# Patient Record
Sex: Male | Born: 1950
Health system: Southern US, Community
[De-identification: ages and names within clinical notes are randomized; demographics above are authoritative.]

## PROBLEM LIST (undated history)

## (undated) ENCOUNTER — Emergency Department (HOSPITAL_COMMUNITY): Admission: EM | Payer: No Typology Code available for payment source | Source: Home / Self Care

## (undated) DIAGNOSIS — Z860100 Personal history of colon polyps, unspecified: Secondary | ICD-10-CM

## (undated) DIAGNOSIS — H269 Unspecified cataract: Secondary | ICD-10-CM

## (undated) DIAGNOSIS — F32A Depression, unspecified: Secondary | ICD-10-CM

## (undated) DIAGNOSIS — R609 Edema, unspecified: Secondary | ICD-10-CM

## (undated) DIAGNOSIS — E785 Hyperlipidemia, unspecified: Secondary | ICD-10-CM

## (undated) DIAGNOSIS — F419 Anxiety disorder, unspecified: Secondary | ICD-10-CM

## (undated) DIAGNOSIS — I359 Nonrheumatic aortic valve disorder, unspecified: Secondary | ICD-10-CM

## (undated) DIAGNOSIS — J449 Chronic obstructive pulmonary disease, unspecified: Secondary | ICD-10-CM

## (undated) DIAGNOSIS — Z87442 Personal history of urinary calculi: Secondary | ICD-10-CM

## (undated) DIAGNOSIS — K219 Gastro-esophageal reflux disease without esophagitis: Secondary | ICD-10-CM

## (undated) DIAGNOSIS — E119 Type 2 diabetes mellitus without complications: Secondary | ICD-10-CM

## (undated) DIAGNOSIS — R0600 Dyspnea, unspecified: Secondary | ICD-10-CM

## (undated) DIAGNOSIS — M1990 Unspecified osteoarthritis, unspecified site: Secondary | ICD-10-CM

## (undated) DIAGNOSIS — N183 Chronic kidney disease, stage 3 unspecified: Secondary | ICD-10-CM

## (undated) DIAGNOSIS — G20 Parkinson's disease: Secondary | ICD-10-CM

## (undated) DIAGNOSIS — E039 Hypothyroidism, unspecified: Secondary | ICD-10-CM

## (undated) DIAGNOSIS — R0902 Hypoxemia: Secondary | ICD-10-CM

## (undated) DIAGNOSIS — G4733 Obstructive sleep apnea (adult) (pediatric): Secondary | ICD-10-CM

## (undated) DIAGNOSIS — K7460 Unspecified cirrhosis of liver: Secondary | ICD-10-CM

## (undated) DIAGNOSIS — I10 Essential (primary) hypertension: Secondary | ICD-10-CM

## (undated) DIAGNOSIS — G4730 Sleep apnea, unspecified: Secondary | ICD-10-CM

## (undated) DIAGNOSIS — I639 Cerebral infarction, unspecified: Secondary | ICD-10-CM

## (undated) DIAGNOSIS — G709 Myoneural disorder, unspecified: Secondary | ICD-10-CM

## (undated) DIAGNOSIS — R011 Cardiac murmur, unspecified: Secondary | ICD-10-CM

## (undated) DIAGNOSIS — H81399 Other peripheral vertigo, unspecified ear: Secondary | ICD-10-CM

## (undated) DIAGNOSIS — C44509 Unspecified malignant neoplasm of skin of other part of trunk: Secondary | ICD-10-CM

## (undated) DIAGNOSIS — T7840XA Allergy, unspecified, initial encounter: Secondary | ICD-10-CM

## (undated) DIAGNOSIS — G20C Parkinsonism, unspecified: Secondary | ICD-10-CM

## (undated) DIAGNOSIS — D469 Myelodysplastic syndrome, unspecified: Secondary | ICD-10-CM

## (undated) DIAGNOSIS — R600 Localized edema: Secondary | ICD-10-CM

## (undated) DIAGNOSIS — J45909 Unspecified asthma, uncomplicated: Secondary | ICD-10-CM

## (undated) HISTORY — DX: Depression, unspecified: F32.A

## (undated) HISTORY — DX: Unspecified asthma, uncomplicated: J45.909

## (undated) HISTORY — DX: Nonrheumatic aortic valve disorder, unspecified: I35.9

## (undated) HISTORY — DX: Hypothyroidism, unspecified: E03.9

## (undated) HISTORY — DX: Anxiety disorder, unspecified: F41.9

## (undated) HISTORY — DX: Essential (primary) hypertension: I10

## (undated) HISTORY — DX: Hypoxemia: R09.02

## (undated) HISTORY — PX: CHOLECYSTECTOMY: SHX55

## (undated) HISTORY — PX: ANKLE SURGERY: SHX546

## (undated) HISTORY — DX: Parkinsonism, unspecified: G20.C

## (undated) HISTORY — PX: EYE SURGERY: SHX253

## (undated) HISTORY — DX: Unspecified malignant neoplasm of skin of other part of trunk: C44.509

## (undated) HISTORY — DX: Unspecified cirrhosis of liver: K74.60

## (undated) HISTORY — DX: Type 2 diabetes mellitus without complications: E11.9

## (undated) HISTORY — DX: Gastro-esophageal reflux disease without esophagitis: K21.9

## (undated) HISTORY — DX: Sleep apnea, unspecified: G47.30

## (undated) HISTORY — PX: KIDNEY STONE SURGERY: SHX686

## (undated) HISTORY — PX: WISDOM TOOTH EXTRACTION: SHX21

## (undated) HISTORY — DX: Other peripheral vertigo, unspecified ear: H81.399

## (undated) HISTORY — DX: Hyperlipidemia, unspecified: E78.5

## (undated) HISTORY — DX: Personal history of colonic polyps: Z86.010

## (undated) HISTORY — DX: Myelodysplastic syndrome, unspecified: D46.9

## (undated) HISTORY — DX: Obstructive sleep apnea (adult) (pediatric): G47.33

## (undated) HISTORY — DX: Chronic kidney disease, stage 3 unspecified: N18.30

## (undated) HISTORY — DX: Parkinson's disease: G20

## (undated) HISTORY — DX: Myoneural disorder, unspecified: G70.9

## (undated) HISTORY — DX: Unspecified osteoarthritis, unspecified site: M19.90

## (undated) HISTORY — DX: Unspecified cataract: H26.9

## (undated) HISTORY — DX: Personal history of colon polyps, unspecified: Z86.0100

## (undated) HISTORY — DX: Allergy, unspecified, initial encounter: T78.40XA

## (undated) HISTORY — PX: HERNIA REPAIR: SHX51

---

## 2003-11-29 ENCOUNTER — Ambulatory Visit (HOSPITAL_COMMUNITY): Admission: RE | Admit: 2003-11-29 | Discharge: 2003-11-29 | Payer: Self-pay | Admitting: Urology

## 2003-11-29 ENCOUNTER — Ambulatory Visit (HOSPITAL_BASED_OUTPATIENT_CLINIC_OR_DEPARTMENT_OTHER): Admission: RE | Admit: 2003-11-29 | Discharge: 2003-11-29 | Payer: Self-pay | Admitting: Urology

## 2003-12-04 ENCOUNTER — Ambulatory Visit (HOSPITAL_COMMUNITY): Admission: RE | Admit: 2003-12-04 | Discharge: 2003-12-04 | Payer: Self-pay | Admitting: Urology

## 2003-12-11 ENCOUNTER — Ambulatory Visit (HOSPITAL_COMMUNITY): Admission: RE | Admit: 2003-12-11 | Discharge: 2003-12-11 | Payer: Self-pay | Admitting: Urology

## 2005-11-20 IMAGING — XA IR US GUIDANCE
1 series · 7 of 7 positions shown · non-contrast
Comparison: none

CLINICAL DATA: 53 year old male with left ureteral obstruction resulting in hydronephrosis.  The patient has a history of previous stricture related to multiple stones.  Urology was unable to place a retrograde ureteral stent.
ULTRASOUND AND FLUOROSCOPICALLY GUIDED LEFT PERCUTANEOUS NEPHROSTOMY ? 11/29/03 
RADIOLOGIST:  Nation, Jovado.   
GUIDANCE:  Ultrasound and fluoroscopic.
COMPLICATIONS:  No immediate complications.
MEDICATIONS:  400 mg Ciprofloxacin IV, Versed and fentanyl IV sedation.
PROCEDURE/FINDINGS:
Informed consent was obtained from the patient.   Review of the recent CT from March 2003 was performed.  This demonstrates a small kidney with cortical atrophy.  At the time of CT, a ureteral stent was in place.  
The patient was positioned prone.  Limited ultrasound was performed of the left kidney demonstrating a hydronephrotic kidney with cortical atrophy and a small size.  The kidney is actually fairly lateral in the left retroperitoneum.  From a lateral posterior flank approach, a 22 gauge Chiba needle was utilized to puncture a dilated posterior lower pole calix.  Needle position was confirmed with ultrasound.  Contrast was injected to confirm position under fluoroscopy.  An 018 guidewire was advanced followed by an Accu-Stick transitional dilator.  The outer dilator allowed insertion of a 035 J guidewire.  Serial dilatation was performed to accept a 10 French pigtail drainage catheter formed in the renal pelvis.  The left urinary system was decompressed.  Contrast was injected to confirm position.  
During placement, the catheter was noted to overlie an air-distended left colon.  However, in the lateral projection, the catheter is posterior to the colon.  
IMPRESSION
1.  Uncomplicated left percutaneous nephrostomy catheter for obstructive hydronephrosis with insertion of a 10 French drain.
PLAN:  The patient will be observed for one hour and if remains stable discharged.

[Series 1000: run · 0.16mm/px · 7 of 7 slices shown]
[im 1/7]
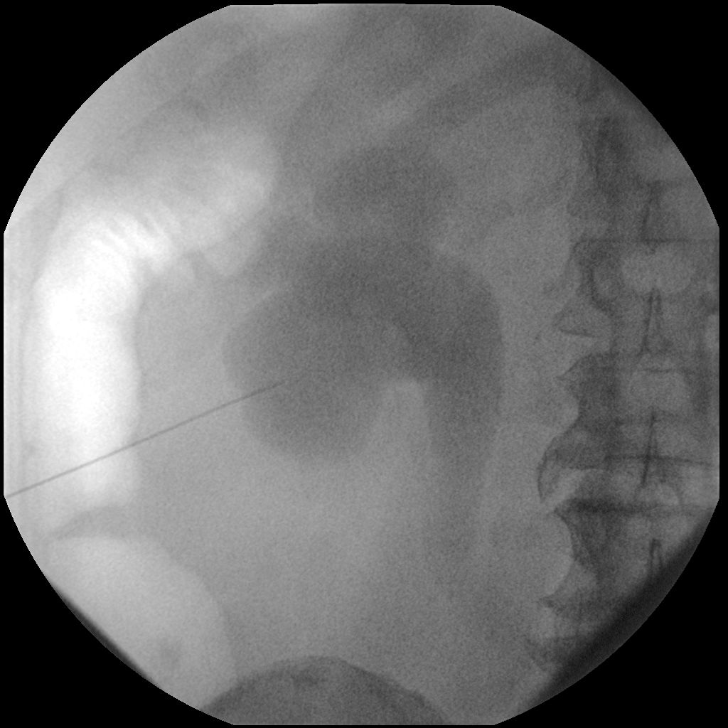
[im 2/7]
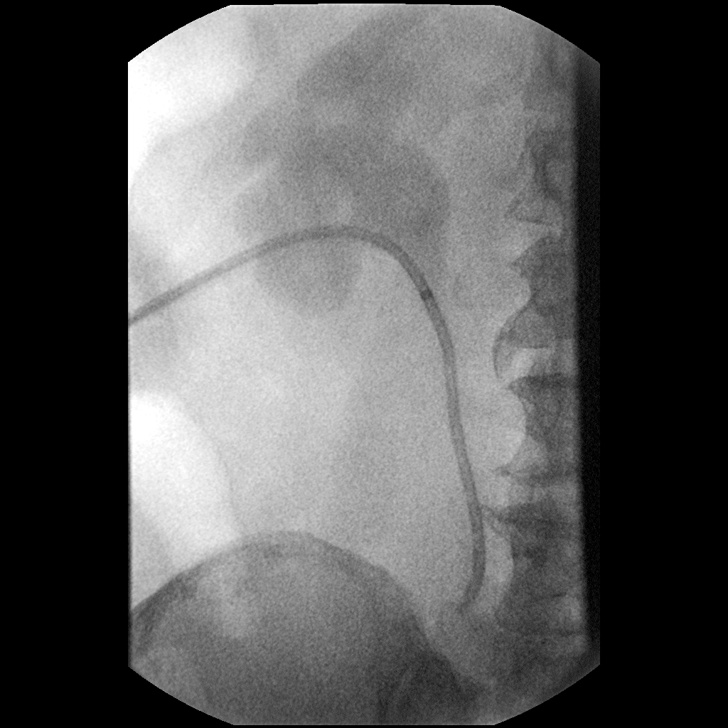
[im 3/7]
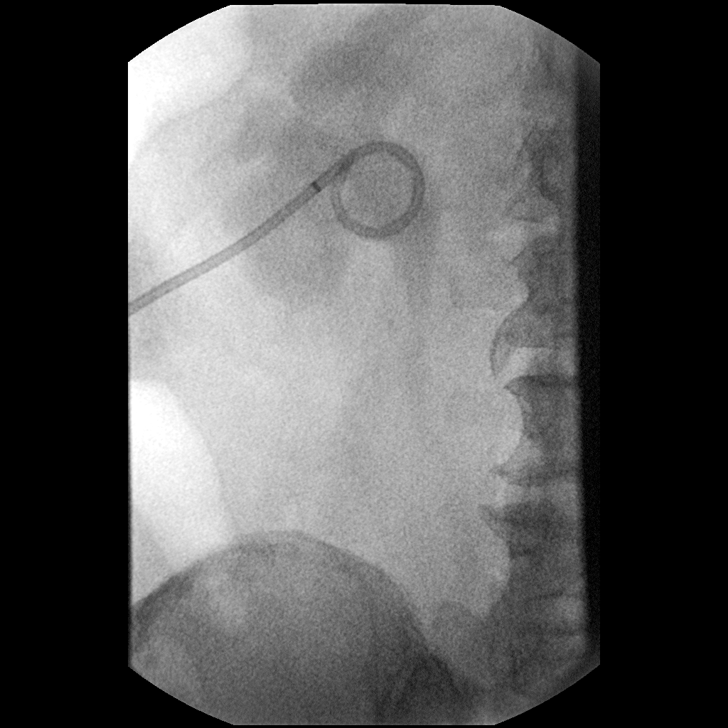
[im 4/7]
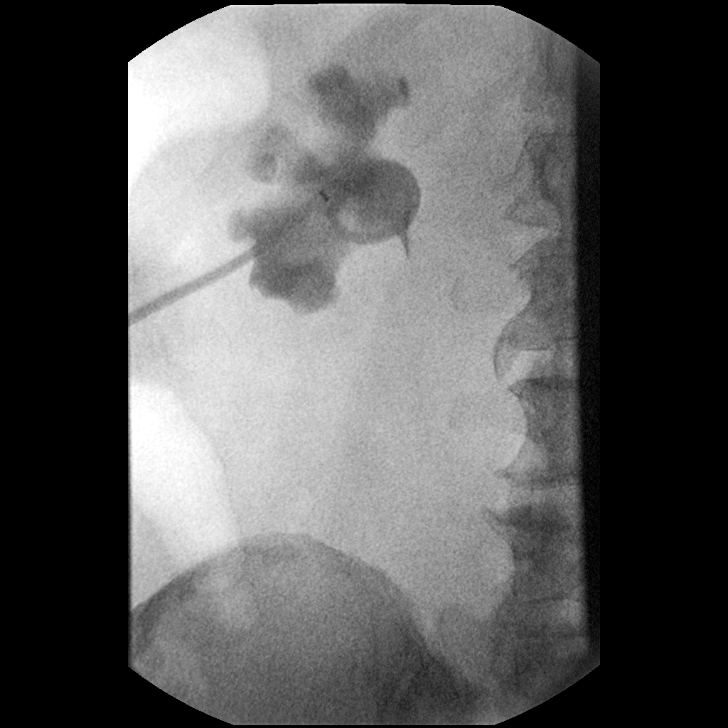
[im 5/7]
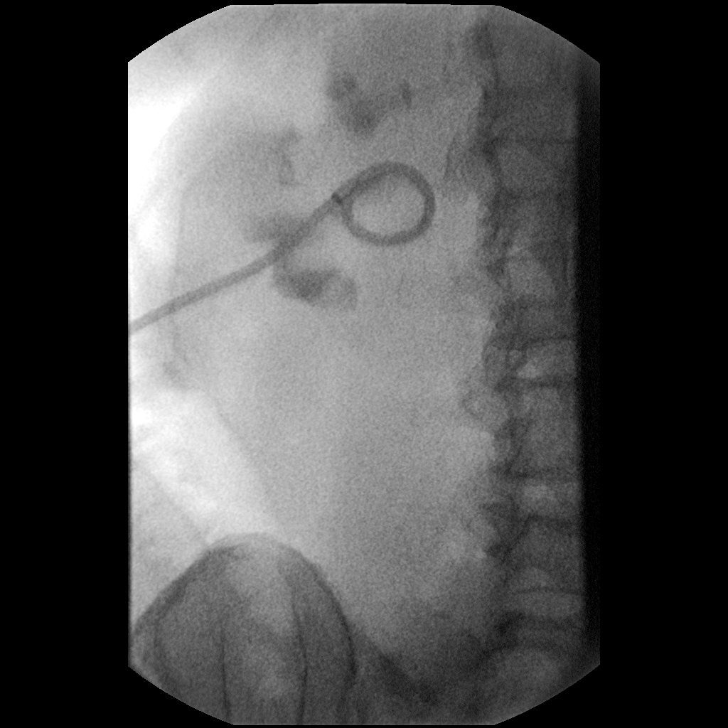
[im 6/7]
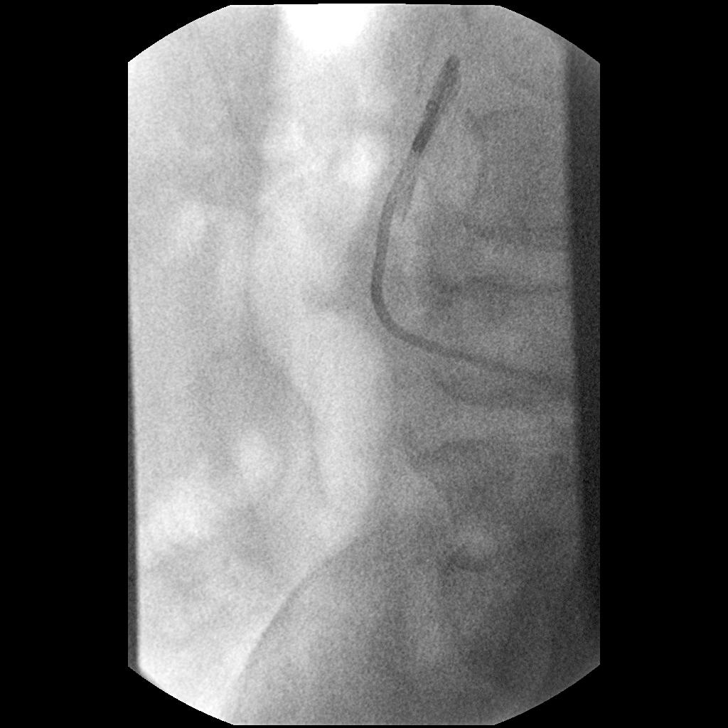
[im 7/7]
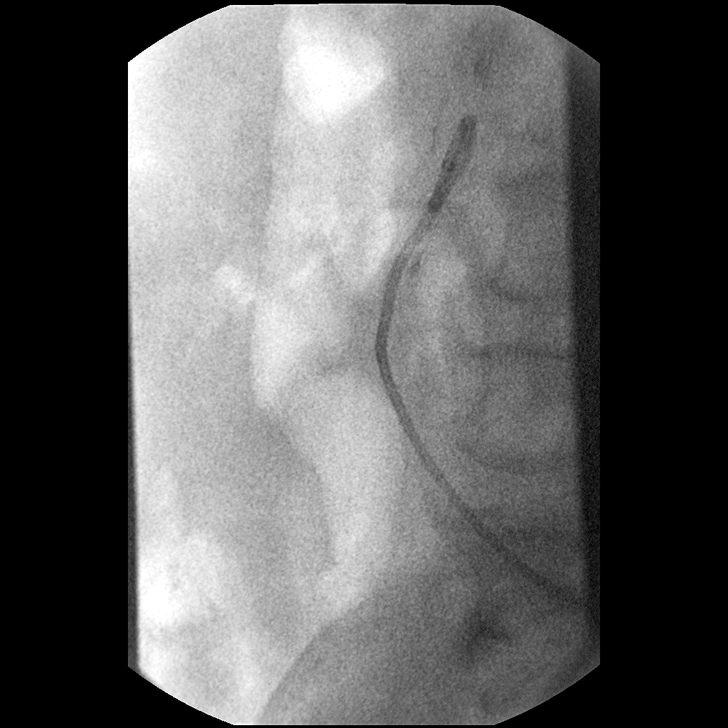

[7 of 7 positions shown; findings below may reference images not displayed]

## 2015-11-24 DIAGNOSIS — H5352 Acquired color vision deficiency: Secondary | ICD-10-CM | POA: Insufficient documentation

## 2015-11-24 DIAGNOSIS — H2511 Age-related nuclear cataract, right eye: Secondary | ICD-10-CM | POA: Insufficient documentation

## 2015-11-24 DIAGNOSIS — E119 Type 2 diabetes mellitus without complications: Secondary | ICD-10-CM | POA: Insufficient documentation

## 2015-11-24 DIAGNOSIS — E1122 Type 2 diabetes mellitus with diabetic chronic kidney disease: Secondary | ICD-10-CM | POA: Insufficient documentation

## 2015-11-24 DIAGNOSIS — Z794 Long term (current) use of insulin: Secondary | ICD-10-CM | POA: Insufficient documentation

## 2015-11-24 DIAGNOSIS — H40023 Open angle with borderline findings, high risk, bilateral: Secondary | ICD-10-CM | POA: Insufficient documentation

## 2016-01-13 DIAGNOSIS — H2181 Floppy iris syndrome: Secondary | ICD-10-CM | POA: Insufficient documentation

## 2016-01-13 DIAGNOSIS — H5703 Miosis: Secondary | ICD-10-CM | POA: Insufficient documentation

## 2016-09-15 DIAGNOSIS — Z961 Presence of intraocular lens: Secondary | ICD-10-CM | POA: Insufficient documentation

## 2016-09-15 DIAGNOSIS — H2511 Age-related nuclear cataract, right eye: Secondary | ICD-10-CM | POA: Insufficient documentation

## 2017-09-26 DIAGNOSIS — T670XXA Heatstroke and sunstroke, initial encounter: Secondary | ICD-10-CM | POA: Diagnosis not present

## 2017-09-27 DIAGNOSIS — D709 Neutropenia, unspecified: Secondary | ICD-10-CM | POA: Diagnosis not present

## 2017-09-27 DIAGNOSIS — R509 Fever, unspecified: Secondary | ICD-10-CM | POA: Diagnosis not present

## 2017-09-27 DIAGNOSIS — N12 Tubulo-interstitial nephritis, not specified as acute or chronic: Secondary | ICD-10-CM

## 2017-09-27 DIAGNOSIS — N1330 Unspecified hydronephrosis: Secondary | ICD-10-CM

## 2017-09-27 DIAGNOSIS — G250 Essential tremor: Secondary | ICD-10-CM

## 2017-09-27 DIAGNOSIS — R55 Syncope and collapse: Secondary | ICD-10-CM | POA: Diagnosis not present

## 2017-09-27 DIAGNOSIS — T670XXA Heatstroke and sunstroke, initial encounter: Secondary | ICD-10-CM | POA: Diagnosis not present

## 2017-09-27 DIAGNOSIS — G4733 Obstructive sleep apnea (adult) (pediatric): Secondary | ICD-10-CM

## 2017-09-28 DIAGNOSIS — R55 Syncope and collapse: Secondary | ICD-10-CM | POA: Diagnosis not present

## 2017-09-28 DIAGNOSIS — T670XXA Heatstroke and sunstroke, initial encounter: Secondary | ICD-10-CM | POA: Diagnosis not present

## 2017-09-28 DIAGNOSIS — R509 Fever, unspecified: Secondary | ICD-10-CM | POA: Diagnosis not present

## 2017-09-29 DIAGNOSIS — R55 Syncope and collapse: Secondary | ICD-10-CM | POA: Diagnosis not present

## 2017-09-29 DIAGNOSIS — R509 Fever, unspecified: Secondary | ICD-10-CM | POA: Diagnosis not present

## 2017-09-29 DIAGNOSIS — T670XXA Heatstroke and sunstroke, initial encounter: Secondary | ICD-10-CM | POA: Diagnosis not present

## 2017-09-30 DIAGNOSIS — D61818 Other pancytopenia: Secondary | ICD-10-CM | POA: Diagnosis not present

## 2017-09-30 DIAGNOSIS — R509 Fever, unspecified: Secondary | ICD-10-CM | POA: Diagnosis not present

## 2017-09-30 DIAGNOSIS — R161 Splenomegaly, not elsewhere classified: Secondary | ICD-10-CM

## 2017-09-30 DIAGNOSIS — D469 Myelodysplastic syndrome, unspecified: Secondary | ICD-10-CM | POA: Diagnosis not present

## 2017-09-30 DIAGNOSIS — N189 Chronic kidney disease, unspecified: Secondary | ICD-10-CM | POA: Diagnosis not present

## 2017-09-30 DIAGNOSIS — R55 Syncope and collapse: Secondary | ICD-10-CM | POA: Diagnosis not present

## 2017-09-30 DIAGNOSIS — R1012 Left upper quadrant pain: Secondary | ICD-10-CM

## 2017-09-30 DIAGNOSIS — T670XXA Heatstroke and sunstroke, initial encounter: Secondary | ICD-10-CM | POA: Diagnosis not present

## 2017-09-30 DIAGNOSIS — D709 Neutropenia, unspecified: Secondary | ICD-10-CM | POA: Diagnosis not present

## 2017-10-01 DIAGNOSIS — T670XXA Heatstroke and sunstroke, initial encounter: Secondary | ICD-10-CM | POA: Diagnosis not present

## 2017-10-01 DIAGNOSIS — N189 Chronic kidney disease, unspecified: Secondary | ICD-10-CM

## 2017-10-01 DIAGNOSIS — R55 Syncope and collapse: Secondary | ICD-10-CM | POA: Diagnosis not present

## 2017-10-01 DIAGNOSIS — E871 Hypo-osmolality and hyponatremia: Secondary | ICD-10-CM

## 2017-10-01 DIAGNOSIS — N12 Tubulo-interstitial nephritis, not specified as acute or chronic: Secondary | ICD-10-CM | POA: Diagnosis not present

## 2017-10-01 DIAGNOSIS — R509 Fever, unspecified: Secondary | ICD-10-CM | POA: Diagnosis not present

## 2017-10-01 DIAGNOSIS — D709 Neutropenia, unspecified: Secondary | ICD-10-CM | POA: Diagnosis not present

## 2017-10-01 DIAGNOSIS — N1330 Unspecified hydronephrosis: Secondary | ICD-10-CM | POA: Diagnosis not present

## 2017-10-01 DIAGNOSIS — D61818 Other pancytopenia: Secondary | ICD-10-CM | POA: Diagnosis not present

## 2017-10-01 DIAGNOSIS — D469 Myelodysplastic syndrome, unspecified: Secondary | ICD-10-CM | POA: Diagnosis not present

## 2017-10-02 DIAGNOSIS — R509 Fever, unspecified: Secondary | ICD-10-CM | POA: Diagnosis not present

## 2017-10-02 DIAGNOSIS — D61818 Other pancytopenia: Secondary | ICD-10-CM | POA: Diagnosis not present

## 2017-10-02 DIAGNOSIS — R55 Syncope and collapse: Secondary | ICD-10-CM | POA: Diagnosis not present

## 2017-10-02 DIAGNOSIS — T670XXA Heatstroke and sunstroke, initial encounter: Secondary | ICD-10-CM | POA: Diagnosis not present

## 2017-10-02 DIAGNOSIS — D469 Myelodysplastic syndrome, unspecified: Secondary | ICD-10-CM | POA: Diagnosis not present

## 2017-10-02 DIAGNOSIS — N12 Tubulo-interstitial nephritis, not specified as acute or chronic: Secondary | ICD-10-CM | POA: Diagnosis not present

## 2017-10-02 DIAGNOSIS — D696 Thrombocytopenia, unspecified: Secondary | ICD-10-CM | POA: Diagnosis not present

## 2017-10-02 DIAGNOSIS — D709 Neutropenia, unspecified: Secondary | ICD-10-CM | POA: Diagnosis not present

## 2017-10-03 DIAGNOSIS — D709 Neutropenia, unspecified: Secondary | ICD-10-CM | POA: Diagnosis not present

## 2017-10-03 DIAGNOSIS — T670XXA Heatstroke and sunstroke, initial encounter: Secondary | ICD-10-CM | POA: Diagnosis not present

## 2017-10-03 DIAGNOSIS — R509 Fever, unspecified: Secondary | ICD-10-CM | POA: Diagnosis not present

## 2017-10-03 DIAGNOSIS — R55 Syncope and collapse: Secondary | ICD-10-CM | POA: Diagnosis not present

## 2017-10-04 DIAGNOSIS — D61810 Antineoplastic chemotherapy induced pancytopenia: Secondary | ICD-10-CM | POA: Diagnosis not present

## 2017-10-04 DIAGNOSIS — D709 Neutropenia, unspecified: Secondary | ICD-10-CM | POA: Diagnosis not present

## 2017-10-04 DIAGNOSIS — T670XXA Heatstroke and sunstroke, initial encounter: Secondary | ICD-10-CM | POA: Diagnosis not present

## 2017-10-04 DIAGNOSIS — R55 Syncope and collapse: Secondary | ICD-10-CM | POA: Diagnosis not present

## 2017-10-04 DIAGNOSIS — D469 Myelodysplastic syndrome, unspecified: Secondary | ICD-10-CM | POA: Diagnosis not present

## 2017-10-04 DIAGNOSIS — R509 Fever, unspecified: Secondary | ICD-10-CM | POA: Diagnosis not present

## 2018-10-02 DIAGNOSIS — R079 Chest pain, unspecified: Secondary | ICD-10-CM | POA: Diagnosis not present

## 2018-10-02 DIAGNOSIS — I10 Essential (primary) hypertension: Secondary | ICD-10-CM | POA: Diagnosis not present

## 2018-10-02 DIAGNOSIS — E119 Type 2 diabetes mellitus without complications: Secondary | ICD-10-CM | POA: Diagnosis not present

## 2018-10-02 DIAGNOSIS — K219 Gastro-esophageal reflux disease without esophagitis: Secondary | ICD-10-CM | POA: Diagnosis not present

## 2018-10-03 DIAGNOSIS — E119 Type 2 diabetes mellitus without complications: Secondary | ICD-10-CM | POA: Diagnosis not present

## 2018-10-03 DIAGNOSIS — K219 Gastro-esophageal reflux disease without esophagitis: Secondary | ICD-10-CM | POA: Diagnosis not present

## 2018-10-03 DIAGNOSIS — R079 Chest pain, unspecified: Secondary | ICD-10-CM | POA: Diagnosis not present

## 2018-10-03 DIAGNOSIS — I10 Essential (primary) hypertension: Secondary | ICD-10-CM | POA: Diagnosis not present

## 2019-09-26 DIAGNOSIS — N1339 Other hydronephrosis: Secondary | ICD-10-CM | POA: Diagnosis not present

## 2019-09-26 DIAGNOSIS — K862 Cyst of pancreas: Secondary | ICD-10-CM | POA: Diagnosis not present

## 2019-09-26 DIAGNOSIS — K828 Other specified diseases of gallbladder: Secondary | ICD-10-CM | POA: Diagnosis not present

## 2019-09-26 DIAGNOSIS — A419 Sepsis, unspecified organism: Secondary | ICD-10-CM | POA: Diagnosis not present

## 2019-09-26 DIAGNOSIS — R6520 Severe sepsis without septic shock: Secondary | ICD-10-CM | POA: Diagnosis not present

## 2019-09-26 DIAGNOSIS — R945 Abnormal results of liver function studies: Secondary | ICD-10-CM | POA: Diagnosis not present

## 2019-09-26 DIAGNOSIS — N179 Acute kidney failure, unspecified: Secondary | ICD-10-CM | POA: Diagnosis not present

## 2019-09-26 DIAGNOSIS — R197 Diarrhea, unspecified: Secondary | ICD-10-CM | POA: Diagnosis not present

## 2019-09-26 DIAGNOSIS — K859 Acute pancreatitis without necrosis or infection, unspecified: Secondary | ICD-10-CM | POA: Diagnosis not present

## 2019-09-26 DIAGNOSIS — N19 Unspecified kidney failure: Secondary | ICD-10-CM | POA: Diagnosis not present

## 2019-09-26 DIAGNOSIS — R509 Fever, unspecified: Secondary | ICD-10-CM | POA: Diagnosis not present

## 2019-09-26 DIAGNOSIS — N132 Hydronephrosis with renal and ureteral calculous obstruction: Secondary | ICD-10-CM | POA: Diagnosis not present

## 2019-09-26 DIAGNOSIS — K8020 Calculus of gallbladder without cholecystitis without obstruction: Secondary | ICD-10-CM | POA: Diagnosis not present

## 2019-09-26 DIAGNOSIS — K8689 Other specified diseases of pancreas: Secondary | ICD-10-CM | POA: Diagnosis not present

## 2019-09-26 DIAGNOSIS — K858 Other acute pancreatitis without necrosis or infection: Secondary | ICD-10-CM | POA: Diagnosis not present

## 2019-09-27 DIAGNOSIS — D61818 Other pancytopenia: Secondary | ICD-10-CM | POA: Diagnosis not present

## 2019-09-27 DIAGNOSIS — K8309 Other cholangitis: Secondary | ICD-10-CM | POA: Diagnosis not present

## 2019-09-27 DIAGNOSIS — E860 Dehydration: Secondary | ICD-10-CM | POA: Diagnosis not present

## 2019-09-27 DIAGNOSIS — K859 Acute pancreatitis without necrosis or infection, unspecified: Secondary | ICD-10-CM | POA: Diagnosis not present

## 2019-09-27 DIAGNOSIS — A419 Sepsis, unspecified organism: Secondary | ICD-10-CM | POA: Diagnosis not present

## 2019-09-28 DIAGNOSIS — I129 Hypertensive chronic kidney disease with stage 1 through stage 4 chronic kidney disease, or unspecified chronic kidney disease: Secondary | ICD-10-CM | POA: Diagnosis not present

## 2019-09-28 DIAGNOSIS — N189 Chronic kidney disease, unspecified: Secondary | ICD-10-CM | POA: Diagnosis not present

## 2019-09-28 DIAGNOSIS — F17210 Nicotine dependence, cigarettes, uncomplicated: Secondary | ICD-10-CM | POA: Diagnosis not present

## 2019-09-28 DIAGNOSIS — N1330 Unspecified hydronephrosis: Secondary | ICD-10-CM | POA: Diagnosis not present

## 2019-09-28 DIAGNOSIS — R748 Abnormal levels of other serum enzymes: Secondary | ICD-10-CM | POA: Diagnosis not present

## 2019-09-28 DIAGNOSIS — K7460 Unspecified cirrhosis of liver: Secondary | ICD-10-CM | POA: Diagnosis not present

## 2019-09-28 DIAGNOSIS — N179 Acute kidney failure, unspecified: Secondary | ICD-10-CM | POA: Diagnosis not present

## 2019-09-28 DIAGNOSIS — K8020 Calculus of gallbladder without cholecystitis without obstruction: Secondary | ICD-10-CM | POA: Diagnosis not present

## 2019-09-28 DIAGNOSIS — K766 Portal hypertension: Secondary | ICD-10-CM | POA: Diagnosis not present

## 2019-09-28 DIAGNOSIS — Z743 Need for continuous supervision: Secondary | ICD-10-CM | POA: Diagnosis not present

## 2019-09-28 DIAGNOSIS — E785 Hyperlipidemia, unspecified: Secondary | ICD-10-CM | POA: Diagnosis not present

## 2019-09-28 DIAGNOSIS — K8592 Acute pancreatitis with infected necrosis, unspecified: Secondary | ICD-10-CM | POA: Diagnosis not present

## 2019-09-28 DIAGNOSIS — K851 Biliary acute pancreatitis without necrosis or infection: Secondary | ICD-10-CM | POA: Diagnosis not present

## 2019-09-28 DIAGNOSIS — K859 Acute pancreatitis without necrosis or infection, unspecified: Secondary | ICD-10-CM | POA: Diagnosis not present

## 2019-09-28 DIAGNOSIS — D61818 Other pancytopenia: Secondary | ICD-10-CM | POA: Diagnosis not present

## 2019-09-28 DIAGNOSIS — K8030 Calculus of bile duct with cholangitis, unspecified, without obstruction: Secondary | ICD-10-CM | POA: Diagnosis not present

## 2019-09-28 DIAGNOSIS — N132 Hydronephrosis with renal and ureteral calculous obstruction: Secondary | ICD-10-CM | POA: Diagnosis not present

## 2019-09-28 DIAGNOSIS — K8309 Other cholangitis: Secondary | ICD-10-CM | POA: Diagnosis not present

## 2019-10-23 DIAGNOSIS — N132 Hydronephrosis with renal and ureteral calculous obstruction: Secondary | ICD-10-CM | POA: Diagnosis not present

## 2019-10-23 DIAGNOSIS — K851 Biliary acute pancreatitis without necrosis or infection: Secondary | ICD-10-CM | POA: Diagnosis not present

## 2019-10-26 DIAGNOSIS — K859 Acute pancreatitis without necrosis or infection, unspecified: Secondary | ICD-10-CM | POA: Diagnosis not present

## 2019-10-26 DIAGNOSIS — K851 Biliary acute pancreatitis without necrosis or infection: Secondary | ICD-10-CM | POA: Diagnosis not present

## 2019-10-26 DIAGNOSIS — K7460 Unspecified cirrhosis of liver: Secondary | ICD-10-CM | POA: Diagnosis not present

## 2019-10-26 DIAGNOSIS — K8050 Calculus of bile duct without cholangitis or cholecystitis without obstruction: Secondary | ICD-10-CM | POA: Diagnosis not present

## 2019-10-26 DIAGNOSIS — K8309 Other cholangitis: Secondary | ICD-10-CM | POA: Diagnosis not present

## 2019-10-28 DIAGNOSIS — Z8719 Personal history of other diseases of the digestive system: Secondary | ICD-10-CM

## 2019-10-28 HISTORY — DX: Personal history of other diseases of the digestive system: Z87.19

## 2019-11-06 DIAGNOSIS — D61818 Other pancytopenia: Secondary | ICD-10-CM | POA: Diagnosis not present

## 2019-11-06 DIAGNOSIS — J90 Pleural effusion, not elsewhere classified: Secondary | ICD-10-CM | POA: Diagnosis not present

## 2019-11-06 DIAGNOSIS — K859 Acute pancreatitis without necrosis or infection, unspecified: Secondary | ICD-10-CM | POA: Diagnosis not present

## 2019-11-06 DIAGNOSIS — N132 Hydronephrosis with renal and ureteral calculous obstruction: Secondary | ICD-10-CM | POA: Diagnosis not present

## 2019-11-06 DIAGNOSIS — K861 Other chronic pancreatitis: Secondary | ICD-10-CM | POA: Diagnosis not present

## 2019-11-06 DIAGNOSIS — R1110 Vomiting, unspecified: Secondary | ICD-10-CM | POA: Diagnosis not present

## 2019-11-06 DIAGNOSIS — N134 Hydroureter: Secondary | ICD-10-CM | POA: Diagnosis not present

## 2019-11-15 DIAGNOSIS — R310 Gross hematuria: Secondary | ICD-10-CM | POA: Diagnosis not present

## 2019-11-15 DIAGNOSIS — R8289 Other abnormal findings on cytological and histological examination of urine: Secondary | ICD-10-CM | POA: Diagnosis not present

## 2019-11-22 DIAGNOSIS — J449 Chronic obstructive pulmonary disease, unspecified: Secondary | ICD-10-CM | POA: Diagnosis not present

## 2019-11-22 DIAGNOSIS — Z01818 Encounter for other preprocedural examination: Secondary | ICD-10-CM | POA: Diagnosis not present

## 2019-11-22 DIAGNOSIS — R310 Gross hematuria: Secondary | ICD-10-CM | POA: Diagnosis not present

## 2019-11-22 DIAGNOSIS — R001 Bradycardia, unspecified: Secondary | ICD-10-CM | POA: Diagnosis not present

## 2019-11-22 DIAGNOSIS — J90 Pleural effusion, not elsewhere classified: Secondary | ICD-10-CM | POA: Diagnosis not present

## 2019-11-22 DIAGNOSIS — I493 Ventricular premature depolarization: Secondary | ICD-10-CM | POA: Diagnosis not present

## 2019-11-22 DIAGNOSIS — K429 Umbilical hernia without obstruction or gangrene: Secondary | ICD-10-CM | POA: Diagnosis not present

## 2019-11-22 DIAGNOSIS — N189 Chronic kidney disease, unspecified: Secondary | ICD-10-CM | POA: Diagnosis not present

## 2019-11-22 DIAGNOSIS — K8064 Calculus of gallbladder and bile duct with chronic cholecystitis without obstruction: Secondary | ICD-10-CM | POA: Diagnosis not present

## 2019-11-22 DIAGNOSIS — I129 Hypertensive chronic kidney disease with stage 1 through stage 4 chronic kidney disease, or unspecified chronic kidney disease: Secondary | ICD-10-CM | POA: Diagnosis not present

## 2019-11-22 DIAGNOSIS — K660 Peritoneal adhesions (postprocedural) (postinfection): Secondary | ICD-10-CM | POA: Diagnosis not present

## 2019-11-22 DIAGNOSIS — D469 Myelodysplastic syndrome, unspecified: Secondary | ICD-10-CM | POA: Diagnosis not present

## 2019-11-22 DIAGNOSIS — R918 Other nonspecific abnormal finding of lung field: Secondary | ICD-10-CM | POA: Diagnosis not present

## 2019-11-22 DIAGNOSIS — N35912 Unspecified bulbous urethral stricture, male: Secondary | ICD-10-CM | POA: Diagnosis not present

## 2019-11-22 DIAGNOSIS — E1122 Type 2 diabetes mellitus with diabetic chronic kidney disease: Secondary | ICD-10-CM | POA: Diagnosis not present

## 2019-11-22 DIAGNOSIS — Z01810 Encounter for preprocedural cardiovascular examination: Secondary | ICD-10-CM | POA: Diagnosis not present

## 2019-11-22 DIAGNOSIS — K859 Acute pancreatitis without necrosis or infection, unspecified: Secondary | ICD-10-CM | POA: Diagnosis not present

## 2019-11-22 DIAGNOSIS — K7460 Unspecified cirrhosis of liver: Secondary | ICD-10-CM | POA: Diagnosis not present

## 2019-11-22 DIAGNOSIS — K811 Chronic cholecystitis: Secondary | ICD-10-CM | POA: Diagnosis not present

## 2019-11-23 DIAGNOSIS — J90 Pleural effusion, not elsewhere classified: Secondary | ICD-10-CM | POA: Diagnosis not present

## 2019-11-23 DIAGNOSIS — D469 Myelodysplastic syndrome, unspecified: Secondary | ICD-10-CM | POA: Diagnosis not present

## 2019-11-23 DIAGNOSIS — I129 Hypertensive chronic kidney disease with stage 1 through stage 4 chronic kidney disease, or unspecified chronic kidney disease: Secondary | ICD-10-CM | POA: Diagnosis not present

## 2019-11-23 DIAGNOSIS — K7460 Unspecified cirrhosis of liver: Secondary | ICD-10-CM | POA: Diagnosis not present

## 2019-11-23 DIAGNOSIS — K811 Chronic cholecystitis: Secondary | ICD-10-CM | POA: Diagnosis not present

## 2019-11-23 DIAGNOSIS — K429 Umbilical hernia without obstruction or gangrene: Secondary | ICD-10-CM | POA: Diagnosis not present

## 2019-11-23 DIAGNOSIS — E1122 Type 2 diabetes mellitus with diabetic chronic kidney disease: Secondary | ICD-10-CM | POA: Diagnosis not present

## 2019-11-23 DIAGNOSIS — K660 Peritoneal adhesions (postprocedural) (postinfection): Secondary | ICD-10-CM | POA: Diagnosis not present

## 2019-11-23 DIAGNOSIS — N189 Chronic kidney disease, unspecified: Secondary | ICD-10-CM | POA: Diagnosis not present

## 2019-11-28 DIAGNOSIS — N35919 Unspecified urethral stricture, male, unspecified site: Secondary | ICD-10-CM | POA: Diagnosis not present

## 2019-12-07 DIAGNOSIS — Z4803 Encounter for change or removal of drains: Secondary | ICD-10-CM | POA: Diagnosis not present

## 2019-12-07 DIAGNOSIS — K859 Acute pancreatitis without necrosis or infection, unspecified: Secondary | ICD-10-CM | POA: Diagnosis not present

## 2019-12-07 DIAGNOSIS — K8309 Other cholangitis: Secondary | ICD-10-CM | POA: Diagnosis not present

## 2019-12-07 DIAGNOSIS — K8050 Calculus of bile duct without cholangitis or cholecystitis without obstruction: Secondary | ICD-10-CM | POA: Diagnosis not present

## 2019-12-28 DIAGNOSIS — K859 Acute pancreatitis without necrosis or infection, unspecified: Secondary | ICD-10-CM | POA: Diagnosis not present

## 2019-12-28 DIAGNOSIS — K8050 Calculus of bile duct without cholangitis or cholecystitis without obstruction: Secondary | ICD-10-CM | POA: Diagnosis not present

## 2020-01-15 ENCOUNTER — Other Ambulatory Visit: Payer: Self-pay

## 2020-01-15 NOTE — Progress Notes (Signed)
New patient paperwork from [place]

## 2020-01-28 ENCOUNTER — Ambulatory Visit (INDEPENDENT_AMBULATORY_CARE_PROVIDER_SITE_OTHER): Payer: No Typology Code available for payment source | Admitting: Gastroenterology

## 2020-01-28 ENCOUNTER — Encounter: Payer: Self-pay | Admitting: Gastroenterology

## 2020-01-28 ENCOUNTER — Other Ambulatory Visit (INDEPENDENT_AMBULATORY_CARE_PROVIDER_SITE_OTHER): Payer: No Typology Code available for payment source

## 2020-01-28 VITALS — BP 124/78 | HR 70 | Ht 67.0 in | Wt 187.0 lb

## 2020-01-28 DIAGNOSIS — R933 Abnormal findings on diagnostic imaging of other parts of digestive tract: Secondary | ICD-10-CM | POA: Diagnosis not present

## 2020-01-28 DIAGNOSIS — K7460 Unspecified cirrhosis of liver: Secondary | ICD-10-CM

## 2020-01-28 DIAGNOSIS — K625 Hemorrhage of anus and rectum: Secondary | ICD-10-CM

## 2020-01-28 DIAGNOSIS — Z8719 Personal history of other diseases of the digestive system: Secondary | ICD-10-CM | POA: Diagnosis not present

## 2020-01-28 LAB — COMPREHENSIVE METABOLIC PANEL
ALT: 12 U/L (ref 0–53)
AST: 17 U/L (ref 0–37)
Albumin: 3.5 g/dL (ref 3.5–5.2)
Alkaline Phosphatase: 116 U/L (ref 39–117)
BUN: 18 mg/dL (ref 6–23)
CO2: 29 mEq/L (ref 19–32)
Calcium: 9.4 mg/dL (ref 8.4–10.5)
Chloride: 103 mEq/L (ref 96–112)
Creatinine, Ser: 1.51 mg/dL — ABNORMAL HIGH (ref 0.40–1.50)
GFR: 46.17 mL/min — ABNORMAL LOW (ref >60.00)
Glucose, Bld: 178 mg/dL — ABNORMAL HIGH (ref 70–99)
Potassium: 4.2 mEq/L (ref 3.5–5.1)
Sodium: 139 mEq/L (ref 135–145)
Total Bilirubin: 0.5 mg/dL (ref 0.2–1.2)
Total Protein: 8 g/dL (ref 6.0–8.3)

## 2020-01-28 LAB — CBC WITH DIFFERENTIAL/PLATELET
Basophils Absolute: 0 10*3/uL (ref 0.0–0.1)
Basophils Relative: 1.7 % (ref 0.0–3.0)
Eosinophils Absolute: 0.1 10*3/uL (ref 0.0–0.7)
Eosinophils Relative: 7.2 % — ABNORMAL HIGH (ref 0.0–5.0)
HCT: 30.2 % — ABNORMAL LOW (ref 39.0–52.0)
Hemoglobin: 10.3 g/dL — ABNORMAL LOW (ref 13.0–17.0)
Lymphocytes Relative: 22.1 % (ref 12.0–46.0)
Lymphs Abs: 0.5 10*3/uL — ABNORMAL LOW (ref 0.7–4.0)
MCHC: 34.2 g/dL (ref 30.0–36.0)
MCV: 92.1 fl (ref 78.0–100.0)
Monocytes Absolute: 0.1 10*3/uL (ref 0.1–1.0)
Monocytes Relative: 5.6 % (ref 3.0–12.0)
Neutro Abs: 1.3 10*3/uL — ABNORMAL LOW (ref 1.4–7.7)
Neutrophils Relative %: 63.4 % (ref 43.0–77.0)
Platelets: 72 10*3/uL — ABNORMAL LOW (ref 150.0–400.0)
RBC: 3.28 Mil/uL — ABNORMAL LOW (ref 4.22–5.81)
RDW: 16.6 % — ABNORMAL HIGH (ref 11.5–15.5)
WBC: 2.1 10*3/uL — ABNORMAL LOW (ref 4.0–10.5)

## 2020-01-28 LAB — PROTIME-INR
INR: 1.2 ratio — ABNORMAL HIGH (ref 0.8–1.0)
Prothrombin Time: 13.1 s (ref 9.6–13.1)

## 2020-01-28 NOTE — Patient Instructions (Signed)
You have been scheduled for an endoscopy and colonoscopy. Please follow the written instructions given to you at your visit today. Please pick up your prep supplies at the pharmacy within the next 1-3 days. If you use inhalers (even only as needed), please bring them with you on the day of your procedure.  Your provider has requested that you go to the basement level for lab work before leaving today. Press "B" on the elevator. The lab is located at the first door on the left as you exit the elevator.  If you are age 109 or older, your body mass index should be between 23-30. Your Body mass index is 29.29 kg/m. If this is out of the aforementioned range listed, please consider follow up with your Primary Care Provider.  Due to recent changes in healthcare laws, you may see the results of your imaging and laboratory studies on MyChart before your provider has had a chance to review them.  We understand that in some cases there may be results that are confusing or concerning to you. Not all laboratory results come back in the same time frame and the provider may be waiting for multiple results in order to interpret others.  Please give [place] 48 hours in order for your provider to thoroughly review all the results before contacting the office for clarification of your results.

## 2020-01-28 NOTE — Progress Notes (Signed)
HPI :  69 year old male with a history of reported cirrhosis of the liver, history of gallstone pancreatitis status post cholecystectomy, reported history of MDS, diabetes[facility] Car[facility] Mul[facility] for abnormal CT scan, rectal bleeding, cirrhosis.  History obtained from the patient and his wife, as well as review of his records from the [place].  His referral is for consideration for endoscopy given abnormal imaging of his esophagus and colonoscopy for rectal bleeding.  On review of his history it looks like he was admitted to the hospital in [place] in July for abdominal pain.  He had a hyperbilirubinemia 4.7, ALT 98, lipase greater than 40,000.  Initial imaging showed gallstones in the gallbladder and a questionable stone in the bile duct.  He had a MRCP which showed pancreatitis and gallstones but no choledocholithiasis.  Incidentally he was noted to have cirrhosis of the liver.  He was transferred to the [facility] and was there from July 2 to 10-11-2022.  They thought he had passed a stone, ultimately had downtrending of his LFTs and was treated conservatively.  He said he subsequently had his gallbladder removed in July and has recovered from that hospitalization.  During this time he had CT scan evidence of diffuse esophageal wall thickening and suggestion of portal hypertension with esophageal varices.  He is not aware of any history of any underlying liver disease.  He is aware that he was told he had cirrhosis incidentally and this is a new diagnosis for him.  He did have jaundice when he was hospitalized with gallstones but otherwise has not complained of any jaundice historically, no ascites, no reported history of hepatic encephalopathy or variceal bleeding.  He did not have any family history of liver disease.  He denies any history of significant alcohol use.  He drinks about 2 alcoholic beverages per year.  I do not have any prior labs to clarify what work-up he has had  done for his cirrhosis.  He has never had a prior EGD.  He does take omeprazole chronically and states that works to control his reflux symptoms but he does have belching that occasionally bothers him.  He does also have occasional dysphagia but states that is mostly due to his dry mouth at times when he has a hard time chewing.  He otherwise states he had rectal bleeding with blood in his stool this past summer.  He states it occurred in the setting of passing hard dry stools when he was constipated.  More recently he is really not been constipated at all, he is not taking anything for his bowels but the rectal bleeding stopped this past summer.  He has no abdominal pains that bother him.  His weight has been going up after his course of pancreatitis and is doing pretty well.  He denies any family history of colon cancer.  He reportedly had a colonoscopy in 2015 which was normal.  He does endorse having history of MDS based on 2 bone marrow biopsies.  He endorses chronic anemia, I do not have any evidence of labs other than this past June at which time his Hgb was 11.  Prior platelet count reported to be in the 70s with a hemoglobin in the nines.  He states he is seeing transplant hepatology tomorrow at [facility] to be evaluated for his cirrhosis.  He denies any history of heart disease.  No cardiopulmonary symptoms.  He denies any problems with receiving anesthesia in  the past.   CT 08/29/19 - suspected cirrhosis with suggestion of portal hypertension and varices, splenomegaly, "increasing diffuse esophageal wall thickening"  CT 10/23/19 - improved peripancreatic inflammation, stable severe left sided hydronephrosis secondary to left ureteral stone  Colonoscopy 12/06/2013 - large internal hemorrhoids, no polyps  Labs from September 13, 2019 at the [place].  Hemoglobin 11.1, platelets 70, white cell count 3.4.  ALT 21, AST 15, T bili 0.6      Past Medical History:  Diagnosis Date  . Anxiety    . Aortic valve disorder   . Asthma   . Cirrhosis ([place])   . CKD (chronic kidney disease), stage III ([place])   . DM (diabetes mellitus) ([place])   . GERD (gastroesophageal reflux disease)   . History of colon polyps   . Hx of acute pancreatitis 10/2019  . Hypertension   . Hypothyroidism   . Myelodysplastic syndrome ([place])   . Obstructive sleep apnea   . Peripheral positional vertigo      Past Surgical History:  Procedure Laterality Date  . ANKLE SURGERY    . CHOLECYSTECTOMY    . KIDNEY STONE SURGERY     Family History  Problem Relation Age of Onset  . Liver cancer Mother   . Colon cancer Neg Hx   . Stomach cancer Neg Hx    Social History   Tobacco Use  . Smoking status: Never Smoker  . Smokeless tobacco: Never Used  Vaping Use  . Vaping Use: Never used  Substance Use Topics  . Alcohol use: Not Currently  . Drug use: Not Currently   Current Outpatient Medications  Medication Sig Dispense Refill  . albuterol (VENTOLIN HFA) 108 (90 Base) MCG/ACT inhaler Inhale into the lungs.    . ARTIFICIAL SALIVA MT Use as directed 4 sprays in the mouth or throat as needed.    [name] Kitchen aspirin 81 MG chewable tablet Chew by mouth.    [name] Kitchen atorvastatin (LIPITOR) 10 MG tablet Take 40 mg by mouth at bedtime.    . budesonide-formoterol (SYMBICORT) 160-4.5 MCG/ACT inhaler Inhale 2 puffs into the lungs 2 (two) times daily.    [name] Kitchen buPROPion (WELLBUTRIN XL) 150 MG 24 hr tablet Take 150 mg by mouth daily.    . cefUROXime (CEFTIN) 250 MG tablet Take 250 mg by mouth 2 (two) times daily as needed.    . cetirizine (ZYRTEC) 10 MG chewable tablet Chew by mouth.    . citalopram (CELEXA) 40 MG tablet Take by mouth.    . docusate sodium (COLACE) 100 MG capsule Take 200 mg by mouth daily.    . ferrous sulfate 325 (65 FE) MG tablet Take 325 mg by mouth. Monday Wednesday and Friday    . fluticasone (FLONASE) 50 MCG/ACT nasal spray Place 2 sprays into both nostrils daily.    [name] Kitchen HM LIDOCAINE PATCH EX Apply 1 patch topically  every 12 (twelve) hours. 5%    . Hydrocortisone Acetate 10 % FOAM Apply 1 applicator topically daily.    . hydrophilic ointment Apply topically as needed for dry skin.    . Insulin Glargine (BASAGLAR KWIKPEN) 100 UNIT/ML Inject 10 Units into the skin daily.    [name] Kitchen ipratropium (ATROVENT) 0.03 % nasal spray Place 2 sprays into both nostrils daily.    [name] Kitchen losartan (COZAAR) 100 MG tablet Take by mouth.    . montelukast (SINGULAIR) 10 MG tablet Take by mouth.    [name] Kitchen omeprazole (PRILOSEC OTC) 20 MG tablet Take 20 mg by mouth daily.    [name] Kitchen  potassium citrate (UROCIT-K) 10 MEQ (1080 MG) SR tablet Take by mouth.    . tamsulosin (FLOMAX) 0.4 MG CAPS capsule Take 2 capsules by mouth daily.     No current facility-administered medications for this visit.   Allergies  Allergen Reactions  . Lisinopril      Review of Systems: All systems reviewed and negative except where noted in HPI.   Labs per HPI  Physical Exam: BP 124/78   Pulse 70   Ht 5\' 7"  (1.702 m)   Wt 187 lb (84.8 kg)   BMI 29.29 kg/m  Constitutional: Pleasant,well-developed, male in no acute distress. HEENT: Normocephalic and atraumatic. Conjunctivae are normal. No scleral icterus. Neck supple.  Cardiovascular: Normal rate, regular rhythm. 2/6 SEM Pulmonary/chest: Effort normal and breath sounds normal.  Abdominal: Soft, nondistended, nontender. There are no masses palpable.  Extremities: no edema Lymphadenopathy: No cervical adenopathy noted. Neurological: Alert and oriented to person place and time. Skin: Skin is warm and dry. No rashes noted. Psychiatric: Normal mood and affect. Behavior is normal.   ASSESSMENT AND PLAN: 69 year old male referred here by the [facility] regarding the following issues:  Cirrhosis Abnormal imaging of the esophagus Rectal bleeding History of pancreatitis History of gallstones  As above, episode of gallstone pancreatitis diagnosed this past summer, status post cholecystectomy.  During this course he  was incidentally found to have cirrhosis, unclear etiology.  Does not drink any alcohol.  He has evidence of portal hypertension on imaging with suggestion of esophageal varices.  No overt decompensations of his cirrhosis we are aware of otherwise, suspect his prior hyperbilirubinemia was likely due to gallstones and improved once the stone passed.  I discussed what cirrhosis is with him at length, risks for decompensation and risks for [facility].  He is unaware of any lab work-up that has been done to evaluate this, as such I will send some basic labs today to screen for hemochromatosis, viral hepatitis, autoimmune hepatitis, etc.  I will check his immunity to hepatitis a and B and vaccinate if needed.  There is no evidence of any mass lesions in his liver, but will also send AFP for [facility] screening.  Given his CT scan findings and his history of cirrhosis, an EGD is recommended to screen for esophageal varices.  I discussed risks / benefits of this with him and that of anesthesia and he wants to proceed.  At the same time if he is being sedated for his EGD, recommend colonoscopy to evaluate his history of rectal bleeding in light of his anemia.  This is more than likely due to his hemorrhoids that were noted on his last exam but his last colonoscopy was over 6 years ago.  He is agreeable to do both of these.  We had an opening to do these procedures this week, otherwise it could be several weeks until we can get him in, he wanted to proceed with it this week.  I will recheck a CBC and INR prior to proceeding with that to ensure stable.  He is otherwise going to see hepatology at [facility] soon.  I advised him to activate his MyChart account for [facility] so he can pull up all the labs and documentation done from this encounter so his hepatologist can see his lab results and hopefully not have to repeat any of the blood work were doing.  Do not think he warrants evaluation for transplant at this time but  will await his labs.  He agrees with  the plan as outlined, will await labs from today and results of EGD and colonoscopy.  Further recommendations pending those results.  He will follow-up with hepatology as well.  [place] Cellar, MD [facility]  CC: [name], MD

## 2020-01-29 DIAGNOSIS — K7290 Hepatic failure, unspecified without coma: Secondary | ICD-10-CM | POA: Diagnosis not present

## 2020-01-29 DIAGNOSIS — K7460 Unspecified cirrhosis of liver: Secondary | ICD-10-CM | POA: Diagnosis not present

## 2020-01-29 DIAGNOSIS — E119 Type 2 diabetes mellitus without complications: Secondary | ICD-10-CM | POA: Diagnosis not present

## 2020-01-29 DIAGNOSIS — E669 Obesity, unspecified: Secondary | ICD-10-CM | POA: Diagnosis not present

## 2020-01-30 ENCOUNTER — Encounter: Payer: Self-pay | Admitting: Gastroenterology

## 2020-01-30 ENCOUNTER — Other Ambulatory Visit: Payer: Self-pay

## 2020-01-30 ENCOUNTER — Ambulatory Visit (AMBULATORY_SURGERY_CENTER): Payer: No Typology Code available for payment source | Admitting: Gastroenterology

## 2020-01-30 VITALS — BP 128/75 | HR 82 | Temp 98.0°F | Resp 19 | Ht 67.0 in | Wt 187.0 lb

## 2020-01-30 DIAGNOSIS — K766 Portal hypertension: Secondary | ICD-10-CM | POA: Diagnosis not present

## 2020-01-30 DIAGNOSIS — K2970 Gastritis, unspecified, without bleeding: Secondary | ICD-10-CM | POA: Diagnosis not present

## 2020-01-30 DIAGNOSIS — D123 Benign neoplasm of transverse colon: Secondary | ICD-10-CM | POA: Diagnosis not present

## 2020-01-30 DIAGNOSIS — K649 Unspecified hemorrhoids: Secondary | ICD-10-CM | POA: Diagnosis not present

## 2020-01-30 DIAGNOSIS — I8500 Esophageal varices without bleeding: Secondary | ICD-10-CM

## 2020-01-30 DIAGNOSIS — K7460 Unspecified cirrhosis of liver: Secondary | ICD-10-CM

## 2020-01-30 DIAGNOSIS — R933 Abnormal findings on diagnostic imaging of other parts of digestive tract: Secondary | ICD-10-CM

## 2020-01-30 DIAGNOSIS — K2950 Unspecified chronic gastritis without bleeding: Secondary | ICD-10-CM | POA: Diagnosis not present

## 2020-01-30 DIAGNOSIS — K625 Hemorrhage of anus and rectum: Secondary | ICD-10-CM | POA: Diagnosis not present

## 2020-01-30 MED ORDER — PROPRANOLOL HCL 20 MG PO TABS: 20.0000 mg | ORAL_TABLET | Freq: Two times a day (BID) | ORAL | 9 refills | Status: DC

## 2020-01-30 MED ORDER — SODIUM CHLORIDE 0.9 % IV SOLN: 500.0000 mL | Freq: Once | INTRAVENOUS | Status: DC

## 2020-01-30 NOTE — Op Note (Signed)
[facility] Patient Name: Corey Wood Procedure Date: 01/30/2020 10:49 AM MRN: [id number] Endoscopist: Remo Lipps P. Havery Moros , MD Age: 69 Referring MD:  Date of Birth: [birth year] Gender: Male Account #: [id number] Procedure:                Upper GI endoscopy Indications:              recently diagnosed cirrhosis rule out esophageal                            varices, Abnormal CT of the GI tract with                            esophageal thickening Medicines:                Monitored Anesthesia Care Procedure:                Pre-Anesthesia Assessment:                           - Prior to the procedure, a History and Physical                            was performed, and patient medications and                            allergies were reviewed. The patient's tolerance of                            previous anesthesia was also reviewed. The risks                            and benefits of the procedure and the sedation                            options and risks were discussed with the patient.                            All questions were answered, and informed consent                            was obtained. Prior Anticoagulants: The patient has                            taken no previous anticoagulant or antiplatelet                            agents. ASA Grade Assessment: III - A patient with                            severe systemic disease. After reviewing the risks                            and benefits, the patient was deemed in  satisfactory condition to undergo the procedure.                           After obtaining informed consent, the endoscope was                            passed under direct vision. Throughout the                            procedure, the patient's blood pressure, pulse, and                            oxygen saturations were monitored continuously. The                            Endoscope was introduced through  the mouth, and                            advanced to the second part of duodenum. The upper                            GI endoscopy was accomplished without difficulty.                            The patient tolerated the procedure well. Scope In: Scope Out: Findings:                 Esophagogastric landmarks were identified: the                            Z-line was found at 40 cm, the gastroesophageal                            junction was found at 40 cm and the upper extent of                            the gastric folds was found at 40 cm from the                            incisors.                           Large varices were found in the middle third of the                            esophagus and in the lower third of the esophagus.                            No high risk stigmata for bleeding appreciated.                           The exam of the esophagus was otherwise normal.  Patchy mildly erythematous mucosa was found in the                            gastric fundus and in the gastric antrum, suspect                            portal hypertensive gastritis.                           Suspected small type 2 gastroesophageal varices                            (GOV2, esophageal varices which extend along the                            fundus) were found in the cardia, just inferior to                            GEJ, extending down from the esophagus, without                            stigmata for bleeding.                           The exam of the stomach was otherwise normal.                           Biopsies were taken with a cold forceps in the                            gastric body and in the gastric antrum for                            Helicobacter pylori testing.                           There was very mild superficial erythema of the                            duodenal bulb and second portion of the duodenum.                            The  duodenum was otherwise normal. Complications:            No immediate complications. Estimated blood loss:                            Minimal. Estimated Blood Loss:     Estimated blood loss was minimal. Impression:               - Esophagogastric landmarks identified.                           - Large esophageal varices as described.                           -  Erythematous mucosa in the gastric fundus and                            antrum, suspect portal hypertensive gastritis.                            Biopsies taken to rule out H pylori.                           - Suspected small type 2 gastroesophageal varices                            (GOV2, esophageal varices which extend along the                            fundus).                           - Normal duodenal bulb and second portion of the                            duodenum, very mild superficial erythema noted. Recommendation:           - Patient has a contact number available for                            emergencies. The signs and symptoms of potential                            delayed complications were discussed with the                            patient. Return to normal activities tomorrow.                            Written discharge instructions were provided to the                            patient.                           - Resume previous diet.                           - Continue present medications.                           - Start propranolol 20mg  twice daily for treatment                            of esophageal varices / portal hypertension. Please                            call your primary care physician about this  recommendation, as your blood pressure medications                            may need to be adjusted when starting this                           - Await pathology results. Remo Lipps P. Ramondo Dietze, MD 01/30/2020 11:39:41 AM This report has been signed  electronically.

## 2020-01-30 NOTE — Patient Instructions (Addendum)
YOU HAD AN ENDOSCOPIC PROCEDURE TODAY AT [facility]:   Refer to the procedure report that was given to you for any specific questions about what was found during the examination.  If the procedure report does not answer your questions, please call your gastroenterologist to clarify.  If you requested that your care partner not be given the details of your procedure findings, then the procedure report has been included in a sealed envelope for you to review at your convenience later.  YOU SHOULD EXPECT: Some feelings of bloating in the abdomen. Passage of more gas than usual.  Walking can help get rid of the air that was put into your GI tract during the procedure and reduce the bloating. If you had a lower endoscopy (such as a colonoscopy or flexible sigmoidoscopy) you may notice spotting of blood in your stool or on the toilet paper. If you underwent a bowel prep for your procedure, you may not have a normal bowel movement for a few days.  Please Note:  You might notice some irritation and congestion in your nose or some drainage.  This is from the oxygen used during your procedure.  There is no need for concern and it should clear up in a day or so.  SYMPTOMS TO REPORT IMMEDIATELY:   Following lower endoscopy (colonoscopy or flexible sigmoidoscopy):  Excessive amounts of blood in the stool  Significant tenderness or worsening of abdominal pains  Swelling of the abdomen that is new, acute  Fever of 100F or higher   Following upper endoscopy (EGD)  Vomiting of blood or coffee ground material  New chest pain or pain under the shoulder blades  Painful or persistently difficult swallowing  New shortness of breath  Fever of 100F or higher  Black, tarry-looking stools  For urgent or emergent issues, a gastroenterologist can be reached at any hour by calling [phone]. Do not use MyChart messaging for urgent concerns.    DIET:  We do recommend a small meal at first, but  then you may proceed to your regular diet.  Drink plenty of fluids but you should avoid alcoholic beverages for 24 hours.  MEDICATIONS: Continue present medications. Start Propranolol 20 mg by mouth twice daily for treatment of esophageal varices/portal hypertension. A prescription has been electronically sent to the [facility] pharmacy in [place] to get you started until you follow up with the [facility]. STOP Cozaar for now and speak with your primary care physician about these changes as your blood pressure medications may need to be adjusted when starting this new medication.   Please see handouts given to you by your recovery nurse.  ACTIVITY:  You should plan to take it easy for the rest of today and you should NOT DRIVE or use heavy machinery until tomorrow (because of the sedation medicines used during the test).    FOLLOW UP: Our staff will call the number listed on your records 48-72 hours following your procedure to check on you and address any questions or concerns that you may have regarding the information given to you following your procedure. If we do not reach you, we will leave a message.  We will attempt to reach you two times.  During this call, we will ask if you have developed any symptoms of COVID 19. If you develop any symptoms (ie: fever, flu-like symptoms, shortness of breath, cough etc.) before then, please call [phone].  If you test positive for Covid 19 in the 2 weeks post  procedure, please call and report this information to [place].    If any biopsies were taken you will be contacted by phone or by letter within the next 1-3 weeks.  Please call us at [phone] if you have not heard about the biopsies in 3 weeks.   Thank you for allowing [place] to provide for your healthcare needs today.   SIGNATURES/CONFIDENTIALITY: You and/or your care partner have signed paperwork which will be entered into your electronic medical record.  These signatures attest to the fact that that the  information above on your After Visit Summary has been reviewed and is understood.  Full responsibility of the confidentiality of this discharge information lies with you and/or your care-partner.

## 2020-01-30 NOTE — Op Note (Signed)
[place] Patient Name: Corey Wood Procedure Date: 01/30/2020 10:49 AM MRN: [id number] Endoscopist: Corey Wood , MD Age: 69 Referring MD:  Date of Birth: 07-22-1950 Gender: Male Account #: [id number] Procedure:                Colonoscopy Indications:              history of Rectal bleeding, history of cirrhosis Medicines:                Monitored Anesthesia Care Procedure:                Pre-Anesthesia Assessment:                           - Prior to the procedure, a History and Physical                            was performed, and patient medications and                            allergies were reviewed. The patient's tolerance of                            previous anesthesia was also reviewed. The risks                            and benefits of the procedure and the sedation                            options and risks were discussed with the patient.                            All questions were answered, and informed consent                            was obtained. Prior Anticoagulants: The patient has                            taken no previous anticoagulant or antiplatelet                            agents. ASA Grade Assessment: III - A patient with                            severe systemic disease. After reviewing the risks                            and benefits, the patient was deemed in                            satisfactory condition to undergo the procedure.                           After obtaining informed consent, the colonoscope  was passed under direct vision. Throughout the                            procedure, the patient's blood pressure, pulse, and                            oxygen saturations were monitored continuously. The                            Colonoscope was introduced through the anus and                            advanced to the the terminal ileum, with                            identification  of the appendiceal orifice and IC                            valve. The colonoscopy was performed without                            difficulty. The patient tolerated the procedure                            well. The quality of the bowel preparation was                            good. The terminal ileum, ileocecal valve,                            appendiceal orifice, and rectum were photographed. Scope In: 11:08:07 AM Scope Out: 11:22:55 AM Scope Withdrawal Time: 0 hours 12 minutes 32 seconds  Total Procedure Duration: 0 hours 14 minutes 48 seconds  Findings:                 The perianal and digital rectal examinations were                            normal.                           The terminal ileum appeared normal.                           A 5 to 6 mm polyp was found in the transverse                            colon. The polyp was flat. The polyp was removed                            with a cold snare. Resection and retrieval were                            complete.  Moderately congested mucosa was found in the entire                            colon, suspected due to portal hypertension.                           Internal hemorrhoids were found during retroflexion.                           The exam was otherwise without abnormality. Complications:            No immediate complications. Estimated blood loss:                            Minimal. Estimated Blood Loss:     Estimated blood loss was minimal. Impression:               - The examined portion of the ileum was normal.                           - One 5 to 6 mm polyp in the transverse colon,                            removed with a cold snare. Resected and retrieved.                           - Congested mucosa in the entire examined colon due                            to portal hypertension.                           - Internal hemorrhoids.                           - The examination was  otherwise normal.                           Bleeding more than likely was due to hemorrhoids,                            no other concerning pathology on the exam today. Recommendation:           - Patient has a contact number available for                            emergencies. The signs and symptoms of potential                            delayed complications were discussed with the                            patient. Return to normal activities tomorrow.  Written discharge instructions were provided to the                            patient.                           - Resume previous diet.                           - Continue present medications.                           - Await pathology results.                           - Monitor for recurrent bleeding, if this is                            persistent please contact me Corey Lipps P. [name], MD 01/30/2020 11:29:34 AM This report has been signed electronically.

## 2020-01-30 NOTE — Progress Notes (Signed)
1105 Ephedrine 10 mg given IV due to low BP, MD updated.

## 2020-01-30 NOTE — Progress Notes (Signed)
Pt's states no medical or surgical changes since previsit or office visit. 

## 2020-01-30 NOTE — Progress Notes (Signed)
Called to room to assist during endoscopic procedure.  Patient ID and intended procedure confirmed with present staff. Received instructions for my participation in the procedure from the performing physician.  

## 2020-01-30 NOTE — Progress Notes (Signed)
1057 Robinul 0.1 mg IV given due large amount of secretions upon assessment.  MD made aware, vss  °

## 2020-01-31 LAB — HEPATITIS A ANTIBODY, TOTAL: Hepatitis A AB,Total: NONREACTIVE

## 2020-01-31 LAB — IRON,TIBC AND FERRITIN PANEL
%SAT: 25 % (calc) (ref 20–48)
Ferritin: 147 ng/mL (ref 24–380)
Iron: 59 ug/dL (ref 50–180)
TIBC: 235 mcg/dL (calc) — ABNORMAL LOW (ref 250–425)

## 2020-01-31 LAB — IGG: IgG (Immunoglobin G), Serum: 2562 mg/dL — ABNORMAL HIGH (ref 600–1540)

## 2020-01-31 LAB — ANTI-SMOOTH MUSCLE ANTIBODY, IGG: Actin (Smooth Muscle) Antibody (IGG): <20 U (ref <20)

## 2020-01-31 LAB — HEPATITIS B SURFACE ANTIGEN: Hepatitis B Surface Ag: NONREACTIVE

## 2020-01-31 LAB — ANA: Anti Nuclear Antibody (ANA): NEGATIVE

## 2020-01-31 LAB — HEPATITIS C ANTIBODY
Hepatitis C Ab: NONREACTIVE
SIGNAL TO CUT-OFF: 0.28 (ref <1.00)

## 2020-01-31 LAB — AFP TUMOR MARKER: AFP-Tumor Marker: 2 ng/mL (ref <6.1)

## 2020-01-31 LAB — HEPATITIS B SURFACE ANTIBODY,QUALITATIVE: Hep B S Ab: REACTIVE — AB

## 2020-02-01 ENCOUNTER — Telehealth: Payer: Self-pay

## 2020-02-01 NOTE — Telephone Encounter (Signed)
  Follow up Call-  Call back number 01/30/2020  Post procedure Call Back phone  # [phone]  Permission to leave phone message Yes  Some recent data might be hidden     Patient questions:  Do you have a fever, pain , or abdominal swelling? No. Pain Score  0 *  Have you tolerated food without any problems? Yes.    Have you been able to return to your normal activities? Yes.    Do you have any questions about your discharge instructions: Diet   No. Medications  No. Follow up visit  No.  Do you have questions or concerns about your Care? No.  Actions: * If pain score is 4 or above: No action needed, pain <4.  Have you developed a fever since your procedure? no 2.   Have you had an respiratory symptoms (SOB or cough) since your procedure? no  3.   Have you tested positive for COVID 19 since your procedure no  4.   Have you had any family members/close contacts diagnosed with the COVID 19 since your procedure?  no   If yes to any of these questions please route to Joylene John, RN and Joella Prince, RN

## 2020-02-06 NOTE — Telephone Encounter (Signed)
Pt called back to let us know that he was doing fine

## 2020-02-14 DIAGNOSIS — N35919 Unspecified urethral stricture, male, unspecified site: Secondary | ICD-10-CM | POA: Diagnosis not present

## 2020-03-05 ENCOUNTER — Telehealth: Payer: Self-pay | Admitting: Gastroenterology

## 2020-03-05 NOTE — Telephone Encounter (Signed)
Lm on vm for patient's wife to return call. 

## 2020-03-05 NOTE — Telephone Encounter (Signed)
Spoke with patient's wife and provided her the information for [name], NP and office number to schedule a follow up appointment.   Wife also wanted to make Dr. Havery Moros aware that patient's PCP advsied that patient discontinue Propranolol 20 mg BID, wife states that patient's heart rate was dropping too low and he was having falls. She had no other concerns at the end of the call.

## 2020-03-05 NOTE — Telephone Encounter (Signed)
He had already been referred to Hepatology in [place]. I guess at that appointment, they had recommend he see [name] locally in [place] to make it easier for him. I don't think he needs a referral there since he is plugged into their system, he just needs to call and coordinate a follow up visit with [name]. Thanks

## 2020-03-05 NOTE — Telephone Encounter (Signed)
Pt's wife Ivin Booty called inquiring about the name of an MD that Dr. Havery Moros referred him to. She states that it was a male physician. Pls call her.

## 2020-03-05 NOTE — Telephone Encounter (Signed)
[place] this is unfortunate. He has large varices and is high risk for bleeding without therapy. Not sure if they tried lower dose propranolol 10mg  BID and see how he tolerated it? If he cannot tolerate it he will need an EGD at the hospital for banding of varices. Can you see how they wish to proceed? If he can't tolerate propranolol at all will try to add him in January to hospital case for banding of varices. Thanks

## 2020-03-06 NOTE — Telephone Encounter (Signed)
Okay thanks for the update. If they can provide his contact information I'd be happy to call him

## 2020-03-06 NOTE — Telephone Encounter (Signed)
Spoke with Mrs. Corey Wood, she states that she does not have the number for Dr. Marjo Bicker, she states that she just calls the [facility] and asks for her.  [place] clinic, spoke with [name] Kitchen, he states that we will have to fax a request to the provider requesting their contact information, he states that it will ultimately be up to the provider if they decide to provide their contact information. He requested that I fax the request to [phone]. Request for Dr. [name] contact information has been faxed, will await response.

## 2020-03-06 NOTE — Telephone Encounter (Signed)
Spoke with patient's wife, she states that Dr. Marjo Bicker did not decrease propranolol at all, she just discontinued it,she states that his heart rate was 46 when he was seen by Dr. Marjo Bicker, she had patient walk while he was in the office and he was very unstable even with his walker. As far as the procedure, wife states that they have to run everything by the [place]. Wife is wanting to know if you could speak with Dr. Marjo Bicker or send her your recommendations. Wife states that she just wants the best for the patient.Please advise, thank you.

## 2020-03-12 NOTE — Telephone Encounter (Signed)
Received a call from Corey Alberts. at [facility] in regards to request. We tried to find a time that worked with Dr. Doyne Keel schedule as well as Dr. Marjo Bicker, unfortunately we could not find a time to accommodate both. Corey Wood asks that I fax a request for records from [name] with release of information at [phone]. She states that patient was seen by PCP on 03/03/20 and GI on 03/06/20, she states that she hopes this helps. Advised that it would limit confusion if providers were able to speak with each other directly. Will fax request for records. I provided her with my direct number if Dr. Marjo Bicker has a cancellation to speak with Corey Wood. Request for records faxed.   I previously spoke with patient's wife and advised that Dr. Marjo Bicker give [place] a call when she has time, wife states that she would take our contact information to Dr. Marjo Bicker office. Wife states that they will not cover patient to see [name] and he will have to do that in house at the [place] in [place]. Wife verbalized understanding of information and had no concerns at the end of the call.

## 2020-03-13 ENCOUNTER — Telehealth: Payer: Self-pay | Admitting: Gastroenterology

## 2020-03-13 NOTE — Telephone Encounter (Signed)
Spoke with Dr. Marjo Bicker of the [facility] about this patient. He has large esophageal varices and small gastric varices and portal hypertensive gastritis. I placed him on propranolol for this but she states he did not tolerate it. HR was around 40, he had orthostatic hypotension. His baseline HR is in the 50s. Actually had a Holter monitor done and awaiting the results of that. It appears he is not going to tolerate any beta blockade. In this light, he will need banding of his varices to reduce his bleeding risk, unfortunately this can only be done at the hospital. She was supportive of treating the varices this way.   Jan, this patient needs to be added to the hospital list. I will look at dates and see if there are any openings in January to do it, we can discuss. Thanks

## 2020-03-14 NOTE — Telephone Encounter (Signed)
Yes, if that is open or if there is a spot that opens on 1/3 which we discussed (one of those patients may fall off), let use it. 1/3 would be preferred if it opens up. Thanks

## 2020-03-26 ENCOUNTER — Other Ambulatory Visit: Payer: Self-pay

## 2020-03-26 DIAGNOSIS — K7460 Unspecified cirrhosis of liver: Secondary | ICD-10-CM

## 2020-03-26 DIAGNOSIS — R933 Abnormal findings on diagnostic imaging of other parts of digestive tract: Secondary | ICD-10-CM

## 2020-03-26 DIAGNOSIS — I8510 Secondary esophageal varices without bleeding: Secondary | ICD-10-CM

## 2020-03-26 DIAGNOSIS — K2970 Gastritis, unspecified, without bleeding: Secondary | ICD-10-CM

## 2020-03-26 NOTE — Progress Notes (Signed)
EGD at [facility] on Thursday, 1-20 at 1:00pm Case # [id number].

## 2020-03-26 NOTE — Telephone Encounter (Signed)
Called patient. He is available to have EGD at [facility] on 04-17-20 at 1:00pm.  He understands he will have to be Covid tested on Monday, 1-17. Scheduled covid test for 8:45am. Pre Visit scheduled for Thursday, 04-03-20 at 9:00am.  Letter mailed to pt with dates and times for all appointments.

## 2020-04-02 ENCOUNTER — Ambulatory Visit (AMBULATORY_SURGERY_CENTER): Payer: Self-pay

## 2020-04-02 ENCOUNTER — Other Ambulatory Visit: Payer: Self-pay

## 2020-04-02 VITALS — Ht 67.0 in | Wt 189.0 lb

## 2020-04-02 DIAGNOSIS — I8510 Secondary esophageal varices without bleeding: Secondary | ICD-10-CM

## 2020-04-02 DIAGNOSIS — Z01818 Encounter for other preprocedural examination: Secondary | ICD-10-CM

## 2020-04-02 DIAGNOSIS — K7460 Unspecified cirrhosis of liver: Secondary | ICD-10-CM

## 2020-04-02 NOTE — Progress Notes (Signed)
No egg or soy allergy known to patient  No issues with past sedation with any surgeries or procedures No intubation problems in the past  No FH of Malignant Hyperthermia No diet pills per patient No home 02 use per patient  No blood thinners per patient  Pt denies issues with constipation  No A fib or A flutter  EMMI video via MyChart  COVID 19 guidelines implemented in [facility] today with Pt and RN  Pt is fully vaccinated  for Covid ---however,  COVID screening scheduled on 04/14/2020 at 8:45 am; Due to the COVID-19 pandemic we are asking patients to follow certain guidelines.  Pt aware of COVID protocols and [facility] guidelines

## 2020-04-03 ENCOUNTER — Other Ambulatory Visit: Payer: Self-pay

## 2020-04-03 ENCOUNTER — Encounter (HOSPITAL_COMMUNITY): Payer: Self-pay | Admitting: Gastroenterology

## 2020-04-03 NOTE — Telephone Encounter (Signed)
Patient's wife called in to verify procedure that patient will be having on 04/17/20, she states that she needed this information for the [place] so that they can determine if they will be able to cover the procedure or not. Advised wife that patient is scheduled for an EGD with esophageal banding. Wife verbalized understanding of information and had no other concerns at the end of the call.

## 2020-04-14 ENCOUNTER — Other Ambulatory Visit (HOSPITAL_COMMUNITY): Admission: RE | Admit: 2020-04-14 | Payer: Non-veteran care | Source: Ambulatory Visit

## 2020-04-15 ENCOUNTER — Other Ambulatory Visit (HOSPITAL_COMMUNITY)
Admission: RE | Admit: 2020-04-15 | Discharge: 2020-04-15 | Disposition: A | Discharge status: Home / Self Care | Payer: No Typology Code available for payment source | Source: Ambulatory Visit | Attending: Gastroenterology | Admitting: Gastroenterology

## 2020-04-15 DIAGNOSIS — Z01812 Encounter for preprocedural laboratory examination: Secondary | ICD-10-CM | POA: Insufficient documentation

## 2020-04-15 DIAGNOSIS — Z20822 Contact with and (suspected) exposure to covid-19: Secondary | ICD-10-CM | POA: Insufficient documentation

## 2020-04-16 LAB — SARS CORONAVIRUS 2 (TAT 6-24 HRS): SARS Coronavirus 2: NEGATIVE

## 2020-04-17 ENCOUNTER — Encounter (HOSPITAL_COMMUNITY): Payer: Self-pay | Admitting: Gastroenterology

## 2020-04-17 ENCOUNTER — Other Ambulatory Visit: Payer: Self-pay

## 2020-04-17 ENCOUNTER — Ambulatory Visit (HOSPITAL_COMMUNITY): Payer: No Typology Code available for payment source | Admitting: Registered Nurse

## 2020-04-17 ENCOUNTER — Encounter (HOSPITAL_COMMUNITY)
Admission: RE | Disposition: A | Discharge status: Home / Self Care | Payer: Self-pay | Source: Home / Self Care | Attending: Gastroenterology

## 2020-04-17 ENCOUNTER — Ambulatory Visit (HOSPITAL_COMMUNITY)
Admission: RE | Admit: 2020-04-17 | Discharge: 2020-04-17 | Disposition: A | Discharge status: Home / Self Care | Payer: No Typology Code available for payment source | Attending: Gastroenterology | Admitting: Gastroenterology

## 2020-04-17 DIAGNOSIS — E1122 Type 2 diabetes mellitus with diabetic chronic kidney disease: Secondary | ICD-10-CM | POA: Insufficient documentation

## 2020-04-17 DIAGNOSIS — D469 Myelodysplastic syndrome, unspecified: Secondary | ICD-10-CM | POA: Insufficient documentation

## 2020-04-17 DIAGNOSIS — K219 Gastro-esophageal reflux disease without esophagitis: Secondary | ICD-10-CM | POA: Diagnosis not present

## 2020-04-17 DIAGNOSIS — K7460 Unspecified cirrhosis of liver: Secondary | ICD-10-CM | POA: Insufficient documentation

## 2020-04-17 DIAGNOSIS — I8510 Secondary esophageal varices without bleeding: Secondary | ICD-10-CM | POA: Insufficient documentation

## 2020-04-17 DIAGNOSIS — N183 Chronic kidney disease, stage 3 unspecified: Secondary | ICD-10-CM | POA: Diagnosis not present

## 2020-04-17 DIAGNOSIS — I864 Gastric varices: Secondary | ICD-10-CM | POA: Diagnosis not present

## 2020-04-17 DIAGNOSIS — G4733 Obstructive sleep apnea (adult) (pediatric): Secondary | ICD-10-CM | POA: Insufficient documentation

## 2020-04-17 DIAGNOSIS — K2970 Gastritis, unspecified, without bleeding: Secondary | ICD-10-CM

## 2020-04-17 DIAGNOSIS — R933 Abnormal findings on diagnostic imaging of other parts of digestive tract: Secondary | ICD-10-CM

## 2020-04-17 DIAGNOSIS — J449 Chronic obstructive pulmonary disease, unspecified: Secondary | ICD-10-CM | POA: Diagnosis not present

## 2020-04-17 DIAGNOSIS — Z800 Family history of malignant neoplasm of digestive organs: Secondary | ICD-10-CM | POA: Insufficient documentation

## 2020-04-17 DIAGNOSIS — Z888 Allergy status to other drugs, medicaments and biological substances status: Secondary | ICD-10-CM | POA: Insufficient documentation

## 2020-04-17 DIAGNOSIS — I129 Hypertensive chronic kidney disease with stage 1 through stage 4 chronic kidney disease, or unspecified chronic kidney disease: Secondary | ICD-10-CM | POA: Insufficient documentation

## 2020-04-17 DIAGNOSIS — G20 Parkinson's disease: Secondary | ICD-10-CM | POA: Diagnosis not present

## 2020-04-17 DIAGNOSIS — K7469 Other cirrhosis of liver: Secondary | ICD-10-CM

## 2020-04-17 HISTORY — DX: Chronic obstructive pulmonary disease, unspecified: J44.9

## 2020-04-17 HISTORY — DX: Cardiac murmur, unspecified: R01.1

## 2020-04-17 HISTORY — PX: ESOPHAGEAL BANDING: SHX5518

## 2020-04-17 HISTORY — PX: ESOPHAGOGASTRODUODENOSCOPY (EGD) WITH PROPOFOL: SHX5813

## 2020-04-17 LAB — GLUCOSE, CAPILLARY: Glucose-Capillary: 150 mg/dL — ABNORMAL HIGH (ref 70–99)

## 2020-04-17 SURGERY — ESOPHAGOGASTRODUODENOSCOPY (EGD) WITH PROPOFOL
Anesthesia: Monitor Anesthesia Care

## 2020-04-17 MED ORDER — ONDANSETRON HCL 4 MG/2ML IJ SOLN
INTRAMUSCULAR | Status: DC | PRN
  Administered 2020-04-17: 4 mg via INTRAVENOUS

## 2020-04-17 MED ORDER — GLYCOPYRROLATE 0.2 MG/ML IJ SOLN
INTRAMUSCULAR | Status: DC | PRN
  Administered 2020-04-17: .1 mg via INTRAVENOUS

## 2020-04-17 MED ORDER — LIDOCAINE HCL (CARDIAC) PF 100 MG/5ML IV SOSY
PREFILLED_SYRINGE | INTRAVENOUS | Status: DC | PRN
  Administered 2020-04-17: 50 mg via INTRAVENOUS

## 2020-04-17 MED ORDER — SODIUM CHLORIDE 0.9 % IV SOLN: INTRAVENOUS | Status: DC

## 2020-04-17 MED ORDER — LACTATED RINGERS IV SOLN: INTRAVENOUS | Status: DC | PRN

## 2020-04-17 MED ORDER — PROPOFOL 500 MG/50ML IV EMUL
INTRAVENOUS | Status: DC | PRN
  Administered 2020-04-17: 200 ug/kg/min via INTRAVENOUS

## 2020-04-17 MED ORDER — LACTATED RINGERS IV SOLN: INTRAVENOUS | Status: DC

## 2020-04-17 MED ORDER — PROPOFOL 500 MG/50ML IV EMUL
INTRAVENOUS | Status: AC
  Filled 2020-04-17: qty 50

## 2020-04-17 MED ORDER — PHENYLEPHRINE HCL-NACL 10-0.9 MG/250ML-% IV SOLN
INTRAVENOUS | Status: DC | PRN
Start: 2020-04-17 — End: 2020-04-17
  Administered 2020-04-17: 50 ug/min via INTRAVENOUS

## 2020-04-17 SURGICAL SUPPLY — 15 items

## 2020-04-17 NOTE — H&P (Signed)
HPI :  70 y/o male with history of cirrhosis, esophageal varices intolerant of beta blocker (symptomatic bradycardia), here for EGD to band esophageal varices given size of them noted on last EGD when done in November. He is otherwise feeling well without complaints. No abdominal pains. No bleeding symptoms. He has not had variceal bleeding / ligation in the past. No complaints currently other than dry mouth. No anticoagulation.    Past Medical History:  Diagnosis Date  . Allergy    seasonal allergies  . Anxiety    on meds  . Aortic valve disorder   . Arthritis    generalized (fingers)(shoulders)  . Asthma    uses inhaler  . Cataract    bilateral -sx   . Cirrhosis ([place])   . CKD (chronic kidney disease), stage III ([place])    only has one kidney  . COPD (chronic obstructive pulmonary disease) ([place])   . Depression    on meds  . DM (diabetes mellitus) ([place])    on meds  . GERD (gastroesophageal reflux disease)    on meds  . Heart murmur   . History of colon polyps   . Hx of acute pancreatitis 10/2019  . Hyperlipidemia    on meds  . Hypertension    on meds  . Hypothyroidism    not on meds at this time  . Myelodysplastic syndrome ([place])   . Neuromuscular disorder ([place])    per pt  . Obstructive sleep apnea   . Parkinsonism ([place])    per pt report  . Peripheral positional vertigo      Past Surgical History:  Procedure Laterality Date  . ANKLE SURGERY    . CHOLECYSTECTOMY    . EYE SURGERY    . HERNIA REPAIR    . KIDNEY STONE SURGERY    . WISDOM TOOTH EXTRACTION     Family History  Problem Relation Age of Onset  . Liver cancer Mother   . Colon cancer Neg Hx   . Stomach cancer Neg Hx    Social History   Tobacco Use  . Smoking status: Never Smoker  . Smokeless tobacco: Never Used  Vaping Use  . Vaping Use: Never used  Substance Use Topics  . Alcohol use: Not Currently  . Drug use: Not Currently   Current Facility-Administered Medications  Medication Dose  Route Frequency Provider Last Rate Last Admin  . 0.9 %  sodium chloride infusion   Intravenous Continuous Smt Lokey, Carlota Raspberry, MD      . lactated ringers infusion   Intravenous Continuous Willett Lefeber, Carlota Raspberry, MD       Allergies  Allergen Reactions  . Lisinopril Cough     Review of Systems: All systems reviewed and negative except where noted in HPI.   Lab Results  Component Value Date   WBC 2.1 Repeated and verified X2. (L) 01/28/2020   HGB 10.3 (L) 01/28/2020   HCT 30.2 (L) 01/28/2020   MCV 92.1 01/28/2020   PLT 72.0 (L) 01/28/2020    Lab Results  Component Value Date   CREATININE 1.51 (H) 01/28/2020   BUN 18 01/28/2020   NA 139 01/28/2020   K 4.2 01/28/2020   CL 103 01/28/2020   CO2 29 01/28/2020    Lab Results  Component Value Date   ALT 12 01/28/2020   AST 17 01/28/2020   ALKPHOS 116 01/28/2020   BILITOT 0.5 01/28/2020    Lab Results  Component Value Date   INR 1.2 (H) 01/28/2020  Physical Exam: BP 139/71   Pulse 61   Temp 98.4 F (36.9 C) (Oral)   Resp 15   Ht 5\' 7"  (1.702 m)   Wt 77.1 kg   SpO2 95%   BMI 26.62 kg/m  Constitutional: Pleasant,well-developed, male in no acute distress.  Cardiovascular: Normal rate, regular rhythm.  Pulmonary/chest: Effort normal and breath sounds normal.  Abdominal: Soft, nondistended, nontender.There are no masses palpable. Lymphadenopathy: No cervical adenopathy noted. Neurological: Alert and oriented to person place and time. Skin: Skin is warm and dry. No rashes noted. Psychiatric: Normal mood and affect. Behavior is normal.   ASSESSMENT AND PLAN: 70 y/o male here for EGD to re-evaluate esophageal varices and perform band ligation given his intolerant to propranolol previously (symptomatic bradycardia). I have discussed risks / benefits of EGD and anesthesia with him, as well as risks for banding. He understands risks for bleeding with this over the next 2 weeks and wants to proceed. Further  recommendations pending the results.  [place] Cellar, MD [facility]

## 2020-04-17 NOTE — Transfer of Care (Signed)
Immediate Anesthesia Transfer of Care Note  Patient: Corey Wood  Procedure(s) Performed: ESOPHAGOGASTRODUODENOSCOPY (EGD) WITH PROPOFOL (N/A ) ESOPHAGEAL BANDING (N/A )  Patient Location: PACU  Anesthesia Type:MAC  Level of Consciousness: awake, alert , oriented and patient cooperative  Airway & Oxygen Therapy: Patient Spontanous Breathing and Patient connected to face mask oxygen  Post-op Assessment: Report given to RN, Post -op Vital signs reviewed and stable and Patient moving all extremities X 4  Post vital signs: stable  Last Vitals:  Vitals Value Taken Time  BP 137/73 04/17/20 1330  Temp    Pulse 58 04/17/20 1334  Resp 26 04/17/20 1334  SpO2 98 % 04/17/20 1334  Vitals shown include unvalidated device data.  Last Pain:  Vitals:   04/17/20 1327  TempSrc:   PainSc: 0-No pain         Complications: No complications documented.

## 2020-04-17 NOTE — Anesthesia Postprocedure Evaluation (Signed)
Anesthesia Post Note  Patient: Corey Wood  Procedure(s) Performed: ESOPHAGOGASTRODUODENOSCOPY (EGD) WITH PROPOFOL (N/A ) ESOPHAGEAL BANDING (N/A )     Patient location during evaluation: PACU Anesthesia Type: MAC Level of consciousness: awake and alert Pain management: pain level controlled Vital Signs Assessment: post-procedure vital signs reviewed and stable Respiratory status: spontaneous breathing, nonlabored ventilation, respiratory function stable and patient connected to nasal cannula oxygen Cardiovascular status: stable and blood pressure returned to baseline Postop Assessment: no apparent nausea or vomiting Anesthetic complications: no   No complications documented.  Last Vitals:  Vitals:   04/17/20 1353 04/17/20 1355  BP: (!) 143/63   Pulse: (!) 58 (!) 52  Resp: (!) 21 15  Temp:  36.8 C  SpO2: (!) 89% 94%    Last Pain:  Vitals:   04/17/20 1355  TempSrc: Oral  PainSc:                  [name]

## 2020-04-17 NOTE — Anesthesia Preprocedure Evaluation (Signed)
Anesthesia Evaluation  Patient identified by MRN, date of birth, ID band Patient awake    Reviewed: Allergy & Precautions, NPO status , Patient's Chart, lab work & pertinent test results  Airway Mallampati: I  TM Distance: >3 FB Neck ROM: Full    Dental  (+) Teeth Intact, Dental Advisory Given   Pulmonary asthma , sleep apnea , COPD,  COPD inhaler,    breath sounds clear to auscultation       Cardiovascular hypertension, Pt. on medications + Valvular Problems/Murmurs AS  Rhythm:Regular Rate:Normal     Neuro/Psych PSYCHIATRIC DISORDERS Anxiety Depression  Neuromuscular disease    GI/Hepatic Neg liver ROS, GERD  Medicated,  Endo/Other  diabetes, Type 2, Insulin DependentHypothyroidism   Renal/GU Renal disease     Musculoskeletal  (+) Arthritis ,   Abdominal Normal abdominal exam  (+)   Peds  Hematology negative hematology ROS (+)   Anesthesia Other Findings - HLD  Reproductive/Obstetrics                             Anesthesia Physical Anesthesia Plan  ASA: III  Anesthesia Plan: MAC   Post-op Pain Management:    Induction: Intravenous  PONV Risk Score and Plan: 0 and Propofol infusion  Airway Management Planned: Simple Face Mask and Natural Airway  Additional Equipment: None  Intra-op Plan:   Post-operative Plan:   Informed Consent: I have reviewed the patients History and Physical, chart, labs and discussed the procedure including the risks, benefits and alternatives for the proposed anesthesia with the patient or authorized representative who has indicated his/her understanding and acceptance.       Plan Discussed with: CRNA  Anesthesia Plan Comments:         Anesthesia Quick Evaluation

## 2020-04-17 NOTE — Discharge Instructions (Signed)
Monitored Anesthesia Care, Care After This sheet gives you information about how to care for yourself after your procedure. Your health care provider may also give you more specific instructions. If you have problems or questions, contact your health care provider. What can I expect after the procedure? After the procedure, it is common to have:  Tiredness.  Forgetfulness about what happened after the procedure.  Impaired judgment for important decisions.  Nausea or vomiting.  Some difficulty with balance. Follow these instructions at home: For the time period you were told by your health care provider:  Rest as needed.  Do not participate in activities where you could fall or become injured.  Do not drive or use machinery.  Do not drink alcohol.  Do not take sleeping pills or medicines that cause drowsiness.  Do not make important decisions or sign legal documents.  Do not take care of children on your own.      Eating and drinking  Follow the diet that is recommended by your health care provider.  Drink enough fluid to keep your urine pale yellow.  If you vomit: ? Drink water, juice, or soup when you can drink without vomiting. ? Make sure you have little or no nausea before eating solid foods. General instructions  Have a responsible adult stay with you for the time you are told. It is important to have someone help care for you until you are awake and alert.  Take over-the-counter and prescription medicines only as told by your health care provider.  If you have sleep apnea, surgery and certain medicines can increase your risk for breathing problems. Follow instructions from your health care provider about wearing your sleep device: ? Anytime you are sleeping, including during daytime naps. ? While taking prescription pain medicines, sleeping medicines, or medicines that make you drowsy.  Avoid smoking.  Keep all follow-up visits as told by your health care  provider. This is important. Contact a health care provider if:  You keep feeling nauseous or you keep vomiting.  You feel light-headed.  You are still sleepy or having trouble with balance after 24 hours.  You develop a rash.  You have a fever.  You have redness or swelling around the IV site. Get help right away if:  You have trouble breathing.  You have new-onset confusion at home. Summary  For several hours after your procedure, you may feel tired. You may also be forgetful and have poor judgment.  Have a responsible adult stay with you for the time you are told. It is important to have someone help care for you until you are awake and alert.  Rest as told. Do not drive or operate machinery. Do not drink alcohol or take sleeping pills.  Get help right away if you have trouble breathing, or if you suddenly become confused. This information is not intended to replace advice given to you by your health care provider. Make sure you discuss any questions you have with your health care provider. Document Revised: 11/29/2019 Document Reviewed: 02/15/2019 Elsevier Patient Education  2021 [employer] Upper Endoscopy, Adult, Care After This sheet gives you information about how to care for yourself after your procedure. Your health care provider may also give you more specific instructions. If you have problems or questions, contact your health care provider. What can I expect after the procedure? After the procedure, it is common to have:  A sore throat.  Mild stomach pain or discomfort.  Bloating.  Nausea.   Follow these instructions at home:  Follow instructions from your health care provider about what to eat or drink after your procedure.  Return to your normal activities as told by your health care provider. Ask your health care provider what activities are safe for you.  Take over-the-counter and prescription medicines only as told by your health care  provider.  If you were given a sedative during the procedure, it can affect you for several hours. Do not drive or operate machinery until your health care provider says that it is safe.  Keep all follow-up visits as told by your health care provider. This is important.   Contact a health care provider if you have:  A sore throat that lasts longer than one day.  Trouble swallowing. Get help right away if:  You vomit blood or your vomit looks like coffee grounds.  You have: ? A fever. ? Bloody, black, or tarry stools. ? A severe sore throat or you cannot swallow. ? Difficulty breathing. ? Severe pain in your chest or abdomen. Summary  After the procedure, it is common to have a sore throat, mild stomach discomfort, bloating, and nausea.  If you were given a sedative during the procedure, it can affect you for several hours. Do not drive or operate machinery until your health care provider says that it is safe.  Follow instructions from your health care provider about what to eat or drink after your procedure.  Return to your normal activities as told by your health care provider. This information is not intended to replace advice given to you by your health care provider. Make sure you discuss any questions you have with your health care provider. Document Revised: 03/13/2019 Document Reviewed: 08/15/2017 Elsevier Patient Education  2021 [employer] Esophageal Variceal Ligation, Care After After an esophageal variceal ligation (EVL), it is common to have bleeding, pain and soreness in the chest. You may also have problems swallowing. Follow these instructions at home: Your doctor may give you more specific instructions. If you have problems, call your doctor. Eating and drinking You will have limits on what you can eat for the first 1-2 days after your procedure.  You will start with a liquid diet. Later, you will start to eat soft foods.  Do not drink alcohol.  You may  return to your normal diet after 2 days, if you no longer have trouble swallowing.   Activity  Do not lift anything that is heavier than 10 lb (4.5 kg), or the limit that you are told.  Do not drive or use heavy machines while taking prescription pain medicine.  Return to your normal activities when your doctor says that it is okay.  Avoid physical activity that takes a lot of effort. Ask your health care provider what exercises are safe for you. General instructions  Take over-the-counter and prescription medicines as you are told.  Take your antibiotic medicine as told by your doctor. Do not stop taking them even if you start to feel better.  Do not smoke cigarettes or e-cigarettes. Do not chew tobacco. Ask your doctor if you need help.  Keep all follow-up visits.   Contact a doctor if:  You have chest pain that lasts for more than 3 days after you go home.  You have trouble swallowing that lasts for more than 3 days after you go home.  You have fever or chills. Get help right away if:  You have bleeding from your throat.  You have   bleeding from your bottom (rectum).  You throw up (vomit) bright red blood.  You are unable to swallow.  You are short of breath.  You have very bad chest pain or back pain. These symptoms may be an emergency. Get help right away. Call 911.  Do not wait to see if the symptoms will go away.  Do not drive yourself to the hospital. Summary  After an EVL procedure, it is common to have pain, bleeding, and trouble swallowing.  Follow your doctor's home care instructions.  Stay on a liquid or soft diet until your doctor says that you can go back to your normal diet.  Contact a doctor if you have chills, fever, chest pain, or trouble swallowing.  Get help right away if you have bleeding, are unable to swallow, or have very bad chest or back pain. This information is not intended to replace advice given to you by your health care provider.  Make sure you discuss any questions you have with your health care provider. Document Revised: 07/03/2019 Document Reviewed: 07/03/2019 Elsevier Patient Education  2021 [employer] Inc[employer]  

## 2020-04-17 NOTE — Interval H&P Note (Signed)
History and Physical Interval Note:  04/17/2020 12:50 PM  Corey Wood  has presented today for surgery, with the diagnosis of cirrhosis with esophageal varices.  The various methods of treatment have been discussed with the patient and family. After consideration of risks, benefits and other options for treatment, the patient has consented to  Procedure(s): ESOPHAGOGASTRODUODENOSCOPY (EGD) WITH PROPOFOL (N/A) ESOPHAGEAL BANDING (N/A) as a surgical intervention.  The patient's history has been reviewed, patient examined, no change in status, stable for surgery.  I have reviewed the patient's chart and labs.  Questions were answered to the patient's satisfaction.     [place]

## 2020-04-17 NOTE — Op Note (Signed)
[facility] Patient Name: Corey Wood Procedure Date: 04/17/2020 MRN: [id number] Attending MD: Carlota Raspberry. Havery Moros , MD Date of Birth: June 28, 1950 CSN: [id number] Age: 70 Admit Type: Outpatient Procedure:                Upper GI endoscopy Indications:              For therapy of esophageal varices - large varices                            noted on last EGD, intolerant to propranolol                            (symptomatic bradycardia), here for banding of                            varices as primary therapy Providers:                Carlota Raspberry. Havery Moros, MD, Particia Nearing, RN,                            Janee Morn, Technician Referring MD:              Medicines:                Monitored Anesthesia Care Complications:            No immediate complications. Estimated blood loss:                            Minimal. Estimated Blood Loss:     Estimated blood loss was minimal. Procedure:                Pre-Anesthesia Assessment:                           - Prior to the procedure, a History and Physical                            was performed, and patient medications and                            allergies were reviewed. The patient's tolerance of                            previous anesthesia was also reviewed. The risks                            and benefits of the procedure and the sedation                            options and risks were discussed with the patient.                            All questions were answered, and informed consent  was obtained. Prior Anticoagulants: The patient has                            taken no previous anticoagulant or antiplatelet                            agents. ASA Grade Assessment: III - A patient with                            severe systemic disease. After reviewing the risks                            and benefits, the patient was deemed in                            satisfactory  condition to undergo the procedure.                           After obtaining informed consent, the endoscope was                            passed under direct vision. Throughout the                            procedure, the patient's blood pressure, pulse, and                            oxygen saturations were monitored continuously. The                            GIF-H190 ([id number]) was introduced through the                            mouth, and advanced to the second part of duodenum.                            The upper GI endoscopy was accomplished without                            difficulty. The patient tolerated the procedure                            well. Scope In: Scope Out: Findings:      Esophagogastric landmarks were identified: the Z-line was found at 41       cm, the gastroesophageal junction was found at 41 cm and the upper       extent of the gastric folds was found at 41 cm from the incisors.      Large varices were found in the middle third of the esophagus and in the       lower third of the esophagus. They were large in size. Eleven bands were       successfully placed in the distal esophagus initialy and then extending       up mid esophagus, resulting in deflation of varices.  The exam of the esophagus was otherwise normal.      Type 2 gastroesophageal varices (GOV2, esophageal varices which extend       along the fundus) were found in the cardia. They were small in largest       diameter.      Patchy mildly erythematous mucosa was found in the gastric antrum.      The exam of the stomach was otherwise normal.      The duodenal bulb and second portion of the duodenum were normal. Impression:               - Esophagogastric landmarks identified.                           - Large esophageal varices. Banded x 11.                           - Type 2 gastroesophageal varices (GOV2, esophageal                            varices which extend along the fundus).                            - Erythematous mucosa in the antrum.                           - Normal duodenal bulb and second portion of the                            duodenum. Moderate Sedation:      No moderate sedation, case performed with MAC Recommendation:           - Patient has a contact number available for                            emergencies. The signs and symptoms of potential                            delayed complications were discussed with the                            patient. Return to normal activities tomorrow.                            Written discharge instructions were provided to the                            patient.                           - liquid diet now, advance slowly to soft later                            today                           - Continue present medications.                           -  Repeat upper endoscopy in 1-2 month for                            additional surveillance and treatment / banding of                            varices if needed. Procedure Code(s):        --- Professional ---                           [phone], Esophagogastroduodenoscopy, flexible,                            transoral; with band ligation of esophageal/gastric                            varices Diagnosis Code(s):        --- Professional ---                           I85.00, Esophageal varices without bleeding                           I86.4, Gastric varices                           K31.89, Other diseases of stomach and duodenum CPT copyright 2019 [employer]. All rights reserved. The codes documented in this report are preliminary and upon coder review may  be revised to meet current compliance requirements. Remo Lipps P. Havery Moros, MD 04/17/2020 1:24:02 PM This report has been signed electronically. Number of Addenda: 0

## 2020-04-19 ENCOUNTER — Encounter (HOSPITAL_COMMUNITY): Payer: Self-pay | Admitting: Gastroenterology

## 2020-04-25 ENCOUNTER — Telehealth: Payer: Self-pay | Admitting: Gastroenterology

## 2020-04-25 ENCOUNTER — Inpatient Hospital Stay (HOSPITAL_COMMUNITY)
Admission: EM | Admit: 2020-04-25 | Discharge: 2020-04-27 | DRG: 380 | Disposition: A | Discharge status: Home / Self Care | Payer: No Typology Code available for payment source | Attending: Internal Medicine | Admitting: Internal Medicine

## 2020-04-25 ENCOUNTER — Encounter (HOSPITAL_COMMUNITY): Payer: Self-pay

## 2020-04-25 ENCOUNTER — Encounter (HOSPITAL_COMMUNITY): Payer: Self-pay | Admitting: Certified Registered Nurse Anesthetist

## 2020-04-25 ENCOUNTER — Encounter (HOSPITAL_COMMUNITY)
Admission: EM | Disposition: A | Discharge status: Home / Self Care | Payer: Self-pay | Source: Home / Self Care | Attending: Internal Medicine

## 2020-04-25 ENCOUNTER — Other Ambulatory Visit: Payer: Self-pay

## 2020-04-25 DIAGNOSIS — Z794 Long term (current) use of insulin: Secondary | ICD-10-CM

## 2020-04-25 DIAGNOSIS — E1165 Type 2 diabetes mellitus with hyperglycemia: Secondary | ICD-10-CM | POA: Diagnosis present

## 2020-04-25 DIAGNOSIS — D61818 Other pancytopenia: Secondary | ICD-10-CM | POA: Diagnosis present

## 2020-04-25 DIAGNOSIS — Z800 Family history of malignant neoplasm of digestive organs: Secondary | ICD-10-CM

## 2020-04-25 DIAGNOSIS — K922 Gastrointestinal hemorrhage, unspecified: Secondary | ICD-10-CM | POA: Diagnosis present

## 2020-04-25 DIAGNOSIS — E860 Dehydration: Secondary | ICD-10-CM | POA: Diagnosis present

## 2020-04-25 DIAGNOSIS — N179 Acute kidney failure, unspecified: Secondary | ICD-10-CM | POA: Diagnosis not present

## 2020-04-25 DIAGNOSIS — N1831 Chronic kidney disease, stage 3a: Secondary | ICD-10-CM | POA: Diagnosis present

## 2020-04-25 DIAGNOSIS — D62 Acute posthemorrhagic anemia: Secondary | ICD-10-CM | POA: Diagnosis present

## 2020-04-25 DIAGNOSIS — I129 Hypertensive chronic kidney disease with stage 1 through stage 4 chronic kidney disease, or unspecified chronic kidney disease: Secondary | ICD-10-CM | POA: Diagnosis present

## 2020-04-25 DIAGNOSIS — K3189 Other diseases of stomach and duodenum: Secondary | ICD-10-CM | POA: Diagnosis present

## 2020-04-25 DIAGNOSIS — K7581 Nonalcoholic steatohepatitis (NASH): Secondary | ICD-10-CM | POA: Diagnosis present

## 2020-04-25 DIAGNOSIS — Z8719 Personal history of other diseases of the digestive system: Secondary | ICD-10-CM

## 2020-04-25 DIAGNOSIS — I959 Hypotension, unspecified: Secondary | ICD-10-CM | POA: Diagnosis present

## 2020-04-25 DIAGNOSIS — Z79899 Other long term (current) drug therapy: Secondary | ICD-10-CM

## 2020-04-25 DIAGNOSIS — D469 Myelodysplastic syndrome, unspecified: Secondary | ICD-10-CM | POA: Diagnosis present

## 2020-04-25 DIAGNOSIS — K219 Gastro-esophageal reflux disease without esophagitis: Secondary | ICD-10-CM | POA: Diagnosis present

## 2020-04-25 DIAGNOSIS — K766 Portal hypertension: Secondary | ICD-10-CM | POA: Diagnosis not present

## 2020-04-25 DIAGNOSIS — I8511 Secondary esophageal varices with bleeding: Secondary | ICD-10-CM | POA: Diagnosis present

## 2020-04-25 DIAGNOSIS — I8501 Esophageal varices with bleeding: Secondary | ICD-10-CM | POA: Diagnosis not present

## 2020-04-25 DIAGNOSIS — Z7982 Long term (current) use of aspirin: Secondary | ICD-10-CM | POA: Diagnosis not present

## 2020-04-25 DIAGNOSIS — I8510 Secondary esophageal varices without bleeding: Secondary | ICD-10-CM | POA: Diagnosis not present

## 2020-04-25 DIAGNOSIS — J449 Chronic obstructive pulmonary disease, unspecified: Secondary | ICD-10-CM | POA: Diagnosis present

## 2020-04-25 DIAGNOSIS — K7460 Unspecified cirrhosis of liver: Secondary | ICD-10-CM | POA: Diagnosis present

## 2020-04-25 DIAGNOSIS — F419 Anxiety disorder, unspecified: Secondary | ICD-10-CM | POA: Diagnosis present

## 2020-04-25 DIAGNOSIS — U071 COVID-19: Secondary | ICD-10-CM | POA: Diagnosis present

## 2020-04-25 DIAGNOSIS — K920 Hematemesis: Secondary | ICD-10-CM

## 2020-04-25 DIAGNOSIS — E1122 Type 2 diabetes mellitus with diabetic chronic kidney disease: Secondary | ICD-10-CM | POA: Diagnosis present

## 2020-04-25 DIAGNOSIS — K2211 Ulcer of esophagus with bleeding: Secondary | ICD-10-CM | POA: Diagnosis present

## 2020-04-25 DIAGNOSIS — E119 Type 2 diabetes mellitus without complications: Secondary | ICD-10-CM | POA: Diagnosis not present

## 2020-04-25 DIAGNOSIS — K7469 Other cirrhosis of liver: Secondary | ICD-10-CM | POA: Diagnosis present

## 2020-04-25 HISTORY — PX: ESOPHAGOGASTRODUODENOSCOPY: SHX5428

## 2020-04-25 LAB — CBC WITH DIFFERENTIAL/PLATELET
Abs Immature Granulocytes: 0.1 10*3/uL — ABNORMAL HIGH (ref 0.00–0.07)
Basophils Absolute: 0 10*3/uL (ref 0.0–0.1)
Basophils Relative: 1 %
Eosinophils Absolute: 0.1 10*3/uL (ref 0.0–0.5)
Eosinophils Relative: 3 %
HCT: 25.9 % — ABNORMAL LOW (ref 39.0–52.0)
Hemoglobin: 8.6 g/dL — ABNORMAL LOW (ref 13.0–17.0)
Immature Granulocytes: 3 %
Lymphocytes Relative: 11 %
Lymphs Abs: 0.4 10*3/uL — ABNORMAL LOW (ref 0.7–4.0)
MCH: 31.3 pg (ref 26.0–34.0)
MCHC: 33.2 g/dL (ref 30.0–36.0)
MCV: 94.2 fL (ref 80.0–100.0)
Monocytes Absolute: 0.2 10*3/uL (ref 0.1–1.0)
Monocytes Relative: 6 %
Neutro Abs: 3 10*3/uL (ref 1.7–7.7)
Neutrophils Relative %: 76 %
Platelets: 72 10*3/uL — ABNORMAL LOW (ref 150–400)
RBC: 2.75 MIL/uL — ABNORMAL LOW (ref 4.22–5.81)
RDW: 14.8 % (ref 11.5–15.5)
WBC: 3.8 10*3/uL — ABNORMAL LOW (ref 4.0–10.5)
nRBC: 0 % (ref 0.0–0.2)

## 2020-04-25 LAB — COMPREHENSIVE METABOLIC PANEL
ALT: 19 U/L (ref 0–44)
AST: 26 U/L (ref 15–41)
Albumin: 3 g/dL — ABNORMAL LOW (ref 3.5–5.0)
Alkaline Phosphatase: 77 U/L (ref 38–126)
Anion gap: 8 (ref 5–15)
BUN: 17 mg/dL (ref 8–23)
CO2: 26 mmol/L (ref 22–32)
Calcium: 8.6 mg/dL — ABNORMAL LOW (ref 8.9–10.3)
Chloride: 100 mmol/L (ref 98–111)
Creatinine, Ser: 2.12 mg/dL — ABNORMAL HIGH (ref 0.61–1.24)
GFR, Estimated: 33 mL/min — ABNORMAL LOW (ref >60)
Glucose, Bld: 374 mg/dL — ABNORMAL HIGH (ref 70–99)
Potassium: 5.5 mmol/L — ABNORMAL HIGH (ref 3.5–5.1)
Sodium: 134 mmol/L — ABNORMAL LOW (ref 135–145)
Total Bilirubin: 0.9 mg/dL (ref 0.3–1.2)
Total Protein: 6.7 g/dL (ref 6.5–8.1)

## 2020-04-25 LAB — ETHANOL: Alcohol, Ethyl (B): <10 mg/dL (ref <10)

## 2020-04-25 LAB — LIPASE, BLOOD: Lipase: 29 U/L (ref 11–51)

## 2020-04-25 LAB — POC SARS CORONAVIRUS 2 AG -  ED: SARS Coronavirus 2 Ag: POSITIVE — AB

## 2020-04-25 SURGERY — EGD (ESOPHAGOGASTRODUODENOSCOPY)
Anesthesia: Monitor Anesthesia Care

## 2020-04-25 MED ORDER — SODIUM CHLORIDE 0.9 % IV SOLN
50.0000 ug/h | INTRAVENOUS | Status: DC
  Administered 2020-04-25 – 2020-04-27 (×4): 50 ug/h via INTRAVENOUS
  Filled 2020-04-25 (×6): qty 1

## 2020-04-25 MED ORDER — SODIUM CHLORIDE 0.9 % IV SOLN
1.0000 g | INTRAVENOUS | Status: DC
  Administered 2020-04-25: 1 g via INTRAVENOUS
  Filled 2020-04-25: qty 10

## 2020-04-25 MED ORDER — PANTOPRAZOLE SODIUM 40 MG IV SOLR: 40.0000 mg | Freq: Two times a day (BID) | INTRAVENOUS | Status: DC

## 2020-04-25 MED ORDER — SODIUM CHLORIDE 0.9 % IV SOLN
8.0000 mg/h | INTRAVENOUS | Status: DC
  Administered 2020-04-25 – 2020-04-27 (×3): 8 mg/h via INTRAVENOUS
  Filled 2020-04-25 (×7): qty 80

## 2020-04-25 MED ORDER — SODIUM CHLORIDE 0.9 % IV BOLUS
1000.0000 mL | Freq: Once | INTRAVENOUS | Status: AC
  Administered 2020-04-25: 1000 mL via INTRAVENOUS

## 2020-04-25 MED ORDER — FENTANYL CITRATE (PF) 100 MCG/2ML IJ SOLN
INTRAMUSCULAR | Status: AC
  Filled 2020-04-25: qty 4

## 2020-04-25 MED ORDER — MIDAZOLAM HCL (PF) 10 MG/2ML IJ SOLN
2.0000 mg | Freq: Once | INTRAMUSCULAR | Status: AC
  Administered 2020-04-25: 2 mg via INTRAVENOUS

## 2020-04-25 MED ORDER — FENTANYL CITRATE (PF) 100 MCG/2ML IJ SOLN
25.0000 ug | Freq: Once | INTRAMUSCULAR | Status: AC
  Administered 2020-04-25: 25 ug via INTRAVENOUS

## 2020-04-25 MED ORDER — SODIUM CHLORIDE 0.9 % IV SOLN
80.0000 mg | Freq: Once | INTRAVENOUS | Status: AC
  Administered 2020-04-25: 80 mg via INTRAVENOUS
  Filled 2020-04-25: qty 80

## 2020-04-25 MED ORDER — MIDAZOLAM HCL (PF) 5 MG/ML IJ SOLN
INTRAMUSCULAR | Status: AC
  Filled 2020-04-25: qty 2

## 2020-04-25 NOTE — ED Notes (Signed)
Updated wife of pt and provided pt with personal cell phone in order to contact family.

## 2020-04-25 NOTE — ED Provider Notes (Signed)
[facility] DEPT Provider Note   CSN: [id number] Arrival date & time: 04/25/20  1729     History Chief Complaint  Patient presents with  . Hematemesis    [name] is a 70 y.o. male.  HPI Patient with a history of variceal banding 8 days ago, presents with hematemesis.  Patient has a history of cirrhosis, and 8 days ago had banding of 11 esophageal and gastric varices Today, after feeling generally well for the past few days, he had episode of unprovoked nausea, with subsequent vomiting of, by his account, 1 L of blood. No abdominal pain, no chest pain.  There was mild lightheadedness, but no syncope. Symptoms have subsequently resolved.     Past Medical History:  Diagnosis Date  . Allergy    seasonal allergies  . Anxiety    on meds  . Aortic valve disorder   . Arthritis    generalized (fingers)(shoulders)  . Asthma    uses inhaler  . Cataract    bilateral -sx   . Cirrhosis ([place])   . CKD (chronic kidney disease), stage III ([place])    only has one kidney  . COPD (chronic obstructive pulmonary disease) ([place])   . Depression    on meds  . DM (diabetes mellitus) ([place])    on meds  . GERD (gastroesophageal reflux disease)    on meds  . Heart murmur   . History of colon polyps   . Hx of acute pancreatitis 10/2019  . Hyperlipidemia    on meds  . Hypertension    on meds  . Hypothyroidism    not on meds at this time  . Myelodysplastic syndrome ([place])   . Neuromuscular disorder ([place])    per pt  . Obstructive sleep apnea   . Parkinsonism ([place])    per pt report  . Peripheral positional vertigo     Patient Active Problem List   Diagnosis Date Noted  . Esophageal varices in cirrhosis (HCC)   . Age-related nuclear cataract of right eye 09/15/2016  . Pseudophakia of left eye 09/15/2016  . Intraoperative floppy iris syndrome (IFIS) 01/13/2016  . 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 Acquired color vision deficiency 11/24/2015  . Nuclear  sclerosis of right eye 11/24/2015  . Open angle with borderline findings and high glaucoma risk in both eyes 11/24/2015  . Type 2 diabetes mellitus without complication, without long-term current use of insulin ([place]) 11/24/2015    Past Surgical History:  Procedure Laterality Date  . ANKLE SURGERY    . CHOLECYSTECTOMY    . ESOPHAGEAL BANDING N/A 04/17/2020   Procedure: ESOPHAGEAL BANDING;  Surgeon: Yetta Flock, MD;  Location: [facility];  Service: Gastroenterology;  Laterality: N/A;  . ESOPHAGOGASTRODUODENOSCOPY (EGD) WITH PROPOFOL N/A 04/17/2020   Procedure: ESOPHAGOGASTRODUODENOSCOPY (EGD) WITH PROPOFOL;  Surgeon: Yetta Flock, MD;  Location: [facility];  Service: Gastroenterology;  Laterality: N/A;  . EYE SURGERY    . HERNIA REPAIR    . KIDNEY STONE SURGERY    . WISDOM TOOTH EXTRACTION         Family History  Problem Relation Age of Onset  . Liver cancer Mother   . Colon cancer Neg Hx   . Stomach cancer Neg Hx     Social History   Tobacco Use  . Smoking status: Never Smoker  . Smokeless tobacco: Never Used  Vaping Use  . Vaping Use: Never used  Substance Use Topics  . Alcohol use: Not Currently  .  Drug use: Not Currently    Home Medications Prior to Admission medications   Medication Sig Start Date End Date Taking? Authorizing Provider  albuterol (VENTOLIN HFA) 108 (90 Base) MCG/ACT inhaler Inhale 2 puffs into the lungs daily as needed for wheezing or shortness of breath.    [provider]  ARTIFICIAL SALIVA MT Use as directed 4 sprays in the mouth or throat at bedtime.    [provider]  aspirin 81 MG chewable tablet Chew 81 mg by mouth daily.    [provider]  atorvastatin (LIPITOR) 20 MG tablet Take 20 mg by mouth at bedtime.    [provider]  buPROPion (WELLBUTRIN XL) 150 MG 24 hr tablet Take 150 mg by mouth daily.    [provider]  cetirizine (ZYRTEC) 10 MG chewable tablet Chew 10 mg by  mouth at bedtime.    [provider]  citalopram (CELEXA) 40 MG tablet Take 40 mg by mouth in the morning and at bedtime.    [provider]  diclofenac Sodium (VOLTAREN) 1 % GEL Apply 2 g topically 2 (two) times daily as needed (pain).    [provider]  ferrous sulfate 325 (65 FE) MG tablet Take 325 mg by mouth every Monday, Wednesday, and Friday.    [provider]  fluticasone (FLONASE) 50 MCG/ACT nasal spray Place 2 sprays into both nostrils daily.    [provider]  glucose 4 GM chewable tablet Chew 1 tablet by mouth as needed for low blood sugar (If drop below 70).    [provider]  HM LIDOCAINE PATCH EX Apply 1 patch topically every 12 (twelve) hours as needed (Pain). 5%    [provider]  Insulin Glargine (BASAGLAR KWIKPEN) 100 UNIT/ML Inject 12 Units into the skin daily.    [provider]  ipratropium (ATROVENT) 0.03 % nasal spray Place 2 sprays into both nostrils at bedtime.    [provider]  ketotifen (ZADITOR) 0.025 % ophthalmic solution Place 1 drop into both eyes at bedtime.    [provider]  losartan (COZAAR) 100 MG tablet Take 100 mg by mouth at bedtime.    [provider]  meclizine (ANTIVERT) 25 MG tablet Take 25 mg by mouth 2 (two) times daily.    [provider]  montelukast (SINGULAIR) 10 MG tablet Take by mouth daily.    [provider]  pantoprazole (PROTONIX) 40 MG tablet Take 40 mg by mouth daily.    [provider]  potassium citrate (UROCIT-K) 10 MEQ (1080 MG) SR tablet Take 10 mEq by mouth 2 (two) times daily.    [provider]  tamsulosin (FLOMAX) 0.4 MG CAPS capsule Take 0.4 mg by mouth 2 (two) times daily.    [provider]  traZODone (DESYREL) 150 MG tablet Take 75 mg by mouth at bedtime.    [provider]  vitamin B-12 (CYANOCOBALAMIN) 500 MCG tablet Take 500 mcg by mouth every other day.    [provider]    Allergies    Lisinopril  Review of Systems   Review of Systems  Constitutional:       Per HPI, otherwise negative  HENT:       Per HPI, otherwise negative  Respiratory:       Per HPI, otherwise negative  Cardiovascular:       Per HPI, otherwise negative  Gastrointestinal: Positive for vomiting. Negative for abdominal pain.  Endocrine:  Negative aside from HPI  Genitourinary:       Neg aside from HPI   Musculoskeletal:       Per HPI, otherwise negative  Skin: Positive for pallor.  Neurological: Negative for syncope.    Physical Exam Updated Vital Signs There were no vitals taken for this visit.  Physical Exam Vitals and nursing note reviewed.  Constitutional:      General: He is not in acute distress.    Appearance: He is well-developed.  HENT:     Head: Normocephalic and atraumatic.  Eyes:     Extraocular Movements: EOM normal.     Conjunctiva/sclera: Conjunctivae normal.  Cardiovascular:     Rate and Rhythm: Normal rate and regular rhythm.  Pulmonary:     Effort: Pulmonary effort is normal. No respiratory distress.     Breath sounds: No stridor.  Abdominal:     General: There is no distension.     Tenderness: There is no abdominal tenderness. There is no guarding.     Comments: Large, nontender abdomen  Musculoskeletal:        General: No edema.  Skin:    General: Skin is warm and dry.  Neurological:     Mental Status: He is alert and oriented to person, place, and time.  Psychiatric:        Mood and Affect: Mood and affect normal.     ED Results / Procedures / Treatments   Labs (all labs ordered are listed, but only abnormal results are displayed) Labs Reviewed  COMPREHENSIVE METABOLIC PANEL - Abnormal; Notable for the following components:      Result Value   Sodium 134 (*)    Potassium 5.5 (*)    Glucose, Bld 374 (*)    Creatinine, Ser 2.12 (*)    Calcium 8.6 (*)    Albumin 3.0 (*)    GFR, Estimated 33 (*)    All  other components within normal limits  CBC WITH DIFFERENTIAL/PLATELET - Abnormal; Notable for the following components:   WBC 3.8 (*)    RBC 2.75 (*)    Hemoglobin 8.6 (*)    HCT 25.9 (*)    Platelets 72 (*)    Lymphs Abs 0.4 (*)    Abs Immature Granulocytes 0.10 (*)    All other components within normal limits  POC SARS CORONAVIRUS 2 AG -  ED - Abnormal; Notable for the following components:   SARS Coronavirus 2 Ag POSITIVE (*)    All other components within normal limits  ETHANOL  LIPASE, BLOOD  TYPE AND SCREEN  ABO/RH    EKG EKG Interpretation  Date/Time:  Friday April 25 2020 17:56:37 EST Ventricular Rate:  72 PR Interval:    QRS Duration: 86 QT Interval:  408 QTC Calculation: 447 R Axis:   65 Text Interpretation: Sinus rhythm Multiple premature complexes, vent & supraven Abnormal ECG Confirmed by Carmin Muskrat [phone]) on 04/25/2020 6:00:27 PM   Radiology No results found.  Procedures Procedures   Medications Ordered in ED Medications  octreotide (SANDOSTATIN) 500 mcg in sodium chloride 0.9 % 250 mL (2 mcg/mL) infusion (50 mcg/hr Intravenous New Bag/Given 04/25/20 1844)  pantoprazole (PROTONIX) 80 mg in sodium chloride 0.9 % 100 mL (0.8 mg/mL) infusion (8 mg/hr Intravenous New Bag/Given 04/25/20 1852)  pantoprazole (PROTONIX) injection 40 mg ( Intravenous MAR Unhold 04/25/20 2228)  cefTRIAXone (ROCEPHIN) 1 g in sodium chloride 0.9 % 100 mL IVPB (has no administration in time range)  sodium chloride 0.9 % bolus  1,000 mL (1,000 mLs Intravenous New Bag/Given 04/25/20 1811)  pantoprazole (PROTONIX) 80 mg in sodium chloride 0.9 % 100 mL IVPB (80 mg Intravenous New Bag/Given 04/25/20 1855)  fentaNYL (SUBLIMAZE) injection 25 mcg (25 mcg Intravenous Given 04/25/20 2142)  midazolam PF (VERSED) injection 2 mg (2 mg Intravenous Given 04/25/20 2142)    ED Course  I have reviewed the triage vital signs and the nursing notes.  Pertinent labs & imaging results that were  available during my care of the patient were reviewed by me and considered in my medical decision making (see chart for details).  Elderly male presents less than 2 weeks after endoscopic procedure with banding of multiple varices, now with hematemesis, with concern for recurrent bleed patient was started on octreotide, pantoprazole, fluids, started on continuous monitoring, pulse oximetry.  Cardiac 70 sinus unremarkable Pulse oximetry 99% room air normal  Update:, Labs notable for hemoglobin 8.6 down from 10.32 months ago.  With concern for hematemesis, discuss case with our GI colleagues, patient will likely need procedure tonight.      9:09 PM Patient preparing for endoscopy.  10:36 PM Patient has had endoscopy, I have discussed this with the performing team.  Patient's variceal bands are in place.  Post procedure patient has mild hypotension, and given his creatinine elevation, hemoglobin drop, he will require admission for observation overnight. MDM Rules/Calculators/A&P MDM Number of Diagnoses or Management Options Hematemesis, presence of nausea not specified: new, needed workup Upper GI bleed: established, worsening   Amount and/or Complexity of Data Reviewed Clinical lab tests: reviewed Tests in the radiology section of CPT: reviewed Tests in the medicine section of CPT: reviewed Discussion of test results with the performing providers: yes Decide to obtain previous medical records or to obtain history from someone other than the patient: yes Review and summarize past medical records: yes Discuss the patient with other providers: yes Independent visualization of images, tracings, or specimens: yes  Risk of Complications, Morbidity, and/or Mortality Presenting problems: high Diagnostic procedures: high Management options: high  Critical Care Total time providing critical care: 30-74 minutes (35)  Patient Progress Patient progress: stable   Final Clinical  Impression(s) / ED Diagnoses Final diagnoses:  Upper GI bleed  Hematemesis, presence of nausea not specified      Carmin Muskrat, MD 04/25/20 2238

## 2020-04-25 NOTE — Consult Note (Signed)
Consult Note for  GI  Reason for Consult: Hematemesis Referring Physician: [facility]  Elwin Sleight HPI: This is a 70 year old male with a PMH of cirrhosis s/p primary prophylactic esophageal variceal banding (04/18/2019), gallstone pancreatitis, CKD, HTN, GERD, COPD, asthma, DM, and anxiety admitted for an acute onset of hematemesis.  Per my conversation with Dr. Luvenia Starch.  When he was eating lunch he started to experience coughing and retching and this was shortly followed by hematemesis.  The bleeding volume was reported to be significant.  In the ER it was documented that his HGB dropped from 10.3 g/dL (01/28/2020) down to his current admission value of 8.6 g/dL.  The EGD performed by Dr. Havery Moros was for primary prophylaxis as he had very large esophageal varices.  In the ER he was successfully resuscitated with IV hydration and his SBP increased from the 80's into the low 100's.  Incidentally he was identified to be COVID-19 positive.    Past Medical History:  Diagnosis Date   Allergy    seasonal allergies   Anxiety    on meds   Aortic valve disorder    Arthritis    generalized (fingers)(shoulders)   Asthma    uses inhaler   Cataract    bilateral -sx    Cirrhosis (HCC)    CKD (chronic kidney disease), stage III (HCC)    only has one kidney   COPD (chronic obstructive pulmonary disease) (HCC)    Depression    on meds   DM (diabetes mellitus) ([place])    on meds   GERD (gastroesophageal reflux disease)    on meds   Heart murmur    History of colon polyps    Hx of acute pancreatitis 10/2019   Hyperlipidemia    on meds   Hypertension    on meds   Hypothyroidism    not on meds at this time   Myelodysplastic syndrome ([place])    Neuromuscular disorder ([place])    per pt   Obstructive sleep apnea    Parkinsonism ([place])    per pt report   Peripheral positional vertigo     Past Surgical History:  Procedure Laterality Date   ANKLE SURGERY     CHOLECYSTECTOMY      ESOPHAGEAL BANDING N/A 04/17/2020   Procedure: ESOPHAGEAL BANDING;  Surgeon: Yetta Flock, MD;  Location: [facility];  Service: Gastroenterology;  Laterality: N/A;   ESOPHAGOGASTRODUODENOSCOPY (EGD) WITH PROPOFOL N/A 04/17/2020   Procedure: ESOPHAGOGASTRODUODENOSCOPY (EGD) WITH PROPOFOL;  Surgeon: Yetta Flock, MD;  Location: [facility];  Service: Gastroenterology;  Laterality: N/A;   EYE SURGERY     HERNIA REPAIR     KIDNEY STONE SURGERY     WISDOM TOOTH EXTRACTION      Family History  Problem Relation Age of Onset   Liver cancer Mother    Colon cancer Neg Hx    Stomach cancer Neg Hx     Social History:  reports that he has never smoked. He has never used smokeless tobacco. He reports previous alcohol use. He reports previous drug use.  Allergies:  Allergies  Allergen Reactions   Lisinopril Cough    Medications:  Scheduled:  [START ON 04/29/2020] pantoprazole  40 mg Intravenous Q12H   Continuous:  sodium chloride     octreotide  (SANDOSTATIN)    IV infusion 50 mcg/hr (04/25/20 1844)   pantoprozole (PROTONIX) infusion 8 mg/hr (04/25/20 1852)    Results for orders placed or performed during the hospital  encounter of 04/25/20 (from the past 24 hour(s))  Comprehensive metabolic panel     Status: Abnormal   Collection Time: 04/25/20  6:11 PM  Result Value Ref Range   Sodium 134 (L) 135 - 145 mmol/L   Potassium 5.5 (H) 3.5 - 5.1 mmol/L   Chloride 100 98 - 111 mmol/L   CO2 26 22 - 32 mmol/L   Glucose, Bld 374 (H) 70 - 99 mg/dL   BUN 17 8 - 23 mg/dL   Creatinine, Ser 2.12 (H) 0.61 - 1.24 mg/dL   Calcium 8.6 (L) 8.9 - 10.3 mg/dL   Total Protein 6.7 6.5 - 8.1 g/dL   Albumin 3.0 (L) 3.5 - 5.0 g/dL   AST 26 15 - 41 U/L   ALT 19 0 - 44 U/L   Alkaline Phosphatase 77 38 - 126 U/L   Total Bilirubin 0.9 0.3 - 1.2 mg/dL   GFR, Estimated 33 (L) >60 mL/min   Anion gap 8 5 - 15  Ethanol     Status: None   Collection Time: 04/25/20  6:11 PM  Result Value Ref  Range   Alcohol, Ethyl (B) <10 <10 mg/dL  Lipase, blood     Status: None   Collection Time: 04/25/20  6:11 PM  Result Value Ref Range   Lipase 29 11 - 51 U/L  CBC with Differential     Status: Abnormal   Collection Time: 04/25/20  6:11 PM  Result Value Ref Range   WBC 3.8 (L) 4.0 - 10.5 K/uL   RBC 2.75 (L) 4.22 - 5.81 MIL/uL   Hemoglobin 8.6 (L) 13.0 - 17.0 g/dL   HCT 25.9 (L) 39.0 - 52.0 %   MCV 94.2 80.0 - 100.0 fL   MCH 31.3 26.0 - 34.0 pg   MCHC 33.2 30.0 - 36.0 g/dL   RDW 14.8 11.5 - 15.5 %   Platelets 72 (L) 150 - 400 K/uL   nRBC 0.0 0.0 - 0.2 %   Neutrophils Relative % 76 %   Neutro Abs 3.0 1.7 - 7.7 K/uL   Lymphocytes Relative 11 %   Lymphs Abs 0.4 (L) 0.7 - 4.0 K/uL   Monocytes Relative 6 %   Monocytes Absolute 0.2 0.1 - 1.0 K/uL   Eosinophils Relative 3 %   Eosinophils Absolute 0.1 0.0 - 0.5 K/uL   Basophils Relative 1 %   Basophils Absolute 0.0 0.0 - 0.1 K/uL   Immature Granulocytes 3 %   Abs Immature Granulocytes 0.10 (H) 0.00 - 0.07 K/uL  Type and screen [place]     Status: None   Collection Time: 04/25/20  6:11 PM  Result Value Ref Range   ABO/RH(D) O POS    Antibody Screen NEG    Sample Expiration      04/28/2020,2359 Performed at [facility], [place] [address], [place], [place] [address]   POC SARS Coronavirus 2 Ag-ED - Nasal Swab     Status: Abnormal   Collection Time: 04/25/20  6:45 PM  Result Value Ref Range   SARS Coronavirus 2 Ag POSITIVE (A) NEGATIVE     No results found.  ROS:  As stated above in the HPI otherwise negative.  Blood pressure 112/60, pulse 67, temperature 97.9 F (36.6 C), temperature source Oral, resp. rate 19, height 5\' 7"  (1.702 m), weight 77.1 kg, SpO2 93 %.    PE: Gen: NAD, Alert and Oriented HEENT:  [place]/AT, EOMI Neck: Supple, no LAD Lungs: CTA Bilaterally CV: RRR  without M/G/R ABD: Soft, NTND, +BS Ext: No C/C/E  Assessment/Plan: 1) Hematemesis. 2) Anemia. 3) Cirrhosis -  presumably from NASH.   The patient is hemodynamically stable.  With the severity of his hematemesis a repeat EGD will be performed.  As a point of clarification the patient states that his hematemesis started shortly after breakfast.  He vomited 3 times and he is not certain why he did not present to the ER sooner.  Dr. Havery Moros felt that he could have placed more bands during this initial banding.  With the history of the coughing and retching he may be bleeding from a Mallory-Weiss tear.  Post banding esophageal ulcerations are a consideration and he may also be bleeding from his esophageal varices.  Plan: 1) EGD now. 2) Ceftriaxone 1 gram QD to prophylax in the setting of a GI bleed with cirrhosis. 3) Monitor HGB. Do not transfuse above 10 g/dL. 4) Agree with octreotide. Cypress Hinkson D 04/25/2020, 8:51 PM

## 2020-04-25 NOTE — Telephone Encounter (Signed)
Dr. Havery Moros, just an FYI. Thanks.   Spoke with patient's wife, she states that pt hasn't felt well all day, he took 4-5 bites of bologna sandwich, she states that he fell out of the chair - not sure if lost consciousness and has been vomiting bright red blood and blood clots, wife states that patient takes an aspirin every night. Patient was audibly vomiting in the background, advised her to take him to the nearest  ED, she states that the [facility] hospital has nothing. I told wife to call the ambulance, I advised that we have GI consults at [facility] but I did not know how far that would be for them. Advised that he needed to be seen ASAP for evaluation.

## 2020-04-25 NOTE — Op Note (Signed)
[facility] Patient Name: Corey Wood Procedure Date: 04/25/2020 MRN: [id number] Attending MD: Carol Ada , MD Date of Birth: 1950/10/09 CSN: [id number] Age: 70 Admit Type: Inpatient Procedure:                Upper GI endoscopy Indications:              Hematemesis Providers:                Carol Ada, MD, Erenest Rasher, RN, Elspeth Cho                            Tech., Technician Referring MD:              Medicines:                Fentanyl 25 micrograms IV, Midazolam 2 mg IV Complications:            No immediate complications. Estimated Blood Loss:     Estimated blood loss: none. Procedure:                Pre-Anesthesia Assessment:                           - Prior to the procedure, a History and Physical                            was performed, and patient medications and                            allergies were reviewed. The patient's tolerance of                            previous anesthesia was also reviewed. The risks                            and benefits of the procedure and the sedation                            options and risks were discussed with the patient.                            All questions were answered, and informed consent                            was obtained. Prior Anticoagulants: The patient has                            taken no previous anticoagulant or antiplatelet                            agents. ASA Grade Assessment: III - A patient with                            severe systemic disease. After reviewing the risks  and benefits, the patient was deemed in                            satisfactory condition to undergo the procedure.                           - Sedation was administered by an endoscopy nurse.                            The sedation level attained was moderate.                           After obtaining informed consent, the endoscope was                            passed under  direct vision. Throughout the                            procedure, the patient's blood pressure, pulse, and                            oxygen saturations were monitored continuously. The                            GIF-H190 ([id number] Olympus gastroscope was                            introduced through the mouth, and advanced to the                            second part of duodenum. The upper GI endoscopy was                            accomplished without difficulty. The patient                            tolerated the procedure well. Scope In: Scope Out: Findings:      Large (> 5 mm) varices were found in the entire esophagus.      Few superficial esophageal ulcers with no stigmata of recent bleeding       were found in the middle third of the esophagus. The largest lesion was       5 mm in largest dimension.      Mild portal hypertensive gastropathy was found in the gastric body.      A medium amount of food (residue) was found in the gastric fundus.      The examined duodenum was normal.      There was evidence of hematin in the gastric lumen mixed with some solid       and semi-solid gastric contents. A clear evaluate of the fundus was not       possible as the material obscured the area. In the esophoagus the post       banding ulcerations and anticipated variceal banding necrosis was       identified. There was no evidence of any active bleeding in this  area.       The esophageal varices were very large, but there was no evidence of any       red wale signs. The GE junction was normal and there was no evidence of       a Mallory-Weiss tear. Impression:               - Large (> 5 mm) esophageal varices.                           - Esophageal ulcers with no stigmata of recent                            bleeding.                           - Portal hypertensive gastropathy.                           - A medium amount of food (residue) in the stomach.                           -  Normal examined duodenum.                           - No specimens collected. Moderate Sedation:      Moderate (conscious) sedation was administered by the endoscopy nurse       and supervised by the endoscopist. The patient's oxygen saturation,       heart rate, blood pressure and response to care were monitored. Recommendation:           - Return patient to hospital ward for ongoing care.                           - Clear liquid diet.                           - Continue present medications.                           - Follow HGB and transfuse if necessary.                           - Do not transfuse above 10 g/dL.                           - Ceftriaxone 1 gram QD.                           - Continue with octreotide.                           - Okay with pantoprazole.                           - If rebleeding occurs, then a repeat EGD with  banding will be performed. Otherwise it is better                            to monitor the patient while keeping his HGB                            between 8-10 g/dL, which keeps the portal pressures                            lower. Procedure Code(s):        --- Professional ---                           [phone], Esophagogastroduodenoscopy, flexible,                            transoral; diagnostic, including collection of                            specimen(s) by brushing or washing, when performed                            (separate procedure) Diagnosis Code(s):        --- Professional ---                           I85.00, Esophageal varices without bleeding                           K22.10, Ulcer of esophagus without bleeding                           K76.6, Portal hypertension                           K31.89, Other diseases of stomach and duodenum                           K92.0, Hematemesis CPT copyright 2019 [employer]. All rights reserved. The codes documented in this report are preliminary  and upon coder review may  be revised to meet current compliance requirements. Carol Ada, MD Carol Ada, MD 04/25/2020 9:58:49 PM This report has been signed electronically. Number of Addenda: 0

## 2020-04-25 NOTE — Telephone Encounter (Signed)
Pt's spouse is calling states the pt had an EGD done on 04/17/2020 but the pt threw up today blood which looked like clots. Caller is requesting a call back to advise what to do.

## 2020-04-25 NOTE — ED Triage Notes (Signed)
Pt came from home via EMS. Pt began vomiting copious amounts of frank blood about 1500. Per EMS, half a small trash can full and 1/4 of a kitchen trash bag full of blood.  Pt had an endoscopy procedure on 04/17/20 here.   A&O x4 102/ 40 82 bpm 91% on RA

## 2020-04-25 NOTE — ED Notes (Signed)
Notified pharmacy that the Rocephin due at 2200 was still unverified, they said they would verify the medication as soon as possible.

## 2020-04-25 NOTE — Telephone Encounter (Signed)
Brooklyn thanks for letting me know.  I called the patient's wife who updated me on what is going on.  She states he was eating lunch and had a significant coughing/retching episode, then developed hematemesis which she states appears significant.  She has already called 911 and the ambulance has taken him to the hospital, she thinks they are going to [facility].  I discussed the situation with her.  Suspect he is having bleeding from banding site ulcer / different portion of the varix or Mallory-Weiss tear from the history she provides.  He had very large varices that were banded about a week ago, states he had been doing well until he had the significant coughing/retching episode.  I advised them that I would let the on-call team know he is coming in, and the emergency department would contact us, he will need resuscitation acutely, PPI, octreotide, etc, and intubation if severely bleeding to protect his airway.

## 2020-04-26 ENCOUNTER — Observation Stay (HOSPITAL_COMMUNITY): Payer: No Typology Code available for payment source

## 2020-04-26 ENCOUNTER — Encounter (HOSPITAL_COMMUNITY): Payer: Self-pay | Admitting: Internal Medicine

## 2020-04-26 DIAGNOSIS — K7460 Unspecified cirrhosis of liver: Secondary | ICD-10-CM

## 2020-04-26 DIAGNOSIS — E119 Type 2 diabetes mellitus without complications: Secondary | ICD-10-CM

## 2020-04-26 DIAGNOSIS — I8510 Secondary esophageal varices without bleeding: Secondary | ICD-10-CM

## 2020-04-26 DIAGNOSIS — N179 Acute kidney failure, unspecified: Secondary | ICD-10-CM | POA: Diagnosis present

## 2020-04-26 DIAGNOSIS — D62 Acute posthemorrhagic anemia: Secondary | ICD-10-CM | POA: Diagnosis present

## 2020-04-26 DIAGNOSIS — K922 Gastrointestinal hemorrhage, unspecified: Secondary | ICD-10-CM | POA: Diagnosis present

## 2020-04-26 LAB — GLUCOSE, CAPILLARY
Glucose-Capillary: 185 mg/dL — ABNORMAL HIGH (ref 70–99)
Glucose-Capillary: 195 mg/dL — ABNORMAL HIGH (ref 70–99)
Glucose-Capillary: 202 mg/dL — ABNORMAL HIGH (ref 70–99)

## 2020-04-26 LAB — CBC
HCT: 20.8 % — ABNORMAL LOW (ref 39.0–52.0)
HCT: 23.6 % — ABNORMAL LOW (ref 39.0–52.0)
HCT: 23.5 % — ABNORMAL LOW (ref 39.0–52.0)
Hemoglobin: 6.9 g/dL — CL (ref 13.0–17.0)
Hemoglobin: 7.7 g/dL — ABNORMAL LOW (ref 13.0–17.0)
Hemoglobin: 7.8 g/dL — ABNORMAL LOW (ref 13.0–17.0)
MCH: 31.4 pg (ref 26.0–34.0)
MCH: 30.7 pg (ref 26.0–34.0)
MCH: 30.7 pg (ref 26.0–34.0)
MCHC: 33.2 g/dL (ref 30.0–36.0)
MCHC: 32.6 g/dL (ref 30.0–36.0)
MCHC: 33.2 g/dL (ref 30.0–36.0)
MCV: 94.5 fL (ref 80.0–100.0)
MCV: 94 fL (ref 80.0–100.0)
MCV: 92.5 fL (ref 80.0–100.0)
Platelets: 63 10*3/uL — ABNORMAL LOW (ref 150–400)
Platelets: 63 10*3/uL — ABNORMAL LOW (ref 150–400)
Platelets: 63 10*3/uL — ABNORMAL LOW (ref 150–400)
RBC: 2.2 MIL/uL — ABNORMAL LOW (ref 4.22–5.81)
RBC: 2.51 MIL/uL — ABNORMAL LOW (ref 4.22–5.81)
RBC: 2.54 MIL/uL — ABNORMAL LOW (ref 4.22–5.81)
RDW: 14.9 % (ref 11.5–15.5)
RDW: 15.1 % (ref 11.5–15.5)
RDW: 15.6 % — ABNORMAL HIGH (ref 11.5–15.5)
WBC: 2.1 10*3/uL — ABNORMAL LOW (ref 4.0–10.5)
WBC: 2.4 10*3/uL — ABNORMAL LOW (ref 4.0–10.5)
WBC: 2.5 10*3/uL — ABNORMAL LOW (ref 4.0–10.5)
nRBC: 0 % (ref 0.0–0.2)
nRBC: 0 % (ref 0.0–0.2)
nRBC: 0 % (ref 0.0–0.2)

## 2020-04-26 LAB — HEMOGLOBIN AND HEMATOCRIT, BLOOD
HCT: 23.8 % — ABNORMAL LOW (ref 39.0–52.0)
HCT: 24.2 % — ABNORMAL LOW (ref 39.0–52.0)
Hemoglobin: 8 g/dL — ABNORMAL LOW (ref 13.0–17.0)
Hemoglobin: 7.9 g/dL — ABNORMAL LOW (ref 13.0–17.0)

## 2020-04-26 LAB — CBG MONITORING, ED
Glucose-Capillary: 226 mg/dL — ABNORMAL HIGH (ref 70–99)
Glucose-Capillary: 180 mg/dL — ABNORMAL HIGH (ref 70–99)
Glucose-Capillary: 177 mg/dL — ABNORMAL HIGH (ref 70–99)
Glucose-Capillary: 171 mg/dL — ABNORMAL HIGH (ref 70–99)
Glucose-Capillary: 162 mg/dL — ABNORMAL HIGH (ref 70–99)

## 2020-04-26 LAB — PREPARE RBC (CROSSMATCH)

## 2020-04-26 MED ORDER — TRAMADOL HCL 50 MG PO TABS
50.0000 mg | ORAL_TABLET | Freq: Once | ORAL | Status: AC
  Administered 2020-04-26: 50 mg via ORAL
  Filled 2020-04-26: qty 1

## 2020-04-26 MED ORDER — SODIUM CHLORIDE 0.9 % IV SOLN
1.0000 g | INTRAVENOUS | Status: DC
  Filled 2020-04-26: qty 10

## 2020-04-26 MED ORDER — SODIUM CHLORIDE 0.9% IV SOLUTION: Freq: Once | INTRAVENOUS | Status: AC

## 2020-04-26 MED ORDER — SODIUM CHLORIDE 0.9 % IV SOLN
2.0000 g | INTRAVENOUS | Status: DC
  Administered 2020-04-26: 2 g via INTRAVENOUS
  Filled 2020-04-26: qty 20

## 2020-04-26 MED ORDER — SODIUM CHLORIDE 0.9 % IV SOLN: INTRAVENOUS | Status: DC

## 2020-04-26 MED ORDER — ALBUTEROL SULFATE HFA 108 (90 BASE) MCG/ACT IN AERS: 2.0000 | INHALATION_SPRAY | Freq: Every day | RESPIRATORY_TRACT | Status: DC | PRN

## 2020-04-26 MED ORDER — INSULIN ASPART 100 UNIT/ML ~~LOC~~ SOLN
0.0000 [IU] | SUBCUTANEOUS | Status: DC
  Administered 2020-04-26: 1 [IU] via SUBCUTANEOUS
  Administered 2020-04-26 (×4): 2 [IU] via SUBCUTANEOUS
  Administered 2020-04-26: 3 [IU] via SUBCUTANEOUS
  Administered 2020-04-27: 1 [IU] via SUBCUTANEOUS
  Administered 2020-04-27: 2 [IU] via SUBCUTANEOUS
  Administered 2020-04-27: 3 [IU] via SUBCUTANEOUS
  Filled 2020-04-26: qty 0.09

## 2020-04-26 NOTE — Progress Notes (Signed)
PROGRESS NOTE  Corey Wood HBZ:[id number] DOB: 1950-07-01 DOA: 04/25/2020 PCP: [name], MD   LOS: 1 day   Brief narrative:  Corey Wood is a 70 y.o. male with history of cirrhosis of the liver, diabetes mellitus type 2, chronic kidney disease stage III, MDS with pancytopenia presented to the ER with multiple episodes of hematemesis without abdominal pain.    Patient had prophylactic EGD last week when varices were seen and was done banding. In the ER patient initially was hypotensive improved with fluids.  Hemoglobin at around November was around 10.1 dropped down to 8.6.  Was then transfused with PRBC and GI was consulted.  Patient underwent EGD which showed varices and ulcerations in the esophagus.  Started on octreotide, Protonix infusion and was admitted to hospital for evaluation.  Patient tested positive for Covid but was asymptomatic.  Assessment/Plan:  Principal Problem:   Acute GI bleeding Active Problems:   Type 2 diabetes mellitus without complication, without long-term current use of insulin (HCC)   Esophageal varices in cirrhosis ([place])   ARF (acute renal failure) (HCC)   Acute blood loss anemia   Acute blood loss anemia secondary to acute GI bleeding with history of cirrhosis of liver and esophageal varices.  History of recent EGD and variceal banding.  EGD repeat showed large varices and ulcerations.  No further procedure was done.  Continue octreotide, PPI.  Transfuse as necessary.  On IV Rocephin for SBP coverage.  Hold aspirin.  Continue to monitor hemoglobin closely.  Asymptomatic COVID-19 positive.  Will monitor inflammatory markers and x-ray.  Pancytopenia with history of MDS .  Transfuse as needed.  We will continue to monitor closely  Diabetes mellitus type 2 with hyperglycemia.  Continue sliding scale insulin, Accu-Cheks, Lantus.  Patient is on Lantus at home.  ]Acute kidney injury on chronic kidney disease stage IIIa-likely from GI bleed,  hypotension.  Continue to monitor closely.  Avoid nephrotoxic medication.  Intake and output charting.   DVT prophylaxis: SCDs Start: 04/26/20 0136    Code Status: Full code  Family Communication: None  Status is: Observation  The patient will require care spanning > 2 midnights and should be moved to inpatient because: IV treatments appropriate due to intensity of illness or inability to take PO, Inpatient level of care appropriate due to severity of illness and Closer monitoring, IV Protonix, octreotide  Dispo: The patient is from: Home              Anticipated d/c is to: Home              Anticipated d/c date is: 2 days              Patient currently is not medically stable to d/c.   Difficult to place patient No  Consultants:  GI  Procedures:  EGD on 1/28  Anti-infectives:  [name] Kitchen Rocephin IV 1/28>  Anti-infectives (From admission, onward)   Start     Dose/Rate Route Frequency Ordered Stop   04/25/20 2200  cefTRIAXone (ROCEPHIN) 1 g in sodium chloride 0.9 % 100 mL IVPB        1 g 200 mL/hr over 30 Minutes Intravenous Every 24 hours 04/25/20 2156         Subjective: Today, patient was seen and examined at bedside.  Patient denies any nausea vomiting abdominal pain hematemesis today.  Objective: Vitals:   04/26/20 0715 04/26/20 0719  BP: 129/62   Pulse: 60 64  Resp: 20 18  Temp:    SpO2:  97%    Intake/Output Summary (Last 24 hours) at 04/26/2020 0731 Last data filed at 04/26/2020 0351 Gross per 24 hour  Intake 329 ml  Output -  Net 329 ml   Filed Weights   04/25/20 1753  Weight: 77.1 kg   Body mass index is 26.63 kg/m.   Physical Exam:  GENERAL: Patient is alert awake and oriented. Not in obvious distress. HENT: Mild pallor noted. Pupils equally reactive to light. Oral mucosa is moist NECK: is supple, no gross swelling noted. CHEST: Clear to auscultation. No crackles or wheezes.  Diminished breath sounds bilaterally. CVS: S1 and S2 heard, no  murmur. Regular rate and rhythm.  ABDOMEN: Soft, non-tender, bowel sounds are present. EXTREMITIES: No edema. CNS: Cranial nerves are intact. No focal motor deficits. SKIN: warm and dry without rashes.  Data Review: I have personally reviewed the following laboratory data and studies,  CBC: Recent Labs  Lab 04/25/20 1811 04/26/20 0136 04/26/20 0536  WBC 3.8* 2.1* 2.4*  NEUTROABS 3.0  --   --   HGB 8.6* 6.9* 7.7*  HCT 25.9* 20.8* 23.6*  MCV 94.2 94.5 94.0  PLT 72* 63* 63*   Basic Metabolic Panel: Recent Labs  Lab 04/25/20 1811  NA 134*  K 5.5*  CL 100  CO2 26  GLUCOSE 374*  BUN 17  CREATININE 2.12*  CALCIUM 8.6*   Liver Function Tests: Recent Labs  Lab 04/25/20 1811  AST 26  ALT 19  ALKPHOS 77  BILITOT 0.9  PROT 6.7  ALBUMIN 3.0*   Recent Labs  Lab 04/25/20 1811  LIPASE 29   No results for input(s): AMMONIA in the last 168 hours. Cardiac Enzymes: No results for input(s): CKTOTAL, CKMB, CKMBINDEX, TROPONINI in the last 168 hours. BNP (last 3 results) No results for input(s): BNP in the last 8760 hours.  ProBNP (last 3 results) No results for input(s): PROBNP in the last 8760 hours.  CBG: Recent Labs  Lab 04/26/20 0213 04/26/20 0419 04/26/20 0533  GLUCAP 226* 180* 177*   No results found for this or any previous visit (from the past 240 hour(s)).   Studies: No results found.    Flora Lipps, MD  [facility] 04/26/2020  If 7PM-7AM, please contact night-coverage

## 2020-04-26 NOTE — Plan of Care (Signed)

## 2020-04-26 NOTE — Progress Notes (Signed)
Progress Note for [facility]  Subjective: No further hematemesis.  He denies any melena.  Overall he feels well.  Objective: Vital signs in last 24 hours: Temp:  [97.7 F (36.5 C)-98.5 F (36.9 C)] 98.5 F (36.9 C) (01/29 1537) Pulse Rate:  [56-73] 63 (01/29 1537) Resp:  [13-27] 18 (01/29 1537) BP: (81-146)/(47-96) 142/72 (01/29 1537) SpO2:  [87 %-98 %] 95 % (01/29 1537) Weight:  [77.1 kg] 77.1 kg (01/28 1753)    Intake/Output from previous day: 01/28 0701 - 01/29 0700 In: 329 [I.V.:10; Blood:319] Out: -  Intake/Output this shift: Total I/O In: 624.5 [I.V.:309.5; Blood:315] Out: -   General appearance: alert and no distress GI: soft, non-tender; bowel sounds normal; no masses,  no organomegaly  Lab Results: Recent Labs    04/26/20 0136 04/26/20 0536 04/26/20 0900 04/26/20 1158  WBC 2.1* 2.4* 2.5*  --   HGB 6.9* 7.7* 7.8* 8.0*  HCT 20.8* 23.6* 23.5* 23.8*  PLT 63* 63* 63*  --    BMET Recent Labs    04/25/20 1811  NA 134*  K 5.5*  CL 100  CO2 26  GLUCOSE 374*  BUN 17  CREATININE 2.12*  CALCIUM 8.6*   LFT Recent Labs    04/25/20 1811  PROT 6.7  ALBUMIN 3.0*  AST 26  ALT 19  ALKPHOS 77  BILITOT 0.9   PT/INR No results for input(s): LABPROT, INR in the last 72 hours. Hepatitis Panel No results for input(s): HEPBSAG, HCVAB, HEPAIGM, HEPBIGM in the last 72 hours. C-Diff No results for input(s): CDIFFTOX in the last 72 hours. Fecal Lactopherrin No results for input(s): FECLLACTOFRN in the last 72 hours.  Studies/Results: DG CHEST PORT 1 VIEW  Result Date: 04/26/2020 CLINICAL DATA:  Hemoptysis EXAM: PORTABLE CHEST 1 VIEW COMPARISON:  11/06/2019 FINDINGS: Small left pleural effusion. Generous heart size accentuated by technique. No air bronchogram or pulmonary edema. No pneumothorax. Extensive artifact from EKG leads. IMPRESSION: Small left pleural effusion. Electronically Signed   By: Monte Fantasia M.D.   On: 04/26/2020 07:50    Medications:   Scheduled: . insulin aspart  0-9 Units Subcutaneous Q4H  . [START ON 04/29/2020] pantoprazole  40 mg Intravenous Q12H   Continuous: . sodium chloride    . cefTRIAXone (ROCEPHIN)  IV 2 g (04/26/20 1541)  . octreotide  (SANDOSTATIN)    IV infusion 50 mcg/hr (04/26/20 1545)  . pantoprozole (PROTONIX) infusion 8 mg/hr (04/25/20 1852)    Assessment/Plan: 1) Presumed bleeding from esophageal variceal banding ulcer. 2) Anemia - stable. 3) Incidental COVID-19 infection. 4) Cirrhosis.   He is well.  No evidence of any ongoing bleeding.  Plan: 1) Advance to a regular diet. 2) Monitor HGB.  If stable tomorrow he can be discharged home. 3) Continue with ceftriaxone.  Upon discharge he will need to be on antibiotics for a total of 7 days.  He can be discharged on Cipro 500 mg BID.   LOS: 1 day   Rondia Higginbotham D 04/26/2020, 3:47 PM

## 2020-04-26 NOTE — ED Notes (Signed)
After receiving notice of a critical hemoglobin value of 6.9 at 0240, this RN attempted to call and message the hospitalist responsible for the pt. When unable to get through, contacted floor coverage through [place]. Awaiting response.

## 2020-04-26 NOTE — Plan of Care (Signed)

## 2020-04-26 NOTE — H&P (Addendum)
History and Physical    Corey Wood [id number]:[id number] DOB: [birth year] DOA: 04/25/2020  PCP: Corey Milan, MD  Patient coming from: Home.  Chief Complaint: Hematemesis.  HPI: Corey Wood is a 70 y.o. male with history of cirrhosis of the liver, diabetes mellitus type 2, chronic kidney disease stage III, MDS with pancytopenia presents to the ER after patient had multiple episodes of hematemesis.  Patient states he felt weak denies any abdominal pain denies any black stools.  Patient had prophylactic EGD last week when varices were seen and was done banding.  ED Course: In the ER patient initially was hypotensive improved with fluids.  Hemoglobin at around November was around 10.1 dropped down to 8.6 here was transfused with PRBC and gastroenterologist Dr. Benson Norway data EGD showed varices and ulcerations in the esophagus.  Patient was started on octreotide Protonix infusion and admitted for further observation.  Checking serial CBC.  Patient's Covid test turned out to be positive but patient is not symptomatic.  Review of Systems: As per HPI, rest all negative.   Past Medical History:  Diagnosis Date  . Allergy    seasonal allergies  . Anxiety    on meds  . Aortic valve disorder   . Arthritis    generalized (fingers)(shoulders)  . Asthma    uses inhaler  . Cataract    bilateral -sx   . Cirrhosis ([place])   . CKD (chronic kidney disease), stage III ([place])    only has one kidney  . COPD (chronic obstructive pulmonary disease) ([place])   . Depression    on meds  . DM (diabetes mellitus) ([place])    on meds  . GERD (gastroesophageal reflux disease)    on meds  . Heart murmur   . History of colon polyps   . Hx of acute pancreatitis 10/2019  . Hyperlipidemia    on meds  . Hypertension    on meds  . Hypothyroidism    not on meds at this time  . Myelodysplastic syndrome ([place])   . Neuromuscular disorder ([place])    per pt  . Obstructive sleep apnea   . Parkinsonism ([place])    per pt  report  . Peripheral positional vertigo     Past Surgical History:  Procedure Laterality Date  . ANKLE SURGERY    . CHOLECYSTECTOMY    . ESOPHAGEAL BANDING N/A 04/17/2020   Procedure: ESOPHAGEAL BANDING;  Surgeon: Yetta Flock, MD;  Location: [facility];  Service: Gastroenterology;  Laterality: N/A;  . ESOPHAGOGASTRODUODENOSCOPY (EGD) WITH PROPOFOL N/A 04/17/2020   Procedure: ESOPHAGOGASTRODUODENOSCOPY (EGD) WITH PROPOFOL;  Surgeon: Yetta Flock, MD;  Location: [facility];  Service: Gastroenterology;  Laterality: N/A;  . EYE SURGERY    . HERNIA REPAIR    . KIDNEY STONE SURGERY    . WISDOM TOOTH EXTRACTION       reports that he has never smoked. He has never used smokeless tobacco. He reports previous alcohol use. He reports previous drug use.  Allergies  Allergen Reactions  . Lisinopril Cough    Family History  Problem Relation Age of Onset  . Liver cancer Mother   . Colon cancer Neg Hx   . Stomach cancer Neg Hx     Prior to Admission medications   Medication Sig Start Date End Date Taking? Authorizing Provider  albuterol (VENTOLIN HFA) 108 (90 Base) MCG/ACT inhaler Inhale 2 puffs into the lungs daily as needed for wheezing or shortness of breath.   Yes [provider]  ARTIFICIAL SALIVA MT Use as directed 4 sprays in the mouth or throat at bedtime.   Yes [provider]  aspirin 81 MG chewable tablet Chew 81 mg by mouth daily.   Yes [provider]  atorvastatin (LIPITOR) 20 MG tablet Take 20 mg by mouth at bedtime.   Yes [provider]  buPROPion (WELLBUTRIN XL) 150 MG 24 hr tablet Take 150 mg by mouth daily.   Yes [provider]  cetirizine (ZYRTEC) 10 MG chewable tablet Chew 10 mg by mouth at bedtime.   Yes [provider]  diclofenac Sodium (VOLTAREN) 1 % GEL Apply 2 g topically 2 (two) times daily as needed (pain).   Yes [provider]  ferrous sulfate 325 (65 FE) MG tablet Take 325 mg  by mouth every Monday, Wednesday, and Friday.   Yes [provider]  fluticasone (FLONASE) 50 MCG/ACT nasal spray Place 2 sprays into both nostrils daily.   Yes [provider]  glucose 4 GM chewable tablet Chew 1 tablet by mouth daily as needed for low blood sugar (If drop below 70).   Yes [provider]  HM LIDOCAINE PATCH EX Apply 1 patch topically every 12 (twelve) hours as needed (Pain). 5%   Yes [provider]  insulin glargine (LANTUS) 100 unit/mL SOPN Inject 12 Units into the skin daily.   Yes [provider]  ipratropium (ATROVENT) 0.03 % nasal spray Place 2 sprays into both nostrils at bedtime.   Yes [provider]  ketotifen (ZADITOR) 0.025 % ophthalmic solution Place 1 drop into both eyes at bedtime.   Yes [provider]  losartan (COZAAR) 100 MG tablet Take 100 mg by mouth at bedtime.   Yes [provider]  meclizine (ANTIVERT) 25 MG tablet Take 25 mg by mouth 2 (two) times daily.   Yes [provider]  montelukast (SINGULAIR) 10 MG tablet Take 10 mg by mouth daily.   Yes [provider]  pantoprazole (PROTONIX) 40 MG tablet Take 40 mg by mouth daily.   Yes [provider]  potassium citrate (UROCIT-K) 10 MEQ (1080 MG) SR tablet Take 10 mEq by mouth 2 (two) times daily.   Yes [provider]  tamsulosin (FLOMAX) 0.4 MG CAPS capsule Take 0.4 mg by mouth 2 (two) times daily.   Yes [provider]  traZODone (DESYREL) 150 MG tablet Take 75 mg by mouth at bedtime.   Yes [provider]  vitamin B-12 (CYANOCOBALAMIN) 500 MCG tablet Take 500 mcg by mouth every other day.   Yes [provider]    Physical Exam: Constitutional: Moderately built and nourished. Vitals:   04/25/20 2205 04/25/20 2230 04/25/20 2245 04/26/20 0115  BP: (!) 98/59 (!) 104/55 103/60 112/82  Pulse: 70 66 66 64  Resp: 18 18 (!) 24 13  Temp:      TempSrc:      SpO2: 94% 94% 90%  92%  Weight:      Height:       Eyes: Anicteric no pallor. ENMT: No discharge from the ears eyes nose or mouth. Neck: No mass felt.  No neck rigidity. Respiratory: No rhonchi or crepitations. Cardiovascular: S1-S2 heard. Abdomen: Soft nontender bowel sounds present. Musculoskeletal: No edema. Skin: No rash. Neurologic: Alert awake oriented to time place and person.  Moves all extremities. Psychiatric: Appears normal.  Normal affect.   Labs on Admission: I have personally reviewed following labs and imaging studies  CBC: Recent  Labs  Lab 04/25/20 1811  WBC 3.8*  NEUTROABS 3.0  HGB 8.6*  HCT 25.9*  MCV 94.2  PLT 72*   Basic Metabolic Panel: Recent Labs  Lab 04/25/20 1811  NA 134*  K 5.5*  CL 100  CO2 26  GLUCOSE 374*  BUN 17  CREATININE 2.12*  CALCIUM 8.6*   GFR: Estimated Creatinine Clearance: 29.9 mL/min (A) (by C-G formula based on SCr of 2.12 mg/dL (H)). Liver Function Tests: Recent Labs  Lab 04/25/20 1811  AST 26  ALT 19  ALKPHOS 77  BILITOT 0.9  PROT 6.7  ALBUMIN 3.0*   Recent Labs  Lab 04/25/20 1811  LIPASE 29   No results for input(s): AMMONIA in the last 168 hours. Coagulation Profile: No results for input(s): INR, PROTIME in the last 168 hours. Cardiac Enzymes: No results for input(s): CKTOTAL, CKMB, CKMBINDEX, TROPONINI in the last 168 hours. BNP (last 3 results) No results for input(s): PROBNP in the last 8760 hours. HbA1C: No results for input(s): HGBA1C in the last 72 hours. CBG: No results for input(s): GLUCAP in the last 168 hours. Lipid Profile: No results for input(s): CHOL, HDL, LDLCALC, TRIG, CHOLHDL, LDLDIRECT in the last 72 hours. Thyroid Function Tests: No results for input(s): TSH, T4TOTAL, FREET4, T3FREE, THYROIDAB in the last 72 hours. Anemia Panel: No results for input(s): VITAMINB12, FOLATE, FERRITIN, TIBC, IRON, RETICCTPCT in the last 72 hours. Urine analysis: No results found for: COLORURINE, APPEARANCEUR,  LABSPEC, PHURINE, GLUCOSEU, HGBUR, BILIRUBINUR, KETONESUR, PROTEINUR, UROBILINOGEN, NITRITE, LEUKOCYTESUR Sepsis Labs: @LABRCNTIP (procalcitonin:4,lacticidven:4) )No results found for this or any previous visit (from the past 240 hour(s)).   Radiological Exams on Admission: No results found.  EKG: Independently reviewed.  Normal sinus rhythm.  Assessment/Plan Principal Problem:   Acute GI bleeding Active Problems:   Type 2 diabetes mellitus without complication, without long-term current use of insulin (HCC)   Esophageal varices in cirrhosis ([place])   ARF (acute renal failure) (HCC)   Acute blood loss anemia    1. Acute GI bleeding with history of cirrhosis of liver and esophageal varices recently underwent prophylactic esophageal variceal banding with EGD showing large varices and esophageal ulcerations -appreciate GI consult.  Patient is on Protonix and octreotide.  Check serial CBC.  Transfuse if hemoglobin drops.  Patient n.p.o. on ceftriaxone for SBP coverage.  Hold aspirin. 2. COVID-19 positive.  But patient is asymptomatic.  Check inflammatory markers follow respiratory status chest x-ray pending. 3. Pancytopenia with history of MDS follow CBC closely. 4. Diabetes mellitus type 2 with hyperglycemia takes Lantus.  Will check serial CBGs. 5. Acute on chronic and disease stage III likely from hypotension and dehydration from blood draws.  Follow metabolic panel. 6. Acute blood loss anemia follow CBC.   DVT prophylaxis: SCDs.  Avoiding anticoagulation due to GI bleed. Code Status: Full code. Family Communication: Discussed with patient. Disposition Plan: Home. Consults called: Gastroenterologist. Admission status: Observation.   Rise Patience MD [facility] Pager [phone].  If 7PM-7AM, please contact night-coverage [email or web address] Password [email or web address]  04/26/2020, 1:36 AM

## 2020-04-26 NOTE — ED Notes (Signed)
Patient pulled put IV (R) hand  Bandaged hand  RN notified Placed patient in clean gown Emptied urinal

## 2020-04-27 DIAGNOSIS — K922 Gastrointestinal hemorrhage, unspecified: Secondary | ICD-10-CM | POA: Diagnosis not present

## 2020-04-27 DIAGNOSIS — D62 Acute posthemorrhagic anemia: Secondary | ICD-10-CM | POA: Diagnosis not present

## 2020-04-27 DIAGNOSIS — K7460 Unspecified cirrhosis of liver: Secondary | ICD-10-CM | POA: Diagnosis not present

## 2020-04-27 DIAGNOSIS — N179 Acute kidney failure, unspecified: Secondary | ICD-10-CM | POA: Diagnosis not present

## 2020-04-27 LAB — COMPREHENSIVE METABOLIC PANEL
ALT: 15 U/L (ref 0–44)
AST: 20 U/L (ref 15–41)
Albumin: 2.8 g/dL — ABNORMAL LOW (ref 3.5–5.0)
Alkaline Phosphatase: 52 U/L (ref 38–126)
Anion gap: 6 (ref 5–15)
BUN: 26 mg/dL — ABNORMAL HIGH (ref 8–23)
CO2: 28 mmol/L (ref 22–32)
Calcium: 8.4 mg/dL — ABNORMAL LOW (ref 8.9–10.3)
Chloride: 102 mmol/L (ref 98–111)
Creatinine, Ser: 1.85 mg/dL — ABNORMAL HIGH (ref 0.61–1.24)
GFR, Estimated: 38 mL/min — ABNORMAL LOW (ref >60)
Glucose, Bld: 159 mg/dL — ABNORMAL HIGH (ref 70–99)
Potassium: 4.4 mmol/L (ref 3.5–5.1)
Sodium: 136 mmol/L (ref 135–145)
Total Bilirubin: 0.5 mg/dL (ref 0.3–1.2)
Total Protein: 6.2 g/dL — ABNORMAL LOW (ref 6.5–8.1)

## 2020-04-27 LAB — HEMOGLOBIN AND HEMATOCRIT, BLOOD
HCT: 23.8 % — ABNORMAL LOW (ref 39.0–52.0)
Hemoglobin: 7.8 g/dL — ABNORMAL LOW (ref 13.0–17.0)

## 2020-04-27 LAB — ABO/RH: ABO/RH(D): O POS

## 2020-04-27 LAB — GLUCOSE, CAPILLARY
Glucose-Capillary: 136 mg/dL — ABNORMAL HIGH (ref 70–99)
Glucose-Capillary: 179 mg/dL — ABNORMAL HIGH (ref 70–99)

## 2020-04-27 LAB — D-DIMER, QUANTITATIVE: D-Dimer, Quant: 1.77 ug/mL-FEU — ABNORMAL HIGH (ref 0.00–0.50)

## 2020-04-27 LAB — C-REACTIVE PROTEIN: CRP: 1.6 mg/dL — ABNORMAL HIGH (ref <1.0)

## 2020-04-27 MED ORDER — ASPIRIN 81 MG PO CHEW: 81.0000 mg | CHEWABLE_TABLET | Freq: Every day | ORAL | Status: DC

## 2020-04-27 MED ORDER — CIPROFLOXACIN HCL 500 MG PO TABS: 500.0000 mg | ORAL_TABLET | Freq: Two times a day (BID) | ORAL | 0 refills | Status: DC

## 2020-04-27 NOTE — Discharge Summary (Signed)
Physician Discharge Summary  Corey Wood [id number]:[id number] DOB: 02/21/1951 DOA: 04/25/2020  PCP: [name], MD  Admit date: 04/25/2020 Discharge date: 04/27/2020  Admitted From: Home  Discharge disposition: Home  Recommendations for Outpatient Follow-Up:   . Follow up with your primary care provider in one week.  . Check CBC, BMP, magnesium in the next visit . Follow-up with Dr. Enis Gash, GI in 1 to 2 weeks  Discharge Diagnosis:   Principal Problem:   Acute GI bleeding Active Problems:   Type 2 diabetes mellitus without complication, without long-term current use of insulin (HCC)   Esophageal varices in cirrhosis ([place])   ARF (acute renal failure) (HCC)   Acute blood loss anemia   GI bleed   Discharge Condition: Improved.  Diet recommendation: Low sodium, heart healthy.  Carbohydrate-modified.    Wound care: None.  Code status: Full.   History of Present Illness:   Corey Wood a 70 y.o.malewithhistory of cirrhosis of the liver, diabetes mellitus type 2, chronic kidney disease stage III, MDS with pancytopenia presented to the ER with multiple episodes of hematemesis without abdominal pain.   Patient had prophylactic EGD last week when varices were seen and was done banding. In the ER, patient initially was hypotensive improved with fluids. Hemoglobin at around November was around 10.1 dropped down to 8.6.  Was then transfused with PRBC and GI was consulted.  Patient underwent EGD which showed varices and ulcerations in the esophagus.    And was started on octreotide, Protonix infusion and was admitted to hospital for evaluation.  Patient tested positive for Covid but was asymptomatic.   Hospital Course:   Following conditions were addressed during hospitalization as listed below,  Acute blood loss anemia secondary to acute GI bleeding with history of cirrhosis of liver and esophageal varices.  History of recent EGD and variceal banding.  EGD repeat  showed large varices and ulcerations.  No further procedure was done.  During hospitalization patient received octreotide, PPI, IV Rocephin for SBP coverage.  GI saw the patient again today and recommended outpatient follow-up with ciprofloxacin as SBP prophylaxis.  GI has cleared the patient for discharge today.   Hemoglobin prior to discharge was 7.8  CBC Latest Ref Rng & Units 04/27/2020 04/26/2020 04/26/2020  WBC 4.0 - 10.5 K/uL - - -  Hemoglobin 13.0 - 17.0 g/dL 7.8(L) 7.9(L) 8.0(L)  Hematocrit 39.0 - 52.0 % 23.8(L) 24.2(L) 23.8(L)  Platelets 150 - 400 K/uL - - -    Asymptomatic COVID-19 positive.  No need for treatment.  Pancytopenia with history of MDS .    Will need to follow with hematology oncology/ PCP as outpatient.  Diabetes mellitus type 2 with hyperglycemia.    Patient is on Lantus at home.  Acute kidney injury on chronic kidney disease stage IIIa-likely from GI bleed, hypotension.    Improved with hydration.  Disposition.  At this time, patient is stable for disposition home with outpatient PCP follow-up.  Medical Consultants:    None.  Procedures:    None Subjective:   Today, patient was seen and examined at bedside.  Denies any nausea vomiting abdominal pain hematemesis or melena.  Discharge Exam:   Vitals:   04/27/20 0047 04/27/20 0529  BP: (!) 147/75 130/64  Pulse: 65 68  Resp: 15 16  Temp: 99.4 F (37.4 C) 99 F (37.2 C)  SpO2: 91% 93%   Vitals:   04/26/20 1915 04/26/20 2101 04/27/20 0047 04/27/20 0529  BP:  (!) 153/82 [name] Kitchen)  147/75 130/64  Pulse:  75 65 68  Resp:  15 15 16   Temp:  98.4 F (36.9 C) 99.4 F (37.4 C) 99 F (37.2 C)  TempSrc:  Oral Oral Oral  SpO2: 94% 93% 91% 93%  Weight:      Height:        General: Alert awake, not in obvious distress HENT: pupils equally reacting to light, pallor noted.  Oral mucosa is moist.  Chest:  Clear breath sounds.  Diminished breath sounds bilaterally. No crackles or wheezes.  CVS: S1 &S2  heard. No murmur.  Regular rate and rhythm. Abdomen: Soft, nontender, nondistended.  Bowel sounds are heard.   Extremities: No cyanosis, clubbing or edema.  Peripheral pulses are palpable. Psych: Alert, awake and oriented, normal mood CNS:  No cranial nerve deficits.  Power equal in all extremities.   Skin: Warm and dry.  No rashes noted.  The results of significant diagnostics from this hospitalization (including imaging, microbiology, ancillary and laboratory) are listed below for reference.     Diagnostic Studies:   DG CHEST PORT 1 VIEW  Result Date: 04/26/2020 CLINICAL DATA:  Hemoptysis EXAM: PORTABLE CHEST 1 VIEW COMPARISON:  11/06/2019 FINDINGS: Small left pleural effusion. Generous heart size accentuated by technique. No air bronchogram or pulmonary edema. No pneumothorax. Extensive artifact from EKG leads. IMPRESSION: Small left pleural effusion. Electronically Signed   By: Monte Fantasia M.D.   On: 04/26/2020 07:50     Labs:   Basic Metabolic Panel: Recent Labs  Lab 04/25/20 1811 04/27/20 0512  NA 134* 136  K 5.5* 4.4  CL 100 102  CO2 26 28  GLUCOSE 374* 159*  BUN 17 26*  CREATININE 2.12* 1.85*  CALCIUM 8.6* 8.4*   GFR Estimated Creatinine Clearance: 34.2 mL/min (A) (by C-G formula based on SCr of 1.85 mg/dL (H)). Liver Function Tests: Recent Labs  Lab 04/25/20 1811 04/27/20 0512  AST 26 20  ALT 19 15  ALKPHOS 77 52  BILITOT 0.9 0.5  PROT 6.7 6.2*  ALBUMIN 3.0* 2.8*   Recent Labs  Lab 04/25/20 1811  LIPASE 29   No results for input(s): AMMONIA in the last 168 hours. Coagulation profile No results for input(s): INR, PROTIME in the last 168 hours.  CBC: Recent Labs  Lab 04/25/20 1811 04/26/20 0136 04/26/20 0536 04/26/20 0900 04/26/20 1158 04/26/20 1932 04/27/20 0512  WBC 3.8* 2.1* 2.4* 2.5*  --   --   --   NEUTROABS 3.0  --   --   --   --   --   --   HGB 8.6* 6.9* 7.7* 7.8* 8.0* 7.9* 7.8*  HCT 25.9* 20.8* 23.6* 23.5* 23.8* 24.2* 23.8*   MCV 94.2 94.5 94.0 92.5  --   --   --   PLT 72* 63* 63* 63*  --   --   --    Cardiac Enzymes: No results for input(s): CKTOTAL, CKMB, CKMBINDEX, TROPONINI in the last 168 hours. BNP: Invalid input(s): POCBNP CBG: Recent Labs  Lab 04/26/20 1704 04/26/20 2004 04/26/20 2358 04/27/20 0356 04/27/20 0842  GLUCAP 185* 195* 202* 136* 179*   D-Dimer Recent Labs    04/27/20 0512  DDIMER 1.77*   Hgb A1c No results for input(s): HGBA1C in the last 72 hours. Lipid Profile No results for input(s): CHOL, HDL, LDLCALC, TRIG, CHOLHDL, LDLDIRECT in the last 72 hours. Thyroid function studies No results for input(s): TSH, T4TOTAL, T3FREE, THYROIDAB in the last 72 hours.  Invalid input(s): FREET3 Anemia  work up No results for input(s): VITAMINB12, FOLATE, FERRITIN, TIBC, IRON, RETICCTPCT in the last 72 hours. Microbiology No results found for this or any previous visit (from the past 240 hour(s)).   Discharge Instructions:   Discharge Instructions     Diet - low sodium heart healthy   Complete by: As directed    Discharge instructions   Complete by: As directed    Continue to take medications as prescribed.  Start taking aspirin from April 30, 2020.  Seek medical attention for worsening symptoms including bleeding.  Do not take over-the-counter pain medication including Aleve, Motrin.  No alcohol usage.  Follow-up with your primary care physician in 1 week.  Follow-up with GI clinic with Dr Duanne Guess in 1-2 weeks.   Increase activity slowly   Complete by: As directed       Allergies as of 04/27/2020       Reactions   Lisinopril Cough        Medication List     TAKE these medications    albuterol 108 (90 Base) MCG/ACT inhaler Commonly known as: VENTOLIN HFA Inhale 2 puffs into the lungs daily as needed for wheezing or shortness of breath.   ARTIFICIAL SALIVA MT Use as directed 4 sprays in the mouth or throat at bedtime.   aspirin 81 MG chewable tablet Chew 1  tablet (81 mg total) by mouth daily. Start taking on: May 02, 2020 What changed: These instructions start on May 02, 2020. If you are unsure what to do until then, ask your doctor or other care provider.   atorvastatin 20 MG tablet Commonly known as: LIPITOR Take 20 mg by mouth at bedtime.   buPROPion 150 MG 24 hr tablet Commonly known as: WELLBUTRIN XL Take 150 mg by mouth daily.   cetirizine 10 MG chewable tablet Commonly known as: ZYRTEC Chew 10 mg by mouth at bedtime.   ciprofloxacin 500 MG tablet Commonly known as: CIPRO Take 1 tablet (500 mg total) by mouth 2 (two) times daily for 5 days.   diclofenac Sodium 1 % Gel Commonly known as: VOLTAREN Apply 2 g topically 2 (two) times daily as needed (pain).   ferrous sulfate 325 (65 FE) MG tablet Take 325 mg by mouth every Monday, Wednesday, and Friday.   fluticasone 50 MCG/ACT nasal spray Commonly known as: FLONASE Place 2 sprays into both nostrils daily.   glucose 4 GM chewable tablet Chew 1 tablet by mouth daily as needed for low blood sugar (If drop below 70).   HM LIDOCAINE PATCH EX Apply 1 patch topically every 12 (twelve) hours as needed (Pain). 5%   insulin glargine 100 unit/mL Sopn Commonly known as: LANTUS Inject 12 Units into the skin daily.   ipratropium 0.03 % nasal spray Commonly known as: ATROVENT Place 2 sprays into both nostrils at bedtime.   ketotifen 0.025 % ophthalmic solution Commonly known as: ZADITOR Place 1 drop into both eyes at bedtime.   losartan 100 MG tablet Commonly known as: COZAAR Take 100 mg by mouth at bedtime.   meclizine 25 MG tablet Commonly known as: ANTIVERT Take 25 mg by mouth 2 (two) times daily.   montelukast 10 MG tablet Commonly known as: SINGULAIR Take 10 mg by mouth daily.   pantoprazole 40 MG tablet Commonly known as: PROTONIX Take 40 mg by mouth daily.   potassium citrate 10 MEQ (1080 MG) SR tablet Commonly known as: UROCIT-K Take 10 mEq by mouth  2 (two) times daily.   tamsulosin  0.4 MG Caps capsule Commonly known as: FLOMAX Take 0.4 mg by mouth 2 (two) times daily.   traZODone 150 MG tablet Commonly known as: DESYREL Take 75 mg by mouth at bedtime.   vitamin B-12 500 MCG tablet Commonly known as: CYANOCOBALAMIN Take 500 mcg by mouth every other day.        Follow-up Information     Mulles, Corazon, MD. Schedule an appointment as soon as possible for a visit in 1 week(s).   Specialty: Internal Medicine Why: Blood work and regular checkup Contact information: [place] [address] [phone]         Yetta Flock, MD. Schedule an appointment as soon as possible for a visit in 1 week(s).   Specialty: Gastroenterology Why: GI followup Contact information: [address] [place] [address] [phone]                  Time coordinating discharge: 39 minutes  Signed:  Varnell Donate  [facility] 04/27/2020, 9:33 AM

## 2020-04-27 NOTE — Progress Notes (Signed)
Subjective: No complaints.  No further bleeding.  Objective: Vital signs in last 24 hours: Temp:  [98.4 F (36.9 C)-99.4 F (37.4 C)] 99 F (37.2 C) (01/30 0529) Pulse Rate:  [56-75] 68 (01/30 0529) Resp:  [13-26] 16 (01/30 0529) BP: (109-156)/(63-96) 130/64 (01/30 0529) SpO2:  [87 %-98 %] 93 % (01/30 0529) Last BM Date: 04/26/20  Intake/Output from previous day: 01/29 0701 - 01/30 0700 In: 1562.2 [P.O.:360; I.V.:687.2; Blood:315; IV Piggyback:200] Out: 1130 [Urine:1130] Intake/Output this shift: No intake/output data recorded.  General appearance: alert and no distress GI: soft, non-tender; bowel sounds normal; no masses,  no organomegaly  Lab Results: Recent Labs    04/26/20 0136 04/26/20 0536 04/26/20 0900 04/26/20 1158 04/26/20 1932 04/27/20 0512  WBC 2.1* 2.4* 2.5*  --   --   --   HGB 6.9* 7.7* 7.8* 8.0* 7.9* 7.8*  HCT 20.8* 23.6* 23.5* 23.8* 24.2* 23.8*  PLT 63* 63* 63*  --   --   --    BMET Recent Labs    04/25/20 1811 04/27/20 0512  NA 134* 136  K 5.5* 4.4  CL 100 102  CO2 26 28  GLUCOSE 374* 159*  BUN 17 26*  CREATININE 2.12* 1.85*  CALCIUM 8.6* 8.4*   LFT Recent Labs    04/27/20 0512  PROT 6.2*  ALBUMIN 2.8*  AST 20  ALT 15  ALKPHOS 52  BILITOT 0.5   PT/INR No results for input(s): LABPROT, INR in the last 72 hours. Hepatitis Panel No results for input(s): HEPBSAG, HCVAB, HEPAIGM, HEPBIGM in the last 72 hours. C-Diff No results for input(s): CDIFFTOX in the last 72 hours. Fecal Lactopherrin No results for input(s): FECLLACTOFRN in the last 72 hours.  Studies/Results: DG CHEST PORT 1 VIEW  Result Date: 04/26/2020 CLINICAL DATA:  Hemoptysis EXAM: PORTABLE CHEST 1 VIEW COMPARISON:  11/06/2019 FINDINGS: Small left pleural effusion. Generous heart size accentuated by technique. No air bronchogram or pulmonary edema. No pneumothorax. Extensive artifact from EKG leads. IMPRESSION: Small left pleural effusion. Electronically Signed   By:  Corey Wood M.D.   On: 04/26/2020 07:50    Medications:  Scheduled: . insulin aspart  0-9 Units Subcutaneous Q4H  . [START ON 04/29/2020] pantoprazole  40 mg Intravenous Q12H   Continuous: . sodium chloride 20 mL/hr at 04/26/20 2159  . sodium chloride    . cefTRIAXone (ROCEPHIN)  IV    . octreotide  (SANDOSTATIN)    IV infusion 50 mcg/hr (04/27/20 0439)  . pantoprozole (PROTONIX) infusion 8 mg/hr (04/27/20 0454)    Assessment/Plan: 1) Presumed bleeding from a banding ulcer. 2) Anemia - overall stable. 3) NASH cirrhosis.   There are no further episodes of hematemesis and he denies any bowel movements.  The patient is stable.  Plan: 1) Okay to D/C home. 2) Follow up with Dr. Havery Wood in 1-2 weeks. 3) D/C with Cipro 500 mg BID x 5 days.  LOS: 2 days   Corey Wood D 04/27/2020, 9:14 AM

## 2020-04-28 ENCOUNTER — Telehealth: Payer: Self-pay | Admitting: Gastroenterology

## 2020-04-28 NOTE — Telephone Encounter (Signed)
The patient was admitted to the hospital for GI bleeding, I spoke about his case with Dr. Benson Norway who is on-call that night.  Patient had endoscopy and thought to have bleeding from post banding ulcer in the esophagus.  No endoscopic intervention performed and he did well and was discharged over the weekend.  [place] can you please call this patient tomorrow to check up on him and see how he is doing.  I would like to see him back in the office in a few weeks if we have any openings with me.  If no openings let me know and I will try to fit him in somewhere.  Thanks

## 2020-04-28 NOTE — Telephone Encounter (Signed)
Spoke with patient's wife, patient has been scheduled for a hospital follow up on Tuesday, 05/13/2020 at 4 PM. Wife verbalized understanding and had no concerns at the end of the call.

## 2020-04-28 NOTE — Telephone Encounter (Signed)
Inbound call from patient's wife stating he was seen in the ED at [facility] where he had an EGD.  Was informed to follow-up with GI doctor in 1-2 weeks, but Dr. Doyne Keel next availability is in March.  Please advise.

## 2020-04-29 ENCOUNTER — Encounter (HOSPITAL_COMMUNITY)
Admission: EM | Disposition: A | Discharge status: Home Health | Payer: Self-pay | Source: Home / Self Care | Attending: Internal Medicine

## 2020-04-29 ENCOUNTER — Emergency Department (HOSPITAL_COMMUNITY): Payer: No Typology Code available for payment source

## 2020-04-29 ENCOUNTER — Inpatient Hospital Stay (HOSPITAL_COMMUNITY): Payer: No Typology Code available for payment source | Admitting: Anesthesiology

## 2020-04-29 ENCOUNTER — Encounter (HOSPITAL_COMMUNITY): Payer: Self-pay | Admitting: Certified Registered"

## 2020-04-29 ENCOUNTER — Telehealth: Payer: Self-pay | Admitting: Gastroenterology

## 2020-04-29 ENCOUNTER — Inpatient Hospital Stay (HOSPITAL_COMMUNITY): Payer: No Typology Code available for payment source

## 2020-04-29 ENCOUNTER — Encounter (HOSPITAL_COMMUNITY): Payer: Self-pay | Admitting: Internal Medicine

## 2020-04-29 ENCOUNTER — Inpatient Hospital Stay (HOSPITAL_COMMUNITY)
Admission: EM | Admit: 2020-04-29 | Discharge: 2020-05-05 | DRG: 981 | Disposition: A | Discharge status: Home Health | Payer: No Typology Code available for payment source | Attending: Internal Medicine | Admitting: Internal Medicine

## 2020-04-29 DIAGNOSIS — J9601 Acute respiratory failure with hypoxia: Secondary | ICD-10-CM | POA: Diagnosis not present

## 2020-04-29 DIAGNOSIS — I4891 Unspecified atrial fibrillation: Secondary | ICD-10-CM | POA: Diagnosis not present

## 2020-04-29 DIAGNOSIS — M1990 Unspecified osteoarthritis, unspecified site: Secondary | ICD-10-CM | POA: Diagnosis present

## 2020-04-29 DIAGNOSIS — G4733 Obstructive sleep apnea (adult) (pediatric): Secondary | ICD-10-CM | POA: Diagnosis present

## 2020-04-29 DIAGNOSIS — N1831 Chronic kidney disease, stage 3a: Secondary | ICD-10-CM | POA: Diagnosis present

## 2020-04-29 DIAGNOSIS — K921 Melena: Secondary | ICD-10-CM | POA: Diagnosis not present

## 2020-04-29 DIAGNOSIS — U071 COVID-19: Secondary | ICD-10-CM | POA: Diagnosis present

## 2020-04-29 DIAGNOSIS — I8511 Secondary esophageal varices with bleeding: Secondary | ICD-10-CM | POA: Diagnosis present

## 2020-04-29 DIAGNOSIS — E1165 Type 2 diabetes mellitus with hyperglycemia: Secondary | ICD-10-CM | POA: Diagnosis not present

## 2020-04-29 DIAGNOSIS — K3189 Other diseases of stomach and duodenum: Secondary | ICD-10-CM | POA: Diagnosis present

## 2020-04-29 DIAGNOSIS — K91840 Postprocedural hemorrhage and hematoma of a digestive system organ or structure following a digestive system procedure: Secondary | ICD-10-CM | POA: Diagnosis present

## 2020-04-29 DIAGNOSIS — J90 Pleural effusion, not elsewhere classified: Secondary | ICD-10-CM | POA: Diagnosis present

## 2020-04-29 DIAGNOSIS — T82524A Displacement of infusion catheter, initial encounter: Secondary | ICD-10-CM | POA: Diagnosis present

## 2020-04-29 DIAGNOSIS — Z800 Family history of malignant neoplasm of digestive organs: Secondary | ICD-10-CM

## 2020-04-29 DIAGNOSIS — K7460 Unspecified cirrhosis of liver: Secondary | ICD-10-CM

## 2020-04-29 DIAGNOSIS — J449 Chronic obstructive pulmonary disease, unspecified: Secondary | ICD-10-CM | POA: Diagnosis present

## 2020-04-29 DIAGNOSIS — D61818 Other pancytopenia: Secondary | ICD-10-CM | POA: Diagnosis present

## 2020-04-29 DIAGNOSIS — K2211 Ulcer of esophagus with bleeding: Principal | ICD-10-CM

## 2020-04-29 DIAGNOSIS — E1122 Type 2 diabetes mellitus with diabetic chronic kidney disease: Secondary | ICD-10-CM | POA: Diagnosis present

## 2020-04-29 DIAGNOSIS — Z8719 Personal history of other diseases of the digestive system: Secondary | ICD-10-CM

## 2020-04-29 DIAGNOSIS — E669 Obesity, unspecified: Secondary | ICD-10-CM | POA: Diagnosis present

## 2020-04-29 DIAGNOSIS — R404 Transient alteration of awareness: Secondary | ICD-10-CM | POA: Diagnosis not present

## 2020-04-29 DIAGNOSIS — F32A Depression, unspecified: Secondary | ICD-10-CM | POA: Diagnosis present

## 2020-04-29 DIAGNOSIS — D731 Hypersplenism: Secondary | ICD-10-CM | POA: Diagnosis present

## 2020-04-29 DIAGNOSIS — N179 Acute kidney failure, unspecified: Secondary | ICD-10-CM | POA: Diagnosis present

## 2020-04-29 DIAGNOSIS — K766 Portal hypertension: Secondary | ICD-10-CM | POA: Diagnosis present

## 2020-04-29 DIAGNOSIS — E872 Acidosis: Secondary | ICD-10-CM | POA: Diagnosis present

## 2020-04-29 DIAGNOSIS — K920 Hematemesis: Secondary | ICD-10-CM | POA: Diagnosis not present

## 2020-04-29 DIAGNOSIS — E039 Hypothyroidism, unspecified: Secondary | ICD-10-CM | POA: Diagnosis present

## 2020-04-29 DIAGNOSIS — I864 Gastric varices: Secondary | ICD-10-CM | POA: Diagnosis not present

## 2020-04-29 DIAGNOSIS — Y712 Prosthetic and other implants, materials and accessory cardiovascular devices associated with adverse incidents: Secondary | ICD-10-CM | POA: Diagnosis present

## 2020-04-29 DIAGNOSIS — R195 Other fecal abnormalities: Secondary | ICD-10-CM | POA: Diagnosis not present

## 2020-04-29 DIAGNOSIS — R571 Hypovolemic shock: Secondary | ICD-10-CM | POA: Diagnosis present

## 2020-04-29 DIAGNOSIS — Z888 Allergy status to other drugs, medicaments and biological substances status: Secondary | ICD-10-CM

## 2020-04-29 DIAGNOSIS — E1136 Type 2 diabetes mellitus with diabetic cataract: Secondary | ICD-10-CM | POA: Diagnosis present

## 2020-04-29 DIAGNOSIS — I9789 Other postprocedural complications and disorders of the circulatory system, not elsewhere classified: Secondary | ICD-10-CM | POA: Diagnosis not present

## 2020-04-29 DIAGNOSIS — N400 Enlarged prostate without lower urinary tract symptoms: Secondary | ICD-10-CM | POA: Diagnosis present

## 2020-04-29 DIAGNOSIS — D469 Myelodysplastic syndrome, unspecified: Secondary | ICD-10-CM | POA: Diagnosis present

## 2020-04-29 DIAGNOSIS — I8500 Esophageal varices without bleeding: Secondary | ICD-10-CM

## 2020-04-29 DIAGNOSIS — Z79899 Other long term (current) drug therapy: Secondary | ICD-10-CM

## 2020-04-29 DIAGNOSIS — H5350 Unspecified color vision deficiencies: Secondary | ICD-10-CM | POA: Diagnosis present

## 2020-04-29 DIAGNOSIS — G9341 Metabolic encephalopathy: Secondary | ICD-10-CM | POA: Diagnosis present

## 2020-04-29 DIAGNOSIS — K7469 Other cirrhosis of liver: Secondary | ICD-10-CM

## 2020-04-29 DIAGNOSIS — Z7982 Long term (current) use of aspirin: Secondary | ICD-10-CM

## 2020-04-29 DIAGNOSIS — W19XXXA Unspecified fall, initial encounter: Secondary | ICD-10-CM

## 2020-04-29 DIAGNOSIS — K922 Gastrointestinal hemorrhage, unspecified: Secondary | ICD-10-CM

## 2020-04-29 DIAGNOSIS — G20 Parkinson's disease: Secondary | ICD-10-CM | POA: Diagnosis present

## 2020-04-29 DIAGNOSIS — R7989 Other specified abnormal findings of blood chemistry: Secondary | ICD-10-CM | POA: Diagnosis present

## 2020-04-29 DIAGNOSIS — I129 Hypertensive chronic kidney disease with stage 1 through stage 4 chronic kidney disease, or unspecified chronic kidney disease: Secondary | ICD-10-CM | POA: Diagnosis present

## 2020-04-29 DIAGNOSIS — K7581 Nonalcoholic steatohepatitis (NASH): Secondary | ICD-10-CM | POA: Diagnosis present

## 2020-04-29 DIAGNOSIS — Y848 Other medical procedures as the cause of abnormal reaction of the patient, or of later complication, without mention of misadventure at the time of the procedure: Secondary | ICD-10-CM | POA: Diagnosis present

## 2020-04-29 DIAGNOSIS — Z452 Encounter for adjustment and management of vascular access device: Secondary | ICD-10-CM

## 2020-04-29 DIAGNOSIS — Z794 Long term (current) use of insulin: Secondary | ICD-10-CM

## 2020-04-29 DIAGNOSIS — M2040 Other hammer toe(s) (acquired), unspecified foot: Secondary | ICD-10-CM | POA: Diagnosis present

## 2020-04-29 DIAGNOSIS — R578 Other shock: Secondary | ICD-10-CM | POA: Diagnosis present

## 2020-04-29 DIAGNOSIS — Z6830 Body mass index (BMI) 30.0-30.9, adult: Secondary | ICD-10-CM

## 2020-04-29 DIAGNOSIS — K7290 Hepatic failure, unspecified without coma: Secondary | ICD-10-CM | POA: Diagnosis not present

## 2020-04-29 DIAGNOSIS — K219 Gastro-esophageal reflux disease without esophagitis: Secondary | ICD-10-CM | POA: Diagnosis present

## 2020-04-29 DIAGNOSIS — Y92009 Unspecified place in unspecified non-institutional (private) residence as the place of occurrence of the external cause: Secondary | ICD-10-CM

## 2020-04-29 DIAGNOSIS — D62 Acute posthemorrhagic anemia: Secondary | ICD-10-CM | POA: Diagnosis present

## 2020-04-29 DIAGNOSIS — I351 Nonrheumatic aortic (valve) insufficiency: Secondary | ICD-10-CM | POA: Diagnosis not present

## 2020-04-29 DIAGNOSIS — D689 Coagulation defect, unspecified: Secondary | ICD-10-CM | POA: Diagnosis present

## 2020-04-29 DIAGNOSIS — R54 Age-related physical debility: Secondary | ICD-10-CM | POA: Diagnosis present

## 2020-04-29 DIAGNOSIS — R55 Syncope and collapse: Secondary | ICD-10-CM | POA: Diagnosis present

## 2020-04-29 DIAGNOSIS — E785 Hyperlipidemia, unspecified: Secondary | ICD-10-CM | POA: Diagnosis present

## 2020-04-29 DIAGNOSIS — S91312A Laceration without foreign body, left foot, initial encounter: Secondary | ICD-10-CM | POA: Diagnosis present

## 2020-04-29 DIAGNOSIS — I8501 Esophageal varices with bleeding: Secondary | ICD-10-CM

## 2020-04-29 DIAGNOSIS — D6959 Other secondary thrombocytopenia: Secondary | ICD-10-CM | POA: Diagnosis present

## 2020-04-29 DIAGNOSIS — F419 Anxiety disorder, unspecified: Secondary | ICD-10-CM | POA: Diagnosis present

## 2020-04-29 DIAGNOSIS — Z95828 Presence of other vascular implants and grafts: Secondary | ICD-10-CM

## 2020-04-29 DIAGNOSIS — D631 Anemia in chronic kidney disease: Secondary | ICD-10-CM | POA: Diagnosis present

## 2020-04-29 DIAGNOSIS — R011 Cardiac murmur, unspecified: Secondary | ICD-10-CM | POA: Diagnosis present

## 2020-04-29 DIAGNOSIS — R933 Abnormal findings on diagnostic imaging of other parts of digestive tract: Secondary | ICD-10-CM | POA: Diagnosis not present

## 2020-04-29 DIAGNOSIS — H2511 Age-related nuclear cataract, right eye: Secondary | ICD-10-CM | POA: Diagnosis present

## 2020-04-29 DIAGNOSIS — R58 Hemorrhage, not elsewhere classified: Secondary | ICD-10-CM | POA: Diagnosis not present

## 2020-04-29 DIAGNOSIS — I361 Nonrheumatic tricuspid (valve) insufficiency: Secondary | ICD-10-CM | POA: Diagnosis not present

## 2020-04-29 HISTORY — PX: ENDARTERECTOMY: SHX5162

## 2020-04-29 HISTORY — PX: ESOPHAGOGASTRODUODENOSCOPY (EGD) WITH PROPOFOL: SHX5813

## 2020-04-29 HISTORY — PX: ESOPHAGEAL BANDING: SHX5518

## 2020-04-29 LAB — I-STAT CHEM 8, ED
BUN: 33 mg/dL — ABNORMAL HIGH (ref 8–23)
Calcium, Ion: 1.03 mmol/L — ABNORMAL LOW (ref 1.15–1.40)
Chloride: 102 mmol/L (ref 98–111)
Creatinine, Ser: 2.1 mg/dL — ABNORMAL HIGH (ref 0.61–1.24)
Glucose, Bld: 479 mg/dL — ABNORMAL HIGH (ref 70–99)
HCT: 15 % — ABNORMAL LOW (ref 39.0–52.0)
Hemoglobin: 5.1 g/dL — CL (ref 13.0–17.0)
Potassium: 3.9 mmol/L (ref 3.5–5.1)
Sodium: 135 mmol/L (ref 135–145)
TCO2: 18 mmol/L — ABNORMAL LOW (ref 22–32)

## 2020-04-29 LAB — LACTIC ACID, PLASMA
Lactic Acid, Venous: 9.7 mmol/L (ref 0.5–1.9)
Lactic Acid, Venous: 2.4 mmol/L (ref 0.5–1.9)

## 2020-04-29 LAB — TYPE AND SCREEN
ABO/RH(D): O POS
Antibody Screen: NEGATIVE
Unit division: 0
Unit division: 0

## 2020-04-29 LAB — BPAM RBC
Blood Product Expiration Date: 202202242359
Blood Product Expiration Date: 202202242359
ISSUE DATE / TIME: 202201290338
Unit Type and Rh: 5100
Unit Type and Rh: 5100

## 2020-04-29 LAB — COMPREHENSIVE METABOLIC PANEL
ALT: 30 U/L (ref 0–44)
AST: 40 U/L (ref 15–41)
Albumin: 1.8 g/dL — ABNORMAL LOW (ref 3.5–5.0)
Alkaline Phosphatase: 64 U/L (ref 38–126)
Anion gap: 12 (ref 5–15)
BUN: 26 mg/dL — ABNORMAL HIGH (ref 8–23)
CO2: 19 mmol/L — ABNORMAL LOW (ref 22–32)
Calcium: 7.1 mg/dL — ABNORMAL LOW (ref 8.9–10.3)
Chloride: 104 mmol/L (ref 98–111)
Creatinine, Ser: 2.38 mg/dL — ABNORMAL HIGH (ref 0.61–1.24)
GFR, Estimated: 28 mL/min — ABNORMAL LOW (ref >60)
Glucose, Bld: 464 mg/dL — ABNORMAL HIGH (ref 70–99)
Potassium: 4.8 mmol/L (ref 3.5–5.1)
Sodium: 135 mmol/L (ref 135–145)
Total Bilirubin: 0.8 mg/dL (ref 0.3–1.2)
Total Protein: 4.2 g/dL — ABNORMAL LOW (ref 6.5–8.1)

## 2020-04-29 LAB — GLUCOSE, CAPILLARY
Glucose-Capillary: 403 mg/dL — ABNORMAL HIGH (ref 70–99)
Glucose-Capillary: 288 mg/dL — ABNORMAL HIGH (ref 70–99)
Glucose-Capillary: 242 mg/dL — ABNORMAL HIGH (ref 70–99)
Glucose-Capillary: 233 mg/dL — ABNORMAL HIGH (ref 70–99)
Glucose-Capillary: 297 mg/dL — ABNORMAL HIGH (ref 70–99)

## 2020-04-29 LAB — PREPARE FRESH FROZEN PLASMA
Unit division: 0
Unit division: 0

## 2020-04-29 LAB — POCT I-STAT 7, (LYTES, BLD GAS, ICA,H+H)
Acid-Base Excess: 2 mmol/L (ref 0.0–2.0)
Bicarbonate: 25.8 mmol/L (ref 20.0–28.0)
Calcium, Ion: 1.04 mmol/L — ABNORMAL LOW (ref 1.15–1.40)
HCT: 25 % — ABNORMAL LOW (ref 39.0–52.0)
Hemoglobin: 8.5 g/dL — ABNORMAL LOW (ref 13.0–17.0)
O2 Saturation: 100 %
Potassium: 5.4 mmol/L — ABNORMAL HIGH (ref 3.5–5.1)
Sodium: 138 mmol/L (ref 135–145)
TCO2: 27 mmol/L (ref 22–32)
pCO2 arterial: 36.7 mmHg (ref 32.0–48.0)
pH, Arterial: 7.454 — ABNORMAL HIGH (ref 7.350–7.450)
pO2, Arterial: 376 mmHg — ABNORMAL HIGH (ref 83.0–108.0)

## 2020-04-29 LAB — D-DIMER, QUANTITATIVE: D-Dimer, Quant: 17.33 ug/mL-FEU — ABNORMAL HIGH (ref 0.00–0.50)

## 2020-04-29 LAB — PREPARE RBC (CROSSMATCH)

## 2020-04-29 LAB — PROTIME-INR
INR: 1.7 — ABNORMAL HIGH (ref 0.8–1.2)
Prothrombin Time: 19.4 seconds — ABNORMAL HIGH (ref 11.4–15.2)

## 2020-04-29 LAB — BPAM FFP
Blood Product Expiration Date: 202202052359
Blood Product Expiration Date: 202202052359
ISSUE DATE / TIME: 202202010723
ISSUE DATE / TIME: 202202010723
Unit Type and Rh: 600
Unit Type and Rh: 6200

## 2020-04-29 LAB — MRSA PCR SCREENING: MRSA by PCR: NEGATIVE

## 2020-04-29 LAB — BLOOD PRODUCT ORDER (VERBAL) VERIFICATION

## 2020-04-29 LAB — HEMOGLOBIN AND HEMATOCRIT, BLOOD
HCT: 25.4 % — ABNORMAL LOW (ref 39.0–52.0)
HCT: 23 % — ABNORMAL LOW (ref 39.0–52.0)
Hemoglobin: 8.4 g/dL — ABNORMAL LOW (ref 13.0–17.0)
Hemoglobin: 7.9 g/dL — ABNORMAL LOW (ref 13.0–17.0)

## 2020-04-29 LAB — HEMOGLOBIN A1C
Hgb A1c MFr Bld: 6.6 % — ABNORMAL HIGH (ref 4.8–5.6)
Mean Plasma Glucose: 142.72 mg/dL

## 2020-04-29 SURGERY — ENDARTERECTOMY, CAROTID
Anesthesia: General | Site: Neck | Laterality: Left

## 2020-04-29 SURGERY — ESOPHAGOGASTRODUODENOSCOPY (EGD) WITH PROPOFOL
Anesthesia: Monitor Anesthesia Care

## 2020-04-29 MED ORDER — INSULIN ASPART 100 UNIT/ML ~~LOC~~ SOLN
0.0000 [IU] | SUBCUTANEOUS | Status: DC
  Administered 2020-04-29 (×2): 3 [IU] via SUBCUTANEOUS
  Administered 2020-04-29 (×2): 5 [IU] via SUBCUTANEOUS
  Administered 2020-04-30: 3 [IU] via SUBCUTANEOUS

## 2020-04-29 MED ORDER — ROCURONIUM BROMIDE 10 MG/ML (PF) SYRINGE
PREFILLED_SYRINGE | INTRAVENOUS | Status: AC
  Filled 2020-04-29: qty 10

## 2020-04-29 MED ORDER — SODIUM CHLORIDE 0.9 % IV SOLN
1.0000 g | INTRAVENOUS | Status: DC
  Administered 2020-04-29: 1 g via INTRAVENOUS
  Filled 2020-04-29: qty 1

## 2020-04-29 MED ORDER — MIDAZOLAM HCL (PF) 5 MG/ML IJ SOLN
INTRAMUSCULAR | Status: AC
  Filled 2020-04-29: qty 3

## 2020-04-29 MED ORDER — SODIUM CHLORIDE 0.9 % IV SOLN
INTRAVENOUS | Status: AC
  Filled 2020-04-29: qty 1.2

## 2020-04-29 MED ORDER — PHENYLEPHRINE HCL-NACL 10-0.9 MG/250ML-% IV SOLN
INTRAVENOUS | Status: DC | PRN
  Administered 2020-04-29: 50 ug/min via INTRAVENOUS

## 2020-04-29 MED ORDER — PANTOPRAZOLE SODIUM 40 MG IV SOLR
40.0000 mg | Freq: Two times a day (BID) | INTRAVENOUS | Status: DC
  Administered 2020-05-02 – 2020-05-05 (×6): 40 mg via INTRAVENOUS
  Filled 2020-04-29 (×6): qty 40

## 2020-04-29 MED ORDER — FENTANYL CITRATE (PF) 100 MCG/2ML IJ SOLN
INTRAMUSCULAR | Status: AC
  Filled 2020-04-29: qty 4

## 2020-04-29 MED ORDER — ALBUTEROL SULFATE HFA 108 (90 BASE) MCG/ACT IN AERS
2.0000 | INHALATION_SPRAY | RESPIRATORY_TRACT | Status: DC | PRN
  Filled 2020-04-29: qty 6.7

## 2020-04-29 MED ORDER — SODIUM CHLORIDE 0.9 % IV SOLN
80.0000 mg | Freq: Once | INTRAVENOUS | Status: AC
  Administered 2020-04-29: 80 mg via INTRAVENOUS
  Filled 2020-04-29: qty 80

## 2020-04-29 MED ORDER — SUCCINYLCHOLINE CHLORIDE 20 MG/ML IJ SOLN
INTRAMUSCULAR | Status: DC | PRN
  Administered 2020-04-29: 120 mg via INTRAVENOUS

## 2020-04-29 MED ORDER — FENTANYL CITRATE (PF) 250 MCG/5ML IJ SOLN
INTRAMUSCULAR | Status: DC | PRN
  Administered 2020-04-29: 100 ug via INTRAVENOUS
  Administered 2020-04-29: 50 ug via INTRAVENOUS

## 2020-04-29 MED ORDER — PROPOFOL 1000 MG/100ML IV EMUL
5.0000 ug/kg/min | INTRAVENOUS | Status: DC
  Administered 2020-04-29: 50 ug/kg/min via INTRAVENOUS
  Administered 2020-04-29: 45 ug/kg/min via INTRAVENOUS
  Administered 2020-04-30 (×3): 50 ug/kg/min via INTRAVENOUS
  Filled 2020-04-29 (×5): qty 100

## 2020-04-29 MED ORDER — ORAL CARE MOUTH RINSE
15.0000 mL | OROMUCOSAL | Status: DC
  Administered 2020-04-29 – 2020-04-30 (×6): 15 mL via OROMUCOSAL

## 2020-04-29 MED ORDER — VITAMIN K1 10 MG/ML IJ SOLN
10.0000 mg | Freq: Once | INTRAVENOUS | Status: AC
  Administered 2020-04-29: 10 mg via INTRAVENOUS
  Filled 2020-04-29: qty 1

## 2020-04-29 MED ORDER — EPHEDRINE SULFATE 50 MG/ML IJ SOLN
INTRAMUSCULAR | Status: DC | PRN
  Administered 2020-04-29: 5 mg via INTRAVENOUS

## 2020-04-29 MED ORDER — CHLORHEXIDINE GLUCONATE CLOTH 2 % EX PADS
6.0000 | MEDICATED_PAD | Freq: Every day | CUTANEOUS | Status: DC
  Administered 2020-04-30 – 2020-05-05 (×6): 6 via TOPICAL

## 2020-04-29 MED ORDER — PHENYLEPHRINE 40 MCG/ML (10ML) SYRINGE FOR IV PUSH (FOR BLOOD PRESSURE SUPPORT)
PREFILLED_SYRINGE | INTRAVENOUS | Status: DC | PRN
  Administered 2020-04-29 (×3): 80 ug via INTRAVENOUS

## 2020-04-29 MED ORDER — OCTREOTIDE LOAD VIA INFUSION
50.0000 ug | Freq: Once | INTRAVENOUS | Status: AC
  Administered 2020-04-29: 50 ug via INTRAVENOUS
  Filled 2020-04-29: qty 25

## 2020-04-29 MED ORDER — CHLORHEXIDINE GLUCONATE 0.12% ORAL RINSE (MEDLINE KIT)
15.0000 mL | Freq: Two times a day (BID) | OROMUCOSAL | Status: DC
  Administered 2020-04-29 – 2020-05-05 (×11): 15 mL via OROMUCOSAL

## 2020-04-29 MED ORDER — SODIUM CHLORIDE 0.9 % IV SOLN
1.0000 g | Freq: Once | INTRAVENOUS | Status: AC
  Administered 2020-04-29: 1 g via INTRAVENOUS
  Filled 2020-04-29: qty 10

## 2020-04-29 MED ORDER — SODIUM CHLORIDE 0.9 % IV SOLN: INTRAVENOUS | Status: DC | PRN

## 2020-04-29 MED ORDER — LIDOCAINE 2% (20 MG/ML) 5 ML SYRINGE
INTRAMUSCULAR | Status: DC | PRN
  Administered 2020-04-29: 60 mg via INTRAVENOUS

## 2020-04-29 MED ORDER — PROPOFOL 500 MG/50ML IV EMUL
INTRAVENOUS | Status: DC | PRN
  Administered 2020-04-29: 25 ug/kg/min via INTRAVENOUS

## 2020-04-29 MED ORDER — ONDANSETRON HCL 4 MG/2ML IJ SOLN
INTRAMUSCULAR | Status: DC | PRN
Start: 2020-04-29 — End: 2020-04-29
  Administered 2020-04-29: 4 mg via INTRAVENOUS

## 2020-04-29 MED ORDER — FENTANYL CITRATE (PF) 250 MCG/5ML IJ SOLN
INTRAMUSCULAR | Status: AC
  Filled 2020-04-29: qty 5

## 2020-04-29 MED ORDER — DIPHENHYDRAMINE HCL 50 MG/ML IJ SOLN
INTRAMUSCULAR | Status: AC
  Filled 2020-04-29: qty 1

## 2020-04-29 MED ORDER — SODIUM CHLORIDE 0.9 % IV SOLN
INTRAVENOUS | Status: DC | PRN
  Administered 2020-04-29: 500 mL

## 2020-04-29 MED ORDER — SUCCINYLCHOLINE CHLORIDE 200 MG/10ML IV SOSY
PREFILLED_SYRINGE | INTRAVENOUS | Status: AC
  Filled 2020-04-29: qty 10

## 2020-04-29 MED ORDER — DEXAMETHASONE SODIUM PHOSPHATE 10 MG/ML IJ SOLN
INTRAMUSCULAR | Status: DC | PRN
  Administered 2020-04-29: 5 mg via INTRAVENOUS

## 2020-04-29 MED ORDER — SODIUM CHLORIDE 0.9 % IV SOLN
8.0000 mg/h | INTRAVENOUS | Status: DC
  Administered 2020-04-29 – 2020-05-02 (×5): 8 mg/h via INTRAVENOUS
  Filled 2020-04-29 (×11): qty 80

## 2020-04-29 MED ORDER — ETOMIDATE 2 MG/ML IV SOLN
INTRAVENOUS | Status: DC | PRN
  Administered 2020-04-29: 10 mg via INTRAVENOUS

## 2020-04-29 MED ORDER — STERILE WATER FOR IRRIGATION IR SOLN
Status: DC | PRN
  Administered 2020-04-29: 1000 mL

## 2020-04-29 MED ORDER — ETOMIDATE 2 MG/ML IV SOLN
INTRAVENOUS | Status: AC
  Filled 2020-04-29: qty 20

## 2020-04-29 MED ORDER — NOREPINEPHRINE 4 MG/250ML-% IV SOLN
0.0000 ug/min | INTRAVENOUS | Status: DC
  Administered 2020-04-29: 5 ug/min via INTRAVENOUS
  Filled 2020-04-29 (×2): qty 250

## 2020-04-29 MED ORDER — SODIUM CHLORIDE 0.9 % IV SOLN
50.0000 ug/h | INTRAVENOUS | Status: DC
  Administered 2020-04-29 – 2020-05-02 (×5): 50 ug/h via INTRAVENOUS
  Filled 2020-04-29 (×9): qty 1

## 2020-04-29 MED ORDER — SODIUM CHLORIDE 0.9 % IV SOLN
10.0000 mL/h | Freq: Once | INTRAVENOUS | Status: AC
  Administered 2020-04-29: 10 mL/h via INTRAVENOUS

## 2020-04-29 MED ORDER — ROCURONIUM BROMIDE 10 MG/ML (PF) SYRINGE
PREFILLED_SYRINGE | INTRAVENOUS | Status: DC | PRN
  Administered 2020-04-29 (×2): 50 mg via INTRAVENOUS

## 2020-04-29 MED ORDER — 0.9 % SODIUM CHLORIDE (POUR BTL) OPTIME
TOPICAL | Status: DC | PRN
  Administered 2020-04-29 (×2): 1000 mL

## 2020-04-29 MED ORDER — METOCLOPRAMIDE HCL 5 MG/ML IJ SOLN
10.0000 mg | Freq: Once | INTRAMUSCULAR | Status: AC
  Administered 2020-04-29: 10 mg via INTRAVENOUS
  Filled 2020-04-29: qty 2

## 2020-04-29 SURGICAL SUPPLY — 32 items
ADH SKN CLS APL DERMABOND .7 (GAUZE/BANDAGES/DRESSINGS) ×1
BNDG GAUZE ELAST 4 BULKY (GAUZE/BANDAGES/DRESSINGS) ×1 IMPLANT
CANISTER SUCT 3000ML PPV (MISCELLANEOUS) ×2 IMPLANT
CANNULA VESSEL 3MM 2 BLNT TIP (CANNULA) ×6 IMPLANT
CATH ROBINSON RED A/P 18FR (CATHETERS) ×2 IMPLANT
CLIP LIGATING EXTRA MED SLVR (CLIP) ×2 IMPLANT
CLIP LIGATING EXTRA SM BLUE (MISCELLANEOUS) ×2 IMPLANT
DERMABOND ADVANCED (GAUZE/BANDAGES/DRESSINGS) ×1
DERMABOND ADVANCED .7 DNX12 (GAUZE/BANDAGES/DRESSINGS) ×1 IMPLANT
ELECT REM PT RETURN 9FT ADLT (ELECTROSURGICAL) ×2
ELECTRODE REM PT RTRN 9FT ADLT (ELECTROSURGICAL) ×1 IMPLANT
GAUZE SPONGE 4X4 12PLY STRL LF (GAUZE/BANDAGES/DRESSINGS) ×1 IMPLANT
GLOVE SS BIOGEL STRL SZ 7.5 (GLOVE) ×1 IMPLANT
GLOVE SUPERSENSE BIOGEL SZ 7.5 (GLOVE) ×1
GOWN STRL REUS W/ TWL LRG LVL3 (GOWN DISPOSABLE) ×3 IMPLANT
GOWN STRL REUS W/TWL LRG LVL3 (GOWN DISPOSABLE) ×6
KIT BASIN OR (CUSTOM PROCEDURE TRAY) ×2 IMPLANT
KIT SHUNT ARGYLE CAROTID ART 6 (VASCULAR PRODUCTS) IMPLANT
KIT TURNOVER KIT B (KITS) ×2 IMPLANT
NS IRRIG 1000ML POUR BTL (IV SOLUTION) ×4 IMPLANT
PACK CAROTID (CUSTOM PROCEDURE TRAY) ×2 IMPLANT
PAD ARMBOARD 7.5X6 YLW CONV (MISCELLANEOUS) ×4 IMPLANT
POSITIONER HEAD DONUT 9IN (MISCELLANEOUS) ×2 IMPLANT
SHUNT CAROTID BYPASS 10 (VASCULAR PRODUCTS) IMPLANT
SHUNT CAROTID BYPASS 12FRX15.5 (VASCULAR PRODUCTS) IMPLANT
SUT PROLENE 5 0 C 1 24 (SUTURE) ×2 IMPLANT
SUT PROLENE 6 0 CC (SUTURE) ×3 IMPLANT
SUT VIC AB 3-0 SH 27 (SUTURE) ×4
SUT VIC AB 3-0 SH 27X BRD (SUTURE) ×2 IMPLANT
SUT VIC AB 4-0 PS2 27 (SUTURE) ×1 IMPLANT
TOWEL GREEN STERILE (TOWEL DISPOSABLE) ×2 IMPLANT
WATER STERILE IRR 1000ML POUR (IV SOLUTION) ×2 IMPLANT

## 2020-04-29 SURGICAL SUPPLY — 15 items

## 2020-04-29 NOTE — H&P (Signed)
NAME:  Corey Wood, MRN:  [id number], DOB:  12/28/1950, LOS: 0 ADMISSION DATE:  04/29/2020, CONSULTATION DATE:  04/29/2020 REFERRING MD:  Dr. Christy Gentles, CHIEF COMPLAINT:  UGIB  Brief History:  70 year old male with hx of NASH cirrhosis with recent esophageal variceal banding on 1/20 presenting from home s/p fall with hematemesis and melena with AMS and in hemorraghic shock with MTP initiated in ER.  GI consulted, PCCM to admit.   History of Present Illness:  HPI obtained mostly from medical chart review as patient remains encephalopathic.    70 year old male with prior history of NASH cirrhosis, known variceal and gastroesophageal varices w/ banding (previously intolerant to BB given symptomatic bradycardia), CKD stage III, MDS with pancytopenia, and recent COVID + presenting from [facility] after fall and bloody emesis and diarrhea with reports of > 1L of bloody emesis with EMS.    Of note, patient underwent recent EGD on 1/20 with Dr. Havery Moros for known large esophageal varices and underwent eleven bands.  Unfortunately he returned and hospitalized 1/28- 1/30 for UGIB presumed bleeding from a banded ulcer; he underwent EGD again on 1/28 by Dr. Benson Norway found to have large (> 5 mm) esophageal varices, esophageal ulcers with no stigmata of recent bleeding, and portal hypertensive gastropathy; no interventions performed.  He was also noted to test positive for COVID 1/28 but was asymptomatic.   On arrival to ER, he was minimally responsive, pale, hypotensive with SBP in the 40-60's, and afebrile.  He was started on mass transfusion protocol s/p 4 units PRBC and receiving 2 units of FFP and platelets now.  Hgb 5.1/ HCT 15, previously 7.8 on 1/30, lactic acid 9.7,  IO placed f/b emergent central line placement.  Patient has had improvement in mental status with improvement in BP and intubation deferred at this time.  Other labs currently pending.  Protonix and octreotide gtt ordered.  Currently remains on  NRB.  [facility] GI consulted and will see.  PCCM to admit.   Past Medical History:  Never smoker, NASH cirrhosis, hx of recent EGD with variceal banding and ulcerations, COPD, OSA, GERD, HTN, HLD, DMT2, CKD stage IIIa, MDS with pancytopenia, gallstone pancreatitis, anxiety/ depression, parkinsonism, murmur  COVID + 04/25/2020  Significant Hospital Events:  1/28- 1/30 UGIB  2/1 admit PCCM   Consults:  GI- [facility]  Procedures:  2/1 L IJ CVL  >>  Significant Diagnostic Tests:  1/20 EGD (Dr. Havery Moros) >> - Esophagogastric landmarks identified. - Large esophageal varices. Banded x 11. - Type 2 gastroesophageal varices (GOV2, esophageal varices which extend along the fundus). - Erythematous mucosa in the antrum. - Normal duodenal bulb and second portion of the duodenum.  1/28 EGD (Dr. Benson Norway) >> - Large (> 5 mm) esophageal varices. - Esophageal ulcers with no stigmata of recent bleeding. - Portal hypertensive gastropathy. - A medium amount of food (residue) in the stomach. - Normal examined duodenum. - No specimens collected.  Micro Data:   Antimicrobials:  2/1 ceftriaxone >>  Interim History / Subjective:   Objective   Blood pressure (!) 49/23, pulse 65, resp. rate (!) 31, SpO2 100 %.       No intake or output data in the 24 hours ending 04/29/20 0729 There were no vitals filed for this visit.  Examination: General:  Pale/ ill appearing elderly male in NAD on ER stretcher HEENT: MM pink/moist, pupils 3/reactive, abrasion over right eyebrow Neuro: Awake, able to answer questions, MAE CV: rr, +1 peripheral  pulses PULM:  Non labored, lungs clear, diminished in left base GI: round, soft, bs+, denies tenderness/ pain Extremities: cool/dry, no edema, L tibial IO, left fourth toe laceration with protruding bone - no bleeding Skin: no rashes  Resolved Hospital Problem list    Assessment & Plan:   Hemorraghic shock from suspected esophageal/ gastroesophageal varices ABLA NASH  cirrhosis Esophageal/ gastroesophageal varices with recent banding 04/17/20 x 11 - MTP started in ER s/p 4 units PRBC, finishing 2 units FFP and 1 platelet - he is currently protecting his airway with no active emesis but is high risk for intubation, can wean NRB for sat goal > 92% - remains NPO - GI consulted, will defer to them if we need to involve IR for possible embolization vs EGD - s/p CVL placement, hold on aline for now - NE as need for permissive goal MAP 55-60 to prevent further exacerbation of bleeding but maintain organ perfusion  - empiric CTX for SBP prophylaxis  - continue PPI gtt and octreotide per GI  - pending coags, full CMET and CBC and trend labs  - trend lactic acid   Fall, suspect from symptomatic hypotension, unclear if +LOC Encephalopathy, improved with hemodynamic support - currently non focal but consider CTH/ cervical when stable - ongoing neuro checks - will need XR of left 4th toe/ ortho consult - check UA for completeness, otherwise no infectious symptoms   AoCKD stage IIIa, likely prerenal from shock  Lactic acidosis  - hemodynamic support - trend renal indices/ strict I/Os - prn bladder scan    DMT2 - SSI   MDS with pancytopenia  - trend CBC - transfuse for Hgb > 7 - further outpt follow up   COVID + - asymptomatic, initial + test 1/28 - continue airborne and contact precautions  Hx COPD/ OSA - albuterol HFA prn  - CXR today with recurrent small left pleural effusion, ? Retrocardiac opacity vs atelectasis    Hx HTN/ HLD - holding home ASA, lipitor, losartan while in shock   Anemia  - hold home iron/ B12 - transfuse as above, Hgb < 7  Anxiety/ depression  - hold home trazodone/ NPO  Best practice (evaluated daily)  Diet: NPO Pain/Anxiety/Delirium protocol (if indicated): n/a VAP protocol (if indicated): n/a DVT prophylaxis: SCDs only  GI prophylaxis: PPI gtt Glucose control: SSI Mobility: Bedrest Disposition: admit  ICU  Goals of Care:  Last date of multidisciplinary goals of care discussion:pending Family and staff present:  Summary of discussion:  Follow up goals of care discussion due:  Code Status: Full   Labs   CBC: Recent Labs  Lab 04/25/20 1811 04/26/20 0136 04/26/20 0536 04/26/20 0900 04/26/20 1158 04/26/20 1932 04/27/20 0512 04/29/20 0629  WBC 3.8* 2.1* 2.4* 2.5*  --   --   --   --   NEUTROABS 3.0  --   --   --   --   --   --   --   HGB 8.6* 6.9* 7.7* 7.8* 8.0* 7.9* 7.8* 5.1*  HCT 25.9* 20.8* 23.6* 23.5* 23.8* 24.2* 23.8* 15.0*  MCV 94.2 94.5 94.0 92.5  --   --   --   --   PLT 72* 63* 63* 63*  --   --   --   --     Basic Metabolic Panel: Recent Labs  Lab 04/25/20 1811 04/27/20 0512 04/29/20 0629  NA 134* 136 135  K 5.5* 4.4 3.9  CL 100 102 102  CO2 26 28  --  GLUCOSE 374* 159* 479*  BUN 17 26* 33*  CREATININE 2.12* 1.85* 2.10*  CALCIUM 8.6* 8.4*  --    GFR: Estimated Creatinine Clearance: 30.2 mL/min (A) (by C-G formula based on SCr of 2.1 mg/dL (H)). Recent Labs  Lab 04/25/20 1811 04/26/20 0136 04/26/20 0536 04/26/20 0900  WBC 3.8* 2.1* 2.4* 2.5*    Liver Function Tests: Recent Labs  Lab 04/25/20 1811 04/27/20 0512  AST 26 20  ALT 19 15  ALKPHOS 77 52  BILITOT 0.9 0.5  PROT 6.7 6.2*  ALBUMIN 3.0* 2.8*   Recent Labs  Lab 04/25/20 1811  LIPASE 29   No results for input(s): AMMONIA in the last 168 hours.  ABG    Component Value Date/Time   TCO2 18 (L) 04/29/2020 0629     Coagulation Profile: No results for input(s): INR, PROTIME in the last 168 hours.  Cardiac Enzymes: No results for input(s): CKTOTAL, CKMB, CKMBINDEX, TROPONINI in the last 168 hours.  HbA1C: No results found for: HGBA1C  CBG: Recent Labs  Lab 04/26/20 1704 04/26/20 2004 04/26/20 2358 04/27/20 0356 04/27/20 0842  GLUCAP 185* 195* 202* 136* 179*    Review of Systems:   unable  Past Medical History:  He,  has a past medical history of Allergy, Anxiety,  Aortic valve disorder, Arthritis, Asthma, Cataract, Cirrhosis ([place]), CKD (chronic kidney disease), stage III ([place]), COPD (chronic obstructive pulmonary disease) ([place]), Depression, DM (diabetes mellitus) ([place]), GERD (gastroesophageal reflux disease), Heart murmur, History of colon polyps, acute pancreatitis (10/2019), Hyperlipidemia, Hypertension, Hypothyroidism, Myelodysplastic syndrome ([place]), Neuromuscular disorder ([place]), Obstructive sleep apnea, Parkinsonism ([place]), and Peripheral positional vertigo.   Surgical History:   Past Surgical History:  Procedure Laterality Date  . ANKLE SURGERY    . CHOLECYSTECTOMY    . ESOPHAGEAL BANDING N/A 04/17/2020   Procedure: ESOPHAGEAL BANDING;  Surgeon: Yetta Flock, MD;  Location: [facility];  Service: Gastroenterology;  Laterality: N/A;  . ESOPHAGOGASTRODUODENOSCOPY (EGD) WITH PROPOFOL N/A 04/17/2020   Procedure: ESOPHAGOGASTRODUODENOSCOPY (EGD) WITH PROPOFOL;  Surgeon: Yetta Flock, MD;  Location: [facility];  Service: Gastroenterology;  Laterality: N/A;  . EYE SURGERY    . HERNIA REPAIR    . KIDNEY STONE SURGERY    . WISDOM TOOTH EXTRACTION       Social History:   reports that he has never smoked. He has never used smokeless tobacco. He reports previous alcohol use. He reports previous drug use.   Family History:  His family history includes Liver cancer in his mother. There is no history of Colon cancer or Stomach cancer.   Allergies Allergies  Allergen Reactions  . Lisinopril Cough     Home Medications  Prior to Admission medications   Medication Sig Start Date End Date Taking? Authorizing Provider  albuterol (VENTOLIN HFA) 108 (90 Base) MCG/ACT inhaler Inhale 2 puffs into the lungs daily as needed for wheezing or shortness of breath.    [provider]  ARTIFICIAL SALIVA MT Use as directed 4 sprays in the mouth or throat at bedtime.    [provider]  aspirin 81 MG chewable tablet Chew 1 tablet (81 mg  total) by mouth daily. 05/02/20   Pokhrel, Corrie Mckusick, MD  atorvastatin (LIPITOR) 20 MG tablet Take 20 mg by mouth at bedtime.    [provider]  buPROPion (WELLBUTRIN XL) 150 MG 24 hr tablet Take 150 mg by mouth daily.    [provider]  cetirizine (ZYRTEC) 10 MG chewable tablet Chew 10 mg by mouth  at bedtime.    [provider]  ciprofloxacin (CIPRO) 500 MG tablet Take 1 tablet (500 mg total) by mouth 2 (two) times daily for 5 days. 04/27/20 05/02/20  Pokhrel, Corrie Mckusick, MD  diclofenac Sodium (VOLTAREN) 1 % GEL Apply 2 g topically 2 (two) times daily as needed (pain).    [provider]  ferrous sulfate 325 (65 FE) MG tablet Take 325 mg by mouth every Monday, Wednesday, and Friday.    [provider]  fluticasone (FLONASE) 50 MCG/ACT nasal spray Place 2 sprays into both nostrils daily.    [provider]  glucose 4 GM chewable tablet Chew 1 tablet by mouth daily as needed for low blood sugar (If drop below 70).    [provider]  HM LIDOCAINE PATCH EX Apply 1 patch topically every 12 (twelve) hours as needed (Pain). 5%    [provider]  insulin glargine (LANTUS) 100 unit/mL SOPN Inject 12 Units into the skin daily.    [provider]  ipratropium (ATROVENT) 0.03 % nasal spray Place 2 sprays into both nostrils at bedtime.    [provider]  ketotifen (ZADITOR) 0.025 % ophthalmic solution Place 1 drop into both eyes at bedtime.    [provider]  losartan (COZAAR) 100 MG tablet Take 100 mg by mouth at bedtime.    [provider]  meclizine (ANTIVERT) 25 MG tablet Take 25 mg by mouth 2 (two) times daily.    [provider]  montelukast (SINGULAIR) 10 MG tablet Take 10 mg by mouth daily.    [provider]  pantoprazole (PROTONIX) 40 MG tablet Take 40 mg by mouth daily.    [provider]  potassium citrate (UROCIT-K) 10 MEQ (1080 MG) SR tablet Take 10 mEq by mouth 2 (two)  times daily.    [provider]  tamsulosin (FLOMAX) 0.4 MG CAPS capsule Take 0.4 mg by mouth 2 (two) times daily.    [provider]  traZODone (DESYREL) 150 MG tablet Take 75 mg by mouth at bedtime.    [provider]  vitamin B-12 (CYANOCOBALAMIN) 500 MCG tablet Take 500 mcg by mouth every other day.    [provider]     Critical care time: 60 mins     Kennieth Rad, ACNP [facility] 04/29/2020, 8:58 AM

## 2020-04-29 NOTE — Anesthesia Preprocedure Evaluation (Addendum)
Anesthesia Evaluation  Patient identified by MRN, date of birth, ID band Patient awake    Reviewed: Allergy & Precautions, NPO status , Patient's Chart, lab work & pertinent test results  Airway Mallampati: II  TM Distance: >3 FB     Dental   Pulmonary asthma , sleep apnea , COPD,  COVID+   breath sounds clear to auscultation       Cardiovascular hypertension, Pt. on medications  Rhythm:Regular Rate:Normal  Left carotid CVL placement here for exploration and removal   Neuro/Psych Anxiety Depression  Neuromuscular disease (Parkinsonism)    GI/Hepatic GERD  Medicated,(+) Cirrhosis   Esophageal Varices    , GIB   Endo/Other  diabetes, Type 2Hypothyroidism   Renal/GU CRFRenal disease  negative genitourinary   Musculoskeletal  (+) Arthritis , Osteoarthritis,    Abdominal   Peds  Hematology  (+) anemia , MDS   Anesthesia Other Findings   Reproductive/Obstetrics                           Anesthesia Physical Anesthesia Plan  ASA: III and emergent  Anesthesia Plan: General   Post-op Pain Management:    Induction: Intravenous and Rapid sequence  PONV Risk Score and Plan: 2 and Ondansetron and Treatment may vary due to age or medical condition  Airway Management Planned: Mask and Oral ETT  Additional Equipment:   Intra-op Plan:   Post-operative Plan: Possible Post-op intubation/ventilation  Informed Consent: I have reviewed the patients History and Physical, chart, labs and discussed the procedure including the risks, benefits and alternatives for the proposed anesthesia with the patient or authorized representative who has indicated his/her understanding and acceptance.     Dental advisory given  Plan Discussed with: CRNA and Anesthesiologist  Anesthesia Plan Comments: (Lab Results      Component                Value               Date                      WBC                       2.5 (L)             04/26/2020                HGB                      8.4 (L)             04/29/2020                HCT                      25.4 (L)            04/29/2020                MCV                      92.5                04/26/2020                PLT  63 (L)              04/26/2020           Lab Results      Component                Value               Date                      NA                       135                 04/29/2020                K                        4.8                 04/29/2020                CO2                      19 (L)              04/29/2020                GLUCOSE                  464 (H)             04/29/2020                BUN                      26 (H)              04/29/2020                CREATININE               2.38 (H)            04/29/2020                CALCIUM                  7.1 (L)             04/29/2020                GFRNONAA                 28 (L)              04/29/2020          )       Anesthesia Quick Evaluation

## 2020-04-29 NOTE — ED Provider Notes (Signed)
[facility] EMERGENCY DEPARTMENT Provider Note   CSN: [id number] Arrival date & time: 04/29/20  0627     History Chief Complaint  Patient presents with  . Hematemesis   Level 5 caveat due to acuity of condition Corey Wood is a 70 y.o. male.  The history is provided by the EMS personnel. The history is limited by the condition of the patient.  Emesis Severity:  Severe Timing:  Constant Progression:  Worsening Chronicity:  New Relieved by:  Nothing Worsened by:  Nothing  Patient with extensive history including cirrhosis, CKD, diabetes presents with GI bleed.  Patient was recent admitted for GI bleed.  After going home he started having increased episodes of hematemesis and bloody diarrhea.  When EMS picked the patient up when he stood up he had a syncopal episode.  He also vomited in route.  No other details are known on arrival.    Past Medical History:  Diagnosis Date  . Allergy    seasonal allergies  . Anxiety    on meds  . Aortic valve disorder   . Arthritis    generalized (fingers)(shoulders)  . Asthma    uses inhaler  . Cataract    bilateral -sx   . Cirrhosis ([place])   . CKD (chronic kidney disease), stage III ([place])    only has one kidney  . COPD (chronic obstructive pulmonary disease) ([place])   . Depression    on meds  . DM (diabetes mellitus) ([place])    on meds  . GERD (gastroesophageal reflux disease)    on meds  . Heart murmur   . History of colon polyps   . Hx of acute pancreatitis 10/2019  . Hyperlipidemia    on meds  . Hypertension    on meds  . Hypothyroidism    not on meds at this time  . Myelodysplastic syndrome ([place])   . Neuromuscular disorder ([place])    per pt  . Obstructive sleep apnea   . Parkinsonism ([place])    per pt report  . Peripheral positional vertigo     Patient Active Problem List   Diagnosis Date Noted  . ARF (acute renal failure) ([place]) 04/26/2020  . Acute blood loss anemia 04/26/2020  . GI bleed  04/26/2020  . Acute GI bleeding 04/25/2020  . Esophageal varices in cirrhosis (H[facility])   . Age-related nuclear cataract of right eye 09/15/2016  . Pseudophakia of left eye 09/15/2016  . Intraoperative floppy iris syndrome (IFIS) 01/13/2016  . 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 Acquired color vision deficiency 11/24/2015  . Nuclear sclerosis of right eye 11/24/2015  . Open angle with borderline findings and high glaucoma risk in both eyes 11/24/2015  . Type 2 diabetes mellitus without complication, without long-term current use of insulin ([place]) 11/24/2015    Past Surgical History:  Procedure Laterality Date  . ANKLE SURGERY    . CHOLECYSTECTOMY    . ESOPHAGEAL BANDING N/A 04/17/2020   Procedure: ESOPHAGEAL BANDING;  Surgeon: Yetta Flock, MD;  Location: [facility];  Service: Gastroenterology;  Laterality: N/A;  . ESOPHAGOGASTRODUODENOSCOPY (EGD) WITH PROPOFOL N/A 04/17/2020   Procedure: ESOPHAGOGASTRODUODENOSCOPY (EGD) WITH PROPOFOL;  Surgeon: Yetta Flock, MD;  Location: [facility];  Service: Gastroenterology;  Laterality: N/A;  . EYE SURGERY    . HERNIA REPAIR    . KIDNEY STONE SURGERY    . WISDOM TOOTH EXTRACTION         Family History  Problem Relation Age of Onset  .  Liver cancer Mother   . Colon cancer Neg Hx   . Stomach cancer Neg Hx     Social History   Tobacco Use  . Smoking status: Never Smoker  . Smokeless tobacco: Never Used  Vaping Use  . Vaping Use: Never used  Substance Use Topics  . Alcohol use: Not Currently  . Drug use: Not Currently    Home Medications Prior to Admission medications   Medication Sig Start Date End Date Taking? Authorizing Provider  albuterol (VENTOLIN HFA) 108 (90 Base) MCG/ACT inhaler Inhale 2 puffs into the lungs daily as needed for wheezing or shortness of breath.    [provider]  ARTIFICIAL SALIVA MT Use as directed 4 sprays in the mouth or throat at bedtime.    [provider]  aspirin 81 MG  chewable tablet Chew 1 tablet (81 mg total) by mouth daily. 05/02/20   Pokhrel, Rebekah Chesterfield, MD  atorvastatin (LIPITOR) 20 MG tablet Take 20 mg by mouth at bedtime.    [provider]  buPROPion (WELLBUTRIN XL) 150 MG 24 hr tablet Take 150 mg by mouth daily.    [provider]  cetirizine (ZYRTEC) 10 MG chewable tablet Chew 10 mg by mouth at bedtime.    [provider]  ciprofloxacin (CIPRO) 500 MG tablet Take 1 tablet (500 mg total) by mouth 2 (two) times daily for 5 days. 04/27/20 05/02/20  Pokhrel, Rebekah Chesterfield, MD  diclofenac Sodium (VOLTAREN) 1 % GEL Apply 2 g topically 2 (two) times daily as needed (pain).    [provider]  ferrous sulfate 325 (65 FE) MG tablet Take 325 mg by mouth every Monday, Wednesday, and Friday.    [provider]  fluticasone (FLONASE) 50 MCG/ACT nasal spray Place 2 sprays into both nostrils daily.    [provider]  glucose 4 GM chewable tablet Chew 1 tablet by mouth daily as needed for low blood sugar (If drop below 70).    [provider]  HM LIDOCAINE PATCH EX Apply 1 patch topically every 12 (twelve) hours as needed (Pain). 5%    [provider]  insulin glargine (LANTUS) 100 unit/mL SOPN Inject 12 Units into the skin daily.    [provider]  ipratropium (ATROVENT) 0.03 % nasal spray Place 2 sprays into both nostrils at bedtime.    [provider]  ketotifen (ZADITOR) 0.025 % ophthalmic solution Place 1 drop into both eyes at bedtime.    [provider]  losartan (COZAAR) 100 MG tablet Take 100 mg by mouth at bedtime.    [provider]  meclizine (ANTIVERT) 25 MG tablet Take 25 mg by mouth 2 (two) times daily.    [provider]  montelukast (SINGULAIR) 10 MG tablet Take 10 mg by mouth daily.    [provider]  pantoprazole (PROTONIX) 40 MG tablet Take 40 mg by mouth daily.    [provider]  potassium citrate (UROCIT-K) 10 MEQ (1080 MG) SR  tablet Take 10 mEq by mouth 2 (two) times daily.    [provider]  tamsulosin (FLOMAX) 0.4 MG CAPS capsule Take 0.4 mg by mouth 2 (two) times daily.    [provider]  traZODone (DESYREL) 150 MG tablet Take 75 mg by mouth at bedtime.    [provider]  vitamin B-12 (CYANOCOBALAMIN) 500 MCG tablet Take 500 mcg by mouth every other day.    [provider]    Allergies    Lisinopril  Review  of Systems   Review of Systems  Unable to perform ROS: Acuity of condition  Gastrointestinal: Positive for vomiting.    Physical Exam Updated Vital Signs BP (!) 63/44   Pulse 60   Resp (!) 25   SpO2 100%   Physical Exam CONSTITUTIONAL: Ill-appearing, pale HEAD: Bruising noted to forehead, no other signs of trauma EYES: EOMI ENMT: Blood in mouth NECK: supple no meningeal signs SPINE/BACK:entire spine nontender CV: S1/S2 noted, no murmurs/rubs/gallops noted LUNGS: Lungs are clear to auscultation bilaterally, no apparent distress ABDOMEN: soft, nondistended GU: -Normal appearance Blood noted in his diaper NEURO: Pt is unresponsive, arousable to voice and pain EXTREMITIES: IO in left tibia, laceration noted to left toe, pelvis stable, no other signs of traumatic injury to extremities SKIN: Cool to touch PSYCH: Unable to assess  ED Results / Procedures / Treatments   Labs (all labs ordered are listed, but only abnormal results are displayed) Labs Reviewed  LACTIC ACID, PLASMA - Abnormal; Notable for the following components:      Result Value   Lactic Acid, Venous 9.7 (*)    All other components within normal limits  I-STAT CHEM 8, ED - Abnormal; Notable for the following components:   BUN 33 (*)    Creatinine, Ser 2.10 (*)    Glucose, Bld 479 (*)    Calcium, Ion 1.03 (*)    TCO2 18 (*)    Hemoglobin 5.1 (*)    HCT 15.0 (*)    All other components within normal limits  CBC WITH DIFFERENTIAL/PLATELET  COMPREHENSIVE METABOLIC PANEL  D-DIMER,  QUANTITATIVE (NOT AT [facility])  PROTIME-INR  BLOOD GAS, ARTERIAL  TYPE AND SCREEN  PREPARE RBC (CROSSMATCH)  PREPARE FRESH FROZEN PLASMA    EKG None  Radiology DG Chest Port 1 View  Result Date: 04/29/2020 CLINICAL DATA:  Redressed mint of central line. EXAM: PORTABLE CHEST 1 VIEW COMPARISON:  04/27/2020 FINDINGS: Left internal jugular central venous catheter with the tip kinked and projecting at the superior aspect of the aortic arch. Similar cardiomediastinal silhouette. Similar small left pleural effusion with overlying opacities. No visible pneumothorax on this limited supine radiograph. IMPRESSION: 1. Left internal jugular central venous catheter with the tip kinked and projecting at the superior aspect of the aortic arch. While this could represent a venous placement, arterial placement cannot be excluded on this image. Recommend correlation with blood gas analysis. 2. Similar small left pleural effusion with overlying opacities, which could represent atelectasis, aspiration, and/or pneumonia. Findings and recommendations discussed with Dr. Christy Gentles via telephone at 8:34 a.m. Electronically Signed   By: Margaretha Sheffield MD   On: 04/29/2020 08:38   DG Chest Portable 1 View  Result Date: 04/29/2020 CLINICAL DATA:  Line placement EXAM: PORTABLE CHEST 1 VIEW COMPARISON:  April 26, 2020 FINDINGS: The cardiomediastinal silhouette is unchanged and mildly enlarged in contour.LEFT IJ CVC tip terminates over the expected region of the LEFT brachiocephalic vein confluence with the subclavian. Small LEFT pleural effusion, unchanged. No pneumothorax. LEFT retrocardiac opacity, possibly atelectasis. Visualized abdomen is unremarkable. No acute osseous abnormality. IMPRESSION: LEFT IJ CVC tip terminates over the expected region of the LEFT brachiocephalic vein confluence with the subclavian. No pneumothorax. Electronically Signed   By: Valentino Saxon MD   On: 04/29/2020 07:54    Procedures .Critical  Care Performed by: Ripley Fraise, MD Authorized by: Ripley Fraise, MD   Critical care provider statement:    Critical care time (minutes):  35   Critical care start time:  04/29/2020 6:30 AM   Critical care end time:  04/29/2020 7:05 AM   Critical care time was exclusive of:  Separately billable procedures and treating other patients   Critical care was necessary to treat or prevent imminent or life-threatening deterioration of the following conditions:  Circulatory failure and shock   Critical care was time spent personally by me on the following activities:  Ordering and review of radiographic studies, ordering and review of laboratory studies, ordering and performing treatments and interventions, pulse oximetry, re-evaluation of patient's condition, review of old charts, evaluation of patient's response to treatment, discussions with consultants, development of treatment plan with patient or surrogate and examination of patient   I assumed direction of critical care for this patient from another provider in my specialty: no     Care discussed with: admitting provider   .Central Line  Date/Time: 04/29/2020 7:56 AM Performed by: Ripley Fraise, MD Authorized by: Ripley Fraise, MD   Consent:    Consent obtained:  Emergent situation Universal protocol:    Patient identity confirmed:  Provided demographic data Pre-procedure details:    Indication(s): central venous access and insufficient peripheral access     Hand hygiene: Hand hygiene performed prior to insertion     Sterile barrier technique: All elements of maximal sterile technique followed     Skin preparation:  Chlorhexidine   Skin preparation agent: Skin preparation agent completely dried prior to procedure   Procedure details:    Location:  L internal jugular   Patient position:  Trendelenburg   Procedural supplies:  Triple lumen   Catheter size:  7 Fr   Landmarks identified: yes     Ultrasound guidance: yes      Ultrasound guidance timing: real time     Sterile ultrasound techniques: Sterile gel and sterile probe covers were used     Number of attempts:  1   Successful placement: yes   Post-procedure details:    Post-procedure:  Dressing applied and line sutured   Assessment:  Blood return through all ports, no pneumothorax on x-ray and free fluid flow   Procedure completion:  Tolerated well, no immediate complications Comments:     Central line was advanced after review of initial x-ray.  No pneumothorax  Left IJ central line was placed without any difficulty.  I was able to easily visualize the internal jugular vein with ultrasound.  Due to emergent procedure, I was unable to archive the ultrasound image.  However there was no pulsatile blood, and appeared venous after it was placed.  There was no immediate complications.  I initially attempted central line in the right femoral region due to emergent need for access.  I also utilized ultrasound in this location after several attempts I was able to get blood return.  However I was unable to fully pass the wire or catheter.  I terminated the right femoral region, held appropriate pressure and there was no large or expanding hematoma.     Medications Ordered in ED Medications  0.9 %  sodium chloride infusion (has no administration in time range)  rocuronium bromide 100 MG/10ML SOSY (  Not Given 04/29/20 0828)  etomidate (AMIDATE) 2 MG/ML injection (  Not Given 04/29/20 0829)  succinylcholine (ANECTINE) 200 MG/10ML syringe (  Not Given 04/29/20 0829)  pantoprazole (PROTONIX) 80 mg in sodium chloride 0.9 % 100 mL (0.8 mg/mL) infusion (has no administration in time range)  pantoprazole (PROTONIX) injection 40 mg (has no administration in time range)  octreotide (  SANDOSTATIN) 2 mcg/mL load via infusion 50 mcg (has no administration in time range)    And  octreotide (SANDOSTATIN) 500 mcg in sodium chloride 0.9 % 250 mL (2 mcg/mL) infusion (has no  administration in time range)  norepinephrine (LEVOPHED) 4mg  in 278mL premix infusion (has no administration in time range)  pantoprazole (PROTONIX) 80 mg in sodium chloride 0.9 % 100 mL IVPB (0 mg Intravenous Stopped 04/29/20 0819)  cefTRIAXone (ROCEPHIN) 1 g in sodium chloride 0.9 % 100 mL IVPB (1 g Intravenous New Bag/Given 04/29/20 [id number])    ED Course  I have reviewed the triage vital signs and the nursing notes.  Pertinent labs & imaging results that were available during my care of the patient were reviewed by me and considered in my medical decision making (see chart for details).    MDM Rules/Calculators/A&P                          I was called to the room once patient arrived due to unresponsiveness and obvious GI bleed.  In brief, patient had recent admission was found to have esophageal varices in the setting of cirrhosis.  Patient also has asymptomatic COVID-19 Apparently after discharge he continued to have symptoms of a GI bleed.  Per EMS, he did fall once they picked him up to transport to the hospital.  Unfortunately he did sustain a laceration to his left toe that will need to be managed by orthopedics once he is stable.  He also has evidence of hitting his head, the patient is now awake and alert and answering questions, will defer CT head.  No other signs of traumatic injury  Patient had very poor IV access on arrival with only an IO in his left leg.  IV fluids were started initially, and I requested 2 units of emergency blood I elected to do any emergent right femoral central line.  After multiple attempts including with ultrasound, I was never able to guide the catheter in appropriate position.  I removed the catheter and help pressure and there was no hematoma and no active bleeding.  I then proceeded to use the left internal jugular as the next spot for central access.  Due to his anatomy this was easily accessible.  With ultrasound guidance I was able to easily find the left  internal jugular vein without any difficulty.  It appeared obviously venous on initial stick.  Central line was placed without difficulty.  After reviewing the x-ray with critical care physician, we agreed that I would advance the central line as it appeared to be shallow.  I sterilized the area and did advance the catheter.  On repeat x-ray, the radiologist request that we perform a blood gas just to ensure that it is in an appropriate venous position.  There is no pneumothorax, and patient tolerated the procedure well.  Fortunately he does have other peripheral access at this time and is prudent to await a blood gas prior to using the central line.  Case was also discussed with Dr. Carlean Purl with [facility]. Patient was transferred to the ICU in critical condition Final Clinical Impression(s) / ED Diagnoses Final diagnoses:  Hematemesis, presence of nausea not specified  Upper GI bleed  Hemorrhagic shock [facility])    Rx / DC Orders ED Discharge Orders    None       Ripley Fraise, MD 04/29/20 [id number]

## 2020-04-29 NOTE — Consult Note (Signed)
Reason for Consult:Left foot lacs Referring Physician: Jeanella Craze Time called: 1115 Time at bedside: [place] is an 70 y.o. male.  HPI: Diovanni reportedly fell at home either Sunday or Monday, likely 2/2 to symptomatic anemia from a GIB. He was seriously unstable on arrival. He was noted to have an injury to his left foot that was deep and orthopedic surgery was consulted. He was seen in the OR prior to a vascular procedure and could not contribute to history or exam.  Past Medical History:  Diagnosis Date  . Allergy    seasonal allergies  . Anxiety    on meds  . Aortic valve disorder   . Arthritis    generalized (fingers)(shoulders)  . Asthma    uses inhaler  . Cataract    bilateral -sx   . Cirrhosis ([place])   . CKD (chronic kidney disease), stage III ([place])    only has one kidney  . COPD (chronic obstructive pulmonary disease) ([place])   . Depression    on meds  . DM (diabetes mellitus) ([place])    on meds  . GERD (gastroesophageal reflux disease)    on meds  . Heart murmur   . History of colon polyps   . Hx of acute pancreatitis 10/2019  . Hyperlipidemia    on meds  . Hypertension    on meds  . Hypothyroidism    not on meds at this time  . Myelodysplastic syndrome ([place])   . Neuromuscular disorder ([place])    per pt  . Obstructive sleep apnea   . Parkinsonism ([place])    per pt report  . Peripheral positional vertigo     Past Surgical History:  Procedure Laterality Date  . ANKLE SURGERY    . CHOLECYSTECTOMY    . ESOPHAGEAL BANDING N/A 04/17/2020   Procedure: ESOPHAGEAL BANDING;  Surgeon: Yetta Flock, MD;  Location: [facility];  Service: Gastroenterology;  Laterality: N/A;  . ESOPHAGOGASTRODUODENOSCOPY (EGD) WITH PROPOFOL N/A 04/17/2020   Procedure: ESOPHAGOGASTRODUODENOSCOPY (EGD) WITH PROPOFOL;  Surgeon: Yetta Flock, MD;  Location: [facility];  Service: Gastroenterology;  Laterality: N/A;  . EYE SURGERY    . HERNIA REPAIR    . KIDNEY STONE  SURGERY    . WISDOM TOOTH EXTRACTION      Family History  Problem Relation Age of Onset  . Liver cancer Mother   . Colon cancer Neg Hx   . Stomach cancer Neg Hx     Social History:  reports that he has never smoked. He has never used smokeless tobacco. He reports previous alcohol use. He reports previous drug use.  Allergies:  Allergies  Allergen Reactions  . Lisinopril Cough    Medications: I have reviewed the patient's current medications.  Results for orders placed or performed during the hospital encounter of 04/29/20 (from the past 48 hour(s))  Glucose, capillary     Status: Abnormal   Collection Time: 04/29/20  6:25 AM  Result Value Ref Range   Glucose-Capillary 403 (H) 70 - 99 mg/dL    Comment: Glucose reference range applies only to samples taken after fasting for at least 8 hours.  Lactic acid, plasma     Status: Abnormal   Collection Time: 04/29/20  6:29 AM  Result Value Ref Range   Lactic Acid, Venous 9.7 (HH) 0.5 - 1.9 mmol/L    Comment: CRITICAL RESULT CALLED TO, READ BACK BY AND VERIFIED WITH: Noah Delaine RN [phone] [id number] BY A BENNETT Performed  at [facility], [place] [address] [place], [place] [address]   Prepare RBC (crossmatch)     Status: None   Collection Time: 04/29/20  6:29 AM  Result Value Ref Range   Order Confirmation      ORDER PROCESSED BY BLOOD BANK Performed at [facility], [place] [address] [place], [place] [address]   I-stat chem 8, ED (not at [facility] or  [facility])     Status: Abnormal   Collection Time: 04/29/20  6:29 AM  Result Value Ref Range   Sodium 135 135 - 145 mmol/L   Potassium 3.9 3.5 - 5.1 mmol/L   Chloride 102 98 - 111 mmol/L   BUN 33 (H) 8 - 23 mg/dL   Creatinine, Ser 2.10 (H) 0.61 - 1.24 mg/dL   Glucose, Bld 479 (H) 70 - 99 mg/dL    Comment: Glucose reference range applies only to samples taken after fasting for at least 8 hours.   Calcium, Ion 1.03 (L) 1.15 - 1.40 mmol/L   TCO2 18 (L) 22 - 32 mmol/L   Hemoglobin 5.1 (LL)  13.0 - 17.0 g/dL   HCT 15.0 (L) 39.0 - 52.0 %   Comment NOTIFIED PHYSICIAN   Type and screen     Status: None (Preliminary result)   Collection Time: 04/29/20  6:30 AM  Result Value Ref Range   ABO/RH(D) O POS    Antibody Screen NEG    Sample Expiration      05/02/2020,2359 Performed at [place] [address], [place], [place] [address]    Unit Number [id number]    Blood Component Type RED CELLS,LR    Unit division 00    Status of Unit ISSUED    Transfusion Status OK TO TRANSFUSE    Crossmatch Result COMPATIBLE    Unit Number [id number]    Blood Component Type RED CELLS,LR    Unit division 00    Status of Unit ISSUED    Transfusion Status OK TO TRANSFUSE    Crossmatch Result Compatible    Unit Number [id number]    Blood Component Type RED CELLS,LR    Unit division 00    Status of Unit ISSUED    Transfusion Status OK TO TRANSFUSE    Crossmatch Result Compatible    Unit Number [id number]    Blood Component Type RED CELLS,LR    Unit division 00    Status of Unit ALLOCATED    Transfusion Status OK TO TRANSFUSE    Crossmatch Result Compatible    Unit Number [id number]    Blood Component Type RBC LR PHER2    Unit division 00    Status of Unit ALLOCATED    Transfusion Status OK TO TRANSFUSE    Crossmatch Result Compatible    Unit Number [id number]    Blood Component Type RED CELLS,LR    Unit division 00    Status of Unit ALLOCATED    Transfusion Status OK TO TRANSFUSE    Crossmatch Result Compatible    Unit Number [id number]    Blood Component Type RBC LR PHER2    Unit division 00    Status of Unit ALLOCATED    Transfusion Status OK TO TRANSFUSE    Crossmatch Result Compatible    Unit Number [id number]    Blood Component Type RED CELLS,LR    Unit division 00    Status of Unit ISSUED    Transfusion Status OK TO TRANSFUSE    Crossmatch Result COMPATIBLE  Prepare fresh frozen plasma     Status: None   Collection Time: 04/29/20   7:22 AM  Result Value Ref Range   Unit Number [id number]    Blood Component Type THAWED PLASMA    Unit division 00    Status of Unit REL FROM [facility]    Transfusion Status OK TO TRANSFUSE    Unit Number [id number]    Blood Component Type THAWED PLASMA    Unit division 00    Status of Unit REL FROM [facility]    Transfusion Status      OK TO TRANSFUSE Performed at [facility], [place] [address], [place], [place] [address]   Comprehensive metabolic panel     Status: Abnormal   Collection Time: 04/29/20  8:15 AM  Result Value Ref Range   Sodium 135 135 - 145 mmol/L   Potassium 4.8 3.5 - 5.1 mmol/L   Chloride 104 98 - 111 mmol/L   CO2 19 (L) 22 - 32 mmol/L   Glucose, Bld 464 (H) 70 - 99 mg/dL    Comment: Glucose reference range applies only to samples taken after fasting for at least 8 hours.   BUN 26 (H) 8 - 23 mg/dL   Creatinine, Ser 2.38 (H) 0.61 - 1.24 mg/dL   Calcium 7.1 (L) 8.9 - 10.3 mg/dL   Total Protein 4.2 (L) 6.5 - 8.1 g/dL   Albumin 1.8 (L) 3.5 - 5.0 g/dL   AST 40 15 - 41 U/L   ALT 30 0 - 44 U/L   Alkaline Phosphatase 64 38 - 126 U/L   Total Bilirubin 0.8 0.3 - 1.2 mg/dL   GFR, Estimated 28 (L) >60 mL/min    Comment: (NOTE) Calculated using the CKD-EPI Creatinine Equation (2021)    Anion gap 12 5 - 15    Comment: Performed at [facility], [place] [address] [place], [place] [address]  D-dimer, quantitative (not at [facility])     Status: Abnormal   Collection Time: 04/29/20  8:15 AM  Result Value Ref Range   D-Dimer, Quant 17.33 (H) 0.00 - 0.50 ug/mL-FEU    Comment: (NOTE) At the manufacturer cut-off value of 0.5 g/mL FEU, this assay has a negative predictive value of 95-100%.This assay is intended for use in conjunction with a clinical pretest probability (PTP) assessment model to exclude pulmonary embolism (PE) and deep venous thrombosis (DVT) in outpatients suspected of PE or DVT. Results should be correlated with clinical presentation. Performed at  [facility], [place] [address] [place], [place] [address]   Protime-INR     Status: Abnormal   Collection Time: 04/29/20  8:15 AM  Result Value Ref Range   Prothrombin Time 19.4 (H) 11.4 - 15.2 seconds   INR 1.7 (H) 0.8 - 1.2    Comment: (NOTE) INR goal varies based on device and disease states. Performed at [facility], [place] [address] [place], [place] [address]   Hemoglobin A1c     Status: Abnormal   Collection Time: 04/29/20  9:23 AM  Result Value Ref Range   Hgb A1c MFr Bld 6.6 (H) 4.8 - 5.6 %    Comment: (NOTE) Pre diabetes:          5.7%-6.4%  Diabetes:              >6.4%  Glycemic control for   <7.0% adults with diabetes    Mean Plasma Glucose 142.72 mg/dL    Comment: Performed at [facility], [place] [address], [place], [place] [address]  Prepare Pheresed Platelets     Status: None (Preliminary result)   Collection Time: 04/29/20  9:38 AM  Result Value Ref Range   Unit Number [id number]    Blood Component Type PLTP3 PSORALEN TREATED    Unit division 00    Status of Unit ISSUED    Transfusion Status      OK TO TRANSFUSE Performed at [place] [address], [place], [place] [address]   Prepare fresh frozen plasma     Status: None (Preliminary result)   Collection Time: 04/29/20  9:38 AM  Result Value Ref Range   Unit Number [id number]    Blood Component Type THW PLS APHR    Unit division B0    Status of Unit ISSUED    Transfusion Status OK TO TRANSFUSE    Unit Number [id number]    Blood Component Type THW PLS APHR    Unit division 00    Status of Unit ISSUED    Transfusion Status      OK TO TRANSFUSE Performed at [facility], [place] [address] [place], [place] [address]   Glucose, capillary     Status: Abnormal   Collection Time: 04/29/20 12:52 PM  Result Value Ref Range   Glucose-Capillary 288 (H) 70 - 99 mg/dL    Comment: Glucose reference range applies only to samples taken after fasting for at least 8 hours.   Lactic acid, plasma     Status: Abnormal   Collection Time: 04/29/20 12:54 PM  Result Value Ref Range   Lactic Acid, Venous 2.4 (HH) 0.5 - 1.9 mmol/L    Comment: CRITICAL VALUE NOTED.  VALUE IS CONSISTENT WITH PREVIOUSLY REPORTED AND CALLED VALUE. Performed at [facility], [place] [address] [place], [place] [address]   MRSA PCR Screening     Status: None   Collection Time: 04/29/20 12:54 PM   Specimen: Nasopharyngeal  Result Value Ref Range   MRSA by PCR NEGATIVE NEGATIVE    Comment:        The GeneXpert MRSA Assay (FDA approved for NASAL specimens only), is one component of a comprehensive MRSA colonization surveillance program. It is not intended to diagnose MRSA infection nor to guide or monitor treatment for MRSA infections. Performed at [facility], [place] [address] [place], [place] [address]   Hemoglobin and hematocrit, blood     Status: Abnormal   Collection Time: 04/29/20 12:55 PM  Result Value Ref Range   Hemoglobin 8.4 (L) 13.0 - 17.0 g/dL   HCT 25.4 (L) 39.0 - 52.0 %    Comment: Performed at [facility] [address] [place], [place] [address]  Provider-confirm verbal Blood Bank order - RBC, Type & Screen; 2 Units; Order taken: 04/29/2020; 6:30 AM; Level 1 Trauma 2 RBC ordered and issed     Status: None   Collection Time: 04/29/20  1:43 PM  Result Value Ref Range   Blood product order confirm      MD AUTHORIZATION REQUESTED Performed at [facility], [place] [address], [place], [place] [address]     DG CHEST PORT 1 VIEW  Result Date: 04/29/2020 CLINICAL DATA:  Central line placement EXAM: PORTABLE CHEST 1 VIEW COMPARISON:  04/29/2020 chest radiograph. FINDINGS: Right internal jugular central venous catheter terminates in lower third of the SVC. Left internal jugular central venous catheter terminates in high left mediastinum at the superior  margin of the aortic arch in an indeterminate position. Pacer pads overlie chest bilaterally. Stable  cardiomediastinal silhouette with top-normal heart size. No pneumothorax. Stable small left pleural effusion. No right pleural effusion. No pulmonary edema. Stable mild left basilar hazy opacity, favor atelectasis. IMPRESSION: 1. Right internal jugular central venous catheter terminates in the lower third of the SVC. 2. Left internal jugular central venous catheter terminates in the high left mediastinum at the superior margin of the aortic arch in an indeterminate position, suspicious for arterial placement as previously mentioned. 3. Stable small left pleural effusion and mild left basilar hazy opacity, favor atelectasis. Critical Value/emergent results were called by telephone at the time of interpretation on 04/29/2020 at 11:12 am to provider DR. CHANDS, who verbally acknowledged these results. The left neck catheter has been removed by the time of this interpretation. Electronically Signed   By: Ilona Sorrel M.D.   On: 04/29/2020 11:13   DG Chest Port 1 View  Result Date: 04/29/2020 CLINICAL DATA:  Redressed mint of central line. EXAM: PORTABLE CHEST 1 VIEW COMPARISON:  04/27/2020 FINDINGS: Left internal jugular central venous catheter with the tip kinked and projecting at the superior aspect of the aortic arch. Similar cardiomediastinal silhouette. Similar small left pleural effusion with overlying opacities. No visible pneumothorax on this limited supine radiograph. IMPRESSION: 1. Left internal jugular central venous catheter with the tip kinked and projecting at the superior aspect of the aortic arch. While this could represent a venous placement, arterial placement cannot be excluded on this image. Recommend correlation with blood gas analysis. 2. Similar small left pleural effusion with overlying opacities, which could represent atelectasis, aspiration, and/or pneumonia. Findings and recommendations discussed with Dr. Christy Gentles via telephone at 8:34 a.m. Electronically Signed   By: Margaretha Sheffield MD    On: 04/29/2020 08:38   DG Chest Portable 1 View  Result Date: 04/29/2020 CLINICAL DATA:  Line placement EXAM: PORTABLE CHEST 1 VIEW COMPARISON:  April 26, 2020 FINDINGS: The cardiomediastinal silhouette is unchanged and mildly enlarged in contour.LEFT IJ CVC tip terminates over the expected region of the LEFT brachiocephalic vein confluence with the subclavian. Small LEFT pleural effusion, unchanged. No pneumothorax. LEFT retrocardiac opacity, possibly atelectasis. Visualized abdomen is unremarkable. No acute osseous abnormality. IMPRESSION: LEFT IJ CVC tip terminates over the expected region of the LEFT brachiocephalic vein confluence with the subclavian. No pneumothorax. Electronically Signed   By: Valentino Saxon MD   On: 04/29/2020 07:54   DG Foot 2 Views Left  Result Date: 04/29/2020 CLINICAL DATA:  Fall. EXAM: LEFT FOOT - 2 VIEW COMPARISON:  None. FINDINGS: No acute fracture or dislocation is identified. There are large posterior and plantar calcaneal enthesophytes. There is the suggestion of soft tissue swelling at the medial aspect of the ankle. Atherosclerotic vascular calcifications are noted. IMPRESSION: No acute osseous abnormality identified. Electronically Signed   By: Logan Bores M.D.   On: 04/29/2020 13:20    Review of Systems  Unable to perform ROS: Intubated   Blood pressure (!) 107/54, pulse 85, temperature 97.6 F (36.4 C), temperature source Axillary, resp. rate (!) 26, SpO2 95 %. Physical Exam Constitutional:      General: He is not in acute distress. HENT:     Head: Normocephalic.  Eyes:     General:        Right eye: No discharge.        Left eye: No discharge.  Neck:     Comments: Intubated Cardiovascular:  Rate and Rhythm: Normal rate and regular rhythm.     Pulses: Decreased pulses.  Pulmonary:     Effort: No respiratory distress.  Musculoskeletal:     Comments: LLE Transverse lacs to base of plantar 3rd and 4th toes, phalanges palpable, no  ecchymosis or rash  No knee or ankle effusion  Knee stable to varus/ valgus and anterior/posterior stress  Sens DPN, SPN, TN could not assess  Motor EHL, ext, flex, evers could not assess  DP 1+, PT 0, No significant edema  Skin:    General: Skin is warm.  Psychiatric:     Comments: Intubated     Assessment/Plan: Left foot lacs -- Given unknown length of time I am reluctant to close these wounds. His natural hammertoe contracture should keep these wounds approximated well-enough. Would dress with Xeroform and dry dressing q12h. Also recommend washing with soap and water at dressing change; dry well. Would continue abx for 7d and make sure drug with G+ coverage is started if antibiotics change.    Lisette Abu, PA-C Orthopedic Surgery [phone] 04/29/2020, 2:31 PM

## 2020-04-29 NOTE — Telephone Encounter (Signed)
Dr. Armbruster, please see below. Thanks.  

## 2020-04-29 NOTE — ED Triage Notes (Signed)
Pt BIBA for increased weakness and vomiting blood along with bloody diarrhea. Pt recently dc'd from hospital 2 days ago and sent home. Pt is responsive to sternal rub. EMS approximated 1L of bloody vomit en route. Pt is pale.

## 2020-04-29 NOTE — Telephone Encounter (Signed)
I called the patient's wife and reviewed his chart.  He has had recurrent hematemesis and hemorrhagic shock, our inpatient service is taking care of him and he is going for endoscopy today.  He has responded to blood transfusion.  Unfortunately he has very large esophageal varices, he did not tolerate beta-blockade due to symptomatic bradycardia, thus we treated them with band ligation about 2 weeks ago.  Unfortunately he had bleeding thought to be related to a post banding ulcer last weekend.  Endoscopy showed no high risk stigmata at the time per Dr. Benson Norway, and he was treated conservatively and discharged, readmitted today with recurrent symptoms.  He is going for endoscopy this afternoon we will see what they find.  I discussed the situation with the wife and answered her questions, our practice will continue to provide his inpatient GI service and I will see him after he is discharged from the hospital in follow-up.

## 2020-04-29 NOTE — Transfer of Care (Signed)
Immediate Anesthesia Transfer of Care Note  Patient: Corey Wood  Procedure(s) Performed: CAROTID EXPLORATION removal of  central venous catheter (Left Neck)  Patient Location: ICU  Anesthesia Type:General  Level of Consciousness: sedated and unresponsive  Airway & Oxygen Therapy: Patient remains intubated per anesthesia plan and Patient placed on Ventilator (see vital sign flow sheet for setting)  Post-op Assessment: Report given to RN and Post -op Vital signs reviewed and stable  Post vital signs: Reviewed and stable  Last Vitals:  Vitals Value Taken Time  BP 92/53 04/29/20 1545  Temp    Pulse 66 04/29/20 1556  Resp 20 04/29/20 1556  SpO2 100 % 04/29/20 1556  Vitals shown include unvalidated device data.  Last Pain:  Vitals:   04/29/20 1310  TempSrc: Axillary         Complications: No complications documented.

## 2020-04-29 NOTE — Consult Note (Signed)
Vascular and Vein Specialist of [place]  Patient name: Corey Wood MRN: [id number] DOB: Feb 10, 1951 Sex: male  HPI: Corey Wood is a 70 y.o. male seen in consultation for probable carotid artery placement of central catheter.  Patient is a very complex past history.  He has had multiple admissions for major esophageal GI bleeds.  He was just discharged home and then was readmitted this morning with recurrent major bleed and hypotension.  He had a left IJ central line placed in the emergency department.  This was done under ultrasound visualization.  The report was that there was no pulsatile blood and that it did appear to be venous.  It should be noted the patient was significantly hypotensive at the time.  Once he was admitted to the medical critical care unit I was found that he could not have aspiration and the radiographs appear to show a abnormal course of the catheter is more consistent with arterial placement.  A right IJ catheter was placed by critical care medicine.  Vascular is consulted for addressing the probable carotid artery placement of the central line.  The patient has coagulopathy related to his bleeding.  Platelet count in the 60,000 and INR elevated.  Past Medical History:  Diagnosis Date  . Allergy    seasonal allergies  . Anxiety    on meds  . Aortic valve disorder   . Arthritis    generalized (fingers)(shoulders)  . Asthma    uses inhaler  . Cataract    bilateral -sx   . Cirrhosis ([place])   . CKD (chronic kidney disease), stage III ([place])    only has one kidney  . COPD (chronic obstructive pulmonary disease) ([place])   . Depression    on meds  . DM (diabetes mellitus) ([place])    on meds  . GERD (gastroesophageal reflux disease)    on meds  . Heart murmur   . History of colon polyps   . Hx of acute pancreatitis 10/2019  . Hyperlipidemia    on meds  . Hypertension    on meds  . Hypothyroidism    not on meds at this  time  . Myelodysplastic syndrome ([place])   . Neuromuscular disorder ([place])    per pt  . Obstructive sleep apnea   . Parkinsonism ([place])    per pt report  . Peripheral positional vertigo     Family History  Problem Relation Age of Onset  . Liver cancer Mother   . Colon cancer Neg Hx   . Stomach cancer Neg Hx     SOCIAL HISTORY: Social History   Tobacco Use  . Smoking status: Never Smoker  . Smokeless tobacco: Never Used  Substance Use Topics  . Alcohol use: Not Currently    Allergies  Allergen Reactions  . Lisinopril Cough    Current Facility-Administered Medications  Medication Dose Route Frequency Provider Last Rate Last Admin  . 0.9 %  sodium chloride infusion  10 mL/hr Intravenous Once Ripley Fraise, MD      . albuterol (VENTOLIN HFA) 108 (90 Base) MCG/ACT inhaler 2 puff  2 puff Inhalation Q4H PRN Arnell Asal, NP      . Derrill Memo ON 04/30/2020] cefTRIAXone (ROCEPHIN) 1 g in sodium chloride 0.9 % 100 mL  IVPB  1 g Intravenous Q24H Gribbin, Charlynne Cousins, PA-C      . etomidate (AMIDATE) 2 MG/ML injection           . insulin aspart (novoLOG) injection 0-9 Units  0-9 Units Subcutaneous Q4H Jennelle Human B, NP      . metoCLOPramide (REGLAN) injection 10 mg  10 mg Intravenous Once Mansouraty, Telford Nab., MD      . norepinephrine (LEVOPHED) 4mg  in 250mL premix infusion  0-40 mcg/min Intravenous Titrated Jacky Kindle, MD 30 mL/hr at 04/29/20 1200 8 mcg/min at 04/29/20 1200  . octreotide (SANDOSTATIN) 500 mcg in sodium chloride 0.9 % 250 mL (2 mcg/mL) infusion  50 mcg/hr Intravenous Continuous Lucrezia Starch, MD 25 mL/hr at 04/29/20 1200 50 mcg/hr at 04/29/20 1200  . pantoprazole (PROTONIX) 80 mg in sodium chloride 0.9 % 100 mL (0.8 mg/mL) infusion  8 mg/hr Intravenous Continuous Lucrezia Starch, MD      . Derrill Memo ON 05/02/2020] pantoprazole (PROTONIX) injection 40 mg  40 mg Intravenous Q12H Lucrezia Starch, MD      . phytonadione (VITAMIN K) 10 mg in dextrose 5 % 50 mL IVPB   10 mg Intravenous Once Mansouraty, Telford Nab., MD 50 mL/hr at 04/29/20 1307 10 mg at 04/29/20 1307  . rocuronium bromide 100 MG/10ML SOSY           . succinylcholine (ANECTINE) 200 MG/10ML syringe             REVIEW OF SYSTEMS:  Reviewed and as history and physical  PHYSICAL EXAM: Vitals:   04/29/20 1150 04/29/20 1200 04/29/20 1215 04/29/20 1230  BP:  104/87 114/87 (!) 93/49  Pulse: 68 68 68 72  Resp: 15 18 (!) 22 (!) 24  Temp: 97.8 F (36.6 C)     TempSrc: Axillary     SpO2: 100% 100% 100% 99%    GENERAL: The patient is a well-nourished male, in no acute distress. The vital signs are documented above. CARDIOVASCULAR: Left neck central line in place.  No hematoma around the line. PULMONARY: There is good air exchange  MUSCULOSKELETAL: There are no major deformities or cyanosis. NEUROLOGIC: No focal weakness or paresthesias are detected. SKIN: There are no ulcers or rashes noted. PSYCHIATRIC: The patient has a normal affect.  DATA:  X-ray shows abnormal positioning of the central line.  Platelet count diminished to 63,000.  Hemoglobin and hematocrit 5 and 15.  INR 1.7  MEDICAL ISSUES: Probable carotid arterial placement of central line.  Patient with major GI bleed related to esophageal varices.  Coagulopathic related to the bleed.  Do not feel it is safe to simply remove this line since it appears to be in the carotid artery.  Recommend removal in the operating room with ability to directly visualize the carotid artery.  I discussed this with the patient who expresses understanding although he is somewhat altered mental status related to his current condition.  Attempted to call his wife by telephone and did not receive an answer.  We will continue to attempt to contact her    Rosetta Posner, MD [facility] Vascular and Vein Specialists of [facility] Tel [phone]

## 2020-04-29 NOTE — Telephone Encounter (Signed)
Patients wife called states the patient was not well and they called EMS got transported to [facility] and she is wanting to let Dr. Havery Moros know. She is also wanting to get some information and or results

## 2020-04-29 NOTE — Op Note (Signed)
    OPERATIVE REPORT  DATE OF SURGERY: 04/29/2020  PATIENT: Corey Wood, 70 y.o. male MRN: [id number]  DOB: 19-Mar-1951  PRE-OPERATIVE DIAGNOSIS: Left central line with possible carotid artery access  POST-OPERATIVE DIAGNOSIS:  Same  PROCEDURE: Left neck exploration and removal under direct vision of left internal jugular line  SURGEON:  Curt Jews, M.D.  PHYSICIAN ASSISTANT: Rhyne PA C  The assistant was needed for exposure and to expedite the case  ANESTHESIA: General  EBL: per anesthesia record  Total I/O In: 971 [I.V.:132; Blood:661; IV Piggyback:178] Out: -   BLOOD ADMINISTERED: none  DRAINS: none  SPECIMEN: none  COUNTS CORRECT:  YES  PATIENT DISPOSITION:  PACU - hemodynamically stable  PROCEDURE DETAILS: Patient was taken up and placed supine xiphoid area the left neck was prepped draped you sterile fashion.  An incision was made at the base of the left neck between the clavicle and the central line entry site.  Dissection was continued along the central line through the sternocleidomastoid down to the internal jugular vein entry site.  The internal jugular vein was mobilized circumferentially down towards the sternal notch.  The central venous line was intraluminal and vein with no evidence of exit or arterial puncture.  The catheter was removed and the hole in the internal jugular vein was controlled with a 5-0 Prolene figure-of-eight suture.  The wound was irrigated with saline.  Hemostasis was obtained with electrocautery.  The wound was closed with 3-0 Vicryl in the platysma.  Skin was closed with 4-0 subcuticular Vicryl stitch.  Sterile dressing was applied and the patient was transferred to the recovery in stable condition   Rosetta Posner, M.D., [facility] 04/29/2020 3:19 PM

## 2020-04-29 NOTE — Procedures (Signed)
Central Venous Catheter Insertion Procedure Note  Corey Wood  [id number]  11-05-1950  Date:04/29/20  Time:10:45 AM   Provider Performing:Brooke Moshe Cipro   Procedure: Insertion of Non-tunneled Central Venous [phone]) with US guidance ([id number])   Indication(s) Medication administration and Difficult access; prior left IJ CVL not able draw blood or flush  Consent Risks of the procedure as well as the alternatives and risks of each were explained to the patient and/or caregiver.  Consent for the procedure was obtained and is signed in the bedside chart  Anesthesia Topical only with 1% lidocaine   Timeout Verified patient identification, verified procedure, site/side was marked, verified correct patient position, special equipment/implants available, medications/allergies/relevant history reviewed, required imaging and test results available.  Sterile Technique Maximal sterile technique including full sterile barrier drape, hand hygiene, sterile gown, sterile gloves, mask, hair covering, sterile ultrasound probe cover (if used).  Procedure Description Area of catheter insertion was cleaned with chlorhexidine and draped in sterile fashion.  With real-time ultrasound guidance a central venous catheter was placed into the right internal jugular vein to 16 cm.   Nonpulsatile blood flow and easy flushing noted in all ports.  The catheter was sutured in place, biopatch, and sterile dressing applied.  Complications/Tolerance None; patient tolerated the procedure well. Chest X-ray is ordered to verify placement for internal jugular or subclavian cannulation.   Chest x-ray is not ordered for femoral cannulation.  EBL Minimal   Specimen(s) None    Kennieth Rad, ACNP [facility] 04/29/2020, 10:47 AM

## 2020-04-29 NOTE — Anesthesia Procedure Notes (Signed)
Procedure Name: Intubation Date/Time: 04/29/2020 2:10 PM Performed by: Mariea Clonts, CRNA Pre-anesthesia Checklist: Patient identified, Emergency Drugs available, Suction available and Patient being monitored Patient Re-evaluated:Patient Re-evaluated prior to induction Oxygen Delivery Method: Circle System Utilized Preoxygenation: Pre-oxygenation with 100% oxygen Induction Type: IV induction Ventilation: Mask ventilation without difficulty Laryngoscope Size: Glidescope and 4 Grade View: Grade I Tube type: Oral Tube size: 7.5 mm Number of attempts: 1 Airway Equipment and Method: Stylet and Oral airway Placement Confirmation: ETT inserted through vocal cords under direct vision,  positive ETCO2 and breath sounds checked- equal and bilateral Tube secured with: Tape Dental Injury: Teeth and Oropharynx as per pre-operative assessment  Difficulty Due To: Difficulty was unanticipated and Difficult Airway- due to anterior larynx Comments: Dl with miller 2 with grade 3 view.  Easy mask with oral airway.  Dl with glidescope with grade 1 view.

## 2020-04-29 NOTE — Consult Note (Signed)
Corey Wood Gastroenterology Consult: 8:25 AM 04/29/2020  LOS: 0 days    Referring Provider: Dr Tacy Learn  Primary Care Physician:  [name], MD at the [facility]. Primary Gastroenterologist:  Dr Havery Moros    Reason for Consultation:  Hematemesis/melena.  hax esoph variceal banding.  NASH cirrhosis.     HPI: Corey Wood is a 70 y.o. male.  PMH CKD 3.  NASH cirrhosis.  COPD.  Diabetes.  Gallstone pancreatitis July 2021.  Cholecystectomy.  Colon polyps.  Myelodysplastic syndrome.  OSA.  Patient reported parkinsonism.  [place].  IDA. During admission with gallstone pancreatitis in July that started at [facility] and ended up transferred to [facility], CT showed portal hypertension, esophageal varices, esophageal wall thickening, cirrhosis. Process was attributed to NASH, very minimal alcohol intake of about 2 alcoholic beverages per year.  IgG is 2562 in early November.  04/17/2020 EGD with 11 bands placed to large esophageal varices.  Type II (GOV2) gastric varices.  Antral erythema.  Normal examined duodenum.  Presented to the hospital over the weekend with hematemesis.  Hgb went from 10.3-8.6 while in the ED.  He had incidental finding of COVID-19 positivity. 04/25/2020 EGD by Dr. Benson Norway showed persistent large esophageal varices and esophageal ulcers without stigmata or active bleeding.  Portal hypertensive gastropathy, some food residue in the stomach, unremarkable duodenum.  No interventions performed.  Leading was attributed from one of the ulcers at site of prior banding.   Treated with ceftriaxone, octreotide, Protonix.  Hb 6.9 >> 7.8.  Platelets 63K. Apparently got PRBCs prior to arrival.   WBCs 2.5.  Dr. Benson Norway noted if rebleeding recurs, repeat EGD with banding but otherwise best to monitor patient and maintain  Hgb between 8-10 not higher to keep the portal pressures lower. Was discharged within a couple of days.  Was to restart low-dose aspirin on 2/4.  Cipro 500 bid for 5 days (SBP prophylaxis).  Iron 3 times weekly.  Protonix 40/day  Early this AM pt developed recurrent hematemesis, melena, altered mental status. Syncope?, fell at home. EMS approximated 1 L of bloody emesis during transport and patient arrived hypotensive but has responded to IV fluid resuscitation. Notes mention he received 4 PRBCs, FFP PTA through [facility] ED.   Ordered but not yet initiated are transfusion with 2 PRBCs.  Central line placed but is currently non-functional so PPI and octreotide, have yet to get started.  Rocephin received.  Intra osseus line placed to LLE.  Current BPs 68/44.  HR 60s.  sats in 90s.  Not intubated.    Hgb 5.1.  No coags. AKI, GFR 38.  Normal LFTs.  Elevated lactate.     Past Medical History:  Diagnosis Date  . Allergy    seasonal allergies  . Anxiety    on meds  . Aortic valve disorder   . Arthritis    generalized (fingers)(shoulders)  . Asthma    uses inhaler  . Cataract    bilateral -sx   . Cirrhosis ([place])   . CKD (chronic kidney disease), stage III ([place])  only has one kidney  . COPD (chronic obstructive pulmonary disease) ([place])   . Depression    on meds  . DM (diabetes mellitus) ([place])    on meds  . GERD (gastroesophageal reflux disease)    on meds  . Heart murmur   . History of colon polyps   . Hx of acute pancreatitis 10/2019  . Hyperlipidemia    on meds  . Hypertension    on meds  . Hypothyroidism    not on meds at this time  . Myelodysplastic syndrome ([place])   . Neuromuscular disorder ([place])    per pt  . Obstructive sleep apnea   . Parkinsonism ([place])    per pt report  . Peripheral positional vertigo     Past Surgical History:  Procedure Laterality Date  . ANKLE SURGERY    . CHOLECYSTECTOMY    . ESOPHAGEAL BANDING N/A 04/17/2020   Procedure: ESOPHAGEAL  BANDING;  Surgeon: Yetta Flock, MD;  Location: [facility];  Service: Gastroenterology;  Laterality: N/A;  . ESOPHAGOGASTRODUODENOSCOPY (EGD) WITH PROPOFOL N/A 04/17/2020   Procedure: ESOPHAGOGASTRODUODENOSCOPY (EGD) WITH PROPOFOL;  Surgeon: Yetta Flock, MD;  Location: [facility];  Service: Gastroenterology;  Laterality: N/A;  . EYE SURGERY    . HERNIA REPAIR    . KIDNEY STONE SURGERY    . WISDOM TOOTH EXTRACTION      Prior to Admission medications   Medication Sig Start Date End Date Taking? Authorizing Provider  albuterol (VENTOLIN HFA) 108 (90 Base) MCG/ACT inhaler Inhale 2 puffs into the lungs daily as needed for wheezing or shortness of breath.    [provider]  ARTIFICIAL SALIVA MT Use as directed 4 sprays in the mouth or throat at bedtime.    [provider]  aspirin 81 MG chewable tablet Chew 1 tablet (81 mg total) by mouth daily. 05/02/20   Pokhrel, Corrie Mckusick, MD  atorvastatin (LIPITOR) 20 MG tablet Take 20 mg by mouth at bedtime.    [provider]  buPROPion (WELLBUTRIN XL) 150 MG 24 hr tablet Take 150 mg by mouth daily.    [provider]  cetirizine (ZYRTEC) 10 MG chewable tablet Chew 10 mg by mouth at bedtime.    [provider]  ciprofloxacin (CIPRO) 500 MG tablet Take 1 tablet (500 mg total) by mouth 2 (two) times daily for 5 days. 04/27/20 05/02/20  Pokhrel, Corrie Mckusick, MD  diclofenac Sodium (VOLTAREN) 1 % GEL Apply 2 g topically 2 (two) times daily as needed (pain).    [provider]  ferrous sulfate 325 (65 FE) MG tablet Take 325 mg by mouth every Monday, Wednesday, and Friday.    [provider]  fluticasone (FLONASE) 50 MCG/ACT nasal spray Place 2 sprays into both nostrils daily.    [provider]  glucose 4 GM chewable tablet Chew 1 tablet by mouth daily as needed for low blood sugar (If drop below 70).    [provider]  HM LIDOCAINE PATCH EX Apply 1 patch topically every 12  (twelve) hours as needed (Pain). 5%    [provider]  insulin glargine (LANTUS) 100 unit/mL SOPN Inject 12 Units into the skin daily.    [provider]  ipratropium (ATROVENT) 0.03 % nasal spray Place 2 sprays into both nostrils at bedtime.    [provider]  ketotifen (ZADITOR) 0.025 % ophthalmic solution Place 1 drop into both eyes at bedtime.    [provider]  losartan (COZAAR) 100 MG tablet  Take 100 mg by mouth at bedtime.    [provider]  meclizine (ANTIVERT) 25 MG tablet Take 25 mg by mouth 2 (two) times daily.    [provider]  montelukast (SINGULAIR) 10 MG tablet Take 10 mg by mouth daily.    [provider]  pantoprazole (PROTONIX) 40 MG tablet Take 40 mg by mouth daily.    [provider]  potassium citrate (UROCIT-K) 10 MEQ (1080 MG) SR tablet Take 10 mEq by mouth 2 (two) times daily.    [provider]  tamsulosin (FLOMAX) 0.4 MG CAPS capsule Take 0.4 mg by mouth 2 (two) times daily.    [provider]  traZODone (DESYREL) 150 MG tablet Take 75 mg by mouth at bedtime.    [provider]  vitamin B-12 (CYANOCOBALAMIN) 500 MCG tablet Take 500 mcg by mouth every other day.    [provider]    Scheduled Meds: . etomidate      . octreotide  50 mcg Intravenous Once  . [START ON 05/02/2020] pantoprazole  40 mg Intravenous Q12H  . rocuronium bromide      . succinylcholine       Infusions: . sodium chloride    . sodium chloride    . cefTRIAXone (ROCEPHIN)  IV    . octreotide  (SANDOSTATIN)    IV infusion    . pantoprozole (PROTONIX) infusion     PRN Meds:    Allergies as of 04/29/2020 - Review Complete 04/26/2020  Allergen Reaction Noted  . Lisinopril Cough 07/13/2014    Family History  Problem Relation Age of Onset  . Liver cancer Mother   . Colon cancer Neg Hx   . Stomach cancer Neg Hx     Social History   Socioeconomic History  . Marital status:  Married    Spouse name: Not on file  . Number of children: Not on file  . Years of education: Not on file  . Highest education level: Not on file  Occupational History  . Occupation: retired  Tobacco Use  . Smoking status: Never Smoker  . Smokeless tobacco: Never Used  Vaping Use  . Vaping Use: Never used  Substance and Sexual Activity  . Alcohol use: Not Currently  . Drug use: Not Currently  . Sexual activity: Not on file  Other Topics Concern  . Not on file  Social History Narrative  . Not on file   Social Determinants of Health   Financial Resource Strain: Not on file  Food Insecurity: Not on file  Transportation Needs: Not on file  Physical Activity: Not on file  Stress: Not on file  Social Connections: Not on file  Intimate Partner Violence: Not on file    REVIEW OF SYSTEMS: Constitutional:  weakness ENT:  No nose bleeds Pulm:  No SOB or cough.   CV:  No palpitations, no LE edema.  GU:  No hematuria, no frequency GI:  See HPI Heme: see HPI.     Transfusions:  See HPI Neuro:  No headaches, no peripheral tingling or numbness Derm:  No itching, no rash or sores.  Endocrine:  No sweats or chills.  No polyuria or dysuria Immunization:  Got vax for Covid 19 Travel:  None beyond local counties in last few months.    PHYSICAL EXAM: Vital signs in last 24 hours: Vitals:   04/29/20 0745 04/29/20 0800  BP: (!) 79/50 (!) 84/56  Pulse: 68 (!) 44  Resp: 20 19  SpO2:  100% 99%   Wt Readings from Last 3 Encounters:  04/25/20 77.1 kg  04/17/20 77.1 kg  04/02/20 85.7 kg    General: pale, ill, alert, comfortable Head:  Bruising and shallow abrasion at R eye  Eyes:  No icterus.  + conj pallor.   Ears:  Not HOH  Nose:  No discharge.  [place] oxygen apparatus in place Mouth:  Rusty stripe of dried blood on tongue.  Mucosa is dry Neck: No JVD, no masses no thyromegaly. Lungs: No labored breathing or cough. Heart: RRR.  No MRG.  S1, S2 present Abdomen: Obese, soft.  No  tenderness.  Active bowel sounds.  No bruising.  No hernias..   Rectal: Deferred Musc/Skeltl: Joint redness, swelling or gross deformity. Extremities: No CCE. Neurologic: Alert.  Oriented to self, place, situation, year.  No tremors.  Moves all 4 limbs, strength not tested. Skin: Pale.  No telangiectasia Nodes: No cervical adenopathy Psych: Calm, cooperative, pleasant.  Intake/Output from previous day: No intake/output data recorded. Intake/Output this shift: No intake/output data recorded.  LAB RESULTS: Recent Labs    04/26/20 0900 04/26/20 1158 04/26/20 1932 04/27/20 0512 04/29/20 0629  WBC 2.5*  --   --   --   --   HGB 7.8*   < > 7.9* 7.8* 5.1*  HCT 23.5*   < > 24.2* 23.8* 15.0*  PLT 63*  --   --   --   --    < > = values in this interval not displayed.   BMET Lab Results  Component Value Date   NA 135 04/29/2020   NA 136 04/27/2020   NA 134 (L) 04/25/2020   K 3.9 04/29/2020   K 4.4 04/27/2020   K 5.5 (H) 04/25/2020   CL 102 04/29/2020   CL 102 04/27/2020   CL 100 04/25/2020   CO2 28 04/27/2020   CO2 26 04/25/2020   CO2 29 01/28/2020   GLUCOSE 479 (H) 04/29/2020   GLUCOSE 159 (H) 04/27/2020   GLUCOSE 374 (H) 04/25/2020   BUN 33 (H) 04/29/2020   BUN 26 (H) 04/27/2020   BUN 17 04/25/2020   CREATININE 2.10 (H) 04/29/2020   CREATININE 1.85 (H) 04/27/2020   CREATININE 2.12 (H) 04/25/2020   CALCIUM 8.4 (L) 04/27/2020   CALCIUM 8.6 (L) 04/25/2020   CALCIUM 9.4 01/28/2020   LFT Recent Labs    04/27/20 0512  PROT 6.2*  ALBUMIN 2.8*  AST 20  ALT 15  ALKPHOS 52  BILITOT 0.5   PT/INR Lab Results  Component Value Date   INR 1.2 (H) 01/28/2020   Hepatitis Panel No results for input(s): HEPBSAG, HCVAB, HEPAIGM, HEPBIGM in the last 72 hours. C-Diff No components found for: CDIFF Lipase     Component Value Date/Time   LIPASE 29 04/25/2020 1811    Drugs of Abuse  No results found for: LABOPIA, COCAINSCRNUR, LABBENZ, AMPHETMU, THCU, LABBARB    RADIOLOGY STUDIES: DG Chest Portable 1 View  Result Date: 04/29/2020 CLINICAL DATA:  Line placement EXAM: PORTABLE CHEST 1 VIEW COMPARISON:  April 26, 2020 FINDINGS: The cardiomediastinal silhouette is unchanged and mildly enlarged in contour.LEFT IJ CVC tip terminates over the expected region of the LEFT brachiocephalic vein confluence with the subclavian. Small LEFT pleural effusion, unchanged. No pneumothorax. LEFT retrocardiac opacity, possibly atelectasis. Visualized abdomen is unremarkable. No acute osseous abnormality. IMPRESSION: LEFT IJ CVC tip terminates over the expected region of the LEFT brachiocephalic vein confluence with the subclavian. No pneumothorax. Electronically Signed   By:  Valentino Saxon MD   On: 04/29/2020 07:54    IMPRESSION:   *.     Recurrent hematemesis in patient who underwent 11 band esophageal variceal ligation on 04/17/2020.  Hematemesis with repeat 04/25/2020 EGD showing ulcers at the site of the band placement but no active bleeding and no interventions performed. Suspect ongoing bleeding from post EVLT ulcers.  *     ABL anemia.  Did not require blood during admission this past weekend.  Apparently received 4 PRBCs at [facility] prior to transfer but Hb now 5.1 so needs additional blood. Octreotide and Protonix drip ordered but due to IV access issues have not been initiated. Did receive Rocephin.  *    Thrombocytopenia.  Platelets 63K.  *    Coagulopathy.  INR 1.7. Elevated D-dimer, 17.3, was 1.72 days ago.  *    COVID-19 positive.  Small pleural effusion on x-ray 1/29 again this morning.  Did not require steroids, remdesivir.  PLAN:     *   Repeat EGD today.  However at this point needs functional IV access.  CCM is about to replace central line so that he can start the octreotide, Protonix, IV fluids, blood products including PRBCs, FFP, platelets (massive transfusion protocol initiated by CCM).    Azucena Freed  04/29/2020, 8:25  AM Phone [phone]

## 2020-04-29 NOTE — Anesthesia Procedure Notes (Signed)
Arterial Line Insertion Start/End2/03/2020 3:10 PM, 04/29/2020 3:15 PM Performed by: CRNA  Patient location: OR. Preanesthetic checklist: patient identified, IV checked, site marked, risks and benefits discussed, surgical consent, monitors and equipment checked, pre-op evaluation, timeout performed and anesthesia consent Left, radial was placed Catheter size: 20 G Hand hygiene performed  and maximum sterile barriers used   Attempts: 1 Procedure performed without using ultrasound guided technique. Following insertion, Biopatch and dressing applied. Post procedure assessment: normal  Patient tolerated the procedure well with no immediate complications.

## 2020-04-29 NOTE — Telephone Encounter (Signed)
Patient was transported to the ED earlier this morning, Dr. Havery Moros has been notified, see phone note from 04/29/20 for more information.

## 2020-04-29 NOTE — Progress Notes (Signed)
Mr Gorden Harms [place] in to consult with patient lower legs

## 2020-04-29 NOTE — H&P (View-Only) (Signed)
[facility] Consult: 8:25 AM 04/29/2020  LOS: 0 days    Referring Provider: Dr Tacy Learn  Primary Care Physician:  [name], MD at the [facility]. Primary Gastroenterologist:  Dr Havery Moros    Reason for Consultation:  Hematemesis/melena.  hax esoph variceal banding.  NASH cirrhosis.     HPI: Corey Wood is a 70 y.o. male.  PMH CKD 3.  NASH cirrhosis.  COPD.  Diabetes.  Gallstone pancreatitis July 2021.  Cholecystectomy.  Colon polyps.  Myelodysplastic syndrome.  OSA.  Patient reported parkinsonism.  Hernia [place].  IDA. During admission with gallstone pancreatitis in July that started at [facility] and ended up transferred to [facility], CT showed portal hypertension, esophageal varices, esophageal wall thickening, cirrhosis. Process was attributed to NASH, very minimal alcohol intake of about 2 alcoholic beverages per year.  IgG is 2562 in early November.  04/17/2020 EGD with 11 bands placed to large esophageal varices.  Type II (GOV2) gastric varices.  Antral erythema.  Normal examined duodenum.  Presented to the hospital over the weekend with hematemesis.  Hgb went from 10.3-8.6 while in the ED.  He had incidental finding of COVID-19 positivity. 04/25/2020 EGD by Dr. Benson Norway showed persistent large esophageal varices and esophageal ulcers without stigmata or active bleeding.  Portal hypertensive gastropathy, some food residue in the stomach, unremarkable duodenum.  No interventions performed.  Leading was attributed from one of the ulcers at site of prior banding.   Treated with ceftriaxone, octreotide, Protonix.  Hb 6.9 >> 7.8.  Platelets 63K. Apparently got PRBCs prior to arrival.   WBCs 2.5.  Dr. Benson Norway noted if rebleeding recurs, repeat EGD with banding but otherwise best to monitor patient and maintain  Hgb between 8-10 not higher to keep the portal pressures lower. Was discharged within a couple of days.  Was to restart low-dose aspirin on 2/4.  Cipro 500 bid for 5 days (SBP prophylaxis).  Iron 3 times weekly.  Protonix 40/day  Early this AM pt developed recurrent hematemesis, melena, altered mental status. Syncope?, fell at home. EMS approximated 1 L of bloody emesis during transport and patient arrived hypotensive but has responded to IV fluid resuscitation. Notes mention he received 4 PRBCs, FFP PTA through [facility] ED.   Ordered but not yet initiated are transfusion with 2 PRBCs.  Central line placed but is currently non-functional so PPI and octreotide, have yet to get started.  Rocephin received.  Intra osseus line placed to LLE.  Current BPs 68/44.  HR 60s.  sats in 90s.  Not intubated.    Hgb 5.1.  No coags. AKI, GFR 38.  Normal LFTs.  Elevated lactate.     Past Medical History:  Diagnosis Date  . Allergy    seasonal allergies  . Anxiety    on meds  . Aortic valve disorder   . Arthritis    generalized (fingers)(shoulders)  . Asthma    uses inhaler  . Cataract    bilateral -sx   . Cirrhosis ([place])   . CKD (chronic kidney disease), stage III ([place])  only has one kidney  . COPD (chronic obstructive pulmonary disease) ([place])   . Depression    on meds  . DM (diabetes mellitus) ([place])    on meds  . GERD (gastroesophageal reflux disease)    on meds  . Heart murmur   . History of colon polyps   . Hx of acute pancreatitis 10/2019  . Hyperlipidemia    on meds  . Hypertension    on meds  . Hypothyroidism    not on meds at this time  . Myelodysplastic syndrome ([place])   . Neuromuscular disorder ([place])    per pt  . Obstructive sleep apnea   . Parkinsonism ([place])    per pt report  . Peripheral positional vertigo     Past Surgical History:  Procedure Laterality Date  . ANKLE SURGERY    . CHOLECYSTECTOMY    . ESOPHAGEAL BANDING N/A 04/17/2020   Procedure: ESOPHAGEAL  BANDING;  Surgeon: Yetta Flock, MD;  Location: [facility];  Service: Gastroenterology;  Laterality: N/A;  . ESOPHAGOGASTRODUODENOSCOPY (EGD) WITH PROPOFOL N/A 04/17/2020   Procedure: ESOPHAGOGASTRODUODENOSCOPY (EGD) WITH PROPOFOL;  Surgeon: Yetta Flock, MD;  Location: [facility];  Service: Gastroenterology;  Laterality: N/A;  . EYE SURGERY    . HERNIA REPAIR    . KIDNEY STONE SURGERY    . WISDOM TOOTH EXTRACTION      Prior to Admission medications   Medication Sig Start Date End Date Taking? Authorizing Provider  albuterol (VENTOLIN HFA) 108 (90 Base) MCG/ACT inhaler Inhale 2 puffs into the lungs daily as needed for wheezing or shortness of breath.    [provider]  ARTIFICIAL SALIVA MT Use as directed 4 sprays in the mouth or throat at bedtime.    [provider]  aspirin 81 MG chewable tablet Chew 1 tablet (81 mg total) by mouth daily. 05/02/20   Pokhrel, Corrie Mckusick, MD  atorvastatin (LIPITOR) 20 MG tablet Take 20 mg by mouth at bedtime.    [provider]  buPROPion (WELLBUTRIN XL) 150 MG 24 hr tablet Take 150 mg by mouth daily.    [provider]  cetirizine (ZYRTEC) 10 MG chewable tablet Chew 10 mg by mouth at bedtime.    [provider]  ciprofloxacin (CIPRO) 500 MG tablet Take 1 tablet (500 mg total) by mouth 2 (two) times daily for 5 days. 04/27/20 05/02/20  Pokhrel, Corrie Mckusick, MD  diclofenac Sodium (VOLTAREN) 1 % GEL Apply 2 g topically 2 (two) times daily as needed (pain).    [provider]  ferrous sulfate 325 (65 FE) MG tablet Take 325 mg by mouth every Monday, Wednesday, and Friday.    [provider]  fluticasone (FLONASE) 50 MCG/ACT nasal spray Place 2 sprays into both nostrils daily.    [provider]  glucose 4 GM chewable tablet Chew 1 tablet by mouth daily as needed for low blood sugar (If drop below 70).    [provider]  HM LIDOCAINE PATCH EX Apply 1 patch topically every 12  (twelve) hours as needed (Pain). 5%    [provider]  insulin glargine (LANTUS) 100 unit/mL SOPN Inject 12 Units into the skin daily.    [provider]  ipratropium (ATROVENT) 0.03 % nasal spray Place 2 sprays into both nostrils at bedtime.    [provider]  ketotifen (ZADITOR) 0.025 % ophthalmic solution Place 1 drop into both eyes at bedtime.    [provider]  losartan (COZAAR) 100 MG tablet  Take 100 mg by mouth at bedtime.    [provider]  meclizine (ANTIVERT) 25 MG tablet Take 25 mg by mouth 2 (two) times daily.    [provider]  montelukast (SINGULAIR) 10 MG tablet Take 10 mg by mouth daily.    [provider]  pantoprazole (PROTONIX) 40 MG tablet Take 40 mg by mouth daily.    [provider]  potassium citrate (UROCIT-K) 10 MEQ (1080 MG) SR tablet Take 10 mEq by mouth 2 (two) times daily.    [provider]  tamsulosin (FLOMAX) 0.4 MG CAPS capsule Take 0.4 mg by mouth 2 (two) times daily.    [provider]  traZODone (DESYREL) 150 MG tablet Take 75 mg by mouth at bedtime.    [provider]  vitamin B-12 (CYANOCOBALAMIN) 500 MCG tablet Take 500 mcg by mouth every other day.    [provider]    Scheduled Meds: . etomidate      . octreotide  50 mcg Intravenous Once  . [START ON 05/02/2020] pantoprazole  40 mg Intravenous Q12H  . rocuronium bromide      . succinylcholine       Infusions: . sodium chloride    . sodium chloride    . cefTRIAXone (ROCEPHIN)  IV    . octreotide  (SANDOSTATIN)    IV infusion    . pantoprozole (PROTONIX) infusion     PRN Meds:    Allergies as of 04/29/2020 - Review Complete 04/26/2020  Allergen Reaction Noted  . Lisinopril Cough 07/13/2014    Family History  Problem Relation Age of Onset  . Liver cancer Mother   . Colon cancer Neg Hx   . Stomach cancer Neg Hx     Social History   Socioeconomic History  . Marital status:  Married    Spouse name: Not on file  . Number of children: Not on file  . Years of education: Not on file  . Highest education level: Not on file  Occupational History  . Occupation: retired  Tobacco Use  . Smoking status: Never Smoker  . Smokeless tobacco: Never Used  Vaping Use  . Vaping Use: Never used  Substance and Sexual Activity  . Alcohol use: Not Currently  . Drug use: Not Currently  . Sexual activity: Not on file  Other Topics Concern  . Not on file  Social History Narrative  . Not on file   Social Determinants of Health   Financial Resource Strain: Not on file  Food Insecurity: Not on file  Transportation Needs: Not on file  Physical Activity: Not on file  Stress: Not on file  Social Connections: Not on file  Intimate Partner Violence: Not on file    REVIEW OF SYSTEMS: Constitutional:  weakness ENT:  No nose bleeds Pulm:  No SOB or cough.   CV:  No palpitations, no LE edema.  GU:  No hematuria, no frequency GI:  See HPI Heme: see HPI.     Transfusions:  See HPI Neuro:  No headaches, no peripheral tingling or numbness Derm:  No itching, no rash or sores.  Endocrine:  No sweats or chills.  No polyuria or dysuria Immunization:  Got vax for Covid 19 Travel:  None beyond local counties in last few months.    PHYSICAL EXAM: Vital signs in last 24 hours: Vitals:   04/29/20 0745 04/29/20 0800  BP: (!) 79/50 (!) 84/56  Pulse: 68 (!) 44  Resp: 20 19  SpO2:  100% 99%   Wt Readings from Last 3 Encounters:  04/25/20 77.1 kg  04/17/20 77.1 kg  04/02/20 85.7 kg    General: pale, ill, alert, comfortable Head:  Bruising and shallow abrasion at R eye  Eyes:  No icterus.  + conj pallor.   Ears:  Not HOH  Nose:  No discharge.  [place] oxygen apparatus in place Mouth:  Rusty stripe of dried blood on tongue.  Mucosa is dry Neck: No JVD, no masses no thyromegaly. Lungs: No labored breathing or cough. Heart: RRR.  No MRG.  S1, S2 present Abdomen: Obese, soft.  No  tenderness.  Active bowel sounds.  No bruising.  No hernias..   Rectal: Deferred Musc/Skeltl: Joint redness, swelling or gross deformity. Extremities: No CCE. Neurologic: Alert.  Oriented to self, place, situation, year.  No tremors.  Moves all 4 limbs, strength not tested. Skin: Pale.  No telangiectasia Nodes: No cervical adenopathy Psych: Calm, cooperative, pleasant.  Intake/Output from previous day: No intake/output data recorded. Intake/Output this shift: No intake/output data recorded.  LAB RESULTS: Recent Labs    04/26/20 0900 04/26/20 1158 04/26/20 1932 04/27/20 0512 04/29/20 0629  WBC 2.5*  --   --   --   --   HGB 7.8*   < > 7.9* 7.8* 5.1*  HCT 23.5*   < > 24.2* 23.8* 15.0*  PLT 63*  --   --   --   --    < > = values in this interval not displayed.   BMET Lab Results  Component Value Date   NA 135 04/29/2020   NA 136 04/27/2020   NA 134 (L) 04/25/2020   K 3.9 04/29/2020   K 4.4 04/27/2020   K 5.5 (H) 04/25/2020   CL 102 04/29/2020   CL 102 04/27/2020   CL 100 04/25/2020   CO2 28 04/27/2020   CO2 26 04/25/2020   CO2 29 01/28/2020   GLUCOSE 479 (H) 04/29/2020   GLUCOSE 159 (H) 04/27/2020   GLUCOSE 374 (H) 04/25/2020   BUN 33 (H) 04/29/2020   BUN 26 (H) 04/27/2020   BUN 17 04/25/2020   CREATININE 2.10 (H) 04/29/2020   CREATININE 1.85 (H) 04/27/2020   CREATININE 2.12 (H) 04/25/2020   CALCIUM 8.4 (L) 04/27/2020   CALCIUM 8.6 (L) 04/25/2020   CALCIUM 9.4 01/28/2020   LFT Recent Labs    04/27/20 0512  PROT 6.2*  ALBUMIN 2.8*  AST 20  ALT 15  ALKPHOS 52  BILITOT 0.5   PT/INR Lab Results  Component Value Date   INR 1.2 (H) 01/28/2020   Hepatitis Panel No results for input(s): HEPBSAG, HCVAB, HEPAIGM, HEPBIGM in the last 72 hours. C-Diff No components found for: CDIFF Lipase     Component Value Date/Time   LIPASE 29 04/25/2020 1811    Drugs of Abuse  No results found for: LABOPIA, COCAINSCRNUR, LABBENZ, AMPHETMU, THCU, LABBARB    RADIOLOGY STUDIES: DG Chest Portable 1 View  Result Date: 04/29/2020 CLINICAL DATA:  Line placement EXAM: PORTABLE CHEST 1 VIEW COMPARISON:  April 26, 2020 FINDINGS: The cardiomediastinal silhouette is unchanged and mildly enlarged in contour.LEFT IJ CVC tip terminates over the expected region of the LEFT brachiocephalic vein confluence with the subclavian. Small LEFT pleural effusion, unchanged. No pneumothorax. LEFT retrocardiac opacity, possibly atelectasis. Visualized abdomen is unremarkable. No acute osseous abnormality. IMPRESSION: LEFT IJ CVC tip terminates over the expected region of the LEFT brachiocephalic vein confluence with the subclavian. No pneumothorax. Electronically Signed   By:  Valentino Saxon MD   On: 04/29/2020 07:54    IMPRESSION:   *.     Recurrent hematemesis in patient who underwent 11 band esophageal variceal ligation on 04/17/2020.  Hematemesis with repeat 04/25/2020 EGD showing ulcers at the site of the band placement but no active bleeding and no interventions performed. Suspect ongoing bleeding from post EVLT ulcers.  *     ABL anemia.  Did not require blood during admission this past weekend.  Apparently received 4 PRBCs at [facility] prior to transfer but Hb now 5.1 so needs additional blood. Octreotide and Protonix drip ordered but due to IV access issues have not been initiated. Did receive Rocephin.  *    Thrombocytopenia.  Platelets 63K.  *    Coagulopathy.  INR 1.7. Elevated D-dimer, 17.3, was 1.72 days ago.  *    COVID-19 positive.  Small pleural effusion on x-ray 1/29 again this morning.  Did not require steroids, remdesivir.  PLAN:     *   Repeat EGD today.  However at this point needs functional IV access.  CCM is about to replace central line so that he can start the octreotide, Protonix, IV fluids, blood products including PRBCs, FFP, platelets (massive transfusion protocol initiated by CCM).    Azucena Freed  04/29/2020, 8:25  AM Phone [phone]

## 2020-04-29 NOTE — Anesthesia Postprocedure Evaluation (Signed)
Anesthesia Post Note  Patient: Corey Wood  Procedure(s) Performed: CAROTID EXPLORATION removal of  central venous catheter (Left Neck)     Patient location during evaluation: ICU Anesthesia Type: General Level of consciousness: patient remains intubated per anesthesia plan Pain management: pain level controlled Vital Signs Assessment: post-procedure vital signs reviewed and stable Respiratory status: patient remains intubated per anesthesia plan Cardiovascular status: stable Postop Assessment: no apparent nausea or vomiting Anesthetic complications: no   No complications documented.  Last Vitals:  Vitals:   04/29/20 1745 04/29/20 1800  BP: (!) 89/52 (!) 89/55  Pulse: 60 (!) 59  Resp: 20 20  Temp:    SpO2: 100% 100%    Last Pain:  Vitals:   04/29/20 1310  TempSrc: Axillary                 Imraan Wendell

## 2020-04-29 NOTE — Brief Op Note (Signed)
Brief EGD procedure note. Provation note will be completed once images have been transferred later this evening.  EGD performed in medical ICU in setting of patient being under sedation. Proximal esophagus normal. Middle and distal esophagus with evidence of multiple columns of varices as well as evidence of post banding ulcers.  Varices noted to be grade 1/2/3.  One post banding ulcer had evidence of active oozing. After the completion of the rest of the EGD, 6 bands were placed with general deflation of majority of columns.  No evidence of active extravasation was noted. Stomach full of blood clot and red blood.  Suction and performed super suction with minimal ability to clear stomach completely.  Proximal views of OG V did not show evidence of active bleeding or extravasation. Duodenum with old blood.  Continue IV Protonix drip for 72 hours. Continue octreotide drip for 72 hours. Ceftriaxone 2 g on 2/2 and continue for 5-day course.  Patient may have evidence of melena and anemia as a result of equilibration in the next 24 hours. If hemodynamic compromise occurs please alert the [facility] team.  However, most likely patient will need a salvage TIPS if issues recur. Recommend obtaining cross-sectional imaging and TTE within the next 24 to 48 hours to get optimal imaging in case this were to occur.  Inpatient [facility] team will continue to follow.   Justice Britain, MD [facility] Advanced Endoscopy Office # [phone]

## 2020-04-29 NOTE — Interval H&P Note (Signed)
History and Physical Interval Note:  04/29/2020 3:38 PM  Corey Wood  has presented today for surgery, with the diagnosis of Upper endoscopy.  The various methods of treatment have been discussed with the patient and family. After consideration of risks, benefits and other options for treatment, the patient has consented to  Procedure(s): ESOPHAGOGASTRODUODENOSCOPY (EGD) WITH PROPOFOL (N/A) as a surgical intervention.  The patient's history has been reviewed, patient examined, no change in status, stable for surgery.  I have reviewed the patient's chart and labs.  Questions were answered to the patient's satisfaction.     Extensive discussion with the patient's wife as well over the phone and she understands the implications of the procedure as well as complications that could lead to need for urgent TIPS if unsuccessful.   [employer]

## 2020-04-29 NOTE — ED Notes (Signed)
Report given to [employer]. Pt has 233mL of bright red in suction canister. RN aware of needing placement of central line.

## 2020-04-29 NOTE — Progress Notes (Addendum)
   Wife, Geran Haithcock, [phone] called by phone and updated.   Left foot XR ordered to evaluate left 3/4 toes for laceration and suspected fx; called and spoke with ortho.  Appreciate recommendations.   Some concern if left IJ is arterial placed.  Unable to pull back to check ABG.  Given his ongoing hypotension and now with vasopressor requirement, NE at 10 mcg/min, a R IJ CVL was placed with difficulty- he is very intravascular dry, vein collapses with respirations.  He is still receiving his FFP/ platelets from [facility].   Dr. Tacy Learn will discuss with vascular prior to removing given possible arterial placement with coagulopathy.   No further bleeding thus far; patient remains alert and mostly oriented.    Kennieth Rad, ACNP [facility] 04/29/2020, 11:22 AM

## 2020-04-30 ENCOUNTER — Encounter (HOSPITAL_COMMUNITY): Payer: Self-pay | Admitting: Vascular Surgery

## 2020-04-30 ENCOUNTER — Other Ambulatory Visit (HOSPITAL_COMMUNITY): Payer: No Typology Code available for payment source

## 2020-04-30 DIAGNOSIS — K922 Gastrointestinal hemorrhage, unspecified: Secondary | ICD-10-CM | POA: Diagnosis not present

## 2020-04-30 DIAGNOSIS — K7290 Hepatic failure, unspecified without coma: Secondary | ICD-10-CM

## 2020-04-30 DIAGNOSIS — K920 Hematemesis: Secondary | ICD-10-CM | POA: Diagnosis not present

## 2020-04-30 DIAGNOSIS — R933 Abnormal findings on diagnostic imaging of other parts of digestive tract: Secondary | ICD-10-CM

## 2020-04-30 LAB — CBC
HCT: 20.7 % — ABNORMAL LOW (ref 39.0–52.0)
Hemoglobin: 7.5 g/dL — ABNORMAL LOW (ref 13.0–17.0)
MCH: 32.2 pg (ref 26.0–34.0)
MCHC: 36.2 g/dL — ABNORMAL HIGH (ref 30.0–36.0)
MCV: 88.8 fL (ref 80.0–100.0)
Platelets: 33 10*3/uL — ABNORMAL LOW (ref 150–400)
RBC: 2.33 MIL/uL — ABNORMAL LOW (ref 4.22–5.81)
RDW: 15.2 % (ref 11.5–15.5)
WBC: 4.1 10*3/uL (ref 4.0–10.5)
nRBC: 0.5 % — ABNORMAL HIGH (ref 0.0–0.2)

## 2020-04-30 LAB — PREPARE PLATELET PHERESIS: Unit division: 0

## 2020-04-30 LAB — PREPARE FRESH FROZEN PLASMA: Unit division: 0

## 2020-04-30 LAB — GLUCOSE, CAPILLARY
Glucose-Capillary: 257 mg/dL — ABNORMAL HIGH (ref 70–99)
Glucose-Capillary: 263 mg/dL — ABNORMAL HIGH (ref 70–99)
Glucose-Capillary: 271 mg/dL — ABNORMAL HIGH (ref 70–99)
Glucose-Capillary: 190 mg/dL — ABNORMAL HIGH (ref 70–99)
Glucose-Capillary: 202 mg/dL — ABNORMAL HIGH (ref 70–99)
Glucose-Capillary: 184 mg/dL — ABNORMAL HIGH (ref 70–99)

## 2020-04-30 LAB — COMPREHENSIVE METABOLIC PANEL
ALT: 77 U/L — ABNORMAL HIGH (ref 0–44)
AST: 88 U/L — ABNORMAL HIGH (ref 15–41)
Albumin: 2.3 g/dL — ABNORMAL LOW (ref 3.5–5.0)
Alkaline Phosphatase: 48 U/L (ref 38–126)
Anion gap: 12 (ref 5–15)
BUN: 34 mg/dL — ABNORMAL HIGH (ref 8–23)
CO2: 20 mmol/L — ABNORMAL LOW (ref 22–32)
Calcium: 7.7 mg/dL — ABNORMAL LOW (ref 8.9–10.3)
Chloride: 104 mmol/L (ref 98–111)
Creatinine, Ser: 2.14 mg/dL — ABNORMAL HIGH (ref 0.61–1.24)
GFR, Estimated: 32 mL/min — ABNORMAL LOW (ref >60)
Glucose, Bld: 287 mg/dL — ABNORMAL HIGH (ref 70–99)
Potassium: 4.1 mmol/L (ref 3.5–5.1)
Sodium: 136 mmol/L (ref 135–145)
Total Bilirubin: 1 mg/dL (ref 0.3–1.2)
Total Protein: 4.9 g/dL — ABNORMAL LOW (ref 6.5–8.1)

## 2020-04-30 LAB — BPAM FFP
Blood Product Expiration Date: 202202052359
Blood Product Expiration Date: 202202052359
ISSUE DATE / TIME: 202202011013
ISSUE DATE / TIME: 202202011013
Unit Type and Rh: 6200
Unit Type and Rh: 6200

## 2020-04-30 LAB — HEMOGLOBIN AND HEMATOCRIT, BLOOD
HCT: 21.3 % — ABNORMAL LOW (ref 39.0–52.0)
HCT: 22.7 % — ABNORMAL LOW (ref 39.0–52.0)
HCT: 21.6 % — ABNORMAL LOW (ref 39.0–52.0)
Hemoglobin: 7.7 g/dL — ABNORMAL LOW (ref 13.0–17.0)
Hemoglobin: 7.5 g/dL — ABNORMAL LOW (ref 13.0–17.0)
Hemoglobin: 7.3 g/dL — ABNORMAL LOW (ref 13.0–17.0)

## 2020-04-30 LAB — PROTIME-INR
INR: 1.5 — ABNORMAL HIGH (ref 0.8–1.2)
Prothrombin Time: 17.3 seconds — ABNORMAL HIGH (ref 11.4–15.2)

## 2020-04-30 LAB — BPAM PLATELET PHERESIS
Blood Product Expiration Date: 202202022359
ISSUE DATE / TIME: 202202011013
Unit Type and Rh: 5100

## 2020-04-30 LAB — TRIGLYCERIDES: Triglycerides: 289 mg/dL — ABNORMAL HIGH (ref <150)

## 2020-04-30 MED ORDER — SODIUM CHLORIDE 0.9% FLUSH
10.0000 mL | Freq: Two times a day (BID) | INTRAVENOUS | Status: DC
  Administered 2020-04-30 – 2020-05-05 (×8): 10 mL

## 2020-04-30 MED ORDER — INSULIN ASPART 100 UNIT/ML ~~LOC~~ SOLN
0.0000 [IU] | SUBCUTANEOUS | Status: DC
  Administered 2020-04-30 (×2): 11 [IU] via SUBCUTANEOUS
  Administered 2020-04-30: 7 [IU] via SUBCUTANEOUS
  Administered 2020-04-30 – 2020-05-01 (×3): 4 [IU] via SUBCUTANEOUS
  Administered 2020-05-01: 15 [IU] via SUBCUTANEOUS
  Administered 2020-05-01: 11 [IU] via SUBCUTANEOUS
  Administered 2020-05-01 (×2): 7 [IU] via SUBCUTANEOUS
  Administered 2020-05-01: 11 [IU] via SUBCUTANEOUS
  Administered 2020-05-02: 20 [IU] via SUBCUTANEOUS
  Administered 2020-05-02 (×2): 7 [IU] via SUBCUTANEOUS
  Administered 2020-05-02: 15 [IU] via SUBCUTANEOUS
  Administered 2020-05-03 (×2): 7 [IU] via SUBCUTANEOUS
  Administered 2020-05-03: 4 [IU] via SUBCUTANEOUS
  Administered 2020-05-03: 3 [IU] via SUBCUTANEOUS
  Administered 2020-05-03 (×2): 11 [IU] via SUBCUTANEOUS
  Administered 2020-05-04: 4 [IU] via SUBCUTANEOUS
  Administered 2020-05-04: 3 [IU] via SUBCUTANEOUS
  Administered 2020-05-04: 11 [IU] via SUBCUTANEOUS
  Administered 2020-05-04: 15 [IU] via SUBCUTANEOUS
  Administered 2020-05-04: 11 [IU] via SUBCUTANEOUS
  Administered 2020-05-04: 3 [IU] via SUBCUTANEOUS
  Administered 2020-05-05: 4 [IU] via SUBCUTANEOUS
  Administered 2020-05-05: 3 [IU] via SUBCUTANEOUS
  Administered 2020-05-05: 4 [IU] via SUBCUTANEOUS
  Administered 2020-05-05: 3 [IU] via SUBCUTANEOUS

## 2020-04-30 MED ORDER — SUCRALFATE 1 GM/10ML PO SUSP: 1.0000 g | Freq: Four times a day (QID) | ORAL | Status: DC

## 2020-04-30 MED ORDER — SODIUM CHLORIDE 0.9 % IV SOLN
510.0000 mg | INTRAVENOUS | Status: DC
  Administered 2020-04-30: 510 mg via INTRAVENOUS
  Filled 2020-04-30: qty 17

## 2020-04-30 MED ORDER — SODIUM CHLORIDE 0.9 % IV SOLN
1.0000 g | INTRAVENOUS | Status: AC
  Administered 2020-04-30 – 2020-05-05 (×6): 1 g via INTRAVENOUS
  Filled 2020-04-30 (×2): qty 1
  Filled 2020-04-30: qty 10
  Filled 2020-04-30: qty 1
  Filled 2020-04-30 (×2): qty 10

## 2020-04-30 MED ORDER — SODIUM CHLORIDE 0.9% FLUSH: 10.0000 mL | INTRAVENOUS | Status: DC | PRN

## 2020-04-30 MED ORDER — SUCRALFATE 1 GM/10ML PO SUSP
1.0000 g | Freq: Four times a day (QID) | ORAL | Status: DC
  Administered 2020-04-30 – 2020-05-05 (×19): 1 g via ORAL
  Filled 2020-04-30 (×16): qty 10

## 2020-04-30 NOTE — Progress Notes (Signed)
Patient ID: Corey Wood, male   DOB: 06/23/1950, 70 y.o.   MRN: [id number] Remains intubated.  Left neck wound without hematoma. Will not follow actively.  Has subcuticular closure and does not need follow-up with vascular

## 2020-04-30 NOTE — Op Note (Signed)
[facility] Patient Name: Corey Wood Procedure Date : 04/29/2020 MRN: [id number] Attending MD: Justice Britain , MD Date of Birth: May 29, 1950 CSN: [id number] Age: 70 Admit Type: Inpatient Procedure:                Upper GI endoscopy Indications:              Acute post hemorrhagic anemia, Failure to respond                            to medical treatment, Coffee-ground emesis,                            Hematemesis, Melena, Active gastrointestinal                            bleeding, Recent gastrointestinal bleeding,                            Esophageal varices, For therapy of esophageal                            varices, Gastric varices Providers:                Justice Britain, MD, Clyde Lundborg, RN, Tyrone Apple, Technician, Laverda Sorenson, Technician Referring MD:             Carlota Raspberry. Havery Moros, MD, ICU Service, Carol Ada, MD Medicines:                General Anesthesia Complications:            No immediate complications. Estimated Blood Loss:     Estimated blood loss was minimal. Procedure:                Pre-Anesthesia Assessment:                           - Prior to the procedure, a History and Physical                            was performed, and patient medications and                            allergies were reviewed. The patient's tolerance of                            previous anesthesia was also reviewed. The risks                            and benefits of the procedure and the sedation                            options and risks were discussed with  the patient.                            All questions were answered, and informed consent                            was obtained. Prior Anticoagulants: The patient has                            taken no previous anticoagulant or antiplatelet                            agents. ASA Grade Assessment: IV - A patient with                             severe systemic disease that is a constant threat                            to life. After reviewing the risks and benefits,                            the patient was deemed in satisfactory condition to                            undergo the procedure.                           After obtaining informed consent, the endoscope was                            passed under direct vision. Throughout the                            procedure, the patient's blood pressure, pulse, and                            oxygen saturations were monitored continuously. The                            [id number]-[id number] ([id number] Olympus therapeutic                            gastroscope was introduced through the mouth, and                            advanced to the second part of duodenum. The upper                            GI endoscopy was accomplished without difficulty.                            The patient tolerated the procedure. Scope In: Scope Out: Findings:      No gross lesions were noted in the proximal esophagus.      Grade I,  grade II, and grade III varices were found in the mid esophagus       and in the distal esophagus. They were large in size. There was also       evidence of multiple post-banding ulcers. 1 post-banding ulcer had       active oozing occuring. After the completion of the rest of the EGD       attempt, six bands were successfully placed with near complete       eradication, resulting in deflation of varices. Mild oozing noted after       placement of a few of the bands but no active extrvasation noted and the       largest post-banding ulcer with bleeding was completely encompassed       within a new band. Bleeding had stopped at the end of the procedure.      Clotted blood was found in the entire examined stomach. Lavage performed       without clearance. Suction via Endoscope was performed without       clearance. Super suction attempted with moderate success but could  not       clear the fundus and proximal body completely.      Type 2 gastroesophageal varices (GOV2, esophageal varices which extend       along the fundus) were found in the gastric fundus. There were no       stigmata of recent bleeding. They were medium in largest diameter. But I       could not get complete clearance of the fundus to know for sure that       they were not a source of recent bleeding, but there was not active red       blood that was noted during the procedure time that seemed to be       reaccumulating so did not feel it was the active source of bleeding at       this time.      Red blood was found in the duodenal bulb, in the first portion of the       duodenum and in the second portion of the duodenum. Lavage of the area       was performed using copious amounts, resulting in clearance with       adequate visualization and no evidence of another source of active       bleeding. Impression:               - No gross lesions in esophagus proximally.                           - Grade I, grade II and grade III esophageal                            varices as well as multiple post-banding ulcers                            with a few ulcers showing active oozing. Near                            completely eradicated after 6 bands were placed. No  active extravasation noted thereafter.                           - Clotted blood in the entire stomach - suctioned                            and lavaged with mild clearance and did not see                            active re-accumulation of bleeding.                           - Type 2 gastroesophageal varices (GOV2, esophageal                            varices which extend along the fundus) - not                            completely visualized as had been at time of first                            endoscopy but no sing of active bleeding, but                            recent stigmata or nipple sign  could have been                            missed due to blood in fundus.                           - Blood in the duodenal bulb and in the second                            portion of the duodenum - lavaged away. Recommendation:           - The patient will be observed post-procedure,                            until all discharge criteria are met.                           - Return patient to ICU for ongoing care.                           - Clear liquid diet for 2 days once extubated.                           - IV Octreotide for 72 hours.                           - IV Protonix drip for 72 hours then transition to                            PO  PPI BID 40 mg.                           - Carafate 1 g QAC + QHS (or QID while on clear                            liquids) to aid in healing.                           - Ceftriaxone 1-2 g daily x 4-more days.                           - Observe patient's clinical course.                           - Consider an IV Iron infusion due to extensive                            blood loss anemia during last 2-weeks.                           - No aspirin, ibuprofen, naproxen, or other                            non-steroidal anti-inflammatory drugs for 2 weeks.                           - Expect patient to have melena for next 2-4 days                            even in setting of clinical stability. With this                            being said, if hemodynamic compromise is noted to                            maintain with transfusion dependency then will need                            to consider TIPS + BRTO evaluation.                           - Recommend TTE be obtained to evaluate for                            candidacy for potential TIPS. If able to get a                            CT-Scan with contrast that would help IR as well.                            If not then make sure to get a very good Liver  Doppler  U/S to evaluate vasculature.                           - The findings and recommendations were discussed                            with the referring physician. Procedure Code(s):        --- Professional ---                           [phone], Esophagogastroduodenoscopy, flexible,                            transoral; with band ligation of esophageal/gastric                            varices Diagnosis Code(s):        --- Professional ---                           I85.00, Esophageal varices without bleeding                           K92.2, Gastrointestinal hemorrhage, unspecified                           I86.4, Gastric varices                           D62, Acute posthemorrhagic anemia                           K92.0, Hematemesis                           K92.1, Melena (includes Hematochezia) CPT copyright 2019 [employer]. All rights reserved. The codes documented in this report are preliminary and upon coder review may  be revised to meet current compliance requirements. Justice Britain, MD 04/30/2020 2:33:33 AM Number of Addenda: 0

## 2020-04-30 NOTE — Progress Notes (Signed)
  Progress Note    04/30/2020 8:23 AM 1 Day Post-Op  Subjective:  Intubated and sedated   Vitals:   04/30/20 0600 04/30/20 0700  BP: (!) 120/53 (!) 113/53  Pulse: 71 71  Resp: 20 20  Temp:  100.1 F (37.8 C)  SpO2: 99% 99%   Physical Exam: Cardiac:  regular Lungs:  On vent Incisions: Left neck incision is clean, dry and intact. No surrounding swelling or hematoma Extremities:  Well perfused and warm. Left foot dressed Neurologic: sedated  CBC    Component Value Date/Time   WBC 4.1 04/30/2020 0457   RBC 2.33 (L) 04/30/2020 0457   HGB 7.5 (L) 04/30/2020 0457   HCT 20.7 (L) 04/30/2020 0457   PLT 33 (L) 04/30/2020 0457   MCV 88.8 04/30/2020 0457   MCH 32.2 04/30/2020 0457   MCHC 36.2 (H) 04/30/2020 0457   RDW 15.2 04/30/2020 0457   LYMPHSABS 0.4 (L) 04/25/2020 1811   MONOABS 0.2 04/25/2020 1811   EOSABS 0.1 04/25/2020 1811   BASOSABS 0.0 04/25/2020 1811    BMET    Component Value Date/Time   NA 136 04/30/2020 0457   K 4.1 04/30/2020 0457   CL 104 04/30/2020 0457   CO2 20 (L) 04/30/2020 0457   GLUCOSE 287 (H) 04/30/2020 0457   BUN 34 (H) 04/30/2020 0457   CREATININE 2.14 (H) 04/30/2020 0457   CALCIUM 7.7 (L) 04/30/2020 0457   GFRNONAA 32 (L) 04/30/2020 0457    INR    Component Value Date/Time   INR 1.5 (H) 04/30/2020 0457     Intake/Output Summary (Last 24 hours) at 04/30/2020 0100 Last data filed at 04/30/2020 0600 Gross per 24 hour  Intake 2651.05 ml  Output 300 ml  Net 2351.05 ml     Assessment/Plan:  70 y.o. male is s/p left neck exploration and removal of central line under direct vision 1 Day Post-Op. There was no evidence of arterial puncture. Left neck incision is clean, dry and intact without hematoma. Patient remains intubated and sedated. Unable to assess his neurological status at this time due to sedation. Will continue to follow    Karoline Caldwell, PA-C [facility] [phone] 04/30/2020 8:23 AM

## 2020-04-30 NOTE — Progress Notes (Signed)
Inpatient Diabetes Program Recommendations  AACE/ADA: New Consensus Statement on Inpatient Glycemic Control (2015)  Target Ranges:  Prepandial:   less than 140 mg/dL      Peak postprandial:   less than 180 mg/dL (1-2 hours)      Critically ill patients:  140 - 180 mg/dL   Lab Results  Component Value Date   GLUCAP 263 (H) 04/30/2020   HGBA1C 6.6 (H) 04/29/2020    Review of Glycemic Control Results for Corey Wood, Corey Wood (MRN [id number]) as of 04/30/2020 13:26  Ref. Range 04/29/2020 19:29 04/29/2020 23:29 04/30/2020 03:38 04/30/2020 07:52  Glucose-Capillary Latest Ref Range: 70 - 99 mg/dL 233 (H) 297 (H) 257 (H) 263 (H)   Diabetes history: Type 2 DM Outpatient Diabetes medications: Lantus 10 units QD Current orders for Inpatient glycemic control: Novolog 0-20 units Q4H Decadron 5 mg x 1 Inpatient Diabetes Program Recommendations:    Consider adding Lantus 10 units QD (patient's home dose).   Thanks, Bronson Curb, MSN, RNC-OB Diabetes Coordinator [phone] (8a-5p)

## 2020-04-30 NOTE — Progress Notes (Signed)
Daily Rounding Note  04/30/2020, 1:17 PM  LOS: 1 day   SUBJECTIVE:   Chief complaint: Esophageal ulcer bleeding.  Blood loss  Off vent now 2 black stools on current shift  OBJECTIVE:         Vital signs in last 24 hours:    Temp:  [98 F (36.7 C)-100.1 F (37.8 C)] 98 F (36.7 C) (02/02 1200) Pulse Rate:  [58-102] 102 (02/02 1300) Resp:  [16-29] 26 (02/02 1300) BP: (80-142)/(44-72) 129/59 (02/02 1300) SpO2:  [95 %-100 %] 96 % (02/02 1300) Arterial Line BP: (108-189)/(43-70) 109/60 (02/02 1300) FiO2 (%):  [40 %-100 %] 40 % (02/02 0740) Weight:  [88.4 kg] 88.4 kg (02/02 0428) Last BM Date: 04/30/20 Filed Weights   04/30/20 0428  Weight: 88.4 kg   Assessed pt through glass door, did not enter room.  General: resting quietly in bed.   Heart: sinus tachy, low 100s on tele Chest: no labored breathing or cough Abdomen: visually not distended    Intake/Output from previous day: 02/01 0701 - 02/02 0700 In: 2651.1 [I.V.:1772.1; Blood:661; IV Piggyback:218] Out: 300 [Urine:300]  Intake/Output this shift: Total I/O In: -  Out: 400 [Urine:400]  Lab Results: Recent Labs    04/30/20 0209 04/30/20 0457 04/30/20 1119  WBC  --  4.1  --   HGB 7.7* 7.5* 7.5*  HCT 21.3* 20.7* 22.7*  PLT  --  33*  --    BMET Recent Labs    04/29/20 0629 04/29/20 0815 04/29/20 1625 04/30/20 0457  NA 135 135 138 136  K 3.9 4.8 5.4* 4.1  CL 102 104  --  104  CO2  --  19*  --  20*  GLUCOSE 479* 464*  --  287*  BUN 33* 26*  --  34*  CREATININE 2.10* 2.38*  --  2.14*  CALCIUM  --  7.1*  --  7.7*   LFT Recent Labs    04/29/20 0815 04/30/20 0457  PROT 4.2* 4.9*  ALBUMIN 1.8* 2.3*  AST 40 88*  ALT 30 77*  ALKPHOS 64 48  BILITOT 0.8 1.0   PT/INR Recent Labs    04/29/20 0815 04/30/20 0457  LABPROT 19.4* 17.3*  INR 1.7* 1.5*   Hepatitis Panel No results for input(s): HEPBSAG, HCVAB, HEPAIGM, HEPBIGM in the last 72  hours.  Studies/Results: DG CHEST PORT 1 VIEW  Result Date: 04/29/2020 CLINICAL DATA:  Central line placement EXAM: PORTABLE CHEST 1 VIEW COMPARISON:  04/29/2020 chest radiograph. FINDINGS: Right internal jugular central venous catheter terminates in lower third of the SVC. Left internal jugular central venous catheter terminates in high left mediastinum at the superior margin of the aortic arch in an indeterminate position. Pacer pads overlie chest bilaterally. Stable cardiomediastinal silhouette with top-normal heart size. No pneumothorax. Stable small left pleural effusion. No right pleural effusion. No pulmonary edema. Stable mild left basilar hazy opacity, favor atelectasis. IMPRESSION: 1. Right internal jugular central venous catheter terminates in the lower third of the SVC. 2. Left internal jugular central venous catheter terminates in the high left mediastinum at the superior margin of the aortic arch in an indeterminate position, suspicious for arterial placement as previously mentioned. 3. Stable small left pleural effusion and mild left basilar hazy opacity, favor atelectasis. Critical Value/emergent results were called by telephone at the time of interpretation on 04/29/2020 at 11:12 am to provider DR. CHANDS, who verbally acknowledged these results. The left neck catheter has been removed  by the time of this interpretation. Electronically Signed   By: Ilona Sorrel M.D.   On: 04/29/2020 11:13   DG Chest Port 1 View  Result Date: 04/29/2020 CLINICAL DATA:  Redressed mint of central line. EXAM: PORTABLE CHEST 1 VIEW COMPARISON:  04/27/2020 FINDINGS: Left internal jugular central venous catheter with the tip kinked and projecting at the superior aspect of the aortic arch. Similar cardiomediastinal silhouette. Similar small left pleural effusion with overlying opacities. No visible pneumothorax on this limited supine radiograph. IMPRESSION: 1. Left internal jugular central venous catheter with the tip  kinked and projecting at the superior aspect of the aortic arch. While this could represent a venous placement, arterial placement cannot be excluded on this image. Recommend correlation with blood gas analysis. 2. Similar small left pleural effusion with overlying opacities, which could represent atelectasis, aspiration, and/or pneumonia. Findings and recommendations discussed with Dr. Christy Gentles via telephone at 8:34 a.m. Electronically Signed   By: Margaretha Sheffield MD   On: 04/29/2020 08:38   DG Chest Portable 1 View  Result Date: 04/29/2020 CLINICAL DATA:  Line placement EXAM: PORTABLE CHEST 1 VIEW COMPARISON:  April 26, 2020 FINDINGS: The cardiomediastinal silhouette is unchanged and mildly enlarged in contour.LEFT IJ CVC tip terminates over the expected region of the LEFT brachiocephalic vein confluence with the subclavian. Small LEFT pleural effusion, unchanged. No pneumothorax. LEFT retrocardiac opacity, possibly atelectasis. Visualized abdomen is unremarkable. No acute osseous abnormality. IMPRESSION: LEFT IJ CVC tip terminates over the expected region of the LEFT brachiocephalic vein confluence with the subclavian. No pneumothorax. Electronically Signed   By: Valentino Saxon MD   On: 04/29/2020 07:54   DG Foot 2 Views Left  Result Date: 04/29/2020 CLINICAL DATA:  Fall. EXAM: LEFT FOOT - 2 VIEW COMPARISON:  None. FINDINGS: No acute fracture or dislocation is identified. There are large posterior and plantar calcaneal enthesophytes. There is the suggestion of soft tissue swelling at the medial aspect of the ankle. Atherosclerotic vascular calcifications are noted. IMPRESSION: No acute osseous abnormality identified. Electronically Signed   By: Logan Bores M.D.   On: 04/29/2020 13:20    Scheduled Meds: . chlorhexidine gluconate (MEDLINE KIT)  15 mL Mouth Rinse BID  . Chlorhexidine Gluconate Cloth  6 each Topical Q0600  . insulin aspart  0-20 Units Subcutaneous Q4H  . [START ON 05/02/2020]  pantoprazole  40 mg Intravenous Q12H  . sodium chloride flush  10-40 mL Intracatheter Q12H  . sucralfate  1 g Oral Q6H   Continuous Infusions: . cefTRIAXone (ROCEPHIN)  IV 1 g (04/30/20 0942)  . ferumoxytol 510 mg (04/30/20 1032)  . octreotide  (SANDOSTATIN)    IV infusion 50 mcg/hr (04/30/20 0600)  . pantoprozole (PROTONIX) infusion 8 mg/hr (04/30/20 0615)   PRN Meds:.albuterol, sodium chloride flush   ASSESMENT:   *   Hematemesis.  Recent banding of esophageal varices 04/29/2020 CGE: Grade 1, 2 and 3 esophageal varices, multiple post banding ulcers.  A few of the ulcers actively oozing blood.  Ulcer is nearly completely eradicated following EBL of 6 bands.  Clotted blood throughout the stomach.  No reaccumulation of blood following suction/lavage.  G OV 2 gastroesophageal varices, no signs of active bleeding though stigmata of bleeding might have been missed due to presence of blood in the stomach.  Blood in duodenum lavaged, no active duodenal bleeding. Day 2/6 Rocephin  *    Acute blood loss anemia.  Relatively stable Hb at 7.5, up from 5.1 yesterday.  Status post multiple  PRBC.  Feraheme infusion this AM.    *    NASH Cirrhosis.  *     Thrombocytopenia.  Lites further declined, 33K today.  Received platelets yesterday  *     Coagulopathy.  INR 1.7 >> 1.5.  Received FFP, IV vitamin K yesterday  *     Acute blood loss anemia.   *    04/29/2020 left neck exploration and removal of malpositioned central line.  *   Covid 19 positive.    PLAN   *   Complete 72 hours octreotide, 72 hours Protonix drip.  Carafate 4 times daily (AC/at bedtime once he is taking p.o.) Complete 6 days of Rocephin.  If recurrent hemodynamically unstable bleeding would need IR to evaluate for TIPS plus BRTO. Could start potential IR evaluation with CT scan with contrast and TTE.  Note ultrasound abdomen ordered.      Azucena Freed  04/30/2020, 1:17 PM Phone [phone]

## 2020-04-30 NOTE — Progress Notes (Signed)
Patient extubated by MD at approximately 1000 today without any issues, patient subsequently placed on 2L nasal cannula with saturations at 97%. Patient verbalizing and doing well.

## 2020-04-30 NOTE — Progress Notes (Signed)
Assisted tele visit to patient with wife.  Maryelizabeth Rowan, RN

## 2020-04-30 NOTE — Progress Notes (Signed)
NAME:  Corey Wood, MRN:  [id number], DOB:  December 26, 1950, LOS: 1 ADMISSION DATE:  04/29/2020, CONSULTATION DATE:  04/29/2020 REFERRING MD:  Dr. Christy Gentles, CHIEF COMPLAINT:  UGIB  Brief History:  70 year old male with hx of NASH cirrhosis with recent esophageal variceal banding on 1/20 presenting from home s/p fall with hematemesis and melena with AMS and in hemorraghic shock with MTP initiated in ER.  GI consulted, PCCM to admit.   History of Present Illness:  HPI obtained mostly from medical chart review as patient remains encephalopathic.    70 year old male with prior history of NASH cirrhosis, known variceal and gastroesophageal varices w/ banding (previously intolerant to BB given symptomatic bradycardia), CKD stage III, MDS with pancytopenia, and recent COVID + presenting from [facility] after fall and bloody emesis and diarrhea with reports of > 1L of bloody emesis with EMS.    Of note, patient underwent recent EGD on 1/20 with Dr. Havery Moros for known large esophageal varices and underwent eleven bands.  Unfortunately he returned and hospitalized 1/28- 1/30 for UGIB presumed bleeding from a banded ulcer; he underwent EGD again on 1/28 by Dr. Benson Norway found to have large (> 5 mm) esophageal varices, esophageal ulcers with no stigmata of recent bleeding, and portal hypertensive gastropathy; no interventions performed.  He was also noted to test positive for COVID 1/28 but was asymptomatic.   On arrival to ER, he was minimally responsive, pale, hypotensive with SBP in the 40-60's, and afebrile.  He was started on mass transfusion protocol s/p 4 units PRBC and receiving 2 units of FFP and platelets now.  Hgb 5.1/ HCT 15, previously 7.8 on 1/30, lactic acid 9.7,  IO placed f/b emergent central line placement.  Patient has had improvement in mental status with improvement in BP and intubation deferred at this time.  Other labs currently pending.  Protonix and octreotide gtt ordered.  Currently remains on  NRB.  [facility] GI consulted and will see.  PCCM to admit.   Past Medical History:  Never smoker, NASH cirrhosis, hx of recent EGD with variceal banding and ulcerations, COPD, OSA, GERD, HTN, HLD, DMT2, CKD stage IIIa, MDS with pancytopenia, gallstone pancreatitis, anxiety/ depression, parkinsonism, murmur  COVID + 04/25/2020  Significant Hospital Events:  1/28- 1/30 UGIB  2/1 admit PCCM   Consults:  GI- [facility]  Procedures:  2/1 L IJ CVL  >>  Significant Diagnostic Tests:  1/20 EGD (Dr. Havery Moros) >> - Esophagogastric landmarks identified. - Large esophageal varices. Banded x 11. - Type 2 gastroesophageal varices (GOV2, esophageal varices which extend along the fundus). - Erythematous mucosa in the antrum. - Normal duodenal bulb and second portion of the duodenum.  1/28 EGD (Dr. Benson Norway) >> - Large (> 5 mm) esophageal varices. - Esophageal ulcers with no stigmata of recent bleeding. - Portal hypertensive gastropathy. - A medium amount of food (residue) in the stomach. - Normal examined duodenum. - No specimens collected.  Micro Data:   Antimicrobials:  2/1 ceftriaxone >>  Interim History / Subjective:   EGD yesterday with columns of varices, 6 bands placed.   L CVC also removed by vascular.  Hgb 7.5 this AM, left intubated post-procedure    Objective   Blood pressure (!) 113/53, pulse 71, temperature 100.1 F (37.8 C), temperature source Axillary, resp. rate 20, weight 88.4 kg, SpO2 99 %.    Vent Mode: PRVC FiO2 (%):  [40 %-100 %] 40 % Set Rate:  [20 bmp] 20 bmp Vt  Set:  [520 mL] 520 mL PEEP:  [5 cmH20] 5 cmH20 Plateau Pressure:  [20 cmH20-22 cmH20] 21 cmH20   Intake/Output Summary (Last 24 hours) at 04/30/2020 0816 Last data filed at 04/30/2020 0600 Gross per 24 hour  Intake 2651.05 ml  Output 300 ml  Net 2351.05 ml   Filed Weights   04/30/20 0428  Weight: 88.4 kg    General:  Elderly M, intubated and sedated  HEENT: MM pink/moist, ETT in place R peri-orbital  ecchymosis  Neuro: seen deeply sedated on Propofol, pupils responsive, triggering vent, otherwise unresponsive, R CVC in place, site of L CVC removal with small hematoma, but no erythema or induration CV: s1s2 rrr, no m/r/g PULM:  Mechanical vent sounds bilaterally, no wheezing or rhonchi, on full vent support PRVC PEEP 5, 40% FiO2 GI: soft, bsx4 active,   Extremities: warm/dry,  edema  Skin: no rashes or lesions   Resolved Hospital Problem list    Assessment & Plan:   Hemorraghic shock from suspected esophageal/ gastroesophageal varices ABLA NASH cirrhosis Esophageal/ gastroesophageal varices with recent banding 04/17/20 x 11  -s/p massive transfusion protocol yesterday, 4 units PRBC's, 2 FFP and one platelet -EGD with columns of varices, 6 bands placed -left intubated and sedated post-procedure  -Continue PPI for 72hrs then transition to po PPI BID, Octreotide gtt for 72 hours, Carafate 1g QID while on clear liquids -No NSAIDS for at least 2 weeks -Hgb stable this AM, 7.5, transfuse for <7.0, q 6hr CBC -Platelets 33, no active bleeding, trend  -Propofol weaned and pt extubated - Off Levophed, Monitor BP, MAP goal 55-60 to prevent further exacerbation of bleeding, may need to resume home Cozaar -On CTX for empiric SBP coverage -Per GI recs-clear liquid diet x2 days, IV iron -If pt shows signs of further hemodynamic decompensation then will need to consider TIPS +BRTO evaluation -GI recommends obtaining TTE to eval for candidacy for TIPS, also obtain Liver doppler to eval vasculature      Encephalopathy POA Was improving prior to EGD P: -Extubated and off Propofol this AM -Monitor mental status, neuro checks, CTH if indicated    L Foot Lacerations Xray without subcutaneous air -seen by ortho, continue wound care  AoCKD stage IIIa,  likely prerenal from shock  P: -Creatinine improving, continue to follow renal indices, electrolytes and avoid nephrotoxins as  able   Lactic acidosis  -improving with treatment of hemorrhagic shock   DMT2 - SSI  MDS with pancytopenia  -trend CBC -transfuse for Hgb <7 -outpatient f/u   COVID + -asymptomatic on admission, positive test, 1/28 -continue airborne and contact precautions    Hx COPD/ OSA - albuterol HFA prn     Hx HTN/ HLD - Likely resume home Losartan, Lipitor tomorrow  Anemia  - hold home iron/ B12 - transfuse as above, Hgb < 7  Anxiety/ depression  - hold home trazodone/ NPO    Best practice (evaluated daily)  Diet: clear liquid Pain/Anxiety/Delirium protocol (if indicated): n/a VAP protocol (if indicated): n/a DVT prophylaxis: SCDs only  GI prophylaxis: PPI gtt Glucose control: SSI Mobility: Bedrest Disposition: admit ICU  Goals of Care:  Last date of multidisciplinary goals of care discussion:pending Family and staff present:  Summary of discussion:  Follow up goals of care discussion due:  Code Status: Full   Labs   CBC: Recent Labs  Lab 04/25/20 1811 04/26/20 0136 04/26/20 0536 04/26/20 0900 04/26/20 1158 04/29/20 1255 04/29/20 1625 04/29/20 2004 04/30/20 0209 04/30/20 0457  WBC 3.8*  2.1* 2.4* 2.5*  --   --   --   --   --  4.1  NEUTROABS 3.0  --   --   --   --   --   --   --   --   --   HGB 8.6* 6.9* 7.7* 7.8*   < > 8.4* 8.5* 7.9* 7.7* 7.5*  HCT 25.9* 20.8* 23.6* 23.5*   < > 25.4* 25.0* 23.0* 21.3* 20.7*  MCV 94.2 94.5 94.0 92.5  --   --   --   --   --  88.8  PLT 72* 63* 63* 63*  --   --   --   --   --  33*   < > = values in this interval not displayed.    Basic Metabolic Panel: Recent Labs  Lab 04/25/20 1811 04/27/20 0512 04/29/20 0629 04/29/20 0815 04/29/20 1625 04/30/20 0457  NA 134* 136 135 135 138 136  K 5.5* 4.4 3.9 4.8 5.4* 4.1  CL 100 102 102 104  --  104  CO2 26 28  --  19*  --  20*  GLUCOSE 374* 159* 479* 464*  --  287*  BUN 17 26* 33* 26*  --  34*  CREATININE 2.12* 1.85* 2.10* 2.38*  --  2.14*  CALCIUM 8.6* 8.4*  --  7.1*   --  7.7*   GFR: Estimated Creatinine Clearance: 33.6 mL/min (A) (by C-G formula based on SCr of 2.14 mg/dL (H)). Recent Labs  Lab 04/26/20 0136 04/26/20 0536 04/26/20 0900 04/29/20 0629 04/29/20 1254 04/30/20 0457  WBC 2.1* 2.4* 2.5*  --   --  4.1  LATICACIDVEN  --   --   --  9.7* 2.4*  --     Liver Function Tests: Recent Labs  Lab 04/25/20 1811 04/27/20 0512 04/29/20 0815 04/30/20 0457  AST 26 20 40 88*  ALT 19 15 30  77*  ALKPHOS 77 52 64 48  BILITOT 0.9 0.5 0.8 1.0  PROT 6.7 6.2* 4.2* 4.9*  ALBUMIN 3.0* 2.8* 1.8* 2.3*   Recent Labs  Lab 04/25/20 1811  LIPASE 29   No results for input(s): AMMONIA in the last 168 hours.  ABG    Component Value Date/Time   PHART 7.454 (H) 04/29/2020 1625   PCO2ART 36.7 04/29/2020 1625   PO2ART 376 (H) 04/29/2020 1625   HCO3 25.8 04/29/2020 1625   TCO2 27 04/29/2020 1625   O2SAT 100.0 04/29/2020 1625     Coagulation Profile: Recent Labs  Lab 04/29/20 0815 04/30/20 0457  INR 1.7* 1.5*    Cardiac Enzymes: No results for input(s): CKTOTAL, CKMB, CKMBINDEX, TROPONINI in the last 168 hours.  HbA1C: Hgb A1c MFr Bld  Date/Time Value Ref Range Status  04/29/2020 09:23 AM 6.6 (H) 4.8 - 5.6 % Final    Comment:    (NOTE) Pre diabetes:          5.7%-6.4%  Diabetes:              >6.4%  Glycemic control for   <7.0% adults with diabetes     CBG: Recent Labs  Lab 04/29/20 1556 04/29/20 1929 04/29/20 2329 04/30/20 0338 04/30/20 0752  GLUCAP 242* 233* 297* 257* 263*    Review of Systems:   unable  Past Medical History:  He,  has a past medical history of Allergy, Anxiety, Aortic valve disorder, Arthritis, Asthma, Cataract, Cirrhosis (HCC), CKD (chronic kidney disease), stage III (HCC), COPD (chronic obstructive pulmonary disease) (HCC), Depression,  DM (diabetes mellitus) ([place]), GERD (gastroesophageal reflux disease), Heart murmur, History of colon polyps, acute pancreatitis (10/2019), Hyperlipidemia, Hypertension,  Hypothyroidism, Myelodysplastic syndrome ([place]), Neuromuscular disorder ([place]), Obstructive sleep apnea, Parkinsonism ([place]), and Peripheral positional vertigo.   Surgical History:   Past Surgical History:  Procedure Laterality Date  . ANKLE SURGERY    . CHOLECYSTECTOMY    . ESOPHAGEAL BANDING N/A 04/17/2020   Procedure: ESOPHAGEAL BANDING;  Surgeon: Yetta Flock, MD;  Location: [facility];  Service: Gastroenterology;  Laterality: N/A;  . ESOPHAGOGASTRODUODENOSCOPY (EGD) WITH PROPOFOL N/A 04/17/2020   Procedure: ESOPHAGOGASTRODUODENOSCOPY (EGD) WITH PROPOFOL;  Surgeon: Yetta Flock, MD;  Location: [facility];  Service: Gastroenterology;  Laterality: N/A;  . EYE SURGERY    . HERNIA REPAIR    . KIDNEY STONE SURGERY    . WISDOM TOOTH EXTRACTION       Social History:   reports that he has never smoked. He has never used smokeless tobacco. He reports previous alcohol use. He reports previous drug use.   Family History:  His family history includes Liver cancer in his mother. There is no history of Colon cancer or Stomach cancer.   Allergies Allergies  Allergen Reactions  . Lisinopril Cough     Home Medications  Prior to Admission medications   Medication Sig Start Date End Date Taking? Authorizing Provider  albuterol (VENTOLIN HFA) 108 (90 Base) MCG/ACT inhaler Inhale 2 puffs into the lungs daily as needed for wheezing or shortness of breath.    [provider]  ARTIFICIAL SALIVA MT Use as directed 4 sprays in the mouth or throat at bedtime.    [provider]  aspirin 81 MG chewable tablet Chew 1 tablet (81 mg total) by mouth daily. 05/02/20   Pokhrel, Corrie Mckusick, MD  atorvastatin (LIPITOR) 20 MG tablet Take 20 mg by mouth at bedtime.    [provider]  buPROPion (WELLBUTRIN XL) 150 MG 24 hr tablet Take 150 mg by mouth daily.    [provider]  cetirizine (ZYRTEC) 10 MG chewable tablet Chew 10 mg by mouth at bedtime.    [provider]  ciprofloxacin (CIPRO) 500 MG tablet Take 1 tablet (500 mg total) by mouth 2 (two) times daily for 5 days. 04/27/20 05/02/20  Pokhrel, Corrie Mckusick, MD  diclofenac Sodium (VOLTAREN) 1 % GEL Apply 2 g topically 2 (two) times daily as needed (pain).    [provider]  ferrous sulfate 325 (65 FE) MG tablet Take 325 mg by mouth every Monday, Wednesday, and Friday.    [provider]  fluticasone (FLONASE) 50 MCG/ACT nasal spray Place 2 sprays into both nostrils daily.    [provider]  glucose 4 GM chewable tablet Chew 1 tablet by mouth daily as needed for low blood sugar (If drop below 70).    [provider]  HM LIDOCAINE PATCH EX Apply 1 patch topically every 12 (twelve) hours as needed (Pain). 5%    [provider]  insulin glargine (LANTUS) 100 unit/mL SOPN Inject 12 Units into the skin daily.    [provider]  ipratropium (ATROVENT) 0.03 % nasal spray Place 2 sprays into both nostrils at bedtime.    [provider]  ketotifen (ZADITOR) 0.025 % ophthalmic solution Place 1 drop into both eyes at bedtime.    [provider]  losartan (COZAAR) 100 MG tablet Take 100 mg by mouth at bedtime.    [provider]  meclizine (ANTIVERT) 25 MG tablet Take 25  mg by mouth 2 (two) times daily.    [provider]  montelukast (SINGULAIR) 10 MG tablet Take 10 mg by mouth daily.    [provider]  pantoprazole (PROTONIX) 40 MG tablet Take 40 mg by mouth daily.    [provider]  potassium citrate (UROCIT-K) 10 MEQ (1080 MG) SR tablet Take 10 mEq by mouth 2 (two) times daily.    [provider]  tamsulosin (FLOMAX) 0.4 MG CAPS capsule Take 0.4 mg by mouth 2 (two) times daily.    [provider]  traZODone (DESYREL) 150 MG tablet Take 75 mg by mouth at bedtime.    [provider]  vitamin B-12 (CYANOCOBALAMIN) 500 MCG tablet Take 500 mcg by mouth every other day.     [provider]     Critical care time: 60 mins     Kennieth Rad, ACNP [facility] 04/30/2020, 8:16 AM

## 2020-05-01 ENCOUNTER — Inpatient Hospital Stay (HOSPITAL_COMMUNITY): Payer: No Typology Code available for payment source

## 2020-05-01 ENCOUNTER — Other Ambulatory Visit: Payer: Self-pay

## 2020-05-01 ENCOUNTER — Encounter (HOSPITAL_COMMUNITY): Payer: Self-pay | Admitting: Gastroenterology

## 2020-05-01 DIAGNOSIS — I361 Nonrheumatic tricuspid (valve) insufficiency: Secondary | ICD-10-CM

## 2020-05-01 DIAGNOSIS — K920 Hematemesis: Secondary | ICD-10-CM | POA: Diagnosis not present

## 2020-05-01 DIAGNOSIS — I351 Nonrheumatic aortic (valve) insufficiency: Secondary | ICD-10-CM

## 2020-05-01 DIAGNOSIS — K922 Gastrointestinal hemorrhage, unspecified: Secondary | ICD-10-CM | POA: Diagnosis not present

## 2020-05-01 DIAGNOSIS — R195 Other fecal abnormalities: Secondary | ICD-10-CM

## 2020-05-01 LAB — GLUCOSE, CAPILLARY
Glucose-Capillary: 203 mg/dL — ABNORMAL HIGH (ref 70–99)
Glucose-Capillary: 216 mg/dL — ABNORMAL HIGH (ref 70–99)
Glucose-Capillary: 181 mg/dL — ABNORMAL HIGH (ref 70–99)
Glucose-Capillary: 275 mg/dL — ABNORMAL HIGH (ref 70–99)
Glucose-Capillary: 317 mg/dL — ABNORMAL HIGH (ref 70–99)
Glucose-Capillary: 271 mg/dL — ABNORMAL HIGH (ref 70–99)

## 2020-05-01 LAB — HEMOGLOBIN AND HEMATOCRIT, BLOOD
HCT: 20.2 % — ABNORMAL LOW (ref 39.0–52.0)
HCT: 21.7 % — ABNORMAL LOW (ref 39.0–52.0)
Hemoglobin: 6.7 g/dL — CL (ref 13.0–17.0)
Hemoglobin: 7.5 g/dL — ABNORMAL LOW (ref 13.0–17.0)

## 2020-05-01 LAB — PREPARE RBC (CROSSMATCH)

## 2020-05-01 LAB — ECHOCARDIOGRAM LIMITED
AR max vel: 1.92 cm2
AV Area VTI: 2.11 cm2
AV Area mean vel: 1.76 cm2
AV Mean grad: 12 mmHg
AV Peak grad: 20.1 mmHg
Ao pk vel: 2.24 m/s
Area-P 1/2: 2.76 cm2
P 1/2 time: 679 msec
S' Lateral: 3.2 cm

## 2020-05-01 LAB — CBC
HCT: 23.1 % — ABNORMAL LOW (ref 39.0–52.0)
Hemoglobin: 7.6 g/dL — ABNORMAL LOW (ref 13.0–17.0)
MCH: 30.8 pg (ref 26.0–34.0)
MCHC: 32.9 g/dL (ref 30.0–36.0)
MCV: 93.5 fL (ref 80.0–100.0)
Platelets: 33 10*3/uL — ABNORMAL LOW (ref 150–400)
RBC: 2.47 MIL/uL — ABNORMAL LOW (ref 4.22–5.81)
RDW: 15.3 % (ref 11.5–15.5)
WBC: 3.7 10*3/uL — ABNORMAL LOW (ref 4.0–10.5)
nRBC: 0 % (ref 0.0–0.2)

## 2020-05-01 MED ORDER — IPRATROPIUM BROMIDE 0.06 % NA SOLN
2.0000 | Freq: Every day | NASAL | Status: DC
  Administered 2020-05-01 – 2020-05-04 (×4): 2 via NASAL
  Filled 2020-05-01: qty 15

## 2020-05-01 MED ORDER — SODIUM CHLORIDE 0.9% IV SOLUTION: Freq: Once | INTRAVENOUS | Status: AC

## 2020-05-01 MED ORDER — CITALOPRAM HYDROBROMIDE 20 MG PO TABS
40.0000 mg | ORAL_TABLET | Freq: Every day | ORAL | Status: DC
  Administered 2020-05-01 – 2020-05-05 (×5): 40 mg via ORAL
  Filled 2020-05-01 (×5): qty 2

## 2020-05-01 MED ORDER — TAMSULOSIN HCL 0.4 MG PO CAPS
0.4000 mg | ORAL_CAPSULE | Freq: Two times a day (BID) | ORAL | Status: DC
  Administered 2020-05-01 – 2020-05-05 (×8): 0.4 mg via ORAL
  Filled 2020-05-01 (×8): qty 1

## 2020-05-01 MED ORDER — TRAZODONE HCL 150 MG PO TABS
75.0000 mg | ORAL_TABLET | Freq: Every day | ORAL | Status: DC
  Administered 2020-05-01 – 2020-05-04 (×4): 75 mg via ORAL
  Filled 2020-05-01 (×4): qty 1

## 2020-05-01 MED ORDER — ENSURE ENLIVE PO LIQD
237.0000 mL | Freq: Three times a day (TID) | ORAL | Status: DC
  Administered 2020-05-01 – 2020-05-02 (×4): 237 mL via ORAL

## 2020-05-01 MED ORDER — KETOTIFEN FUMARATE 0.025 % OP SOLN
1.0000 [drp] | Freq: Every day | OPHTHALMIC | Status: DC
  Administered 2020-05-01 – 2020-05-04 (×4): 1 [drp] via OPHTHALMIC
  Filled 2020-05-01: qty 5

## 2020-05-01 MED ORDER — BUPROPION HCL ER (XL) 150 MG PO TB24
150.0000 mg | ORAL_TABLET | Freq: Every day | ORAL | Status: DC
  Administered 2020-05-01 – 2020-05-05 (×5): 150 mg via ORAL
  Filled 2020-05-01 (×5): qty 1

## 2020-05-01 MED ORDER — OXYCODONE HCL 5 MG PO TABS
2.5000 mg | ORAL_TABLET | ORAL | Status: DC | PRN
  Administered 2020-05-01 – 2020-05-02 (×2): 5 mg via ORAL
  Filled 2020-05-01 (×2): qty 1

## 2020-05-01 MED ORDER — FLUTICASONE PROPIONATE 50 MCG/ACT NA SUSP
2.0000 | Freq: Every day | NASAL | Status: DC
  Administered 2020-05-01 – 2020-05-05 (×5): 2 via NASAL
  Filled 2020-05-01: qty 16

## 2020-05-01 MED ORDER — VITAMIN B-12 1000 MCG PO TABS
500.0000 ug | ORAL_TABLET | ORAL | Status: DC
  Administered 2020-05-01 – 2020-05-05 (×4): 500 ug via ORAL
  Filled 2020-05-01 (×5): qty 1

## 2020-05-01 NOTE — Progress Notes (Signed)
Daily Rounding Note  05/01/2020, 11:20 AM  LOS: 2 days   SUBJECTIVE:   Chief complaint: bleeding from post variceal band ligation.      Several black stools yesterday and overnight.   BPs to low 100s/ 50s but now 1teens-140s/60s-80s.  HR currently in 70s/80s.    OBJECTIVE:         Vital signs in last 24 hours:    Temp:  [97.9 F (36.6 C)-98.7 F (37.1 C)] 98.7 F (37.1 C) (02/03 0800) Pulse Rate:  [70-107] 70 (02/03 0800) Resp:  [12-31] 28 (02/03 0800) BP: (105-142)/(50-82) 118/66 (02/03 0800) SpO2:  [91 %-99 %] 93 % (02/03 0800) Arterial Line BP: (92-168)/(51-134) 137/52 (02/03 0800) Weight:  [86.5 kg] 86.5 kg (02/03 0420) Last BM Date: 05/01/20 Filed Weights   04/30/20 0428 05/01/20 0420  Weight: 88.4 kg 86.5 kg   General: alert   Heart: RRR Chest: no labored breathing Abdomen: not distended  Neuro/Psych:  Calm, alert.   Did not enter room for exam, seen from glass door.    Intake/Output from previous day: 02/02 0701 - 02/03 0700 In: 1422.9 [I.V.:905.9; Blood:300; IV Piggyback:217] Out: 2100 [Urine:2100]  Intake/Output this shift: No intake/output data recorded.  Lab Results: Recent Labs    04/30/20 0457 04/30/20 1119 04/30/20 1719 04/30/20 2311 05/01/20 0626  WBC 4.1  --   --   --   --   HGB 7.5*   < > 7.3* 6.7* 7.5*  HCT 20.7*   < > 21.6* 20.2* 21.7*  PLT 33*  --   --   --   --    < > = values in this interval not displayed.   BMET Recent Labs    04/29/20 0629 04/29/20 0815 04/29/20 1625 04/30/20 0457  NA 135 135 138 136  K 3.9 4.8 5.4* 4.1  CL 102 104  --  104  CO2  --  19*  --  20*  GLUCOSE 479* 464*  --  287*  BUN 33* 26*  --  34*  CREATININE 2.10* 2.38*  --  2.14*  CALCIUM  --  7.1*  --  7.7*   LFT Recent Labs    04/29/20 0815 04/30/20 0457  PROT 4.2* 4.9*  ALBUMIN 1.8* 2.3*  AST 40 88*  ALT 30 77*  ALKPHOS 64 48  BILITOT 0.8 1.0   PT/INR Recent Labs     04/29/20 0815 04/30/20 0457  LABPROT 19.4* 17.3*  INR 1.7* 1.5*   Hepatitis Panel No results for input(s): HEPBSAG, HCVAB, HEPAIGM, HEPBIGM in the last 72 hours.  Studies/Results: DG Foot 2 Views Left  Result Date: 04/29/2020 CLINICAL DATA:  Fall. EXAM: LEFT FOOT - 2 VIEW COMPARISON:  None. FINDINGS: No acute fracture or dislocation is identified. There are large posterior and plantar calcaneal enthesophytes. There is the suggestion of soft tissue swelling at the medial aspect of the ankle. Atherosclerotic vascular calcifications are noted. IMPRESSION: No acute osseous abnormality identified. Electronically Signed   By: Logan Bores M.D.   On: 04/29/2020 13:20    ASSESMENT:   *   Hematemesis.  1/20 banding of large esophageal varices 04/25/2020 EGD with persistent large esophageal varices and ulcers without stigmata of bleeding at site of previous banding.  Portal gastropathy. 04/29/2020 EGD: Grade 1, 2 and 3 esophageal varices, multiple post banding ulcers.  A few of the ulcers actively oozing blood.  Ulcer is nearly completely eradicated following EBL of 6 bands.  Clotted blood throughout the stomach.  No reaccumulation of blood following suction/lavage.  GOV 2 gastroesophageal varices, no signs of active bleeding though stigmata of bleeding might have been missed due to presence of blood in the stomach.  Blood in duodenum lavaged, no active duodenal bleeding. Day 3/6 Rocephin.  Day 3 Carafate.  72 hour Protonix drip finishes tomorrow, 05/02/2020, at 7:30 AM.  72 hours octreotide and is just tomorrow at 10 AM. Still passing dark stools.    *    Acute blood loss anemia.  Relatively stable Hgb 5.1 >> 8.5  >   6.7..  Status post multiple PRBC.  Feraheme infusion this AM.   1 additional PRBC ordered today. Feraheme x2 doses ordered, first dose today.  *    NASH Cirrhosis.  *     Thrombocytopenia.  Lites further declined, 33K yesterday.  Received platelets yesterday.  No platelet measurement  this morning.  *     Coagulopathy.  INR 1.7 >> 1.5.  Received FFP, IV vitamin K yesterday  *     Acute blood loss anemia.   *    04/29/2020 left neck exploration and removal of malpositioned central line.  *   Covid 19 positive.      PLAN   *   Continue carafate.  Switches to Protonix 40 IV bid tomorrow.finishes octreotide tmrw AM.    *    Ultrasound ordered.  To evaluate candidacy for TIPS.  Not yet performed.  *   CBC bid.  H and H just obtained.      Azucena Freed  05/01/2020, 11:20 AM Phone [phone]

## 2020-05-01 NOTE — Progress Notes (Signed)
  Echocardiogram 2D Echocardiogram has been performed.  Corey Wood 05/01/2020, 2:47 PM

## 2020-05-01 NOTE — Progress Notes (Signed)
eLink Physician-Brief Progress Note Patient Name: [name] DOB: 07-29-1950 MRN: [id number]   Date of Service  05/01/2020  HPI/Events of Note  Acute blood loss anemia with hemoglobin of 6.7 gm / dl.  eICU Interventions  Order entered to transfuse 1 unit PRBC.        Corey Wood 05/01/2020, 1:57 AM

## 2020-05-01 NOTE — Progress Notes (Signed)
IR aware of request for possible TIPS in pt with hx of NASH cirrhosis and bleeding varices and acute blood loss anemia. Had banding done last month. Chart, including most recent EGD report reviewed showing ulcerations at banding site but no active bleeding. No imaging on file, though US Liver is ordered. Pt also with COVID+ status. MELDNa score=20 PLTs: 33k Cr at 2.14, likely AKI on CKD as Cr was mildly elevated at 1.5 last November. Seems hemodynamically stable at present. Will follow and await/review US Liver. May need further imaging such as CTA TIPS/BRTO protocol despite renal fxn.  Ascencion Dike PA-C Interventional Radiology 05/01/2020 3:44 PM

## 2020-05-01 NOTE — Progress Notes (Signed)
NAME:  Corey Wood, MRN:  [id number], DOB:  [birth year], LOS: 2 ADMISSION DATE:  04/29/2020, CONSULTATION DATE:  04/29/2020 REFERRING MD:  Dr. Christy Gentles, CHIEF COMPLAINT:  UGIB  Brief History:  70 year old male with hx of NASH cirrhosis with recent esophageal variceal banding on 1/20 presenting from home s/p fall with hematemesis and melena with AMS and in hemorraghic shock with MTP initiated in ER.  GI consulted, PCCM to admit.   History of Present Illness:  HPI obtained mostly from medical chart review as patient remains encephalopathic.    70 year old male with prior history of NASH cirrhosis, known variceal and gastroesophageal varices w/ banding (previously intolerant to BB given symptomatic bradycardia), CKD stage III, MDS with pancytopenia, and recent COVID + presenting from [facility] after fall and bloody emesis and diarrhea with reports of > 1L of bloody emesis with EMS.    Of note, patient underwent recent EGD on 1/20 with Dr. Havery Moros for known large esophageal varices and underwent eleven bands.  Unfortunately he returned and hospitalized 1/28- 1/30 for UGIB presumed bleeding from a banded ulcer; he underwent EGD again on 1/28 by Dr. Benson Norway found to have large (> 5 mm) esophageal varices, esophageal ulcers with no stigmata of recent bleeding, and portal hypertensive gastropathy; no interventions performed.  He was also noted to test positive for COVID 1/28 but was asymptomatic.   On arrival to ER, he was minimally responsive, pale, hypotensive with SBP in the 40-60's, and afebrile.  He was started on mass transfusion protocol s/p 4 units PRBC and receiving 2 units of FFP and platelets now.  Hgb 5.1/ HCT 15, previously 7.8 on 1/30, lactic acid 9.7,  IO placed f/b emergent central line placement.  Patient has had improvement in mental status with improvement in BP and intubation deferred at this time.  Other labs currently pending.  Protonix and octreotide gtt ordered.  Currently remains on  NRB.  [facility] GI consulted and will see.  PCCM to admit.   Past Medical History:  Never smoker, NASH cirrhosis, hx of recent EGD with variceal banding and ulcerations, COPD, OSA, GERD, HTN, HLD, DMT2, CKD stage IIIa, MDS with pancytopenia, gallstone pancreatitis, anxiety/ depression, parkinsonism, murmur  COVID + 04/25/2020  Significant Hospital Events:  1/28- 1/30 UGIB  2/1 admit PCCM   Consults:  GI- [facility]  Procedures:  2/1 L IJ CVL  >>  Significant Diagnostic Tests:  1/20 EGD (Dr. Havery Moros) >> - Esophagogastric landmarks identified. - Large esophageal varices. Banded x 11. - Type 2 gastroesophageal varices (GOV2, esophageal varices which extend along the fundus). - Erythematous mucosa in the antrum. - Normal duodenal bulb and second portion of the duodenum.  1/28 EGD (Dr. Benson Norway) >> - Large (> 5 mm) esophageal varices. - Esophageal ulcers with no stigmata of recent bleeding. - Portal hypertensive gastropathy. - A medium amount of food (residue) in the stomach. - Normal examined duodenum. - No specimens collected.  Micro Data:   Antimicrobials:  2/1 ceftriaxone >>  Interim History / Subjective:   Extubated yesterday, no complains. Still having some melena.  Appetite improving.    Objective   Blood pressure 118/66, pulse 70, temperature 98.7 F (37.1 C), temperature source Oral, resp. rate (!) 28, weight 86.5 kg, SpO2 93 %.    Vent Mode: Stand-by   Intake/Output Summary (Last 24 hours) at 05/01/2020 0900 Last data filed at 05/01/2020 0600 Gross per 24 hour  Intake 1422.93 ml  Output 1850 ml  Net -  427.07 ml   Filed Weights   04/30/20 0428 05/01/20 0420  Weight: 88.4 kg 86.5 kg    General:  Elderly M, extubated and interactive HEENT: MM pink/moist, ETT in place R peri-orbital ecchymosis  Neuro: awake and interactive. CV: s1s2 rrr, no m/r/g PULM: chest clear.  GI: soft, bsx4 active,   Extremities: warm/dry,  edema  Skin: no rashes or lesions   Resolved Hospital  Problem list    Assessment & Plan:   Hemorraghic shock from suspected esophageal/ gastroesophageal varices ABLA NASH cirrhosis Esophageal/ gastroesophageal varices with recent banding 04/17/20 x 11 -s/p massive transfusion protocol yesterday, 4 units PRBC's, 2 FFP and one platelet -EGD with columns of varices, 6 bands placed -Continue PPI for 72hrs then transition to po PPI BID, Octreotide gtt for 72 hours, Carafate 1g QID while on clear liquids -No NSAIDS for at least 2 weeks -Hgb stable this AM, 7.5, transfuse for <7.0, q 6hr CBC -Platelets 33, no active bleeding, trend  -Propofol weaned and pt extubated - Off Levophed, Monitor BP, MAP goal 55-60 to prevent further exacerbation of bleeding, may need to resume home Cozaar -On CTX for empiric SBP coverage -Per GI recs-clear liquid diet x2 days, IV iron -If pt shows signs of further hemodynamic decompensation then will need to consider TIPS +BRTO evaluation -GI recommends obtaining TTE to eval for candidacy for TIPS, also obtain Liver doppler to eval vasculature  Encephalopathy POA Was improving prior to EGD P: -Extubated and off Propofol this AM -Monitor mental status, neuro checks, CTH if indicated  L Foot Lacerations Xray without subcutaneous air -seen by ortho, continue wound care  AoCKD stage IIIa,  likely prerenal from shock  P: -Creatinine improving, continue to follow renal indices, electrolytes and avoid nephrotoxins as able  DMT2 - SSI  MDS with pancytopenia  -trend CBC -transfuse for Hgb <7 -outpatient f/u  COVID + -asymptomatic on admission, positive test, 1/28 -continue airborne and contact precautions   Hx COPD/ OSA - albuterol HFA prn    Hx HTN/ HLD - Likely resume home Losartan, Lipitor tomorrow  Anemia  - hold home iron/ B12 - transfuse as above, Hgb < 7  Anxiety/ depression  - hold home trazodone/ NPO  Best practice (evaluated daily)  Diet: clear liquid Pain/Anxiety/Delirium protocol (if  indicated): n/a VAP protocol (if indicated): n/a DVT prophylaxis: SCDs only  GI prophylaxis: PPI gtt Glucose control: SSI Mobility: Bedrest Disposition: transfer to med-surgical. Orders reconciled and TRH informed.   Goals of Care:  Last date of multidisciplinary goals of care discussion:pending Family and staff present:  Summary of discussion:  Follow up goals of care discussion due:  Code Status: Full   Labs   CBC: Recent Labs  Lab 04/25/20 1811 04/26/20 0136 04/26/20 0536 04/26/20 0900 04/26/20 1158 04/30/20 0457 04/30/20 1119 04/30/20 1719 04/30/20 2311 05/01/20 0626  WBC 3.8* 2.1* 2.4* 2.5*  --  4.1  --   --   --   --   NEUTROABS 3.0  --   --   --   --   --   --   --   --   --   HGB 8.6* 6.9* 7.7* 7.8*   < > 7.5* 7.5* 7.3* 6.7* 7.5*  HCT 25.9* 20.8* 23.6* 23.5*   < > 20.7* 22.7* 21.6* 20.2* 21.7*  MCV 94.2 94.5 94.0 92.5  --  88.8  --   --   --   --   PLT 72* 63* 63* 63*  --  33*  --   --   --   --    < > = values in this interval not displayed.    Basic Metabolic Panel: Recent Labs  Lab 04/25/20 1811 04/27/20 0512 04/29/20 0629 04/29/20 0815 04/29/20 1625 04/30/20 0457  NA 134* 136 135 135 138 136  K 5.5* 4.4 3.9 4.8 5.4* 4.1  CL 100 102 102 104  --  104  CO2 26 28  --  19*  --  20*  GLUCOSE 374* 159* 479* 464*  --  287*  BUN 17 26* 33* 26*  --  34*  CREATININE 2.12* 1.85* 2.10* 2.38*  --  2.14*  CALCIUM 8.6* 8.4*  --  7.1*  --  7.7*   GFR: Estimated Creatinine Clearance: 33.3 mL/min (A) (by C-G formula based on SCr of 2.14 mg/dL (H)). Recent Labs  Lab 04/26/20 0136 04/26/20 0536 04/26/20 0900 04/29/20 0629 04/29/20 1254 04/30/20 0457  WBC 2.1* 2.4* 2.5*  --   --  4.1  LATICACIDVEN  --   --   --  9.7* 2.4*  --     Liver Function Tests: Recent Labs  Lab 04/25/20 1811 04/27/20 0512 04/29/20 0815 04/30/20 0457  AST 26 20 40 88*  ALT 19 15 30  77*  ALKPHOS 77 52 64 48  BILITOT 0.9 0.5 0.8 1.0  PROT 6.7 6.2* 4.2* 4.9*  ALBUMIN 3.0*  2.8* 1.8* 2.3*   Recent Labs  Lab 04/25/20 1811  LIPASE 29   No results for input(s): AMMONIA in the last 168 hours.  ABG    Component Value Date/Time   PHART 7.454 (H) 04/29/2020 1625   PCO2ART 36.7 04/29/2020 1625   PO2ART 376 (H) 04/29/2020 1625   HCO3 25.8 04/29/2020 1625   TCO2 27 04/29/2020 1625   O2SAT 100.0 04/29/2020 1625     Coagulation Profile: Recent Labs  Lab 04/29/20 0815 04/30/20 0457  INR 1.7* 1.5*    Cardiac Enzymes: No results for input(s): CKTOTAL, CKMB, CKMBINDEX, TROPONINI in the last 168 hours.  HbA1C: Hgb A1c MFr Bld  Date/Time Value Ref Range Status  04/29/2020 09:23 AM 6.6 (H) 4.8 - 5.6 % Final    Comment:    (NOTE) Pre diabetes:          5.7%-6.4%  Diabetes:              >6.4%  Glycemic control for   <7.0% adults with diabetes     CBG: Recent Labs  Lab 04/30/20 1537 04/30/20 1940 04/30/20 2318 05/01/20 0307 05/01/20 0819  GLUCAP 190* 202* 184* 203* 216*   35 min spent with >50% time in counseling and coordination of care.   Kipp Brood, MD [facility] ICU Physician [place]  Pager: [phone] Or Epic Secure Chat After hours: [phone].  05/01/2020, 9:03 AM

## 2020-05-01 NOTE — Care Management (Signed)
1359 05-01-20 Primary insurance listed as [employer]. Case Manager called [place] to make the fees coordinator aware of hospitalization. Case Manager will continue to follow for additional transition of care needs. Graves-Bigelow, Ocie Cornfield, RN, BSN Case Manager

## 2020-05-02 DIAGNOSIS — D62 Acute posthemorrhagic anemia: Secondary | ICD-10-CM

## 2020-05-02 DIAGNOSIS — K7460 Unspecified cirrhosis of liver: Secondary | ICD-10-CM | POA: Diagnosis not present

## 2020-05-02 DIAGNOSIS — I8501 Esophageal varices with bleeding: Secondary | ICD-10-CM | POA: Diagnosis not present

## 2020-05-02 DIAGNOSIS — R578 Other shock: Secondary | ICD-10-CM | POA: Diagnosis not present

## 2020-05-02 LAB — CBC
HCT: 22.5 % — ABNORMAL LOW (ref 39.0–52.0)
HCT: 24.9 % — ABNORMAL LOW (ref 39.0–52.0)
Hemoglobin: 7.3 g/dL — ABNORMAL LOW (ref 13.0–17.0)
Hemoglobin: 8.1 g/dL — ABNORMAL LOW (ref 13.0–17.0)
MCH: 30.7 pg (ref 26.0–34.0)
MCH: 30.9 pg (ref 26.0–34.0)
MCHC: 32.4 g/dL (ref 30.0–36.0)
MCHC: 32.5 g/dL (ref 30.0–36.0)
MCV: 94.5 fL (ref 80.0–100.0)
MCV: 95 fL (ref 80.0–100.0)
Platelets: 38 10*3/uL — ABNORMAL LOW (ref 150–400)
Platelets: 40 10*3/uL — ABNORMAL LOW (ref 150–400)
RBC: 2.38 MIL/uL — ABNORMAL LOW (ref 4.22–5.81)
RBC: 2.62 MIL/uL — ABNORMAL LOW (ref 4.22–5.81)
RDW: 15.5 % (ref 11.5–15.5)
RDW: 15.4 % (ref 11.5–15.5)
WBC: 3 10*3/uL — ABNORMAL LOW (ref 4.0–10.5)
WBC: 3.4 10*3/uL — ABNORMAL LOW (ref 4.0–10.5)
nRBC: 1.7 % — ABNORMAL HIGH (ref 0.0–0.2)
nRBC: 2.1 % — ABNORMAL HIGH (ref 0.0–0.2)

## 2020-05-02 LAB — COMPREHENSIVE METABOLIC PANEL
ALT: 179 U/L — ABNORMAL HIGH (ref 0–44)
AST: 84 U/L — ABNORMAL HIGH (ref 15–41)
Albumin: 2.3 g/dL — ABNORMAL LOW (ref 3.5–5.0)
Alkaline Phosphatase: 50 U/L (ref 38–126)
Anion gap: 6 (ref 5–15)
BUN: 34 mg/dL — ABNORMAL HIGH (ref 8–23)
CO2: 25 mmol/L (ref 22–32)
Calcium: 8.4 mg/dL — ABNORMAL LOW (ref 8.9–10.3)
Chloride: 108 mmol/L (ref 98–111)
Creatinine, Ser: 1.78 mg/dL — ABNORMAL HIGH (ref 0.61–1.24)
GFR, Estimated: 40 mL/min — ABNORMAL LOW (ref >60)
Glucose, Bld: 134 mg/dL — ABNORMAL HIGH (ref 70–99)
Potassium: 4 mmol/L (ref 3.5–5.1)
Sodium: 139 mmol/L (ref 135–145)
Total Bilirubin: 0.7 mg/dL (ref 0.3–1.2)
Total Protein: 5.4 g/dL — ABNORMAL LOW (ref 6.5–8.1)

## 2020-05-02 LAB — GLUCOSE, CAPILLARY
Glucose-Capillary: 208 mg/dL — ABNORMAL HIGH (ref 70–99)
Glucose-Capillary: 110 mg/dL — ABNORMAL HIGH (ref 70–99)
Glucose-Capillary: 230 mg/dL — ABNORMAL HIGH (ref 70–99)
Glucose-Capillary: 404 mg/dL — ABNORMAL HIGH (ref 70–99)
Glucose-Capillary: 331 mg/dL — ABNORMAL HIGH (ref 70–99)

## 2020-05-02 LAB — GLUCOSE, RANDOM: Glucose, Bld: 410 mg/dL — ABNORMAL HIGH (ref 70–99)

## 2020-05-02 LAB — AMMONIA: Ammonia: 34 umol/L (ref 9–35)

## 2020-05-02 MED ORDER — ADULT MULTIVITAMIN W/MINERALS CH
1.0000 | ORAL_TABLET | Freq: Every day | ORAL | Status: DC
  Administered 2020-05-02 – 2020-05-05 (×4): 1 via ORAL
  Filled 2020-05-02 (×4): qty 1

## 2020-05-02 MED ORDER — PROSOURCE PLUS PO LIQD
30.0000 mL | Freq: Two times a day (BID) | ORAL | Status: DC
  Administered 2020-05-02 – 2020-05-05 (×6): 30 mL via ORAL
  Filled 2020-05-02 (×7): qty 30

## 2020-05-02 MED ORDER — GLUCERNA SHAKE PO LIQD
237.0000 mL | Freq: Three times a day (TID) | ORAL | Status: DC
  Administered 2020-05-02 – 2020-05-05 (×3): 237 mL via ORAL

## 2020-05-02 NOTE — Progress Notes (Signed)
Daily Rounding Note  05/02/2020, 11:00 AM  LOS: 3 days   SUBJECTIVE:   Chief complaint:     No stools since arrival to 2W @  4PM yesterday 2/3.   No nausea.  Feeling ok.  Tolerating [place] diet.    OBJECTIVE:         Vital signs in last 24 hours:    Temp:  [98 F (36.7 C)-98.9 F (37.2 C)] 98 F (36.7 C) (02/04 0812) Pulse Rate:  [60-87] 60 (02/04 0812) Resp:  [18-21] 20 (02/04 0812) BP: (106-141)/(53-71) 127/71 (02/04 0812) SpO2:  [94 %-98 %] 95 % (02/04 0812) Weight:  [88.1 kg] 88.1 kg (02/03 2000) Last BM Date: 05/01/20 Filed Weights   04/30/20 0428 05/01/20 0420 05/01/20 2000  Weight: 88.4 kg 86.5 kg 88.1 kg   Not re-examined.    Intake/Output from previous day: 02/03 0701 - 02/04 0700 In: 761.5 [P.O.:120; I.V.:641.5] Out: 1850 [Urine:1850]  Intake/Output this shift: No intake/output data recorded.  Lab Results: Recent Labs    04/30/20 0457 04/30/20 1119 05/01/20 0626 05/01/20 1135 05/02/20 0708  WBC 4.1  --   --  3.7* 3.0*  HGB 7.5*   < > 7.5* 7.6* 7.3*  HCT 20.7*   < > 21.7* 23.1* 22.5*  PLT 33*  --   --  33* 38*   < > = values in this interval not displayed.   BMET Recent Labs    04/29/20 1625 04/30/20 0457 05/02/20 0708  NA 138 136 139  K 5.4* 4.1 4.0  CL  --  104 108  CO2  --  20* 25  GLUCOSE  --  287* 134*  BUN  --  34* 34*  CREATININE  --  2.14* 1.78*  CALCIUM  --  7.7* 8.4*   LFT Recent Labs    04/30/20 0457 05/02/20 0708  PROT 4.9* 5.4*  ALBUMIN 2.3* 2.3*  AST 88* 84*  ALT 77* 179*  ALKPHOS 48 50  BILITOT 1.0 0.7   PT/INR Recent Labs    04/30/20 0457  LABPROT 17.3*  INR 1.5*   Hepatitis Panel No results for input(s): HEPBSAG, HCVAB, HEPAIGM, HEPBIGM in the last 72 hours.  Studies/Results: US LIVER DOPPLER  Result Date: 05/02/2020 CLINICAL DATA:  Cirrhosis EXAM: DUPLEX ULTRASOUND OF LIVER TECHNIQUE: Color and duplex Doppler ultrasound was performed to evaluate  the hepatic in-flow and out-flow vessels. COMPARISON:  CT abdomen pelvis 11/07/2019 FINDINGS: Liver: Normal parenchymal echogenicity. Normal hepatic contour without nodularity. No focal lesion, mass or intrahepatic biliary ductal dilatation. Main Portal Vein size: 1.3 cm Portal Vein Velocities Main Prox:  34 cm/sec Main Mid: 22 cm/sec Main Dist:  21 cm/sec Right: 22 cm/sec Left: 17 cm/sec Hepatic Vein Velocities Right:  20 cm/sec Middle:  17 cm/sec Left:  18 cm/sec IVC: Present and patent with normal respiratory phasicity. Hepatic Artery Velocity:  98 cm/sec Splenic Vein Velocity:  13 cm/sec Spleen: 18.6 x 10.1 x 7.1 cm with a total volume of 695 cm^3 (411 cm^3 is upper limit normal) Portal Vein Occlusion/Thrombus: No Splenic Vein Occlusion/Thrombus: No Ascites: None Varices: None Hydronephrotic a atrophic left kidney partially visualized. IMPRESSION: No definite splenic varices identified. The spleen is enlarged. Splenic vein is patent. If there is continued concern for splenic varices, further evaluation with contrast enhanced abdominal CT should be performed. Electronically Signed   By: Miachel Roux M.D.   On: 05/02/2020 08:11   ECHOCARDIOGRAM LIMITED  Result Date: 05/01/2020 IMPRESSIONS  1. Left ventricular ejection fraction, by estimation, is 60 to 65%. The left ventricle has normal function. The left ventricle has no regional wall motion abnormalities. Left ventricular diastolic parameters were normal.  2. Right ventricular systolic function is low normal. The right ventricular size is mildly enlarged. There is normal pulmonary artery systolic pressure.  3. The mitral valve is normal in structure. Trivial mitral valve regurgitation.  4. The aortic valve is abnormal. Aortic valve regurgitation is mild. Mild to moderate aortic valve sclerosis/calcification is present, without any evidence of aortic stenosis.  5. Aortic dilatation noted. There is mild dilatation of the ascending aorta, measuring 39 mm.  6. The  inferior vena cava is normal in size with greater than 50% respiratory variability, suggesting right atrial pressure of 3 mmHg. FINDINGS  Left Ventricle: Left ventricular ejection fraction, by estimation, is 60 to 65%. The left ventricle has normal function. The left ventricle has no regional wall motion abnormalities. The left ventricular internal cavity size was normal in size. There is  no left ventricular hypertrophy. Left ventricular diastolic parameters were normal. Right Ventricle: The right ventricular size is mildly enlarged. Right vetricular wall thickness was not assessed. Right ventricular systolic function is low normal. There is normal pulmonary artery systolic pressure. The tricuspid regurgitant velocity is  2.48 m/s, and with an assumed right atrial pressure of 3 mmHg, the estimated right ventricular systolic pressure is 0000000 mmHg. Left Atrium: Left atrial size was normal in size. Right Atrium: Right atrial size was normal in size. Pericardium: There is no evidence of pericardial effusion. Mitral Valve: The mitral valve is normal in structure. There is mild thickening of the mitral valve leaflet(s). Trivial mitral valve regurgitation. Tricuspid Valve: The tricuspid valve is normal in structure. Tricuspid valve regurgitation is mild. Aortic Valve: The aortic valve is abnormal. Aortic valve regurgitation is mild. Aortic regurgitation PHT measures 679 msec. Mild to moderate aortic valve sclerosis/calcification is present, without any evidence of aortic stenosis. Aortic valve mean gradient measures 12.0 mmHg. Aortic valve peak gradient measures 20.1 mmHg. Aortic valve area, by VTI measures 2.11 cm. Pulmonic Valve: The pulmonic valve was normal in structure. Pulmonic valve regurgitation is not visualized. Aorta: Aortic dilatation noted. There is mild dilatation of the ascending aorta, measuring 39 mm. Venous: The inferior vena cava is normal in size with greater than 50% respiratory variability,  suggesting right atrial pressure of 3 mmHg. IAS/Shunts: The interatrial septum was not assessed.  Electronically signed by Dorris Carnes MD Signature Date/Time: 05/01/2020/7:58:50 PM    Final     ASSESMENT:   *Hematemesis. 1/20 banding of large esophageal varices 04/25/2020 EGD with persistent large esophageal varices and ulcers without stigmata of bleeding at site of previous banding.  Portal gastropathy. 04/29/2020 [id number] 1, 2and 3 esophageal varices, multiple post banding ulcers. A few of the ulcers actively oozing blood. Ulcer is nearly completely eradicated following EBL of 6 bands. Clotted blood throughout the stomach.No reaccumulation of blood following suction/lavage. GOV 2 gastroesophageal varices,no signs of active bleeding though stigmata of bleeding might have been missed due to presence of blood in the stomach. Blood in duodenum lavaged, no active duodenal bleeding. Day4/6 Rocephin.  Day 4 Carafate.  72 hour Protonix drip completed 2/4. 72 hours octreotide completed 2/4. IR involved as of 2/4.  Would like to pursue vertebra protocol CT when creatinine allows.  Ideally, patient will be medically optimized and off Covid precautions prior to attempting elective TIPS.    *Acute blood loss anemia.  Transfusions of ?  4 PRBCs (not documented) Hgb 5.1 >> 8.5  >>  6.7  >> 1 PRBC >> 7.6 >> 7.3.  S/p multiple PRBC, many not documented.  .  Feraheme x 2 doses ordered, first dose 2/2  * NASH Cirrhosis.  *Thrombocytopenia. 39 K today.  S/p Platelets 2/1.    *Coagulopathy. INR 1.7 >>1.5. Received FFP, IV on 2/1.    *04/29/2020 left neck exploration and removal of malpositioned central line.  * Covid 19 positive. Contact precautions in place.  Small L pleural effusion, mild left basilar haziness favoring ATX per CXR on 2/1.   PLAN   *   CT scan with the RTO protocol if, when creatinine improves.  TIPS timing per IR.    *   Bid CBC.      Corey Wood  05/02/2020, 11:00 AM Phone [phone]

## 2020-05-02 NOTE — Progress Notes (Addendum)
PROGRESS NOTE                                                                                                                                                                                                             Patient Demographics:    Corey Wood, is a 70 y.o. male, DOB - November 12, 1950, PFY:[id number]  Outpatient Primary MD for the patient is [name], MD   Admit date - 04/29/2020   LOS - 3  Chief Complaint  Patient presents with  . Hematemesis       Brief Narrative: Patient is a 70 y.o. male with PMHx of [name] cirrhosis-with recent history of upper GI bleeding on 1/20 requiring EGD with banding-returned to the hospital (1/28-1/30) with recurrent upper GI bleeding-repeat EGD with concern for bleeding from a banded ulcer-presented to the hospital on 2/1 with massive upper GI bleeding with hypovolemic shock.  Patient was resuscitated-transferred in the ICU-required repeat EGD and banding-subsequently stabilized-and transfer to the [facility].  See below for further details.  COVID-19 vaccinated status: Vaccinated and boosted   Significant Events: 1/20>>outpatient EGD with banding of large esophageal varices  1/28-1/30>> upper GI bleeding-hospitalized-EGD on 1/28-no stigmata of bleeding-banded ulcer 2/1>> recurrent upper GI bleeding with hemorrhagic shock-admit to ICU 2/2>> EGD: Multiple post banding ulcers-actively oozing blood-s/p 6 bands, blood in duodenum/stomach. 2/4>> transfer to [facility]  Significant studies: 2/3>> Echo: EF 81-77%, RV systolic function is low normal 2/3>> liver Doppler: No splenic varices, no portal vein occlusion/thrombus  COVID-19 medications:   Antibiotics: Rocephin: 2/1>>  Microbiology data: None  Procedures: 1/20>> EGD 1/28>> EGD 2/1>> EGD  Consults: GI, PCCM, IR  DVT prophylaxis: SCDs Start: 04/29/20 1165     Subjective:    Corey Wood today is  lying comfortably in bed-no melena overnight.   Assessment  & Plan :   Upper GI bleeding due to esophageal varices-with acute blood loss anemia and hemorrhagic shock: Hemorrhagic shock resolved-hemoglobin relatively stable after multiple units of PRBC transfusion-we will complete Protonix and octreotide infusion on 2/4-GI and IR following with recommendations to proceed with TIPS if bleeding reoccurs.Continue to follow CBC-transfuse if hemoglobin less than 7 or actively starts bleeding again.  Remains on prophylactic Rocephin.  AKI on CKD stage IIIa: AKI hemodynamically mediated-due to hemorrhagic shock-improving with supportive care.  Per patient/family-patient has a nonfunctioning left kidney.  Acute metabolic encephalopathy: Due to hypotension/AKI-improved-completely awake and alert this morning.  Ammonia within normal levels.  History of [name] with liver cirrhosis: Relatively well compensated with the exception for variceal bleeding  Myelodysplastic syndrome: Follow CBC closely and transfuse as needed  Thrombocytopenia: Likely secondary to hypersplenism-has been relatively stable over the past few days.  Follow.  COPD/OSA: Stable-continue bronchodilators  BPH: Continue Flomax  Depression: Stable-continue trazodone, Celexa and Wellbutrin  HLD: Continue to hold statin-LFTs elevated  HTN: Continue to hold losartan-BP stable-resume when able  DM-2: CBGs stable-continue SSI  Recent Labs    05/02/20 0302 05/02/20 0825 05/02/20 1136  GLUCAP 208* 110* 230*   COVID-19 infection: Asymptomatic-fully vaccinated and boosted-continue to watch closely.    ABG:    Component Value Date/Time   PHART 7.454 (H) 04/29/2020 1625   PCO2ART 36.7 04/29/2020 1625   PO2ART 376 (H) 04/29/2020 1625   HCO3 25.8 04/29/2020 1625   TCO2 27 04/29/2020 1625   O2SAT 100.0 04/29/2020 1625    Vent Settings: N/A    Condition - Extremely Guarded  Family Communication  :   Spouse-Sharon-[phone] over the phone 2/4  Code Status :  Full Code  Diet :  Diet Order            Diet full liquid Room service appropriate? Yes; Fluid consistency: Thin  Diet effective now                  Disposition Plan  :   Status is: Inpatient  Remains inpatient appropriate because:Inpatient level of care appropriate due to severity of illness   Dispo: The patient is from: Home              Anticipated d/c is to: Home              Anticipated d/c date is: 2 days              Patient currently is not medically stable to d/c.   Difficult to place patient No   Barriers to discharge: Severe upper GI bleeding due to variceal bleeding-at high risk for rebleeding-needs continued inpatient monitoring  Antimicorbials  :    Anti-infectives (From admission, onward)   Start     Dose/Rate Route Frequency Ordered Stop   04/30/20 0930  cefTRIAXone (ROCEPHIN) 1 g in sodium chloride 0.9 % 100 mL IVPB        1 g 200 mL/hr over 30 Minutes Intravenous Every 24 hours 04/30/20 0847 05/06/20 0929   04/30/20 0800  cefTRIAXone (ROCEPHIN) 1 g in sodium chloride 0.9 % 100 mL IVPB  Status:  Discontinued        1 g 200 mL/hr over 30 Minutes Intravenous Every 24 hours 04/29/20 0922 04/30/20 0847   04/29/20 0730  cefTRIAXone (ROCEPHIN) 1 g in sodium chloride 0.9 % 100 mL IVPB        1 g 200 mL/hr over 30 Minutes Intravenous  Once 04/29/20 0254 04/29/20 2706      Inpatient Medications  Scheduled Meds: . (feeding supplement) PROSource Plus  30 mL Oral BID BM  . buPROPion  150 mg Oral Daily  . chlorhexidine gluconate (MEDLINE KIT)  15 mL Mouth Rinse BID  . Chlorhexidine Gluconate Cloth  6 each Topical Q0600  . citalopram  40 mg Oral Daily  . feeding supplement  237 mL Oral TID BM  . fluticasone  2 spray Each Nare Daily  . insulin aspart  0-20 Units Subcutaneous Q4H  . ipratropium  2 spray Each Nare QHS  . ketotifen  1 drop Both Eyes QHS  . multivitamin with minerals  1  tablet Oral Daily  . pantoprazole  40 mg Intravenous Q12H  . sodium chloride flush  10-40 mL Intracatheter Q12H  . sucralfate  1 g Oral Q6H  . tamsulosin  0.4 mg Oral BID  . traZODone  75 mg Oral QHS  . vitamin B-12  500 mcg Oral QODAY   Continuous Infusions: . cefTRIAXone (ROCEPHIN)  IV 1 g (05/02/20 1101)  . ferumoxytol Stopped (04/30/20 1047)   PRN Meds:.albuterol, oxyCODONE, sodium chloride flush   Time Spent in minutes  25  See all Orders from today for further details   Oren Binet M.D on 05/02/2020 at 11:39 AM  To page go to [email or web address] - use universal password  [facility] -  Office  [phone]    Objective:   Vitals:   05/01/20 1628 05/01/20 1959 05/01/20 2000 05/02/20 0812  BP: (!) 131/57 106/71  127/71  Pulse: 73 78  60  Resp: 20   20  Temp:  98.9 F (37.2 C)  98 F (36.7 C)  TempSrc:  Oral  Oral  SpO2: 96% 94%  95%  Weight:   88.1 kg   Height:   _0  (1.702 m)     Wt Readings from Last 3 Encounters:  05/01/20 88.1 kg  04/25/20 77.1 kg  04/17/20 77.1 kg     Intake/Output Summary (Last 24 hours) at 05/02/2020 1139 Last data filed at 05/02/2020 0043 Gross per 24 hour  Intake 761.51 ml  Output 1450 ml  Net -688.49 ml     Physical Exam Gen Exam:Alert awake-not in any distress HEENT:atraumatic, normocephalic Chest: B/L clear to auscultation anteriorly CVS:S1S2 regular Abdomen:soft non tender, non distended Extremities:no edema Neurology: Non focal Skin: no rash   Data Review:    CBC Recent Labs  Lab 04/25/20 1811 04/26/20 0136 04/26/20 0536 04/26/20 0900 04/26/20 1158 04/30/20 0457 04/30/20 1119 04/30/20 1719 04/30/20 2311 05/01/20 0626 05/01/20 1135 05/02/20 0708  WBC 3.8*   < > 2.4* 2.5*  --  4.1  --   --   --   --  3.7* 3.0*  HGB 8.6*   < > 7.7* 7.8*   < > 7.5*   < > 7.3* 6.7* 7.5* 7.6* 7.3*  HCT 25.9*   < > 23.6* 23.5*   < > 20.7*   < > 21.6* 20.2* 21.7* 23.1* 22.5*  PLT 72*   < > 63* 63*  --  33*  --   --    --   --  33* 38*  MCV 94.2   < > 94.0 92.5  --  88.8  --   --   --   --  93.5 94.5  MCH 31.3   < > 30.7 30.7  --  32.2  --   --   --   --  30.8 30.7  MCHC 33.2   < > 32.6 33.2  --  36.2*  --   --   --   --  32.9 32.4  RDW 14.8   < > 15.1 15.6*  --  15.2  --   --   --   --  15.3 15.5  LYMPHSABS 0.4*  --   --   --   --   --   --   --   --   --   --   --   MONOABS 0.2  --   --   --   --   --   --   --   --   --   --   --  EOSABS 0.1  --   --   --   --   --   --   --   --   --   --   --   BASOSABS 0.0  --   --   --   --   --   --   --   --   --   --   --    < > = values in this interval not displayed.    Chemistries  Recent Labs  Lab 04/25/20 1811 04/27/20 0512 04/29/20 0629 04/29/20 0815 04/29/20 1625 04/30/20 0457 05/02/20 0708  NA 134* 136 135 135 138 136 139  K 5.5* 4.4 3.9 4.8 5.4* 4.1 4.0  CL 100 102 102 104  --  104 108  CO2 26 28  --  19*  --  20* 25  GLUCOSE 374* 159* 479* 464*  --  287* 134*  BUN 17 26* 33* 26*  --  34* 34*  CREATININE 2.12* 1.85* 2.10* 2.38*  --  2.14* 1.78*  CALCIUM 8.6* 8.4*  --  7.1*  --  7.7* 8.4*  AST 26 20  --  40  --  88* 84*  ALT 19 15  --  30  --  77* 179*  ALKPHOS 77 52  --  64  --  48 50  BILITOT 0.9 0.5  --  0.8  --  1.0 0.7   ------------------------------------------------------------------------------------------------------------------ Recent Labs    04/30/20 0209  TRIG 289*    Lab Results  Component Value Date   HGBA1C 6.6 (H) 04/29/2020   ------------------------------------------------------------------------------------------------------------------ No results for input(s): TSH, T4TOTAL, T3FREE, THYROIDAB in the last 72 hours.  Invalid input(s): FREET3 ------------------------------------------------------------------------------------------------------------------ No results for input(s): VITAMINB12, FOLATE, FERRITIN, TIBC, IRON, RETICCTPCT in the last 72 hours.  Coagulation profile Recent Labs  Lab 04/29/20 0815  04/30/20 0457  INR 1.7* 1.5*    No results for input(s): DDIMER in the last 72 hours.  Cardiac Enzymes No results for input(s): CKMB, TROPONINI, MYOGLOBIN in the last 168 hours.  Invalid input(s): CK ------------------------------------------------------------------------------------------------------------------ No results found for: BNP  Micro Results Recent Results (from the past 240 hour(s))  MRSA PCR Screening     Status: None   Collection Time: 04/29/20 12:54 PM   Specimen: Nasopharyngeal  Result Value Ref Range Status   MRSA by PCR NEGATIVE NEGATIVE Final    Comment:        The GeneXpert MRSA Assay (FDA approved for NASAL specimens only), is one component of a comprehensive MRSA colonization surveillance program. It is not intended to diagnose MRSA infection nor to guide or monitor treatment for MRSA infections. Performed at [facility], [place] [address], [place], [place] [address]     Radiology Reports DG CHEST PORT 1 VIEW  Result Date: 04/29/2020 CLINICAL DATA:  Central line placement EXAM: PORTABLE CHEST 1 VIEW COMPARISON:  04/29/2020 chest radiograph. FINDINGS: Right internal jugular central venous catheter terminates in lower third of the SVC. Left internal jugular central venous catheter terminates in high left mediastinum at the superior margin of the aortic arch in an indeterminate position. Pacer pads overlie chest bilaterally. Stable cardiomediastinal silhouette with top-normal heart size. No pneumothorax. Stable small left pleural effusion. No right pleural effusion. No pulmonary edema. Stable mild left basilar hazy opacity, favor atelectasis. IMPRESSION: 1. Right internal jugular central venous catheter terminates in the lower third of the SVC. 2. Left internal jugular central venous catheter terminates in the high left mediastinum  at the superior margin of the aortic arch in an indeterminate position, suspicious for arterial placement as previously  mentioned. 3. Stable small left pleural effusion and mild left basilar hazy opacity, favor atelectasis. Critical Value/emergent results were called by telephone at the time of interpretation on 04/29/2020 at 11:12 am to provider DR. CHANDS, who verbally acknowledged these results. The left neck catheter has been removed by the time of this interpretation. Electronically Signed   By: Ilona Sorrel M.D.   On: 04/29/2020 11:13   DG Chest Port 1 View  Result Date: 04/29/2020 CLINICAL DATA:  Redressed mint of central line. EXAM: PORTABLE CHEST 1 VIEW COMPARISON:  04/27/2020 FINDINGS: Left internal jugular central venous catheter with the tip kinked and projecting at the superior aspect of the aortic arch. Similar cardiomediastinal silhouette. Similar small left pleural effusion with overlying opacities. No visible pneumothorax on this limited supine radiograph. IMPRESSION: 1. Left internal jugular central venous catheter with the tip kinked and projecting at the superior aspect of the aortic arch. While this could represent a venous placement, arterial placement cannot be excluded on this image. Recommend correlation with blood gas analysis. 2. Similar small left pleural effusion with overlying opacities, which could represent atelectasis, aspiration, and/or pneumonia. Findings and recommendations discussed with Dr. Christy Gentles via telephone at 8:34 a.m. Electronically Signed   By: Margaretha Sheffield MD   On: 04/29/2020 08:38   DG Chest Portable 1 View  Result Date: 04/29/2020 CLINICAL DATA:  Line placement EXAM: PORTABLE CHEST 1 VIEW COMPARISON:  April 26, 2020 FINDINGS: The cardiomediastinal silhouette is unchanged and mildly enlarged in contour.LEFT IJ CVC tip terminates over the expected region of the LEFT brachiocephalic vein confluence with the subclavian. Small LEFT pleural effusion, unchanged. No pneumothorax. LEFT retrocardiac opacity, possibly atelectasis. Visualized abdomen is unremarkable. No acute osseous  abnormality. IMPRESSION: LEFT IJ CVC tip terminates over the expected region of the LEFT brachiocephalic vein confluence with the subclavian. No pneumothorax. Electronically Signed   By: Valentino Saxon MD   On: 04/29/2020 07:54   DG CHEST PORT 1 VIEW  Result Date: 04/26/2020 CLINICAL DATA:  Hemoptysis EXAM: PORTABLE CHEST 1 VIEW COMPARISON:  11/06/2019 FINDINGS: Small left pleural effusion. Generous heart size accentuated by technique. No air bronchogram or pulmonary edema. No pneumothorax. Extensive artifact from EKG leads. IMPRESSION: Small left pleural effusion. Electronically Signed   By: Monte Fantasia M.D.   On: 04/26/2020 07:50   DG Foot 2 Views Left  Result Date: 04/29/2020 CLINICAL DATA:  Fall. EXAM: LEFT FOOT - 2 VIEW COMPARISON:  None. FINDINGS: No acute fracture or dislocation is identified. There are large posterior and plantar calcaneal enthesophytes. There is the suggestion of soft tissue swelling at the medial aspect of the ankle. Atherosclerotic vascular calcifications are noted. IMPRESSION: No acute osseous abnormality identified. Electronically Signed   By: Logan Bores M.D.   On: 04/29/2020 13:20   US LIVER DOPPLER  Result Date: 05/02/2020 CLINICAL DATA:  Cirrhosis EXAM: DUPLEX ULTRASOUND OF LIVER TECHNIQUE: Color and duplex Doppler ultrasound was performed to evaluate the hepatic in-flow and out-flow vessels. COMPARISON:  CT abdomen pelvis 11/07/2019 FINDINGS: Liver: Normal parenchymal echogenicity. Normal hepatic contour without nodularity. No focal lesion, mass or intrahepatic biliary ductal dilatation. Main Portal Vein size: 1.3 cm Portal Vein Velocities Main Prox:  34 cm/sec Main Mid: 22 cm/sec Main Dist:  21 cm/sec Right: 22 cm/sec Left: 17 cm/sec Hepatic Vein Velocities Right:  20 cm/sec Middle:  17 cm/sec Left:  18 cm/sec IVC: Present  and patent with normal respiratory phasicity. Hepatic Artery Velocity:  98 cm/sec Splenic Vein Velocity:  13 cm/sec Spleen: 18.6 x 10.1 x 7.1  cm with a total volume of 695 cm^3 (411 cm^3 is upper limit normal) Portal Vein Occlusion/Thrombus: No Splenic Vein Occlusion/Thrombus: No Ascites: None Varices: None Hydronephrotic a atrophic left kidney partially visualized. IMPRESSION: No definite splenic varices identified. The spleen is enlarged. Splenic vein is patent. If there is continued concern for splenic varices, further evaluation with contrast enhanced abdominal CT should be performed. Electronically Signed   By: Miachel Roux M.D.   On: 05/02/2020 08:11   ECHOCARDIOGRAM LIMITED  Result Date: 05/01/2020    ECHOCARDIOGRAM REPORT   Patient Name:   ZIAN DELAIR Date of Exam: 05/01/2020 Medical Rec #:  [id number]      Height:       67.0 in Accession #:    [id number]     Weight:       190.7 lb Date of Birth:  [birth year]      BSA:          1.982 m Patient Age:    89 years       BP:           141/66 mmHg Patient Gender: M              HR:           63 bpm. Exam Location:  Inpatient Procedure: Limited Echo, Limited Color Doppler and Cardiac Doppler Indications:    esophageal varices  History:        Patient has no prior history of Echocardiogram examinations.                 Covid; Risk Factors:Diabetes.  Sonographer:    Johny Chess Referring Phys: [id number] [place]  1. Left ventricular ejection fraction, by estimation, is 60 to 65%. The left ventricle has normal function. The left ventricle has no regional wall motion abnormalities. Left ventricular diastolic parameters were normal.  2. Right ventricular systolic function is low normal. The right ventricular size is mildly enlarged. There is normal pulmonary artery systolic pressure.  3. The mitral valve is normal in structure. Trivial mitral valve regurgitation.  4. The aortic valve is abnormal. Aortic valve regurgitation is mild. Mild to moderate aortic valve sclerosis/calcification is present, without any evidence of aortic stenosis.  5. Aortic dilatation noted. There is mild  dilatation of the ascending aorta, measuring 39 mm.  6. The inferior vena cava is normal in size with greater than 50% respiratory variability, suggesting right atrial pressure of 3 mmHg. FINDINGS  Left Ventricle: Left ventricular ejection fraction, by estimation, is 60 to 65%. The left ventricle has normal function. The left ventricle has no regional wall motion abnormalities. The left ventricular internal cavity size was normal in size. There is  no left ventricular hypertrophy. Left ventricular diastolic parameters were normal. Right Ventricle: The right ventricular size is mildly enlarged. Right vetricular wall thickness was not assessed. Right ventricular systolic function is low normal. There is normal pulmonary artery systolic pressure. The tricuspid regurgitant velocity is  2.48 m/s, and with an assumed right atrial pressure of 3 mmHg, the estimated right ventricular systolic pressure is 51.8 mmHg. Left Atrium: Left atrial size was normal in size. Right Atrium: Right atrial size was normal in size. Pericardium: There is no evidence of pericardial effusion. Mitral Valve: The mitral valve is normal in structure. There is mild thickening of the mitral  valve leaflet(s). Trivial mitral valve regurgitation. Tricuspid Valve: The tricuspid valve is normal in structure. Tricuspid valve regurgitation is mild. Aortic Valve: The aortic valve is abnormal. Aortic valve regurgitation is mild. Aortic regurgitation PHT measures 679 msec. Mild to moderate aortic valve sclerosis/calcification is present, without any evidence of aortic stenosis. Aortic valve mean gradient measures 12.0 mmHg. Aortic valve peak gradient measures 20.1 mmHg. Aortic valve area, by VTI measures 2.11 cm. Pulmonic Valve: The pulmonic valve was normal in structure. Pulmonic valve regurgitation is not visualized. Aorta: Aortic dilatation noted. There is mild dilatation of the ascending aorta, measuring 39 mm. Venous: The inferior vena cava is normal in  size with greater than 50% respiratory variability, suggesting right atrial pressure of 3 mmHg. IAS/Shunts: The interatrial septum was not assessed.  LEFT VENTRICLE PLAX 2D LVIDd:         5.10 cm  Diastology LVIDs:         3.20 cm  LV e' medial:   8.38 cm/s LV PW:         1.00 cm  LV E/e' medial: 8.3 LV IVS:        0.90 cm LVOT diam:     2.00 cm LV SV:         99 LV SV Index:   50 LVOT Area:     3.14 cm  IVC IVC diam: 1.70 cm LEFT ATRIUM         Index LA diam:    3.80 cm 1.92 cm/m  AORTIC VALVE AV Area (Vmax):    1.92 cm AV Area (Vmean):   1.76 cm AV Area (VTI):     2.11 cm AV Vmax:           224.00 cm/s AV Vmean:          163.000 cm/s AV VTI:            0.470 m AV Peak Grad:      20.1 mmHg AV Mean Grad:      12.0 mmHg LVOT Vmax:         137.00 cm/s LVOT Vmean:        91.300 cm/s LVOT VTI:          0.316 m LVOT/AV VTI ratio: 0.67 AI PHT:            679 msec  AORTA Ao Root diam: 3.20 cm Ao Asc diam:  3.90 cm MITRAL VALVE               TRICUSPID VALVE MV Area (PHT): 2.76 cm    TR Peak grad:   24.6 mmHg MV Decel Time: 275 msec    TR Vmax:        248.00 cm/s MV E velocity: 69.80 cm/s MV A velocity: 82.70 cm/s  SHUNTS MV E/A ratio:  0.84        Systemic VTI:  0.32 m                            Systemic Diam: 2.00 cm Dorris Carnes MD Electronically signed by Dorris Carnes MD Signature Date/Time: 05/01/2020/7:58:50 PM    Final

## 2020-05-02 NOTE — Progress Notes (Signed)
Inpatient Diabetes Program Recommendations  AACE/ADA: New Consensus Statement on Inpatient Glycemic Control   Target Ranges:  Prepandial:   less than 140 mg/dL      Peak postprandial:   less than 180 mg/dL (1-2 hours)      Critically ill patients:  140 - 180 mg/dL  Results for RANDEN, KAUTH (MRN [id number]) as of 05/02/2020 15:13  Ref. Range 05/01/2020 08:19 05/01/2020 11:04 05/01/2020 16:27 05/01/2020 19:00 05/01/2020 23:05 05/02/2020 03:02 05/02/2020 08:25 05/02/2020 11:36  Glucose-Capillary Latest Ref Range: 70 - 99 mg/dL 216 (H) 181 (H) 275 (H) 317 (H) 271 (H) 208 (H) 110 (H) 230 (H)    Review of Glycemic Control  Diabetes history: DM2 Outpatient Diabetes medications: Lantus 10 units daily Current orders for Inpatient glycemic control: Novolog 0-20 units Q4H  Inpatient Diabetes Program Recommendations:    Insulin: Please consider ordering Lantus 9 units Q24H (based on 88.1 kg x 0.1 units).  Thanks, Barnie Alderman, RN, MSN, CDE Diabetes Coordinator Inpatient Diabetes Program [phone] (Team Pager from 8am to 5pm)

## 2020-05-02 NOTE — Progress Notes (Signed)
Initial Nutrition Assessment  DOCUMENTATION CODES:   Obesity unspecified  INTERVENTION:   Magic cup TID with meals, each supplement provides 290 kcal and 9 grams of protein  57ml Prosource Plus po BID, each supplement provides 100 kcals and 15 grams of protein  MVI with minerals daily  Continue Ensure Enlive po TID, each supplement provides 350 kcal and 20 grams of protein   NUTRITION DIAGNOSIS:   Inadequate oral intake related to acute illness (hemorraghic shock from esophageal/gastroesophageal varices ABLA) as evidenced by other (comment) (full liquid diet).    GOAL:   Patient will meet greater than or equal to 90% of their needs    MONITOR:   PO intake,Supplement acceptance,Diet advancement,Weight trends,Labs,I & O's  REASON FOR ASSESSMENT:   Malnutrition Screening Tool    ASSESSMENT:   Pt presented s/p fall with hematemesis and melena with AMS and in hemorraghic shock from suspected esophageal/gastroesophageal varices ABLA with MTP initiated in ER. PMH includes NASH cirrhosis, recent EGD with variceal banding and ulcerations, COPD, HTN, HLD, type 2 DM, CKD stage III, MDS with pancytopenia, and recent COVID+ (tested positive 1/28).  1/20 banding of large esophageal varices (previous admission) 04/25/2020 EGD with persistent large esophageal varices and ulcers without stigmata of bleeding at site of previous banding, portal gastropathy (previous admission) 2/1 - s/p EGD with Grade 1, 2and 3 esophageal varices, multiple post banding ulcers, pt remained intubated post procedure; L CVC removed  2/2 - extubated  Per MD, pt remains at risk of variceal hemorrhage/post banding ulcers as well as gastric variceal bleeding. MD noted pt may need TIPS urgently +/- BRTO if significant hemodynamic compromise and overt GI bleeding recurs; IR aware. MD hoping pt can be medically optimized and off COVID precautions prior to attempting elective TIPS.   PO Intake: 95% x1 recorded meal  (pt was NPO up until yesterday morning). Pt with orders for Ensure TID and is consuming supplement well per RN. Will order additional supplements to provide pt with more kcals/protein given full liquid diet is inadequate to meet pt's needs.  Reviewed weight history. No significant weight loss noted.   UOP: 1865ml x24 hours  Labs: CBGs 110-208 (Diabetes Coordinator following) Medications: ss novolog Q4H, carafate, vitamin B12  NUTRITION - FOCUSED PHYSICAL EXAM:  Unable to perform at this time. Will attempt at follow-up.   Diet Order:   Diet Order            Diet full liquid Room service appropriate? Yes; Fluid consistency: Thin  Diet effective now                 EDUCATION NEEDS:   No education needs have been identified at this time  Skin:  Skin Assessment: Skin Integrity Issues: Skin Integrity Issues:: Incisions Incisions: L foot, L neck  Last BM:  2/3 type 7  Height:   Ht Readings from Last 1 Encounters:  05/01/20 5\' 7"  (1.702 m)    Weight:   Wt Readings from Last 1 Encounters:  05/01/20 88.1 kg    BMI:  Body mass index is 30.42 kg/m.  Estimated Nutritional Needs:   Kcal:  1900-2100  Protein:  95-105 grams  Fluid:  >1.9L/d    Larkin Ina, MS, RD, LDN RD pager number and weekend/on-call pager number located in [place].

## 2020-05-03 DIAGNOSIS — I8501 Esophageal varices with bleeding: Secondary | ICD-10-CM | POA: Diagnosis not present

## 2020-05-03 DIAGNOSIS — K7460 Unspecified cirrhosis of liver: Secondary | ICD-10-CM | POA: Diagnosis not present

## 2020-05-03 DIAGNOSIS — R578 Other shock: Secondary | ICD-10-CM | POA: Diagnosis not present

## 2020-05-03 DIAGNOSIS — D62 Acute posthemorrhagic anemia: Secondary | ICD-10-CM | POA: Diagnosis not present

## 2020-05-03 LAB — TYPE AND SCREEN
ABO/RH(D): O POS
Antibody Screen: NEGATIVE
Unit division: 0
Unit division: 0
Unit division: 0
Unit division: 0
Unit division: 0
Unit division: 0
Unit division: 0
Unit division: 0

## 2020-05-03 LAB — CBC
HCT: 21.7 % — ABNORMAL LOW (ref 39.0–52.0)
Hemoglobin: 7.2 g/dL — ABNORMAL LOW (ref 13.0–17.0)
MCH: 30.9 pg (ref 26.0–34.0)
MCHC: 33.2 g/dL (ref 30.0–36.0)
MCV: 93.1 fL (ref 80.0–100.0)
Platelets: 40 10*3/uL — ABNORMAL LOW (ref 150–400)
RBC: 2.33 MIL/uL — ABNORMAL LOW (ref 4.22–5.81)
RDW: 15.1 % (ref 11.5–15.5)
WBC: 3.7 10*3/uL — ABNORMAL LOW (ref 4.0–10.5)
nRBC: 2.5 % — ABNORMAL HIGH (ref 0.0–0.2)

## 2020-05-03 LAB — BPAM RBC
Blood Product Expiration Date: 202202192359
Blood Product Expiration Date: 202202212359
Blood Product Expiration Date: 202203022359
Blood Product Expiration Date: 202203022359
Blood Product Expiration Date: 202203022359
Blood Product Expiration Date: 202203022359
Blood Product Expiration Date: 202203022359
Blood Product Expiration Date: 202203032359
ISSUE DATE / TIME: 202202010630
ISSUE DATE / TIME: 202202010711
ISSUE DATE / TIME: 202202010721
ISSUE DATE / TIME: 202202010721
ISSUE DATE / TIME: 202202030241
Unit Type and Rh: 5100
Unit Type and Rh: 5100
Unit Type and Rh: 5100
Unit Type and Rh: 5100
Unit Type and Rh: 5100
Unit Type and Rh: 5100
Unit Type and Rh: 5100
Unit Type and Rh: 5100

## 2020-05-03 LAB — COMPREHENSIVE METABOLIC PANEL
ALT: 129 U/L — ABNORMAL HIGH (ref 0–44)
AST: 45 U/L — ABNORMAL HIGH (ref 15–41)
Albumin: 2.2 g/dL — ABNORMAL LOW (ref 3.5–5.0)
Alkaline Phosphatase: 58 U/L (ref 38–126)
Anion gap: 7 (ref 5–15)
BUN: 33 mg/dL — ABNORMAL HIGH (ref 8–23)
CO2: 26 mmol/L (ref 22–32)
Calcium: 8.2 mg/dL — ABNORMAL LOW (ref 8.9–10.3)
Chloride: 103 mmol/L (ref 98–111)
Creatinine, Ser: 1.61 mg/dL — ABNORMAL HIGH (ref 0.61–1.24)
GFR, Estimated: 45 mL/min — ABNORMAL LOW (ref >60)
Glucose, Bld: 169 mg/dL — ABNORMAL HIGH (ref 70–99)
Potassium: 4 mmol/L (ref 3.5–5.1)
Sodium: 136 mmol/L (ref 135–145)
Total Bilirubin: 0.6 mg/dL (ref 0.3–1.2)
Total Protein: 5.2 g/dL — ABNORMAL LOW (ref 6.5–8.1)

## 2020-05-03 LAB — GLUCOSE, CAPILLARY
Glucose-Capillary: 133 mg/dL — ABNORMAL HIGH (ref 70–99)
Glucose-Capillary: 150 mg/dL — ABNORMAL HIGH (ref 70–99)
Glucose-Capillary: 152 mg/dL — ABNORMAL HIGH (ref 70–99)
Glucose-Capillary: 211 mg/dL — ABNORMAL HIGH (ref 70–99)
Glucose-Capillary: 236 mg/dL — ABNORMAL HIGH (ref 70–99)
Glucose-Capillary: 283 mg/dL — ABNORMAL HIGH (ref 70–99)
Glucose-Capillary: 284 mg/dL — ABNORMAL HIGH (ref 70–99)

## 2020-05-03 NOTE — Progress Notes (Signed)
Spoke with patient's nurse.  Patient had a BM this AM that was described as "dark" and then "black" by the tech.  Has only received one unit of PRBCs in the past 72 hours and Hgb hold fairly stable.  No plans for further GI work-up unless evidence of significant bleeding.

## 2020-05-03 NOTE — Progress Notes (Addendum)
PROGRESS NOTE                                                                                                                                                                                                             Patient Demographics:    Corey Wood, is a 70 y.o. male, DOB - 1950-06-14, VXY:[id number]  Outpatient Primary MD for the patient is Corey Milan, MD   Admit date - 04/29/2020   LOS - 4  Chief Complaint  Patient presents with  . Hematemesis       Brief Narrative: Patient is a 70 y.o. male with PMHx of Corey Wood cirrhosis-with recent history of upper GI bleeding on 1/20 requiring EGD with banding-returned to the hospital (1/28-1/30) with recurrent upper GI bleeding-repeat EGD with concern for bleeding from a banded ulcer-presented to the hospital on 2/1 with massive upper GI bleeding with hypovolemic shock.  Patient was resuscitated-transferred in the ICU-required repeat EGD and banding-subsequently stabilized-and transfer to the [facility].  See below for further details.  COVID-19 vaccinated status: Vaccinated and boosted   Significant Events: 1/20>>outpatient EGD with banding of large esophageal varices  1/28-1/30>> upper GI bleeding-hospitalized-EGD on 1/28-no stigmata of bleeding-banded ulcer 2/1>> recurrent upper GI bleeding with hemorrhagic shock-admit to ICU 2/2>> EGD: Multiple post banding ulcers-actively oozing blood-s/p 6 bands, blood in duodenum/stomach. 2/4>> transfer to [facility]  Significant studies: 2/3>> Echo: EF 82-70%, RV systolic function is low normal 2/3>> liver Doppler: No splenic varices, no portal vein occlusion/thrombus  COVID-19 medications:   Antibiotics: Rocephin: 2/1>>  Microbiology data: None  Procedures: 1/20>> EGD 1/28>> EGD 2/1>> EGD  Consults: GI, PCCM, IR  DVT prophylaxis: SCDs Start: 04/29/20 0811     Subjective:   Did not have BM  yesterday-finally had a dark-colored BM earlier today.   Assessment  & Plan :   Upper GI bleeding due to esophageal varices-with acute blood loss anemia and hemorrhagic shock: Hemorrhagic shock resolved-hemoglobin relatively stable after multiple units of PRBC transfusion-dark-colored stool today-given stability of hemoglobin-likely reflective of old blood.  Continue to follow closely-follow CBC-GI/IR following-with plans to proceed with TIPS procedure if bleeding reoccurs.  Transfuse PRBC if patient actively starts bleeding or if hemoglobin less than 7.  On prophylactic Rocephin.  Completed octreotide/Protonix infusion-now on twice daily dosing of octreotide and Carafate.  AKI on CKD stage IIIa:  AKI hemodynamically mediated-due to hemorrhagic shock-improving with supportive care-and now close to baseline.  Per patient/family-patient has a nonfunctioning left kidney.  Acute metabolic encephalopathy: Due to hypotension/AKI-improved-completely awake and alert this morning.  Ammonia within normal levels.  History of Corey Wood with liver cirrhosis: Relatively well compensated with the exception for variceal bleeding  Myelodysplastic syndrome: Follow CBC closely and transfuse as needed  Thrombocytopenia: Likely secondary to hypersplenism-has been relatively stable over the past few days.  Follow.  COPD/OSA: Stable-continue bronchodilators  BPH: Continue Flomax  Depression: Stable-continue trazodone, Celexa and Wellbutrin  HLD: Continue to hold statin-LFTs elevated  HTN: Continue to hold losartan-BP stable-resume when able  DM-2: CBGs stable-continue SSI  Recent Labs    05/03/20 0739 05/03/20 0930 05/03/20 1228  GLUCAP 133* 150* 284*   COVID-19 infection: Asymptomatic-fully vaccinated and boosted-continue to watch closely.    ABG:    Component Value Date/Time   PHART 7.454 (H) 04/29/2020 1625   PCO2ART 36.7 04/29/2020 1625   PO2ART 376 (H) 04/29/2020 1625   HCO3 25.8 04/29/2020 1625    TCO2 27 04/29/2020 1625   O2SAT 100.0 04/29/2020 1625    Vent Settings: N/A    Condition - Extremely Guarded  Family Communication  :  Spouse-Corey Wood-[phone]  on 2/5  Code Status :  Full Code  Diet :  Diet Order            Diet full liquid Room service appropriate? Yes; Fluid consistency: Thin  Diet effective now                  Disposition Plan  :   Status is: Inpatient  Remains inpatient appropriate because:Inpatient level of care appropriate due to severity of illness   Dispo: The patient is from: Home              Anticipated d/c is to: Home              Anticipated d/c date is: 2 days              Patient currently is not medically stable to d/c.   Difficult to place patient No   Barriers to discharge: Severe upper GI bleeding due to variceal bleeding-at high risk for rebleeding-needs continued inpatient monitoring  Antimicorbials  :    Anti-infectives (From admission, onward)   Start     Dose/Rate Route Frequency Ordered Stop   04/30/20 0930  cefTRIAXone (ROCEPHIN) 1 g in sodium chloride 0.9 % 100 mL IVPB        1 g 200 mL/hr over 30 Minutes Intravenous Every 24 hours 04/30/20 0847 05/06/20 0929   04/30/20 0800  cefTRIAXone (ROCEPHIN) 1 g in sodium chloride 0.9 % 100 mL IVPB  Status:  Discontinued        1 g 200 mL/hr over 30 Minutes Intravenous Every 24 hours 04/29/20 0922 04/30/20 0847   04/29/20 0730  cefTRIAXone (ROCEPHIN) 1 g in sodium chloride 0.9 % 100 mL IVPB        1 g 200 mL/hr over 30 Minutes Intravenous  Once 04/29/20 4196 04/29/20 2229      Inpatient Medications  Scheduled Meds: . (feeding supplement) PROSource Plus  30 mL Oral BID BM  . buPROPion  150 mg Oral Daily  . chlorhexidine gluconate (MEDLINE KIT)  15 mL Mouth Rinse BID  . Chlorhexidine Gluconate Cloth  6 each Topical Q0600  . citalopram  40 mg Oral Daily  . feeding supplement (GLUCERNA SHAKE)  237 mL Oral TID  BM  . fluticasone  2 spray Each Nare Daily  . insulin  aspart  0-20 Units Subcutaneous Q4H  . ipratropium  2 spray Each Nare QHS  . ketotifen  1 drop Both Eyes QHS  . multivitamin with minerals  1 tablet Oral Daily  . pantoprazole  40 mg Intravenous Q12H  . sodium chloride flush  10-40 mL Intracatheter Q12H  . sucralfate  1 g Oral Q6H  . tamsulosin  0.4 mg Oral BID  . traZODone  75 mg Oral QHS  . vitamin B-12  500 mcg Oral QODAY   Continuous Infusions: . cefTRIAXone (ROCEPHIN)  IV 1 g (05/03/20 1008)  . ferumoxytol Stopped (04/30/20 1047)   PRN Meds:.albuterol, oxyCODONE, sodium chloride flush   Time Spent in minutes  25  See all Orders from today for further details   Oren Binet M.D on 05/03/2020 at 2:21 PM  To page go to [email or web address] - use universal password  [facility] -  Office  [phone]    Objective:   Vitals:   05/02/20 2157 05/03/20 0500 05/03/20 0606 05/03/20 0910  BP: 133/70  113/67 122/65  Pulse: 63  (!) 52 69  Resp: '16  14 17  ' Temp: 98.4 F (36.9 C)  97.6 F (36.4 C) 97.8 F (36.6 C)  TempSrc:    Oral  SpO2: 94%  94% 97%  Weight:  88.8 kg    Height:        Wt Readings from Last 3 Encounters:  05/03/20 88.8 kg  04/25/20 77.1 kg  04/17/20 77.1 kg    No intake or output data in the 24 hours ending 05/03/20 1421   Physical Exam Gen Exam:Alert awake-not in any distress HEENT:atraumatic, normocephalic Chest: B/L clear to auscultation anteriorly CVS:S1S2 regular Abdomen:soft non tender, non distended Extremities:no edema Neurology: Non focal Skin: no rash   Data Review:    CBC Recent Labs  Lab 04/30/20 0457 04/30/20 1119 05/01/20 0626 05/01/20 1135 05/02/20 0708 05/02/20 1633 05/03/20 0203  WBC 4.1  --   --  3.7* 3.0* 3.4* 3.7*  HGB 7.5*   < > 7.5* 7.6* 7.3* 8.1* 7.2*  HCT 20.7*   < > 21.7* 23.1* 22.5* 24.9* 21.7*  PLT 33*  --   --  33* 38* 40* 40*  MCV 88.8  --   --  93.5 94.5 95.0 93.1  MCH 32.2  --   --  30.8 30.7 30.9 30.9  MCHC 36.2*  --   --  32.9 32.4 32.5 33.2   RDW 15.2  --   --  15.3 15.5 15.4 15.1   < > = values in this interval not displayed.    Chemistries  Recent Labs  Lab 04/27/20 0512 04/29/20 0629 04/29/20 0815 04/29/20 1625 04/30/20 0457 05/02/20 0708 05/02/20 1715 05/03/20 0203  NA 136 135 135 138 136 139  --  136  K 4.4 3.9 4.8 5.4* 4.1 4.0  --  4.0  CL 102 102 104  --  104 108  --  103  CO2 28  --  19*  --  20* 25  --  26  GLUCOSE 159* 479* 464*  --  287* 134* 410* 169*  BUN 26* 33* 26*  --  34* 34*  --  33*  CREATININE 1.85* 2.10* 2.38*  --  2.14* 1.78*  --  1.61*  CALCIUM 8.4*  --  7.1*  --  7.7* 8.4*  --  8.2*  AST 20  --  40  --  88* 84*  --  45*  ALT 15  --  30  --  77* 179*  --  129*  ALKPHOS 52  --  64  --  48 50  --  58  BILITOT 0.5  --  0.8  --  1.0 0.7  --  0.6   ------------------------------------------------------------------------------------------------------------------ No results for input(s): CHOL, HDL, LDLCALC, TRIG, CHOLHDL, LDLDIRECT in the last 72 hours.  Lab Results  Component Value Date   HGBA1C 6.6 (H) 04/29/2020   ------------------------------------------------------------------------------------------------------------------ No results for input(s): TSH, T4TOTAL, T3FREE, THYROIDAB in the last 72 hours.  Invalid input(s): FREET3 ------------------------------------------------------------------------------------------------------------------ No results for input(s): VITAMINB12, FOLATE, FERRITIN, TIBC, IRON, RETICCTPCT in the last 72 hours.  Coagulation profile Recent Labs  Lab 04/29/20 0815 04/30/20 0457  INR 1.7* 1.5*    No results for input(s): DDIMER in the last 72 hours.  Cardiac Enzymes No results for input(s): CKMB, TROPONINI, MYOGLOBIN in the last 168 hours.  Invalid input(s): CK ------------------------------------------------------------------------------------------------------------------ No results found for: BNP  Micro Results Recent Results (from the past 240  hour(s))  MRSA PCR Screening     Status: None   Collection Time: 04/29/20 12:54 PM   Specimen: Nasopharyngeal  Result Value Ref Range Status   MRSA by PCR NEGATIVE NEGATIVE Final    Comment:        The GeneXpert MRSA Assay (FDA approved for NASAL specimens only), is one component of a comprehensive MRSA colonization surveillance program. It is not intended to diagnose MRSA infection nor to guide or monitor treatment for MRSA infections. Performed at [facility], [place] [address], [place], [place] [address]     Radiology Reports DG CHEST PORT 1 VIEW  Result Date: 04/29/2020 CLINICAL DATA:  Central line placement EXAM: PORTABLE CHEST 1 VIEW COMPARISON:  04/29/2020 chest radiograph. FINDINGS: Right internal jugular central venous catheter terminates in lower third of the SVC. Left internal jugular central venous catheter terminates in high left mediastinum at the superior margin of the aortic arch in an indeterminate position. Pacer pads overlie chest bilaterally. Stable cardiomediastinal silhouette with top-normal heart size. No pneumothorax. Stable small left pleural effusion. No right pleural effusion. No pulmonary edema. Stable mild left basilar hazy opacity, favor atelectasis. IMPRESSION: 1. Right internal jugular central venous catheter terminates in the lower third of the SVC. 2. Left internal jugular central venous catheter terminates in the high left mediastinum at the superior margin of the aortic arch in an indeterminate position, suspicious for arterial placement as previously mentioned. 3. Stable small left pleural effusion and mild left basilar hazy opacity, favor atelectasis. Critical Value/emergent results were called by telephone at the time of interpretation on 04/29/2020 at 11:12 am to provider DR. CHANDS, who verbally acknowledged these results. The left neck catheter has been removed by the time of this interpretation. Electronically Signed   By: Ilona Sorrel M.D.   On:  04/29/2020 11:13   DG Chest Port 1 View  Result Date: 04/29/2020 CLINICAL DATA:  Redressed mint of central line. EXAM: PORTABLE CHEST 1 VIEW COMPARISON:  04/27/2020 FINDINGS: Left internal jugular central venous catheter with the tip kinked and projecting at the superior aspect of the aortic arch. Similar cardiomediastinal silhouette. Similar small left pleural effusion with overlying opacities. No visible pneumothorax on this limited supine radiograph. IMPRESSION: 1. Left internal jugular central venous catheter with the tip kinked and projecting at the superior aspect of the aortic arch. While this could represent a venous placement, arterial placement cannot be excluded  on this image. Recommend correlation with blood gas analysis. 2. Similar small left pleural effusion with overlying opacities, which could represent atelectasis, aspiration, and/or pneumonia. Findings and recommendations discussed with Dr. Christy Gentles via telephone at 8:34 a.m. Electronically Signed   By: Margaretha Sheffield MD   On: 04/29/2020 08:38   DG Chest Portable 1 View  Result Date: 04/29/2020 CLINICAL DATA:  Line placement EXAM: PORTABLE CHEST 1 VIEW COMPARISON:  April 26, 2020 FINDINGS: The cardiomediastinal silhouette is unchanged and mildly enlarged in contour.LEFT IJ CVC tip terminates over the expected region of the LEFT brachiocephalic vein confluence with the subclavian. Small LEFT pleural effusion, unchanged. No pneumothorax. LEFT retrocardiac opacity, possibly atelectasis. Visualized abdomen is unremarkable. No acute osseous abnormality. IMPRESSION: LEFT IJ CVC tip terminates over the expected region of the LEFT brachiocephalic vein confluence with the subclavian. No pneumothorax. Electronically Signed   By: Valentino Saxon MD   On: 04/29/2020 07:54   DG CHEST PORT 1 VIEW  Result Date: 04/26/2020 CLINICAL DATA:  Hemoptysis EXAM: PORTABLE CHEST 1 VIEW COMPARISON:  11/06/2019 FINDINGS: Small left pleural effusion.  Generous heart size accentuated by technique. No air bronchogram or pulmonary edema. No pneumothorax. Extensive artifact from EKG leads. IMPRESSION: Small left pleural effusion. Electronically Signed   By: Monte Fantasia M.D.   On: 04/26/2020 07:50   DG Foot 2 Views Left  Result Date: 04/29/2020 CLINICAL DATA:  Fall. EXAM: LEFT FOOT - 2 VIEW COMPARISON:  None. FINDINGS: No acute fracture or dislocation is identified. There are large posterior and plantar calcaneal enthesophytes. There is the suggestion of soft tissue swelling at the medial aspect of the ankle. Atherosclerotic vascular calcifications are noted. IMPRESSION: No acute osseous abnormality identified. Electronically Signed   By: Logan Bores M.D.   On: 04/29/2020 13:20   US LIVER DOPPLER  Result Date: 05/02/2020 CLINICAL DATA:  Cirrhosis EXAM: DUPLEX ULTRASOUND OF LIVER TECHNIQUE: Color and duplex Doppler ultrasound was performed to evaluate the hepatic in-flow and out-flow vessels. COMPARISON:  CT abdomen pelvis 11/07/2019 FINDINGS: Liver: Normal parenchymal echogenicity. Normal hepatic contour without nodularity. No focal lesion, mass or intrahepatic biliary ductal dilatation. Main Portal Vein size: 1.3 cm Portal Vein Velocities Main Prox:  34 cm/sec Main Mid: 22 cm/sec Main Dist:  21 cm/sec Right: 22 cm/sec Left: 17 cm/sec Hepatic Vein Velocities Right:  20 cm/sec Middle:  17 cm/sec Left:  18 cm/sec IVC: Present and patent with normal respiratory phasicity. Hepatic Artery Velocity:  98 cm/sec Splenic Vein Velocity:  13 cm/sec Spleen: 18.6 x 10.1 x 7.1 cm with a total volume of 695 cm^3 (411 cm^3 is upper limit normal) Portal Vein Occlusion/Thrombus: No Splenic Vein Occlusion/Thrombus: No Ascites: None Varices: None Hydronephrotic a atrophic left kidney partially visualized. IMPRESSION: No definite splenic varices identified. The spleen is enlarged. Splenic vein is patent. If there is continued concern for splenic varices, further evaluation with  contrast enhanced abdominal CT should be performed. Electronically Signed   By: Miachel Roux M.D.   On: 05/02/2020 08:11   ECHOCARDIOGRAM LIMITED  Result Date: 05/01/2020    ECHOCARDIOGRAM REPORT   Patient Name:   Corey Wood Date of Exam: 05/01/2020 Medical Rec #:  [id number]      Height:       67.0 in Accession #:    [id number]     Weight:       190.7 lb Date of Birth:  Aug 06, 1950      BSA:  1.982 m Patient Age:    89 years       BP:           141/66 mmHg Patient Gender: M              HR:           63 bpm. Exam Location:  Inpatient Procedure: Limited Echo, Limited Color Doppler and Cardiac Doppler Indications:    esophageal varices  History:        Patient has no prior history of Echocardiogram examinations.                 Covid; Risk Factors:Diabetes.  Sonographer:    Johny Chess Referring Phys: [id number] [place]  1. Left ventricular ejection fraction, by estimation, is 60 to 65%. The left ventricle has normal function. The left ventricle has no regional wall motion abnormalities. Left ventricular diastolic parameters were normal.  2. Right ventricular systolic function is low normal. The right ventricular size is mildly enlarged. There is normal pulmonary artery systolic pressure.  3. The mitral valve is normal in structure. Trivial mitral valve regurgitation.  4. The aortic valve is abnormal. Aortic valve regurgitation is mild. Mild to moderate aortic valve sclerosis/calcification is present, without any evidence of aortic stenosis.  5. Aortic dilatation noted. There is mild dilatation of the ascending aorta, measuring 39 mm.  6. The inferior vena cava is normal in size with greater than 50% respiratory variability, suggesting right atrial pressure of 3 mmHg. FINDINGS  Left Ventricle: Left ventricular ejection fraction, by estimation, is 60 to 65%. The left ventricle has normal function. The left ventricle has no regional wall motion abnormalities. The left ventricular  internal cavity size was normal in size. There is  no left ventricular hypertrophy. Left ventricular diastolic parameters were normal. Right Ventricle: The right ventricular size is mildly enlarged. Right vetricular wall thickness was not assessed. Right ventricular systolic function is low normal. There is normal pulmonary artery systolic pressure. The tricuspid regurgitant velocity is  2.48 m/s, and with an assumed right atrial pressure of 3 mmHg, the estimated right ventricular systolic pressure is 10.6 mmHg. Left Atrium: Left atrial size was normal in size. Right Atrium: Right atrial size was normal in size. Pericardium: There is no evidence of pericardial effusion. Mitral Valve: The mitral valve is normal in structure. There is mild thickening of the mitral valve leaflet(s). Trivial mitral valve regurgitation. Tricuspid Valve: The tricuspid valve is normal in structure. Tricuspid valve regurgitation is mild. Aortic Valve: The aortic valve is abnormal. Aortic valve regurgitation is mild. Aortic regurgitation PHT measures 679 msec. Mild to moderate aortic valve sclerosis/calcification is present, without any evidence of aortic stenosis. Aortic valve mean gradient measures 12.0 mmHg. Aortic valve peak gradient measures 20.1 mmHg. Aortic valve area, by VTI measures 2.11 cm. Pulmonic Valve: The pulmonic valve was normal in structure. Pulmonic valve regurgitation is not visualized. Aorta: Aortic dilatation noted. There is mild dilatation of the ascending aorta, measuring 39 mm. Venous: The inferior vena cava is normal in size with greater than 50% respiratory variability, suggesting right atrial pressure of 3 mmHg. IAS/Shunts: The interatrial septum was not assessed.  LEFT VENTRICLE PLAX 2D LVIDd:         5.10 cm  Diastology LVIDs:         3.20 cm  LV e' medial:   8.38 cm/s LV PW:         1.00 cm  LV E/e' medial: 8.3 LV  IVS:        0.90 cm LVOT diam:     2.00 cm LV SV:         99 LV SV Index:   50 LVOT Area:      3.14 cm  IVC IVC diam: 1.70 cm LEFT ATRIUM         Index LA diam:    3.80 cm 1.92 cm/m  AORTIC VALVE AV Area (Vmax):    1.92 cm AV Area (Vmean):   1.76 cm AV Area (VTI):     2.11 cm AV Vmax:           224.00 cm/s AV Vmean:          163.000 cm/s AV VTI:            0.470 m AV Peak Grad:      20.1 mmHg AV Mean Grad:      12.0 mmHg LVOT Vmax:         137.00 cm/s LVOT Vmean:        91.300 cm/s LVOT VTI:          0.316 m LVOT/AV VTI ratio: 0.67 AI PHT:            679 msec  AORTA Ao Root diam: 3.20 cm Ao Asc diam:  3.90 cm MITRAL VALVE               TRICUSPID VALVE MV Area (PHT): 2.76 cm    TR Peak grad:   24.6 mmHg MV Decel Time: 275 msec    TR Vmax:        248.00 cm/s MV E velocity: 69.80 cm/s MV A velocity: 82.70 cm/s  SHUNTS MV E/A ratio:  0.84        Systemic VTI:  0.32 m                            Systemic Diam: 2.00 cm Dorris Carnes MD Electronically signed by Dorris Carnes MD Signature Date/Time: 05/01/2020/7:58:50 PM    Final

## 2020-05-03 NOTE — Evaluation (Signed)
Physical Therapy Evaluation Patient Details Name: Corey Wood MRN: [id number] DOB: 1950-05-13 Today's Date: 05/03/2020   History of Present Illness  Patient is a 70 y.o. male with PMHx of [name] recent history of upper GI bleeding on 1/20 requiring EGD with banding-returned to the hospital (1/28-1/30) with recurrent upper GI bleeding-repeat EGD with concern for bleeding from a banded ulcer-presented to the hospital on 2/1 with massive upper GI bleeding with hypovolemic shock.  Patient was resuscitated-transferred in the ICU-required repeat EGD and banding-subsequently stabilized-and transfer to the [facility].    Clinical Impression  Pt admitted with above diagnosis. PTA pt lived at home with his wife, mod I mobility with reports of multi falls. On eval, pt required min assist sit to stand and mod assist SPT with RW. Unable to progress gait due to orthostatic hypotension. BP seated 124/75, BP standing 99/57. Of note, Hgb 7.2.  Pt currently with functional limitations due to the deficits listed below (see PT Problem List). Pt will benefit from skilled PT to increase their independence and safety with mobility to allow discharge to the venue listed below.  With improvement in BP and Hgb, suspect pt will be able to progress home with [facility].      Follow Up Recommendations Home health PT;Supervision/Assistance - 24 hour    Equipment Recommendations  None recommended by PT    Recommendations for Other Services       Precautions / Restrictions Precautions Precautions: Fall;Other (comment) Precaution Comments: orthostatic      Mobility  Bed Mobility               General bed mobility comments: up in recliner    Transfers Overall transfer level: Needs assistance Equipment used: Rolling walker (2 wheeled) Transfers: Sit to/from [facility] Sit to Stand: Min assist Stand pivot transfers: Mod assist       General transfer comment: assist  to power up and stabilize balance  Ambulation/Gait             General Gait Details: unable due to orthostatic hypotension  Stairs            Wheelchair Mobility    Modified Rankin (Stroke Patients Only)       Balance Overall balance assessment: Needs assistance Sitting-balance support: Feet supported;Single extremity supported Sitting balance-Leahy Scale: Fair     Standing balance support: Bilateral upper extremity supported;During functional activity Standing balance-Leahy Scale: Poor Standing balance comment: reliant on UE support and external support from therapist                             Pertinent Vitals/Pain Pain Assessment: No/denies pain    Home Living Family/patient expects to be discharged to:: Private residence Living Arrangements: Spouse/significant other Available Help at Discharge: Family Type of Home: House Home Access: Stairs to enter Entrance Stairs-Rails: Right Entrance Stairs-Number of Steps: 3 in front, 2 at side, 4 in back Home Layout: One level Home Equipment: [place] - 4 wheels;Cane - single point;Shower seat      Prior Function Level of Independence: Needs assistance   Gait / Transfers Assistance Needed: Pt reports not typically needing AD for mobility, endorses frequent falls (pt unsure what causes falls but when asked about lightheadedness - pt endorses this as a reason for falls  ADL's / Homemaking Assistance Needed: Pt reports typically Independent with ADLs, but has been requiring some assistance from wife for LB ADLs (putting socks  on, etc) due to B knee pain        Hand Dominance   Dominant Hand: Right    Extremity/Trunk Assessment   Upper Extremity Assessment Upper Extremity Assessment: Overall WFL for tasks assessed    Lower Extremity Assessment Lower Extremity Assessment: Generalized weakness    Cervical / Trunk Assessment Cervical / Trunk Assessment: Normal  Communication   Communication: No  difficulties  Cognition Arousal/Alertness: Awake/alert Behavior During Therapy: WFL for tasks assessed/performed;Flat affect Overall Cognitive Status: No family/caregiver present to determine baseline cognitive functioning Area of Impairment: Attention;Memory;Following commands;Safety/judgement;Problem solving;Awareness                   Current Attention Level: Selective Memory: Decreased short-term memory Following Commands: Follows one step commands with increased time;Follows one step commands consistently Safety/Judgement: Decreased awareness of safety;Decreased awareness of deficits Awareness: Emergent Problem Solving: Requires verbal cues;Difficulty sequencing;Slow processing General Comments: Pleasant and cooperative. Difficulty problem solving. Mildly flat affect.      General Comments General comments (skin integrity, edema, etc.): BP seated in recliner 124/75. BP standing 99/57    Exercises     Assessment/Plan    PT Assessment Patient needs continued PT services  PT Problem List Decreased strength;Decreased mobility;Decreased safety awareness;Decreased activity tolerance;Decreased balance;Decreased cognition       PT Treatment Interventions Therapeutic activities;Gait training;Therapeutic exercise;Patient/family education;Balance training;Functional mobility training;Stair training;Cognitive remediation    PT Goals (Current goals can be found in the Care Plan section)  Acute Rehab PT Goals Patient Stated Goal: home PT Goal Formulation: With patient Time For Goal Achievement: 05/17/20 Potential to Achieve Goals: Fair    Frequency Min 3X/week   Barriers to discharge        Co-evaluation               AM-PAC PT "6 Clicks" Mobility  Outcome Measure Help needed turning from your back to your side while in a flat bed without using bedrails?: A Little Help needed moving from lying on your back to sitting on the side of a flat bed without using  bedrails?: A Little Help needed moving to and from a bed to a chair (including a wheelchair)?: A Lot Help needed standing up from a chair using your arms (e.g., wheelchair or bedside chair)?: A Little Help needed to walk in hospital room?: A Lot Help needed climbing 3-5 steps with a railing? : A Lot 6 Click Score: 15    End of Session Equipment Utilized During Treatment: Gait belt Activity Tolerance: Treatment limited secondary to medical complications (Comment) (orthostatic) Patient left: in chair;with call bell/phone within reach Nurse Communication: Mobility status PT Visit Diagnosis: Unsteadiness on feet (R26.81);Difficulty in walking, not elsewhere classified (R26.2);Muscle weakness (generalized) (M62.81)    Time: 2409-7353 PT Time Calculation (min) (ACUTE ONLY): 13 min   Charges:   PT Evaluation $PT Eval Moderate Complexity: 1 Mod          Lorrin Goodell, PT  Office # [phone] Pager [phone]   Lorriane Shire 05/03/2020, 3:18 PM

## 2020-05-03 NOTE — Evaluation (Signed)
Occupational Therapy Evaluation Patient Details Name: Corey Wood MRN: [id number] DOB: 1950-08-08 Today's Date: 05/03/2020    History of Present Illness Patient is a 70 y.o. male with PMHx of [name] recent history of upper GI bleeding on 1/20 requiring EGD with banding-returned to the hospital (1/28-1/30) with recurrent upper GI bleeding-repeat EGD with concern for bleeding from a banded ulcer-presented to the hospital on 2/1 with massive upper GI bleeding with hypovolemic shock.  Patient was resuscitated-transferred in the ICU-required repeat EGD and banding-subsequently stabilized-and transfer to the [facility].   Clinical Impression   PTA, pt lives with spouse and reports typically ambulatory without use of AD. Pt reports typically able to complete ADLs but has been requiring assistance from wife recently for LB ADLs (socks, pants around feet, etc). Pt endorses recent falls at home. Pt overall Mod A for bed mobility with noted difficulty maintaining sitting balance EOB. Pt overall Min A for sit to stand and Mod A for stand pivot to recliner using RW. Pt with posterior bias and required hands-on assist consistently when standing to prevent LOB/fall. Pt noted with +orthstatic BP reading in standing but denied feeling dizziness. Pt requires Supervision for UB ADLs and up to Max A for LB ADLs due to deficits. At this time, recommend SNF for short term rehab. However, pt may be able to progress with acute therapy services to go home with [facility] services. Will progress mobility, balance during ADLs in next session and update recommendations as appropriate.  Sitting: 113/96 Standing: 96/68 After transfer: 125/68    Follow Up Recommendations  SNF;Supervision/Assistance - 24 hour;Other (comment) (vs HHOT pending progress)    Equipment Recommendations  Other (comment) (RW)    Recommendations for Other Services       Precautions / Restrictions Precautions Precautions:  Fall;Other (comment) Precaution Comments: monitor BP Restrictions Weight Bearing Restrictions: No      Mobility Bed Mobility Overal bed mobility: Needs Assistance Bed Mobility: Supine to Sit     Supine to sit: Mod assist;HOB elevated     General bed mobility comments: Mod A overall. Heavy assist needed to advance trunk to EOB    Transfers Overall transfer level: Needs assistance Equipment used: Rolling walker (2 wheeled) Transfers: Sit to/from [facility] Sit to Stand: Min assist Stand pivot transfers: Mod assist       General transfer comment: Min A for power up from bedside, difficulty with carryover of hand placement cues. Noted with posterior bias and hands on assist needed at all times to prevent LOB backwards. Pt Mod A for pivot to chair, light assist for RW manuevering and hands on assist needed to maintain balance during all movements    Balance Overall balance assessment: Needs assistance Sitting-balance support: Feet supported;Single extremity supported;No upper extremity supported Sitting balance-Leahy Scale: Poor Sitting balance - Comments: reliant on UE support EOB, noted dificulty maintaining sitting balance EOB - posterior lean noted too Postural control: Posterior lean Standing balance support: Bilateral upper extremity supported;During functional activity Standing balance-Leahy Scale: Poor Standing balance comment: reliant on UE support and external support from therapist                           ADL either performed or assessed with clinical judgement   ADL Overall ADL's : Needs assistance/impaired Eating/Feeding: Set up;Sitting   Grooming: Supervision/safety;Sitting   Upper Body Bathing: Supervision/ safety;Sitting   Lower Body Bathing: Moderate assistance;Sit to/from stand   Upper  Body Dressing : Supervision/safety;Sitting   Lower Body Dressing: Maximal assistance;Sit to/from stand Lower Body Dressing Details  (indicate cue type and reason): Pt unable to don socks citing B knee pain, requesting assist from OT. Will need hands on assist if standing to don pants around waist due to posterior bias Toilet Transfer: Moderate assistance;Stand-pivot;RW Toilet Transfer Details (indicate cue type and reason): simulated to recliner Toileting- Clothing Manipulation and Hygiene: Moderate assistance;Sitting/lateral lean;Sit to/from stand         General ADL Comments: Pt limited by posterior bias in standing, requiring hands on assist to maintain standing balance. Noted with orthostatic BP readings but denied dizziness     Vision Patient Visual Report: No change from baseline Vision Assessment?: No apparent visual deficits     Perception     Praxis      Pertinent Vitals/Pain Pain Assessment: No/denies pain     Hand Dominance Right   Extremity/Trunk Assessment Upper Extremity Assessment Upper Extremity Assessment: Overall WFL for tasks assessed   Lower Extremity Assessment Lower Extremity Assessment: Defer to PT evaluation   Cervical / Trunk Assessment Cervical / Trunk Assessment: Normal   Communication Communication Communication: No difficulties   Cognition Arousal/Alertness: Awake/alert Behavior During Therapy: WFL for tasks assessed/performed Overall Cognitive Status: No family/caregiver present to determine baseline cognitive functioning Area of Impairment: Orientation;Attention;Memory;Following commands;Safety/judgement;Problem solving;Awareness                 Orientation Level: Disoriented to;Situation Current Attention Level: Selective Memory: Decreased short-term memory Following Commands: Follows one step commands with increased time Safety/Judgement: Decreased awareness of safety;Decreased awareness of deficits Awareness: Emergent Problem Solving: Requires verbal cues;Difficulty sequencing;Slow processing General Comments: Pt pleasant and cooperative, some difficulty  noted with problem solving, following cues for hand placement during transfers, etc. Unsure of baseline cognitive status. Unaware of balance deficits in standing posing high fall risk   General Comments  Pt received in bed, per NT - BP readings WFL lying in bed (120s/80s). Pt noted with orthostatic BP reading once standing but denied dizziness with BP normalizing once sitting in chair. Pt on RA    Exercises     Shoulder Instructions      Home Living Family/patient expects to be discharged to:: Private residence Living Arrangements: Spouse/significant other Available Help at Discharge: Family Type of Home: House Home Access: Stairs to enter [employer] of Steps: 3 in front, 2 at side, 4 in back Entrance Stairs-Rails: Right (uses front the most with one railing to hold to) Home Layout: One level     Bathroom Shower/Tub: Walk-in shower         Home Equipment: Environmental consultant - 4 wheels;Cane - single point;Shower seat          Prior Functioning/Environment Level of Independence: Needs assistance  Gait / Transfers Assistance Needed: Pt reports not typically needing AD for mobility, endorses frequent falls (pt unsure what causes falls but when asked about lightheadedness - pt endorses this as a reason for falls ADL's / Homemaking Assistance Needed: Pt reports typically Independent with ADLs, but has been requiring some assistance from wife for LB ADLs (putting socks on, etc) due to B knee pain            OT Problem List: Decreased strength;Decreased activity tolerance;Impaired balance (sitting and/or standing);Decreased cognition;Decreased safety awareness;Decreased knowledge of use of DME or AE      OT Treatment/Interventions: Self-care/ADL training;Therapeutic exercise;DME and/or AE instruction;Therapeutic activities;Patient/family education;Balance training    OT Goals(Current goals can be found  in the care plan section) Acute Rehab OT Goals Patient Stated Goal: be able  to get out of bed OT Goal Formulation: With patient Time For Goal Achievement: 05/17/20 Potential to Achieve Goals: Good ADL Goals Pt Will Perform Grooming: with modified independence;standing Pt Will Perform Lower Body Bathing: with supervision;sitting/lateral leans;sit to/from stand Pt Will Transfer to Toilet: with supervision;ambulating Pt Will Perform Toileting - Clothing Manipulation and hygiene: with modified independence;sit to/from stand;sitting/lateral leans Additional ADL Goal #1: Pt to verbalize at least 2 fall prevention strategies to maximize safety at home  OT Frequency: Min 2X/week   Barriers to D/C:            Co-evaluation              AM-PAC OT "6 Clicks" Daily Activity     Outcome Measure Help from another person eating meals?: A Little Help from another person taking care of personal grooming?: A Little Help from another person toileting, which includes using toliet, bedpan, or urinal?: A Lot Help from another person bathing (including washing, rinsing, drying)?: A Lot Help from another person to put on and taking off regular upper body clothing?: A Little Help from another person to put on and taking off regular lower body clothing?: A Lot 6 Click Score: 15   End of Session Equipment Utilized During Treatment: Gait belt;Rolling walker Nurse Communication: Mobility status;Other (comment) (BP)  Activity Tolerance: Patient tolerated treatment well Patient left: in chair;with call bell/phone within reach;with nursing/sitter in room  OT Visit Diagnosis: Unsteadiness on feet (R26.81);Other abnormalities of gait and mobility (R26.89);Muscle weakness (generalized) (M62.81);Other symptoms and signs involving cognitive function                Time: [id number] OT Time Calculation (min): 24 min Charges:  OT General Charges $OT Visit: 1 Visit OT Evaluation $OT Eval Moderate Complexity: 1 Mod OT Treatments $Self Care/Home Management : 8-22 mins  Layla Maw,  OTR/L  Layla Maw 05/03/2020, 11:13 AM

## 2020-05-04 DIAGNOSIS — R578 Other shock: Secondary | ICD-10-CM | POA: Diagnosis not present

## 2020-05-04 DIAGNOSIS — I8501 Esophageal varices with bleeding: Secondary | ICD-10-CM | POA: Diagnosis not present

## 2020-05-04 DIAGNOSIS — K7460 Unspecified cirrhosis of liver: Secondary | ICD-10-CM | POA: Diagnosis not present

## 2020-05-04 DIAGNOSIS — D62 Acute posthemorrhagic anemia: Secondary | ICD-10-CM | POA: Diagnosis not present

## 2020-05-04 LAB — COMPREHENSIVE METABOLIC PANEL
ALT: 92 U/L — ABNORMAL HIGH (ref 0–44)
AST: 32 U/L (ref 15–41)
Albumin: 2.2 g/dL — ABNORMAL LOW (ref 3.5–5.0)
Alkaline Phosphatase: 62 U/L (ref 38–126)
Anion gap: 7 (ref 5–15)
BUN: 29 mg/dL — ABNORMAL HIGH (ref 8–23)
CO2: 27 mmol/L (ref 22–32)
Calcium: 8.3 mg/dL — ABNORMAL LOW (ref 8.9–10.3)
Chloride: 103 mmol/L (ref 98–111)
Creatinine, Ser: 1.57 mg/dL — ABNORMAL HIGH (ref 0.61–1.24)
GFR, Estimated: 47 mL/min — ABNORMAL LOW (ref >60)
Glucose, Bld: 201 mg/dL — ABNORMAL HIGH (ref 70–99)
Potassium: 3.9 mmol/L (ref 3.5–5.1)
Sodium: 137 mmol/L (ref 135–145)
Total Bilirubin: 0.9 mg/dL (ref 0.3–1.2)
Total Protein: 5.3 g/dL — ABNORMAL LOW (ref 6.5–8.1)

## 2020-05-04 LAB — CBC
HCT: 23 % — ABNORMAL LOW (ref 39.0–52.0)
Hemoglobin: 7.6 g/dL — ABNORMAL LOW (ref 13.0–17.0)
MCH: 30.9 pg (ref 26.0–34.0)
MCHC: 33 g/dL (ref 30.0–36.0)
MCV: 93.5 fL (ref 80.0–100.0)
Platelets: 49 10*3/uL — ABNORMAL LOW (ref 150–400)
RBC: 2.46 MIL/uL — ABNORMAL LOW (ref 4.22–5.81)
RDW: 15.4 % (ref 11.5–15.5)
WBC: 3.8 10*3/uL — ABNORMAL LOW (ref 4.0–10.5)
nRBC: 2.1 % — ABNORMAL HIGH (ref 0.0–0.2)

## 2020-05-04 LAB — GLUCOSE, CAPILLARY
Glucose-Capillary: 139 mg/dL — ABNORMAL HIGH (ref 70–99)
Glucose-Capillary: 147 mg/dL — ABNORMAL HIGH (ref 70–99)
Glucose-Capillary: 184 mg/dL — ABNORMAL HIGH (ref 70–99)
Glucose-Capillary: 269 mg/dL — ABNORMAL HIGH (ref 70–99)
Glucose-Capillary: 295 mg/dL — ABNORMAL HIGH (ref 70–99)
Glucose-Capillary: 314 mg/dL — ABNORMAL HIGH (ref 70–99)

## 2020-05-04 LAB — PROTIME-INR
INR: 1.2 (ref 0.8–1.2)
Prothrombin Time: 14.4 seconds (ref 11.4–15.2)

## 2020-05-04 LAB — AMMONIA: Ammonia: 21 umol/L (ref 9–35)

## 2020-05-04 NOTE — Progress Notes (Signed)
PROGRESS NOTE                                                                                                                                                                                                             Patient Demographics:    Corey Wood, is a 70 y.o. male, DOB - 28-Mar-1951, KGU:[id number]  Outpatient Primary MD for the patient is [name], MD   Admit date - 04/29/2020   LOS - 5  Chief Complaint  Patient presents with  . Hematemesis       Brief Narrative: Patient is a 70 y.o. male with PMHx of [name] cirrhosis-with recent history of upper GI bleeding on 1/20 requiring EGD with banding-returned to the hospital (1/28-1/30) with recurrent upper GI bleeding-repeat EGD with concern for bleeding from a banded ulcer-presented to the hospital on 2/1 with massive upper GI bleeding with hypovolemic shock.  Patient was resuscitated-transferred in the ICU-required repeat EGD and banding-subsequently stabilized-and transfer to [facility].  See below for further details.  COVID-19 vaccinated status: Vaccinated and boosted   Significant Events: 1/20>>outpatient EGD with banding of large esophageal varices  1/28-1/30>> upper GI bleeding-hospitalized-EGD on 1/28-no stigmata of bleeding-banded ulcer 2/1>> recurrent upper GI bleeding with hemorrhagic shock-admit to ICU 2/2>> EGD: Multiple post banding ulcers-actively oozing blood-s/p 6 bands, blood in duodenum/stomach. 2/4>> transfer to [facility]  Significant studies: 2/3>> Echo: EF 62-83%, RV systolic function is low normal 2/3>> liver Doppler: No splenic varices, no portal vein occlusion/thrombus  COVID-19 medications:   Antibiotics: Rocephin: 2/1>>  Microbiology data: None  Procedures: 1/20>> EGD 1/28>> EGD 2/1>> EGD  Consults: GI, PCCM, IR  DVT prophylaxis: SCDs Start: 04/29/20 0811     Subjective:   No major issues  overnight-patient without any major complaints-lying comfortably in bed.  Denies any chest pain or shortness of breath.  Per RN-patient had dark but brown-colored stools earlier this morning.   Assessment  & Plan :   Upper GI bleedingdue to esophageal varices-with acute blood loss anemia and hemorrhagic shock: Hemorrhagic shock resolved-hemoglobin relatively stable after multiple units of PRBC transfusion-dark-colored stool today-given stability of hemoglobin-likely reflective of old blood.  Continue to follow closely-follow CBC-GI/IR following-with plans to proceed with TIPS procedure if bleeding reoccurs.  Transfuse PRBC if patient actively starts bleeding or if hemoglobin less than 7.  On prophylactic  Rocephin.  Completed octreotide/Protonix infusion-now on twice daily dosing of octreotide and Carafate.  AKI on CKD stage IIIa: AKI hemodynamically mediated-due to hemorrhagic shock-attending slowly improving and now close to baseline.  Per patient/family patient has a nonfunctioning left kidney.    Acute metabolic encephalopathy: Due to hypotension/AKI-improved-encephalopathy has resolved.    History of [name] with liver cirrhosis: Relatively well compensated with the exception for variceal bleeding  Myelodysplastic syndrome: Follow CBC closely and transfuse as needed  Thrombocytopenia: Likely secondary to hypersplenism-has been relatively stable over the past few days.  Follow.  COPD/OSA: Stable-continue bronchodilators  BPH: Continue Flomax  Depression: Stable-continue trazodone, Celexa and Wellbutrin  HLD: Continue to hold statin-LFTs elevated  HTN: Continue to hold losartan-BP stable-resume when able  DM-2: CBGs stable-continue SSI  Recent Labs    05/04/20 0442 05/04/20 0815 05/04/20 1202  GLUCAP 147* 139* 314*   COVID-19 infection: Asymptomatic-fully vaccinated and boosted-continue to watch closely.  Debility/deconditioning: Due to acute illness-evaluated by  PT-recommendations of SNF.  Will place social work consult today.    ABG:    Component Value Date/Time   PHART 7.454 (H) 04/29/2020 1625   PCO2ART 36.7 04/29/2020 1625   PO2ART 376 (H) 04/29/2020 1625   HCO3 25.8 04/29/2020 1625   TCO2 27 04/29/2020 1625   O2SAT 100.0 04/29/2020 1625    Vent Settings: N/A    Condition - Extremely Guarded  Family Communication  :  Spouse-Sharon-[phone]  on 2/6  Code Status :  Full Code  Diet :  Diet Order            Diet full liquid Room service appropriate? Yes; Fluid consistency: Thin  Diet effective now                  Disposition Plan  :   Status is: Inpatient  Remains inpatient appropriate because:Inpatient level of care appropriate due to severity of illness   Dispo: The patient is from: Home              Anticipated d/c is to: Home              Anticipated d/c date is: 2 days              Patient currently is not medically stable to d/c.   Difficult to place patient No   Barriers to discharge: Severe upper GI bleeding due to variceal bleeding-at high risk for rebleeding-needs continued inpatient monitoring  Antimicorbials  :    Anti-infectives (From admission, onward)   Start     Dose/Rate Route Frequency Ordered Stop   04/30/20 0930  cefTRIAXone (ROCEPHIN) 1 g in sodium chloride 0.9 % 100 mL IVPB        1 g 200 mL/hr over 30 Minutes Intravenous Every 24 hours 04/30/20 0847 05/06/20 0929   04/30/20 0800  cefTRIAXone (ROCEPHIN) 1 g in sodium chloride 0.9 % 100 mL IVPB  Status:  Discontinued        1 g 200 mL/hr over 30 Minutes Intravenous Every 24 hours 04/29/20 0922 04/30/20 0847   04/29/20 0730  cefTRIAXone (ROCEPHIN) 1 g in sodium chloride 0.9 % 100 mL IVPB        1 g 200 mL/hr over 30 Minutes Intravenous  Once 04/29/20 2951 04/29/20 8841      Inpatient Medications  Scheduled Meds: . (feeding supplement) PROSource Plus  30 mL Oral BID BM  . buPROPion  150 mg Oral Daily  . chlorhexidine gluconate  (MEDLINE KIT)  15 mL Mouth Rinse BID  . Chlorhexidine Gluconate Cloth  6 each Topical Q0600  . citalopram  40 mg Oral Daily  . feeding supplement (GLUCERNA SHAKE)  237 mL Oral TID BM  . fluticasone  2 spray Each Nare Daily  . insulin aspart  0-20 Units Subcutaneous Q4H  . ipratropium  2 spray Each Nare QHS  . ketotifen  1 drop Both Eyes QHS  . multivitamin with minerals  1 tablet Oral Daily  . pantoprazole  40 mg Intravenous Q12H  . sodium chloride flush  10-40 mL Intracatheter Q12H  . sucralfate  1 g Oral Q6H  . tamsulosin  0.4 mg Oral BID  . traZODone  75 mg Oral QHS  . vitamin B-12  500 mcg Oral QODAY   Continuous Infusions: . cefTRIAXone (ROCEPHIN)  IV 1 g (05/04/20 0937)  . ferumoxytol Stopped (04/30/20 1047)   PRN Meds:.albuterol, oxyCODONE, sodium chloride flush   Time Spent in minutes  25  See all Orders from today for further details   Oren Binet M.D on 05/04/2020 at 2:08 PM  To page go to [email or web address] - use universal password  [facility] -  Office  [phone]    Objective:   Vitals:   05/03/20 0910 05/03/20 2158 05/04/20 0500 05/04/20 0629  BP: 122/65 118/62  112/65  Pulse: 69 64  (!) 51  Resp: '17 16  16  ' Temp: 97.8 F (36.6 C) 98.6 F (37 C)  98 F (36.7 C)  TempSrc: Oral     SpO2: 97% 95%  96%  Weight:   88.7 kg   Height:        Wt Readings from Last 3 Encounters:  05/04/20 88.7 kg  04/25/20 77.1 kg  04/17/20 77.1 kg    No intake or output data in the 24 hours ending 05/04/20 1408   Physical Exam Gen Exam:Alert awake-not in any distress HEENT:atraumatic, normocephalic Chest: B/L clear to auscultation anteriorly CVS:S1S2 regular Abdomen:soft non tender, non distended Extremities:no edema Neurology: Non focal Skin: no rash   Data Review:    CBC Recent Labs  Lab 05/01/20 1135 05/02/20 0708 05/02/20 1633 05/03/20 0203 05/04/20 0206  WBC 3.7* 3.0* 3.4* 3.7* 3.8*  HGB 7.6* 7.3* 8.1* 7.2* 7.6*  HCT 23.1* 22.5* 24.9*  21.7* 23.0*  PLT 33* 38* 40* 40* 49*  MCV 93.5 94.5 95.0 93.1 93.5  MCH 30.8 30.7 30.9 30.9 30.9  MCHC 32.9 32.4 32.5 33.2 33.0  RDW 15.3 15.5 15.4 15.1 15.4    Chemistries  Recent Labs  Lab 04/29/20 0815 04/29/20 1625 04/30/20 0457 05/02/20 0708 05/02/20 1715 05/03/20 0203 05/04/20 0206  NA 135 138 136 139  --  136 137  K 4.8 5.4* 4.1 4.0  --  4.0 3.9  CL 104  --  104 108  --  103 103  CO2 19*  --  20* 25  --  26 27  GLUCOSE 464*  --  287* 134* 410* 169* 201*  BUN 26*  --  34* 34*  --  33* 29*  CREATININE 2.38*  --  2.14* 1.78*  --  1.61* 1.57*  CALCIUM 7.1*  --  7.7* 8.4*  --  8.2* 8.3*  AST 40  --  88* 84*  --  45* 32  ALT 30  --  77* 179*  --  129* 92*  ALKPHOS 64  --  48 50  --  58 62  BILITOT 0.8  --  1.0 0.7  --  0.6  0.9   ------------------------------------------------------------------------------------------------------------------ No results for input(s): CHOL, HDL, LDLCALC, TRIG, CHOLHDL, LDLDIRECT in the last 72 hours.  Lab Results  Component Value Date   HGBA1C 6.6 (H) 04/29/2020   ------------------------------------------------------------------------------------------------------------------ No results for input(s): TSH, T4TOTAL, T3FREE, THYROIDAB in the last 72 hours.  Invalid input(s): FREET3 ------------------------------------------------------------------------------------------------------------------ No results for input(s): VITAMINB12, FOLATE, FERRITIN, TIBC, IRON, RETICCTPCT in the last 72 hours.  Coagulation profile Recent Labs  Lab 04/29/20 0815 04/30/20 0457 05/04/20 0206  INR 1.7* 1.5* 1.2    No results for input(s): DDIMER in the last 72 hours.  Cardiac Enzymes No results for input(s): CKMB, TROPONINI, MYOGLOBIN in the last 168 hours.  Invalid input(s): CK ------------------------------------------------------------------------------------------------------------------ No results found for: BNP  Micro Results Recent  Results (from the past 240 hour(s))  MRSA PCR Screening     Status: None   Collection Time: 04/29/20 12:54 PM   Specimen: Nasopharyngeal  Result Value Ref Range Status   MRSA by PCR NEGATIVE NEGATIVE Final    Comment:        The GeneXpert MRSA Assay (FDA approved for NASAL specimens only), is one component of a comprehensive MRSA colonization surveillance program. It is not intended to diagnose MRSA infection nor to guide or monitor treatment for MRSA infections. Performed at [facility], [place] [address], [place], [place] [address]     Radiology Reports DG CHEST PORT 1 VIEW  Result Date: 04/29/2020 CLINICAL DATA:  Central line placement EXAM: PORTABLE CHEST 1 VIEW COMPARISON:  04/29/2020 chest radiograph. FINDINGS: Right internal jugular central venous catheter terminates in lower third of the SVC. Left internal jugular central venous catheter terminates in high left mediastinum at the superior margin of the aortic arch in an indeterminate position. Pacer pads overlie chest bilaterally. Stable cardiomediastinal silhouette with top-normal heart size. No pneumothorax. Stable small left pleural effusion. No right pleural effusion. No pulmonary edema. Stable mild left basilar hazy opacity, favor atelectasis. IMPRESSION: 1. Right internal jugular central venous catheter terminates in the lower third of the SVC. 2. Left internal jugular central venous catheter terminates in the high left mediastinum at the superior margin of the aortic arch in an indeterminate position, suspicious for arterial placement as previously mentioned. 3. Stable small left pleural effusion and mild left basilar hazy opacity, favor atelectasis. Critical Value/emergent results were called by telephone at the time of interpretation on 04/29/2020 at 11:12 am to provider DR. CHANDS, who verbally acknowledged these results. The left neck catheter has been removed by the time of this interpretation. Electronically Signed   By:  Ilona Sorrel M.D.   On: 04/29/2020 11:13   DG Chest Port 1 View  Result Date: 04/29/2020 CLINICAL DATA:  Redressed mint of central line. EXAM: PORTABLE CHEST 1 VIEW COMPARISON:  04/27/2020 FINDINGS: Left internal jugular central venous catheter with the tip kinked and projecting at the superior aspect of the aortic arch. Similar cardiomediastinal silhouette. Similar small left pleural effusion with overlying opacities. No visible pneumothorax on this limited supine radiograph. IMPRESSION: 1. Left internal jugular central venous catheter with the tip kinked and projecting at the superior aspect of the aortic arch. While this could represent a venous placement, arterial placement cannot be excluded on this image. Recommend correlation with blood gas analysis. 2. Similar small left pleural effusion with overlying opacities, which could represent atelectasis, aspiration, and/or pneumonia. Findings and recommendations discussed with Dr. Christy Gentles via telephone at 8:34 a.m. Electronically Signed   By: Margaretha Sheffield MD   On: 04/29/2020  08:38   DG Chest Portable 1 View  Result Date: 04/29/2020 CLINICAL DATA:  Line placement EXAM: PORTABLE CHEST 1 VIEW COMPARISON:  April 26, 2020 FINDINGS: The cardiomediastinal silhouette is unchanged and mildly enlarged in contour.LEFT IJ CVC tip terminates over the expected region of the LEFT brachiocephalic vein confluence with the subclavian. Small LEFT pleural effusion, unchanged. No pneumothorax. LEFT retrocardiac opacity, possibly atelectasis. Visualized abdomen is unremarkable. No acute osseous abnormality. IMPRESSION: LEFT IJ CVC tip terminates over the expected region of the LEFT brachiocephalic vein confluence with the subclavian. No pneumothorax. Electronically Signed   By: Valentino Saxon MD   On: 04/29/2020 07:54   DG CHEST PORT 1 VIEW  Result Date: 04/26/2020 CLINICAL DATA:  Hemoptysis EXAM: PORTABLE CHEST 1 VIEW COMPARISON:  11/06/2019 FINDINGS: Small left  pleural effusion. Generous heart size accentuated by technique. No air bronchogram or pulmonary edema. No pneumothorax. Extensive artifact from EKG leads. IMPRESSION: Small left pleural effusion. Electronically Signed   By: Monte Fantasia M.D.   On: 04/26/2020 07:50   DG Foot 2 Views Left  Result Date: 04/29/2020 CLINICAL DATA:  Fall. EXAM: LEFT FOOT - 2 VIEW COMPARISON:  None. FINDINGS: No acute fracture or dislocation is identified. There are large posterior and plantar calcaneal enthesophytes. There is the suggestion of soft tissue swelling at the medial aspect of the ankle. Atherosclerotic vascular calcifications are noted. IMPRESSION: No acute osseous abnormality identified. Electronically Signed   By: Logan Bores M.D.   On: 04/29/2020 13:20   US LIVER DOPPLER  Result Date: 05/02/2020 CLINICAL DATA:  Cirrhosis EXAM: DUPLEX ULTRASOUND OF LIVER TECHNIQUE: Color and duplex Doppler ultrasound was performed to evaluate the hepatic in-flow and out-flow vessels. COMPARISON:  CT abdomen pelvis 11/07/2019 FINDINGS: Liver: Normal parenchymal echogenicity. Normal hepatic contour without nodularity. No focal lesion, mass or intrahepatic biliary ductal dilatation. Main Portal Vein size: 1.3 cm Portal Vein Velocities Main Prox:  34 cm/sec Main Mid: 22 cm/sec Main Dist:  21 cm/sec Right: 22 cm/sec Left: 17 cm/sec Hepatic Vein Velocities Right:  20 cm/sec Middle:  17 cm/sec Left:  18 cm/sec IVC: Present and patent with normal respiratory phasicity. Hepatic Artery Velocity:  98 cm/sec Splenic Vein Velocity:  13 cm/sec Spleen: 18.6 x 10.1 x 7.1 cm with a total volume of 695 cm^3 (411 cm^3 is upper limit normal) Portal Vein Occlusion/Thrombus: No Splenic Vein Occlusion/Thrombus: No Ascites: None Varices: None Hydronephrotic a atrophic left kidney partially visualized. IMPRESSION: No definite splenic varices identified. The spleen is enlarged. Splenic vein is patent. If there is continued concern for splenic varices,  further evaluation with contrast enhanced abdominal CT should be performed. Electronically Signed   By: Miachel Roux M.D.   On: 05/02/2020 08:11   ECHOCARDIOGRAM LIMITED  Result Date: 05/01/2020    ECHOCARDIOGRAM REPORT   Patient Name:   RIZWAN KUYPER Date of Exam: 05/01/2020 Medical Rec #:  [id number]      Height:       67.0 in Accession #:    [id number]     Weight:       190.7 lb Date of Birth:  1950-10-25      BSA:          1.982 m Patient Age:    33 years       BP:           141/66 mmHg Patient Gender: M              HR:  63 bpm. Exam Location:  Inpatient Procedure: Limited Echo, Limited Color Doppler and Cardiac Doppler Indications:    esophageal varices  History:        Patient has no prior history of Echocardiogram examinations.                 Covid; Risk Factors:Diabetes.  Sonographer:    Johny Chess Referring Phys: [id number] [place]  1. Left ventricular ejection fraction, by estimation, is 60 to 65%. The left ventricle has normal function. The left ventricle has no regional wall motion abnormalities. Left ventricular diastolic parameters were normal.  2. Right ventricular systolic function is low normal. The right ventricular size is mildly enlarged. There is normal pulmonary artery systolic pressure.  3. The mitral valve is normal in structure. Trivial mitral valve regurgitation.  4. The aortic valve is abnormal. Aortic valve regurgitation is mild. Mild to moderate aortic valve sclerosis/calcification is present, without any evidence of aortic stenosis.  5. Aortic dilatation noted. There is mild dilatation of the ascending aorta, measuring 39 mm.  6. The inferior vena cava is normal in size with greater than 50% respiratory variability, suggesting right atrial pressure of 3 mmHg. FINDINGS  Left Ventricle: Left ventricular ejection fraction, by estimation, is 60 to 65%. The left ventricle has normal function. The left ventricle has no regional wall motion abnormalities.  The left ventricular internal cavity size was normal in size. There is  no left ventricular hypertrophy. Left ventricular diastolic parameters were normal. Right Ventricle: The right ventricular size is mildly enlarged. Right vetricular wall thickness was not assessed. Right ventricular systolic function is low normal. There is normal pulmonary artery systolic pressure. The tricuspid regurgitant velocity is  2.48 m/s, and with an assumed right atrial pressure of 3 mmHg, the estimated right ventricular systolic pressure is 60.7 mmHg. Left Atrium: Left atrial size was normal in size. Right Atrium: Right atrial size was normal in size. Pericardium: There is no evidence of pericardial effusion. Mitral Valve: The mitral valve is normal in structure. There is mild thickening of the mitral valve leaflet(s). Trivial mitral valve regurgitation. Tricuspid Valve: The tricuspid valve is normal in structure. Tricuspid valve regurgitation is mild. Aortic Valve: The aortic valve is abnormal. Aortic valve regurgitation is mild. Aortic regurgitation PHT measures 679 msec. Mild to moderate aortic valve sclerosis/calcification is present, without any evidence of aortic stenosis. Aortic valve mean gradient measures 12.0 mmHg. Aortic valve peak gradient measures 20.1 mmHg. Aortic valve area, by VTI measures 2.11 cm. Pulmonic Valve: The pulmonic valve was normal in structure. Pulmonic valve regurgitation is not visualized. Aorta: Aortic dilatation noted. There is mild dilatation of the ascending aorta, measuring 39 mm. Venous: The inferior vena cava is normal in size with greater than 50% respiratory variability, suggesting right atrial pressure of 3 mmHg. IAS/Shunts: The interatrial septum was not assessed.  LEFT VENTRICLE PLAX 2D LVIDd:         5.10 cm  Diastology LVIDs:         3.20 cm  LV e' medial:   8.38 cm/s LV PW:         1.00 cm  LV E/e' medial: 8.3 LV IVS:        0.90 cm LVOT diam:     2.00 cm LV SV:         99 LV SV Index:    50 LVOT Area:     3.14 cm  IVC IVC diam: 1.70 cm LEFT ATRIUM  Index LA diam:    3.80 cm 1.92 cm/m  AORTIC VALVE AV Area (Vmax):    1.92 cm AV Area (Vmean):   1.76 cm AV Area (VTI):     2.11 cm AV Vmax:           224.00 cm/s AV Vmean:          163.000 cm/s AV VTI:            0.470 m AV Peak Grad:      20.1 mmHg AV Mean Grad:      12.0 mmHg LVOT Vmax:         137.00 cm/s LVOT Vmean:        91.300 cm/s LVOT VTI:          0.316 m LVOT/AV VTI ratio: 0.67 AI PHT:            679 msec  AORTA Ao Root diam: 3.20 cm Ao Asc diam:  3.90 cm MITRAL VALVE               TRICUSPID VALVE MV Area (PHT): 2.76 cm    TR Peak grad:   24.6 mmHg MV Decel Time: 275 msec    TR Vmax:        248.00 cm/s MV E velocity: 69.80 cm/s MV A velocity: 82.70 cm/s  SHUNTS MV E/A ratio:  0.84        Systemic VTI:  0.32 m                            Systemic Diam: 2.00 cm Dorris Carnes MD Electronically signed by Dorris Carnes MD Signature Date/Time: 05/01/2020/7:58:50 PM    Final

## 2020-05-05 ENCOUNTER — Other Ambulatory Visit (HOSPITAL_COMMUNITY): Payer: Self-pay | Admitting: Internal Medicine

## 2020-05-05 DIAGNOSIS — I8511 Secondary esophageal varices with bleeding: Secondary | ICD-10-CM

## 2020-05-05 DIAGNOSIS — I8501 Esophageal varices with bleeding: Secondary | ICD-10-CM

## 2020-05-05 DIAGNOSIS — R578 Other shock: Secondary | ICD-10-CM | POA: Diagnosis not present

## 2020-05-05 DIAGNOSIS — K922 Gastrointestinal hemorrhage, unspecified: Secondary | ICD-10-CM | POA: Diagnosis not present

## 2020-05-05 DIAGNOSIS — K7460 Unspecified cirrhosis of liver: Secondary | ICD-10-CM

## 2020-05-05 DIAGNOSIS — K920 Hematemesis: Secondary | ICD-10-CM | POA: Diagnosis not present

## 2020-05-05 LAB — CBC
HCT: 21 % — ABNORMAL LOW (ref 39.0–52.0)
Hemoglobin: 7.3 g/dL — ABNORMAL LOW (ref 13.0–17.0)
MCH: 32.4 pg (ref 26.0–34.0)
MCHC: 34.8 g/dL (ref 30.0–36.0)
MCV: 93.3 fL (ref 80.0–100.0)
Platelets: 55 10*3/uL — ABNORMAL LOW (ref 150–400)
RBC: 2.25 MIL/uL — ABNORMAL LOW (ref 4.22–5.81)
RDW: 16.3 % — ABNORMAL HIGH (ref 11.5–15.5)
WBC: 3.7 10*3/uL — ABNORMAL LOW (ref 4.0–10.5)
nRBC: 1.1 % — ABNORMAL HIGH (ref 0.0–0.2)

## 2020-05-05 LAB — COMPREHENSIVE METABOLIC PANEL
ALT: 66 U/L — ABNORMAL HIGH (ref 0–44)
AST: 27 U/L (ref 15–41)
Albumin: 2.2 g/dL — ABNORMAL LOW (ref 3.5–5.0)
Alkaline Phosphatase: 63 U/L (ref 38–126)
Anion gap: 6 (ref 5–15)
BUN: 25 mg/dL — ABNORMAL HIGH (ref 8–23)
CO2: 26 mmol/L (ref 22–32)
Calcium: 8.3 mg/dL — ABNORMAL LOW (ref 8.9–10.3)
Chloride: 103 mmol/L (ref 98–111)
Creatinine, Ser: 1.56 mg/dL — ABNORMAL HIGH (ref 0.61–1.24)
GFR, Estimated: 47 mL/min — ABNORMAL LOW (ref >60)
Glucose, Bld: 165 mg/dL — ABNORMAL HIGH (ref 70–99)
Potassium: 3.6 mmol/L (ref 3.5–5.1)
Sodium: 135 mmol/L (ref 135–145)
Total Bilirubin: 0.5 mg/dL (ref 0.3–1.2)
Total Protein: 5.3 g/dL — ABNORMAL LOW (ref 6.5–8.1)

## 2020-05-05 LAB — GLUCOSE, CAPILLARY
Glucose-Capillary: 140 mg/dL — ABNORMAL HIGH (ref 70–99)
Glucose-Capillary: 140 mg/dL — ABNORMAL HIGH (ref 70–99)
Glucose-Capillary: 167 mg/dL — ABNORMAL HIGH (ref 70–99)
Glucose-Capillary: 179 mg/dL — ABNORMAL HIGH (ref 70–99)

## 2020-05-05 MED ORDER — PANTOPRAZOLE SODIUM 40 MG PO TBEC: 40.0000 mg | DELAYED_RELEASE_TABLET | Freq: Two times a day (BID) | ORAL | 0 refills | Status: DC

## 2020-05-05 MED ORDER — PANTOPRAZOLE SODIUM 40 MG PO TBEC: 40.0000 mg | DELAYED_RELEASE_TABLET | Freq: Two times a day (BID) | ORAL | Status: DC

## 2020-05-05 MED ORDER — FERROUS SULFATE 325 (65 FE) MG PO TABS: 325.0000 mg | ORAL_TABLET | Freq: Two times a day (BID) | ORAL | 0 refills | Status: DC

## 2020-05-05 MED FILL — PANTOPRAZOLE SOD DR 40 MG T: 40 | 14 days supply | Qty: 28 | Fill #0

## 2020-05-05 MED FILL — FERROUS SULFATE 325 MG TAB: 325 (65 FE) | 30 days supply | Qty: 60 | Fill #0

## 2020-05-05 NOTE — Discharge Summary (Signed)
PATIENT DETAILS Name: Corey Wood Age: 70 y.o. Sex: male Date of Birth: 23-Jan-1951 MRN: [id number]. Admitting Physician: Jacky Kindle, MD [id number], Junious Dresser, MD  Admit Date: 04/29/2020 Discharge date: 05/05/2020  Recommendations for Outpatient Follow-up:  1. Follow up with PCP in 1-2 weeks 2. Please obtain CMP/CBC in one week 3. BP medications on hold-resume when able 4. Please ensure follow up with GI MD  Admitted From:  Home  Disposition: Home with home health services    Home Health: Yes  Equipment/Devices: None  Discharge Condition: Stable  CODE STATUS: FULL CODE  Diet recommendation:  Diet Order            DIET SOFT Room service appropriate? Yes; Fluid consistency: Thin  Diet effective now           Diet - low sodium heart healthy                  Brief Summary: Brief Narrative: Patient is a 70 y.o. male with PMHx of [name] cirrhosis-with recent history of upper GI bleeding on 1/20 requiring EGD with banding-returned to the hospital (1/28-1/30) with recurrent upper GI bleeding-repeat EGD with concern for bleeding from a banded ulcer-presented to the hospital on 2/1 with massive upper GI bleeding with hypovolemic shock.  Patient was resuscitated-transferred in the ICU-required repeat EGD and banding-subsequently stabilized-and transfer to the [facility].  See below for further details.  COVID-19 vaccinated status: Vaccinated and boosted   Significant Events: 1/20>>outpatient EGD with banding of large esophageal varices  1/28-1/30>> upper GI bleeding-hospitalized-EGD on 1/28-no stigmata of bleeding-banded ulcer 2/1>> recurrent upper GI bleeding with hemorrhagic shock-admit to ICU 2/2>> EGD: Multiple post banding ulcers-actively oozing blood-s/p 6 bands, blood in duodenum/stomach. 2/4>> transferred to [facility]  Significant studies: 2/3>> Echo: EF [id number], RV systolic function is low normal 2/3>> liver Doppler: No splenic varices, no  portal vein occlusion/thrombus  COVID-19 medications: None  Antibiotics: Rocephin: 2/1>>2/7  Microbiology data: None  Procedures: 1/20>> EGD 1/28>> EGD 2/1>> EGD  Consults: GI, PCCM, IR   Brief Hospital Course: Upper GI bleeding from post banding esophageal ulcerations-acute blood loss anemia and hemorrhagic shock:  Hemorrhagic shock resolved-hemoglobin relatively stable after multiple units of PRBC transfusion-stools are now brown in color per patient.  Hemoglobin relatively stable over the past few days.  Completed octreotide/PPI infusion-now on twice daily dosing of PPI and Carafate.  Has completed a course of prophylactic Rocephin.  Spoke with Dr. Havery Moros today-patient does not want to proceed with TIPS-wants to instead proceed with repeat EGD and banding if necessary.  Per [facility] to discharge-they will follow patient in 2 weeks and make outpatient arrangements for follow-up/repeat EGD.  Continue PPI twice daily on discharge.  AKI on CKD stage IIIa: AKI hemodynamically mediated-due to shock-creatinine significantly better and now back to baseline.  Per patient/family patient has a nonfunctioning left kidney.    Acute metabolic encephalopathy: Due to hypotension/AKI-improved-encephalopathy has resolved.    History of [name] with liver cirrhosis: Relatively well compensated with the exception for variceal bleeding  Myelodysplastic syndrome: Follow CBC closely and transfuse as needed  Thrombocytopenia: Likely secondary to hypersplenism-has been relatively stable over the past few days.  Follow.  COPD/OSA: Stable-continue bronchodilators  BPH: Continue Flomax  Depression: Stable-continue trazodone, Celexa and Wellbutrin  HLD:  Statins held due to elevated LFTs-as LFTs have improved-okay to resume statin on discharge.  HTN: Continue to hold losartan-BP stable-PCP to resume when able  DM-2: CBGs stable-continue usual dosing of insulin on  discharge.  COVID-19 infection: Asymptomatic-fully vaccinated and boosted-continue to watch closely.  Needs 10 days of isolation from 2/28.  Debility/deconditioning: Due to acute illness-evaluated by PT-initially recommendations were for SNF-social worker/case management discussed with family today-family reluctant to place patient in SNF as may get exposed with COVID again (spouse does not believe patient actually had COVID-19 infection).  Family has elected to take patient home with maximal home health services.   COVID-19 Labs:  No results for input(s): DDIMER, FERRITIN, LDH, CRP in the last 72 hours.  Lab Results  Component Value Date   SARSCOV2NAA NEGATIVE 04/15/2020    Obesity: Estimated body mass index is 30.97 kg/m as calculated from the following:   Height as of this encounter: 5\' 7"  (1.702 m).   Weight as of this encounter: 89.7 kg.   Discharge Diagnoses:  Active Problems:   Cirrhosis of liver without ascites (HCC)   Upper GI bleed   Bleeding esophageal varices (HCC)   Discharge Instructions:    Person Under Monitoring Name: Corey Wood  Location: [address] [place] [address]   Infection Prevention Recommendations for Individuals Confirmed to have, or Being Evaluated for, 2019 Novel Coronavirus (COVID-19) Infection Who Receive Care at Home  Individuals who are confirmed to have, or are being evaluated for, COVID-19 should follow the prevention steps below until a healthcare provider or local or state health department says they can return to normal activities.  Stay home except to get medical care You should restrict activities outside your home, except for getting medical care. Do not go to work, school, or public areas, and do not use public transportation or taxis.  Call ahead before visiting your doctor Before your medical appointment, call the healthcare provider and tell them that you have, or are being evaluated for, COVID-19 infection.  This will help the healthcare provider's office take steps to keep other people from getting infected. Ask your healthcare provider to call the local or state health department.  Monitor your symptoms Seek prompt medical attention if your illness is worsening (e.g., difficulty breathing). Before going to your medical appointment, call the healthcare provider and tell them that you have, or are being evaluated for, COVID-19 infection. Ask your healthcare provider to call the local or state health department.  Wear a facemask You should wear a facemask that covers your nose and mouth when you are in the same room with other people and when you visit a healthcare provider. People who live with or visit you should also wear a facemask while they are in the same room with you.  Separate yourself from other people in your home As much as possible, you should stay in a different room from other people in your home. Also, you should use a separate bathroom, if available.  Avoid sharing household items You should not share dishes, drinking glasses, cups, eating utensils, towels, bedding, or other items with other people in your home. After using these items, you should wash them thoroughly with soap and water.  Cover your coughs and sneezes Cover your mouth and nose with a tissue when you cough or sneeze, or you can cough or sneeze into your sleeve. Throw used tissues in a lined trash can, and immediately wash your hands with soap and water for at least 20 seconds or use an alcohol-based hand rub.  Wash your [employer] your hands often and thoroughly with soap and water for at least 20 seconds. You can use an alcohol-based hand sanitizer  if soap and water are not available and if your hands are not visibly dirty. Avoid touching your eyes, nose, and mouth with unwashed hands.   Prevention Steps for Caregivers and Household Members of Individuals Confirmed to have, or Being Evaluated for,  COVID-19 Infection Being Cared for in the Home  If you live with, or provide care at home for, a person confirmed to have, or being evaluated for, COVID-19 infection please follow these guidelines to prevent infection:  Follow healthcare provider's instructions Make sure that you understand and can help the patient follow any healthcare provider instructions for all care.  Provide for the patient's basic needs You should help the patient with basic needs in the home and provide support for getting groceries, prescriptions, and other personal needs.  Monitor the patient's symptoms If they are getting sicker, call his or her medical provider and tell them that the patient has, or is being evaluated for, COVID-19 infection. This will help the healthcare provider's office take steps to keep other people from getting infected. Ask the healthcare provider to call the local or state health department.  Limit the number of people who have contact with the patient  If possible, have only one caregiver for the patient.  Other household members should stay in another home or place of residence. If this is not possible, they should stay  in another room, or be separated from the patient as much as possible. Use a separate bathroom, if available.  Restrict visitors who do not have an essential need to be in the home.  Keep older adults, very young children, and other sick people away from the patient Keep older adults, very young children, and those who have compromised immune systems or chronic health conditions away from the patient. This includes people with chronic heart, lung, or kidney conditions, diabetes, and cancer.  Ensure good ventilation Make sure that shared spaces in the home have good air flow, such as from an air conditioner or an opened window, weather permitting.  Wash your hands often  Wash your hands often and thoroughly with soap and water for at least 20 seconds. You can  use an alcohol based hand sanitizer if soap and water are not available and if your hands are not visibly dirty.  Avoid touching your eyes, nose, and mouth with unwashed hands.  Use disposable paper towels to dry your hands. If not available, use dedicated cloth towels and replace them when they become wet.  Wear a facemask and gloves  Wear a disposable facemask at all times in the room and gloves when you touch or have contact with the patient's blood, body fluids, and/or secretions or excretions, such as sweat, saliva, sputum, nasal mucus, vomit, urine, or feces.  Ensure the mask fits over your nose and mouth tightly, and do not touch it during use.  Throw out disposable facemasks and gloves after using them. Do not reuse.  Wash your hands immediately after removing your facemask and gloves.  If your personal clothing becomes contaminated, carefully remove clothing and launder. Wash your hands after handling contaminated clothing.  Place all used disposable facemasks, gloves, and other waste in a lined container before disposing them with other household waste.  Remove gloves and wash your hands immediately after handling these items.  Do not share dishes, glasses, or other household items with the patient  Avoid sharing household items. You should not share dishes, drinking glasses, cups, eating utensils, towels, bedding, or other items with a  patient who is confirmed to have, or being evaluated for, COVID-19 infection.  After the person uses these items, you should wash them thoroughly with soap and water.  Wash laundry thoroughly  Immediately remove and wash clothes or bedding that have blood, body fluids, and/or secretions or excretions, such as sweat, saliva, sputum, nasal mucus, vomit, urine, or feces, on them.  Wear gloves when handling laundry from the patient.  Read and follow directions on labels of laundry or clothing items and detergent. In general, wash and dry with the  warmest temperatures recommended on the label.  Clean all areas the individual has used often  Clean all touchable surfaces, such as counters, tabletops, doorknobs, bathroom fixtures, toilets, phones, keyboards, tablets, and bedside tables, every day. Also, clean any surfaces that may have blood, body fluids, and/or secretions or excretions on them.  Wear gloves when cleaning surfaces the patient has come in contact with.  Use a diluted bleach solution (e.g., dilute bleach with 1 part bleach and 10 parts water) or a household disinfectant with a label that says EPA-registered for coronaviruses. To make a bleach solution at home, add 1 tablespoon of bleach to 1 quart (4 cups) of water. For a larger supply, add  cup of bleach to 1 gallon (16 cups) of water.  Read labels of cleaning products and follow recommendations provided on product labels. Labels contain instructions for safe and effective use of the cleaning product including precautions you should take when applying the product, such as wearing gloves or eye protection and making sure you have good ventilation during use of the product.  Remove gloves and wash hands immediately after cleaning.  Monitor yourself for signs and symptoms of illness Caregivers and household members are considered close contacts, should monitor their health, and will be asked to limit movement outside of the home to the extent possible. Follow the monitoring steps for close contacts listed on the symptom monitoring form.   ? If you have additional questions, contact your local health department or call the epidemiologist on call at [phone] (available 24/7). ? This guidance is subject to change. For the most up-to-date guidance from [facility], please refer to their website: YouBlogs.pl    Activity:  As tolerated with Full fall precautions use walker/cane & assistance as needed  Discharge  Instructions    Call MD for:   Complete by: As directed    Seek immediate medical attention if you have blood in your stools, vomiting blood or black stools.   Call MD for:  difficulty breathing, headache or visual disturbances   Complete by: As directed    Diet - low sodium heart healthy   Complete by: As directed    Discharge instructions   Complete by: As directed    1.)  10 days of isolation from 2/28  2.)  If you develop worsening shortness of breath-please seek immediate medical attention.  3.)  If you develop recurrent bloody vomitus/bloody stool/black stools-please seek immediate medical attention  4.)  Your blood pressure medications on hold-please ask your primary care practitioner at next visit whether this can be resumed.  5.)  Please ensure that you follow-up with the gastroenterology clinic-Dr. Armbruster's office.  6.)  Please ask your primary care practitioner to repeat CBC and chemistry panel in 1 week.   Follow with Primary MD  [name], MD in 1 weeks  Follow with GI MD-Dr. Havery Moros in 2 weeks-office will call with a follow-up appointment.  If you do not hear  from them-please give them a call.  Please get a complete blood count and chemistry panel checked by your Primary MD at your next visit, and again as instructed by your Primary MD.  Get Medicines reviewed and adjusted: Please take all your medications with you for your next visit with your Primary MD  Laboratory/radiological data: Please request your Primary MD to go over all hospital tests and procedure/radiological results at the follow up, please ask your Primary MD to get all Hospital records sent to his/her office.  In some cases, they will be blood work, cultures and biopsy results pending at the time of your discharge. Please request that your primary care M.D. follows up on these results.  Also Note the following: If you experience worsening of your admission symptoms, develop shortness of  breath, life threatening emergency, suicidal or homicidal thoughts you must seek medical attention immediately by calling 911 or calling your MD immediately  if symptoms less severe.  You must read complete instructions/literature along with all the possible adverse reactions/side effects for all the Medicines you take and that have been prescribed to you. Take any new Medicines after you have completely understood and accpet all the possible adverse reactions/side effects.   Do not drive when taking Pain medications or sleeping medications (Benzodaizepines)  Do not take more than prescribed Pain, Sleep and Anxiety Medications. It is not advisable to combine anxiety,sleep and pain medications without talking with your primary care practitioner  Special Instructions: If you have smoked or chewed Tobacco  in the last 2 yrs please stop smoking, stop any regular Alcohol  and or any Recreational drug use.  Wear Seat belts while driving.  Please note: You were cared for by a hospitalist during your hospital stay. Once you are discharged, your primary care physician will handle any further medical issues. Please note that NO REFILLS for any discharge medications will be authorized once you are discharged, as it is imperative that you return to your primary care physician (or establish a relationship with a primary care physician if you do not have one) for your post hospital discharge needs so that they can reassess your need for medications and monitor your lab values.   Discharge wound care:   Complete by: As directed    Wash with soap and water, dry well. Apply Xeroform and cover with dry dressing.   Increase activity slowly   Complete by: As directed      Allergies as of 05/05/2020      Reactions   Lisinopril Cough      Medication List    STOP taking these medications   aspirin 81 MG chewable tablet   ciprofloxacin 500 MG tablet Commonly known as: CIPRO   losartan 100 MG  tablet Commonly known as: COZAAR   potassium citrate 10 MEQ (1080 MG) SR tablet Commonly known as: UROCIT-K     TAKE these medications   albuterol 108 (90 Base) MCG/ACT inhaler Commonly known as: VENTOLIN HFA Inhale 2 puffs into the lungs daily as needed for wheezing or shortness of breath.   ARTIFICIAL SALIVA MT Use as directed 4 sprays in the mouth or throat at bedtime.   atorvastatin 20 MG tablet Commonly known as: LIPITOR Take 20 mg by mouth at bedtime.   buPROPion 150 MG 24 hr tablet Commonly known as: WELLBUTRIN XL Take 150 mg by mouth daily.   cetirizine 10 MG chewable tablet Commonly known as: ZYRTEC Chew 10 mg by mouth at bedtime.  citalopram 40 MG tablet Commonly known as: CELEXA Take 40 mg by mouth daily.   diclofenac Sodium 1 % Gel Commonly known as: VOLTAREN Apply 2 g topically 2 (two) times daily as needed (pain).   docusate sodium 100 MG capsule Commonly known as: COLACE Take 200 mg by mouth daily as needed for mild constipation.   ferrous sulfate 325 (65 FE) MG tablet Take 1 tablet (325 mg total) by mouth 2 (two) times daily with a meal. What changed: when to take this   fluticasone 50 MCG/ACT nasal spray Commonly known as: FLONASE Place 2 sprays into both nostrils daily.   glucose 4 GM chewable tablet Chew 1 tablet by mouth daily as needed for low blood sugar (If drop below 70).   insulin glargine 100 unit/mL Sopn Commonly known as: LANTUS Inject 10 Units into the skin daily.   ipratropium 0.03 % nasal spray Commonly known as: ATROVENT Place 2 sprays into both nostrils at bedtime.   ketotifen 0.025 % ophthalmic solution Commonly known as: ZADITOR Place 1 drop into both eyes at bedtime.   meclizine 25 MG tablet Commonly known as: ANTIVERT Take 25 mg by mouth 2 (two) times daily as needed for dizziness.   montelukast 10 MG tablet Commonly known as: SINGULAIR Take 10 mg by mouth daily.   pantoprazole 40 MG tablet Commonly known as:  PROTONIX Take 1 tablet (40 mg total) by mouth 2 (two) times daily before a meal. What changed: when to take this   tamsulosin 0.4 MG Caps capsule Commonly known as: FLOMAX Take 0.4 mg by mouth 2 (two) times daily.   traZODone 150 MG tablet Commonly known as: DESYREL Take 75 mg by mouth at bedtime.   vitamin B-12 500 MCG tablet Commonly known as: CYANOCOBALAMIN Take 500 mcg by mouth every other day.            Durable Medical Equipment  (From admission, onward)         Start     Ordered   05/05/20 1334  For home use only DME Walker rolling  Once       Question Answer Comment  Walker: With [employer] Wheels   Patient needs a walker to treat with the following condition Physical deconditioning      05/05/20 1334           Discharge Care Instructions  (From admission, onward)         Start     Ordered   05/05/20 0000  Discharge wound care:       Comments: Wash with soap and water, dry well. Apply Xeroform and cover with dry dressing.   05/05/20 1344          Allergies  Allergen Reactions  . Lisinopril Cough    Other Procedures/Studies: DG CHEST PORT 1 VIEW  Result Date: 04/29/2020 CLINICAL DATA:  Central line placement EXAM: PORTABLE CHEST 1 VIEW COMPARISON:  04/29/2020 chest radiograph. FINDINGS: Right internal jugular central venous catheter terminates in lower third of the SVC. Left internal jugular central venous catheter terminates in high left mediastinum at the superior margin of the aortic arch in an indeterminate position. Pacer pads overlie chest bilaterally. Stable cardiomediastinal silhouette with top-normal heart size. No pneumothorax. Stable small left pleural effusion. No right pleural effusion. No pulmonary edema. Stable mild left basilar hazy opacity, favor atelectasis. IMPRESSION: 1. Right internal jugular central venous catheter terminates in the lower third of the SVC. 2. Left internal jugular central venous catheter terminates  in the high left  mediastinum at the superior margin of the aortic arch in an indeterminate position, suspicious for arterial placement as previously mentioned. 3. Stable small left pleural effusion and mild left basilar hazy opacity, favor atelectasis. Critical Value/emergent results were called by telephone at the time of interpretation on 04/29/2020 at 11:12 am to provider DR. CHANDS, who verbally acknowledged these results. The left neck catheter has been removed by the time of this interpretation. Electronically Signed   By: Ilona Sorrel M.D.   On: 04/29/2020 11:13   DG Chest Port 1 View  Result Date: 04/29/2020 CLINICAL DATA:  Redressed mint of central line. EXAM: PORTABLE CHEST 1 VIEW COMPARISON:  04/27/2020 FINDINGS: Left internal jugular central venous catheter with the tip kinked and projecting at the superior aspect of the aortic arch. Similar cardiomediastinal silhouette. Similar small left pleural effusion with overlying opacities. No visible pneumothorax on this limited supine radiograph. IMPRESSION: 1. Left internal jugular central venous catheter with the tip kinked and projecting at the superior aspect of the aortic arch. While this could represent a venous placement, arterial placement cannot be excluded on this image. Recommend correlation with blood gas analysis. 2. Similar small left pleural effusion with overlying opacities, which could represent atelectasis, aspiration, and/or pneumonia. Findings and recommendations discussed with Dr. Christy Gentles via telephone at 8:34 a.m. Electronically Signed   By: Margaretha Sheffield MD   On: 04/29/2020 08:38   DG Chest Portable 1 View  Result Date: 04/29/2020 CLINICAL DATA:  Line placement EXAM: PORTABLE CHEST 1 VIEW COMPARISON:  April 26, 2020 FINDINGS: The cardiomediastinal silhouette is unchanged and mildly enlarged in contour.LEFT IJ CVC tip terminates over the expected region of the LEFT brachiocephalic vein confluence with the subclavian. Small LEFT pleural effusion,  unchanged. No pneumothorax. LEFT retrocardiac opacity, possibly atelectasis. Visualized abdomen is unremarkable. No acute osseous abnormality. IMPRESSION: LEFT IJ CVC tip terminates over the expected region of the LEFT brachiocephalic vein confluence with the subclavian. No pneumothorax. Electronically Signed   By: Valentino Saxon MD   On: 04/29/2020 07:54   DG CHEST PORT 1 VIEW  Result Date: 04/26/2020 CLINICAL DATA:  Hemoptysis EXAM: PORTABLE CHEST 1 VIEW COMPARISON:  11/06/2019 FINDINGS: Small left pleural effusion. Generous heart size accentuated by technique. No air bronchogram or pulmonary edema. No pneumothorax. Extensive artifact from EKG leads. IMPRESSION: Small left pleural effusion. Electronically Signed   By: Monte Fantasia M.D.   On: 04/26/2020 07:50   DG Foot 2 Views Left  Result Date: 04/29/2020 CLINICAL DATA:  Fall. EXAM: LEFT FOOT - 2 VIEW COMPARISON:  None. FINDINGS: No acute fracture or dislocation is identified. There are large posterior and plantar calcaneal enthesophytes. There is the suggestion of soft tissue swelling at the medial aspect of the ankle. Atherosclerotic vascular calcifications are noted. IMPRESSION: No acute osseous abnormality identified. Electronically Signed   By: Logan Bores M.D.   On: 04/29/2020 13:20   US LIVER DOPPLER  Result Date: 05/02/2020 CLINICAL DATA:  Cirrhosis EXAM: DUPLEX ULTRASOUND OF LIVER TECHNIQUE: Color and duplex Doppler ultrasound was performed to evaluate the hepatic in-flow and out-flow vessels. COMPARISON:  CT abdomen pelvis 11/07/2019 FINDINGS: Liver: Normal parenchymal echogenicity. Normal hepatic contour without nodularity. No focal lesion, mass or intrahepatic biliary ductal dilatation. Main Portal Vein size: 1.3 cm Portal Vein Velocities Main Prox:  34 cm/sec Main Mid: 22 cm/sec Main Dist:  21 cm/sec Right: 22 cm/sec Left: 17 cm/sec Hepatic Vein Velocities Right:  20 cm/sec Middle:  17 cm/sec Left:  18 cm/sec IVC: Present and patent  with normal respiratory phasicity. Hepatic Artery Velocity:  98 cm/sec Splenic Vein Velocity:  13 cm/sec Spleen: 18.6 x 10.1 x 7.1 cm with a total volume of 695 cm^3 (411 cm^3 is upper limit normal) Portal Vein Occlusion/Thrombus: No Splenic Vein Occlusion/Thrombus: No Ascites: None Varices: None Hydronephrotic a atrophic left kidney partially visualized. IMPRESSION: No definite splenic varices identified. The spleen is enlarged. Splenic vein is patent. If there is continued concern for splenic varices, further evaluation with contrast enhanced abdominal CT should be performed. Electronically Signed   By: Miachel Roux M.D.   On: 05/02/2020 08:11   ECHOCARDIOGRAM LIMITED  Result Date: 05/01/2020    ECHOCARDIOGRAM REPORT   Patient Name:   Corey Wood Date of Exam: 05/01/2020 Medical Rec #:  [id number]      Height:       67.0 in Accession #:    [id number]     Weight:       190.7 lb Date of Birth:  [birth year]      BSA:          1.982 m Patient Age:    86 years       BP:           141/66 mmHg Patient Gender: M              HR:           63 bpm. Exam Location:  Inpatient Procedure: Limited Echo, Limited Color Doppler and Cardiac Doppler Indications:    esophageal varices  History:        Patient has no prior history of Echocardiogram examinations.                 Covid; Risk Factors:Diabetes.  Sonographer:    Johny Chess Referring Phys: [id number] [place]  1. Left ventricular ejection fraction, by estimation, is 60 to 65%. The left ventricle has normal function. The left ventricle has no regional wall motion abnormalities. Left ventricular diastolic parameters were normal.  2. Right ventricular systolic function is low normal. The right ventricular size is mildly enlarged. There is normal pulmonary artery systolic pressure.  3. The mitral valve is normal in structure. Trivial mitral valve regurgitation.  4. The aortic valve is abnormal. Aortic valve regurgitation is mild. Mild to moderate aortic  valve sclerosis/calcification is present, without any evidence of aortic stenosis.  5. Aortic dilatation noted. There is mild dilatation of the ascending aorta, measuring 39 mm.  6. The inferior vena cava is normal in size with greater than 50% respiratory variability, suggesting right atrial pressure of 3 mmHg. FINDINGS  Left Ventricle: Left ventricular ejection fraction, by estimation, is 60 to 65%. The left ventricle has normal function. The left ventricle has no regional wall motion abnormalities. The left ventricular internal cavity size was normal in size. There is  no left ventricular hypertrophy. Left ventricular diastolic parameters were normal. Right Ventricle: The right ventricular size is mildly enlarged. Right vetricular wall thickness was not assessed. Right ventricular systolic function is low normal. There is normal pulmonary artery systolic pressure. The tricuspid regurgitant velocity is  2.48 m/s, and with an assumed right atrial pressure of 3 mmHg, the estimated right ventricular systolic pressure is 0000000 mmHg. Left Atrium: Left atrial size was normal in size. Right Atrium: Right atrial size was normal in size. Pericardium: There is no evidence of pericardial effusion. Mitral Valve: The mitral valve is normal in structure. There is mild  thickening of the mitral valve leaflet(s). Trivial mitral valve regurgitation. Tricuspid Valve: The tricuspid valve is normal in structure. Tricuspid valve regurgitation is mild. Aortic Valve: The aortic valve is abnormal. Aortic valve regurgitation is mild. Aortic regurgitation PHT measures 679 msec. Mild to moderate aortic valve sclerosis/calcification is present, without any evidence of aortic stenosis. Aortic valve mean gradient measures 12.0 mmHg. Aortic valve peak gradient measures 20.1 mmHg. Aortic valve area, by VTI measures 2.11 cm. Pulmonic Valve: The pulmonic valve was normal in structure. Pulmonic valve regurgitation is not visualized. Aorta: Aortic  dilatation noted. There is mild dilatation of the ascending aorta, measuring 39 mm. Venous: The inferior vena cava is normal in size with greater than 50% respiratory variability, suggesting right atrial pressure of 3 mmHg. IAS/Shunts: The interatrial septum was not assessed.  LEFT VENTRICLE PLAX 2D LVIDd:         5.10 cm  Diastology LVIDs:         3.20 cm  LV e' medial:   8.38 cm/s LV PW:         1.00 cm  LV E/e' medial: 8.3 LV IVS:        0.90 cm LVOT diam:     2.00 cm LV SV:         99 LV SV Index:   50 LVOT Area:     3.14 cm  IVC IVC diam: 1.70 cm LEFT ATRIUM         Index LA diam:    3.80 cm 1.92 cm/m  AORTIC VALVE AV Area (Vmax):    1.92 cm AV Area (Vmean):   1.76 cm AV Area (VTI):     2.11 cm AV Vmax:           224.00 cm/s AV Vmean:          163.000 cm/s AV VTI:            0.470 m AV Peak Grad:      20.1 mmHg AV Mean Grad:      12.0 mmHg LVOT Vmax:         137.00 cm/s LVOT Vmean:        91.300 cm/s LVOT VTI:          0.316 m LVOT/AV VTI ratio: 0.67 AI PHT:            679 msec  AORTA Ao Root diam: 3.20 cm Ao Asc diam:  3.90 cm MITRAL VALVE               TRICUSPID VALVE MV Area (PHT): 2.76 cm    TR Peak grad:   24.6 mmHg MV Decel Time: 275 msec    TR Vmax:        248.00 cm/s MV E velocity: 69.80 cm/s MV A velocity: 82.70 cm/s  SHUNTS MV E/A ratio:  0.84        Systemic VTI:  0.32 m                            Systemic Diam: 2.00 cm Dorris Carnes MD Electronically signed by Dorris Carnes MD Signature Date/Time: 05/01/2020/7:58:50 PM    Final       TODAY-DAY OF DISCHARGE:  Subjective:   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   Objective:   Blood pressure 104/60, pulse 60, temperature 97.9 F (36.6 C), temperature source  Oral, resp. rate 20, height 5\' 7"  (1.702 m), weight 89.7 kg, SpO2 93 %. No intake or output data in the 24 hours ending 05/05/20 1347 Filed Weights   05/03/20 0500 05/04/20 0500  05/05/20 0433  Weight: 88.8 kg 88.7 kg 89.7 kg    Exam: Awake Alert, Oriented *3, No new F.N deficits, Normal affect [place].AT,PERRAL Supple Neck,No JVD, No cervical lymphadenopathy appriciated.  Symmetrical Chest wall movement, Good air movement bilaterally, CTAB RRR,No Gallops,Rubs or new Murmurs, No Parasternal Heave +ve B.Sounds, Abd Soft, Non tender, No organomegaly appriciated, No rebound -guarding or rigidity. No Cyanosis, Clubbing or edema, No new Rash or bruise   PERTINENT RADIOLOGIC STUDIES: DG CHEST PORT 1 VIEW  Result Date: 04/29/2020 CLINICAL DATA:  Central line placement EXAM: PORTABLE CHEST 1 VIEW COMPARISON:  04/29/2020 chest radiograph. FINDINGS: Right internal jugular central venous catheter terminates in lower third of the SVC. Left internal jugular central venous catheter terminates in high left mediastinum at the superior margin of the aortic arch in an indeterminate position. Pacer pads overlie chest bilaterally. Stable cardiomediastinal silhouette with top-normal heart size. No pneumothorax. Stable small left pleural effusion. No right pleural effusion. No pulmonary edema. Stable mild left basilar hazy opacity, favor atelectasis. IMPRESSION: 1. Right internal jugular central venous catheter terminates in the lower third of the SVC. 2. Left internal jugular central venous catheter terminates in the high left mediastinum at the superior margin of the aortic arch in an indeterminate position, suspicious for arterial placement as previously mentioned. 3. Stable small left pleural effusion and mild left basilar hazy opacity, favor atelectasis. Critical Value/emergent results were called by telephone at the time of interpretation on 04/29/2020 at 11:12 am to provider DR. CHANDS, who verbally acknowledged these results. The left neck catheter has been removed by the time of this interpretation. Electronically Signed   By: Ilona Sorrel M.D.   On: 04/29/2020 11:13   DG Chest Port 1  View  Result Date: 04/29/2020 CLINICAL DATA:  Redressed mint of central line. EXAM: PORTABLE CHEST 1 VIEW COMPARISON:  04/27/2020 FINDINGS: Left internal jugular central venous catheter with the tip kinked and projecting at the superior aspect of the aortic arch. Similar cardiomediastinal silhouette. Similar small left pleural effusion with overlying opacities. No visible pneumothorax on this limited supine radiograph. IMPRESSION: 1. Left internal jugular central venous catheter with the tip kinked and projecting at the superior aspect of the aortic arch. While this could represent a venous placement, arterial placement cannot be excluded on this image. Recommend correlation with blood gas analysis. 2. Similar small left pleural effusion with overlying opacities, which could represent atelectasis, aspiration, and/or pneumonia. Findings and recommendations discussed with Dr. Christy Gentles via telephone at 8:34 a.m. Electronically Signed   By: Margaretha Sheffield MD   On: 04/29/2020 08:38   DG Chest Portable 1 View  Result Date: 04/29/2020 CLINICAL DATA:  Line placement EXAM: PORTABLE CHEST 1 VIEW COMPARISON:  April 26, 2020 FINDINGS: The cardiomediastinal silhouette is unchanged and mildly enlarged in contour.LEFT IJ CVC tip terminates over the expected region of the LEFT brachiocephalic vein confluence with the subclavian. Small LEFT pleural effusion, unchanged. No pneumothorax. LEFT retrocardiac opacity, possibly atelectasis. Visualized abdomen is unremarkable. No acute osseous abnormality. IMPRESSION: LEFT IJ CVC tip terminates over the expected region of the LEFT brachiocephalic vein confluence with the subclavian. No pneumothorax. Electronically Signed   By: Valentino Saxon MD   On: 04/29/2020 07:54   DG CHEST PORT 1 VIEW  Result Date: 04/26/2020 CLINICAL  DATA:  Hemoptysis EXAM: PORTABLE CHEST 1 VIEW COMPARISON:  11/06/2019 FINDINGS: Small left pleural effusion. Generous heart size accentuated by  technique. No air bronchogram or pulmonary edema. No pneumothorax. Extensive artifact from EKG leads. IMPRESSION: Small left pleural effusion. Electronically Signed   By: Monte Fantasia M.D.   On: 04/26/2020 07:50   DG Foot 2 Views Left  Result Date: 04/29/2020 CLINICAL DATA:  Fall. EXAM: LEFT FOOT - 2 VIEW COMPARISON:  None. FINDINGS: No acute fracture or dislocation is identified. There are large posterior and plantar calcaneal enthesophytes. There is the suggestion of soft tissue swelling at the medial aspect of the ankle. Atherosclerotic vascular calcifications are noted. IMPRESSION: No acute osseous abnormality identified. Electronically Signed   By: Logan Bores M.D.   On: 04/29/2020 13:20   US LIVER DOPPLER  Result Date: 05/02/2020 CLINICAL DATA:  Cirrhosis EXAM: DUPLEX ULTRASOUND OF LIVER TECHNIQUE: Color and duplex Doppler ultrasound was performed to evaluate the hepatic in-flow and out-flow vessels. COMPARISON:  CT abdomen pelvis 11/07/2019 FINDINGS: Liver: Normal parenchymal echogenicity. Normal hepatic contour without nodularity. No focal lesion, mass or intrahepatic biliary ductal dilatation. Main Portal Vein size: 1.3 cm Portal Vein Velocities Main Prox:  34 cm/sec Main Mid: 22 cm/sec Main Dist:  21 cm/sec Right: 22 cm/sec Left: 17 cm/sec Hepatic Vein Velocities Right:  20 cm/sec Middle:  17 cm/sec Left:  18 cm/sec IVC: Present and patent with normal respiratory phasicity. Hepatic Artery Velocity:  98 cm/sec Splenic Vein Velocity:  13 cm/sec Spleen: 18.6 x 10.1 x 7.1 cm with a total volume of 695 cm^3 (411 cm^3 is upper limit normal) Portal Vein Occlusion/Thrombus: No Splenic Vein Occlusion/Thrombus: No Ascites: None Varices: None Hydronephrotic a atrophic left kidney partially visualized. IMPRESSION: No definite splenic varices identified. The spleen is enlarged. Splenic vein is patent. If there is continued concern for splenic varices, further evaluation with contrast enhanced abdominal CT  should be performed. Electronically Signed   By: Miachel Roux M.D.   On: 05/02/2020 08:11   ECHOCARDIOGRAM LIMITED  Result Date: 05/01/2020    ECHOCARDIOGRAM REPORT   Patient Name:   SKYDEN MONGAR Date of Exam: 05/01/2020 Medical Rec #:  [id number]      Height:       67.0 in Accession #:    [id number]     Weight:       190.7 lb Date of Birth:  1951/01/08      BSA:          1.982 m Patient Age:    39 years       BP:           141/66 mmHg Patient Gender: M              HR:           63 bpm. Exam Location:  Inpatient Procedure: Limited Echo, Limited Color Doppler and Cardiac Doppler Indications:    esophageal varices  History:        Patient has no prior history of Echocardiogram examinations.                 Covid; Risk Factors:Diabetes.  Sonographer:    Johny Chess Referring Phys: [id number] [place]  1. Left ventricular ejection fraction, by estimation, is 60 to 65%. The left ventricle has normal function. The left ventricle has no regional wall motion abnormalities. Left ventricular diastolic parameters were normal.  2. Right ventricular systolic function is low normal. The right ventricular size  is mildly enlarged. There is normal pulmonary artery systolic pressure.  3. The mitral valve is normal in structure. Trivial mitral valve regurgitation.  4. The aortic valve is abnormal. Aortic valve regurgitation is mild. Mild to moderate aortic valve sclerosis/calcification is present, without any evidence of aortic stenosis.  5. Aortic dilatation noted. There is mild dilatation of the ascending aorta, measuring 39 mm.  6. The inferior vena cava is normal in size with greater than 50% respiratory variability, suggesting right atrial pressure of 3 mmHg. FINDINGS  Left Ventricle: Left ventricular ejection fraction, by estimation, is 60 to 65%. The left ventricle has normal function. The left ventricle has no regional wall motion abnormalities. The left ventricular internal cavity size was normal in  size. There is  no left ventricular hypertrophy. Left ventricular diastolic parameters were normal. Right Ventricle: The right ventricular size is mildly enlarged. Right vetricular wall thickness was not assessed. Right ventricular systolic function is low normal. There is normal pulmonary artery systolic pressure. The tricuspid regurgitant velocity is  2.48 m/s, and with an assumed right atrial pressure of 3 mmHg, the estimated right ventricular systolic pressure is 0000000 mmHg. Left Atrium: Left atrial size was normal in size. Right Atrium: Right atrial size was normal in size. Pericardium: There is no evidence of pericardial effusion. Mitral Valve: The mitral valve is normal in structure. There is mild thickening of the mitral valve leaflet(s). Trivial mitral valve regurgitation. Tricuspid Valve: The tricuspid valve is normal in structure. Tricuspid valve regurgitation is mild. Aortic Valve: The aortic valve is abnormal. Aortic valve regurgitation is mild. Aortic regurgitation PHT measures 679 msec. Mild to moderate aortic valve sclerosis/calcification is present, without any evidence of aortic stenosis. Aortic valve mean gradient measures 12.0 mmHg. Aortic valve peak gradient measures 20.1 mmHg. Aortic valve area, by VTI measures 2.11 cm. Pulmonic Valve: The pulmonic valve was normal in structure. Pulmonic valve regurgitation is not visualized. Aorta: Aortic dilatation noted. There is mild dilatation of the ascending aorta, measuring 39 mm. Venous: The inferior vena cava is normal in size with greater than 50% respiratory variability, suggesting right atrial pressure of 3 mmHg. IAS/Shunts: The interatrial septum was not assessed.  LEFT VENTRICLE PLAX 2D LVIDd:         5.10 cm  Diastology LVIDs:         3.20 cm  LV e' medial:   8.38 cm/s LV PW:         1.00 cm  LV E/e' medial: 8.3 LV IVS:        0.90 cm LVOT diam:     2.00 cm LV SV:         99 LV SV Index:   50 LVOT Area:     3.14 cm  IVC IVC diam: 1.70 cm LEFT  ATRIUM         Index LA diam:    3.80 cm 1.92 cm/m  AORTIC VALVE AV Area (Vmax):    1.92 cm AV Area (Vmean):   1.76 cm AV Area (VTI):     2.11 cm AV Vmax:           224.00 cm/s AV Vmean:          163.000 cm/s AV VTI:            0.470 m AV Peak Grad:      20.1 mmHg AV Mean Grad:      12.0 mmHg LVOT Vmax:         137.00 cm/s LVOT  Vmean:        91.300 cm/s LVOT VTI:          0.316 m LVOT/AV VTI ratio: 0.67 AI PHT:            679 msec  AORTA Ao Root diam: 3.20 cm Ao Asc diam:  3.90 cm MITRAL VALVE               TRICUSPID VALVE MV Area (PHT): 2.76 cm    TR Peak grad:   24.6 mmHg MV Decel Time: 275 msec    TR Vmax:        248.00 cm/s MV E velocity: 69.80 cm/s MV A velocity: 82.70 cm/s  SHUNTS MV E/A ratio:  0.84        Systemic VTI:  0.32 m                            Systemic Diam: 2.00 cm Dorris Carnes MD Electronically signed by Dorris Carnes MD Signature Date/Time: 05/01/2020/7:58:50 PM    Final      PERTINENT LAB RESULTS: CBC: Recent Labs    05/04/20 0206 05/05/20 0151  WBC 3.8* 3.7*  HGB 7.6* 7.3*  HCT 23.0* 21.0*  PLT 49* 55*   CMET CMP     Component Value Date/Time   NA 135 05/05/2020 0151   K 3.6 05/05/2020 0151   CL 103 05/05/2020 0151   CO2 26 05/05/2020 0151   GLUCOSE 165 (H) 05/05/2020 0151   BUN 25 (H) 05/05/2020 0151   CREATININE 1.56 (H) 05/05/2020 0151   CALCIUM 8.3 (L) 05/05/2020 0151   PROT 5.3 (L) 05/05/2020 0151   ALBUMIN 2.2 (L) 05/05/2020 0151   AST 27 05/05/2020 0151   ALT 66 (H) 05/05/2020 0151   ALKPHOS 63 05/05/2020 0151   BILITOT 0.5 05/05/2020 0151   GFRNONAA 47 (L) 05/05/2020 0151    GFR Estimated Creatinine Clearance: 46.4 mL/min (A) (by C-G formula based on SCr of 1.56 mg/dL (H)). No results for input(s): LIPASE, AMYLASE in the last 72 hours. No results for input(s): CKTOTAL, CKMB, CKMBINDEX, TROPONINI in the last 72 hours. Invalid input(s): POCBNP No results for input(s): DDIMER in the last 72 hours. No results for input(s): HGBA1C in the last 72  hours. No results for input(s): CHOL, HDL, LDLCALC, TRIG, CHOLHDL, LDLDIRECT in the last 72 hours. No results for input(s): TSH, T4TOTAL, T3FREE, THYROIDAB in the last 72 hours.  Invalid input(s): FREET3 No results for input(s): VITAMINB12, FOLATE, FERRITIN, TIBC, IRON, RETICCTPCT in the last 72 hours. Coags: Recent Labs    05/04/20 0206  INR 1.2   Microbiology: Recent Results (from the past 240 hour(s))  MRSA PCR Screening     Status: None   Collection Time: 04/29/20 12:54 PM   Specimen: Nasopharyngeal  Result Value Ref Range Status   MRSA by PCR NEGATIVE NEGATIVE Final    Comment:        The GeneXpert MRSA Assay (FDA approved for NASAL specimens only), is one component of a comprehensive MRSA colonization surveillance program. It is not intended to diagnose MRSA infection nor to guide or monitor treatment for MRSA infections. Performed at [facility], [place] [address] [place], [place] [address]     FURTHER DISCHARGE INSTRUCTIONS:  Get Medicines reviewed and adjusted: Please take all your medications with you for your next visit with your Primary MD  Laboratory/radiological data: Please request your Primary MD to go over all hospital  tests and procedure/radiological results at the follow up, please ask your Primary MD to get all Hospital records sent to his/her office.  In some cases, they will be blood work, cultures and biopsy results pending at the time of your discharge. Please request that your primary care M.D. goes through all the records of your hospital data and follows up on these results.  Also Note the following: If you experience worsening of your admission symptoms, develop shortness of breath, life threatening emergency, suicidal or homicidal thoughts you must seek medical attention immediately by calling 911 or calling your MD immediately  if symptoms less severe.  You must read complete instructions/literature along with all the possible adverse  reactions/side effects for all the Medicines you take and that have been prescribed to you. Take any new Medicines after you have completely understood and accpet all the possible adverse reactions/side effects.   Do not drive when taking Pain medications or sleeping medications (Benzodaizepines)  Do not take more than prescribed Pain, Sleep and Anxiety Medications. It is not advisable to combine anxiety,sleep and pain medications without talking with your primary care practitioner  Special Instructions: If you have smoked or chewed Tobacco  in the last 2 yrs please stop smoking, stop any regular Alcohol  and or any Recreational drug use.  Wear Seat belts while driving.  Please note: You were cared for by a hospitalist during your hospital stay. Once you are discharged, your primary care physician will handle any further medical issues. Please note that NO REFILLS for any discharge medications will be authorized once you are discharged, as it is imperative that you return to your primary care physician (or establish a relationship with a primary care physician if you do not have one) for your post hospital discharge needs so that they can reassess your need for medications and monitor your lab values.  Total Time spent coordinating discharge including counseling, education and face to face time equals 35 minutes.  SignedOren Binet 05/05/2020 1:47 PM

## 2020-05-05 NOTE — Progress Notes (Signed)
Occupational Therapy Treatment Patient Details Name: Corey Wood MRN: [id number] DOB: Aug 10, 1950 Today's Date: 05/05/2020    History of present illness Patient is a 70 y.o. male with PMHx of [name] recent history of upper GI bleeding on 1/20 requiring EGD with banding-returned to the hospital (1/28-1/30) with recurrent upper GI bleeding-repeat EGD with concern for bleeding from a banded ulcer-presented to the hospital on 2/1 with massive upper GI bleeding with hypovolemic shock.  Patient was resuscitated-transferred in the ICU-required repeat EGD and banding-subsequently stabilized-and transfer to the [facility] service.   OT comments  Pt progressing towards OT goals with BP readings stable throughout session today. Pt able to mobilize to sink and bathroom using RW at [place] with assist to turn and navigate obstacles. Pt noted with slower processing and difficulty problem solving hygiene after BM, requiring Max A for posterior hygiene. Plan to progress balance during LB ADLs and facilitate improved executive functioning during daily tasks. Continue to recommend SNF at this time though pt could progress well enough to go home with [facility] therapy and 24/7.   Lying: 113/64 Sitting: 109/83 Standing: 115/69 At end of session: 122/84   Follow Up Recommendations  SNF;Supervision/Assistance - 24 hour;Other (comment) (could progress to [facility] with 24/7)    Equipment Recommendations  None recommended by OT (well equipped)    Recommendations for Other Services      Precautions / Restrictions Precautions Precautions: Fall;Other (comment) Precaution Comments: monitor BP Restrictions Weight Bearing Restrictions: No       Mobility Bed Mobility Overal bed mobility: Needs Assistance Bed Mobility: Supine to Sit     Supine to sit: Min assist;HOB elevated     General bed mobility comments: Continues to require assist for trunk advancement to EOB despite use of bed rail. Pt reports  having a wedge to elevate HOB at home  Transfers Overall transfer level: Needs assistance Equipment used: Rolling walker (2 wheeled) Transfers: Sit to/from [facility] Sit to Stand: Min assist Stand pivot transfers: Min assist       General transfer comment: Min A for steadying to power up, cues for hand placement. Min A for turning with RW, navigating obstacles    Balance Overall balance assessment: Needs assistance Sitting-balance support: Feet supported;Single extremity supported Sitting balance-Leahy Scale: Fair     Standing balance support: Bilateral upper extremity supported;During functional activity Standing balance-Leahy Scale: Poor Standing balance comment: reliant on UE support for mobility                           ADL either performed or assessed with clinical judgement   ADL Overall ADL's : Needs assistance/impaired     Grooming: Min guard;Standing;Oral care Grooming Details (indicate cue type and reason): min guard in standing to ensure steadiness, slower problem solving noted but decent sequencing of tasks noted                 Toilet Transfer: Minimal assistance;Ambulation;Regular Toilet;Grab bars;RW [employer] technical [employer] Details (indicate cue type and reason): Min A for turning RW in/out of bathroom safely, navigating obstacles. Use of grab bar and RW Toileting- Clothing Manipulation and Hygiene: Maximal assistance;Sit to/from stand Toileting - Clothing Manipulation Details (indicate cue type and reason): Max A for hygiene, light assist for clothing mgmt. Difficulty problem solving for approach to wiping. Pt reports he sits for hygiene but then noted to stand up unassisted at toilet and difficulty reaching     Functional mobility  during ADLs: Minimal assistance;Rolling walker;Cueing for sequencing General ADL Comments: Pt with stable BP readings and decreased posterior bias today though continues with slower processing and problem  solving difficulties. Pt motivated to get better and go home     Vision   Vision Assessment?: No apparent visual deficits   Perception     Praxis      Cognition Arousal/Alertness: Awake/alert Behavior During Therapy: Flat affect Overall Cognitive Status: No family/caregiver present to determine baseline cognitive functioning Area of Impairment: Attention;Memory;Following commands;Safety/judgement;Problem solving;Awareness                   Current Attention Level: Selective Memory: Decreased short-term memory Following Commands: Follows one step commands with increased time;Follows one step commands consistently Safety/Judgement: Decreased awareness of safety;Decreased awareness of deficits Awareness: Emergent Problem Solving: Requires verbal cues;Difficulty sequencing;Slow processing General Comments: Pt cooperative, follows directions with increased time. Noted with slower processing, cues for safety needed as well as problem solving.        Exercises     Shoulder Instructions       General Comments Assessed BP readings, which were stable throughout session with pt denying dizziness. Lying: 113/64, sitting: 109/83, standing: 115/69 and at end of session: 122/84    Pertinent Vitals/ Pain       Pain Assessment: Faces Faces Pain Scale: Hurts little more Pain Location: L knee when weightbearing Pain Descriptors / Indicators: Grimacing;Sore Pain Intervention(s): Monitored during session  Home Living                                          Prior Functioning/Environment              Frequency  Min 2X/week        Progress Toward Goals  OT Goals(current goals can now be found in the care plan section)  Progress towards OT goals: Progressing toward goals  Acute Rehab OT Goals Patient Stated Goal: be able to go home, get out of bed OT Goal Formulation: With patient Time For Goal Achievement: 05/17/20 Potential to Achieve Goals:  Good ADL Goals Pt Will Perform Grooming: with modified independence;standing Pt Will Perform Lower Body Bathing: with supervision;sitting/lateral leans;sit to/from stand Pt Will Transfer to Toilet: with supervision;ambulating Pt Will Perform Toileting - Clothing Manipulation and hygiene: with modified independence;sit to/from stand;sitting/lateral leans Additional ADL Goal #1: Pt to verbalize at least 2 fall prevention strategies to maximize safety at home  Plan Discharge plan remains appropriate    Co-evaluation                 AM-PAC OT "6 Clicks" Daily Activity     Outcome Measure   Help from another person eating meals?: A Little Help from another person taking care of personal grooming?: A Little Help from another person toileting, which includes using toliet, bedpan, or urinal?: A Lot Help from another person bathing (including washing, rinsing, drying)?: A Lot Help from another person to put on and taking off regular upper body clothing?: A Little Help from another person to put on and taking off regular lower body clothing?: A Lot 6 Click Score: 15    End of Session Equipment Utilized During Treatment: Gait belt;Rolling walker  OT Visit Diagnosis: Unsteadiness on feet (R26.81);Other abnormalities of gait and mobility (R26.89);Muscle weakness (generalized) (M62.81);Other symptoms and signs involving cognitive function   Activity Tolerance Patient tolerated treatment  well   Patient Left in chair;with call bell/phone within reach   Nurse Communication Mobility status;Other (comment) (BP)        Time: 4585-9292 OT Time Calculation (min): 39 min  Charges: OT General Charges $OT Visit: 1 Visit OT Treatments $Therapeutic Activity: 23-37 mins $Neuromuscular Re-education: 8-22 mins  Layla Maw, OTR/L   Layla Maw 05/05/2020, 10:58 AM

## 2020-05-05 NOTE — Progress Notes (Signed)
PROGRESS NOTE                                                                                                                                                                                                             Patient Demographics:    Corey Wood, is a 70 y.o. male, DOB - June 18, 1950, GYB:[id number]  Outpatient Primary MD for the patient is Corey Milan, MD   Admit date - 04/29/2020   LOS - 6  Chief Complaint  Patient presents with  . Hematemesis       Brief Narrative: Patient is a 70 y.o. male with PMHx of Corey Wood cirrhosis-with recent history of upper GI bleeding on 1/20 requiring EGD with banding-returned to the hospital (1/28-1/30) with recurrent upper GI bleeding-repeat EGD with concern for bleeding from a banded ulcer-presented to the hospital on 2/1 with massive upper GI bleeding with hypovolemic shock.  Patient was resuscitated-transferred in the ICU-required repeat EGD and banding-subsequently stabilized-and transfer to the [facility].  See below for further details.  COVID-19 vaccinated status: Vaccinated and boosted   Significant Events: 1/20>>outpatient EGD with banding of large esophageal varices  1/28-1/30>> upper GI bleeding-hospitalized-EGD on 1/28-no stigmata of bleeding-banded ulcer 2/1>> recurrent upper GI bleeding with hemorrhagic shock-admit to ICU 2/2>> EGD: Multiple post banding ulcers-actively oozing blood-s/p 6 bands, blood in duodenum/stomach. 2/4>> transferred to [facility]  Significant studies: 2/3>> Echo: EF 87-68%, RV systolic function is low normal 2/3>> liver Doppler: No splenic varices, no portal vein occlusion/thrombus  COVID-19 medications:   Antibiotics: Rocephin: 2/1>>  Microbiology data: None  Procedures: 1/20>> EGD 1/28>> EGD 2/1>> EGD  Consults: GI, PCCM, IR  DVT prophylaxis: SCDs Start: 04/29/20 1157     Subjective:   Brown stools  yesterday-acknowledges that he is weak-but wants to go home with home health services rather than SNF.  However spouse wants him to go to SNF.  No major issues overnight per nursing staff.   Assessment  & Plan :   Upper GI bleedingdue to esophageal varices-with acute blood loss anemia and hemorrhagic shock: Hemorrhagic shock resolved-hemoglobin relatively stable after multiple units of PRBC transfusion-stools are now brown in color per patient.  Hemoglobin relatively stable over the past few days.  Completed octreotide/PPI infusion-now on twice daily dosing of PPI and Carafate.  Remains on Rocephin empirically.  Await GI recommendation today.  Per GI-if patient rebleeds-needs TIPS procedure.  AKI on CKD stage IIIa: AKI hemodynamically mediated-due to shock-creatinine significantly better and now back to baseline.  Per patient/family patient has a nonfunctioning left kidney.    Acute metabolic encephalopathy: Due to hypotension/AKI-improved-encephalopathy has resolved.    History of Corey Wood with liver cirrhosis: Relatively well compensated with the exception for variceal bleeding  Myelodysplastic syndrome: Follow CBC closely and transfuse as needed  Thrombocytopenia: Likely secondary to hypersplenism-has been relatively stable over the past few days.  Follow.  COPD/OSA: Stable-continue bronchodilators  BPH: Continue Flomax  Depression: Stable-continue trazodone, Celexa and Wellbutrin  HLD: Continue to hold statin-LFTs elevated  HTN: Continue to hold losartan-BP stable-resume when able  DM-2: CBGs stable-continue SSI  Recent Labs    05/05/20 0024 05/05/20 0431 05/05/20 0754  GLUCAP 167* 140* 140*   COVID-19 infection: Asymptomatic-fully vaccinated and boosted-continue to watch closely.  Needs 10 days of isolation from 2/28.  Debility/deconditioning: Due to acute illness-evaluated by PT-recommendations of SNF.  Will place social work consult today.    ABG:    Component Value  Date/Time   PHART 7.454 (H) 04/29/2020 1625   PCO2ART 36.7 04/29/2020 1625   PO2ART 376 (H) 04/29/2020 1625   HCO3 25.8 04/29/2020 1625   TCO2 27 04/29/2020 1625   O2SAT 100.0 04/29/2020 1625    Vent Settings: N/A    Condition - Extremely Guarded  Family Communication  :  Spouse-Sharon-[phone]  on 2/6  Code Status :  Full Code  Diet :  Diet Order            Diet full liquid Room service appropriate? Yes; Fluid consistency: Thin  Diet effective now                  Disposition Plan  :   Status is: Inpatient  Remains inpatient appropriate because:Inpatient level of care appropriate due to severity of illness   Dispo: The patient is from: Home              Anticipated d/c is to: Home              Anticipated d/c date is: 2 days              Patient currently is not medically stable to d/c.   Difficult to place patient No   Barriers to discharge: Severe upper GI bleeding due to variceal bleeding-at high risk for rebleeding-needs continued inpatient monitoring-also awaiting SNF bed.  Antimicorbials  :    Anti-infectives (From admission, onward)   Start     Dose/Rate Route Frequency Ordered Stop   04/30/20 0930  cefTRIAXone (ROCEPHIN) 1 g in sodium chloride 0.9 % 100 mL IVPB        1 g 200 mL/hr over 30 Minutes Intravenous Every 24 hours 04/30/20 0847 05/05/20 0913   04/30/20 0800  cefTRIAXone (ROCEPHIN) 1 g in sodium chloride 0.9 % 100 mL IVPB  Status:  Discontinued        1 g 200 mL/hr over 30 Minutes Intravenous Every 24 hours 04/29/20 0922 04/30/20 0847   04/29/20 0730  cefTRIAXone (ROCEPHIN) 1 g in sodium chloride 0.9 % 100 mL IVPB        1 g 200 mL/hr over 30 Minutes Intravenous  Once 04/29/20 8921 04/29/20 1941      Inpatient Medications  Scheduled Meds: . (feeding supplement) PROSource Plus  30 mL Oral BID BM  . buPROPion  150 mg Oral Daily  . chlorhexidine gluconate (MEDLINE  KIT)  15 mL Mouth Rinse BID  . Chlorhexidine Gluconate Cloth  6 each  Topical Q0600  . citalopram  40 mg Oral Daily  . feeding supplement (GLUCERNA SHAKE)  237 mL Oral TID BM  . fluticasone  2 spray Each Nare Daily  . insulin aspart  0-20 Units Subcutaneous Q4H  . ipratropium  2 spray Each Nare QHS  . ketotifen  1 drop Both Eyes QHS  . multivitamin with minerals  1 tablet Oral Daily  . pantoprazole  40 mg Intravenous Q12H  . sodium chloride flush  10-40 mL Intracatheter Q12H  . sucralfate  1 g Oral Q6H  . tamsulosin  0.4 mg Oral BID  . traZODone  75 mg Oral QHS  . vitamin B-12  500 mcg Oral QODAY   Continuous Infusions: . ferumoxytol Stopped (04/30/20 1047)   PRN Meds:.albuterol, oxyCODONE, sodium chloride flush   Time Spent in minutes  25  See all Orders from today for further details   Oren Binet M.D on 05/05/2020 at 9:40 AM  To page go to [email or web address] - use universal password  [facility] -  Office  [phone]    Objective:   Vitals:   05/04/20 1617 05/04/20 1957 05/05/20 0433 05/05/20 0511  BP: 104/64 135/70  104/60  Pulse: 69 79  60  Resp: '20 20  20  ' Temp: 98.8 F (37.1 C) 99.2 F (37.3 C)  97.9 F (36.6 C)  TempSrc:  Oral  Oral  SpO2: 98% 98%  93%  Weight:   89.7 kg   Height:        Wt Readings from Last 3 Encounters:  05/05/20 89.7 kg  04/25/20 77.1 kg  04/17/20 77.1 kg    No intake or output data in the 24 hours ending 05/05/20 0940   Physical Exam Gen Exam:Alert awake-not in any distress HEENT:atraumatic, normocephalic Chest: B/L clear to auscultation anteriorly CVS:S1S2 regular Abdomen:soft non tender, non distended Extremities:no edema Neurology: Non focal Skin: no rash   Data Review:    CBC Recent Labs  Lab 05/02/20 0708 05/02/20 1633 05/03/20 0203 05/04/20 0206 05/05/20 0151  WBC 3.0* 3.4* 3.7* 3.8* 3.7*  HGB 7.3* 8.1* 7.2* 7.6* 7.3*  HCT 22.5* 24.9* 21.7* 23.0* 21.0*  PLT 38* 40* 40* 49* 55*  MCV 94.5 95.0 93.1 93.5 93.3  MCH 30.7 30.9 30.9 30.9 32.4  MCHC 32.4 32.5 33.2  33.0 34.8  RDW 15.5 15.4 15.1 15.4 16.3*    Chemistries  Recent Labs  Lab 04/30/20 0457 05/02/20 0708 05/02/20 1715 05/03/20 0203 05/04/20 0206 05/05/20 0151  NA 136 139  --  136 137 135  K 4.1 4.0  --  4.0 3.9 3.6  CL 104 108  --  103 103 103  CO2 20* 25  --  '26 27 26  ' GLUCOSE 287* 134* 410* 169* 201* 165*  BUN 34* 34*  --  33* 29* 25*  CREATININE 2.14* 1.78*  --  1.61* 1.57* 1.56*  CALCIUM 7.7* 8.4*  --  8.2* 8.3* 8.3*  AST 88* 84*  --  45* 32 27  ALT 77* 179*  --  129* 92* 66*  ALKPHOS 48 50  --  58 62 63  BILITOT 1.0 0.7  --  0.6 0.9 0.5   ------------------------------------------------------------------------------------------------------------------ No results for input(s): CHOL, HDL, LDLCALC, TRIG, CHOLHDL, LDLDIRECT in the last 72 hours.  Lab Results  Component Value Date   HGBA1C 6.6 (H) 04/29/2020   ------------------------------------------------------------------------------------------------------------------ No results for input(s): TSH, T4TOTAL, T3FREE,  THYROIDAB in the last 72 hours.  Invalid input(s): FREET3 ------------------------------------------------------------------------------------------------------------------ No results for input(s): VITAMINB12, FOLATE, FERRITIN, TIBC, IRON, RETICCTPCT in the last 72 hours.  Coagulation profile Recent Labs  Lab 04/29/20 0815 04/30/20 0457 05/04/20 0206  INR 1.7* 1.5* 1.2    No results for input(s): DDIMER in the last 72 hours.  Cardiac Enzymes No results for input(s): CKMB, TROPONINI, MYOGLOBIN in the last 168 hours.  Invalid input(s): CK ------------------------------------------------------------------------------------------------------------------ No results found for: BNP  Micro Results Recent Results (from the past 240 hour(s))  MRSA PCR Screening     Status: None   Collection Time: 04/29/20 12:54 PM   Specimen: Nasopharyngeal  Result Value Ref Range Status   MRSA by PCR NEGATIVE  NEGATIVE Final    Comment:        The GeneXpert MRSA Assay (FDA approved for NASAL specimens only), is one component of a comprehensive MRSA colonization surveillance program. It is not intended to diagnose MRSA infection nor to guide or monitor treatment for MRSA infections. Performed at [facility], [place] [address] [place], [place] [address]     Radiology Reports DG CHEST PORT 1 VIEW  Result Date: 04/29/2020 CLINICAL DATA:  Central line placement EXAM: PORTABLE CHEST 1 VIEW COMPARISON:  04/29/2020 chest radiograph. FINDINGS: Right internal jugular central venous catheter terminates in lower third of the SVC. Left internal jugular central venous catheter terminates in high left mediastinum at the superior margin of the aortic arch in an indeterminate position. Pacer pads overlie chest bilaterally. Stable cardiomediastinal silhouette with top-normal heart size. No pneumothorax. Stable small left pleural effusion. No right pleural effusion. No pulmonary edema. Stable mild left basilar hazy opacity, favor atelectasis. IMPRESSION: 1. Right internal jugular central venous catheter terminates in the lower third of the SVC. 2. Left internal jugular central venous catheter terminates in the high left mediastinum at the superior margin of the aortic arch in an indeterminate position, suspicious for arterial placement as previously mentioned. 3. Stable small left pleural effusion and mild left basilar hazy opacity, favor atelectasis. Critical Value/emergent results were called by telephone at the time of interpretation on 04/29/2020 at 11:12 am to provider DR. CHANDS, who verbally acknowledged these results. The left neck catheter has been removed by the time of this interpretation. Electronically Signed   By: Ilona Sorrel M.D.   On: 04/29/2020 11:13   DG Chest Port 1 View  Result Date: 04/29/2020 CLINICAL DATA:  Redressed mint of central line. EXAM: PORTABLE CHEST 1 VIEW COMPARISON:  04/27/2020  FINDINGS: Left internal jugular central venous catheter with the tip kinked and projecting at the superior aspect of the aortic arch. Similar cardiomediastinal silhouette. Similar small left pleural effusion with overlying opacities. No visible pneumothorax on this limited supine radiograph. IMPRESSION: 1. Left internal jugular central venous catheter with the tip kinked and projecting at the superior aspect of the aortic arch. While this could represent a venous placement, arterial placement cannot be excluded on this image. Recommend correlation with blood gas analysis. 2. Similar small left pleural effusion with overlying opacities, which could represent atelectasis, aspiration, and/or pneumonia. Findings and recommendations discussed with Dr. Christy Gentles via telephone at 8:34 a.m. Electronically Signed   By: Margaretha Sheffield MD   On: 04/29/2020 08:38   DG Chest Portable 1 View  Result Date: 04/29/2020 CLINICAL DATA:  Line placement EXAM: PORTABLE CHEST 1 VIEW COMPARISON:  April 26, 2020 FINDINGS: The cardiomediastinal silhouette is unchanged and mildly enlarged in contour.LEFT IJ CVC tip terminates  over the expected region of the LEFT brachiocephalic vein confluence with the subclavian. Small LEFT pleural effusion, unchanged. No pneumothorax. LEFT retrocardiac opacity, possibly atelectasis. Visualized abdomen is unremarkable. No acute osseous abnormality. IMPRESSION: LEFT IJ CVC tip terminates over the expected region of the LEFT brachiocephalic vein confluence with the subclavian. No pneumothorax. Electronically Signed   By: Valentino Saxon MD   On: 04/29/2020 07:54   DG CHEST PORT 1 VIEW  Result Date: 04/26/2020 CLINICAL DATA:  Hemoptysis EXAM: PORTABLE CHEST 1 VIEW COMPARISON:  11/06/2019 FINDINGS: Small left pleural effusion. Generous heart size accentuated by technique. No air bronchogram or pulmonary edema. No pneumothorax. Extensive artifact from EKG leads. IMPRESSION: Small left pleural  effusion. Electronically Signed   By: Monte Fantasia M.D.   On: 04/26/2020 07:50   DG Foot 2 Views Left  Result Date: 04/29/2020 CLINICAL DATA:  Fall. EXAM: LEFT FOOT - 2 VIEW COMPARISON:  None. FINDINGS: No acute fracture or dislocation is identified. There are large posterior and plantar calcaneal enthesophytes. There is the suggestion of soft tissue swelling at the medial aspect of the ankle. Atherosclerotic vascular calcifications are noted. IMPRESSION: No acute osseous abnormality identified. Electronically Signed   By: Logan Bores M.D.   On: 04/29/2020 13:20   US LIVER DOPPLER  Result Date: 05/02/2020 CLINICAL DATA:  Cirrhosis EXAM: DUPLEX ULTRASOUND OF LIVER TECHNIQUE: Color and duplex Doppler ultrasound was performed to evaluate the hepatic in-flow and out-flow vessels. COMPARISON:  CT abdomen pelvis 11/07/2019 FINDINGS: Liver: Normal parenchymal echogenicity. Normal hepatic contour without nodularity. No focal lesion, mass or intrahepatic biliary ductal dilatation. Main Portal Vein size: 1.3 cm Portal Vein Velocities Main Prox:  34 cm/sec Main Mid: 22 cm/sec Main Dist:  21 cm/sec Right: 22 cm/sec Left: 17 cm/sec Hepatic Vein Velocities Right:  20 cm/sec Middle:  17 cm/sec Left:  18 cm/sec IVC: Present and patent with normal respiratory phasicity. Hepatic Artery Velocity:  98 cm/sec Splenic Vein Velocity:  13 cm/sec Spleen: 18.6 x 10.1 x 7.1 cm with a total volume of 695 cm^3 (411 cm^3 is upper limit normal) Portal Vein Occlusion/Thrombus: No Splenic Vein Occlusion/Thrombus: No Ascites: None Varices: None Hydronephrotic a atrophic left kidney partially visualized. IMPRESSION: No definite splenic varices identified. The spleen is enlarged. Splenic vein is patent. If there is continued concern for splenic varices, further evaluation with contrast enhanced abdominal CT should be performed. Electronically Signed   By: Miachel Roux M.D.   On: 05/02/2020 08:11   ECHOCARDIOGRAM LIMITED  Result Date:  05/01/2020    ECHOCARDIOGRAM REPORT   Patient Name:   Corey Wood Date of Exam: 05/01/2020 Medical Rec #:  [id number]      Height:       67.0 in Accession #:    [id number]     Weight:       190.7 lb Date of Birth:  1950-05-31      BSA:          1.982 m Patient Age:    16 years       BP:           141/66 mmHg Patient Gender: M              HR:           63 bpm. Exam Location:  Inpatient Procedure: Limited Echo, Limited Color Doppler and Cardiac Doppler Indications:    esophageal varices  History:        Patient has no prior history of Echocardiogram  examinations.                 Covid; Risk Factors:Diabetes.  Sonographer:    Johny Chess Referring Phys: [id number] [place]  1. Left ventricular ejection fraction, by estimation, is 60 to 65%. The left ventricle has normal function. The left ventricle has no regional wall motion abnormalities. Left ventricular diastolic parameters were normal.  2. Right ventricular systolic function is low normal. The right ventricular size is mildly enlarged. There is normal pulmonary artery systolic pressure.  3. The mitral valve is normal in structure. Trivial mitral valve regurgitation.  4. The aortic valve is abnormal. Aortic valve regurgitation is mild. Mild to moderate aortic valve sclerosis/calcification is present, without any evidence of aortic stenosis.  5. Aortic dilatation noted. There is mild dilatation of the ascending aorta, measuring 39 mm.  6. The inferior vena cava is normal in size with greater than 50% respiratory variability, suggesting right atrial pressure of 3 mmHg. FINDINGS  Left Ventricle: Left ventricular ejection fraction, by estimation, is 60 to 65%. The left ventricle has normal function. The left ventricle has no regional wall motion abnormalities. The left ventricular internal cavity size was normal in size. There is  no left ventricular hypertrophy. Left ventricular diastolic parameters were normal. Right Ventricle: The right  ventricular size is mildly enlarged. Right vetricular wall thickness was not assessed. Right ventricular systolic function is low normal. There is normal pulmonary artery systolic pressure. The tricuspid regurgitant velocity is  2.48 m/s, and with an assumed right atrial pressure of 3 mmHg, the estimated right ventricular systolic pressure is 76.2 mmHg. Left Atrium: Left atrial size was normal in size. Right Atrium: Right atrial size was normal in size. Pericardium: There is no evidence of pericardial effusion. Mitral Valve: The mitral valve is normal in structure. There is mild thickening of the mitral valve leaflet(s). Trivial mitral valve regurgitation. Tricuspid Valve: The tricuspid valve is normal in structure. Tricuspid valve regurgitation is mild. Aortic Valve: The aortic valve is abnormal. Aortic valve regurgitation is mild. Aortic regurgitation PHT measures 679 msec. Mild to moderate aortic valve sclerosis/calcification is present, without any evidence of aortic stenosis. Aortic valve mean gradient measures 12.0 mmHg. Aortic valve peak gradient measures 20.1 mmHg. Aortic valve area, by VTI measures 2.11 cm. Pulmonic Valve: The pulmonic valve was normal in structure. Pulmonic valve regurgitation is not visualized. Aorta: Aortic dilatation noted. There is mild dilatation of the ascending aorta, measuring 39 mm. Venous: The inferior vena cava is normal in size with greater than 50% respiratory variability, suggesting right atrial pressure of 3 mmHg. IAS/Shunts: The interatrial septum was not assessed.  LEFT VENTRICLE PLAX 2D LVIDd:         5.10 cm  Diastology LVIDs:         3.20 cm  LV e' medial:   8.38 cm/s LV PW:         1.00 cm  LV E/e' medial: 8.3 LV IVS:        0.90 cm LVOT diam:     2.00 cm LV SV:         99 LV SV Index:   50 LVOT Area:     3.14 cm  IVC IVC diam: 1.70 cm LEFT ATRIUM         Index LA diam:    3.80 cm 1.92 cm/m  AORTIC VALVE AV Area (Vmax):    1.92 cm AV Area (Vmean):   1.76 cm AV  Area (VTI):  2.11 cm AV Vmax:           224.00 cm/s AV Vmean:          163.000 cm/s AV VTI:            0.470 m AV Peak Grad:      20.1 mmHg AV Mean Grad:      12.0 mmHg LVOT Vmax:         137.00 cm/s LVOT Vmean:        91.300 cm/s LVOT VTI:          0.316 m LVOT/AV VTI ratio: 0.67 AI PHT:            679 msec  AORTA Ao Root diam: 3.20 cm Ao Asc diam:  3.90 cm MITRAL VALVE               TRICUSPID VALVE MV Area (PHT): 2.76 cm    TR Peak grad:   24.6 mmHg MV Decel Time: 275 msec    TR Vmax:        248.00 cm/s MV E velocity: 69.80 cm/s MV A velocity: 82.70 cm/s  SHUNTS MV E/A ratio:  0.84        Systemic VTI:  0.32 m                            Systemic Diam: 2.00 cm Dorris Carnes MD Electronically signed by Dorris Carnes MD Signature Date/Time: 05/01/2020/7:58:50 PM    Final

## 2020-05-05 NOTE — Progress Notes (Signed)
Progress Note   Subjective  Patient passing brown stool, no further bleeding. Hgb stable. He wants to leave the hospital as soon as he can. Tolerating full liquids at this time.      Objective   Vital signs in last 24 hours: Temp:  [97.9 F (36.6 C)-99.2 F (37.3 C)] 97.9 F (36.6 C) (02/07 0511) Pulse Rate:  [60-79] 60 (02/07 0511) Resp:  [20] 20 (02/07 0511) BP: (104-135)/(60-70) 104/60 (02/07 0511) SpO2:  [93 %-98 %] 93 % (02/07 0511) Weight:  [89.7 kg] 89.7 kg (02/07 0433) Last BM Date: 05/01/20 General:    white male in NAD Neurologic:  Alert and oriented,  grossly normal neurologically. Psych:  Cooperative. Normal mood and affect.  Intake/Output from previous day: No intake/output data recorded. Intake/Output this shift: No intake/output data recorded.  Lab Results: Recent Labs    05/03/20 0203 05/04/20 0206 05/05/20 0151  WBC 3.7* 3.8* 3.7*  HGB 7.2* 7.6* 7.3*  HCT 21.7* 23.0* 21.0*  PLT 40* 49* 55*   BMET Recent Labs    05/03/20 0203 05/04/20 0206 05/05/20 0151  NA 136 137 135  K 4.0 3.9 3.6  CL 103 103 103  CO2 26 27 26   GLUCOSE 169* 201* 165*  BUN 33* 29* 25*  CREATININE 1.61* 1.57* 1.56*  CALCIUM 8.2* 8.3* 8.3*   LFT Recent Labs    05/05/20 0151  PROT 5.3*  ALBUMIN 2.2*  AST 27  ALT 66*  ALKPHOS 63  BILITOT 0.5   PT/INR Recent Labs    05/04/20 0206  LABPROT 14.4  INR 1.2    Studies/Results: No results found.     Assessment / Plan:   70 y/o male well known to me, cirrhosis with extremely large esophageal varices. He had been initially placed on propranolol but had symptomatic bradycardia from it and did not tolerate it. We then performed elective band ligation of the varices as outpatient to treat them as primary therapy on 04/17/20. He unfortunately developed bleeding from banding ulcer site and admitted 5 days later. Repeat EGD on 1/28 showed post banding ulcers as expected without high risk pathology and he was  discharged home. Unfortunately readmitted again with severe bleeding, repeat EGD 2/2 showed actively bleeding post banding ulcer, additional 6 bands were placed.  Since the last EGD he has done okay, now recovering, no recent bleeding and Hgb stable. Finishing ABx and octreotide drip, now on PPI. I have discussed options with him moving forward in regards to treatment of large varices - continue serial EGD with band ligation until complete eradication vs. TIPS procedure. I have discussed risks / benefits of each of these with him. He wants to go home or to rehab and get out of the hospital without further interventions at this time if he is otherwise stable, which I think is okay. I can see him in the office in the next 2 weeks and also have him follow up with his Hepatologist as well to discuss repeat options EGD vs. TIPS (He is followed by [facility]). I think he may pursue follow up EGD with me for this, as he did not have spontaneous variceal bleeding, he had a post banding ulcer bleed, this is not common and hopefully risk of recurrent bleeding is low moving forward. Should he have recurrent over bleeding however he may opt for TIPS.  I have tried calling his wife a few times today and no luck reaching her yet, will try back again  later. I spoke with Dr. Sloan Leiter hospitalist service about plan who agreed, if he goes to rehab SNF will need social work assistance to help coordinate.  Plan: - finished antibiotics and octreotide drip - continue protonix 40mg  PO BID for now - will plan outpatient follow up with me within 2 weeks or so, and try to coordinate outpatient follow up with his Hepatologist as well - anticipate repeat EGD with me in 3-4 weeks at the hospital for repeat banding, unless he has bleeding sooner or wishes to have elective TIPS, he wishes to hold off on TIPS right now  Call with questions.  Miles Cellar, MD [facility]

## 2020-05-05 NOTE — Progress Notes (Signed)
Physical Therapy Treatment Patient Details Name: Corey Wood MRN: [id number] DOB: 1950-09-17 Today's Date: 05/05/2020    History of Present Illness Patient is a 70 y.o. male with PMHx of [name] recent history of upper GI bleeding on 1/20 requiring EGD with banding-returned to the hospital (1/28-1/30) with recurrent upper GI bleeding-repeat EGD with concern for bleeding from a banded ulcer-presented to the hospital on 2/1 with massive upper GI bleeding with hypovolemic shock.  Patient was resuscitated-transferred in the ICU-required repeat EGD and banding-subsequently stabilized-and transfer to the [facility] hospitalist service.    PT Comments    Pt very pleasant and very willing to walk in order to progress function toward D/C. Pt with significantly improved mobility without significant drop in BP with positional changes this session and pt able to progress gait. Encouraged mobility with nursing.  Sitting 89/73 (79) Standing 91/77 (84) After gait 109/70(82)  Follow Up Recommendations  Home health PT;Supervision/Assistance - 24 hour     Equipment Recommendations  Rolling walker with 5" wheels    Recommendations for Other Services       Precautions / Restrictions Precautions Precautions: Fall;Other (comment) Precaution Comments: monitor BP Restrictions Weight Bearing Restrictions: No    Mobility  Bed Mobility Overal bed mobility: Needs Assistance Bed Mobility: Supine to Sit     Supine to sit: Min assist;HOB elevated     General bed mobility comments: pt in chair on arrival  Transfers Overall transfer level: Needs assistance Equipment used: Rolling walker (2 wheeled) Transfers: Sit to/from Stand Sit to Stand: Min guard Stand pivot transfers: Min assist       General transfer comment: cues for hand placement  Ambulation/Gait Ambulation/Gait assistance: Min guard Gait Distance (Feet): 200 Feet Assistive device: Rolling walker (2 wheeled) Gait  Pattern/deviations: Step-through pattern;Decreased stride length;Trunk flexed   Gait velocity interpretation: <1.8 ft/sec, indicate of risk for recurrent falls General Gait Details: cues for posture and stability with pt able to walk in hall with use of RW   Stairs             Wheelchair Mobility    Modified Rankin (Stroke Patients Only)       Balance Overall balance assessment: Needs assistance Sitting-balance support: Feet supported;Single extremity supported Sitting balance-Leahy Scale: Good     Standing balance support: Bilateral upper extremity supported;During functional activity Standing balance-Leahy Scale: Fair Standing balance comment: reliant on UE support for mobility                            Cognition Arousal/Alertness: Awake/alert Behavior During Therapy: Flat affect Overall Cognitive Status: Impaired/Different from baseline Area of Impairment: Memory                   Current Attention Level: Selective Memory: Decreased short-term memory Following Commands: Follows one step commands with increased time;Follows one step commands consistently Safety/Judgement: Decreased awareness of safety;Decreased awareness of deficits Awareness: Emergent Problem Solving: Requires verbal cues;Difficulty sequencing;Slow processing General Comments: Pt cooperative, follows directions with increased time. Noted with slower processing, cues for safety needed as well as problem solving.      Exercises General Exercises - Lower Extremity Long Arc Quad: AROM;Both;Seated;20 reps Hip Flexion/Marching: AROM;Both;Seated;20 reps    General Comments General comments (skin integrity, edema, etc.): Assessed BP readings, which were stable throughout session with pt denying dizziness. Lying: 113/64, sitting: 109/83, standing: 115/69 and at end of session: 122/84      Pertinent Vitals/Pain  Pain Assessment: No/denies pain Faces Pain Scale: Hurts little  more Pain Location: L knee when weightbearing Pain Descriptors / Indicators: Grimacing;Sore Pain Intervention(s): Monitored during session    Home Living                      Prior Function            PT Goals (current goals can now be found in the care plan section) Acute Rehab PT Goals Patient Stated Goal: be able to go home, get out of bed Progress towards PT goals: Progressing toward goals    Frequency           PT Plan Current plan remains appropriate    Co-evaluation              AM-PAC PT "6 Clicks" Mobility   Outcome Measure  Help needed turning from your back to your side while in a flat bed without using bedrails?: None Help needed moving from lying on your back to sitting on the side of a flat bed without using bedrails?: None Help needed moving to and from a bed to a chair (including a wheelchair)?: A Little Help needed standing up from a chair using your arms (e.g., wheelchair or bedside chair)?: A Little Help needed to walk in hospital room?: A Little Help needed climbing 3-5 steps with a railing? : A Little 6 Click Score: 20    End of Session   Activity Tolerance: Patient tolerated treatment well Patient left: in chair;with call bell/phone within reach Nurse Communication: Mobility status PT Visit Diagnosis: Difficulty in walking, not elsewhere classified (R26.2);Muscle weakness (generalized) (M62.81);Other abnormalities of gait and mobility (R26.89)     Time: 2683-4196 PT Time Calculation (min) (ACUTE ONLY): 30 min  Charges:  $Gait Training: 8-22 mins $Therapeutic Exercise: 8-22 mins                     Tarquin Welcher P, PT Acute Rehabilitation Services Pager: [phone] Office: [place] 05/05/2020, 1:17 PM

## 2020-05-05 NOTE — Discharge Instructions (Signed)
Esophageal Varices  Esophageal varices are enlarged veins in the part of the body that moves food from the mouth to the stomach (esophagus). They develop when extra blood is forced to flow through these veins because the blood's normal flow is blocked. Without treatment, esophageal varices eventually break and bleed (hemorrhage), which can be life-threatening. What are the causes? This condition may be caused by:  Scarring of the liver due to alcoholism. This is the most common cause.  Long-term liver disease.  Severe heart failure.  A blood clot in a vein that supplies the liver.  A disease that causes inflammation in the organs and other body areas. What are the signs or symptoms? Esophageal varices usually do not cause symptoms unless they start to bleed. Symptoms of bleeding esophageal varices include:  Vomiting material that is bright red or that is black and looks like coffee grounds.  Coughing up blood.  Stools (feces) that look black and tarry.  Dizziness or light-headedness.  Low blood pressure.  Loss of consciousness. How is this diagnosed? This condition is diagnosed with a procedure called endoscopy. During endoscopy, your health care provider uses a flexible tube with a small camera on the end of it (endoscope) to look down your throat and examine your esophagus. You may also have other tests, including:  Imaging tests, such as a CT scan or ultrasound.  Blood tests. How is this treated? This condition may be treated with:  Medicines. Medicines are usually used to treat varices that are not bleeding.  Procedures. Procedures are done to treat varices that are bleeding. They stop bleeding, or reduce pressure and the risk of bleeding. Procedures include: ? Placing an elastic band around the varices to keep them from bleeding. ? Replacing blood that you have lost due to bleeding. This may include getting a transfusion of blood or parts of blood, such as platelets or  clotting factors. ? You may be given antibiotic medicine to help prevent infection. ? Getting an injection into the varices that causes it to shrink and close (sclerotherapy). You may also be given medicines that tighten blood vessels or change blood flow. ? Placing a balloon in the esophagus and inflating it. The balloon applies pressure to the bleeding veins to help stop the bleeding. ? Placing a small tube within the veins in the liver. This decreases blood flow and pressure in the esophageal varices. If other treatments do not work, you may need a liver transplant. Follow these instructions at home: Medicines  Take over-the-counter and prescription medicines only as told by your health care provider.  If you were prescribed an antibiotic medicine, take it as told by your health care provider. Do not stop taking the antibiotic even if you start to feel better.  Do not take any NSAIDs (such as aspirin or ibuprofen) before first getting approval from your health care provider. General instructions  Do not drink alcohol.  Return to your normal activities as told by your health care provider. Ask your health care provider what activities are safe for you.  Avoid vigorous physical activity. Ask your health care provider what exercises are safe for you.  Keep all follow-up visits.   Contact a health care provider if:  You have pain in the abdomen.  You are unable to eat or drink. Get help right away if:  You vomit blood or have blood in your stool.  You have stools that look black or tarry.  You have chest pain.  You feel  dizzy or have low blood pressure.  You lose consciousness. These symptoms may represent a serious problem that is an emergency. Do not wait to see if the symptoms will go away. Get medical help right away. Call your local emergency services (911 in the [place] Do not drive yourself to the hospital. Summary  Esophageal varices are enlarged veins in the  esophagus, the part of your body that moves food from your mouth to your stomach.  Without treatment, esophageal varices eventually break and bleed, which can be life-threatening.  Esophageal varices usually do not cause symptoms unless they start to bleed.  Keep all follow-up visits. This is important. This information is not intended to replace advice given to you by your health care provider. Make sure you discuss any questions you have with your health care provider. Document Revised: 07/03/2019 Document Reviewed: 07/03/2019 Elsevier Patient Education  [employer].

## 2020-05-05 NOTE — TOC Progression Note (Addendum)
Transition of Care [facility]) - Progression Note    Patient Details  Name: Corey Wood MRN: [id number] Date of Birth: [birth year]  Transition of Care [facility]) CM/SW Contact  Angelita Ingles, RN Phone Number: [phone]  05/05/2020, 10:38 AM  Clinical Narrative:    CM received call from wife stating that she wants help for her husband before he comes home. Wife explains that she would like  husband to get rehab to help with his strength because he falls frequently at home and she cant handle him. Wife states that she does not want her husband to go to a facility that has covid patients. CM has explained to wife that CM can not guarantee that the facility will not have covid patients. Wife states that she will need to speak with her children about this and will call CM back with a decision about placement. CM spoke with patient who is agreeable to do whatever it takes but wants to be home in about a week.  53 Wife returned call and states that she wants to bring husband home with home health versus SNF.   Expected Discharge Plan: Home/Self Care Barriers to Discharge: Continued Medical Work up  Expected Discharge Plan and Services Expected Discharge Plan: Home/Self Care       Living arrangements for the past 2 months: Single Family Home                                       Social Determinants of Health (SDOH) Interventions    Readmission Risk Interventions Readmission Risk Prevention Plan 05/02/2020  Transportation Screening Complete  PCP or Specialist Appt within 3-5 Days Complete  HRI or [place] Complete  Social Work Consult for [place] Planning/Counseling Complete  Palliative Care Screening Not Applicable  Medication Review Press photographer) Referral to Pharmacy  Some recent data might be hidden

## 2020-05-05 NOTE — TOC Transition Note (Signed)
Transition of Care [facility]) - CM/SW Discharge Note   Patient Details  Name: MARKELL SCIASCIA MRN: [id number] Date of Birth: 1950-06-17  Transition of Care [facility]) CM/SW Contact:  Angelita Ingles, RN Phone Number: [phone]  05/05/2020, 1:57 PM   Clinical Narrative:    Patient to be discharged home with Home Health services. Choice offered. Home health has been set up with [facility]. Rolling walker to be delivered by [place]. Scripts sent to [facility] and will be delivered. No other needs noted at this time. TOC will sign off.     Final next level of care: [place] Barriers to Discharge: No Barriers Identified   Patient Goals and CMS Choice Patient states their goals for this hospitalization and ongoing recovery are:: Wants to get better to home in about a week. CMS Medicare.gov Compare Post Acute Care list provided to:: Patient Choice offered to / list presented to : [facility]  Discharge Placement                       Discharge Plan and Services                DME Arranged: [place]ford Rile rolling DME Agency: [employer] Date DME Agency Contacted: 05/05/20 Time DME Agency Contacted: [phone] Representative spoke with at DME Agency: [place]: RN,PT,OT [place] Agency: [facility] (now [facility]) Date [place]: 05/05/20 Time [place]: [id number] Representative spoke with at [place]: [place]  Social Determinants of Health ([place]) Interventions     Readmission Risk Interventions Readmission Risk Prevention Plan 05/02/2020  Transportation Screening Complete  PCP or Specialist Appt within 3-5 Days Complete  HRI or [place] Complete  Social Work Consult for [place] Planning/Counseling [place] Screening Not Applicable  Medication Review Press photographer) Referral to Pharmacy  Some recent data might be hidden

## 2020-05-07 NOTE — Progress Notes (Signed)
CM received message from [place] requesting that H&P, orders, PT/OT evals and d/c summary be faxed to [place]. Info has been faxed to Dr. Marjo Bicker [phone]. Info has been faxed.

## 2020-05-13 ENCOUNTER — Encounter: Payer: Self-pay | Admitting: Gastroenterology

## 2020-05-13 ENCOUNTER — Other Ambulatory Visit (INDEPENDENT_AMBULATORY_CARE_PROVIDER_SITE_OTHER): Payer: No Typology Code available for payment source

## 2020-05-13 ENCOUNTER — Ambulatory Visit (INDEPENDENT_AMBULATORY_CARE_PROVIDER_SITE_OTHER): Payer: No Typology Code available for payment source | Admitting: Gastroenterology

## 2020-05-13 VITALS — BP 124/70 | HR 85 | Ht 67.0 in | Wt 190.0 lb

## 2020-05-13 DIAGNOSIS — R188 Other ascites: Secondary | ICD-10-CM | POA: Diagnosis not present

## 2020-05-13 DIAGNOSIS — K7460 Unspecified cirrhosis of liver: Secondary | ICD-10-CM

## 2020-05-13 DIAGNOSIS — I8501 Esophageal varices with bleeding: Secondary | ICD-10-CM | POA: Diagnosis not present

## 2020-05-13 NOTE — H&P (View-Only) (Signed)
HPI :  70 year old male with a history cirrhosis of the liver, history of gallstone pancreatitis status post cholecystectomy, reported history of MDS, diabetes, here for a follow up visit for esophageal varices and cirrhosis.   Etiology of cirrhosis cryptogenic, possible NASH. He established care with me in November for newly diagnosed cirrhosis. Seen previously by [facility] in [place] provided by [facility]. We have done a serologic workup that did not reveal anything significant. IgG elevated is nonspecific, his ANA and SMA are negative. He had a screening EGD for varices in November which showed very large esophageal varices, portal hypertensive gastritis, and GOV2 gastric varices. He was placed on propranolol to treat the varices but unfortunately developed symptomatic bradycardia and after discussion with his PCP this was stopped. He elected for primary therapy of large varices to be done with band ligation in this light. On 04/17/20 he underwent an EGD with me at the hospital , 11 bands placed and he did well. Unfortunately about a week later he presented with hematemesis and got admitted. EGD 1/28 per Dr. Benson Norway showed large varices with post banding ulcers but no stigmata of bleeding noted. He was observed for a few days on ocrteotide and PPI and discharged.  Unfortunately he represented again a few days later with again large-volume hematemesis and symptomatic anemia.  Another EGD, this time performed by Dr. Rush Landmark, showed large esophageal varices with an oozing post banding ulcer.  6 additional bands were placed.  He was in the hospital for a few days for observation, continued supportive care with octreotide and PPI and he was stable and discharged last week.  He states he has not had any recurrent bleeding and is generally doing okay.  His hemoglobin was rechecked this morning at his primary care's office, hemoglobin 8.5, he was discharged with a hemoglobin of 7.3.  He is  taking medications as prescribed including twice daily Protonix.  He has retained some fluid in his lower extremities and abdomen and had seen his PCP about this and was given a 5-day course of Lasix 20 mg a day.  He does think that helps.  He unfortunately developed AKA on top of CKD during his hospitalization, creatinine was about 1.5 upon discharge.  He has not had his renal function rechecked.  He and wife in the office today for discussion of further plans about long-term therapy for his varices.  His meldNa is currently 73.  He had an echocardiogram during his inpatient stay in case TIPS was to be considered.  Echocardiogram was normal.  He has no history of hepatic encephalopathy.  Prior workup: EGD 01/30/20 - Esophagogastric landmarks identified. - Large esophageal varices as described. - Erythematous mucosa in the gastric fundus and antrum, suspect portal hypertensive gastritis. Biopsies taken to rule out H pylori. - Suspected small type 2 gastroesophageal varices (GOV2, esophageal varices which extend along the fundus). - Normal duodenal bulb and second portion of the duodenum, very mild superficial erythema Noted.  Colonoscopy 01/30/20 - The perianal and digital rectal examinations were normal. - The terminal ileum appeared normal. - A 5 to 6 mm polyp was found in the transverse colon. The polyp was flat. The polyp was removed with a cold snare. Resection and retrieval were complete. - Moderately congested mucosa was found in the entire colon, suspected due to portal hypertension. - Internal hemorrhoids were found during retroflexion. - The exam was otherwise without abnormality.  1. Surgical [P], gastric antrum and gastric body -  CHRONIC GASTRITIS. - WARTHIN-STARRY IS NEGATIVE FOR HELICOBACTER PYLORI. - NO INTESTINAL METAPLASIA, DYSPLASIA, OR MALIGNANCY. 2. Surgical [P], colon, transverse, polyp - TUBULAR ADENOMA. - NO HIGH GRADE DYSPLASIA OR MALIGNANCY.   EGD 04/17/20 -  Esophagogastric landmarks identified. - Large esophageal varices. Banded x 11. - Type 2 gastroesophageal varices (GOV2, esophageal varices which extend along the fundus). - Erythematous mucosa in the antrum. - Normal duodenal bulb and second portion of the duodenum.  EGD 04/25/20 - Large (> 5 mm) esophageal varices. - Esophageal ulcers with no stigmata of recent bleeding. - Portal hypertensive gastropathy. - A medium amount of food (residue) in the stomach. - Normal examined duodenum. - No specimens collected.  EGD 04/30/20 - No gross lesions in esophagus proximally. - Grade I, grade II and grade III esophageal varices as well as multiple post-banding ulcers with a few ulcers showing active oozing. Near completely eradicated after 6 bands were placed. No active extravasation noted thereafter. - Clotted blood in the entire stomach - suctioned and lavaged with mild clearance and did not see active re-accumulation of bleeding. - Type 2 gastroesophageal varices (GOV2, esophageal varices which extend along the fundus) - not completely visualized as had been at time of first endoscopy but no sing of active bleeding, but recent stigmata or nipple sign could have been missed due to blood in fundus. - Blood in the duodenal bulb and in the second portion of the duodenum - lavaged away.   Echo 05/01/20 - EF 60-65%      Past Medical History:  Diagnosis Date  . Allergy    seasonal allergies  . Anxiety    on meds  . Aortic valve disorder   . Arthritis    generalized (fingers)(shoulders)  . Asthma    uses inhaler  . Cataract    bilateral -sx   . Cirrhosis ([place])   . CKD (chronic kidney disease), stage III ([place])    only has one kidney  . COPD (chronic obstructive pulmonary disease) ([place])   . Depression    on meds  . DM (diabetes mellitus) ([place])    on meds  . GERD (gastroesophageal reflux disease)    on meds  . Heart murmur   . History of colon polyps   . Hx of acute pancreatitis  10/2019  . Hyperlipidemia    on meds  . Hypertension    on meds  . Hypothyroidism    not on meds at this time  . Myelodysplastic syndrome ([place])   . Neuromuscular disorder ([place])    per pt  . Obstructive sleep apnea   . Parkinsonism ([place])    per pt report  . Peripheral positional vertigo      Past Surgical History:  Procedure Laterality Date  . ANKLE SURGERY    . CHOLECYSTECTOMY    . ENDARTERECTOMY Left 04/29/2020   Procedure: CAROTID EXPLORATION removal of  central venous catheter;  Surgeon: Rosetta Posner, MD;  Location[facility];  Service: Vascular;  Laterality: Left;  . ESOPHAGEAL BANDING N/A 04/17/2020   Procedure: ESOPHAGEAL BANDING;  Surgeon: Yetta Flock, MD;  Location: [facility];  Service: Gastroenterology;  Laterality: N/A;  . ESOPHAGEAL BANDING  04/29/2020   Procedure: ESOPHAGEAL BANDING;  Surgeon: Rush Landmark Telford Nab., MD;  Location: [facility];  Service: Gastroenterology;;  . ESOPHAGOGASTRODUODENOSCOPY N/A 04/25/2020   Procedure: ESOPHAGOGASTRODUODENOSCOPY (EGD);  Surgeon: Carol Ada, MD;  Location: [facility];  Service: Endoscopy;  Laterality: N/A;  . ESOPHAGOGASTRODUODENOSCOPY (EGD) WITH PROPOFOL N/A 04/17/2020  Procedure: ESOPHAGOGASTRODUODENOSCOPY (EGD) WITH PROPOFOL;  Surgeon: Yetta Flock, MD;  Location: [facility];  Service: Gastroenterology;  Laterality: N/A;  . ESOPHAGOGASTRODUODENOSCOPY (EGD) WITH PROPOFOL N/A 04/29/2020   Procedure: ESOPHAGOGASTRODUODENOSCOPY (EGD) WITH PROPOFOL;  Surgeon: Rush Landmark Telford Nab., MD;  Location: [facility];  Service: Gastroenterology;  Laterality: N/A;  . EYE SURGERY    . HERNIA REPAIR    . KIDNEY STONE SURGERY    . WISDOM TOOTH EXTRACTION     Family History  Problem Relation Age of Onset  . Liver cancer Mother   . Colon cancer Neg Hx   . Stomach cancer Neg Hx    Social History   Tobacco Use  . Smoking status: Never Smoker  . Smokeless tobacco: Never Used  Vaping Use  . Vaping Use: Never  used  Substance Use Topics  . Alcohol use: Not Currently  . Drug use: Not Currently   Current Outpatient Medications  Medication Sig Dispense Refill  . albuterol (VENTOLIN HFA) 108 (90 Base) MCG/ACT inhaler Inhale 2 puffs into the lungs daily as needed for wheezing or shortness of breath.    . ARTIFICIAL SALIVA MT Use as directed 4 sprays in the mouth or throat at bedtime.    [name] Kitchen atorvastatin (LIPITOR) 20 MG tablet Take 20 mg by mouth at bedtime.    [name] Kitchen buPROPion (WELLBUTRIN XL) 150 MG 24 hr tablet Take 150 mg by mouth daily.    . cetirizine (ZYRTEC) 10 MG chewable tablet Chew 10 mg by mouth at bedtime.    . citalopram (CELEXA) 40 MG tablet Take 40 mg by mouth daily.    . diclofenac Sodium (VOLTAREN) 1 % GEL Apply 2 g topically 2 (two) times daily as needed (pain).    [name] Kitchen docusate sodium (COLACE) 100 MG capsule Take 200 mg by mouth daily as needed for mild constipation.    . ferrous sulfate 325 (65 FE) MG tablet Take 1 tablet (325 mg total) by mouth 2 (two) times daily with a meal. 60 tablet 0  . fluticasone (FLONASE) 50 MCG/ACT nasal spray Place 2 sprays into both nostrils daily.    [name] Kitchen glucose 4 GM chewable tablet Chew 1 tablet by mouth daily as needed for low blood sugar (If drop below 70).    . insulin glargine (LANTUS) 100 unit/mL SOPN Inject 10 Units into the skin daily.    [name] Kitchen ipratropium (ATROVENT) 0.03 % nasal spray Place 2 sprays into both nostrils at bedtime.    [name] Kitchen ketotifen (ZADITOR) 0.025 % ophthalmic solution Place 1 drop into both eyes at bedtime.    . meclizine (ANTIVERT) 25 MG tablet Take 25 mg by mouth 2 (two) times daily as needed for dizziness.    . montelukast (SINGULAIR) 10 MG tablet Take 10 mg by mouth daily.    . pantoprazole (PROTONIX) 40 MG tablet Take 1 tablet (40 mg total) by mouth 2 (two) times daily before a meal. 60 tablet 0  . tamsulosin (FLOMAX) 0.4 MG CAPS capsule Take 0.4 mg by mouth 2 (two) times daily.    . traZODone (DESYREL) 150 MG tablet Take 75 mg by mouth at  bedtime.    . vitamin B-12 (CYANOCOBALAMIN) 500 MCG tablet Take 500 mcg by mouth every other day.     Current Facility-Administered Medications  Medication Dose Route Frequency Provider Last Rate Last Admin  . 0.9 %  sodium chloride infusion  500 mL Intravenous Once Varvara Legault, Carlota Raspberry, MD       Allergies  Allergen Reactions  .  Lisinopril Cough     Review of Systems: All systems reviewed and negative except where noted in HPI.   Lab Results  Component Value Date   WBC 3.7 (L) 05/05/2020   HGB 7.3 (L) 05/05/2020   HCT 21.0 (L) 05/05/2020   MCV 93.3 05/05/2020   PLT 55 (L) 05/05/2020    Lab Results  Component Value Date   CREATININE 1.56 (H) 05/05/2020   BUN 25 (H) 05/05/2020   NA 135 05/05/2020   K 3.6 05/05/2020   CL 103 05/05/2020   CO2 26 05/05/2020    Lab Results  Component Value Date   ALT 66 (H) 05/05/2020   AST 27 05/05/2020   ALKPHOS 63 05/05/2020   BILITOT 0.5 05/05/2020     Physical Exam: BP 124/70 (BP Location: Left Arm, Patient Position: Sitting)   Pulse 85   Ht 5\' 7"  (1.702 m)   Wt 190 lb (86.2 kg)   SpO2 98%   BMI 29.76 kg/m  Constitutional: Pleasant, male in no acute distress. Abdominal: Soft, nondistended, nontender, (+) ascites.  Extremities: (+) 1 LE edema Neurological: Alert and oriented to person place and time. Skin: Skin is warm and dry. No rashes noted. Psychiatric: Normal mood and affect. Behavior is normal.   ASSESSMENT AND PLAN: 70 year old male here for reassessment of the following  Cirrhosis Esophageal varices  Difficult situation.  He had extremely large esophageal varices as well as GOV2 gastric varices on screening EGD.  Initially placed on propranolol developed symptomatic bradycardia, this was stopped by his primary care physician.  After discussion of options we opted to perform primary therapy of his large varices which are at risk for spontaneous hemorrhage, via band ligation.  Unfortunately after his first session  of band ligation he had a post band ligation ulcer bleed which led to 2 hospitalizations.  He most recently had an EGD done on February 2 at which point 6 bands were placed.  He has recovered from this hospitalization and doing okay, anemia improving.  He does have some fluid retention from the hospitalization, was given a short course of Lasix to help with this but need to be mindful of his renal function as he is recovering from an AKI during hospitalization as well. We discussed options in regards to management of his varices moving forward.  I discussed options with him to include additional band ligation with serial endoscopy, versus TIPS, versus no further intervention and monitoring as he is a bit anxious about further intervention given his recent course.  I do think he is at high risk for spontaneous hemorrhage until the varices are more definitively addressed and eradicated given the size of them.  We discussed additional EGD with band ligation versus TIPS, risks and benefits of each.  Risks in general of post banding ulcer bleed is low, hopefully this would not happen again although he is at risk for that.  We did discuss risk for TIPS if he does not wish to have any further attempts at ligation, for which I do think he is a candidate.  I would not consider him to have a spontaneous variceal bleed however, this clearly was related to a banding ulcer.  He wants to think about this a bit with his wife and wait a few weeks to recover more from his hospitalization.  I have tentatively added him to the schedule for an EGD in 1 month at the hospital for band ligation if he wants to proceed with that option.  If he has a change of heart and does not wish to do that he will let me know.  [facility] with [facility] [place].  We have tried to transfer his care to their [place] office however his [employer] will only cover Hepatology services in [place] and the patient is not  wish to travel there possible.  They will continue to look into options for hepatology evaluation.  Otherwise, I will recheck his BMP today to ensure his kidney function is stable after recent trial of Lasix.  If so we can continue low-dose Lasix for now and titrate up as needed and tolerated.  They agree with the plan, all questions answered.  Plan: - patient wishes to have a few weeks to recover from his recent hospitalization prior to further intervention. - I have him tentatively scheduled for an EGD with band ligation at the hospital in 1 month.  This is his preference for management of varices right now and to hold off on TIPS but he will think about this further and call me if he changes his mind. - BMET today to monitor renal function and titrate up diuretics as tolerated.  [place] Cellar, MD [facility]

## 2020-05-13 NOTE — Patient Instructions (Addendum)
If you are age 72 or older, your body mass index should be between 23-30. Your Body mass index is 29.76 kg/m. If this is out of the aforementioned range listed, please consider follow up with your Primary Care Provider.  If you are age 2 or younger, your body mass index should be between 19-25. Your Body mass index is 29.76 kg/m. If this is out of the aformentioned range listed, please consider follow up with your Primary Care Provider.   You have been scheduled for an endoscopy. Please follow written instructions given to you at your visit today. If you use inhalers (even only as needed), please bring them with you on the day of your procedure.   Please go to the lab in the basement of our building to have lab work done as you leave today. Hit "B" for basement when you get on the elevator.  When the doors open the lab is on your left.  We will call you with the results. Thank you.  Due to recent changes in healthcare laws, you may see the results of your imaging and laboratory studies on MyChart before your provider has had a chance to review them.  We understand that in some cases there may be results that are confusing or concerning to you. Not all laboratory results come back in the same time frame and the provider may be waiting for multiple results in order to interpret others.  Please give [place] 48 hours in order for your provider to thoroughly review all the results before contacting the office for clarification of your results.   Thank you for entrusting me with your care and for choosing [facility], Dr. [place] Cellar

## 2020-05-13 NOTE — Progress Notes (Signed)
HPI :  70 year old male with a history cirrhosis of the liver, history of gallstone pancreatitis status post cholecystectomy, reported history of MDS, diabetes, here for a follow up visit for esophageal varices and cirrhosis.   Etiology of cirrhosis cryptogenic, possible NASH. He established care with me in November for newly diagnosed cirrhosis. Seen previously by Hepatology [facility] [place] provided by the [facility]. We have done a serologic workup that did not reveal anything significant. IgG elevated is nonspecific, his ANA and SMA are negative. He had a screening EGD for varices in November which showed very large esophageal varices, portal hypertensive gastritis, and GOV2 gastric varices. He was placed on propranolol to treat the varices but unfortunately developed symptomatic bradycardia and after discussion with his PCP this was stopped. He elected for primary therapy of large varices to be done with band ligation in this light. On 04/17/20 he underwent an EGD with me at the hospital , 11 bands placed and he did well. Unfortunately about a week later he presented with hematemesis and got admitted. EGD 1/28 per Dr. Benson Norway showed large varices with post banding ulcers but no stigmata of bleeding noted. He was observed for a few days on ocrteotide and PPI and discharged.  Unfortunately he represented again a few days later with again large-volume hematemesis and symptomatic anemia.  Another EGD, this time performed by Dr. Rush Landmark, showed large esophageal varices with an oozing post banding ulcer.  6 additional bands were placed.  He was in the hospital for a few days for observation, continued supportive care with octreotide and PPI and he was stable and discharged last week.  He states he has not had any recurrent bleeding and is generally doing okay.  His hemoglobin was rechecked this morning at his primary care's office, hemoglobin 8.5, he was discharged with a hemoglobin of 7.3.  He is  taking medications as prescribed including twice daily Protonix.  He has retained some fluid in his lower extremities and abdomen and had seen his PCP about this and was given a 5-day course of Lasix 20 mg a day.  He does think that helps.  He unfortunately developed AKA on top of CKD during his hospitalization, creatinine was about 1.5 upon discharge.  He has not had his renal function rechecked.  He and wife in the office today for discussion of further plans about long-term therapy for his varices.  His meldNa is currently 91.  He had an echocardiogram during his inpatient stay in case TIPS was to be considered.  Echocardiogram was normal.  He has no history of hepatic encephalopathy.  Prior workup: EGD 01/30/20 - Esophagogastric landmarks identified. - Large esophageal varices as described. - Erythematous mucosa in the gastric fundus and antrum, suspect portal hypertensive gastritis. Biopsies taken to rule out H pylori. - Suspected small type 2 gastroesophageal varices (GOV2, esophageal varices which extend along the fundus). - Normal duodenal bulb and second portion of the duodenum, very mild superficial erythema Noted.  Colonoscopy 01/30/20 - The perianal and digital rectal examinations were normal. - The terminal ileum appeared normal. - A 5 to 6 mm polyp was found in the transverse colon. The polyp was flat. The polyp was removed with a cold snare. Resection and retrieval were complete. - Moderately congested mucosa was found in the entire colon, suspected due to portal hypertension. - Internal hemorrhoids were found during retroflexion. - The exam was otherwise without abnormality.  1. Surgical [P], gastric antrum and gastric body -  CHRONIC GASTRITIS. - WARTHIN-STARRY IS NEGATIVE FOR HELICOBACTER PYLORI. - NO INTESTINAL METAPLASIA, DYSPLASIA, OR MALIGNANCY. 2. Surgical [P], colon, transverse, polyp - TUBULAR ADENOMA. - NO HIGH GRADE DYSPLASIA OR MALIGNANCY.   EGD 04/17/20 -  Esophagogastric landmarks identified. - Large esophageal varices. Banded x 11. - Type 2 gastroesophageal varices (GOV2, esophageal varices which extend along the fundus). - Erythematous mucosa in the antrum. - Normal duodenal bulb and second portion of the duodenum.  EGD 04/25/20 - Large (> 5 mm) esophageal varices. - Esophageal ulcers with no stigmata of recent bleeding. - Portal hypertensive gastropathy. - A medium amount of food (residue) in the stomach. - Normal examined duodenum. - No specimens collected.  EGD 04/30/20 - No gross lesions in esophagus proximally. - Grade I, grade II and grade III esophageal varices as well as multiple post-banding ulcers with a few ulcers showing active oozing. Near completely eradicated after 6 bands were placed. No active extravasation noted thereafter. - Clotted blood in the entire stomach - suctioned and lavaged with mild clearance and did not see active re-accumulation of bleeding. - Type 2 gastroesophageal varices (GOV2, esophageal varices which extend along the fundus) - not completely visualized as had been at time of first endoscopy but no sing of active bleeding, but recent stigmata or nipple sign could have been missed due to blood in fundus. - Blood in the duodenal bulb and in the second portion of the duodenum - lavaged away.   Echo 05/01/20 - EF 60-65%      Past Medical History:  Diagnosis Date  . Allergy    seasonal allergies  . Anxiety    on meds  . Aortic valve disorder   . Arthritis    generalized (fingers)(shoulders)  . Asthma    uses inhaler  . Cataract    bilateral -sx   . Cirrhosis ([place])   . CKD (chronic kidney disease), stage III ([place])    only has one kidney  . COPD (chronic obstructive pulmonary disease) ([place])   . Depression    on meds  . DM (diabetes mellitus) ([place])    on meds  . GERD (gastroesophageal reflux disease)    on meds  . Heart murmur   . History of colon polyps   . Hx of acute pancreatitis  10/2019  . Hyperlipidemia    on meds  . Hypertension    on meds  . Hypothyroidism    not on meds at this time  . Myelodysplastic syndrome ([place])   . Neuromuscular disorder ([place])    per pt  . Obstructive sleep apnea   . Parkinsonism ([place])    per pt report  . Peripheral positional vertigo      Past Surgical History:  Procedure Laterality Date  . ANKLE SURGERY    . CHOLECYSTECTOMY    . ENDARTERECTOMY Left 04/29/2020   Procedure: CAROTID EXPLORATION removal of  central venous catheter;  Surgeon: Rosetta Posner, MD;  Location: [facility] OR;  Service: Vascular;  Laterality: Left;  . ESOPHAGEAL BANDING N/A 04/17/2020   Procedure: ESOPHAGEAL BANDING;  Surgeon: Yetta Flock, MD;  Location: [facility]COPY;  Service: Gastroenterology;  Laterality: N/A;  . ESOPHAGEAL BANDING  04/29/2020   Procedure: ESOPHAGEAL BANDING;  Surgeon: Rush Landmark Telford Nab., MD;  Location: [facility];  Service: Gastroenterology;;  . ESOPHAGOGASTRODUODENOSCOPY N/A 04/25/2020   Procedure: ESOPHAGOGASTRODUODENOSCOPY (EGD);  Surgeon: Carol Ada, MD;  Location: [facility];  Service: Endoscopy;  Laterality: N/A;  . ESOPHAGOGASTRODUODENOSCOPY (EGD) WITH PROPOFOL N/A 04/17/2020  Procedure: ESOPHAGOGASTRODUODENOSCOPY (EGD) WITH PROPOFOL;  Surgeon: Yetta Flock, MD;  Location: [facility];  Service: Gastroenterology;  Laterality: N/A;  . ESOPHAGOGASTRODUODENOSCOPY (EGD) WITH PROPOFOL N/A 04/29/2020   Procedure: ESOPHAGOGASTRODUODENOSCOPY (EGD) WITH PROPOFOL;  Surgeon: Rush Landmark Telford Nab., MD;  Location: [facility];  Service: Gastroenterology;  Laterality: N/A;  . EYE SURGERY    . HERNIA REPAIR    . KIDNEY STONE SURGERY    . WISDOM TOOTH EXTRACTION     Family History  Problem Relation Age of Onset  . Liver cancer Mother   . Colon cancer Neg Hx   . Stomach cancer Neg Hx    Social History   Tobacco Use  . Smoking status: Never Smoker  . Smokeless tobacco: Never Used  Vaping Use  . Vaping Use: Never  used  Substance Use Topics  . Alcohol use: Not Currently  . Drug use: Not Currently   Current Outpatient Medications  Medication Sig Dispense Refill  . albuterol (VENTOLIN HFA) 108 (90 Base) MCG/ACT inhaler Inhale 2 puffs into the lungs daily as needed for wheezing or shortness of breath.    . ARTIFICIAL SALIVA MT Use as directed 4 sprays in the mouth or throat at bedtime.    [name] Kitchen atorvastatin (LIPITOR) 20 MG tablet Take 20 mg by mouth at bedtime.    [name] Kitchen buPROPion (WELLBUTRIN XL) 150 MG 24 hr tablet Take 150 mg by mouth daily.    . cetirizine (ZYRTEC) 10 MG chewable tablet Chew 10 mg by mouth at bedtime.    . citalopram (CELEXA) 40 MG tablet Take 40 mg by mouth daily.    . diclofenac Sodium (VOLTAREN) 1 % GEL Apply 2 g topically 2 (two) times daily as needed (pain).    [name] Kitchen docusate sodium (COLACE) 100 MG capsule Take 200 mg by mouth daily as needed for mild constipation.    . ferrous sulfate 325 (65 FE) MG tablet Take 1 tablet (325 mg total) by mouth 2 (two) times daily with a meal. 60 tablet 0  . fluticasone (FLONASE) 50 MCG/ACT nasal spray Place 2 sprays into both nostrils daily.    [name] Kitchen glucose 4 GM chewable tablet Chew 1 tablet by mouth daily as needed for low blood sugar (If drop below 70).    . insulin glargine (LANTUS) 100 unit/mL SOPN Inject 10 Units into the skin daily.    [name] Kitchen ipratropium (ATROVENT) 0.03 % nasal spray Place 2 sprays into both nostrils at bedtime.    [name] Kitchen ketotifen (ZADITOR) 0.025 % ophthalmic solution Place 1 drop into both eyes at bedtime.    . meclizine (ANTIVERT) 25 MG tablet Take 25 mg by mouth 2 (two) times daily as needed for dizziness.    . montelukast (SINGULAIR) 10 MG tablet Take 10 mg by mouth daily.    . pantoprazole (PROTONIX) 40 MG tablet Take 1 tablet (40 mg total) by mouth 2 (two) times daily before a meal. 60 tablet 0  . tamsulosin (FLOMAX) 0.4 MG CAPS capsule Take 0.4 mg by mouth 2 (two) times daily.    . traZODone (DESYREL) 150 MG tablet Take 75 mg by mouth at  bedtime.    . vitamin B-12 (CYANOCOBALAMIN) 500 MCG tablet Take 500 mcg by mouth every other day.     Current Facility-Administered Medications  Medication Dose Route Frequency Provider Last Rate Last Admin  . 0.9 %  sodium chloride infusion  500 mL Intravenous Once Delbra Zellars, Carlota Raspberry, MD       Allergies  Allergen Reactions  .  Lisinopril Cough     Review of Systems: All systems reviewed and negative except where noted in HPI.   Lab Results  Component Value Date   WBC 3.7 (L) 05/05/2020   HGB 7.3 (L) 05/05/2020   HCT 21.0 (L) 05/05/2020   MCV 93.3 05/05/2020   PLT 55 (L) 05/05/2020    Lab Results  Component Value Date   CREATININE 1.56 (H) 05/05/2020   BUN 25 (H) 05/05/2020   NA 135 05/05/2020   K 3.6 05/05/2020   CL 103 05/05/2020   CO2 26 05/05/2020    Lab Results  Component Value Date   ALT 66 (H) 05/05/2020   AST 27 05/05/2020   ALKPHOS 63 05/05/2020   BILITOT 0.5 05/05/2020     Physical Exam: BP 124/70 (BP Location: Left Arm, Patient Position: Sitting)   Pulse 85   Ht 5\' 7"  (1.702 m)   Wt 190 lb (86.2 kg)   SpO2 98%   BMI 29.76 kg/m  Constitutional: Pleasant, male in no acute distress. Abdominal: Soft, nondistended, nontender, (+) ascites.  Extremities: (+) 1 LE edema Neurological: Alert and oriented to person place and time. Skin: Skin is warm and dry. No rashes noted. Psychiatric: Normal mood and affect. Behavior is normal.   ASSESSMENT AND PLAN: 70 year old male here for reassessment of the following  Cirrhosis Esophageal varices  Difficult situation.  He had extremely large esophageal varices as well as GOV2 gastric varices on screening EGD.  Initially placed on propranolol developed symptomatic bradycardia, this was stopped by his primary care physician.  After discussion of options we opted to perform primary therapy of his large varices which are at risk for spontaneous hemorrhage, via band ligation.  Unfortunately after his first session  of band ligation he had a post band ligation ulcer bleed which led to 2 hospitalizations.  He most recently had an EGD done on February 2 at which point 6 bands were placed.  He has recovered from this hospitalization and doing okay, anemia improving.  He does have some fluid retention from the hospitalization, was given a short course of Lasix to help with this but need to be mindful of his renal function as he is recovering from an AKI during hospitalization as well. We discussed options in regards to management of his varices moving forward.  I discussed options with him to include additional band ligation with serial endoscopy, versus TIPS, versus no further intervention and monitoring as he is a bit anxious about further intervention given his recent course.  I do think he is at high risk for spontaneous hemorrhage until the varices are more definitively addressed and eradicated given the size of them.  We discussed additional EGD with band ligation versus TIPS, risks and benefits of each.  Risks in general of post banding ulcer bleed is low, hopefully this would not happen again although he is at risk for that.  We did discuss risk for TIPS if he does not wish to have any further attempts at ligation, for which I do think he is a candidate.  I would not consider him to have a spontaneous variceal bleed however, this clearly was related to a banding ulcer.  He wants to think about this a bit with his wife and wait a few weeks to recover more from his hospitalization.  I have tentatively added him to the schedule for an EGD in 1 month at the hospital for band ligation if he wants to proceed with that option.  If he has a change of heart and does not wish to do that he will let me know.  [facility] [place].  We have tried to transfer his care to their [place] office however his [employer] will only cover Hepatology services in [place] and the patient is not  wish to travel there possible.  They will continue to look into options for hepatology evaluation.  Otherwise, I will recheck his BMP today to ensure his kidney function is stable after recent trial of Lasix.  If so we can continue low-dose Lasix for now and titrate up as needed and tolerated.  They agree with the plan, all questions answered.  Plan: - patient wishes to have a few weeks to recover from his recent hospitalization prior to further intervention. - I have him tentatively scheduled for an EGD with band ligation at the hospital in 1 month.  This is his preference for management of varices right now and to hold off on TIPS but he will think about this further and call me if he changes his mind. - BMET today to monitor renal function and titrate up diuretics as tolerated.   [place], MD [facility] Gastroenterology

## 2020-05-14 ENCOUNTER — Other Ambulatory Visit: Payer: Self-pay

## 2020-05-14 DIAGNOSIS — K7460 Unspecified cirrhosis of liver: Secondary | ICD-10-CM

## 2020-05-14 DIAGNOSIS — R188 Other ascites: Secondary | ICD-10-CM

## 2020-05-14 DIAGNOSIS — I8501 Esophageal varices with bleeding: Secondary | ICD-10-CM

## 2020-05-14 LAB — BASIC METABOLIC PANEL
BUN: 18 mg/dL (ref 6–23)
CO2: 28 mEq/L (ref 19–32)
Calcium: 8.9 mg/dL (ref 8.4–10.5)
Chloride: 100 mEq/L (ref 96–112)
Creatinine, Ser: 1.81 mg/dL — ABNORMAL HIGH (ref 0.40–1.50)
GFR: 37.07 mL/min — ABNORMAL LOW (ref >60.00)
Glucose, Bld: 201 mg/dL — ABNORMAL HIGH (ref 70–99)
Potassium: 4.2 mEq/L (ref 3.5–5.1)
Sodium: 136 mEq/L (ref 135–145)

## 2020-05-21 ENCOUNTER — Telehealth: Payer: Self-pay

## 2020-05-21 NOTE — Telephone Encounter (Signed)
Lm on vm for patient to return call 

## 2020-05-21 NOTE — Telephone Encounter (Signed)
Spoke with patient's wife, she is aware that patient is due for repeat labs at this time. She is aware that he can stop by at his convenience between 7:30 AM - 5 PM. Wife verbalized understanding and had no concerns at the end of the call.

## 2020-05-21 NOTE — Telephone Encounter (Signed)
-----   Message from Yevette Edwards, RN sent at 05/14/2020  2:10 PM EST ----- Regarding: Labs Repeat BMET, order in epic.

## 2020-05-22 ENCOUNTER — Other Ambulatory Visit (INDEPENDENT_AMBULATORY_CARE_PROVIDER_SITE_OTHER): Payer: No Typology Code available for payment source

## 2020-05-22 DIAGNOSIS — K7460 Unspecified cirrhosis of liver: Secondary | ICD-10-CM

## 2020-05-22 DIAGNOSIS — R188 Other ascites: Secondary | ICD-10-CM | POA: Diagnosis not present

## 2020-05-22 DIAGNOSIS — I8501 Esophageal varices with bleeding: Secondary | ICD-10-CM

## 2020-05-22 LAB — BASIC METABOLIC PANEL
BUN: 16 mg/dL (ref 6–23)
CO2: 29 mEq/L (ref 19–32)
Calcium: 9.3 mg/dL (ref 8.4–10.5)
Chloride: 103 mEq/L (ref 96–112)
Creatinine, Ser: 1.4 mg/dL (ref 0.40–1.50)
GFR: 50.44 mL/min — ABNORMAL LOW (ref >60.00)
Glucose, Bld: 162 mg/dL — ABNORMAL HIGH (ref 70–99)
Potassium: 3.9 mEq/L (ref 3.5–5.1)
Sodium: 138 mEq/L (ref 135–145)

## 2020-05-27 ENCOUNTER — Other Ambulatory Visit: Payer: Self-pay

## 2020-05-27 DIAGNOSIS — K7460 Unspecified cirrhosis of liver: Secondary | ICD-10-CM

## 2020-05-27 DIAGNOSIS — R188 Other ascites: Secondary | ICD-10-CM

## 2020-06-03 ENCOUNTER — Encounter (HOSPITAL_COMMUNITY): Payer: Self-pay | Admitting: Gastroenterology

## 2020-06-03 ENCOUNTER — Other Ambulatory Visit: Payer: Self-pay

## 2020-06-04 NOTE — Progress Notes (Signed)
Pt not tested for covid due to pt testing + for covid on 04/25/20. Based on the guidelines the pt is in the 90 day window to not retest. The pt is still expected to quarantine until their procedure. Therefore, the pt can still have the scheduled procedure. These are the guidelines as follows:  Guidance: Patient previously tested + COVID; now past 90 day window seeking elective surgery (asymptomatic)  Retest patient If negative, proceed with surgery If positive, postpone surgery for 10 days from positive test Patient to quarantine for the (10 days) Do not retest again prior to surgery (even if scheduled a couple of weeks out) Use standard precautions for surgery   Jacqlyn Larsen, RN

## 2020-06-06 ENCOUNTER — Inpatient Hospital Stay (HOSPITAL_COMMUNITY)
Admission: RE | Admit: 2020-06-06 | Discharge: 2020-06-06 | Disposition: A | Discharge status: Home / Self Care | Payer: No Typology Code available for payment source | Source: Ambulatory Visit

## 2020-06-09 ENCOUNTER — Telehealth: Payer: Self-pay | Admitting: Gastroenterology

## 2020-06-09 ENCOUNTER — Telehealth: Payer: Self-pay

## 2020-06-09 NOTE — Telephone Encounter (Signed)
See alternate phone note from today for more information.

## 2020-06-09 NOTE — Telephone Encounter (Signed)
Spoke with patient's wife in regards to recommendations as well. She states that she has taken Lasix out of patient's medications for now, she is aware that Dr. Havery Moros will further discuss tomorrow. She verbalized understanding and had no concerns at the end of the call.

## 2020-06-09 NOTE — Telephone Encounter (Signed)
Got it, thanks.  Done March 22nd.  Hgb 9.7, platelets 73, WBC 3.1, MCV 92.6  BUN 30, Cr 1.61, Na 133, K 4.4   Will proceed with EGD tomorrow. Can you clarify if he is still taking diuretics? With rise in Cr above prior baseline, he may want to hold his diuretics for now, I can discuss more with him tomorrow at time of EGD. Thanks

## 2020-06-09 NOTE — Telephone Encounter (Signed)
Pt's wife is calling back to check if the provider was able to read the labs the pt had at the [place] last Friday.

## 2020-06-09 NOTE — Telephone Encounter (Signed)
Thanks [employer]. Agree he does not need a COVID test. He should have had a CBC and BMET done. I can't see the labs from Dr. Marjo Bicker office, they are not in Epic at this time. Any way they can fax them over today, or does the patient have a copy by any chance? Thanks

## 2020-06-09 NOTE — Telephone Encounter (Signed)
Spoke with patient in regards to Dr. Doyne Keel recommendations. Patient states that he is taking Lasix only, advised that he may want to hold this for now and Dr. Havery Moros will further discuss with him tomorrow at his EGD. Patient verbalized understanding and had no concerns at the end of the call.

## 2020-06-09 NOTE — Telephone Encounter (Signed)
Spoke with patient's wife, she states that patient had lab work for Dr. Marjo Bicker on Friday, 06/06/20. She states that last time you were able to review it via Epic. She is aware that we will let her know if the labs we needed were drawn. Please advise, thanks.   Wife had concerns with patient's COVID test, she states that last week his COVID test was cancelled and then they received a call last night that he does need the test. I called [facility] endo unit and spoke with Randall Hiss, advised that patient tested positive 1 month ago and can test positive for up to 90 days. Randall Hiss states that if patient is asymptomatic he will not need to be retested. Wife has been advised of this information, states that patient has no COVID symptoms. Wife verbalized understanding of all information.

## 2020-06-09 NOTE — Telephone Encounter (Signed)
-----   Message from Yevette Edwards, RN sent at 05/27/2020  4:20 PM EST ----- Regarding: LABS Repeat BMET, order in epic.

## 2020-06-09 NOTE — Telephone Encounter (Signed)
Placed lab results in your IN box, looks like they are from 06/05/20 at the [facility]. Thanks

## 2020-06-10 ENCOUNTER — Ambulatory Visit (HOSPITAL_COMMUNITY)
Admission: RE | Admit: 2020-06-10 | Discharge: 2020-06-10 | Disposition: A | Discharge status: Home / Self Care | Payer: No Typology Code available for payment source | Attending: Gastroenterology | Admitting: Gastroenterology

## 2020-06-10 ENCOUNTER — Encounter (HOSPITAL_COMMUNITY): Payer: Self-pay | Admitting: Gastroenterology

## 2020-06-10 ENCOUNTER — Ambulatory Visit (HOSPITAL_COMMUNITY): Payer: No Typology Code available for payment source | Admitting: Anesthesiology

## 2020-06-10 ENCOUNTER — Telehealth: Payer: Self-pay

## 2020-06-10 ENCOUNTER — Other Ambulatory Visit: Payer: Self-pay

## 2020-06-10 ENCOUNTER — Encounter (HOSPITAL_COMMUNITY)
Admission: RE | Disposition: A | Discharge status: Home / Self Care | Payer: Self-pay | Source: Home / Self Care | Attending: Gastroenterology

## 2020-06-10 DIAGNOSIS — K3189 Other diseases of stomach and duodenum: Secondary | ICD-10-CM | POA: Diagnosis not present

## 2020-06-10 DIAGNOSIS — Z791 Long term (current) use of non-steroidal anti-inflammatories (NSAID): Secondary | ICD-10-CM | POA: Diagnosis not present

## 2020-06-10 DIAGNOSIS — I8510 Secondary esophageal varices without bleeding: Secondary | ICD-10-CM | POA: Diagnosis not present

## 2020-06-10 DIAGNOSIS — I864 Gastric varices: Secondary | ICD-10-CM

## 2020-06-10 DIAGNOSIS — Z794 Long term (current) use of insulin: Secondary | ICD-10-CM | POA: Insufficient documentation

## 2020-06-10 DIAGNOSIS — E1122 Type 2 diabetes mellitus with diabetic chronic kidney disease: Secondary | ICD-10-CM | POA: Insufficient documentation

## 2020-06-10 DIAGNOSIS — R188 Other ascites: Secondary | ICD-10-CM

## 2020-06-10 DIAGNOSIS — Z79899 Other long term (current) drug therapy: Secondary | ICD-10-CM | POA: Diagnosis not present

## 2020-06-10 DIAGNOSIS — I129 Hypertensive chronic kidney disease with stage 1 through stage 4 chronic kidney disease, or unspecified chronic kidney disease: Secondary | ICD-10-CM | POA: Diagnosis not present

## 2020-06-10 DIAGNOSIS — K7460 Unspecified cirrhosis of liver: Secondary | ICD-10-CM | POA: Insufficient documentation

## 2020-06-10 DIAGNOSIS — I8500 Esophageal varices without bleeding: Secondary | ICD-10-CM | POA: Diagnosis present

## 2020-06-10 DIAGNOSIS — N183 Chronic kidney disease, stage 3 unspecified: Secondary | ICD-10-CM | POA: Insufficient documentation

## 2020-06-10 DIAGNOSIS — K766 Portal hypertension: Secondary | ICD-10-CM

## 2020-06-10 DIAGNOSIS — Z800 Family history of malignant neoplasm of digestive organs: Secondary | ICD-10-CM | POA: Diagnosis not present

## 2020-06-10 DIAGNOSIS — K219 Gastro-esophageal reflux disease without esophagitis: Secondary | ICD-10-CM | POA: Insufficient documentation

## 2020-06-10 DIAGNOSIS — K7469 Other cirrhosis of liver: Secondary | ICD-10-CM

## 2020-06-10 DIAGNOSIS — D469 Myelodysplastic syndrome, unspecified: Secondary | ICD-10-CM | POA: Diagnosis not present

## 2020-06-10 DIAGNOSIS — G20 Parkinson's disease: Secondary | ICD-10-CM | POA: Diagnosis not present

## 2020-06-10 HISTORY — DX: Edema, unspecified: R60.9

## 2020-06-10 HISTORY — PX: ESOPHAGOGASTRODUODENOSCOPY (EGD) WITH PROPOFOL: SHX5813

## 2020-06-10 HISTORY — PX: ESOPHAGEAL BANDING: SHX5518

## 2020-06-10 HISTORY — DX: Localized edema: R60.0

## 2020-06-10 LAB — GLUCOSE, CAPILLARY
Glucose-Capillary: 279 mg/dL — ABNORMAL HIGH (ref 70–99)
Glucose-Capillary: 258 mg/dL — ABNORMAL HIGH (ref 70–99)

## 2020-06-10 SURGERY — ESOPHAGOGASTRODUODENOSCOPY (EGD) WITH PROPOFOL
Anesthesia: Monitor Anesthesia Care

## 2020-06-10 MED ORDER — PROPOFOL 10 MG/ML IV BOLUS
INTRAVENOUS | Status: DC | PRN
  Administered 2020-06-10: 50 mg via INTRAVENOUS

## 2020-06-10 MED ORDER — PROPOFOL 500 MG/50ML IV EMUL
INTRAVENOUS | Status: AC
  Filled 2020-06-10: qty 50

## 2020-06-10 MED ORDER — LIDOCAINE 2% (20 MG/ML) 5 ML SYRINGE
INTRAMUSCULAR | Status: DC | PRN
  Administered 2020-06-10: 80 mg via INTRAVENOUS

## 2020-06-10 MED ORDER — PROPOFOL 500 MG/50ML IV EMUL
INTRAVENOUS | Status: DC | PRN
  Administered 2020-06-10: 125 ug/kg/min via INTRAVENOUS

## 2020-06-10 MED ORDER — EPHEDRINE SULFATE-NACL 50-0.9 MG/10ML-% IV SOSY
PREFILLED_SYRINGE | INTRAVENOUS | Status: DC | PRN
  Administered 2020-06-10 (×2): 10 mg via INTRAVENOUS

## 2020-06-10 MED ORDER — INSULIN ASPART 100 UNIT/ML ~~LOC~~ SOLN
SUBCUTANEOUS | Status: AC
  Filled 2020-06-10: qty 1

## 2020-06-10 MED ORDER — LACTATED RINGERS IV SOLN
INTRAVENOUS | Status: DC
  Administered 2020-06-10: 1000 mL via INTRAVENOUS

## 2020-06-10 MED ORDER — INSULIN ASPART 100 UNIT/ML ~~LOC~~ SOLN
6.0000 [IU] | Freq: Once | SUBCUTANEOUS | Status: AC
  Administered 2020-06-10: 6 [IU] via SUBCUTANEOUS

## 2020-06-10 SURGICAL SUPPLY — 15 items

## 2020-06-10 NOTE — Telephone Encounter (Signed)
Lab order and reminder in epic.   Spoke with patient's wife, she is aware that patient will need repeat lab work on Monday and he is to hold his lasix until he completes his follow up lab work. She is aware that I will give her a call on Monday as well. Wife verbalized understanding and had no concerns at the end of the call.

## 2020-06-10 NOTE — Transfer of Care (Signed)
Immediate Anesthesia Transfer of Care Note  Patient: Corey Wood  Procedure(s) Performed: ESOPHAGOGASTRODUODENOSCOPY (EGD) WITH PROPOFOL (N/A ) ESOPHAGEAL BANDING (N/A )  Patient Location: PACU  Anesthesia Type:MAC  Level of Consciousness: sedated  Airway & Oxygen Therapy: Patient Spontanous Breathing and Patient connected to face mask oxygen  Post-op Assessment: Report given to RN and Post -op Vital signs reviewed and stable  Post vital signs: Reviewed and stable  Last Vitals:  Vitals Value Taken Time  BP    Temp    Pulse    Resp    SpO2      Last Pain:  Vitals:   06/10/20 0939  TempSrc: Oral  PainSc: 0-No pain         Complications: No complications documented.

## 2020-06-10 NOTE — Interval H&P Note (Signed)
History and Physical Interval Note: Patient doing well, recovered from recent hospitalization. He has had 17 bands placed for large esophageal varices in the past month or so, course complicated by post banding ulcer bleeding. We have discussed options for further therapy, TIPS vs. Repeat EGD with banding vs. No further treatment - all have risks. Following discussion of each he wishes to have additional band ligation. I have discussed risks for recurrent bleeding from this and risks of anesthesia, he understands and wishes to proceed. All questions answered, further recommendations pending results. Of note, Hgb has risen following hospitalization, most recent labs reviewed, see telephone note for lab results.     06/10/2020 10:50 AM  Corey Wood  has presented today for surgery, with the diagnosis of cirrhosis with ascites, esophageal varices with bleeding.  The various methods of treatment have been discussed with the patient and family. After consideration of risks, benefits and other options for treatment, the patient has consented to  Procedure(s): ESOPHAGOGASTRODUODENOSCOPY (EGD) WITH PROPOFOL (N/A) ESOPHAGEAL BANDING (N/A) as a surgical intervention.  The patient's history has been reviewed, patient examined, no change in status, stable for surgery.  I have reviewed the patient's chart and labs.  Questions were answered to the patient's satisfaction.     [place]

## 2020-06-10 NOTE — Op Note (Signed)
[facility] Patient Name: Corey Wood Procedure Date: 06/10/2020 MRN: [id number] Attending MD: Carlota Raspberry. Havery Moros , MD Date of Birth: 1950-09-15 CSN: [id number] Age: 70 Admit Type: Outpatient Procedure:                Upper GI endoscopy Indications:              For therapy of esophageal varices - history of                            large esophageal varices, intolerant of                            nonselective beta blockade. Initial EGD 04/17/20                            with 11 bands placed, patient readmitted for post                            banding ulcer bleed. Had 6 additional bands placed                            04/30/20. Has recovered well. Have discussed options                            with him including TIPS vs. repeated banding to                            eradicate varices, he has elected for repeat EGD                            with banding after discussion of options. Providers:                Carlota Raspberry. Havery Moros, MD, Particia Nearing, RN, Benetta Spar, Technician Referring MD:              Medicines:                Monitored Anesthesia Care Complications:            No immediate complications. Estimated blood loss:                            Minimal. Estimated Blood Loss:     Estimated blood loss was minimal. Procedure:                Pre-Anesthesia Assessment:                           - Prior to the procedure, a History and Physical                            was performed, and patient medications and  allergies were reviewed. The patient's tolerance of                            previous anesthesia was also reviewed. The risks                            and benefits of the procedure and the sedation                            options and risks were discussed with the patient.                            All questions were answered, and informed consent                            was  obtained. Prior Anticoagulants: The patient has                            taken no previous anticoagulant or antiplatelet                            agents. ASA Grade Assessment: III - A patient with                            severe systemic disease. After reviewing the risks                            and benefits, the patient was deemed in                            satisfactory condition to undergo the procedure.                           After obtaining informed consent, the endoscope was                            passed under direct vision. Throughout the                            procedure, the patient's blood pressure, pulse, and                            oxygen saturations were monitored continuously. The                            GIF-H190 ([id number]) Olympus gastroscope was                            introduced through the mouth, and advanced to the                            second part of duodenum. The upper GI endoscopy was  accomplished without difficulty. The patient                            tolerated the procedure well. Scope In: Scope Out: Findings:      Esophagogastric landmarks were identified: the Z-line was found at 40       cm, the gastroesophageal junction was found at 40 cm and the upper       extent of the gastric folds was found at 40 cm from the incisors.      Large varices were found in the middle third of the esophagus and in the       lower third of the esophagus, however despite the size overall       appearance was improved since the last 2 exams, smaller in diameter.       Some red markings noted in the distal esophagus along varices. Six bands       were successfully placed in the distal to mid esophagus resulting in       deflation of varices.      The exam of the esophagus was otherwise normal.      Type 2 gastroesophageal varices (GOV2, esophageal varices which extend       along the fundus) were found in the cardia.  They were small in largest       diameter.      Mild portal hypertensive gastropathy was found in the gastric body.      The exam of the stomach was otherwise normal.      The duodenal bulb and second portion of the duodenum were normal. Impression:               - Esophagogastric landmarks identified.                           - Large esophageal varices but improved overall in                            size / appearance compared to initial exam. Banded                            x 6 in the lower to mid esophagus.                           - Type 2 gastroesophageal varices (GOV2, esophageal                            varices which extend along the fundus).                           - Portal hypertensive gastropathy.                           - Normal duodenal bulb and second portion of the                            duodenum. Moderate Sedation:      No moderate sedation, case performed with MAC Recommendation:           - Patient has a contact number  available for                            emergencies. The signs and symptoms of potential                            delayed complications were discussed with the                            patient. Return to normal activities tomorrow.                            Written discharge instructions were provided to the                            patient.                           - Full liquid diet initially today, soft tomorrow,                            and then slowly advance as tolerated                           - Continue present medications.                           - Repeat upper endoscopy in 1-2 months for                            retreatment, our office will coordinate Procedure Code(s):        --- Professional ---                           [phone], Esophagogastroduodenoscopy, flexible,                            transoral; with band ligation of esophageal/gastric                            varices Diagnosis Code(s):        ---  Professional ---                           I85.00, Esophageal varices without bleeding                           I86.4, Gastric varices                           K76.6, Portal hypertension                           K31.89, Other diseases of stomach and duodenum CPT copyright 2019 [employer]. All rights reserved. The codes documented in this report are preliminary and upon coder review may  be revised to meet current compliance requirements. Remo Lipps P. Armbruster, MD 06/10/2020 11:27:34  AM This report has been signed electronically. Number of Addenda: 0

## 2020-06-10 NOTE — Telephone Encounter (Signed)
-----   Message from Yetta Flock, MD sent at 06/10/2020 11:40 AM EDT ----- Regarding: BMET Alanah Sakuma can you order a BMET to be done for this patient to be done on Monday, and leave them a message about it. I spoke with them today so they should be aware. He will hold his lasix until follow up labs are done. Thanks

## 2020-06-10 NOTE — Anesthesia Postprocedure Evaluation (Signed)
Anesthesia Post Note  Patient: Corey Wood  Procedure(s) Performed: ESOPHAGOGASTRODUODENOSCOPY (EGD) WITH PROPOFOL (N/A ) ESOPHAGEAL BANDING (N/A )     Patient location during evaluation: Endoscopy Anesthesia Type: MAC Level of consciousness: awake and alert Pain management: pain level controlled Vital Signs Assessment: post-procedure vital signs reviewed and stable Respiratory status: spontaneous breathing, nonlabored ventilation and respiratory function stable Cardiovascular status: stable and blood pressure returned to baseline Postop Assessment: no apparent nausea or vomiting Anesthetic complications: no   No complications documented.  Last Vitals:  Vitals:   06/10/20 1130 06/10/20 1140  BP: (!) 176/84 (!) 171/73  Pulse: 87 67  Resp: 16 19  Temp:    SpO2: 98% 96%    Last Pain:  Vitals:   06/10/20 1140  TempSrc:   PainSc: 0-No pain                 [name],W. EDMOND

## 2020-06-10 NOTE — Anesthesia Procedure Notes (Signed)
Date/Time: 06/10/2020 10:55 AM Performed by: Talbot Grumbling, CRNA Oxygen Delivery Method: Simple face mask

## 2020-06-10 NOTE — Anesthesia Preprocedure Evaluation (Addendum)
Anesthesia Evaluation  Patient identified by MRN, date of birth, ID band Patient awake    Reviewed: Allergy & Precautions, H&P , NPO status , Patient's Chart, lab work & pertinent test results  Airway Mallampati: III  TM Distance: >3 FB Neck ROM: Full    Dental no notable dental hx. (+) Teeth Intact, Dental Advisory Given   Pulmonary asthma , sleep apnea and Continuous Positive Airway Pressure Ventilation , COPD,  COPD inhaler,    Pulmonary exam normal breath sounds clear to auscultation       Cardiovascular hypertension, Pt. on medications  Rhythm:Regular Rate:Normal     Neuro/Psych Anxiety Depression negative neurological ROS     GI/Hepatic Neg liver ROS, GERD  Medicated,  Endo/Other  negative endocrine ROSdiabetes, Insulin DependentHypothyroidism   Renal/GU negative Renal ROS  negative genitourinary   Musculoskeletal  (+) Arthritis , Osteoarthritis,    Abdominal   Peds  Hematology  (+) Blood dyscrasia, anemia ,   Anesthesia Other Findings   Reproductive/Obstetrics negative OB ROS                            Anesthesia Physical Anesthesia Plan  ASA: III  Anesthesia Plan: MAC   Post-op Pain Management:    Induction: Intravenous  PONV Risk Score and Plan: 1 and Propofol infusion and Treatment may vary due to age or medical condition  Airway Management Planned: Simple Face Mask  Additional Equipment:   Intra-op Plan:   Post-operative Plan:   Informed Consent: I have reviewed the patients History and Physical, chart, labs and discussed the procedure including the risks, benefits and alternatives for the proposed anesthesia with the patient or authorized representative who has indicated his/her understanding and acceptance.     Dental advisory given  Plan Discussed with: CRNA  Anesthesia Plan Comments:         Anesthesia Quick Evaluation

## 2020-06-12 ENCOUNTER — Encounter (HOSPITAL_COMMUNITY): Payer: Self-pay | Admitting: Gastroenterology

## 2020-06-16 ENCOUNTER — Other Ambulatory Visit (INDEPENDENT_AMBULATORY_CARE_PROVIDER_SITE_OTHER): Payer: Medicare PPO

## 2020-06-16 DIAGNOSIS — R188 Other ascites: Secondary | ICD-10-CM | POA: Diagnosis not present

## 2020-06-16 DIAGNOSIS — K7460 Unspecified cirrhosis of liver: Secondary | ICD-10-CM | POA: Diagnosis not present

## 2020-06-16 LAB — BASIC METABOLIC PANEL
BUN: 28 mg/dL — ABNORMAL HIGH (ref 6–23)
CO2: 30 mEq/L (ref 19–32)
Calcium: 9.6 mg/dL (ref 8.4–10.5)
Chloride: 104 mEq/L (ref 96–112)
Creatinine, Ser: 1.65 mg/dL — ABNORMAL HIGH (ref 0.40–1.50)
GFR: 41.39 mL/min — ABNORMAL LOW (ref >60.00)
Glucose, Bld: 162 mg/dL — ABNORMAL HIGH (ref 70–99)
Potassium: 4.3 mEq/L (ref 3.5–5.1)
Sodium: 140 mEq/L (ref 135–145)

## 2020-06-17 ENCOUNTER — Other Ambulatory Visit: Payer: Self-pay

## 2020-06-17 DIAGNOSIS — R188 Other ascites: Secondary | ICD-10-CM

## 2020-06-17 DIAGNOSIS — K7460 Unspecified cirrhosis of liver: Secondary | ICD-10-CM

## 2020-06-24 ENCOUNTER — Telehealth: Payer: Self-pay

## 2020-06-24 ENCOUNTER — Other Ambulatory Visit: Payer: Self-pay

## 2020-06-24 DIAGNOSIS — I8501 Esophageal varices with bleeding: Secondary | ICD-10-CM

## 2020-06-24 DIAGNOSIS — K7460 Unspecified cirrhosis of liver: Secondary | ICD-10-CM

## 2020-06-24 DIAGNOSIS — I8510 Secondary esophageal varices without bleeding: Secondary | ICD-10-CM

## 2020-06-24 DIAGNOSIS — R188 Other ascites: Secondary | ICD-10-CM

## 2020-06-24 NOTE — Telephone Encounter (Signed)
Scheduled patient for EGD at [facility] on Monday, 5-16 at 10:15am, to arrive at 8:45am. Covid test has been scheduled for Thursday, 5-12 at [facility] and left detailed message for patient. Will mail his instructions and send to [place].

## 2020-06-24 NOTE — Telephone Encounter (Signed)
Called and spoke to pt. He would like to be scheduled for his EGD on Monday, 5-16. He would like Covid test in the am on Thursday, 5-12 and doesn't need a PV. He asked that I  Mail instructions and send to [place].

## 2020-07-01 ENCOUNTER — Telehealth: Payer: Self-pay

## 2020-07-01 NOTE — Telephone Encounter (Signed)
-----   Message from Yevette Edwards, RN sent at 06/17/2020  4:52 PM EDT ----- Regarding: Labs Repeat BMET, order in epic.

## 2020-07-01 NOTE — Telephone Encounter (Signed)
Spoke with patient and his wife to remind them that he is due for repeat lab work at this time. Wife states that she will bring him on Thursday, 07/03/20 when she comes in town for her appt. They are aware that no appt is necessary and they can stop by between 7:30 AM - 5 PM. Wife verbalized understanding and had no concerns at the end of the call.

## 2020-07-03 ENCOUNTER — Other Ambulatory Visit (INDEPENDENT_AMBULATORY_CARE_PROVIDER_SITE_OTHER): Payer: Medicare PPO

## 2020-07-03 DIAGNOSIS — K7460 Unspecified cirrhosis of liver: Secondary | ICD-10-CM | POA: Diagnosis not present

## 2020-07-03 DIAGNOSIS — R188 Other ascites: Secondary | ICD-10-CM | POA: Diagnosis not present

## 2020-07-03 LAB — BASIC METABOLIC PANEL
BUN: 14 mg/dL (ref 6–23)
CO2: 34 mEq/L — ABNORMAL HIGH (ref 19–32)
Calcium: 9.2 mg/dL (ref 8.4–10.5)
Chloride: 103 mEq/L (ref 96–112)
Creatinine, Ser: 1.53 mg/dL — ABNORMAL HIGH (ref 0.40–1.50)
GFR: 45.3 mL/min — ABNORMAL LOW (ref >60.00)
Glucose, Bld: 137 mg/dL — ABNORMAL HIGH (ref 70–99)
Potassium: 3.5 mEq/L (ref 3.5–5.1)
Sodium: 142 mEq/L (ref 135–145)

## 2020-07-15 ENCOUNTER — Telehealth: Payer: Self-pay | Admitting: Gastroenterology

## 2020-07-15 NOTE — Telephone Encounter (Signed)
So sorry to hear this. Looks like his EGD is scheduled for mid May. Given his recent CVA we should hold off on EGD for now and would cancel his procedure. I'd like to see him back in the office in 2 months or so to review everything and can reschedule once he has recovered. I can do a virtual follow up with them if he is still in rehab if that is easier for him. [place] can you contact his wife and let her know?  Jan, FYI, this patient will come off the schedule, we can discuss the wait list to see who may want to use this spot. Thanks

## 2020-07-15 NOTE — Telephone Encounter (Signed)
Spoke with Ivin Booty in regards to recommendations. She states that she hopes patient will be out of rehab by the time of his follow up appt but will call and let us know if they need a virtual appt. Patient is scheduled for a follow up with Dr. Havery Moros on Tuesday, 09/16/20 at 11 AM. She is aware that we have cancelled the EGD and pre-screening COVID test and will reschedule once patient has recovered. Ivin Booty verbalized understanding of all information and had no concerns at the end of the call.

## 2020-07-15 NOTE — Telephone Encounter (Signed)
Inbound call from patient wife Corey Wood. Patient had a stroke around 07/09/20 and currently in the hospital. He will be discharged to a rehab facility by the time of his procedure. She would like a call back on whether she should reschedule his procedure altogether or get him out of rehab for the procedure and take him back. Would like a call back [phone]

## 2020-07-15 NOTE — Telephone Encounter (Signed)
Dr. Armbruster, please see note below and advise. Thanks 

## 2020-08-07 ENCOUNTER — Other Ambulatory Visit (HOSPITAL_COMMUNITY): Payer: Medicare PPO

## 2020-08-11 ENCOUNTER — Encounter (HOSPITAL_COMMUNITY): Payer: Self-pay

## 2020-08-11 ENCOUNTER — Ambulatory Visit (HOSPITAL_COMMUNITY): Admit: 2020-08-11 | Payer: Medicare PPO | Admitting: Gastroenterology

## 2020-08-11 SURGERY — ESOPHAGOGASTRODUODENOSCOPY (EGD) WITH PROPOFOL
Anesthesia: Monitor Anesthesia Care

## 2020-08-28 DIAGNOSIS — R140 Abdominal distension (gaseous): Secondary | ICD-10-CM | POA: Diagnosis not present

## 2020-08-28 DIAGNOSIS — G9341 Metabolic encephalopathy: Secondary | ICD-10-CM | POA: Diagnosis not present

## 2020-08-28 DIAGNOSIS — I6782 Cerebral ischemia: Secondary | ICD-10-CM | POA: Diagnosis not present

## 2020-08-28 DIAGNOSIS — R4182 Altered mental status, unspecified: Secondary | ICD-10-CM | POA: Diagnosis not present

## 2020-08-28 DIAGNOSIS — R1110 Vomiting, unspecified: Secondary | ICD-10-CM | POA: Diagnosis not present

## 2020-08-28 DIAGNOSIS — J9811 Atelectasis: Secondary | ICD-10-CM | POA: Diagnosis not present

## 2020-08-28 DIAGNOSIS — R0602 Shortness of breath: Secondary | ICD-10-CM | POA: Diagnosis not present

## 2020-08-28 DIAGNOSIS — J90 Pleural effusion, not elsewhere classified: Secondary | ICD-10-CM | POA: Diagnosis not present

## 2020-08-28 DIAGNOSIS — G319 Degenerative disease of nervous system, unspecified: Secondary | ICD-10-CM | POA: Diagnosis not present

## 2020-08-28 DIAGNOSIS — J441 Chronic obstructive pulmonary disease with (acute) exacerbation: Secondary | ICD-10-CM | POA: Diagnosis not present

## 2020-08-28 DIAGNOSIS — J9601 Acute respiratory failure with hypoxia: Secondary | ICD-10-CM | POA: Diagnosis not present

## 2020-08-28 DIAGNOSIS — G238 Other specified degenerative diseases of basal ganglia: Secondary | ICD-10-CM | POA: Diagnosis not present

## 2020-08-29 DIAGNOSIS — J9601 Acute respiratory failure with hypoxia: Secondary | ICD-10-CM | POA: Diagnosis not present

## 2020-08-29 DIAGNOSIS — J441 Chronic obstructive pulmonary disease with (acute) exacerbation: Secondary | ICD-10-CM | POA: Diagnosis not present

## 2020-08-29 DIAGNOSIS — G9341 Metabolic encephalopathy: Secondary | ICD-10-CM | POA: Diagnosis not present

## 2020-08-29 DIAGNOSIS — R140 Abdominal distension (gaseous): Secondary | ICD-10-CM | POA: Diagnosis not present

## 2020-08-30 DIAGNOSIS — J441 Chronic obstructive pulmonary disease with (acute) exacerbation: Secondary | ICD-10-CM | POA: Diagnosis not present

## 2020-08-30 DIAGNOSIS — G9341 Metabolic encephalopathy: Secondary | ICD-10-CM | POA: Diagnosis not present

## 2020-08-30 DIAGNOSIS — J9601 Acute respiratory failure with hypoxia: Secondary | ICD-10-CM | POA: Diagnosis not present

## 2020-09-16 ENCOUNTER — Telehealth: Payer: Self-pay

## 2020-09-16 ENCOUNTER — Other Ambulatory Visit (INDEPENDENT_AMBULATORY_CARE_PROVIDER_SITE_OTHER): Payer: Medicare PPO

## 2020-09-16 ENCOUNTER — Encounter: Payer: Self-pay | Admitting: Gastroenterology

## 2020-09-16 ENCOUNTER — Other Ambulatory Visit: Payer: Self-pay

## 2020-09-16 ENCOUNTER — Ambulatory Visit (INDEPENDENT_AMBULATORY_CARE_PROVIDER_SITE_OTHER): Payer: No Typology Code available for payment source | Admitting: Gastroenterology

## 2020-09-16 VITALS — BP 132/64 | HR 100 | Ht 67.0 in | Wt 186.0 lb

## 2020-09-16 DIAGNOSIS — K7460 Unspecified cirrhosis of liver: Secondary | ICD-10-CM | POA: Diagnosis not present

## 2020-09-16 DIAGNOSIS — R188 Other ascites: Secondary | ICD-10-CM | POA: Diagnosis not present

## 2020-09-16 DIAGNOSIS — I8501 Esophageal varices with bleeding: Secondary | ICD-10-CM

## 2020-09-16 DIAGNOSIS — R609 Edema, unspecified: Secondary | ICD-10-CM

## 2020-09-16 LAB — BASIC METABOLIC PANEL
BUN: 16 mg/dL (ref 6–23)
CO2: 31 mEq/L (ref 19–32)
Calcium: 9 mg/dL (ref 8.4–10.5)
Chloride: 101 mEq/L (ref 96–112)
Creatinine, Ser: 1.52 mg/dL — ABNORMAL HIGH (ref 0.40–1.50)
GFR: 45.6 mL/min — ABNORMAL LOW (ref >60.00)
Glucose, Bld: 236 mg/dL — ABNORMAL HIGH (ref 70–99)
Potassium: 3.4 mEq/L — ABNORMAL LOW (ref 3.5–5.1)
Sodium: 138 mEq/L (ref 135–145)

## 2020-09-16 NOTE — Telephone Encounter (Signed)
Dr. Loni Muse reviewed pt's abdominal Xray from [facility] and has ordered a RUQ liver ultrasound.  Patient has been scheduled for  Tuesday, 6-28 at 10:30am, to arrive at 10:15 and be NPO after midnight.  Left detailed message on patient's home phone with information. MyChart message sent.

## 2020-09-16 NOTE — Progress Notes (Signed)
HPI :  70 year old male with a history of cirrhosis, here for follow-up visit.  See prior notes for full details of his case.  History of cryptogenic cirrhosis, thought to be possibly due to NASH.  Established with me last November.  Has been followed by hepatology with [place] in the past, followed primarily by Dr. Marjo Bicker at the [facility].   He has had a complicated course since have started following him.  Recall that he had a screening EGD that showed very large esophageal varices and go of 2 gastric varices, portal hypertensive gastritis.  Initially treated with propranolol but developed symptomatic bradycardia and this was stopped. He elected for primary therapy of large varices to be done with band ligation in this light. On 04/17/20 he underwent an EGD with me at the hospital , 11 bands placed and he did well. Unfortunately about a week later he presented with hematemesis and got admitted. EGD 1/28 per Dr. Benson Norway showed large varices with post banding ulcers but no stigmata of bleeding noted. He was observed for a few days on ocrteotide and PPI and discharged.  Unfortunately he represented again a few days later with again large-volume hematemesis and symptomatic anemia.  Another EGD, this time performed by Dr. Rush Landmark, showed large esophageal varices with an oozing post banding ulcer.  6 additional bands were placed.  He was in the hospital for a few days for observation.  Since his last visit with me he had an EGD in March which showed interval improvement in size of varices but there remained large.  6 bands placed with good result and he tolerated it well.  He did not have any postprocedural bleeding.  Since that particular encounter I have not seen him.  He has had a complicated course.  He was admitted to [facility] in April where he reportedly had a stroke.  Wife does not recall why they thought he had this, he has not been on any blood thinners or aspirin due to his risk for  GI bleeding.  He states he has made a full recovery from that.  He is able to walk using a walker.  It sounds like he is otherwise been admitted most recently to [facility] at the end of May, beginning of August with worsening ascites and fluid retention in his legs and lungs.  He states he was off diuretics at that time which were then resumed.  He is currently on Lasix 20 mg a day.  Recall he has had a history of acute kidney injury in the past while hospitalized, he has 1 kidney and baseline creatinine now around 1.5.  Recall that he has been evaluated for TIPS in the past and this was considered but he elected to go with additional variceal bleeding after discussion of this.  We have tried to get him established with [facility]ology locally [facility] [place] [facility], however the [place] denied that as they recently got a new hepatologist in [place].  The patient states he was seen by the [place] in [place] by a GI provider who "declined his case", and was told to seek care elsewhere.  The patient has been trying to reestablish with hepatology in [facility], he has an upcoming appointment with hepatology there but not sure when.  The patient had a follow-up EGD scheduled for me in May timeframe but this was canceled when he had his stroke.  He has had imaging of his abdomen done at the [place] and in  other healthcare centers but none on file today.  He states he had a CT scan done while in [place] of his abdomen and pelvis and told " his liver was in bad shape" but no records of this available.  We contacted [facility] and they only performed an abdominal x-ray, there is was no CT scan done recently.  He continues to take Protonix twice daily.  He continues to have problems with lower extremity edema and feels that his ascites is becoming tighter lately.  Denies any abdominal pain with this.  He denies any blood in his stools or recurrent bleeding.   Prior workup: EGD 01/30/20 - Esophagogastric  landmarks identified. - Large esophageal varices as described. - Erythematous mucosa in the gastric fundus and antrum, suspect portal hypertensive gastritis. Biopsies taken to rule out H pylori. - Suspected small type 2 gastroesophageal varices (GOV2, esophageal varices which extend along the fundus). - Normal duodenal bulb and second portion of the duodenum, very mild superficial erythema Noted.   Colonoscopy 01/30/20 - The perianal and digital rectal examinations were normal. - The terminal ileum appeared normal. - A 5 to 6 mm polyp was found in the transverse colon. The polyp was flat. The polyp was removed with a cold snare. Resection and retrieval were complete. - Moderately congested mucosa was found in the entire colon, suspected due to portal hypertension. - Internal hemorrhoids were found during retroflexion. - The exam was otherwise without abnormality.   1. Surgical [P], gastric antrum and gastric body - CHRONIC GASTRITIS. - WARTHIN-STARRY IS NEGATIVE FOR HELICOBACTER PYLORI. - NO INTESTINAL METAPLASIA, DYSPLASIA, OR MALIGNANCY. 2. Surgical [P], colon, transverse, polyp - TUBULAR ADENOMA. - NO HIGH GRADE DYSPLASIA OR MALIGNANCY.     EGD 04/17/20 - Esophagogastric landmarks identified. - Large esophageal varices. Banded x 11. - Type 2 gastroesophageal varices (GOV2, esophageal varices which extend along the fundus). - Erythematous mucosa in the antrum. - Normal duodenal bulb and second portion of the duodenum.   EGD 04/25/20 - Large (> 5 mm) esophageal varices. - Esophageal ulcers with no stigmata of recent bleeding. - Portal hypertensive gastropathy. - A medium amount of food (residue) in the stomach. - Normal examined duodenum. - No specimens collected.   EGD 04/30/20 - No gross lesions in esophagus proximally. - Grade I, grade II and grade III esophageal varices as well as multiple post-banding ulcers with a few ulcers showing active oozing. Near completely  eradicated after 6 bands were placed. No active extravasation noted thereafter. - Clotted blood in the entire stomach - suctioned and lavaged with mild clearance and did not see active re-accumulation of bleeding. - Type 2 gastroesophageal varices (GOV2, esophageal varices which extend along the fundus) - not completely visualized as had been at time of first endoscopy but no sing of active bleeding, but recent stigmata or nipple sign could have been missed due to blood in fundus. - Blood in the duodenal bulb and in the second portion of the duodenum - lavaged away.     Echo 05/01/20 - EF 60-65%   EGD 06/10/20 - Esophagogastric landmarks identified. - Large esophageal varices but improved overall in size / appearance compared to initial exam. Banded x 6 in the lower to mid esophagus. - Type 2 gastroesophageal varices (GOV2, esophageal varices which extend along the fundus). - Portal hypertensive gastropathy. - Normal duodenal bulb and second portion of the duodenum.   Patient had a CVA in April, follow up EGD was cancelled  Admitted to [facility]  5/21/08/30/20 for volume overload  [facility] - Hgb 10.4, MCV 88, plt 72 BUN 29, Cr 1.47 INR 1.18 Alb 3.0, T bil 0.8, AP 142, AST , ALT 47        Past Medical History:  Diagnosis Date   Allergy    seasonal allergies   Anxiety    on meds   Aortic valve disorder    Arthritis    generalized (fingers)(shoulders)   Asthma    uses inhaler   Cataract    bilateral -sx    Cirrhosis (HCC)    CKD (chronic kidney disease), stage III (HCC)    only has one kidney   COPD (chronic obstructive pulmonary disease) (HCC)    Depression    on meds   DM (diabetes mellitus) ([place])    on meds   GERD (gastroesophageal reflux disease)    on meds   Heart murmur    History of colon polyps    Hx of acute pancreatitis 10/2019   Hyperlipidemia    on meds   Hypertension    on meds   Hypothyroidism    not on meds at this time    Myelodysplastic syndrome ([place])    Neuromuscular disorder ([place])    per pt   Obstructive sleep apnea    Parkinsonism ([place])    per pt report   Peripheral edema    bilateral legs   Peripheral positional vertigo      Past Surgical History:  Procedure Laterality Date   ANKLE SURGERY     CHOLECYSTECTOMY     ENDARTERECTOMY Left 04/29/2020   Procedure: CAROTID EXPLORATION removal of  central venous catheter;  Surgeon: Rosetta Posner, MD;  Location: [facility] OR;  Service: Vascular;  Laterality: Left;   ESOPHAGEAL BANDING N/A 04/17/2020   Procedure: ESOPHAGEAL BANDING;  Surgeon: Yetta Flock, MD;  Location: [facility] ENDOSCOPY;  Service: Gastroenterology;  Laterality: N/A;   ESOPHAGEAL BANDING  04/29/2020   Procedure: ESOPHAGEAL BANDING;  Surgeon: Rush Landmark Telford Nab., MD;  Location: [place];  Service: Gastroenterology;;   ESOPHAGEAL BANDING N/A 06/10/2020   Procedure: ESOPHAGEAL BANDING;  Surgeon: Yetta Flock, MD;  Location: [facility];  Service: Gastroenterology;  Laterality: N/A;   ESOPHAGOGASTRODUODENOSCOPY N/A 04/25/2020   Procedure: ESOPHAGOGASTRODUODENOSCOPY (EGD);  Surgeon: Carol Ada, MD;  Location: [facility];  Service: Endoscopy;  Laterality: N/A;   ESOPHAGOGASTRODUODENOSCOPY (EGD) WITH PROPOFOL N/A 04/17/2020   Procedure: ESOPHAGOGASTRODUODENOSCOPY (EGD) WITH PROPOFOL;  Surgeon: Yetta Flock, MD;  Location: [facility];  Service: Gastroenterology;  Laterality: N/A;   ESOPHAGOGASTRODUODENOSCOPY (EGD) WITH PROPOFOL N/A 04/29/2020   Procedure: ESOPHAGOGASTRODUODENOSCOPY (EGD) WITH PROPOFOL;  Surgeon: Rush Landmark Telford Nab., MD;  Location: [place];  Service: Gastroenterology;  Laterality: N/A;   ESOPHAGOGASTRODUODENOSCOPY (EGD) WITH PROPOFOL N/A 06/10/2020   Procedure: ESOPHAGOGASTRODUODENOSCOPY (EGD) WITH PROPOFOL;  Surgeon: Yetta Flock, MD;  Location: [facility];  Service: Gastroenterology;  Laterality: N/A;   EYE SURGERY     HERNIA REPAIR     KIDNEY  STONE SURGERY     WISDOM TOOTH EXTRACTION     Family History  Problem Relation Age of Onset   Liver cancer Mother    Colon cancer Neg Hx    Stomach cancer Neg Hx    Social History   Tobacco Use   Smoking status: Never   Smokeless tobacco: Never  Vaping Use   Vaping Use: Never used  Substance Use Topics   Alcohol use: Not Currently   Drug use: Not Currently  Current Outpatient Medications  Medication Sig Dispense Refill   albuterol (VENTOLIN HFA) 108 (90 Base) MCG/ACT inhaler Inhale 2 puffs into the lungs daily as needed for wheezing or shortness of breath.     ARTIFICIAL SALIVA MT Use as directed 4 sprays in the mouth or throat at bedtime.     atorvastatin (LIPITOR) 40 MG tablet Take 40 mg by mouth at bedtime.     brimonidine (ALPHAGAN) 0.2 % ophthalmic solution Place 1 drop into both eyes in the morning and at bedtime.     buPROPion (WELLBUTRIN XL) 150 MG 24 hr tablet Take 150 mg by mouth daily.     cetirizine (ZYRTEC) 10 MG tablet Chew 10 mg by mouth at bedtime.     citalopram (CELEXA) 40 MG tablet Take 40 mg by mouth daily.     diclofenac Sodium (VOLTAREN) 1 % GEL Apply 2 g topically 2 (two) times daily as needed (pain).     ferrous sulfate 325 (65 FE) MG tablet TAKE 1 TABLET (325 MG TOTAL) BY MOUTH TWO TIMES DAILY WITH A MEAL. (Patient taking differently: Take 325 mg by mouth every other day.) 60 tablet 0   furosemide (LASIX) 20 MG tablet Take 20 mg by mouth daily.     glucose 4 GM chewable tablet Chew 1 tablet by mouth daily as needed for low blood sugar (If drop below 70).     insulin glargine (LANTUS) 100 unit/mL SOPN Inject 18 Units into the skin daily.     ipratropium (ATROVENT) 0.03 % nasal spray Place 2 sprays into both nostrils at bedtime.     meclizine (ANTIVERT) 25 MG tablet Take 25 mg by mouth 2 (two) times daily as needed for dizziness.     pantoprazole (PROTONIX) 40 MG tablet TAKE 1 TABLET (40 MG TOTAL) BY MOUTH TWO TIMES DAILY BEFORE A MEAL. (Patient taking  differently: Take 40 mg by mouth daily.) 60 tablet 0   tamsulosin (FLOMAX) 0.4 MG CAPS capsule Take 0.4 mg by mouth 2 (two) times daily.     Current Facility-Administered Medications  Medication Dose Route Frequency Provider Last Rate Last Admin   0.9 %  sodium chloride infusion  500 mL Intravenous Once Sharian Delia, Carlota Raspberry, MD       Allergies  Allergen Reactions   Lisinopril Cough     Review of Systems: All systems reviewed and negative except where noted in HPI.   Lab Results  Component Value Date   WBC 3.7 (L) 05/05/2020   HGB 7.3 (L) 05/05/2020   HCT 21.0 (L) 05/05/2020   MCV 93.3 05/05/2020   PLT 55 (L) 05/05/2020    Lab Results  Component Value Date   CREATININE 1.52 (H) 09/16/2020   BUN 16 09/16/2020   NA 138 09/16/2020   K 3.4 (L) 09/16/2020   CL 101 09/16/2020   CO2 31 09/16/2020    Lab Results  Component Value Date   ALT 66 (H) 05/05/2020   AST 27 05/05/2020   ALKPHOS 63 05/05/2020   BILITOT 0.5 05/05/2020      Physical Exam: BP 132/64   Pulse 100   Ht 5\' 7"  (1.702 m)   Wt 186 lb (84.4 kg)   BMI 29.13 kg/m  Constitutional: Pleasant, male in no acute distress. Pulmonary/chest: Effort normal and breath sounds normal.  Abdominal: Soft, tight ascites, no tenderness,  There are no masses palpable.  Extremities: (+) 1-2 LE edema Lymphadenopathy: No cervical adenopathy noted. Neurological: Alert and oriented to person place and time.  Skin: Skin is warm and dry. No rashes noted. Psychiatric: Normal mood and affect. Behavior is normal.   ASSESSMENT AND PLAN: 70 year old male here for reassessment of following:  Cirrhosis with ascites Esophageal varices with bleeding Edema  Decompensated cirrhosis with very large varices.  He did not tolerate nonselective beta-blockade due to bradycardia, elected to treat th varices with banding, he unfortunately was hospitalized with a post banding ulcer bleed but has recovered from that.  In recent months his  course has been complicated by a stroke in April and then admission 3 weeks ago to [facility] for volume overload.  He had an echocardiogram in February with [place] which looked okay we were considering a TIPS procedure.  He is generally better than when he was admitted but continues to have some edema in his lower legs, he has some tight ascites on exam today.  He has baseline kidney function creatinine 1.5 with only 1 kidney, we have to be careful with diuretics.  I repeated his renal function today which was stable, we will add Aldactone 50 mg to his Lasix 20 mg and repeat another lab in 1 week.  I think it might take some time to get the ascites controlled so we will refer him for large-volume paracentesis and then will give albumin if greater than 5 L removed and send a cell count with differential.  We reached out to [facility] to get prior imaging, specifically CT scan that was reported but they only have an x-ray.  He is due for [facility] screening and will refer for an ultrasound to make sure stable.  He does need to follow-up with hepatology in case he is considered for transplant, he has had worsening decompensation in the past few months.  Unfortunately his care has been fragmented at the [place] as [facility], then told him to see a hepatologist at the [place] who subsequently declined his case, now trying to [facility]establish [facility].  Otherwise due for surveillance EGD at this time for banding of varices, he tolerated the last exam well.  I will reach out to his primary provider at the [place] to make sure he is cleared from this from a stroke perspective, he appears to have made a fair recovery from that so far.  He will otherwise continue low-sodium diet and I will see him again next month for reassessment.  He will contact me sooner if any problems or worsening with his fluid status.  He is abstaining from alcohol.  Plan: - labs done today, add aldactone 50mg  / day to lasix 20mg  /  day, repeat BMET in 1 week - refer for large volume paraentesis, if > 5 L removed will give albumin, send cell count with diff - RUQ [place] for [place] screening - low Na diet - follow up with Hepatology at [place] - EGD with banding of varices at hospital - needs clearance from [place] PCP Dr. Marjo Bicker in regards to CVA in April. We discussed TIPS procedure, if his ascites becomes more challenging to treat we may need to consider this. No significant encephalopathy at this time but will await hepatology opinion on this. - follow up next month  Jolly Mango, MD [facility] Gastroenterology

## 2020-09-16 NOTE — Patient Instructions (Addendum)
If you are age 72 or older, your body mass index should be between 23-30. Your Body mass index is 29.13 kg/m. If this is out of the aforementioned range listed, please consider follow up with your Primary Care Provider.  If you are age 45 or younger, your body mass index should be between 19-25. Your Body mass index is 29.13 kg/m. If this is out of the aformentioned range listed, please consider follow up with your Primary Care Provider.   __________________________________________________________  The [place] GI providers would like to encourage you to use [facility] to communicate with providers for non-urgent requests or questions.  Due to long hold times on the telephone, sending your provider a message by [facility] may be a faster and more efficient way to get a response.  Please allow 48 business hours for a response.  Please remember that this is for non-urgent requests.  __________________________________________________________  Dennis Bast have been scheduled for an endoscopy on Tuesday, 7-26 at 11:30 am. Please arrive at 10:00 am. Please follow written instructions given to you at your visit today. If you use inhalers (even only as needed), please bring them with you on the day of your procedure.  We will request clearance from Dr. Marjo Bicker from the [facility] for you to have your EGD at [facility].  You have been scheduled for an abdominal paracentesis at [facility] radiology (1st floor of hospital) on Thursday, 6-23 at 10:00 am. Please arrive at least 15 minutes prior to your appointment time for registration. Should you need to reschedule this appointment for any reason, please call our office at [phone].    We will request a copy of your CT scan from [facility].   Thank you for entrusting me with your care and for choosing [facility], Dr. [place] Cellar

## 2020-09-17 ENCOUNTER — Telehealth: Payer: Self-pay | Admitting: Gastroenterology

## 2020-09-17 NOTE — Telephone Encounter (Signed)
Request of service form completed and faxed to Dr. Laurance Flatten at [facility] along with latest office note and lab work.

## 2020-09-17 NOTE — Telephone Encounter (Signed)
Inbound call from [place]. States patient referral expired 07/16/20. In order to continue with procedure scheduled for 7/26 @ [facility]. There needs to be a request of service form and latest progress notes faxed to Dr. Laurance Flatten at [facility]. Fax [phone]

## 2020-09-18 ENCOUNTER — Other Ambulatory Visit: Payer: Self-pay

## 2020-09-18 ENCOUNTER — Ambulatory Visit (HOSPITAL_COMMUNITY)
Admission: RE | Admit: 2020-09-18 | Discharge: 2020-09-18 | Disposition: A | Discharge status: Home / Self Care | Payer: No Typology Code available for payment source | Source: Ambulatory Visit | Attending: Gastroenterology | Admitting: Gastroenterology

## 2020-09-18 ENCOUNTER — Encounter: Payer: Self-pay | Admitting: Student

## 2020-09-18 DIAGNOSIS — R188 Other ascites: Secondary | ICD-10-CM | POA: Diagnosis present

## 2020-09-18 DIAGNOSIS — K7460 Unspecified cirrhosis of liver: Secondary | ICD-10-CM

## 2020-09-18 DIAGNOSIS — I8501 Esophageal varices with bleeding: Secondary | ICD-10-CM

## 2020-09-18 DIAGNOSIS — R609 Edema, unspecified: Secondary | ICD-10-CM

## 2020-09-18 HISTORY — PX: IR PARACENTESIS: IMG2679

## 2020-09-18 LAB — BODY FLUID CELL COUNT WITH DIFFERENTIAL
Eos, Fluid: 1 %
Lymphs, Fluid: 83 %
Monocyte-Macrophage-Serous Fluid: 13 % — ABNORMAL LOW (ref 50–90)
Neutrophil Count, Fluid: 2 % (ref 0–25)
Total Nucleated Cell Count, Fluid: 238 cu mm (ref 0–1000)

## 2020-09-18 MED ORDER — LIDOCAINE HCL 1 % IJ SOLN
INTRAMUSCULAR | Status: AC
  Filled 2020-09-18: qty 20

## 2020-09-18 MED ORDER — SPIRONOLACTONE 50 MG PO TABS: 50.0000 mg | ORAL_TABLET | Freq: Every day | ORAL | 2 refills | Status: DC

## 2020-09-18 MED ORDER — LIDOCAINE HCL 1 % IJ SOLN
INTRAMUSCULAR | Status: DC | PRN
  Administered 2020-09-18: 10 mL

## 2020-09-18 NOTE — Procedures (Signed)
PROCEDURE SUMMARY:  Successful US guided paracentesis from left abdomen Yielded 1.7 L of clear yellow fluid.  No immediate complications.  Pt tolerated well.   Specimen sent for labs.  EBL < 1 mL  Theresa Duty, NP 09/18/2020 11:21 AM

## 2020-09-20 LAB — PATHOLOGIST SMEAR REVIEW

## 2020-09-22 NOTE — Telephone Encounter (Signed)
Received referral from [place] # [id number],  VALID 09/16/20-03/17/21. Scanned to chart under Media, 'Insurance card'

## 2020-09-23 ENCOUNTER — Telehealth: Payer: Self-pay | Admitting: Gastroenterology

## 2020-09-23 ENCOUNTER — Ambulatory Visit (HOSPITAL_COMMUNITY)
Admission: RE | Admit: 2020-09-23 | Discharge: 2020-09-23 | Disposition: A | Discharge status: Home / Self Care | Payer: No Typology Code available for payment source | Source: Ambulatory Visit | Attending: Gastroenterology | Admitting: Gastroenterology

## 2020-09-23 ENCOUNTER — Other Ambulatory Visit: Payer: Self-pay

## 2020-09-23 DIAGNOSIS — R188 Other ascites: Secondary | ICD-10-CM | POA: Diagnosis present

## 2020-09-23 DIAGNOSIS — R609 Edema, unspecified: Secondary | ICD-10-CM

## 2020-09-23 DIAGNOSIS — I8501 Esophageal varices with bleeding: Secondary | ICD-10-CM | POA: Insufficient documentation

## 2020-09-23 DIAGNOSIS — K7460 Unspecified cirrhosis of liver: Secondary | ICD-10-CM

## 2020-09-23 NOTE — Telephone Encounter (Signed)
Inbound call from the Nurse Tonya from [place] stating that she was returning a phone call. Tonya received the note for pt to be cleared and she will be forwarding the note to pt's pcp. She can be contacted at [phone]. Thanks.

## 2020-09-25 NOTE — Telephone Encounter (Signed)
Will await clearance from Dr. Marjo Bicker, PCP, at the [facility] for patient's EGD at [facility] on 7-26.

## 2020-09-26 ENCOUNTER — Telehealth: Payer: Self-pay | Admitting: Gastroenterology

## 2020-09-26 ENCOUNTER — Other Ambulatory Visit (INDEPENDENT_AMBULATORY_CARE_PROVIDER_SITE_OTHER): Payer: No Typology Code available for payment source

## 2020-09-26 ENCOUNTER — Other Ambulatory Visit: Payer: Self-pay | Admitting: *Deleted

## 2020-09-26 DIAGNOSIS — K7460 Unspecified cirrhosis of liver: Secondary | ICD-10-CM

## 2020-09-26 DIAGNOSIS — R609 Edema, unspecified: Secondary | ICD-10-CM

## 2020-09-26 DIAGNOSIS — R188 Other ascites: Secondary | ICD-10-CM

## 2020-09-26 LAB — BASIC METABOLIC PANEL
BUN: 14 mg/dL (ref 6–23)
CO2: 30 mEq/L (ref 19–32)
Calcium: 9.1 mg/dL (ref 8.4–10.5)
Chloride: 103 mEq/L (ref 96–112)
Creatinine, Ser: 1.49 mg/dL (ref 0.40–1.50)
GFR: 46.69 mL/min — ABNORMAL LOW (ref >60.00)
Glucose, Bld: 153 mg/dL — ABNORMAL HIGH (ref 70–99)
Potassium: 3.4 mEq/L — ABNORMAL LOW (ref 3.5–5.1)
Sodium: 141 mEq/L (ref 135–145)

## 2020-09-26 NOTE — Telephone Encounter (Signed)
[place] pts wife calling, reports pt is having fluid build up in his belly again. Last IR para done 6/23 and 1.7 liters drawn off. Pt requesting to have another IR para scheduled. Please advise as DOD.

## 2020-09-26 NOTE — Telephone Encounter (Signed)
Cook for LVP, though very little fluid was removed recently. If > 5L ascites removed he should have 50g IV albumin JMP

## 2020-09-30 NOTE — Telephone Encounter (Signed)
Dr. Havery Moros, have not scheduled ir para yet, saw on result note where you mentioned possibly increasing the diuretic if fluid building up. Pt reported to another nurse that everything was "doing good." Please advise.

## 2020-09-30 NOTE — Telephone Encounter (Signed)
Spoke with pts wife and they will increase the aldactone to 100mg  daily and lasix to 40mg  daily. She knows to come in 1 week for repeat lab.

## 2020-10-03 NOTE — Telephone Encounter (Signed)
Left detailed message for Nurse Navigator Lynn Ito at [phone] that we are still waiting on clearance from patient's PCP Dr. Marjo Bicker for his EGD scheduled for 10-21-20 at the hospital. Letter faxed on 09-16-20.  Rec'd confirmation that they rec'd the request on 6-28.

## 2020-10-10 ENCOUNTER — Telehealth: Payer: Self-pay | Admitting: Gastroenterology

## 2020-10-10 NOTE — Telephone Encounter (Signed)
Spoke with Santiago Glad, she states that patient will take his morning medications and begin to eat breakfast and during breakfast he has to vomit. She states that this only happens with breakfast, no other meals. Santiago Glad states that patient is still taking Protonix BID, she denies that patient has any dysphagia. Please advise, thanks.

## 2020-10-10 NOTE — Telephone Encounter (Signed)
Inbound call from nurse Santiago Glad with [place] stating patient has not been able to keep any food or medication down.  Please advise.  Can be reached at [phone].

## 2020-10-10 NOTE — Telephone Encounter (Signed)
Lm on vm for Santiago Glad to return call.

## 2020-10-10 NOTE — Telephone Encounter (Signed)
He is having an EGD with me in < 2 weeks so we will evaluate this. Unclear what is driving it. If he is nauseated we can give him some Zofran 4mg  ODT to take in the AM if he wants to try that. He should eat a light breakfast otherwise or perhaps wait a few hours later to eat his first meal

## 2020-10-13 MED ORDER — ONDANSETRON HCL 4 MG PO TABS: 4.0000 mg | ORAL_TABLET | Freq: Three times a day (TID) | ORAL | 1 refills | Status: DC | PRN

## 2020-10-13 MED ORDER — ONDANSETRON 4 MG PO TBDP: 4.0000 mg | ORAL_TABLET | Freq: Three times a day (TID) | ORAL | 0 refills | Status: DC | PRN

## 2020-10-13 NOTE — Telephone Encounter (Signed)
Spoke with patient's wife in regards to recommendations. She requested that the prescription be sent to [facility]. She states that she gave the patient 2 eggs and toast this morning and he was still nauseated with vomiting. Advised that she can try grits or oatmeal but she states that patient doesn't eat those. Advised to try recommendations for a little while and see how he does. Advised patient's wife to call us if his symptoms worsen or don't improve at all. Patient's wife verbalized understanding and had no concerns at the end of the call.

## 2020-10-13 NOTE — Telephone Encounter (Signed)
Called and spoke to [place]. Dr. Marjo Bicker has been on vacation. She indicated she would send another request to him today and hope we get a reply today seeing that pt's procedure is scheduled for next week.

## 2020-10-14 ENCOUNTER — Inpatient Hospital Stay (HOSPITAL_COMMUNITY)
Admission: EM | Admit: 2020-10-14 | Discharge: 2020-10-16 | DRG: 178 | Disposition: A | Discharge status: Home Health | Payer: Non-veteran care | Attending: Internal Medicine | Admitting: Internal Medicine

## 2020-10-14 ENCOUNTER — Emergency Department (HOSPITAL_COMMUNITY): Payer: Non-veteran care

## 2020-10-14 ENCOUNTER — Encounter (HOSPITAL_COMMUNITY): Payer: Self-pay | Admitting: Student

## 2020-10-14 ENCOUNTER — Other Ambulatory Visit: Payer: Self-pay

## 2020-10-14 ENCOUNTER — Telehealth: Payer: Self-pay | Admitting: Gastroenterology

## 2020-10-14 DIAGNOSIS — K7460 Unspecified cirrhosis of liver: Secondary | ICD-10-CM | POA: Diagnosis present

## 2020-10-14 DIAGNOSIS — D61818 Other pancytopenia: Secondary | ICD-10-CM | POA: Diagnosis present

## 2020-10-14 DIAGNOSIS — U071 COVID-19: Secondary | ICD-10-CM | POA: Diagnosis not present

## 2020-10-14 DIAGNOSIS — Z800 Family history of malignant neoplasm of digestive organs: Secondary | ICD-10-CM

## 2020-10-14 DIAGNOSIS — E1165 Type 2 diabetes mellitus with hyperglycemia: Secondary | ICD-10-CM | POA: Diagnosis present

## 2020-10-14 DIAGNOSIS — Z794 Long term (current) use of insulin: Secondary | ICD-10-CM | POA: Diagnosis not present

## 2020-10-14 DIAGNOSIS — F0390 Unspecified dementia without behavioral disturbance: Secondary | ICD-10-CM | POA: Diagnosis present

## 2020-10-14 DIAGNOSIS — Z888 Allergy status to other drugs, medicaments and biological substances status: Secondary | ICD-10-CM

## 2020-10-14 DIAGNOSIS — I129 Hypertensive chronic kidney disease with stage 1 through stage 4 chronic kidney disease, or unspecified chronic kidney disease: Secondary | ICD-10-CM | POA: Diagnosis present

## 2020-10-14 DIAGNOSIS — K7469 Other cirrhosis of liver: Secondary | ICD-10-CM | POA: Diagnosis present

## 2020-10-14 DIAGNOSIS — R059 Cough, unspecified: Secondary | ICD-10-CM | POA: Diagnosis present

## 2020-10-14 DIAGNOSIS — E860 Dehydration: Secondary | ICD-10-CM | POA: Diagnosis present

## 2020-10-14 DIAGNOSIS — D649 Anemia, unspecified: Secondary | ICD-10-CM | POA: Diagnosis present

## 2020-10-14 DIAGNOSIS — N179 Acute kidney failure, unspecified: Secondary | ICD-10-CM | POA: Diagnosis present

## 2020-10-14 DIAGNOSIS — N1831 Chronic kidney disease, stage 3a: Secondary | ICD-10-CM | POA: Diagnosis present

## 2020-10-14 DIAGNOSIS — F419 Anxiety disorder, unspecified: Secondary | ICD-10-CM | POA: Diagnosis present

## 2020-10-14 DIAGNOSIS — Z79899 Other long term (current) drug therapy: Secondary | ICD-10-CM

## 2020-10-14 DIAGNOSIS — J90 Pleural effusion, not elsewhere classified: Secondary | ICD-10-CM | POA: Diagnosis not present

## 2020-10-14 DIAGNOSIS — E876 Hypokalemia: Secondary | ICD-10-CM | POA: Diagnosis present

## 2020-10-14 DIAGNOSIS — E785 Hyperlipidemia, unspecified: Secondary | ICD-10-CM | POA: Diagnosis present

## 2020-10-14 DIAGNOSIS — Z7984 Long term (current) use of oral hypoglycemic drugs: Secondary | ICD-10-CM

## 2020-10-14 DIAGNOSIS — F32A Depression, unspecified: Secondary | ICD-10-CM | POA: Diagnosis present

## 2020-10-14 DIAGNOSIS — J449 Chronic obstructive pulmonary disease, unspecified: Secondary | ICD-10-CM | POA: Diagnosis present

## 2020-10-14 DIAGNOSIS — R0902 Hypoxemia: Secondary | ICD-10-CM | POA: Diagnosis present

## 2020-10-14 DIAGNOSIS — E1122 Type 2 diabetes mellitus with diabetic chronic kidney disease: Secondary | ICD-10-CM | POA: Diagnosis present

## 2020-10-14 DIAGNOSIS — K219 Gastro-esophageal reflux disease without esophagitis: Secondary | ICD-10-CM | POA: Diagnosis present

## 2020-10-14 DIAGNOSIS — Z8616 Personal history of COVID-19: Secondary | ICD-10-CM

## 2020-10-14 DIAGNOSIS — G4733 Obstructive sleep apnea (adult) (pediatric): Secondary | ICD-10-CM | POA: Diagnosis present

## 2020-10-14 DIAGNOSIS — R531 Weakness: Secondary | ICD-10-CM | POA: Diagnosis not present

## 2020-10-14 DIAGNOSIS — E119 Type 2 diabetes mellitus without complications: Secondary | ICD-10-CM

## 2020-10-14 DIAGNOSIS — Z86010 Personal history of colonic polyps: Secondary | ICD-10-CM

## 2020-10-14 DIAGNOSIS — Z9049 Acquired absence of other specified parts of digestive tract: Secondary | ICD-10-CM

## 2020-10-14 HISTORY — DX: Personal history of COVID-19: Z86.16

## 2020-10-14 LAB — COMPREHENSIVE METABOLIC PANEL
ALT: 17 U/L (ref 0–44)
AST: 28 U/L (ref 15–41)
Albumin: 3.5 g/dL (ref 3.5–5.0)
Alkaline Phosphatase: 89 U/L (ref 38–126)
Anion gap: 12 (ref 5–15)
BUN: 17 mg/dL (ref 8–23)
CO2: 30 mmol/L (ref 22–32)
Calcium: 9.1 mg/dL (ref 8.9–10.3)
Chloride: 95 mmol/L — ABNORMAL LOW (ref 98–111)
Creatinine, Ser: 1.61 mg/dL — ABNORMAL HIGH (ref 0.61–1.24)
GFR, Estimated: 45 mL/min — ABNORMAL LOW (ref >60)
Glucose, Bld: 120 mg/dL — ABNORMAL HIGH (ref 70–99)
Potassium: 3.3 mmol/L — ABNORMAL LOW (ref 3.5–5.1)
Sodium: 137 mmol/L (ref 135–145)
Total Bilirubin: 0.8 mg/dL (ref 0.3–1.2)
Total Protein: 8 g/dL (ref 6.5–8.1)

## 2020-10-14 LAB — CBC WITH DIFFERENTIAL/PLATELET
Abs Immature Granulocytes: 0.03 10*3/uL (ref 0.00–0.07)
Basophils Absolute: 0 10*3/uL (ref 0.0–0.1)
Basophils Relative: 0 %
Eosinophils Absolute: 0.1 10*3/uL (ref 0.0–0.5)
Eosinophils Relative: 4 %
HCT: 30.5 % — ABNORMAL LOW (ref 39.0–52.0)
Hemoglobin: 9.9 g/dL — ABNORMAL LOW (ref 13.0–17.0)
Immature Granulocytes: 1 %
Lymphocytes Relative: 21 %
Lymphs Abs: 0.6 10*3/uL — ABNORMAL LOW (ref 0.7–4.0)
MCH: 29.2 pg (ref 26.0–34.0)
MCHC: 32.5 g/dL (ref 30.0–36.0)
MCV: 90 fL (ref 80.0–100.0)
Monocytes Absolute: 0.3 10*3/uL (ref 0.1–1.0)
Monocytes Relative: 10 %
Neutro Abs: 1.7 10*3/uL (ref 1.7–7.7)
Neutrophils Relative %: 64 %
Platelets: 71 10*3/uL — ABNORMAL LOW (ref 150–400)
RBC: 3.39 MIL/uL — ABNORMAL LOW (ref 4.22–5.81)
RDW: 15.9 % — ABNORMAL HIGH (ref 11.5–15.5)
WBC: 2.7 10*3/uL — ABNORMAL LOW (ref 4.0–10.5)
nRBC: 0 % (ref 0.0–0.2)

## 2020-10-14 LAB — URINALYSIS, ROUTINE W REFLEX MICROSCOPIC
Bacteria, UA: NONE SEEN
Bilirubin Urine: NEGATIVE
Glucose, UA: NEGATIVE mg/dL
Ketones, ur: NEGATIVE mg/dL
Leukocytes,Ua: NEGATIVE
Nitrite: NEGATIVE
Protein, ur: NEGATIVE mg/dL
Specific Gravity, Urine: 1.014 (ref 1.005–1.030)
pH: 5 (ref 5.0–8.0)

## 2020-10-14 LAB — RESP PANEL BY RT-PCR (FLU A&B, COVID) ARPGX2
Influenza A by PCR: NEGATIVE
Influenza B by PCR: NEGATIVE
SARS Coronavirus 2 by RT PCR: POSITIVE — AB

## 2020-10-14 LAB — LACTIC ACID, PLASMA
Lactic Acid, Venous: 1.3 mmol/L (ref 0.5–1.9)
Lactic Acid, Venous: 0.8 mmol/L (ref 0.5–1.9)

## 2020-10-14 LAB — PROTIME-INR
INR: 1.1 (ref 0.8–1.2)
Prothrombin Time: 13.9 seconds (ref 11.4–15.2)

## 2020-10-14 LAB — APTT: aPTT: 34 seconds (ref 24–36)

## 2020-10-14 LAB — AMMONIA: Ammonia: 40 umol/L — ABNORMAL HIGH (ref 9–35)

## 2020-10-14 MED ORDER — SODIUM CHLORIDE 0.9 % IV SOLN
100.0000 mg | Freq: Once | INTRAVENOUS | Status: AC
  Administered 2020-10-14: 100 mg via INTRAVENOUS
  Filled 2020-10-14: qty 20

## 2020-10-14 MED ORDER — SODIUM CHLORIDE 0.9 % IV SOLN
100.0000 mg | Freq: Every day | INTRAVENOUS | Status: DC
  Administered 2020-10-15: 100 mg via INTRAVENOUS
  Filled 2020-10-14: qty 20

## 2020-10-14 MED ORDER — ACETAMINOPHEN 325 MG PO TABS
650.0000 mg | ORAL_TABLET | Freq: Once | ORAL | Status: AC
  Administered 2020-10-14: 650 mg via ORAL
  Filled 2020-10-14: qty 2

## 2020-10-14 NOTE — Telephone Encounter (Signed)
Spoke with patient's wife in regards to recommendations. Ivin Booty states that patient's PCP is getting ready tor retire so she probably can't get him in there but she will take him to a walk in clinic today. Ivin Booty had no other concerns at the end of the call.

## 2020-10-14 NOTE — Telephone Encounter (Signed)
Thanks for letting me know, sorry to hear this.  Unclear what is causing his cough but he needs to have an evaluation for this.  Not sure if this is related to fluid/volume overload, if he has any edema in his lower extremities?  Unfortunately I am out of the office today working in the hospital, he should probably call his primary care to be evaluated or urgent care, likely needs a chest x-ray.  Not sure if he has had a fever or at risk for COVID etc, a lot of things on the list to cause cough and needs general evaluation first.  Thanks

## 2020-10-14 NOTE — Telephone Encounter (Signed)
Inbound call from pt's wife requesting a call back stating that the pt has a consistent cough and he is also vomiting. Please advise. Thanks.

## 2020-10-14 NOTE — Telephone Encounter (Signed)
Lm on vm for patient's wife to return call. 
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
## 2020-10-14 NOTE — ED Provider Notes (Signed)
Emergency Medicine Provider Triage Evaluation Note  Corey Wood , a 70 y.o. male  was evaluated in triage.  Pt complains of cough, fever, nausea, and vomiting x2 days.  Patient denies chest pain and shortness of breath.  Wife at bedside notes patient has been more confused than normal today.  Patient attended a large party over the weekend.  Patient is vaccinated against COVID-19 in addition to his booster shot.  History of esophageal varices and cirrhosis. Review of Systems  Positive: Cough, fever, nausea,vomiting Negative: CP  Physical Exam  BP (!) 129/100   Pulse 99   Temp 100.3 F (37.9 C) (Oral)   Resp 18   Ht 5\' 5"  (1.651 m)   Wt 78.5 kg   SpO2 93%   BMI 28.79 kg/m  Gen:   Awake, no distress   Resp:  Normal effort  MSK:   Moves extremities without difficulty  Other:  Pale.   Medical Decision Making  Medically screening exam initiated at 4:01 PM.  Appropriate orders placed.  Corey Wood was informed that the remainder of the evaluation will be completed by another provider, this initial triage assessment does not replace that evaluation, and the importance of remaining in the ED until their evaluation is complete.  Sepsis screening labs ordered Ammonia COVID test Patient moved to room after initial evaluation in triage.   Corey Bouchard, PA-C 10/14/20 1603    Isla Pence, MD 10/14/20 [phone]

## 2020-10-14 NOTE — Telephone Encounter (Signed)
Spoke with patient's wife, she states that yesterday patient developed a cough. She states that patient has been coughing non-stop since yesterday, he has even been coughing in his sleep. Corey Wood states that she has been giving the patient diabetic tussin for the cough but it has not helped. She states that patient has tried peppermint candy and cough drops with no relief. Corey Wood states that patient is coughing so hard he is vomiting. She states that he has not started any new meds or new foods. She states that patient does have some throat clearing after he vomits. He has not eaten this morning but he is tolerating liquids ok. They have not received the prescription for Zofran yet. Corey Wood is worried that patient will rupture something in his esophagus and she doesn't want anything to interfere with his upcoming procedure. Please advise, thanks.

## 2020-10-14 NOTE — H&P (Signed)
History and Physical   Corey Wood HKV:[id number] DOB: Jan 07, 1951 DOA: 10/14/2020  Referring MD/NP/PA: Dr. Joya Gaskins  PCP: [name], MD   Outpatient Specialists: None  Patient coming from: Home  Chief Complaint: Fever, weakness  HPI: Corey Wood is a 70 y.o. male with medical history significant of diabetes, on Lantus insulin and metformin, hypertension, hyperlipidemia, chronic kidney disease stage III, asthma, chronic hepatic cirrhosis, COPD, depression, GERD among other things who apparently attended a large gathering for his 50th wedding anniversary last week.  Since then he started feeling sick 2 days later.  Symptoms started with fever cough and generalized weakness.  He is also had some nausea vomiting without diarrhea.  Denied any significant chest pain.  Patient has been vaccinated against COVID-19 but no booster.  He has had the 2 doses.  Over the last few days he has progressively gotten weak to the point that he cannot get out of bed.  He is not eating or drinking and has lost his appetite.  Wife was worried and brought him to the emergency room.  In the ER patient was evaluated and found to have COVID-19 positive.  He was also noted to be mildly hypoxic in the ER.  Patient therefore being admitted to the hospital for further evaluation and treatment..  ED Course: Temperature is 100.3 blood pressure 99/60 pulse 99 respiratory 6 oxygen sat 89% on room air.  White count 2.7 hemoglobin 9.9 platelets 71.  Sodium 137 potassium 3.3 chloride 95 CO2 30 BUN 17 creatinine 1.69 calcium 9.1.  Lactic acid pending.  INR 1.1.  COVID-19 is positive.  Analysis negative.  Chest x-ray shows hazy right lung base opacity probably atelectasis or pneumonia.  Possible effusion.  There is possible fluid in the right minor fissure and bit left basilar scarring.  Patient is being admitted for further evaluation and treatment.  Review of Systems: As per HPI otherwise 10 point review of systems negative.     Past Medical History:  Diagnosis Date   Allergy    seasonal allergies   Anxiety    on meds   Aortic valve disorder    Arthritis    generalized (fingers)(shoulders)   Asthma    uses inhaler   Cataract    bilateral -sx    Cirrhosis (HCC)    CKD (chronic kidney disease), stage III (HCC)    only has one kidney   COPD (chronic obstructive pulmonary disease) (HCC)    Depression    on meds   DM (diabetes mellitus) ([place])    on meds   GERD (gastroesophageal reflux disease)    on meds   Heart murmur    History of colon polyps    Hx of acute pancreatitis 10/2019   Hyperlipidemia    on meds   Hypertension    on meds   Hypothyroidism    not on meds at this time   Myelodysplastic syndrome ([place])    Neuromuscular disorder ([place])    per pt   Obstructive sleep apnea    Parkinsonism ([place])    per pt report   Peripheral edema    bilateral legs   Peripheral positional vertigo     Past Surgical History:  Procedure Laterality Date   ANKLE SURGERY     CHOLECYSTECTOMY     ENDARTERECTOMY Left 04/29/2020   Procedure: CAROTID EXPLORATION removal of  central venous catheter;  Surgeon: Rosetta Posner, MD;  Location: [facility];  Service: Vascular;  Laterality: Left;  ESOPHAGEAL BANDING N/A 04/17/2020   Procedure: ESOPHAGEAL BANDING;  Surgeon: Yetta Flock, MD;  Location: [facility];  Service: Gastroenterology;  Laterality: N/A;   ESOPHAGEAL BANDING  04/29/2020   Procedure: ESOPHAGEAL BANDING;  Surgeon: Rush Landmark Telford Nab., MD;  Location: [facility];  Service: Gastroenterology;;   ESOPHAGEAL BANDING N/A 06/10/2020   Procedure: ESOPHAGEAL BANDING;  Surgeon: Yetta Flock, MD;  Location: [facility];  Service: Gastroenterology;  Laterality: N/A;   ESOPHAGOGASTRODUODENOSCOPY N/A 04/25/2020   Procedure: ESOPHAGOGASTRODUODENOSCOPY (EGD);  Surgeon: Carol Ada, MD;  Location: [facility];  Service: Endoscopy;  Laterality: N/A;   ESOPHAGOGASTRODUODENOSCOPY (EGD) WITH PROPOFOL  N/A 04/17/2020   Procedure: ESOPHAGOGASTRODUODENOSCOPY (EGD) WITH PROPOFOL;  Surgeon: Yetta Flock, MD;  Location: [facility];  Service: Gastroenterology;  Laterality: N/A;   ESOPHAGOGASTRODUODENOSCOPY (EGD) WITH PROPOFOL N/A 04/29/2020   Procedure: ESOPHAGOGASTRODUODENOSCOPY (EGD) WITH PROPOFOL;  Surgeon: Rush Landmark Telford Nab., MD;  Location: [facility];  Service: Gastroenterology;  Laterality: N/A;   ESOPHAGOGASTRODUODENOSCOPY (EGD) WITH PROPOFOL N/A 06/10/2020   Procedure: ESOPHAGOGASTRODUODENOSCOPY (EGD) WITH PROPOFOL;  Surgeon: Yetta Flock, MD;  Location: [facility];  Service: Gastroenterology;  Laterality: N/A;   EYE SURGERY     HERNIA REPAIR     IR PARACENTESIS  09/18/2020   KIDNEY STONE SURGERY     WISDOM TOOTH EXTRACTION       reports that he has never smoked. He has never used smokeless tobacco. He reports previous alcohol use. He reports previous drug use.  Allergies  Allergen Reactions   Lisinopril Cough    Family History  Problem Relation Age of Onset   Liver cancer Mother    Colon cancer Neg Hx    Stomach cancer Neg Hx      Prior to Admission medications   Medication Sig Start Date End Date Taking? Authorizing Provider  albuterol (VENTOLIN HFA) 108 (90 Base) MCG/ACT inhaler Inhale 2 puffs into the lungs daily as needed for wheezing or shortness of breath.    [provider]  ARTIFICIAL SALIVA MT Use as directed 4 sprays in the mouth or throat at bedtime.    [provider]  atorvastatin (LIPITOR) 40 MG tablet Take 40 mg by mouth at bedtime.    [provider]  brimonidine (ALPHAGAN) 0.2 % ophthalmic solution Place 1 drop into both eyes in the morning and at bedtime.    [provider]  buPROPion (WELLBUTRIN XL) 150 MG 24 hr tablet Take 150 mg by mouth daily.    [provider]  cetirizine (ZYRTEC) 10 MG tablet Chew 10 mg by mouth at bedtime.    [provider]  citalopram (CELEXA) 40 MG tablet  Take 40 mg by mouth daily.    [provider]  diclofenac Sodium (VOLTAREN) 1 % GEL Apply 2 g topically 2 (two) times daily as needed (pain).    [provider]  ferrous sulfate 325 (65 FE) MG tablet TAKE 1 TABLET (325 MG TOTAL) BY MOUTH TWO TIMES DAILY WITH A MEAL. Patient taking differently: Take 325 mg by mouth every other day. 05/05/20 05/05/21  Ghimire, Henreitta Leber, MD  furosemide (LASIX) 20 MG tablet Take 20 mg by mouth daily.    [provider]  glucose 4 GM chewable tablet Chew 1 tablet by mouth daily as needed for low blood sugar (If drop below 70).    [provider]  insulin glargine (LANTUS) 100 unit/mL SOPN Inject 18 Units into the skin daily.    [provider]  ipratropium (ATROVENT)  0.03 % nasal spray Place 2 sprays into both nostrils at bedtime.    [provider]  meclizine (ANTIVERT) 25 MG tablet Take 25 mg by mouth 2 (two) times daily as needed for dizziness.    [provider]  ondansetron (ZOFRAN ODT) 4 MG disintegrating tablet Take 1 tablet (4 mg total) by mouth every 8 (eight) hours as needed for nausea or vomiting. 10/13/20   Armbruster, Carlota Raspberry, MD  pantoprazole (PROTONIX) 40 MG tablet TAKE 1 TABLET (40 MG TOTAL) BY MOUTH TWO TIMES DAILY BEFORE A MEAL. Patient taking differently: Take 40 mg by mouth daily. 05/05/20 05/05/21  GhimireHenreitta Leber, MD  spironolactone (ALDACTONE) 50 MG tablet Take 1 tablet (50 mg total) by mouth daily. 09/18/20   Armbruster, Carlota Raspberry, MD  tamsulosin (FLOMAX) 0.4 MG CAPS capsule Take 0.4 mg by mouth 2 (two) times daily.    [provider]    Physical Exam: Vitals:   10/14/20 2000 10/14/20 2100 10/14/20 2130 10/14/20 2200  BP: 110/66 111/89 109/63 99/60  Pulse: 68 61 63 (!) 57  Resp: 17 (!) 29 (!) 27 (!) 24  Temp:      TempSrc:      SpO2: 97% 95% 94% 94%  Weight:      Height:          Constitutional: NAD, calm, comfortable Vitals:   10/14/20 2000 10/14/20 2100 10/14/20  2130 10/14/20 2200  BP: 110/66 111/89 109/63 99/60  Pulse: 68 61 63 (!) 57  Resp: 17 (!) 29 (!) 27 (!) 24  Temp:      TempSrc:      SpO2: 97% 95% 94% 94%  Weight:      Height:       Eyes: PERRL, lids and conjunctivae normal ENMT: Mucous membranes are moist. Posterior pharynx clear of any exudate or lesions.Normal dentition.  Neck: normal, supple, no masses, no thyromegaly Respiratory: clear to auscultation bilaterally, no wheezing, no crackles. Normal respiratory effort. No accessory muscle use.  Cardiovascular: Regular rate and rhythm, no murmurs / rubs / gallops. No extremity edema. 2+ pedal pulses. No carotid bruits.  Abdomen: no tenderness, no masses palpated. No hepatosplenomegaly. Bowel sounds positive.  Musculoskeletal: no clubbing / cyanosis. No joint deformity upper and lower extremities. Good ROM, no contractures. Normal muscle tone.  Skin: no rashes, lesions, ulcers. No induration Neurologic: CN 2-12 grossly intact. Sensation intact, DTR normal. Strength 5/5 in all 4.  Psychiatric: Normal judgment and insight. Alert and oriented x 3. Normal mood.     Labs on Admission: I have personally reviewed following labs and imaging studies  CBC: Recent Labs  Lab 10/14/20 1622  WBC 2.7*  NEUTROABS 1.7  HGB 9.9*  HCT 30.5*  MCV 90.0  PLT 71*   Basic Metabolic Panel: Recent Labs  Lab 10/14/20 1622  NA 137  K 3.3*  CL 95*  CO2 30  GLUCOSE 120*  BUN 17  CREATININE 1.61*  CALCIUM 9.1   GFR: Estimated Creatinine Clearance: 40.1 mL/min (A) (by C-G formula based on SCr of 1.61 mg/dL (H)). Liver Function Tests: Recent Labs  Lab 10/14/20 1622  AST 28  ALT 17  ALKPHOS 89  BILITOT 0.8  PROT 8.0  ALBUMIN 3.5   No results for input(s): LIPASE, AMYLASE in the last 168 hours. Recent Labs  Lab 10/14/20 1622  AMMONIA 40*   Coagulation Profile: Recent Labs  Lab 10/14/20 1622  INR 1.1   Cardiac Enzymes: No results for input(s): CKTOTAL, CKMB, CKMBINDEX,  TROPONINI in the last 168 hours. BNP (last 3 results) No results for input(s): PROBNP in the last 8760 hours. HbA1C: No results for input(s): HGBA1C in the last 72 hours. CBG: No results for input(s): GLUCAP in the last 168 hours. Lipid Profile: No results for input(s): CHOL, HDL, LDLCALC, TRIG, CHOLHDL, LDLDIRECT in the last 72 hours. Thyroid Function Tests: No results for input(s): TSH, T4TOTAL, FREET4, T3FREE, THYROIDAB in the last 72 hours. Anemia Panel: No results for input(s): VITAMINB12, FOLATE, FERRITIN, TIBC, IRON, RETICCTPCT in the last 72 hours. Urine analysis:    Component Value Date/Time   COLORURINE YELLOW 10/14/2020 1720   APPEARANCEUR CLEAR 10/14/2020 1720   LABSPEC 1.014 10/14/2020 1720   PHURINE 5.0 10/14/2020 1720   GLUCOSEU NEGATIVE 10/14/2020 1720   HGBUR SMALL (A) 10/14/2020 1720   BILIRUBINUR NEGATIVE 10/14/2020 1720   KETONESUR NEGATIVE 10/14/2020 1720   PROTEINUR NEGATIVE 10/14/2020 1720   NITRITE NEGATIVE 10/14/2020 1720   LEUKOCYTESUR NEGATIVE 10/14/2020 1720   Sepsis Labs: @LABRCNTIP (procalcitonin:4,lacticidven:4) ) Recent Results (from the past 240 hour(s))  Resp Panel by RT-PCR (Flu A&B, Covid) Nasopharyngeal Swab     Status: Abnormal   Collection Time: 10/14/20  4:25 PM   Specimen: Nasopharyngeal Swab; Nasopharyngeal(NP) swabs in vial transport medium  Result Value Ref Range Status   SARS Coronavirus 2 by RT PCR POSITIVE (A) NEGATIVE Final    Comment: RESULT CALLED TO, READ BACK BY AND VERIFIED WITH: SONYA, RN @ 2028 ON 10/14/20 C VARNER (NOTE) SARS-CoV-2 target nucleic acids are DETECTED.  The SARS-CoV-2 RNA is generally detectable in upper respiratory specimens during the acute phase of infection. Positive results are indicative of the presence of the identified virus, but do not rule out bacterial infection or co-infection with other pathogens not detected by the test. Clinical correlation with patient history and other diagnostic  information is necessary to determine patient infection status. The expected result is Negative.  Fact Sheet for Patients: EntrepreneurPulse.com.au  Fact Sheet for Healthcare Providers: IncredibleEmployment.be  This test is not yet approved or cleared by the [place] FDA and  has been authorized for detection and/or diagnosis of SARS-CoV-2 by FDA under an Emergency Use Authorization (EUA).  This EUA will remain in effect (meaning this test can  be used) for the duration of  the COVID-19 declaration under Section 564(b)(1) of the Act, 21 U.S.C. section 360bbb-3(b)(1), unless the authorization is terminated or revoked sooner.     Influenza A by PCR NEGATIVE NEGATIVE Final   Influenza B by PCR NEGATIVE NEGATIVE Final    Comment: (NOTE) The Xpert Xpress SARS-CoV-2/FLU/RSV plus assay is intended as an aid in the diagnosis of influenza from Nasopharyngeal swab specimens and should not be used as a sole basis for treatment. Nasal washings and aspirates are unacceptable for Xpert Xpress SARS-CoV-2/FLU/RSV testing.  Fact Sheet for Patients: EntrepreneurPulse.com.au  Fact Sheet for Healthcare Providers: IncredibleEmployment.be  This test is not yet approved or cleared by the [place] FDA and has been authorized for detection and/or diagnosis of SARS-CoV-2 by FDA under an Emergency Use Authorization (EUA). This EUA will remain in effect (meaning this test can be used) for the duration of the COVID-19 declaration under Section 564(b)(1) of the Act, 21 U.S.C. section 360bbb-3(b)(1), unless the authorization is terminated or revoked.  Performed at [facility], [place] [address], [place], [place] [address]      Radiological Exams on Admission: DG Chest Port 1 View  Result Date: 10/14/2020 CLINICAL DATA:  Questionable sepsis -  evaluate for abnormality Cough and fever.  Weakness. EXAM:  PORTABLE CHEST 1 VIEW COMPARISON:  Radiograph 08/28/2020 FINDINGS: Stable heart size and mediastinal contours, heart size upper normal. Unchanged blunting of left costophrenic angle with basilar scarring and small pleural effusion. Similar blunting of right costophrenic angle. There may be a fluid in the right minor fissure. Mild hazy right lung base opacity. No pneumothorax. No pulmonary edema. No acute osseous abnormalities are seen. IMPRESSION: 1. Hazy right lung base opacity may be atelectasis, pneumonia, or layering effusion. 2. Possible fluid in the right minor fissure. Stable blunting of the right costophrenic angle, possible effusion. 3. Left basilar scarring and small left pleural effusion are similar. Electronically Signed   By: Keith Rake M.D.   On: 10/14/2020 16:49      Assessment/Plan Principal Problem:   COVID-19 Active Problems:   Type 2 diabetes mellitus without complication, without long-term current use of insulin (HCC)   Hepatic cirrhosis (HCC)   AKI (acute kidney injury) ([place])   Pancytopenia ([place])   Hypokalemia     #1 COVID-19 infection with hypoxia: Patient will be admitted and initiated on COVID-19 protocol.  Remdesivir IV, and dexamethasone.  Further treatment will be determined by patient's response.  Continue supportive care and oxygen.  Mobilize with PT OT prior to discharge.  #2 non-insulin-dependent diabetes: Continue with sliding scale insulin.  May be a good candidate for Tradjenta.  Hold metformin and continue Lantus when confirmed  #3 essential hypertension: We will resume home regimen once med rec is finished.  #4 history of hepatic cirrhosis: No significant ascites.  Monitor closely.  #5 pancytopenia: Most likely due to liver cirrhosis complicated by his [id number] infection.  Continue close monitoring  #6 AKI: Monitor renal function.  Gentle hydration especially with his cirrhosis and COVID-19 infection.  #7 hypokalemia: Replete potassium.  #8  depression with anxiety, has been on Celexa.  Continue.  Also Wellbutrin  #9 hyperlipidemia: Continue atorvastatin   DVT prophylaxis: SCD as patient has high risk of bleeding Code Status: Full code Family Communication: No family at bedside Disposition Plan: To be determined Consults called: None Admission status: Inpatient  Severity of Illness: The appropriate patient status for this patient is INPATIENT. Inpatient status is judged to be reasonable and necessary in order to provide the required intensity of service to ensure the patient's safety. The patient's presenting symptoms, physical exam findings, and initial radiographic and laboratory data in the context of their chronic comorbidities is felt to place them at high risk for further clinical deterioration. Furthermore, it is not anticipated that the patient will be medically stable for discharge from the hospital within 2 midnights of admission. The following factors support the patient status of inpatient.   " The patient's presenting symptoms include fever cough and weakness. " The worrisome physical exam findings include generalized weakness and fatigue. " The initial radiographic and laboratory data are worrisome because of COVID-19 positive. " The chronic co-morbidities include hypertension.   * I certify that at the point of admission it is my clinical judgment that the patient will require inpatient hospital care spanning beyond 2 midnights from the point of admission due to high intensity of service, high risk for further deterioration and high frequency of surveillance required.Barbette Merino MD [facility] Pager [phone]  If 7PM-7AM, please contact night-coverage [email or web address] Password [facility]  10/14/2020, 10:04 PM

## 2020-10-14 NOTE — ED Triage Notes (Addendum)
Patient BIB POV c/o fever, cough, and N/V X2 days.  Max fever 99.9 today before coming to the ER.  Patient is vaccinated and has had the booster.  Patient has been unable to do his own ADLs and has been having to have help from wife.  Patient also has esophogeal varices and is to have surgery for those on Tuesday 10/21/2020, he's had 5 previous surgeries for this.  Wife is concerned about those rupturing from his coughing as he has had that happen before.

## 2020-10-15 ENCOUNTER — Telehealth: Payer: Self-pay | Admitting: Gastroenterology

## 2020-10-15 ENCOUNTER — Encounter (HOSPITAL_COMMUNITY): Payer: Self-pay | Admitting: Gastroenterology

## 2020-10-15 ENCOUNTER — Other Ambulatory Visit: Payer: Self-pay

## 2020-10-15 DIAGNOSIS — U071 COVID-19: Secondary | ICD-10-CM | POA: Diagnosis not present

## 2020-10-15 LAB — CBC WITH DIFFERENTIAL/PLATELET
Abs Immature Granulocytes: 0.03 10*3/uL (ref 0.00–0.07)
Basophils Absolute: 0 10*3/uL (ref 0.0–0.1)
Basophils Relative: 1 %
Eosinophils Absolute: 0 10*3/uL (ref 0.0–0.5)
Eosinophils Relative: 2 %
HCT: 29.6 % — ABNORMAL LOW (ref 39.0–52.0)
Hemoglobin: 9.6 g/dL — ABNORMAL LOW (ref 13.0–17.0)
Immature Granulocytes: 2 %
Lymphocytes Relative: 19 %
Lymphs Abs: 0.3 10*3/uL — ABNORMAL LOW (ref 0.7–4.0)
MCH: 29.2 pg (ref 26.0–34.0)
MCHC: 32.4 g/dL (ref 30.0–36.0)
MCV: 90 fL (ref 80.0–100.0)
Monocytes Absolute: 0.1 10*3/uL (ref 0.1–1.0)
Monocytes Relative: 5 %
Neutro Abs: 1.2 10*3/uL — ABNORMAL LOW (ref 1.7–7.7)
Neutrophils Relative %: 71 %
Platelets: 67 10*3/uL — ABNORMAL LOW (ref 150–400)
RBC: 3.29 MIL/uL — ABNORMAL LOW (ref 4.22–5.81)
RDW: 15.9 % — ABNORMAL HIGH (ref 11.5–15.5)
WBC: 1.7 10*3/uL — ABNORMAL LOW (ref 4.0–10.5)
nRBC: 0 % (ref 0.0–0.2)

## 2020-10-15 LAB — COMPREHENSIVE METABOLIC PANEL
ALT: 17 U/L (ref 0–44)
AST: 32 U/L (ref 15–41)
Albumin: 3.1 g/dL — ABNORMAL LOW (ref 3.5–5.0)
Alkaline Phosphatase: 77 U/L (ref 38–126)
Anion gap: 11 (ref 5–15)
BUN: 18 mg/dL (ref 8–23)
CO2: 29 mmol/L (ref 22–32)
Calcium: 8.7 mg/dL — ABNORMAL LOW (ref 8.9–10.3)
Chloride: 99 mmol/L (ref 98–111)
Creatinine, Ser: 1.5 mg/dL — ABNORMAL HIGH (ref 0.61–1.24)
GFR, Estimated: 49 mL/min — ABNORMAL LOW (ref >60)
Glucose, Bld: 148 mg/dL — ABNORMAL HIGH (ref 70–99)
Potassium: 3.7 mmol/L (ref 3.5–5.1)
Sodium: 139 mmol/L (ref 135–145)
Total Bilirubin: 0.9 mg/dL (ref 0.3–1.2)
Total Protein: 7.1 g/dL (ref 6.5–8.1)

## 2020-10-15 LAB — GLUCOSE, CAPILLARY
Glucose-Capillary: 213 mg/dL — ABNORMAL HIGH (ref 70–99)
Glucose-Capillary: 191 mg/dL — ABNORMAL HIGH (ref 70–99)

## 2020-10-15 LAB — FERRITIN: Ferritin: 306 ng/mL (ref 24–336)

## 2020-10-15 LAB — CBG MONITORING, ED
Glucose-Capillary: 182 mg/dL — ABNORMAL HIGH (ref 70–99)
Glucose-Capillary: 235 mg/dL — ABNORMAL HIGH (ref 70–99)
Glucose-Capillary: 87 mg/dL (ref 70–99)

## 2020-10-15 LAB — C-REACTIVE PROTEIN: CRP: 5.7 mg/dL — ABNORMAL HIGH (ref <1.0)

## 2020-10-15 LAB — D-DIMER, QUANTITATIVE: D-Dimer, Quant: 2.87 ug/mL-FEU — ABNORMAL HIGH (ref 0.00–0.50)

## 2020-10-15 LAB — HEMOGLOBIN A1C
Hgb A1c MFr Bld: 7.2 % — ABNORMAL HIGH (ref 4.8–5.6)
Mean Plasma Glucose: 159.94 mg/dL

## 2020-10-15 MED ORDER — ONDANSETRON HCL 4 MG/2ML IJ SOLN: 4.0000 mg | Freq: Four times a day (QID) | INTRAMUSCULAR | Status: DC | PRN

## 2020-10-15 MED ORDER — TRAZODONE HCL 50 MG PO TABS
75.0000 mg | ORAL_TABLET | Freq: Every day | ORAL | Status: DC
  Administered 2020-10-15: 75 mg via ORAL
  Filled 2020-10-15: qty 2

## 2020-10-15 MED ORDER — MONTELUKAST SODIUM 10 MG PO TABS
10.0000 mg | ORAL_TABLET | Freq: Every day | ORAL | Status: DC
  Administered 2020-10-15 – 2020-10-16 (×2): 10 mg via ORAL
  Filled 2020-10-15 (×2): qty 1

## 2020-10-15 MED ORDER — SODIUM CHLORIDE 0.9 % IV SOLN: 100.0000 mg | Freq: Every day | INTRAVENOUS | Status: DC

## 2020-10-15 MED ORDER — ONDANSETRON HCL 4 MG PO TABS: 4.0000 mg | ORAL_TABLET | Freq: Four times a day (QID) | ORAL | Status: DC | PRN

## 2020-10-15 MED ORDER — LATANOPROST 0.005 % OP SOLN
1.0000 [drp] | Freq: Every day | OPHTHALMIC | Status: DC
  Administered 2020-10-15: 1 [drp] via OPHTHALMIC
  Filled 2020-10-15: qty 2.5

## 2020-10-15 MED ORDER — PANTOPRAZOLE SODIUM 40 MG PO TBEC
40.0000 mg | DELAYED_RELEASE_TABLET | Freq: Every day | ORAL | Status: DC
  Administered 2020-10-15 – 2020-10-16 (×2): 40 mg via ORAL
  Filled 2020-10-15 (×2): qty 1

## 2020-10-15 MED ORDER — INSULIN ASPART 100 UNIT/ML IJ SOLN
0.0000 [IU] | Freq: Every day | INTRAMUSCULAR | Status: DC
  Filled 2020-10-15: qty 0.05

## 2020-10-15 MED ORDER — TAMSULOSIN HCL 0.4 MG PO CAPS
0.4000 mg | ORAL_CAPSULE | Freq: Two times a day (BID) | ORAL | Status: DC
  Administered 2020-10-15 – 2020-10-16 (×2): 0.4 mg via ORAL
  Filled 2020-10-15 (×2): qty 1

## 2020-10-15 MED ORDER — INSULIN ASPART 100 UNIT/ML IJ SOLN
0.0000 [IU] | Freq: Three times a day (TID) | INTRAMUSCULAR | Status: DC
  Administered 2020-10-15: 3 [IU] via SUBCUTANEOUS
  Administered 2020-10-15 – 2020-10-16 (×3): 5 [IU] via SUBCUTANEOUS
  Filled 2020-10-15: qty 0.15

## 2020-10-15 MED ORDER — DEXAMETHASONE SODIUM PHOSPHATE 10 MG/ML IJ SOLN
6.0000 mg | INTRAMUSCULAR | Status: DC
  Administered 2020-10-15 – 2020-10-16 (×2): 6 mg via INTRAVENOUS
  Filled 2020-10-15 (×2): qty 1

## 2020-10-15 MED ORDER — IPRATROPIUM BROMIDE 0.03 % NA SOLN
1.0000 | Freq: Every day | NASAL | Status: DC
  Administered 2020-10-15: 1 via NASAL
  Filled 2020-10-15: qty 30

## 2020-10-15 MED ORDER — CITALOPRAM HYDROBROMIDE 20 MG PO TABS
40.0000 mg | ORAL_TABLET | Freq: Every day | ORAL | Status: DC
  Administered 2020-10-15 – 2020-10-16 (×2): 40 mg via ORAL
  Filled 2020-10-15 (×2): qty 2

## 2020-10-15 MED ORDER — BUPROPION HCL ER (XL) 150 MG PO TB24
150.0000 mg | ORAL_TABLET | Freq: Every day | ORAL | Status: DC
  Administered 2020-10-15 – 2020-10-16 (×2): 150 mg via ORAL
  Filled 2020-10-15 (×2): qty 1

## 2020-10-15 MED ORDER — LORATADINE 10 MG PO TABS
10.0000 mg | ORAL_TABLET | Freq: Every day | ORAL | Status: DC
  Administered 2020-10-15 – 2020-10-16 (×2): 10 mg via ORAL
  Filled 2020-10-15 (×2): qty 1

## 2020-10-15 MED ORDER — SODIUM CHLORIDE 0.9 % IV SOLN: 200.0000 mg | Freq: Once | INTRAVENOUS | Status: DC

## 2020-10-15 MED ORDER — ATORVASTATIN CALCIUM 40 MG PO TABS
40.0000 mg | ORAL_TABLET | Freq: Every day | ORAL | Status: DC
  Administered 2020-10-15: 40 mg via ORAL
  Filled 2020-10-15: qty 1

## 2020-10-15 MED ORDER — INSULIN GLARGINE 100 UNIT/ML ~~LOC~~ SOLN
16.0000 [IU] | Freq: Every day | SUBCUTANEOUS | Status: DC
  Administered 2020-10-15: 16 [IU] via SUBCUTANEOUS
  Filled 2020-10-15 (×2): qty 0.16

## 2020-10-15 NOTE — Progress Notes (Signed)
PROGRESS NOTE    Corey Wood  ZYY:[id number] DOB: 03-Dec-1950 DOA: 10/14/2020 PCP: [name], MD    Brief Narrative:  70 year old gentleman with history of type 2 diabetes on insulin, hypertension, hyperlipidemia, CKD stage IIIa, chronic hepatic cirrhosis and esophageal varices, COPD, depression, GERD who presents to the emergency room with nausea, vomiting, feeling sick and dehydrated after celebrating a big gathering of his 50th wedding anniversary last week.  In the emergency room temperature 100.3, blood pressure stable.  89% on room air.  Pancytopenic.  Creatinine 1.69.  Lactic acid normal.  COVID-19 positive.  Chest x-ray with hazy right lung base opacity consistent with atelectasis.  Admitted with significant symptoms and hypoxia initially but ultimately improved.   Assessment & Plan:   Principal Problem:   COVID-19 Active Problems:   Type 2 diabetes mellitus without complication, without long-term current use of insulin (HCC)   Hepatic cirrhosis (HCC)   AKI (acute kidney injury) ([place])   Pancytopenia (HCC)   Hypokalemia  COVID-19 virus infection with hypoxia: Continue to monitor due to significant symptoms today in the hospital. chest physiotherapy, incentive spirometry, deep breathing exercises, sputum induction, mucolytic's and bronchodilators. Supplemental oxygen to keep saturations more than 90%. Covid directed therapy with , steroids, dexamethasone 6 mg daily. remdesivir, day 2/3. antibiotics, not indicated Due to severity of symptoms, patient will need daily inflammatory markers, chest x-rays, liver function test to monitor and direct COVID-19 therapies.  Insulin-dependent diabetes: Resume home doses of insulin as he is eating full meal now.  Fairly stable.  Hepatic cirrhosis with esophageal varices: Scheduled for banding and EGD on 7/26.  Fairly stable, however may benefit with endoscopic evaluation in 2 weeks.  Hypokalemia: Replaced.  Monitor  levels.  Depression and anxiety: On Celexa and Wellbutrin that is continued.  Pancytopenia: Mostly chronic.  DVT prophylaxis: SCDs Start: 10/15/20 0012   Code Status: Full code Family Communication: Wife on the phone Disposition Plan: Status is: Inpatient  Remains inpatient appropriate because:Inpatient level of care appropriate due to severity of illness  Dispo: The patient is from: Home              Anticipated d/c is to: Home              Patient currently is not medically stable to d/c.   Difficult to place patient No         Consultants:  None  Procedures:  None  Antimicrobials:  Remdesivir 7/19---   Subjective: Patient was seen and examined.  Was in the emergency room.  Afebrile overnight.  He had some cough overnight but denies any complaints at this time.  Denies any nausea vomiting or diarrhea.  Hungry and able to eat.  On room air.  Objective: Vitals:   10/15/20 0630 10/15/20 0700 10/15/20 1000 10/15/20 1200  BP: 119/62 111/78 129/76 125/66  Pulse: 68 62 77 60  Resp: 20 17 (!) 23 (!) 25  Temp:  98 F (36.7 C)  98.2 F (36.8 C)  TempSrc:  Oral  Oral  SpO2: 96% 94% 94% 94%  Weight:      Height:        Intake/Output Summary (Last 24 hours) at 10/15/2020 1349 Last data filed at 10/15/2020 1344 Gross per 24 hour  Intake 100 ml  Output 800 ml  Net -700 ml   Filed Weights   10/14/20 1522  Weight: 78.5 kg    Examination:  General exam: Appears calm and comfortable  Room air at rest.  Not mobilized yet. Respiratory system: Bilateral clear.  No added sounds. Cardiovascular system: S1 & S2 heard, RRR.  No leg swelling or edema.   Gastrointestinal system: Soft and nontender.   Central nervous system: Alert and oriented. No focal neurological deficits. Extremities: Symmetric 5 x 5 power. Skin: No rashes, lesions or ulcers Psychiatry: Judgement and insight appear normal. Mood & affect appropriate.     Data Reviewed: I have personally reviewed  following labs and imaging studies  CBC: Recent Labs  Lab 10/14/20 1622 10/15/20 0447  WBC 2.7* 1.7*  NEUTROABS 1.7 1.2*  HGB 9.9* 9.6*  HCT 30.5* 29.6*  MCV 90.0 90.0  PLT 71* 67*   Basic Metabolic Panel: Recent Labs  Lab 10/14/20 1622 10/15/20 0447  NA 137 139  K 3.3* 3.7  CL 95* 99  CO2 30 29  GLUCOSE 120* 148*  BUN 17 18  CREATININE 1.61* 1.50*  CALCIUM 9.1 8.7*   GFR: Estimated Creatinine Clearance: 43 mL/min (A) (by C-G formula based on SCr of 1.5 mg/dL (H)). Liver Function Tests: Recent Labs  Lab 10/14/20 1622 10/15/20 0447  AST 28 32  ALT 17 17  ALKPHOS 89 77  BILITOT 0.8 0.9  PROT 8.0 7.1  ALBUMIN 3.5 3.1*   No results for input(s): LIPASE, AMYLASE in the last 168 hours. Recent Labs  Lab 10/14/20 1622  AMMONIA 40*   Coagulation Profile: Recent Labs  Lab 10/14/20 1622  INR 1.1   Cardiac Enzymes: No results for input(s): CKTOTAL, CKMB, CKMBINDEX, TROPONINI in the last 168 hours. BNP (last 3 results) No results for input(s): PROBNP in the last 8760 hours. HbA1C: Recent Labs    10/15/20 0447  HGBA1C 7.2*   CBG: Recent Labs  Lab 10/15/20 0016 10/15/20 0730 10/15/20 1220  GLUCAP 87 182* 235*   Lipid Profile: No results for input(s): CHOL, HDL, LDLCALC, TRIG, CHOLHDL, LDLDIRECT in the last 72 hours. Thyroid Function Tests: No results for input(s): TSH, T4TOTAL, FREET4, T3FREE, THYROIDAB in the last 72 hours. Anemia Panel: Recent Labs    10/15/20 0447  FERRITIN 306   Sepsis Labs: Recent Labs  Lab 10/14/20 1622 10/14/20 1915  LATICACIDVEN 1.3 0.8    Recent Results (from the past 240 hour(s))  Blood culture (routine single)     Status: None (Preliminary result)   Collection Time: 10/14/20  4:24 PM   Specimen: BLOOD  Result Value Ref Range Status   Specimen Description   Final    BLOOD LEFT ANTECUBITAL Performed at [facility], [place] [address] [place], [place] [address]    Special Requests   Final     BOTTLES DRAWN AEROBIC AND ANAEROBIC Blood Culture results may not be optimal due to an inadequate volume of blood received in culture bottles Performed at [place] [address], [place], [place] [address]    Culture   Final    NO GROWTH < 12 HOURS Performed at [place] [address] [place], [place] [address]    Report Status PENDING  Incomplete  Resp Panel by RT-PCR (Flu A&B, Covid) Nasopharyngeal Swab     Status: Abnormal   Collection Time: 10/14/20  4:25 PM   Specimen: Nasopharyngeal Swab; Nasopharyngeal(NP) swabs in vial transport medium  Result Value Ref Range Status   SARS Coronavirus 2 by RT PCR POSITIVE (A) NEGATIVE Final    Comment: RESULT CALLED TO, READ BACK BY AND VERIFIED WITH: SONYA, RN @ 2028 ON 10/14/20 C VARNER (NOTE) SARS-CoV-2  target nucleic acids are DETECTED.  The SARS-CoV-2 RNA is generally detectable in upper respiratory specimens during the acute phase of infection. Positive results are indicative of the presence of the identified virus, but do not rule out bacterial infection or co-infection with other pathogens not detected by the test. Clinical correlation with patient history and other diagnostic information is necessary to determine patient infection status. The expected result is Negative.  Fact Sheet for Patients: EntrepreneurPulse.com.au  Fact Sheet for Healthcare Providers: IncredibleEmployment.be  This test is not yet approved or cleared by the [place] FDA and  has been authorized for detection and/or diagnosis of SARS-CoV-2 by FDA under an Emergency Use Authorization (EUA).  This EUA will remain in effect (meaning this test can  be used) for the duration of  the COVID-19 declaration under Section 564(b)(1) of the Act, 21 U.S.C. section 360bbb-3(b)(1), unless the authorization is terminated or revoked sooner.     Influenza A by PCR NEGATIVE NEGATIVE Final    Influenza B by PCR NEGATIVE NEGATIVE Final    Comment: (NOTE) The Xpert Xpress SARS-CoV-2/FLU/RSV plus assay is intended as an aid in the diagnosis of influenza from Nasopharyngeal swab specimens and should not be used as a sole basis for treatment. Nasal washings and aspirates are unacceptable for Xpert Xpress SARS-CoV-2/FLU/RSV testing.  Fact Sheet for Patients: EntrepreneurPulse.com.au  Fact Sheet for Healthcare Providers: IncredibleEmployment.be  This test is not yet approved or cleared by the [place] FDA and has been authorized for detection and/or diagnosis of SARS-CoV-2 by FDA under an Emergency Use Authorization (EUA). This EUA will remain in effect (meaning this test can be used) for the duration of the COVID-19 declaration under Section 564(b)(1) of the Act, 21 U.S.C. section 360bbb-3(b)(1), unless the authorization is terminated or revoked.  Performed at [facility], [place] [address], [place], [place] [address]          Radiology Studies: [facility] Chest Port 1 View  Result Date: 10/14/2020 CLINICAL DATA:  Questionable sepsis - evaluate for abnormality Cough and fever.  Weakness. EXAM: PORTABLE CHEST 1 VIEW COMPARISON:  Radiograph 08/28/2020 FINDINGS: Stable heart size and mediastinal contours, heart size upper normal. Unchanged blunting of left costophrenic angle with basilar scarring and small pleural effusion. Similar blunting of right costophrenic angle. There may be a fluid in the right minor fissure. Mild hazy right lung base opacity. No pneumothorax. No pulmonary edema. No acute osseous abnormalities are seen. IMPRESSION: 1. Hazy right lung base opacity may be atelectasis, pneumonia, or layering effusion. 2. Possible fluid in the right minor fissure. Stable blunting of the right costophrenic angle, possible effusion. 3. Left basilar scarring and small left pleural effusion are similar. Electronically Signed   By:  Keith Rake M.D.   On: 10/14/2020 16:49        Scheduled Meds:  atorvastatin  40 mg Oral QHS   buPROPion  150 mg Oral Daily   citalopram  40 mg Oral Daily   dexamethasone (DECADRON) injection  6 mg Intravenous Q24H   insulin aspart  0-15 Units Subcutaneous TID WC   insulin aspart  0-5 Units Subcutaneous QHS   insulin glargine  16 Units Subcutaneous Daily   ipratropium  1 spray Each Nare QHS   latanoprost  1 drop Both Eyes QHS   loratadine  10 mg Oral Daily   montelukast  10 mg Oral Daily   pantoprazole  40 mg Oral Daily   tamsulosin  0.4 mg Oral BID   traZODone  75 mg Oral QHS   Continuous Infusions:  remdesivir 100 mg in NS 100 mL Stopped (10/15/20 1018)     LOS: 1 day    Time spent: 30 minutes    Barb Merino, MD [facility] Pager [phone]

## 2020-10-15 NOTE — Telephone Encounter (Signed)
Inbound call from patient's wife wanting [place] to be aware that was admitted to [facility] ED yesterday and is currently still there.

## 2020-10-15 NOTE — ED Notes (Signed)
Wife updated on patient status and plan of care.

## 2020-10-15 NOTE — ED Notes (Signed)
Male purewick leaking, pt linen changed and purewick changed. Pt resting in bed quietly at present

## 2020-10-15 NOTE — Telephone Encounter (Signed)
Yes am aware. He was admitted with COVID, respiratory issues, managed by hospitalist. If they need our help they will contact us but I don't think it is a GI issue. He has an EGD scheduled with me next week at the hospital which will need to be cancelled and rescheduled, I have been in touch with Jan about it already. Thanks

## 2020-10-16 ENCOUNTER — Other Ambulatory Visit: Payer: Self-pay

## 2020-10-16 DIAGNOSIS — U071 COVID-19: Secondary | ICD-10-CM | POA: Diagnosis not present

## 2020-10-16 DIAGNOSIS — R188 Other ascites: Secondary | ICD-10-CM

## 2020-10-16 DIAGNOSIS — I8510 Secondary esophageal varices without bleeding: Secondary | ICD-10-CM

## 2020-10-16 DIAGNOSIS — K7460 Unspecified cirrhosis of liver: Secondary | ICD-10-CM

## 2020-10-16 LAB — CBC WITH DIFFERENTIAL/PLATELET
Abs Immature Granulocytes: 0.01 10*3/uL (ref 0.00–0.07)
Basophils Absolute: 0 10*3/uL (ref 0.0–0.1)
Basophils Relative: 0 %
Eosinophils Absolute: 0 10*3/uL (ref 0.0–0.5)
Eosinophils Relative: 1 %
HCT: 30.1 % — ABNORMAL LOW (ref 39.0–52.0)
Hemoglobin: 9.7 g/dL — ABNORMAL LOW (ref 13.0–17.0)
Immature Granulocytes: 1 %
Lymphocytes Relative: 25 %
Lymphs Abs: 0.4 10*3/uL — ABNORMAL LOW (ref 0.7–4.0)
MCH: 29.1 pg (ref 26.0–34.0)
MCHC: 32.2 g/dL (ref 30.0–36.0)
MCV: 90.4 fL (ref 80.0–100.0)
Monocytes Absolute: 0.1 10*3/uL (ref 0.1–1.0)
Monocytes Relative: 5 %
Neutro Abs: 1.1 10*3/uL — ABNORMAL LOW (ref 1.7–7.7)
Neutrophils Relative %: 68 %
Platelets: 66 10*3/uL — ABNORMAL LOW (ref 150–400)
RBC: 3.33 MIL/uL — ABNORMAL LOW (ref 4.22–5.81)
RDW: 15.6 % — ABNORMAL HIGH (ref 11.5–15.5)
WBC: 1.7 10*3/uL — ABNORMAL LOW (ref 4.0–10.5)
nRBC: 0 % (ref 0.0–0.2)

## 2020-10-16 LAB — FERRITIN: Ferritin: 390 ng/mL — ABNORMAL HIGH (ref 24–336)

## 2020-10-16 LAB — COMPREHENSIVE METABOLIC PANEL
ALT: 18 U/L (ref 0–44)
AST: 33 U/L (ref 15–41)
Albumin: 3.1 g/dL — ABNORMAL LOW (ref 3.5–5.0)
Alkaline Phosphatase: 78 U/L (ref 38–126)
Anion gap: 11 (ref 5–15)
BUN: 26 mg/dL — ABNORMAL HIGH (ref 8–23)
CO2: 27 mmol/L (ref 22–32)
Calcium: 9.2 mg/dL (ref 8.9–10.3)
Chloride: 99 mmol/L (ref 98–111)
Creatinine, Ser: 1.23 mg/dL (ref 0.61–1.24)
GFR, Estimated: >60 mL/min (ref >60)
Glucose, Bld: 180 mg/dL — ABNORMAL HIGH (ref 70–99)
Potassium: 3.8 mmol/L (ref 3.5–5.1)
Sodium: 137 mmol/L (ref 135–145)
Total Bilirubin: 0.6 mg/dL (ref 0.3–1.2)
Total Protein: 7.2 g/dL (ref 6.5–8.1)

## 2020-10-16 LAB — URINE CULTURE: Culture: NO GROWTH

## 2020-10-16 LAB — C-REACTIVE PROTEIN: CRP: 4.9 mg/dL — ABNORMAL HIGH (ref <1.0)

## 2020-10-16 LAB — GLUCOSE, CAPILLARY
Glucose-Capillary: 212 mg/dL — ABNORMAL HIGH (ref 70–99)
Glucose-Capillary: 245 mg/dL — ABNORMAL HIGH (ref 70–99)

## 2020-10-16 LAB — D-DIMER, QUANTITATIVE: D-Dimer, Quant: 2.38 ug/mL-FEU — ABNORMAL HIGH (ref 0.00–0.50)

## 2020-10-16 MED ORDER — POLYETHYLENE GLYCOL 3350 17 G PO PACK
17.0000 g | PACK | Freq: Every day | ORAL | Status: DC
  Administered 2020-10-16: 17 g via ORAL
  Filled 2020-10-16: qty 1

## 2020-10-16 MED ORDER — INSULIN GLARGINE 100 UNIT/ML ~~LOC~~ SOLN
21.0000 [IU] | Freq: Every day | SUBCUTANEOUS | Status: DC
  Administered 2020-10-16: 21 [IU] via SUBCUTANEOUS
  Filled 2020-10-16: qty 0.21

## 2020-10-16 MED ORDER — DEXAMETHASONE 6 MG PO TABS: 6.0000 mg | ORAL_TABLET | Freq: Every day | ORAL | 0 refills | Status: DC

## 2020-10-16 MED ORDER — DEXAMETHASONE 6 MG PO TABS: 6.0000 mg | ORAL_TABLET | Freq: Every day | ORAL | 0 refills | Status: AC

## 2020-10-16 MED ORDER — SODIUM CHLORIDE 0.9 % IV SOLN
100.0000 mg | Freq: Once | INTRAVENOUS | Status: AC
  Administered 2020-10-16: 100 mg via INTRAVENOUS
  Filled 2020-10-16: qty 20

## 2020-10-16 NOTE — Telephone Encounter (Signed)
Patient's wife came to the office. She is concerned about "traces of blood that keep showing up" and she is concerned it is coming from his esophagus. I confirmed that we cancelled his EGD scheduled for next week due to him having Covid and being Admitted.  She indicated that he has been discharged and is doing well. Wanted to know if Dr. Havery Moros would review his chart and advise on how soon he could be scheduled for EGD with Screening of varices.  I let her know that we may be able to do his procedure on Monday,  August 15th if Dr. Havery Moros feels that is appropriate. We are still waiting on clearance from Dr. Marjo Bicker from the [facility] and I have asked patient's wife to call the [place] and see if she can encourage a response to that request. She let me know they have an appointment with Dr. Marjo Bicker next week and will definitely address that request.

## 2020-10-16 NOTE — TOC Transition Note (Signed)
Transition of Care [facility]) - CM/SW Discharge Note   Patient Details  Name: Corey Wood MRN: [id number] Date of Birth: July 23, 1950  [facility] CM/SW Contact:  Ross Ludwig, LCSW Phone Number: 10/16/2020, 10:43 AM   Clinical Narrative:     Patient is a 70 year old male who is alert and oriented x4.  Patient is currently from home and lives with his wife.  Patient is currently open to [facility] for PT, OT, and RN.  CSW updated physician, and [facility] will need new orders for home health.  CSW updated attending physician.    Patient will be going home with home health through [facility].  CSW signing off please reconsult with any other social work needs, home health agency has been notified of planned discharge.    Final next level of care: [place] Barriers to Discharge: Barriers Resolved   Patient Goals and CMS Choice Patient states their goals for this hospitalization and ongoing recovery are:: To return back home with home health services. CMS Medicare.gov Compare Post Acute Care list provided to:: Patient Choice offered to / list presented to : Patient  Discharge Placement                       Discharge Plan and Services                          HH Arranged: RN, PT, OT [facility] Agency: [place] ([facility]) Date [facility] Agency Contacted: 10/16/20 Time [place]: 7048 Representative spoke with at [place]: [place] ([place]) Interventions     Readmission Risk Interventions Readmission Risk Prevention Plan 05/02/2020  Transportation Screening Complete  PCP or Specialist Appt within 3-5 Days Complete  HRI or [place] Complete  Social Work Consult for [place] Planning/Counseling Complete  Palliative Care Screening Not Applicable  Medication Review Press photographer) Referral to Pharmacy  Some recent data might be hidden

## 2020-10-16 NOTE — Telephone Encounter (Signed)
Called Tonya Cornellias at the [place]. Left detailed message re: clearance for EGD

## 2020-10-16 NOTE — Discharge Summary (Signed)
Physician Discharge Summary  Corey Wood QMG:[id number] DOB: 03-09-1951 DOA: 10/14/2020  PCP: [name], MD  Admit date: 10/14/2020 Discharge date: 10/16/2020  Admitted From: From home Disposition: Home with home health  Recommendations for Outpatient Follow-up:  Follow up with PCP in 1-2 weeks   Home Health: PT/OT/RN Equipment/Devices: None required  Discharge Condition: Stable CODE STATUS: Full code Diet recommendation: Low-salt and low-carb diet  Discharge summary: 70 year old gentleman with history of type 2 diabetes on insulin, hypertension, hyperlipidemia, CKD stage IIIa, chronic hepatic cirrhosis and esophageal varices, COPD, depression, GERD who presents to the emergency room with nausea, vomiting, feeling sick and dehydrated after celebrating a big gathering of his 50th wedding anniversary last week.  In the emergency room temperature 100.3, blood pressure stable.  89% on room air.  Pancytopenic that is mostly chronic. Creatinine 1.69.  Lactic acid normal.  COVID-19 positive.  Chest x-ray with hazy right lung base opacity consistent with atelectasis.  Admitted with significant symptoms and hypoxia initially but ultimately improved.    # COVID-19 virus infection with hypoxia: Initially reported multiple symptoms but quickly improved and today he is asymptomatic.  Admitted due to significant symptoms on presentation.  Treated with steroids and remdesivir.  Today he is on room air and mobilizing. Remdesivir day 3/3 today. Will prescribe 5 more days of dexamethasone to complete 10 days of therapy. He will exercise isolation precautions at home for 10 days. We will continue to do breathing exercises and incentive spirometry at home to prevent complications. He can use over-the-counter cough medications and Tylenol.    # Insulin-dependent diabetes: Resume home doses of insulin as he is eating full meal now.  Fairly stable. Appetite is good.  Nausea is improved.  Anticipate  slight worsening of blood sugars with use of steroids, however will taper off quickly within the next 5 days. He is prescribed 16 units of Lantus, however currently taking 21 units at home that he will continue.  # Hepatic cirrhosis with esophageal varices: Scheduled for banding and EGD on 7/26.  GI to reschedule.  Has chronic pancytopenia and remains a stable.  Patient has done great clinical recovery and is able to go home today.  He will benefit with rehab at home with outpatient home health therapies.   Discharge Diagnoses:  Principal Problem:   COVID-19 Active Problems:   Type 2 diabetes mellitus without complication, without long-term current use of insulin (HCC)   Hepatic cirrhosis (HCC)   AKI (acute kidney injury) ([place])   Pancytopenia ([place])   Hypokalemia    Discharge Instructions  Discharge Instructions     Call MD for:  difficulty breathing, headache or visual disturbances   Complete by: As directed    Call MD for:  temperature >100.4   Complete by: As directed    Diet - low sodium heart healthy   Complete by: As directed    Diet Carb Modified   Complete by: As directed    Discharge instructions   Complete by: As directed    You can use over-the-counter cough medications and Tylenol.   Increase activity slowly   Complete by: As directed       Allergies as of 10/16/2020       Reactions   Lisinopril Cough        Medication List     STOP taking these medications    ondansetron 4 MG disintegrating tablet Commonly known as: Zofran ODT       TAKE these medications  atorvastatin 40 MG tablet Commonly known as: LIPITOR Take 40 mg by mouth at bedtime.   buPROPion 150 MG 24 hr tablet Commonly known as: WELLBUTRIN XL Take 150 mg by mouth daily.   cetirizine 10 MG tablet Commonly known as: ZYRTEC Chew 10 mg by mouth at bedtime.   citalopram 40 MG tablet Commonly known as: CELEXA Take 40 mg by mouth daily.   dexamethasone 6 MG tablet Commonly  known as: DECADRON Take 1 tablet (6 mg total) by mouth daily for 5 days.   FeroSul 325 (65 FE) MG tablet Generic drug: ferrous sulfate TAKE 1 TABLET (325 MG TOTAL) BY MOUTH TWO TIMES DAILY WITH A MEAL. What changed:  how much to take how to take this when to take this additional instructions   fluticasone 50 MCG/ACT nasal spray Commonly known as: FLONASE Place 1 spray into both nostrils daily as needed for allergies or rhinitis.   furosemide 20 MG tablet Commonly known as: LASIX Take 20 mg by mouth 2 (two) times daily.   guaifenesin 100 MG/5ML syrup Commonly known as: ROBITUSSIN Take 200 mg by mouth 3 (three) times daily as needed for cough.   insulin glargine 100 unit/mL Sopn Commonly known as: LANTUS Inject 16 Units into the skin daily.   ipratropium 0.03 % nasal spray Commonly known as: ATROVENT Place 1 spray into both nostrils at bedtime.   latanoprost 0.005 % ophthalmic solution Commonly known as: XALATAN Apply 1 drop to eye at bedtime.   loratadine 10 MG tablet Commonly known as: CLARITIN Take 10 mg by mouth daily.   meclizine 25 MG tablet Commonly known as: ANTIVERT Take 25 mg by mouth 2 (two) times daily.   montelukast 10 MG tablet Commonly known as: SINGULAIR Take 10 mg by mouth daily.   pantoprazole 40 MG tablet Commonly known as: PROTONIX TAKE 1 TABLET (40 MG TOTAL) BY MOUTH TWO TIMES DAILY BEFORE A MEAL. What changed:  how much to take how to take this when to take this   spironolactone 50 MG tablet Commonly known as: ALDACTONE Take 1 tablet (50 mg total) by mouth daily.   tamsulosin 0.4 MG Caps capsule Commonly known as: FLOMAX Take 0.4 mg by mouth 2 (two) times daily.   traZODone 150 MG tablet Commonly known as: DESYREL Take 75 mg by mouth at bedtime.        Follow-up Information     Mulles, Corazon, MD Follow up in 2 week(s).   Specialty: Internal Medicine Contact information: [place]  [address] [phone]                Allergies  Allergen Reactions   Lisinopril Cough    Consultations: None   Procedures/Studies: DG Chest Port 1 View  Result Date: 10/14/2020 CLINICAL DATA:  Questionable sepsis - evaluate for abnormality Cough and fever.  Weakness. EXAM: PORTABLE CHEST 1 VIEW COMPARISON:  Radiograph 08/28/2020 FINDINGS: Stable heart size and mediastinal contours, heart size upper normal. Unchanged blunting of left costophrenic angle with basilar scarring and small pleural effusion. Similar blunting of right costophrenic angle. There may be a fluid in the right minor fissure. Mild hazy right lung base opacity. No pneumothorax. No pulmonary edema. No acute osseous abnormalities are seen. IMPRESSION: 1. Hazy right lung base opacity may be atelectasis, pneumonia, or layering effusion. 2. Possible fluid in the right minor fissure. Stable blunting of the right costophrenic angle, possible effusion. 3. Left basilar scarring and small left pleural effusion are similar. Electronically Signed  By: Keith Rake M.D.   On: 10/14/2020 16:49   US Abdomen Limited RUQ (LIVER/GB)  Result Date: 09/23/2020 CLINICAL DATA:  Hepatic cirrhosis with ascites EXAM: ULTRASOUND ABDOMEN LIMITED RIGHT UPPER QUADRANT COMPARISON:  05/01/2020 FINDINGS: Gallbladder: Status post cholecystectomy Common bile duct: Diameter: 6 mm Liver: Frontal nodularity of hepatic contours with moderately coarsened parenchymal echogenicity is consistent with cirrhosis. No focal hepatic lesion. portal vein is patent on color Doppler imaging with normal direction of blood flow towards the liver. Other: Minimal perihepatic ascites. Right pleural effusion partially visualized. IMPRESSION: Cirrhotic liver morphology.  No focal hepatic lesion. Electronically Signed   By: Miachel Roux M.D.   On: 09/23/2020 17:15   IR Paracentesis  Result Date: 09/18/2020 INDICATION: Patient with a history of cryptogenic cirrhosis and new  onset ascites presents today for therapeutic and diagnostic paracentesis. EXAM: ULTRASOUND GUIDED PARACENTESIS MEDICATIONS: 1% lidocaine 10 COMPLICATIONS: None immediate. PROCEDURE: Informed written consent was obtained from the patient after a discussion of the risks, benefits and alternatives to treatment. A timeout was performed prior to the initiation of the procedure. Initial ultrasound scanning demonstrates a large amount of ascites within the left lower abdominal quadrant. The left lower abdomen was prepped and draped in the usual sterile fashion. 1% lidocaine was used for local anesthesia. Following this, a 19 gauge, 7-cm, Yueh catheter was introduced. An ultrasound image was saved for documentation purposes. The paracentesis was performed. The catheter was removed and a dressing was applied. The patient tolerated the procedure well without immediate post procedural complication. FINDINGS: A total of approximately 1.7 L of clear yellow fluid was removed. Samples were sent to the laboratory as requested by the clinical team. IMPRESSION: Successful ultrasound-guided paracentesis yielding 1.7 liters of peritoneal fluid. Read by: Soyla Dryer, NP Electronically Signed   By: Lucrezia Europe M.D.   On: 09/18/2020 11:21   (Echo, Carotid, EGD, Colonoscopy, ERCP)    Subjective: Patient seen and examined.  Denies any complaints.  No nausea or vomiting.  On room air.  Walking around the room without shortness of breath. No bowel movement yet, he wants to use some laxatives.   Discharge Exam: Vitals:   10/16/20 0431 10/16/20 0743  BP: 112/72   Pulse: (!) 52   Resp: 18   Temp: 97.9 F (36.6 C)   SpO2: 91% 92%   Vitals:   10/15/20 2057 10/15/20 2357 10/16/20 0431 10/16/20 0743  BP: 128/83 118/62 112/72   Pulse: 64 (!) 54 (!) 52   Resp: 18 18 18    Temp: 99.3 F (37.4 C) 98.5 F (36.9 C) 97.9 F (36.6 C)   TempSrc: Oral Oral Oral   SpO2: 96% 93% 91% 92%  Weight:      Height:        General: Pt  is alert, awake, not in acute distress Cardiovascular: RRR, S1/S2 +, no rubs, no gallops Respiratory: CTA bilaterally, no wheezing, no rhonchi Abdominal: Soft, NT, ND, bowel sounds + Extremities: no edema, no cyanosis    The results of significant diagnostics from this hospitalization (including imaging, microbiology, ancillary and laboratory) are listed below for reference.     Microbiology: Recent Results (from the past 240 hour(s))  Blood culture (routine single)     Status: None (Preliminary result)   Collection Time: 10/14/20  4:24 PM   Specimen: BLOOD  Result Value Ref Range Status   Specimen Description   Final    BLOOD LEFT ANTECUBITAL Performed at [facility] [address]  [place], [place] [address]    Special Requests   Final    BOTTLES DRAWN AEROBIC AND ANAEROBIC Blood Culture results may not be optimal due to an inadequate volume of blood received in culture bottles Performed at [facility] [address] [place], [place] [address]    Culture   Final    NO GROWTH < 12 HOURS Performed at [facility] [address], [place], [place] [address]    Report Status PENDING  Incomplete  Resp Panel by RT-PCR (Flu A&B, Covid) Nasopharyngeal Swab     Status: Abnormal   Collection Time: 10/14/20  4:25 PM   Specimen: Nasopharyngeal Swab; Nasopharyngeal(NP) swabs in vial transport medium  Result Value Ref Range Status   SARS Coronavirus 2 by RT PCR POSITIVE (A) NEGATIVE Final    Comment: RESULT CALLED TO, READ BACK BY AND VERIFIED WITH: SONYA, RN @ 2028 ON 10/14/20 C VARNER (NOTE) SARS-CoV-2 target nucleic acids are DETECTED.  The SARS-CoV-2 RNA is generally detectable in upper respiratory specimens during the acute phase of infection. Positive results are indicative of the presence of the identified virus, but do not rule out bacterial infection or co-infection with other pathogens not detected by the test. Clinical  correlation with patient history and other diagnostic information is necessary to determine patient infection status. The expected result is Negative.  Fact Sheet for Patients: EntrepreneurPulse.com.au  Fact Sheet for Healthcare Providers: IncredibleEmployment.be  This test is not yet approved or cleared by the [place] FDA and  has been authorized for detection and/or diagnosis of SARS-CoV-2 by FDA under an Emergency Use Authorization (EUA).  This EUA will remain in effect (meaning this test can  be used) for the duration of  the COVID-19 declaration under Section 564(b)(1) of the Act, 21 U.S.C. section 360bbb-3(b)(1), unless the authorization is terminated or revoked sooner.     Influenza A by PCR NEGATIVE NEGATIVE Final   Influenza B by PCR NEGATIVE NEGATIVE Final    Comment: (NOTE) The Xpert Xpress SARS-CoV-2/FLU/RSV plus assay is intended as an aid in the diagnosis of influenza from Nasopharyngeal swab specimens and should not be used as a sole basis for treatment. Nasal washings and aspirates are unacceptable for Xpert Xpress SARS-CoV-2/FLU/RSV testing.  Fact Sheet for Patients: EntrepreneurPulse.com.au  Fact Sheet for Healthcare Providers: IncredibleEmployment.be  This test is not yet approved or cleared by the [place] FDA and has been authorized for detection and/or diagnosis of SARS-CoV-2 by FDA under an Emergency Use Authorization (EUA). This EUA will remain in effect (meaning this test can be used) for the duration of the COVID-19 declaration under Section 564(b)(1) of the Act, 21 U.S.C. section 360bbb-3(b)(1), unless the authorization is terminated or revoked.  Performed at [facility], [place] [address], [place], [place] [address]   Urine Culture     Status: None   Collection Time: 10/14/20  5:20 PM   Specimen: In/Out Cath Urine  Result Value Ref Range Status    Specimen Description   Final    IN/OUT CATH URINE Performed at [facility] [address], [place], [place] [address]    Special Requests   Final    Immunocompromised Performed at [facility], [place] [address], [place], [place] [address]    Culture   Final    NO GROWTH Performed at [facility], [place] [address] [place], [place] [address]    Report Status 10/16/2020 FINAL  Final  Labs: BNP (last 3 results) No results for input(s): BNP in the last 8760 hours. Basic Metabolic Panel: Recent Labs  Lab 10/14/20 1622 10/15/20 0447 10/16/20 0347  NA 137 139 137  K 3.3* 3.7 3.8  CL 95* 99 99  CO2 30 29 27   GLUCOSE 120* 148* 180*  BUN 17 18 26*  CREATININE 1.61* 1.50* 1.23  CALCIUM 9.1 8.7* 9.2   Liver Function Tests: Recent Labs  Lab 10/14/20 1622 10/15/20 0447 10/16/20 0347  AST 28 32 33  ALT 17 17 18   ALKPHOS 89 77 78  BILITOT 0.8 0.9 0.6  PROT 8.0 7.1 7.2  ALBUMIN 3.5 3.1* 3.1*   No results for input(s): LIPASE, AMYLASE in the last 168 hours. Recent Labs  Lab 10/14/20 1622  AMMONIA 40*   CBC: Recent Labs  Lab 10/14/20 1622 10/15/20 0447 10/16/20 0347  WBC 2.7* 1.7* 1.7*  NEUTROABS 1.7 1.2* 1.1*  HGB 9.9* 9.6* 9.7*  HCT 30.5* 29.6* 30.1*  MCV 90.0 90.0 90.4  PLT 71* 67* 66*   Cardiac Enzymes: No results for input(s): CKTOTAL, CKMB, CKMBINDEX, TROPONINI in the last 168 hours. BNP: Invalid input(s): POCBNP CBG: Recent Labs  Lab 10/15/20 0730 10/15/20 1220 10/15/20 1614 10/15/20 2058 10/16/20 0743  GLUCAP 182* 235* 213* 191* 212*   D-Dimer Recent Labs    10/15/20 0447 10/16/20 0347  DDIMER 2.87* 2.38*   Hgb A1c Recent Labs    10/15/20 0447  HGBA1C 7.2*   Lipid Profile No results for input(s): CHOL, HDL, LDLCALC, TRIG, CHOLHDL, LDLDIRECT in the last 72 hours. Thyroid function studies No results for input(s): TSH, T4TOTAL, T3FREE, THYROIDAB in the last 72 hours.  Invalid input(s):  FREET3 Anemia work up Recent Labs    10/15/20 0447 10/16/20 0347  FERRITIN 306 390*   Urinalysis    Component Value Date/Time   COLORURINE YELLOW 10/14/2020 [place] 10/14/2020 1720   LABSPEC 1.014 10/14/2020 1720   PHURINE 5.0 10/14/2020 1720   GLUCOSEU NEGATIVE 10/14/2020 1720   HGBUR SMALL (A) 10/14/2020 1720   BILIRUBINUR NEGATIVE 10/14/2020 1720   KETONESUR NEGATIVE 10/14/2020 1720   PROTEINUR NEGATIVE 10/14/2020 1720   NITRITE NEGATIVE 10/14/2020 1720   LEUKOCYTESUR NEGATIVE 10/14/2020 1720   Sepsis Labs Invalid input(s): PROCALCITONIN,  WBC,  LACTICIDVEN Microbiology Recent Results (from the past 240 hour(s))  Blood culture (routine single)     Status: None (Preliminary result)   Collection Time: 10/14/20  4:24 PM   Specimen: BLOOD  Result Value Ref Range Status   Specimen Description   Final    BLOOD LEFT ANTECUBITAL Performed at [facility], [place] [address], [place], [place] [address]    Special Requests   Final    BOTTLES DRAWN AEROBIC AND ANAEROBIC Blood Culture results may not be optimal due to an inadequate volume of blood received in culture bottles Performed at [facility] [address], [place], [place] [address]    Culture   Final    NO GROWTH < 12 HOURS Performed at [facility] [address], [place],  [address]    Report Status PENDING  Incomplete  Resp Panel by RT-PCR (Flu A&B, Covid) Nasopharyngeal Swab     Status: Abnormal   Collection Time: 10/14/20  4:25 PM   Specimen: Nasopharyngeal Swab; Nasopharyngeal(NP) swabs in vial transport medium  Result Value Ref Range Status   SARS Coronavirus 2 by RT PCR POSITIVE (A) NEGATIVE Final    Comment: RESULT  CALLED TO, READ BACK BY AND VERIFIED WITH: SONYA, RN @ 2028 ON 10/14/20 C VARNER (NOTE) SARS-CoV-2 target nucleic acids are DETECTED.  The SARS-CoV-2 RNA is generally detectable in upper respiratory specimens during the acute  phase of infection. Positive results are indicative of the presence of the identified virus, but do not rule out bacterial infection or co-infection with other pathogens not detected by the test. Clinical correlation with patient history and other diagnostic information is necessary to determine patient infection status. The expected result is Negative.  Fact Sheet for Patients: EntrepreneurPulse.com.au  Fact Sheet for Healthcare Providers: IncredibleEmployment.be  This test is not yet approved or cleared by the [place] FDA and  has been authorized for detection and/or diagnosis of SARS-CoV-2 by FDA under an Emergency Use Authorization (EUA).  This EUA will remain in effect (meaning this test can  be used) for the duration of  the COVID-19 declaration under Section 564(b)(1) of the Act, 21 U.S.C. section 360bbb-3(b)(1), unless the authorization is terminated or revoked sooner.     Influenza A by PCR NEGATIVE NEGATIVE Final   Influenza B by PCR NEGATIVE NEGATIVE Final    Comment: (NOTE) The Xpert Xpress SARS-CoV-2/FLU/RSV plus assay is intended as an aid in the diagnosis of influenza from Nasopharyngeal swab specimens and should not be used as a sole basis for treatment. Nasal washings and aspirates are unacceptable for Xpert Xpress SARS-CoV-2/FLU/RSV testing.  Fact Sheet for Patients: EntrepreneurPulse.com.au  Fact Sheet for Healthcare Providers: IncredibleEmployment.be  This test is not yet approved or cleared by the [place] FDA and has been authorized for detection and/or diagnosis of SARS-CoV-2 by FDA under an Emergency Use Authorization (EUA). This EUA will remain in effect (meaning this test can be used) for the duration of the COVID-19 declaration under Section 564(b)(1) of the Act, 21 U.S.C. section 360bbb-3(b)(1), unless the authorization is terminated or revoked.  Performed at  [facility], [place] [address] [place], [place] [address]   Urine Culture     Status: None   Collection Time: 10/14/20  5:20 PM   Specimen: In/Out Cath Urine  Result Value Ref Range Status   Specimen Description   Final    IN/OUT CATH URINE Performed at [facility] [address], [place], [place] [address]    Special Requests   Final    Immunocompromised Performed at [facility], [place] [address] [place], [place] [address]    Culture   Final    NO GROWTH Performed at [facility], [place] [address], [place], [place] [address]    Report Status 10/16/2020 FINAL  Final     Time coordinating discharge:  32 minutes  SIGNED:   Barb Merino, MD  [facility] 10/16/2020, 10:29 AM

## 2020-10-16 NOTE — Telephone Encounter (Signed)
Procedure rescheduled for 11-10-20. Per Patient's wife, he has an appointment with Dr. Marjo Bicker next week and they will address this request for clearance with her at that visit.

## 2020-10-16 NOTE — Telephone Encounter (Signed)
Thanks Jan, yes okay to do August 15th. Hospital policy for procedure is 10 days out from COVID dx and endo at [facility] told me his case had to be cancelled based on that polocy. Glad to hear he is feeling better from [place], I don't think delaying this a few weeks will be a problem

## 2020-10-16 NOTE — Telephone Encounter (Signed)
Patient has been scheduled for EGD at [facility] on Monday, 11-10-20 at 7:30 am to arrive at 6:00 am. PV scheduled for 10-27-20 at 4:30pm. LM for patient's wife regarding procedure and PV appts.

## 2020-10-19 LAB — CULTURE, BLOOD (SINGLE): Culture: NO GROWTH

## 2020-10-21 ENCOUNTER — Ambulatory Visit (HOSPITAL_COMMUNITY): Admission: RE | Admit: 2020-10-21 | Payer: Non-veteran care | Source: Home / Self Care | Admitting: Gastroenterology

## 2020-10-21 ENCOUNTER — Encounter (HOSPITAL_COMMUNITY): Admission: RE | Payer: Self-pay | Source: Home / Self Care

## 2020-10-21 SURGERY — ESOPHAGOGASTRODUODENOSCOPY (EGD) WITH PROPOFOL
Anesthesia: Monitor Anesthesia Care

## 2020-10-27 ENCOUNTER — Other Ambulatory Visit: Payer: Self-pay

## 2020-10-27 ENCOUNTER — Ambulatory Visit (AMBULATORY_SURGERY_CENTER): Payer: No Typology Code available for payment source

## 2020-10-27 VITALS — Ht 67.0 in | Wt 179.0 lb

## 2020-10-27 DIAGNOSIS — K7460 Unspecified cirrhosis of liver: Secondary | ICD-10-CM

## 2020-10-27 DIAGNOSIS — I8510 Secondary esophageal varices without bleeding: Secondary | ICD-10-CM

## 2020-10-27 NOTE — Telephone Encounter (Signed)
Called nurse navigator, [place] at [place].  She is out of the office this week. Called [phone] Chelsey, who is covering for her. LM re: needing clearance for upcoming EGD scheduled for 8-15. Called and spoke to patient's wife, Corey Wood. Pt saw Dr. Marjo Bicker last week on Friday and she indicated that she said she would clear him for the surgery. Even though he is in a "weakened state", she feels he needs to go ahead with the EGD. I let her know I will wait to hear from the [place] hopefully in the next few days. If not he may need to call and request the clearance himself be sent to [place].

## 2020-10-27 NOTE — Progress Notes (Signed)
Patient's pre-visit was done today over the phone with the patient  Name,DOB and address verified. Patient denies any allergies to Eggs and Soy. Patient denies any problems with anesthesia/sedation. Patient denies taking diet pills or blood thinners. No home Oxygen. Packet of Prep instructions mailed to patient including a copy of a consent form-pt is aware. Patient understands to call us back with any questions or concerns. Patient is aware of our care-partner policy and 0000000 safety protocol.      The patient is COVID-19 vaccinated, per patient.

## 2020-10-28 NOTE — Telephone Encounter (Signed)
Patient had a virtual pre visit yesterday for procedure on 8-15.

## 2020-10-29 NOTE — Telephone Encounter (Signed)
Called and spoke to patient's wife, Ivin Booty and let her know we still have not heard back from Dr. Marjo Bicker with clearance for patient to have his EGD. She indicated she knows that Dr. Marjo Bicker thinks he should have the surgery. I let her know we just need documentation and let her know any help she can give call the [facility] would be appreciated.

## 2020-10-30 ENCOUNTER — Encounter (HOSPITAL_COMMUNITY): Payer: Self-pay | Admitting: Gastroenterology

## 2020-10-30 ENCOUNTER — Telehealth: Payer: Self-pay | Admitting: Gastroenterology

## 2020-10-30 ENCOUNTER — Other Ambulatory Visit: Payer: Self-pay

## 2020-10-30 NOTE — Telephone Encounter (Signed)
Pt's wife Ivin Booty called to check if you have gotten a message from Dr. Marjo Bicker about pt's surgery.

## 2020-10-30 NOTE — Telephone Encounter (Signed)
Called and spoke to patient's wife, Ivin Booty and let her know I have not heard from Dr. Marjo Bicker. I will try her nurse navigator again on Monday when she returns.

## 2020-10-30 NOTE — Telephone Encounter (Signed)
Ivin Booty, patient's wife called to see if we have heard anything. I let her know we have not. I will call Kenney Houseman again on Monday when she returns from being out.

## 2020-11-03 NOTE — Telephone Encounter (Signed)
Received fax from Dr. [name] from the [facility] [phone]) dated 11-03-20 that patient HAS been cleared to have EGD with banding of esophageal varices procedure with [facility] this month.  Clearance letter scanned to chart.

## 2020-11-03 NOTE — Telephone Encounter (Signed)
Alberteen Sam, nurse navigator, at [phone]. Left another detailed message regarding clearance for EGD

## 2020-11-09 NOTE — Anesthesia Preprocedure Evaluation (Signed)
Anesthesia Evaluation  Patient identified by MRN, date of birth, ID band Patient awake    Reviewed: Allergy & Precautions, H&P , NPO status , Patient's Chart, lab work & pertinent test results  Airway Mallampati: III  TM Distance: >3 FB Neck ROM: Full    Dental no notable dental hx. (+) Teeth Intact, Dental Advisory Given   Pulmonary asthma , sleep apnea and Continuous Positive Airway Pressure Ventilation , COPD,  COPD inhaler,    Pulmonary exam normal breath sounds clear to auscultation       Cardiovascular hypertension, Pt. on medications + Valvular Problems/Murmurs (Normal EF. Mild AI) AI  Rhythm:Regular Rate:Normal     Neuro/Psych Anxiety Depression negative neurological ROS     GI/Hepatic Neg liver ROS, GERD  Medicated,  Endo/Other  negative endocrine ROSdiabetes, Insulin DependentHypothyroidism   Renal/GU Renal disease  negative genitourinary   Musculoskeletal  (+) Arthritis , Osteoarthritis,    Abdominal   Peds  Hematology  (+) Blood dyscrasia, anemia ,   Anesthesia Other Findings   Reproductive/Obstetrics negative OB ROS                             Anesthesia Physical  Anesthesia Plan  ASA: 3  Anesthesia Plan: MAC   Post-op Pain Management:    Induction: Intravenous  PONV Risk Score and Plan: 1 and Propofol infusion and Treatment may vary due to age or medical condition  Airway Management Planned: Natural Airway and Nasal Cannula  Additional Equipment:   Intra-op Plan:   Post-operative Plan:   Informed Consent: I have reviewed the patients History and Physical, chart, labs and discussed the procedure including the risks, benefits and alternatives for the proposed anesthesia with the patient or authorized representative who has indicated his/her understanding and acceptance.     Dental advisory given  Plan Discussed with: CRNA  Anesthesia Plan Comments:          Anesthesia Quick Evaluation

## 2020-11-10 ENCOUNTER — Encounter (HOSPITAL_COMMUNITY): Payer: Self-pay | Admitting: Gastroenterology

## 2020-11-10 ENCOUNTER — Other Ambulatory Visit: Payer: Self-pay

## 2020-11-10 ENCOUNTER — Encounter (HOSPITAL_COMMUNITY)
Admission: RE | Disposition: A | Discharge status: Home / Self Care | Payer: Self-pay | Source: Home / Self Care | Attending: Gastroenterology

## 2020-11-10 ENCOUNTER — Ambulatory Visit (HOSPITAL_COMMUNITY): Payer: No Typology Code available for payment source | Admitting: Anesthesiology

## 2020-11-10 ENCOUNTER — Telehealth: Payer: Self-pay | Admitting: Gastroenterology

## 2020-11-10 ENCOUNTER — Ambulatory Visit (HOSPITAL_COMMUNITY)
Admission: RE | Admit: 2020-11-10 | Discharge: 2020-11-10 | Disposition: A | Discharge status: Home / Self Care | Payer: No Typology Code available for payment source | Attending: Gastroenterology | Admitting: Gastroenterology

## 2020-11-10 DIAGNOSIS — I8510 Secondary esophageal varices without bleeding: Secondary | ICD-10-CM | POA: Insufficient documentation

## 2020-11-10 DIAGNOSIS — I8500 Esophageal varices without bleeding: Secondary | ICD-10-CM

## 2020-11-10 DIAGNOSIS — Z8616 Personal history of COVID-19: Secondary | ICD-10-CM | POA: Insufficient documentation

## 2020-11-10 DIAGNOSIS — K766 Portal hypertension: Secondary | ICD-10-CM | POA: Diagnosis not present

## 2020-11-10 DIAGNOSIS — K7460 Unspecified cirrhosis of liver: Secondary | ICD-10-CM | POA: Insufficient documentation

## 2020-11-10 DIAGNOSIS — K3189 Other diseases of stomach and duodenum: Secondary | ICD-10-CM | POA: Diagnosis not present

## 2020-11-10 DIAGNOSIS — Z888 Allergy status to other drugs, medicaments and biological substances status: Secondary | ICD-10-CM | POA: Diagnosis not present

## 2020-11-10 HISTORY — PX: ESOPHAGOGASTRODUODENOSCOPY (EGD) WITH PROPOFOL: SHX5813

## 2020-11-10 HISTORY — PX: ESOPHAGEAL BANDING: SHX5518

## 2020-11-10 LAB — GLUCOSE, CAPILLARY
Glucose-Capillary: 154 mg/dL — ABNORMAL HIGH (ref 70–99)
Glucose-Capillary: 145 mg/dL — ABNORMAL HIGH (ref 70–99)

## 2020-11-10 SURGERY — ESOPHAGOGASTRODUODENOSCOPY (EGD) WITH PROPOFOL
Anesthesia: Monitor Anesthesia Care

## 2020-11-10 MED ORDER — ONDANSETRON HCL 4 MG/2ML IJ SOLN
4.0000 mg | Freq: Once | INTRAMUSCULAR | Status: AC
  Administered 2020-11-10: 4 mg via INTRAVENOUS

## 2020-11-10 MED ORDER — LIDOCAINE 2% (20 MG/ML) 5 ML SYRINGE
INTRAMUSCULAR | Status: DC | PRN
  Administered 2020-11-10: 100 mg via INTRAVENOUS

## 2020-11-10 MED ORDER — PHENYLEPHRINE 40 MCG/ML (10ML) SYRINGE FOR IV PUSH (FOR BLOOD PRESSURE SUPPORT)
PREFILLED_SYRINGE | INTRAVENOUS | Status: DC | PRN
  Administered 2020-11-10 (×2): 80 ug via INTRAVENOUS

## 2020-11-10 MED ORDER — PROPOFOL 500 MG/50ML IV EMUL
INTRAVENOUS | Status: AC
  Filled 2020-11-10: qty 50

## 2020-11-10 MED ORDER — SODIUM CHLORIDE 0.9 % IV SOLN: INTRAVENOUS | Status: DC

## 2020-11-10 MED ORDER — PROPOFOL 10 MG/ML IV BOLUS
INTRAVENOUS | Status: DC | PRN
  Administered 2020-11-10 (×4): 20 mg via INTRAVENOUS

## 2020-11-10 MED ORDER — LACTATED RINGERS IV SOLN
INTRAVENOUS | Status: AC | PRN
  Administered 2020-11-10: 1000 mL via INTRAVENOUS

## 2020-11-10 MED ORDER — PROPOFOL 500 MG/50ML IV EMUL
INTRAVENOUS | Status: DC | PRN
  Administered 2020-11-10: 125 ug/kg/min via INTRAVENOUS

## 2020-11-10 MED ORDER — PROPOFOL 10 MG/ML IV BOLUS
INTRAVENOUS | Status: AC
  Filled 2020-11-10: qty 20

## 2020-11-10 MED ORDER — ONDANSETRON HCL 4 MG/2ML IJ SOLN
INTRAMUSCULAR | Status: AC
  Filled 2020-11-10: qty 2

## 2020-11-10 SURGICAL SUPPLY — 15 items

## 2020-11-10 NOTE — Telephone Encounter (Signed)
Inbound call from patient's wife stating patient has been vomiting since having his procedure and wants to know if he can take a medication he has for nausea.  Please advise.

## 2020-11-10 NOTE — Transfer of Care (Signed)
Immediate Anesthesia Transfer of Care Note  Patient: SUBHAN HOOPES  Procedure(s) Performed: ESOPHAGOGASTRODUODENOSCOPY (EGD) WITH PROPOFOL ESOPHAGEAL BANDING  Patient Location: Endoscopy Unit  Anesthesia Type:MAC  Level of Consciousness: drowsy  Airway & Oxygen Therapy: Patient Spontanous Breathing and Patient connected to face mask oxygen  Post-op Assessment: Report given to RN and Post -op Vital signs reviewed and stable  Post vital signs: Reviewed and stable  Last Vitals:  Vitals Value Taken Time  BP    Temp    Pulse 94 11/10/20 0752  Resp 31 11/10/20 0752  SpO2 99 % 11/10/20 0752  Vitals shown include unvalidated device data.  Last Pain:  Vitals:   11/10/20 0631  TempSrc: Oral  PainSc: 0-No pain         Complications: No notable events documented.

## 2020-11-10 NOTE — Op Note (Signed)
[facility] Patient Name: Corey Wood Procedure Date: 11/10/2020 MRN: [id number] Attending MD: Carlota Raspberry. Havery Moros , MD Date of Birth: 07-24-1950 CSN: [id number] Age: 70 Admit Type: Outpatient Procedure:                Upper GI endoscopy Indications:              For therapy of esophageal varices - history of                            large esophageal varices, total of 23 bands placed                            thus far over multiple exams. Intolerant of beta                            blockade, has wished to proceed with banding as                            opposed to TIPS. Initial EGD with banding                            complicated by post banding ulcer bleed. No                            bleeding recently. Providers:                Carlota Raspberry. Havery Moros, MD, Kary Kos, RN, Hinton Dyer, Danley Danker, CRNA Referring MD:              Medicines:                Monitored Anesthesia Care Complications:            No immediate complications. Estimated blood loss:                            Minimal. Estimated Blood Loss:     Estimated blood loss was minimal. Procedure:                Pre-Anesthesia Assessment:                           - Prior to the procedure, a History and Physical                            was performed, and patient medications and                            allergies were reviewed. The patient's tolerance of                            previous anesthesia was also reviewed. The risks  and benefits of the procedure and the sedation                            options and risks were discussed with the patient.                            All questions were answered, and informed consent                            was obtained. Prior Anticoagulants: The patient has                            taken no previous anticoagulant or antiplatelet                            agents. ASA Grade Assessment:  III - A patient with                            severe systemic disease. After reviewing the risks                            and benefits, the patient was deemed in                            satisfactory condition to undergo the procedure.                           After obtaining informed consent, the endoscope was                            passed under direct vision. Throughout the                            procedure, the patient's blood pressure, pulse, and                            oxygen saturations were monitored continuously. The                            GIF-H190 ([id number]) Olympus endoscope was introduced                            through the mouth, and advanced to the second part                            of duodenum. The upper GI endoscopy was                            accomplished without difficulty. The patient                            tolerated the procedure well. Scope In: Scope Out: Findings:      Esophagogastric landmarks were identified: the Z-line was found at  42       cm, the gastroesophageal junction was found at 42 cm and the upper       extent of the gastric folds was found at 42 cm from the incisors.      Grade II varices were found in the middle third of the esophagus and in       the lower third of the esophagus. They were mostly medium to large (one       column) in size but significantly improved compared to prior exams with       scarring from prior banding noted. Six bands were successfully placed in       the lower to mid esophagus, resulting in deflation of varices.      The exam of the esophagus was otherwise normal.      Portal hypertensive gastropathy was found in the entire examined stomach.      The exam of the stomach was otherwise normal. The patient previously had       GOV2 varices noted on prior exams, but not appreciated on today's exam.      The duodenal bulb and second portion of the duodenum were normal. Impression:               -  Esophagogastric landmarks identified.                           - Esophageal varices as described above -                            improvement in size over time but columns still                            persist, mostly medium in size, one large column.                            Banded x 6.                           - Portal hypertensive gastropathy.                           - Normal duodenal bulb and second portion of the                            duodenum. Moderate Sedation:      No moderate sedation, case performed with MAC Recommendation:           - Patient has a contact number available for                            emergencies. The signs and symptoms of potential                            delayed complications were discussed with the                            patient. Return to normal activities tomorrow.  Written discharge instructions were provided to the                            patient.                           - Full liquid diet now, soft diet today, advance                            tomorrow as tolerated                           - Continue present medications (including protonix)                           - Repeat upper endoscopy in 1-2 months for further                            surveillance / retreatment as needed. Procedure Code(s):        --- Professional ---                           [phone], Esophagogastroduodenoscopy, flexible,                            transoral; with band ligation of esophageal/gastric                            varices Diagnosis Code(s):        --- Professional ---                           I85.00, Esophageal varices without bleeding                           K76.6, Portal hypertension                           K31.89, Other diseases of stomach and duodenum CPT copyright 2019 American Medical Association. All rights reserved. The codes documented in this report are preliminary and upon coder review may   be revised to meet current compliance requirements. Remo Lipps P. Talibah Colasurdo, MD 11/10/2020 7:50:20 AM This report has been signed electronically. Number of Addenda: 0

## 2020-11-10 NOTE — H&P (Signed)
HPI :  70 y/o male with cirrhosis, history of large esophageal varices treated with band ligation (23 bands placed so far since starting therapy), intolerant to beta blockade. Here for surveillance EGD. States he is feeling well today. Has not had any bleeding symptoms since I have seen him. Had COVID within the past 1-2 months but states he recovered well. He denies any chest pains or shortness of breath, denies any complaints today, wishes to proceed with the exam.  Past Medical History:  Diagnosis Date   Allergy    seasonal allergies   Anxiety    on meds   Aortic valve disorder    Arthritis    generalized (fingers)(shoulders)   Asthma    uses inhaler   Cataract    bilateral -sx    Cirrhosis (HCC)    CKD (chronic kidney disease), stage III (HCC)    only has one kidney   COPD (chronic obstructive pulmonary disease) (HCC)    Depression    on meds   DM (diabetes mellitus) ([place])    on meds   GERD (gastroesophageal reflux disease)    on meds   Heart murmur    History of colon polyps    History of COVID-19 10/14/2020   Hx of acute pancreatitis 10/2019   Hyperlipidemia    on meds   Hypertension    on meds   Hypothyroidism    not on meds at this time   Myelodysplastic syndrome ([place])    Neuromuscular disorder ([place])    per pt   Obstructive sleep apnea    Oxygen deficiency    Parkinsonism ([place])    per pt report   Peripheral edema    bilateral legs   Peripheral positional vertigo    Sleep apnea    No CPAP     Past Surgical History:  Procedure Laterality Date   ANKLE SURGERY     CHOLECYSTECTOMY     ENDARTERECTOMY Left 04/29/2020   Procedure: CAROTID EXPLORATION removal of  central venous catheter;  Surgeon: Rosetta Posner, MD;  Location: [facility] OR;  Service: Vascular;  Laterality: Left;   ESOPHAGEAL BANDING N/A 04/17/2020   Procedure: ESOPHAGEAL BANDING;  Surgeon: Yetta Flock, MD;  Location: [facility]COPY;  Service: Gastroenterology;  Laterality: N/A;   ESOPHAGEAL  BANDING  04/29/2020   Procedure: ESOPHAGEAL BANDING;  Surgeon: Rush Landmark Telford Nab., MD;  Location: [place];  Service: Gastroenterology;;   ESOPHAGEAL BANDING N/A 06/10/2020   Procedure: ESOPHAGEAL BANDING;  Surgeon: Yetta Flock, MD;  Location: [facility];  Service: Gastroenterology;  Laterality: N/A;   ESOPHAGOGASTRODUODENOSCOPY N/A 04/25/2020   Procedure: ESOPHAGOGASTRODUODENOSCOPY (EGD);  Surgeon: Carol Ada, MD;  Location: [facility];  Service: Endoscopy;  Laterality: N/A;   ESOPHAGOGASTRODUODENOSCOPY (EGD) WITH PROPOFOL N/A 04/17/2020   Procedure: ESOPHAGOGASTRODUODENOSCOPY (EGD) WITH PROPOFOL;  Surgeon: Yetta Flock, MD;  Location: [facility];  Service: Gastroenterology;  Laterality: N/A;   ESOPHAGOGASTRODUODENOSCOPY (EGD) WITH PROPOFOL N/A 04/29/2020   Procedure: ESOPHAGOGASTRODUODENOSCOPY (EGD) WITH PROPOFOL;  Surgeon: Rush Landmark Telford Nab., MD;  Location: [place];  Service: Gastroenterology;  Laterality: N/A;   ESOPHAGOGASTRODUODENOSCOPY (EGD) WITH PROPOFOL N/A 06/10/2020   Procedure: ESOPHAGOGASTRODUODENOSCOPY (EGD) WITH PROPOFOL;  Surgeon: Yetta Flock, MD;  Location: [facility];  Service: Gastroenterology;  Laterality: N/A;   EYE SURGERY     HERNIA REPAIR     IR PARACENTESIS  09/18/2020   KIDNEY STONE SURGERY     WISDOM TOOTH EXTRACTION     Family History  Problem Relation  Age of Onset   Liver cancer Mother    Colon cancer Neg Hx    Stomach cancer Neg Hx    Colon polyps Neg Hx    Esophageal cancer Neg Hx    Rectal cancer Neg Hx    Social History   Tobacco Use   Smoking status: Never   Smokeless tobacco: Never  Vaping Use   Vaping Use: Never used  Substance Use Topics   Alcohol use: Not Currently   Drug use: Not Currently   Current Facility-Administered Medications  Medication Dose Route Frequency Provider Last Rate Last Admin   0.9 %  sodium chloride infusion   Intravenous Continuous Rekha Hobbins, Carlota Raspberry, MD       lactated  ringers infusion    Continuous PRN Havery Moros, Carlota Raspberry, MD 10 mL/hr at 11/10/20 0647 1,000 mL at 11/10/20 0647   Allergies  Allergen Reactions   Lisinopril Cough     Review of Systems: All systems reviewed and negative except where noted in HPI.      Physical Exam: BP 136/68   Pulse 81   Temp 98.7 F (37.1 C) (Oral)   Resp (!) 24   Ht '5\' 7"'$  (1.702 m)   Wt 79.8 kg   SpO2 95%   BMI 27.55 kg/m  Constitutional: Pleasant, male in no acute distress. Cardiovascular: Normal rate, regular rhythm.  Pulmonary/chest: Effort normal and breath sounds normal. No wheezing, rales or rhonchi. Abdominal: Soft, nondistended, nontender.  Extremities: trace to (+) 1 edema LE B Neurological: Alert and oriented to person place and time. Skin: Skin is warm and dry. No rashes noted. Psychiatric: Normal mood and affect. Behavior is normal.   ASSESSMENT AND PLAN: 70 y/o male with a history of cirrhosis and large esophageal varices, intolerant of beta blockade, here for surveillance EGD to band varices if still present. I have discussed risks / benefits of endoscopy, banding, and anesthesia with him - answered his questions, he wishes to proceed. Further recommendations pending the results of his exam. He agrees with the plan.   Jolly Mango, MD [facility] Gastroenterology

## 2020-11-10 NOTE — Discharge Instructions (Signed)
YOU HAD AN ENDOSCOPIC PROCEDURE TODAY: Refer to the procedure report and other information in the discharge instructions given to you for any specific questions about what was found during the examination. If this information does not answer your questions, please call [place] office at [phone] to clarify.   YOU SHOULD EXPECT: Some feelings of bloating in the abdomen. Passage of more gas than usual. Walking can help get rid of the air that was put into your GI tract during the procedure and reduce the bloating. If you had a lower endoscopy (such as a colonoscopy or flexible sigmoidoscopy) you may notice spotting of blood in your stool or on the toilet paper. Some abdominal soreness may be present for a day or two, also.  DIET: Your first meal following the procedure should be a light meal and then it is ok to progress to your normal diet. A half-sandwich or bowl of soup is an example of a good first meal. Heavy or fried foods are harder to digest and may make you feel nauseous or bloated. Drink plenty of fluids but you should avoid alcoholic beverages for 24 hours. If you had a esophageal dilation, please see attached instructions for diet.    ACTIVITY: Your care partner should take you home directly after the procedure. You should plan to take it easy, moving slowly for the rest of the day. You can resume normal activity the day after the procedure however YOU SHOULD NOT DRIVE, use power tools, machinery or perform tasks that involve climbing or major physical exertion for 24 hours (because of the sedation medicines used during the test).   SYMPTOMS TO REPORT IMMEDIATELY: A gastroenterologist can be reached at any hour. Please call [phone]  for any of the following symptoms:   Following upper endoscopy (EGD, EUS, ERCP, esophageal dilation) Vomiting of blood or coffee ground material  New, significant abdominal pain  New, significant chest pain or pain under the shoulder blades  Painful or  persistently difficult swallowing  New shortness of breath  Black, tarry-looking or red, bloody stools  FOLLOW UP:  If any biopsies were taken you will be contacted by phone or by letter within the next 1-3 weeks. Call [phone]  if you have not heard about the biopsies in 3 weeks.  Please also call with any specific questions about appointments or follow up tests.  

## 2020-11-10 NOTE — Telephone Encounter (Signed)
Did not realize nursing staff was out of the office. I called the patient. He is feeling better since wife called in. He is tolerating apple sauce and water. He has some Zofran at home and took it, seems to help. He should continue to advance diet slowly, continue Zofran PRN. Hopefully with more time this resolves, he will contact me if this persists. He is not in any pain and feels okay otherwise. All questions answered.

## 2020-11-10 NOTE — Telephone Encounter (Signed)
Sorry to hear this. He should take Zofran PRN, can you help write him a script for this to take at home, '4mg'$  SL q 6 hours PRN, for a few doses. I think he will be okay with more time. Try liquids first slowly and stay sitting up after doing this, call back if no improvement. Thanks

## 2020-11-10 NOTE — Telephone Encounter (Signed)
Spoke with patient's wife, she states that patient vomited 3 small amounts today following his procedure. She states that Zofran helped. She tried to give him his routine meds earlier after the procedure but patient only had 3 blueberries. Advised that he may not have had enough food on his stomach. Ivin Booty states that patient has never had vomiting after his procedures before. She states that patient will take a few sips of water, go back to sleep and then has to vomit when he wakes up. She states that he is sleeping elevated. Please advise, thanks.

## 2020-11-10 NOTE — Anesthesia Procedure Notes (Signed)
Date/Time: 11/10/2020 7:20 AM Performed by: Sharlette Dense, CRNA Oxygen Delivery Method: Simple face mask

## 2020-11-10 NOTE — Anesthesia Postprocedure Evaluation (Signed)
Anesthesia Post Note  Patient: Corey Wood  Procedure(s) Performed: ESOPHAGOGASTRODUODENOSCOPY (EGD) WITH PROPOFOL ESOPHAGEAL BANDING     Patient location during evaluation: PACU Anesthesia Type: MAC Level of consciousness: awake and alert Pain management: pain level controlled Vital Signs Assessment: post-procedure vital signs reviewed and stable Respiratory status: spontaneous breathing, nonlabored ventilation, respiratory function stable and patient connected to nasal cannula oxygen Cardiovascular status: stable and blood pressure returned to baseline Postop Assessment: no apparent nausea or vomiting Anesthetic complications: no   No notable events documented.  Last Vitals:  Vitals:   11/10/20 0827 11/10/20 0833  BP: 127/76 (!) 150/63  Pulse: 71 62  Resp: 20 (!) 25  Temp:    SpO2: 90% 98%    Last Pain:  Vitals:   11/10/20 0833  TempSrc:   PainSc: 0-No pain                 Tiajuana Amass

## 2020-11-11 ENCOUNTER — Encounter (HOSPITAL_COMMUNITY): Payer: Self-pay | Admitting: Gastroenterology

## 2020-11-11 ENCOUNTER — Other Ambulatory Visit: Payer: Self-pay

## 2020-11-11 NOTE — Telephone Encounter (Signed)
Noted, thanks!

## 2020-11-18 ENCOUNTER — Telehealth: Payer: Self-pay | Admitting: Gastroenterology

## 2020-11-18 NOTE — Telephone Encounter (Signed)
Sorry to hear this.  The patient has had COVID in recent months, hopefully no recurrence.  He really needs to contact his primary care about persistent cough, cardiology does not think that fluid or volume overload is causing this based on their evaluation.  I agree that we would want to minimize this cough in light of his varices but hopefully the risk for repeated bleeding is low.  He should keep an eye on this and certainly let us know if he has any bleeding symptoms, but again he needs to contact his primary care about persistent cough and further work-up for that as its a bit outside the scope of our practice. Thanks

## 2020-11-18 NOTE — Telephone Encounter (Signed)
Inbound call from patient. States had procedure 8/15. Patient has had a persistent cough that started 8/21. Have not took a COVID test. Spoke with cardiologist, states need to speak with Dr. Havery Moros.

## 2020-11-18 NOTE — Telephone Encounter (Signed)
Spoke with Ivin Booty in regards to recommendations. She states that patient has an appt with PCP next week and its very hard to get through to them. Advised that if she did not feel comfortable waiting until his appt next week then she can always take him to an urgent care. Ivin Booty states that she will get another COVID test while she it out. Ivin Booty will keep [place] updated. Ivin Booty had no concerns at the end of the call.

## 2020-11-18 NOTE — Telephone Encounter (Signed)
Spoke with Ivin Booty, she states that 2 days ago the patient developed a dry persistent cough. She states that patient was coughing really bad last night to the point where he was sweating so she had to change the sheets. Denies any fever. Non-productive cough. Pt reports that his chest hurts from all of the coughing. Pt had a cardiology appt today, per Ivin Booty the provider did not hear any fluid on the patient's lungs. Patient is still taking Lasix 20 ng BID and Aldactone 50 mg daily. Ivin Booty states that she has been giving the patient Diabetic tussin, she states that he is taking about 1 bottle every 2 days. Pt has not had any new foods. They are worried that the coughing will rupture his esophageal varices. Please advise, thanks.

## 2021-01-12 ENCOUNTER — Other Ambulatory Visit: Payer: Self-pay

## 2021-01-12 DIAGNOSIS — K7460 Unspecified cirrhosis of liver: Secondary | ICD-10-CM

## 2021-01-12 DIAGNOSIS — I8510 Secondary esophageal varices without bleeding: Secondary | ICD-10-CM

## 2021-01-12 DIAGNOSIS — R188 Other ascites: Secondary | ICD-10-CM

## 2021-01-12 NOTE — Progress Notes (Signed)
Patient needs repeat EGD at [facility] for esophageal varices, cirrhosis

## 2021-01-13 ENCOUNTER — Telehealth: Payer: Self-pay

## 2021-01-13 DIAGNOSIS — K7460 Unspecified cirrhosis of liver: Secondary | ICD-10-CM

## 2021-01-13 DIAGNOSIS — R188 Other ascites: Secondary | ICD-10-CM

## 2021-01-13 NOTE — Telephone Encounter (Signed)
Called patient to offer him an earlier date then Jan 2023 Dr. Doyne Keel next opening: Dr. Hilarie Fredrickson has an opening on 02-10-21 at 9:30 am. Spoke to patient's wife.  She let me know that the patient had a fall on 10-7. Hurt his back and hit his head. They went to the [place] at [facility] for evaluation.He had imaging and was found to have Wedge Compression Fracture of T11-T12 Vertebra. Records in [place]. Several days later he was acting "loopy" and she took him to [place] to [facility] and they said he may have had a TIA due to the fall.  He is home and doing fine but will be fitted for a brace to protect his back going forward.  FYI - Dr. Marjo Bicker at the [facility] has retired and he has an appointment to establish with a new PCP but not until December.

## 2021-01-13 NOTE — Telephone Encounter (Signed)
Spoke with patient's wife.  She said as far as she knew January 3 would be fine to set up the EGD at [facility].

## 2021-01-13 NOTE — Telephone Encounter (Signed)
Called and left message for patient to call back to discuss being scheduled for next Egd at [facility] with Dr. Havery Moros for esophageal varices, Tuesday,  January 3rd.

## 2021-01-14 NOTE — Addendum Note (Signed)
Addended by: Roetta Sessions on: 01/14/2021 09:13 AM   Modules accepted: Orders

## 2021-01-14 NOTE — Telephone Encounter (Signed)
Called and spoke to patient's wife, Corey Wood.  He is taking aspirin but is not on a blood thinner.  Moved his EGD to Dr. Hilarie Fredrickson on 11-15th at 9:30am.  Instructions sent to patient's MyChart and mailed.  Patient knows to call with any questions or if his condition changes.

## 2021-01-14 NOTE — Telephone Encounter (Signed)
Thanks Jan. Sorry to hear this. Can you clarify if they put him on anti antiplatelet therapy like plavix for possible TIA, or anticoagulation? He is high risk for bleeding. EGD in November is almost a month away, I would hope he has recovered by then to do a procedure. If he thinks he will need more time or prefer to do it with me in January that is okay, but it's a bit father out for banding of his varices than I would prefer. Thanks

## 2021-01-28 ENCOUNTER — Telehealth: Payer: Self-pay | Admitting: Gastroenterology

## 2021-01-28 NOTE — Telephone Encounter (Signed)
Spoke with patient's wife in regards to recommendations. Pt's wife states that she will have to check to make sure he has that prescription when she gets back home. She will call us if she can't find the prescription. Pt's wife had no concerns at the end of the call.

## 2021-01-28 NOTE — Telephone Encounter (Signed)
Returned call to patient's wife, she reports that patient has been having episodes of vomiting for the last 2 weeks. She reports that patient will take about 3 bites of his meal then he will vomit and finish his meal. She states that this happens with every meal. She reports that he is on a regular diet. Denies any nausea, abdominal pain, or fever. She states that patient was evaluated by his new PCP, Dr. Silas Flood at the [facility] and was told to contact us. Pt's wife states that he is taking Protonix 40 mg daily and states that he has not been taking Zofran. Advised that he could try taking it before his meals to see if that helps. Wife wants to make sure it is ok to proceed with his EGD that is scheduled with Dr. Hilarie Fredrickson on 02/10/21. Please advise, thanks.

## 2021-01-28 NOTE — Telephone Encounter (Signed)
Patients wife called said she wanted to advise our office that the patient was seen yesterday by the PCP and he is having episodes of vomiting seeking advise since he has an upcoming procedure scheduled.

## 2021-01-28 NOTE — Telephone Encounter (Signed)
Sorry hear this.  Not sure what is driving this process, unclear if he may have some underlying gastroparesis.  He should eat his meals very slowly.  He should be taking his Zofran few times a day to see if that will help.  Yes we should definitely proceed with an EGD as scheduled on 11/15 in light of the symptoms.  If the Zofran is not helping this at all we can consider other options.  Thanks

## 2021-01-30 ENCOUNTER — Encounter (HOSPITAL_COMMUNITY): Payer: Self-pay | Admitting: Internal Medicine

## 2021-02-10 ENCOUNTER — Encounter: Payer: Self-pay | Admitting: Gastroenterology

## 2021-02-10 ENCOUNTER — Encounter (HOSPITAL_COMMUNITY)
Admission: RE | Disposition: A | Discharge status: Home / Self Care | Payer: Self-pay | Source: Home / Self Care | Attending: Internal Medicine

## 2021-02-10 ENCOUNTER — Encounter (HOSPITAL_COMMUNITY): Payer: Self-pay | Admitting: Internal Medicine

## 2021-02-10 ENCOUNTER — Telehealth: Payer: Self-pay

## 2021-02-10 ENCOUNTER — Ambulatory Visit (HOSPITAL_COMMUNITY)
Admission: RE | Admit: 2021-02-10 | Discharge: 2021-02-10 | Disposition: A | Discharge status: Home / Self Care | Payer: No Typology Code available for payment source | Attending: Internal Medicine | Admitting: Internal Medicine

## 2021-02-10 ENCOUNTER — Ambulatory Visit (HOSPITAL_COMMUNITY): Payer: No Typology Code available for payment source | Admitting: Anesthesiology

## 2021-02-10 ENCOUNTER — Other Ambulatory Visit: Payer: Self-pay

## 2021-02-10 DIAGNOSIS — I8501 Esophageal varices with bleeding: Secondary | ICD-10-CM

## 2021-02-10 DIAGNOSIS — K3189 Other diseases of stomach and duodenum: Secondary | ICD-10-CM | POA: Diagnosis not present

## 2021-02-10 DIAGNOSIS — K766 Portal hypertension: Secondary | ICD-10-CM

## 2021-02-10 DIAGNOSIS — K7460 Unspecified cirrhosis of liver: Secondary | ICD-10-CM | POA: Diagnosis not present

## 2021-02-10 DIAGNOSIS — I8510 Secondary esophageal varices without bleeding: Secondary | ICD-10-CM | POA: Diagnosis not present

## 2021-02-10 DIAGNOSIS — R188 Other ascites: Secondary | ICD-10-CM

## 2021-02-10 HISTORY — PX: ESOPHAGOGASTRODUODENOSCOPY (EGD) WITH PROPOFOL: SHX5813

## 2021-02-10 HISTORY — PX: ESOPHAGEAL BANDING: SHX5518

## 2021-02-10 LAB — GLUCOSE, CAPILLARY: Glucose-Capillary: 129 mg/dL — ABNORMAL HIGH (ref 70–99)

## 2021-02-10 SURGERY — ESOPHAGOGASTRODUODENOSCOPY (EGD) WITH PROPOFOL
Anesthesia: Monitor Anesthesia Care

## 2021-02-10 MED ORDER — LACTATED RINGERS IV SOLN
INTRAVENOUS | Status: DC
  Administered 2021-02-10: 1000 mL via INTRAVENOUS

## 2021-02-10 MED ORDER — PHENYLEPHRINE HCL (PRESSORS) 10 MG/ML IV SOLN
INTRAVENOUS | Status: DC | PRN
  Administered 2021-02-10 (×4): 120 ug via INTRAVENOUS

## 2021-02-10 MED ORDER — PROPOFOL 1000 MG/100ML IV EMUL
INTRAVENOUS | Status: AC
  Filled 2021-02-10: qty 100

## 2021-02-10 MED ORDER — PROPOFOL 500 MG/50ML IV EMUL
INTRAVENOUS | Status: DC | PRN
  Administered 2021-02-10: 200 ug/kg/min via INTRAVENOUS

## 2021-02-10 MED ORDER — GLYCOPYRROLATE 0.2 MG/ML IJ SOLN
INTRAMUSCULAR | Status: DC | PRN
  Administered 2021-02-10: .1 mg via INTRAVENOUS

## 2021-02-10 MED ORDER — LIDOCAINE HCL (CARDIAC) PF 100 MG/5ML IV SOSY
PREFILLED_SYRINGE | INTRAVENOUS | Status: DC | PRN
  Administered 2021-02-10: 75 mg via INTRAVENOUS

## 2021-02-10 SURGICAL SUPPLY — 15 items

## 2021-02-10 NOTE — Anesthesia Postprocedure Evaluation (Signed)
Anesthesia Post Note  Patient: Corey Wood  Procedure(s) Performed: ESOPHAGOGASTRODUODENOSCOPY (EGD) WITH PROPOFOL ESOPHAGEAL BANDING     Patient location during evaluation: Endoscopy Anesthesia Type: MAC Level of consciousness: awake and alert Pain management: pain level controlled Vital Signs Assessment: post-procedure vital signs reviewed and stable Respiratory status: spontaneous breathing, nonlabored ventilation, respiratory function stable and patient connected to nasal cannula oxygen Cardiovascular status: stable and blood pressure returned to baseline Postop Assessment: no apparent nausea or vomiting Anesthetic complications: no   No notable events documented.  Last Vitals:  Vitals:   02/10/21 0951 02/10/21 0956  BP: 118/70   Pulse: 69 73  Resp: 18 20  Temp:    SpO2: 92% 94%    Last Pain:  Vitals:   02/10/21 0956  TempSrc:   PainSc: 0-No pain                 Corey Wood

## 2021-02-10 NOTE — Anesthesia Procedure Notes (Signed)
Procedure Name: MAC Date/Time: 02/10/2021 9:05 AM Performed by: Lissa Morales, CRNA Pre-anesthesia Checklist: Patient identified, Emergency Drugs available and Suction available Patient Re-evaluated:Patient Re-evaluated prior to induction Oxygen Delivery Method: Simple face mask Placement Confirmation: positive ETCO2

## 2021-02-10 NOTE — Progress Notes (Signed)
Patient to be scheduled for EGD with banding of varices at [facility] on 03-31-21 with Dr. Havery Moros

## 2021-02-10 NOTE — Discharge Instructions (Signed)
YOU HAD AN ENDOSCOPIC PROCEDURE TODAY: Refer to the procedure report and other information in the discharge instructions given to you for any specific questions about what was found during the examination. If this information does not answer your questions, please call [place] office at [phone] to clarify.  ° °YOU SHOULD EXPECT: Some feelings of bloating in the abdomen. Passage of more gas than usual. Walking can help get rid of the air that was put into your GI tract during the procedure and reduce the bloating.  ° °DIET: Your first meal following the procedure should be a light meal and then it is ok to progress to your normal diet. A half-sandwich or bowl of soup is an example of a good first meal. Heavy or fried foods are harder to digest and may make you feel nauseous or bloated. Drink plenty of fluids but you should avoid alcoholic beverages for 24 hours. ° °ACTIVITY: Your care partner should take you home directly after the procedure. You should plan to take it easy, moving slowly for the rest of the day. You can resume normal activity the day after the procedure however YOU SHOULD NOT DRIVE, use power tools, machinery or perform tasks that involve climbing or major physical exertion for 24 hours (because of the sedation medicines used during the test).  ° °SYMPTOMS TO REPORT IMMEDIATELY: °A gastroenterologist can be reached at any hour. Please call [phone]  for any of the following symptoms:  ° °Following upper endoscopy (EGD, EUS, ERCP, esophageal dilation) °Vomiting of blood or coffee ground material  °New, significant abdominal pain  °New, significant chest pain or pain under the shoulder blades  °Painful or persistently difficult swallowing  °New shortness of breath  °Black, tarry-looking or red, bloody stools ° °FOLLOW UP:  °If any biopsies were taken you will be contacted by phone or by letter within the next 1-3 weeks. Call [phone]  if you have not heard about the biopsies in 3 weeks.    °Please also call with any specific questions about appointments or follow up tests. ° °

## 2021-02-10 NOTE — Telephone Encounter (Signed)
Called and spoke to patient's wife, Ivin Booty. They are available for him to have his next EGD at [facility] on 03-31-21 at 7:30am and can come for an OV next Tuesday, 11-22 at 3:20pm to discuss his nausea and vomiting. She indicates that patient's oxygen level was very low during the procedure and Dr. Hilarie Fredrickson indicated  he should be on O2.  I encouraged her to follow up with his PCP at the [facility] which she indicated she has but will discuss with Dr. Havery Moros next week that they may need Dr. Hilarie Fredrickson to send something substantiating what happened during the EGD today.

## 2021-02-10 NOTE — Telephone Encounter (Signed)
-----   Message from Yetta Flock, MD sent at 02/10/2021 12:43 PM EST ----- Thanks Ulice Dash, appreciate your help with him.   Yes I think a repeat in 2-3 months is reasonable, Jan if you can update him  on the hospital list and maybe book him in January if that is when my next opening is.   Jan can you also get him a follow up with me in the office about his vomiting issues recently so we can discuss options. Thanks  ----- Message ----- From: Jerene Bears, MD Sent: 02/10/2021  12:07 PM EST To: Yetta Flock, MD, Roetta Sessions, CMA  Tiburcio Bash again, but much better looking.  I bet he won't need bands next time. Leave to you to rec next EGD interval, but 8-12 weeks?? His wife has been unable by phone and I have tried multiple times to get her, her # straight to voicemail. She did tell the wife while I was in another case that he's been vomiting daily.  He played this down when I asked him directly. Apparently home zofran not helping much. Hopefully he will not retch in the next several days when bands dislodge.  Just fyi about next egd and his report of more vomiting recently. Thanks [employer]

## 2021-02-10 NOTE — Anesthesia Preprocedure Evaluation (Signed)
Anesthesia Evaluation  Patient identified by MRN, date of birth, ID band Patient awake    Reviewed: Allergy & Precautions, NPO status , Patient's Chart, lab work & pertinent test results  Airway Mallampati: II  TM Distance: >3 FB Neck ROM: Full    Dental no notable dental hx.    Pulmonary asthma , sleep apnea , COPD,  COPD inhaler,    Pulmonary exam normal        Cardiovascular hypertension, Pt. on medications  Rhythm:Regular Rate:Normal     Neuro/Psych Anxiety Depression Parkinson's disease  Neuromuscular disease    GI/Hepatic GERD  Medicated,(+) Cirrhosis   Esophageal Varices    ,   Endo/Other  diabetes, Type 2, Insulin DependentHypothyroidism   Renal/GU CRFRenal disease  negative genitourinary   Musculoskeletal  (+) Arthritis , Osteoarthritis,    Abdominal Normal abdominal exam  (+)   Peds  Hematology  (+) anemia ,   Anesthesia Other Findings   Reproductive/Obstetrics                             Anesthesia Physical Anesthesia Plan  ASA: 3  Anesthesia Plan: MAC   Post-op Pain Management:    Induction: Intravenous  PONV Risk Score and Plan: 1 and Propofol infusion and Treatment may vary due to age or medical condition  Airway Management Planned: Simple Face Mask, Natural Airway and Nasal Cannula  Additional Equipment: None  Intra-op Plan:   Post-operative Plan:   Informed Consent: I have reviewed the patients History and Physical, chart, labs and discussed the procedure including the risks, benefits and alternatives for the proposed anesthesia with the patient or authorized representative who has indicated his/her understanding and acceptance.     Dental advisory given  Plan Discussed with: CRNA  Anesthesia Plan Comments:         Anesthesia Quick Evaluation

## 2021-02-10 NOTE — Transfer of Care (Signed)
Immediate Anesthesia Transfer of Care Note  Patient: Corey Wood  Procedure(s) Performed: ESOPHAGOGASTRODUODENOSCOPY (EGD) WITH PROPOFOL ESOPHAGEAL BANDING  Patient Location: PACU  Anesthesia Type:MAC  Level of Consciousness: sedated and patient cooperative  Airway & Oxygen Therapy: Patient Spontanous Breathing and Patient connected to face mask oxygen  Post-op Assessment: Report given to RN, Post -op Vital signs reviewed and stable and Patient moving all extremities X 4  Post vital signs: stable  Last Vitals:  Vitals Value Taken Time  BP 106/62 02/10/21 0933  Temp 37.1 C 02/10/21 0933  Pulse 72 02/10/21 0935  Resp 26 02/10/21 0935  SpO2 100 % 02/10/21 0935  Vitals shown include unvalidated device data.  Last Pain:  Vitals:   02/10/21 0933  TempSrc: Oral  PainSc:          Complications: No notable events documented.

## 2021-02-10 NOTE — H&P (Signed)
GASTROENTEROLOGY PROCEDURE H&P NOTE   Primary Care Physician: [name], MD    Reason for Procedure:  Cirrhosis with esophageal varices from portal hypertension  Plan:    EGD with possible EVL  Patient is appropriate for endoscopic procedure(s) in the ambulatory ([place]) setting.  The nature of the procedure, as well as the risks, benefits, and alternatives were carefully and thoroughly reviewed with the patient. Ample time for discussion and questions allowed. The patient understood, was satisfied, and agreed to proceed.     HPI: Corey Wood is a 70 y.o. male who presents for upper endoscopy for banding protocol in the setting of cirrhosis with portal hypertension and known esophageal varices.  Multiple prior EGDs with esophageal band ligation last on 11/10/2020 with placement of 6 bands at that time.  Total rubber bands to date 29.  He has been intolerant to beta-blockade and not interested in TIPS.  No specific complaint today including chest pain, shortness of breath or abdominal pain.  Stools are dark on oral iron but no definitive melena or rectal bleeding.  Past Medical History:  Diagnosis Date   Allergy    seasonal allergies   Anxiety    on meds   Aortic valve disorder    Arthritis    generalized (fingers)(shoulders)   Asthma    uses inhaler   Cataract    bilateral -sx    Cirrhosis (HCC)    CKD (chronic kidney disease), stage III (HCC)    only has one kidney   COPD (chronic obstructive pulmonary disease) (HCC)    Depression    on meds   DM (diabetes mellitus) ([place])    on meds   GERD (gastroesophageal reflux disease)    on meds   Heart murmur    History of colon polyps    History of COVID-19 10/14/2020   Hx of acute pancreatitis 10/2019   Hyperlipidemia    on meds   Hypertension    on meds   Hypothyroidism    not on meds at this time   Myelodysplastic syndrome ([place])    Neuromuscular disorder ([place])    per pt   Obstructive sleep apnea     Oxygen deficiency    Parkinsonism ([place])    per pt report   Peripheral edema    bilateral legs   Peripheral positional vertigo    Sleep apnea    No CPAP    Past Surgical History:  Procedure Laterality Date   ANKLE SURGERY     CHOLECYSTECTOMY     ENDARTERECTOMY Left 04/29/2020   Procedure: CAROTID EXPLORATION removal of  central venous catheter;  Surgeon: Rosetta Posner, MD;  Location: [facility] OR;  Service: Vascular;  Laterality: Left;   ESOPHAGEAL BANDING N/A 04/17/2020   Procedure: ESOPHAGEAL BANDING;  Surgeon: Yetta Flock, MD;  Location: [facility]COPY;  Service: Gastroenterology;  Laterality: N/A;   ESOPHAGEAL BANDING  04/29/2020   Procedure: ESOPHAGEAL BANDING;  Surgeon: Rush Landmark Telford Nab., MD;  Location: [place];  Service: Gastroenterology;;   ESOPHAGEAL BANDING N/A 06/10/2020   Procedure: ESOPHAGEAL BANDING;  Surgeon: Yetta Flock, MD;  Location: [facility];  Service: Gastroenterology;  Laterality: N/A;   ESOPHAGEAL BANDING N/A 11/10/2020   Procedure: ESOPHAGEAL BANDING;  Surgeon: Yetta Flock, MD;  Location: [facility];  Service: Gastroenterology;  Laterality: N/A;   ESOPHAGOGASTRODUODENOSCOPY N/A 04/25/2020   Procedure: ESOPHAGOGASTRODUODENOSCOPY (EGD);  Surgeon: Carol Ada, MD;  Location: [facility];  Service: Endoscopy;  Laterality: N/A;   ESOPHAGOGASTRODUODENOSCOPY (  EGD) WITH PROPOFOL N/A 04/17/2020   Procedure: ESOPHAGOGASTRODUODENOSCOPY (EGD) WITH PROPOFOL;  Surgeon: Yetta Flock, MD;  Location: [facility];  Service: Gastroenterology;  Laterality: N/A;   ESOPHAGOGASTRODUODENOSCOPY (EGD) WITH PROPOFOL N/A 04/29/2020   Procedure: ESOPHAGOGASTRODUODENOSCOPY (EGD) WITH PROPOFOL;  Surgeon: Rush Landmark Telford Nab., MD;  Location: [place];  Service: Gastroenterology;  Laterality: N/A;   ESOPHAGOGASTRODUODENOSCOPY (EGD) WITH PROPOFOL N/A 06/10/2020   Procedure: ESOPHAGOGASTRODUODENOSCOPY (EGD) WITH PROPOFOL;  Surgeon: Yetta Flock, MD;   Location: [facility];  Service: Gastroenterology;  Laterality: N/A;   ESOPHAGOGASTRODUODENOSCOPY (EGD) WITH PROPOFOL N/A 11/10/2020   Procedure: ESOPHAGOGASTRODUODENOSCOPY (EGD) WITH PROPOFOL;  Surgeon: Yetta Flock, MD;  Location: [facility];  Service: Gastroenterology;  Laterality: N/A;   EYE SURGERY     HERNIA REPAIR     IR PARACENTESIS  09/18/2020   KIDNEY STONE SURGERY     WISDOM TOOTH EXTRACTION      Prior to Admission medications   Medication Sig Start Date End Date Taking? Authorizing Provider  acetaminophen (TYLENOL) 500 MG tablet Take 1,000 mg by mouth every 6 (six) hours as needed for mild pain.   Yes [provider]  albuterol (VENTOLIN HFA) 108 (90 Base) MCG/ACT inhaler Inhale 1-2 puffs into the lungs every 6 (six) hours as needed for wheezing or shortness of breath.   Yes [provider]  ARTIFICIAL SALIVA MT Use as directed 1 spray in the mouth or throat daily as needed (Dry mouth). 10/26/20  Yes [provider]  atorvastatin (LIPITOR) 40 MG tablet Take 40 mg by mouth at bedtime.   Yes [provider]  buPROPion (WELLBUTRIN XL) 150 MG 24 hr tablet Take 150 mg by mouth daily.   Yes [provider]  citalopram (CELEXA) 40 MG tablet Take 40 mg by mouth daily.   Yes [provider]  ferrous sulfate 325 (65 FE) MG tablet Take 325 mg by mouth every Monday, Wednesday, and Friday.   Yes [provider]  fluticasone (FLONASE) 50 MCG/ACT nasal spray Place 1 spray into both nostrils daily as needed for allergies or rhinitis.   Yes [provider]  furosemide (LASIX) 20 MG tablet Take 20 mg by mouth daily with breakfast.   Yes [provider]  insulin glargine (LANTUS) 100 unit/mL SOPN Inject 20 Units into the skin daily.   Yes [provider]  ipratropium (ATROVENT) 0.03 % nasal spray Place 1 spray into both nostrils at bedtime.   Yes [provider]  latanoprost (XALATAN) 0.005 %  ophthalmic solution Place 1 drop into both eyes at bedtime. 08/20/20  Yes [provider]  lidocaine (LIDODERM) 5 % Place 1 patch onto the skin daily as needed (pain). Remove & Discard patch within 12 hours or as directed by MD   Yes [provider]  loratadine (CLARITIN) 10 MG tablet Take 10 mg by mouth daily. 09/04/20  Yes [provider]  meclizine (ANTIVERT) 25 MG tablet Take 25 mg by mouth 2 (two) times daily as needed for dizziness.   Yes [provider]  montelukast (SINGULAIR) 10 MG tablet Take 10 mg by mouth daily. 05/22/20  Yes [provider]  Multiple Vitamin (MULTIVITAMIN WITH MINERALS) TABS tablet Take 1 tablet by mouth daily.   Yes [provider]  ondansetron (ZOFRAN-ODT) 4 MG disintegrating tablet Take 4 mg by mouth every 8 (eight) hours as needed for nausea or vomiting. 10/13/20  Yes [provider]  pantoprazole (PROTONIX) 40 MG tablet Take 40 mg by mouth  daily with breakfast.   Yes [provider]  spironolactone (ALDACTONE) 100 MG tablet Take 50 mg by mouth daily.   Yes [provider]  tamsulosin (FLOMAX) 0.4 MG CAPS capsule Take 0.4 mg by mouth 2 (two) times daily.   Yes [provider]  traZODone (DESYREL) 150 MG tablet Take 75 mg by mouth at bedtime. 09/28/19  Yes [provider]    Current Facility-Administered Medications  Medication Dose Route Frequency Provider Last Rate Last Admin   lactated ringers infusion   Intravenous Continuous Caeden Foots, Lajuan Lines, MD 10 mL/hr at 02/10/21 0830 1,000 mL at 02/10/21 0830    Allergies as of 01/13/2021 - Review Complete 11/10/2020  Allergen Reaction Noted   Lisinopril Cough 07/12/2012    Family History  Problem Relation Age of Onset   Liver cancer Mother    Colon cancer Neg Hx    Stomach cancer Neg Hx    Colon polyps Neg Hx    Esophageal cancer Neg Hx    Rectal cancer Neg Hx     Social History   Socioeconomic History   Marital  status: Married    Spouse name: Not on file   Number of children: Not on file   Years of education: Not on file   Highest education level: Not on file  Occupational History   Occupation: retired  Tobacco Use   Smoking status: Never   Smokeless tobacco: Never  Vaping Use   Vaping Use: Never used  Substance and Sexual Activity   Alcohol use: Not Currently   Drug use: Not Currently   Sexual activity: Not Currently  Other Topics Concern   Not on file  Social History Narrative   Not on file   Social Determinants of Health   Financial Resource Strain: Not on file  Food Insecurity: Not on file  Transportation Needs: Not on file  Physical Activity: Not on file  Stress: Not on file  Social Connections: Not on file  Intimate Partner Violence: Not on file    Physical Exam: Vital signs in last 24 hours: @BP  (!) 124/51   Pulse 88   Temp 98.1 F (36.7 C) (Oral)   Resp 20   Ht 5\' 7"  (1.702 m)   Wt 74.8 kg   SpO2 92%   BMI 25.84 kg/m  GEN: NAD EYE: Sclerae anicteric ENT: MMM CV: Non-tachycardic Pulm: CTA b/l GI: Soft, NT/ND NEURO:  Alert & Oriented x 3   Zenovia Jarred, MD [facility]  02/10/2021 8:45 AM

## 2021-02-10 NOTE — Op Note (Signed)
[facility] Patient Name: Corey Wood Procedure Date: 02/10/2021 MRN: [id number] Attending MD: Jerene Bears , MD Date of Birth: 1950/12/12 CSN: [id number] Age: 70 Admit Type: Outpatient Procedure:                Upper GI endoscopy Indications:              For therapy of esophageal varices; known cirrhosis                            with portal HTN, multiple previous sessions for EVL                            (last 11/10/20 with 6 bands placed; total bands to                            date = 29) Providers:                Lajuan Lines. Hilarie Fredrickson, MD, Jaci Carrel, RN, Tyna Jaksch Technician Referring MD:             Carlota Raspberry. Havery Moros, MD Medicines:                Monitored Anesthesia Care Complications:            No immediate complications. Estimated Blood Loss:     Estimated blood loss: none. Procedure:                Pre-Anesthesia Assessment:                           - Prior to the procedure, a History and Physical                            was performed, and patient medications and                            allergies were reviewed. The patient's tolerance of                            previous anesthesia was also reviewed. The risks                            and benefits of the procedure and the sedation                            options and risks were discussed with the patient.                            All questions were answered, and informed consent                            was obtained. Prior Anticoagulants: The patient has  taken no previous anticoagulant or antiplatelet                            agents. ASA Grade Assessment: III - A patient with                            severe systemic disease. After reviewing the risks                            and benefits, the patient was deemed in                            satisfactory condition to undergo the procedure.                           After  obtaining informed consent, the endoscope was                            passed under direct vision. Throughout the                            procedure, the patient's blood pressure, pulse, and                            oxygen saturations were monitored continuously. The                            [id number]-[id number] ([id number]) Olympus endoscope was introduced                            through the mouth, and advanced to the second part                            of duodenum. The upper GI endoscopy was                            accomplished without difficulty. The patient                            tolerated the procedure well. Scope In: Scope Out: Findings:      Two columns of grade II, small (< 5 mm) varices with no bleeding and no       stigmata of recent bleeding were found in the lower third of the       esophagus,. No red wale signs were present. Scarring from prior       treatment was visible. Evidence of partial eradication was visible.       Three bands were successfully placed with complete eradication,       resulting in deflation of varices. There was no bleeding during and at       the end of the procedure.      Moderate portal hypertensive gastropathy was found in the entire       examined stomach.      The examined duodenum was normal. Impression:               -  Grade II and small (< 5 mm) esophageal varices                            with no bleeding and no stigmata of recent                            bleeding. Banded x 3 today with good treatment                            response. Overall excellent response to EVL with                            now small varices.                           - Portal hypertensive gastropathy. No gastric                            varices seen.                           - Normal examined duodenum.                           - No specimens collected. Moderate Sedation:      N/A Recommendation:           - Patient has a contact number available  for                            emergencies. The signs and symptoms of potential                            delayed complications were discussed with the                            patient. Return to normal activities tomorrow.                            Written discharge instructions were provided to the                            patient.                           - Resume previous diet.                           - Continue present medications.                           - Repeat upper endoscopy at appointment to be                            scheduled per Dr. Havery Moros per protocol. Procedure Code(s):        --- Professional ---  43244, Esophagogastroduodenoscopy, flexible,                            transoral; with band ligation of esophageal/gastric                            varices Diagnosis Code(s):        --- Professional ---                           I85.00, Esophageal varices without bleeding                           K76.6, Portal hypertension                           K31.89, Other diseases of stomach and duodenum CPT copyright 2019 [employer]. All rights reserved. The codes documented in this report are preliminary and upon coder review may  be revised to meet current compliance requirements. Jerene Bears, MD 02/10/2021 9:41:14 AM This report has been signed electronically. Number of Addenda: 0

## 2021-02-11 ENCOUNTER — Encounter (HOSPITAL_COMMUNITY): Payer: Self-pay | Admitting: Internal Medicine

## 2021-02-11 ENCOUNTER — Other Ambulatory Visit: Payer: Self-pay

## 2021-02-11 DIAGNOSIS — K7460 Unspecified cirrhosis of liver: Secondary | ICD-10-CM

## 2021-02-11 DIAGNOSIS — I8501 Esophageal varices with bleeding: Secondary | ICD-10-CM

## 2021-02-11 NOTE — Progress Notes (Signed)
EGD with Banding of varices at [facility] on 03-31-21

## 2021-02-12 ENCOUNTER — Telehealth: Payer: Self-pay | Admitting: Gastroenterology

## 2021-02-12 NOTE — Telephone Encounter (Signed)
MyChart message sent to patient. Dr. Havery Moros does not think patient needs procedure before January 3rd which is when he is scheduled for his next EGD at [facility]

## 2021-02-17 ENCOUNTER — Ambulatory Visit: Payer: Self-pay | Admitting: Orthopedic Surgery

## 2021-02-17 ENCOUNTER — Other Ambulatory Visit (INDEPENDENT_AMBULATORY_CARE_PROVIDER_SITE_OTHER): Payer: No Typology Code available for payment source

## 2021-02-17 ENCOUNTER — Encounter: Payer: Self-pay | Admitting: Gastroenterology

## 2021-02-17 ENCOUNTER — Ambulatory Visit (INDEPENDENT_AMBULATORY_CARE_PROVIDER_SITE_OTHER): Payer: No Typology Code available for payment source | Admitting: Gastroenterology

## 2021-02-17 VITALS — BP 110/62 | HR 98 | Ht 67.0 in | Wt 166.6 lb

## 2021-02-17 DIAGNOSIS — I8501 Esophageal varices with bleeding: Secondary | ICD-10-CM

## 2021-02-17 DIAGNOSIS — K7460 Unspecified cirrhosis of liver: Secondary | ICD-10-CM

## 2021-02-17 DIAGNOSIS — R1111 Vomiting without nausea: Secondary | ICD-10-CM

## 2021-02-17 DIAGNOSIS — Z01818 Encounter for other preprocedural examination: Secondary | ICD-10-CM

## 2021-02-17 LAB — CBC WITH DIFFERENTIAL/PLATELET
Basophils Absolute: 0 10*3/uL (ref 0.0–0.1)
Basophils Relative: 0.6 % (ref 0.0–3.0)
Eosinophils Absolute: 0.1 10*3/uL (ref 0.0–0.7)
Eosinophils Relative: 5.5 % — ABNORMAL HIGH (ref 0.0–5.0)
HCT: 28 % — ABNORMAL LOW (ref 39.0–52.0)
Hemoglobin: 9.5 g/dL — ABNORMAL LOW (ref 13.0–17.0)
Lymphocytes Relative: 16.5 % (ref 12.0–46.0)
Lymphs Abs: 0.4 10*3/uL — ABNORMAL LOW (ref 0.7–4.0)
MCHC: 34 g/dL (ref 30.0–36.0)
MCV: 89.9 fl (ref 78.0–100.0)
Monocytes Absolute: 0.2 10*3/uL (ref 0.1–1.0)
Monocytes Relative: 8.2 % (ref 3.0–12.0)
Neutro Abs: 1.9 10*3/uL (ref 1.4–7.7)
Neutrophils Relative %: 69.2 % (ref 43.0–77.0)
Platelets: 98 10*3/uL — ABNORMAL LOW (ref 150.0–400.0)
RBC: 3.11 Mil/uL — ABNORMAL LOW (ref 4.22–5.81)
RDW: 18.4 % — ABNORMAL HIGH (ref 11.5–15.5)
WBC: 2.7 10*3/uL — ABNORMAL LOW (ref 4.0–10.5)

## 2021-02-17 LAB — COMPREHENSIVE METABOLIC PANEL
ALT: 18 U/L (ref 0–53)
AST: 24 U/L (ref 0–37)
Albumin: 3.4 g/dL — ABNORMAL LOW (ref 3.5–5.2)
Alkaline Phosphatase: 120 U/L — ABNORMAL HIGH (ref 39–117)
BUN: 25 mg/dL — ABNORMAL HIGH (ref 6–23)
CO2: 27 mEq/L (ref 19–32)
Calcium: 9.4 mg/dL (ref 8.4–10.5)
Chloride: 101 mEq/L (ref 96–112)
Creatinine, Ser: 1.75 mg/dL — ABNORMAL HIGH (ref 0.40–1.50)
GFR: 38.39 mL/min — ABNORMAL LOW (ref >60.00)
Glucose, Bld: 197 mg/dL — ABNORMAL HIGH (ref 70–99)
Potassium: 4.3 mEq/L (ref 3.5–5.1)
Sodium: 135 mEq/L (ref 135–145)
Total Bilirubin: 0.7 mg/dL (ref 0.2–1.2)
Total Protein: 8.1 g/dL (ref 6.0–8.3)

## 2021-02-17 LAB — PROTIME-INR
INR: 1.4 ratio — ABNORMAL HIGH (ref 0.8–1.0)
Prothrombin Time: 14.7 s — ABNORMAL HIGH (ref 9.6–13.1)

## 2021-02-17 MED ORDER — LACTULOSE 10 GM/15ML PO SOLN: 10.0000 g | Freq: Every day | ORAL | 2 refills | Status: DC

## 2021-02-17 NOTE — Patient Instructions (Signed)
If you are age 72 or older, your body mass index should be between 23-30. Your Body mass index is 26.09 kg/m. If this is out of the aforementioned range listed, please consider follow up with your Primary Care Provider.  If you are age 69 or younger, your body mass index should be between 19-25. Your Body mass index is 26.09 kg/m. If this is out of the aformentioned range listed, please consider follow up with your Primary Care Provider.   ________________________________________________________  The [facility] providers would like to encourage you to use [facility] to communicate with providers for non-urgent requests or questions.  Due to long hold times on the telephone, sending your provider a message by [facility] may be a faster and more efficient way to get a response.  Please allow 48 business hours for a response.  Please remember that this is for non-urgent requests.  _______________________________________________________  Your provider has requested that you go to the basement level for lab work before leaving today. Press "B" on the elevator. The lab is located at the first door on the left as you exit the elevator.  We have sent the following medications to your pharmacy for you to pick up at your convenience:  Lactulose  You will be contacted by [facility] in the next 2 days to arrange an abdominal ultrasound.  The number on your caller ID will be [phone], please answer when they call.  If you have not heard from them in 2 days please call [phone] to schedule.

## 2021-02-17 NOTE — Progress Notes (Signed)
HPI :  70 year old male with a history of cirrhosis, here for follow-up visit.   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  Established with me last November. Has been followed by hepatology with [facility] in the past, followed also by multiple subspecialists at the [facility]   He has had a complicated course since have started following him.  Recall that he had a screening EGD that showed very large esophageal varices and GOV 2 gastric varices, portal hypertensive gastritis.  Initially treated with propranolol but developed symptomatic bradycardia and this was stopped. He elected for primary therapy of large varices to be done with band ligation in this light. On 04/17/20 he underwent an EGD with me at the hospital , 11 bands placed and he did well. Unfortunately about a week later he presented with hematemesis and got admitted. EGD 1/28 per Dr. Benson Norway showed large varices with post banding ulcers but no stigmata of bleeding noted. He was observed for a few days on ocrteotide and PPI and discharged.  Unfortunately he represented again a few days later with again large-volume hematemesis and symptomatic anemia.  Another EGD, this time performed by Dr. Rush Landmark, showed large esophageal varices with an oozing post banding ulcer.  6 additional bands were placed.  He was in the hospital for a few days for observation.  Since we have started his variceal banding process, he is in total of 32 bands placed over multiple EGDs.  His last exam was done with Dr. Hilarie Fredrickson on November 15, varices continued to improve in size although had some medium sized varices and 3 additional bands were placed.  He tolerated it well.  He has tolerated all banding's well since the first one that bled.    While he is doing better from that perspective and has not had any bleeding in quite some time, he has not been feeling well otherwise.  He has had episodes of vomiting sporadically over the past few  months.  He denies any nausea but will have sporadic small-volume and large-volume emesis.  He does not think it is always related to eating but does have some early satiety at times and a poor appetite.  He does have some reflux symptoms that bother him.  He is moving his bowels okay without any blood.  He is taking pantoprazole and was given some Zofran to take as needed and the wife thinks that may have helped somewhat.  He does have diabetes for which he takes insulin, followed by the [place].  He is being evaluated at the [facility] to determine if he needs home oxygen, his oxygenation levels have varied from 90s to high 80s at times.  He is 91% in the office today on room air.  He has been seen by cardiology recently at the [facility] and has been referred to neurology for balance issues and falls after he had a fall and fractured his vertebrae.  Dr. Rolena Infante is performing a kyphoplasty for him on December 7.  Recall that he otherwise got admitted to [facility] in April when he had a stroke.  He has made recovery from that, is not on any antiplatelet thinners.  He is using a walker to ambulate. Wife otherwise reports that he appears somnolent and sleepy at times when he should not be.  We discussed possible hepatic encephalopathy.  He is on diuretics and tolerating well.  He has had lab work at the [place] which I cannot see, unclear when  his last lab draw was.  He is scheduled to follow-up with [facility] hepatology in January.   Prior workup: EGD 01/30/20 - Esophagogastric landmarks identified. - Large esophageal varices as described. - Erythematous mucosa in the gastric fundus and antrum, suspect portal hypertensive gastritis. Biopsies taken to rule out H pylori. - Suspected small type 2 gastroesophageal varices (GOV2, esophageal varices which extend along the fundus). - Normal duodenal bulb and second portion of the duodenum, very mild superficial erythema Noted.   Colonoscopy 01/30/20 - The perianal  and digital rectal examinations were normal. - The terminal ileum appeared normal. - A 5 to 6 mm polyp was found in the transverse colon. The polyp was flat. The polyp was removed with a cold snare. Resection and retrieval were complete. - Moderately congested mucosa was found in the entire colon, suspected due to portal hypertension. - Internal hemorrhoids were found during retroflexion. - The exam was otherwise without abnormality.   1. Surgical [P], gastric antrum and gastric body - CHRONIC GASTRITIS. - WARTHIN-STARRY IS NEGATIVE FOR HELICOBACTER PYLORI. - NO INTESTINAL METAPLASIA, DYSPLASIA, OR MALIGNANCY. 2. Surgical [P], colon, transverse, polyp - TUBULAR ADENOMA. - NO HIGH GRADE DYSPLASIA OR MALIGNANCY.     EGD 04/17/20 - Esophagogastric landmarks identified. - Large esophageal varices. Banded x 11. - Type 2 gastroesophageal varices (GOV2, esophageal varices which extend along the fundus). - Erythematous mucosa in the antrum. - Normal duodenal bulb and second portion of the duodenum.   EGD 04/25/20 - Large (> 5 mm) esophageal varices. - Esophageal ulcers with no stigmata of recent bleeding. - Portal hypertensive gastropathy. - A medium amount of food (residue) in the stomach. - Normal examined duodenum. - No specimens collected.   EGD 04/30/20 - No gross lesions in esophagus proximally. - Grade I, grade II and grade III esophageal varices as well as multiple post-banding ulcers with a few ulcers showing active oozing. Near completely eradicated after 6 bands were placed. No active extravasation noted thereafter. - Clotted blood in the entire stomach - suctioned and lavaged with mild clearance and did not see active re-accumulation of bleeding. - Type 2 gastroesophageal varices (GOV2, esophageal varices which extend along the fundus) - not completely visualized as had been at time of first endoscopy but no sing of active bleeding, but recent stigmata or nipple sign could  have been missed due to blood in fundus. - Blood in the duodenal bulb and in the second portion of the duodenum - lavaged away.   Echo 05/01/20 - EF 60-65%   EGD 06/10/20 - Esophagogastric landmarks identified. - Large esophageal varices but improved overall in size / appearance compared to initial exam. Banded x 6 in the lower to mid esophagus. - Type 2 gastroesophageal varices (GOV2, esophageal varices which extend along the fundus). - Portal hypertensive gastropathy. - Normal duodenal bulb and second portion of the duodenum.   Patient had a CVA in April, follow up EGD was cancelled   Admitted to [facility] 5/21/08/30/20 for volume overload  EGD 11/10/20 - Esophagogastric landmarks identified. - Esophageal varices as described above - improvement in size over time but columns still persist, mostly medium in size, one large column. Banded x 6. - Portal hypertensive gastropathy. - Normal duodenal bulb and second portion of the duodenum.   EGD 02/10/21 - Grade II and small (< 5 mm) esophageal varices with no bleeding and no stigmata of recent bleeding. Banded x 3 today with good treatment response. Overall excellent response to EVL  with now small varices. - Portal hypertensive gastropathy. No gastric varices seen. - Normal examined duodenum.  RUQ [place] 09/23/20 - IMPRESSION: Cirrhotic liver morphology.  No focal hepatic lesion.     Past Medical History:  Diagnosis Date   Allergy    seasonal allergies   Anxiety    on meds   Aortic valve disorder    Arthritis    generalized (fingers)(shoulders)   Asthma    uses inhaler   Cataract    bilateral -sx    Cirrhosis (HCC)    CKD (chronic kidney disease), stage III (HCC)    only has one kidney   COPD (chronic obstructive pulmonary disease) (HCC)    Depression    on meds   DM (diabetes mellitus) ([place])    on meds   GERD (gastroesophageal reflux disease)    on meds   Heart murmur    History of colon polyps    History of  COVID-19 10/14/2020   Hx of acute pancreatitis 10/2019   Hyperlipidemia    on meds   Hypertension    on meds   Hypothyroidism    not on meds at this time   Myelodysplastic syndrome ([place])    Neuromuscular disorder ([place])    per pt   Obstructive sleep apnea    Oxygen deficiency    Parkinsonism ([place])    per pt report   Peripheral edema    bilateral legs   Peripheral positional vertigo    Sleep apnea    No CPAP     Past Surgical History:  Procedure Laterality Date   ANKLE SURGERY     CHOLECYSTECTOMY     ENDARTERECTOMY Left 04/29/2020   Procedure: CAROTID EXPLORATION removal of  central venous catheter;  Surgeon: Rosetta Posner, MD;  Location: [facility] OR;  Service: Vascular;  Laterality: Left;   ESOPHAGEAL BANDING N/A 04/17/2020   Procedure: ESOPHAGEAL BANDING;  Surgeon: Yetta Flock, MD;  Location: [facility];  Service: Gastroenterology;  Laterality: N/A;   ESOPHAGEAL BANDING  04/29/2020   Procedure: ESOPHAGEAL BANDING;  Surgeon: Rush Landmark Telford Nab., MD;  Location: [place];  Service: Gastroenterology;;   ESOPHAGEAL BANDING N/A 06/10/2020   Procedure: ESOPHAGEAL BANDING;  Surgeon: Yetta Flock, MD;  Location: [facility];  Service: Gastroenterology;  Laterality: N/A;   ESOPHAGEAL BANDING N/A 11/10/2020   Procedure: ESOPHAGEAL BANDING;  Surgeon: Yetta Flock, MD;  Location: [facility];  Service: Gastroenterology;  Laterality: N/A;   ESOPHAGEAL BANDING N/A 02/10/2021   Procedure: ESOPHAGEAL BANDING;  Surgeon: Jerene Bears, MD;  Location: [facility];  Service: Gastroenterology;  Laterality: N/A;   ESOPHAGOGASTRODUODENOSCOPY N/A 04/25/2020   Procedure: ESOPHAGOGASTRODUODENOSCOPY (EGD);  Surgeon: Carol Ada, MD;  Location: [facility];  Service: Endoscopy;  Laterality: N/A;   ESOPHAGOGASTRODUODENOSCOPY (EGD) WITH PROPOFOL N/A 04/17/2020   Procedure: ESOPHAGOGASTRODUODENOSCOPY (EGD) WITH PROPOFOL;  Surgeon: Yetta Flock, MD;  Location: [facility];   Service: Gastroenterology;  Laterality: N/A;   ESOPHAGOGASTRODUODENOSCOPY (EGD) WITH PROPOFOL N/A 04/29/2020   Procedure: ESOPHAGOGASTRODUODENOSCOPY (EGD) WITH PROPOFOL;  Surgeon: Rush Landmark Telford Nab., MD;  Location: [place];  Service: Gastroenterology;  Laterality: N/A;   ESOPHAGOGASTRODUODENOSCOPY (EGD) WITH PROPOFOL N/A 06/10/2020   Procedure: ESOPHAGOGASTRODUODENOSCOPY (EGD) WITH PROPOFOL;  Surgeon: Yetta Flock, MD;  Location: [facility];  Service: Gastroenterology;  Laterality: N/A;   ESOPHAGOGASTRODUODENOSCOPY (EGD) WITH PROPOFOL N/A 11/10/2020   Procedure: ESOPHAGOGASTRODUODENOSCOPY (EGD) WITH PROPOFOL;  Surgeon: Yetta Flock, MD;  Location: [facility];  Service: Gastroenterology;  Laterality: N/A;  ESOPHAGOGASTRODUODENOSCOPY (EGD) WITH PROPOFOL N/A 02/10/2021   Procedure: ESOPHAGOGASTRODUODENOSCOPY (EGD) WITH PROPOFOL;  Surgeon: Jerene Bears, MD;  Location: [facility];  Service: Gastroenterology;  Laterality: N/A;   EYE SURGERY     HERNIA REPAIR     IR PARACENTESIS  09/18/2020   KIDNEY STONE SURGERY     WISDOM TOOTH EXTRACTION     Family History  Problem Relation Age of Onset   Liver cancer Mother    Colon cancer Neg Hx    Stomach cancer Neg Hx    Colon polyps Neg Hx    Esophageal cancer Neg Hx    Rectal cancer Neg Hx    Social History   Tobacco Use   Smoking status: Never    Passive exposure: Never   Smokeless tobacco: Never  Vaping Use   Vaping Use: Never used  Substance Use Topics   Alcohol use: Not Currently   Drug use: Not Currently   Current Outpatient Medications  Medication Sig Dispense Refill   acetaminophen (TYLENOL) 500 MG tablet Take 1,000 mg by mouth every 6 (six) hours as needed for mild pain.     albuterol (VENTOLIN HFA) 108 (90 Base) MCG/ACT inhaler Inhale 1-2 puffs into the lungs every 6 (six) hours as needed for wheezing or shortness of breath.     ARTIFICIAL SALIVA MT Use as directed 1 spray in the mouth or throat daily  as needed (Dry mouth).     atorvastatin (LIPITOR) 40 MG tablet Take 40 mg by mouth at bedtime.     buPROPion (WELLBUTRIN XL) 150 MG 24 hr tablet Take 150 mg by mouth daily.     citalopram (CELEXA) 40 MG tablet Take 40 mg by mouth daily.     ferrous sulfate 325 (65 FE) MG tablet Take 325 mg by mouth every Monday, Wednesday, and Friday.     fluticasone (FLONASE) 50 MCG/ACT nasal spray Place 1 spray into both nostrils daily as needed for allergies or rhinitis.     furosemide (LASIX) 20 MG tablet Take 20 mg by mouth daily with breakfast.     insulin glargine (LANTUS) 100 unit/mL SOPN Inject 20 Units into the skin daily.     ipratropium (ATROVENT) 0.03 % nasal spray Place 1 spray into both nostrils at bedtime.     latanoprost (XALATAN) 0.005 % ophthalmic solution Place 1 drop into both eyes at bedtime.     lidocaine (LIDODERM) 5 % Place 1 patch onto the skin daily as needed (pain). Remove & Discard patch within 12 hours or as directed by MD     loratadine (CLARITIN) 10 MG tablet Take 10 mg by mouth daily.     meclizine (ANTIVERT) 25 MG tablet Take 25 mg by mouth 2 (two) times daily as needed for dizziness.     montelukast (SINGULAIR) 10 MG tablet Take 10 mg by mouth daily.     Multiple Vitamin (MULTIVITAMIN WITH MINERALS) TABS tablet Take 1 tablet by mouth daily.     ondansetron (ZOFRAN-ODT) 4 MG disintegrating tablet Take 4 mg by mouth every 8 (eight) hours as needed for nausea or vomiting.     pantoprazole (PROTONIX) 40 MG tablet Take 40 mg by mouth daily with breakfast.     spironolactone (ALDACTONE) 100 MG tablet Take 50 mg by mouth daily.     tamsulosin (FLOMAX) 0.4 MG CAPS capsule Take 0.4 mg by mouth 2 (two) times daily.     traZODone (DESYREL) 150 MG tablet Take 75 mg by mouth at bedtime.  No current facility-administered medications for this visit.   Allergies  Allergen Reactions   Lisinopril Cough     Review of Systems: All systems reviewed and negative except where noted in  HPI.   Labs reviewed in Epic.  Physical Exam: BP 110/62   Pulse 98   Ht '5\' 7"'  (1.702 m)   Wt 166 lb 9.6 oz (75.6 kg)   SpO2 91%   BMI 26.09 kg/m  Constitutional: Pleasant,well-developed, male in no acute distress. Abdominal: Soft, nondistended, nontender.  There are no masses palpable.  Extremities: no edema Lymphadenopathy: No cervical adenopathy noted. Neurological: Alert and oriented to person place and time. Skin: Skin is warm and dry. No rashes noted. Psychiatric: Normal mood and affect. Behavior is normal.   ASSESSMENT AND PLAN: 70 year old male here for reassessment of following:  Cirrhosis Esophageal varices Vomiting  As above the patient has had a very tenuous course over the past year.  He had extremely large esophageal varices that has taken 32 bands to mostly eradicate up to this point.  He is due for surveillance endoscopy in January for reassessment.  He unfortunately did not tolerate nonselective beta-blockade.  He has had some falls recently and wife endorses some somnolence, question some subtle hepatic encephalopathy and think we should start him on some lactulose in case this is occurring.  He and wife are agreeable after discussion of this.  He is due for [facility] screening with an ultrasound next month so we will coordinate that.  He is due for some basic labs today we will send him to the lab for CBC, c-Met, INR, and AFP.  He is scheduled to see hepatology in January in [place] whom he has seen in the past.  He appears to be tolerating the diuretics and his edema appears stable.  He is following at the [facility] with cardiology and neurology, sounds like he is being evaluated for home oxygen.  Main issue that is bothering him recently has sporadic vomiting and early satiety.  Query that he may have developed some gastroparesis in light of his diabetes.  We discussed trying him on some Reglan to see if that will help versus formally evaluated with a gastric emptying study.   They would like to try Reglan but need to get it current copy of his EKG given he had a slightly prolonged QT on remote EKG.  We will reach out to his cardiologist to get that record.  Otherwise we will continue his medications for now, adding lactulose  Plan: - CBC, CMET, INR, AFP today - [place] RUQ next month - EGD in January for varices surveillance at [facility]  - starting lactulose today for suspected mild hepatic encephalopathy - will follow up with hepatology in January - obtain recent EKG and cardiology records from last month, look at QTc and see if candidate for empiric Reglan given vomiting episodes. Continue PPI and Zofran PRN for now. Nothing on EGD to cause this.  - continue to f/u every 6 months  Jolly Mango, MD [facility]

## 2021-02-18 ENCOUNTER — Other Ambulatory Visit: Payer: Self-pay

## 2021-02-18 DIAGNOSIS — R1111 Vomiting without nausea: Secondary | ICD-10-CM

## 2021-02-18 DIAGNOSIS — I8501 Esophageal varices with bleeding: Secondary | ICD-10-CM

## 2021-02-18 DIAGNOSIS — K7460 Unspecified cirrhosis of liver: Secondary | ICD-10-CM

## 2021-02-18 LAB — AFP TUMOR MARKER: AFP-Tumor Marker: 1.3 ng/mL (ref <6.1)

## 2021-02-23 ENCOUNTER — Telehealth: Payer: Self-pay | Admitting: Gastroenterology

## 2021-02-23 ENCOUNTER — Ambulatory Visit: Payer: Self-pay | Admitting: Orthopedic Surgery

## 2021-02-23 NOTE — H&P (Signed)
Subjective:   CC: Mid back pain T12 Kypho CONE 03/04/21  Patient Active Problem List   Diagnosis Date Noted   Portal hypertension ([place])    Esophageal varices without bleeding ([place])    COVID-19 10/14/2020   Pancytopenia ([place]) 10/14/2020   Hypokalemia 10/14/2020   Bleeding esophageal varices ([place])    AKI (acute kidney injury) ([place]) 04/26/2020   Acute blood loss anemia 04/26/2020   Upper GI bleed 04/26/2020   Acute GI bleeding 04/25/2020   Hepatic cirrhosis ([place])    Age-related nuclear cataract of right eye 09/15/2016   Pseudophakia of left eye 09/15/2016   Intraoperative floppy iris syndrome (IFIS) 01/13/2016   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/13/2016   Acquired color vision deficiency 11/24/2015   Nuclear sclerosis of right eye 11/24/2015   Open angle with borderline findings and high glaucoma risk in both eyes 11/24/2015   Type 2 diabetes mellitus without complication, without long-term current use of insulin ([place]) 11/24/2015   Past Medical History:  Diagnosis Date   Allergy    seasonal allergies   Anxiety    on meds   Aortic valve disorder    Arthritis    generalized (fingers)(shoulders)   Asthma    uses inhaler   Cataract    bilateral -sx    Cirrhosis (HCC)    CKD (chronic kidney disease), stage III (HCC)    only has one kidney   COPD (chronic obstructive pulmonary disease) ([place])    Depression    on meds   DM (diabetes mellitus) ([place])    on meds   GERD (gastroesophageal reflux disease)    on meds   Heart murmur    History of colon polyps    History of COVID-19 10/14/2020   Hx of acute pancreatitis 10/2019   Hyperlipidemia    on meds   Hypertension    on meds   Hypothyroidism    not on meds at this time   Myelodysplastic syndrome ([place])    Neuromuscular disorder ([place])    per pt   Obstructive sleep apnea    Oxygen deficiency    Parkinsonism ([place])    per pt report   Peripheral edema    bilateral legs   Peripheral positional vertigo    Sleep apnea    No CPAP     Past Surgical History:  Procedure Laterality Date   ANKLE SURGERY     CHOLECYSTECTOMY     ENDARTERECTOMY Left 04/29/2020   Procedure: CAROTID EXPLORATION removal of  central venous catheter;  Surgeon: Rosetta Posner, MD;  Location: [facility] OR;  Service: Vascular;  Laterality: Left;   ESOPHAGEAL BANDING N/A 04/17/2020   Procedure: ESOPHAGEAL BANDING;  Surgeon: Yetta Flock, MD;  Location: [facility];  Service: Gastroenterology;  Laterality: N/A;   ESOPHAGEAL BANDING  04/29/2020   Procedure: ESOPHAGEAL BANDING;  Surgeon: Rush Landmark Telford Nab., MD;  Location: [place];  Service: Gastroenterology;;   ESOPHAGEAL BANDING N/A 06/10/2020   Procedure: ESOPHAGEAL BANDING;  Surgeon: Yetta Flock, MD;  Location: [facility];  Service: Gastroenterology;  Laterality: N/A;   ESOPHAGEAL BANDING N/A 11/10/2020   Procedure: ESOPHAGEAL BANDING;  Surgeon: Yetta Flock, MD;  Location: [facility];  Service: Gastroenterology;  Laterality: N/A;   ESOPHAGEAL BANDING N/A 02/10/2021   Procedure: ESOPHAGEAL BANDING;  Surgeon: Jerene Bears, MD;  Location: [facility];  Service: Gastroenterology;  Laterality: N/A;   ESOPHAGOGASTRODUODENOSCOPY N/A 04/25/2020   Procedure: ESOPHAGOGASTRODUODENOSCOPY (EGD);  Surgeon: Carol Ada, MD;  Location: [facility];  Service: Endoscopy;  Laterality: N/A;   ESOPHAGOGASTRODUODENOSCOPY (EGD) WITH PROPOFOL N/A 04/17/2020   Procedure: ESOPHAGOGASTRODUODENOSCOPY (EGD) WITH PROPOFOL;  Surgeon: Yetta Flock, MD;  Location: [facility];  Service: Gastroenterology;  Laterality: N/A;   ESOPHAGOGASTRODUODENOSCOPY (EGD) WITH PROPOFOL N/A 04/29/2020   Procedure: ESOPHAGOGASTRODUODENOSCOPY (EGD) WITH PROPOFOL;  Surgeon: Rush Landmark Telford Nab., MD;  Location: [place];  Service: Gastroenterology;  Laterality: N/A;   ESOPHAGOGASTRODUODENOSCOPY (EGD) WITH PROPOFOL N/A 06/10/2020   Procedure: ESOPHAGOGASTRODUODENOSCOPY (EGD) WITH PROPOFOL;  Surgeon: Yetta Flock, MD;  [facility];  Service: Gastroenterology;  Laterality: N/A;   ESOPHAGOGASTRODUODENOSCOPY (EGD) WITH PROPOFOL N/A 11/10/2020   Procedure: ESOPHAGOGASTRODUODENOSCOPY (EGD) WITH PROPOFOL;  Surgeon: Yetta Flock, MD;  [facility];  Service: Gastroenterology;  Laterality: N/A;   ESOPHAGOGASTRODUODENOSCOPY (EGD) WITH PROPOFOL N/A 02/10/2021   Procedure: ESOPHAGOGASTRODUODENOSCOPY (EGD) WITH PROPOFOL;  Surgeon: Jerene Bears, MD;  Location: [facility];  Service: Gastroenterology;  Laterality: N/A;   EYE SURGERY     HERNIA REPAIR     IR PARACENTESIS  09/18/2020   KIDNEY STONE SURGERY     WISDOM TOOTH EXTRACTION      Current Outpatient Medications  Medication Sig Dispense Refill Last Dose   acetaminophen (TYLENOL) 500 MG tablet Take 1,000 mg by mouth every 6 (six) hours as needed for mild pain.      albuterol (VENTOLIN HFA) 108 (90 Base) MCG/ACT inhaler Inhale 1-2 puffs into the lungs every 6 (six) hours as needed for wheezing or shortness of breath.      ARTIFICIAL SALIVA MT Use as directed 1 spray in the mouth or throat daily as needed (Dry mouth).      atorvastatin (LIPITOR) 40 MG tablet Take 40 mg by mouth at bedtime.      buPROPion (WELLBUTRIN XL) 150 MG 24 hr tablet Take 150 mg by mouth daily.      citalopram (CELEXA) 40 MG tablet Take 40 mg by mouth daily.      ferrous sulfate 325 (65 FE) MG tablet Take 325 mg by mouth every Monday, Wednesday, and Friday.      fluticasone (FLONASE) 50 MCG/ACT nasal spray Place 1 spray into both nostrils daily as needed for allergies or rhinitis.      furosemide (LASIX) 20 MG tablet Take 20 mg by mouth daily with breakfast.      insulin glargine (LANTUS) 100 unit/mL SOPN Inject 20 Units into the skin daily.      ipratropium (ATROVENT) 0.03 % nasal spray Place 1 spray into both nostrils at bedtime.      lactulose (CHRONULAC) 10 GM/15ML solution Take 15 mLs (10 g total) by mouth daily. 473 mL 2    latanoprost (XALATAN)  0.005 % ophthalmic solution Place 1 drop into both eyes at bedtime.      lidocaine (LIDODERM) 5 % Place 1 patch onto the skin daily as needed (pain). Remove & Discard patch within 12 hours or as directed by MD      loratadine (CLARITIN) 10 MG tablet Take 10 mg by mouth daily.      meclizine (ANTIVERT) 25 MG tablet Take 25 mg by mouth 2 (two) times daily as needed for dizziness.      montelukast (SINGULAIR) 10 MG tablet Take 10 mg by mouth daily.      Multiple Vitamin (MULTIVITAMIN WITH MINERALS) TABS tablet Take 1 tablet by mouth daily.      ondansetron (ZOFRAN-ODT) 4 MG disintegrating tablet Take 4 mg by mouth every 8 (eight) hours as needed for nausea or  vomiting.      pantoprazole (PROTONIX) 40 MG tablet Take 40 mg by mouth daily with breakfast.      spironolactone (ALDACTONE) 100 MG tablet Take 50 mg by mouth daily.      tamsulosin (FLOMAX) 0.4 MG CAPS capsule Take 0.4 mg by mouth 2 (two) times daily.      traZODone (DESYREL) 150 MG tablet Take 75 mg by mouth at bedtime.      No current facility-administered medications for this visit.   Allergies  Allergen Reactions   Lisinopril Cough    Social History   Tobacco Use   Smoking status: Never    Passive exposure: Never   Smokeless tobacco: Never  Substance Use Topics   Alcohol use: Not Currently    Family History  Problem Relation Age of Onset   Liver cancer Mother    Colon cancer Neg Hx    Stomach cancer Neg Hx    Colon polyps Neg Hx    Esophageal cancer Neg Hx    Rectal cancer Neg Hx     Review of Systems Pertinent items are noted in HPI.  Objective:   Vitals: Ht: 5 ft 5 in 02/23/2021 09:31 am BP: 100/68 sitting L arm 02/23/2021 09:35 am Pulse: 88 bpm 02/23/2021 09:33 am O2Sat: 94% 02/23/2021 09:33 am  Clinical exam: Corey Wood is a pleasant individual, who appears younger than their stated age. He is alert and orientated 3. No shortness of breath, chest pain.  Heart: Regular rate and rhythm, no rubs, murmurs, or  gallops  Lungs: Clear to auscultation bilaterally  Abdomen is soft and non-tender, negative loss of bowel and bladder control, no rebound tenderness. Bowel sounds 4  Negative: skin lesions abrasions contusions  Peripheral pulses: 1+ dorsalis pedis/posterior tibialis pulses bilaterally. Compartment soft and nontender.  Gait pattern: Patient has been noncontributory for the last month due to severe low back pain at the thoracolumbar junction. Assistive devices: Rollator walker with a seat  Neuro: In the seated position he has 5/5 motor strength in lower extremity. Normal sensation light touch throughout. Negative Babinski test, no clonus, negative straight leg raise test.  Musculoskeletal: Significant pain at the thoracolumbar junction. Pain with direct palpation radiating into the paraspinal region. No lower lumbar pain with direct palpation. No SI joint pain.  CT scan of the lumbar spine completed at an outside institution on 01/08/21 demonstrates an acute anterior-inferior T12 compression fracture with 50% loss of anterior height. No evidence of retropulsion. Patient does have a right pleural effusion and left lower lung atelectasis.  Assessment:   Corey Wood is a very pleasant 70 year old gentleman who was in his usual state of health until one month ago when he fell from a standing height. Prior to the fall he was ambulating with a rolling walker. Since the fall he has too much lower thoracic pain to even stand and ambulate. He is essentially in a wheelchair. He has no focal motor or sensory deficits in the lower extremity but had a severe thoracic pain. Imaging studies confirm a T12 acute compression fracture. At this point time I have gone over surgical intervention which be a T12 kyphoplasty in both he and his wife would like to move forward with surgery. I have discussed the risks, and benefits and all their questions were encouraged and addressed.   Plan:   Risks of surgery include:  Infection, bleeding, death, stroke, paralysis, nerve damage, leak of cement, need for additional surgery including open decompression. Ongoing or worse pain. Given the  patient's complex medical history we did discuss that he is at an increased risk of complications. He expressed understanding of this and desired to proceed with surgery.  Goals of surgery: Reduction in pain, and improvement in quality of life  We have obtained preoperative medical clearance from his primary care doctor and cardiologist. He also recently underwent variceal banding. He previously recommended postponing his kyphoplasty until 2 weeks after this procedure. We will work on getting updated clearance on this provider as the patient will be 2 weeks out from the procedure tomorrow.  I reviewed the patient's medication list. He is not on any blood thinners. Not using aspirin. Not using any over-the-counter NSAIDs.  We have also discussed the post-operative recovery period to include: bathing/showering restrictions, wound healing, activity (and driving) restrictions, medications/pain mangement.  We have also discussed post-operative redflags to include: signs and symptoms of postoperative infection, DVT/PE.  Discharge instructions were reviewed with the patient. All of his questions were invited and answered  Follow-up: 2 weeks postop.

## 2021-02-23 NOTE — Telephone Encounter (Signed)
Error

## 2021-02-25 NOTE — Progress Notes (Signed)
Surgical Instructions    Your procedure is scheduled on 03/04/21.  Report to [facility] Main Entrance "A" at 6:30 A.M., then check in with the Admitting office.  Call this number if you have problems the morning of surgery:  [phone]   If you have any questions prior to your surgery date call [phone]: Open Monday-Friday 8am-4pm    Remember:  Do not eat after midnight the night before your surgery  You may drink clear liquids until 5:30am the morning of your surgery.   Clear liquids allowed are: Water, Non-Citrus Juices (without pulp), Carbonated Beverages, Clear Tea, Black Coffee ONLY (NO MILK, CREAM OR POWDERED CREAMER of any kind), and Gatorade.    Take these medicines the morning of surgery with A SIP OF WATER  buPROPion (WELLBUTRIN XL)  citalopram (CELEXA) loratadine (CLARITIN) montelukast (SINGULAIR)  pantoprazole (PROTONIX) tamsulosin (FLOMAX)  IF NEEDED: acetaminophen (TYLENOL)  albuterol (VENTOLIN HFA) inhaler (bring inhaler with you the day of surgery) fluticasone (FLONASE) meclizine (ANTIVERT) ondansetron (ZOFRAN-ODT)  As of today, STOP taking any Aspirin (unless otherwise instructed by your surgeon) Aleve, Naproxen, Ibuprofen, Motrin, Advil, Goody's, BC's, all herbal medications, fish oil, and all vitamins.  WHAT DO I DO ABOUT MY DIABETES MEDICATION?   Do not take oral diabetes medicines (pills) the morning of surgery.  THE MORNING OF SURGERY, take 50% (10 units) of insulin glargine (LANTUS).  The day of surgery, do not take other diabetes injectables, including Byetta (exenatide), Bydureon (exenatide ER), Victoza (liraglutide), or Trulicity (dulaglutide).  If your CBG is greater than 220 mg/dL, you may take  of your sliding scale (correction) dose of insulin.   HOW TO MANAGE YOUR DIABETES BEFORE AND AFTER SURGERY  Why is it important to control my blood sugar before and after surgery? Improving blood sugar levels before and after surgery helps  healing and can limit problems. A way of improving blood sugar control is eating a healthy diet by:  Eating less sugar and carbohydrates  Increasing activity/exercise  Talking with your doctor about reaching your blood sugar goals High blood sugars (greater than 180 mg/dL) can raise your risk of infections and slow your recovery, so you will need to focus on controlling your diabetes during the weeks before surgery. Make sure that the doctor who takes care of your diabetes knows about your planned surgery including the date and location.  How do I manage my blood sugar before surgery? Check your blood sugar at least 4 times a day, starting 2 days before surgery, to make sure that the level is not too high or low.  Check your blood sugar the morning of your surgery when you wake up and every 2 hours until you get to the Short Stay unit.  If your blood sugar is less than 70 mg/dL, you will need to treat for low blood sugar: Do not take insulin. Treat a low blood sugar (less than 70 mg/dL) with  cup of clear juice (cranberry or apple), 4 glucose tablets, OR glucose gel. Recheck blood sugar in 15 minutes after treatment (to make sure it is greater than 70 mg/dL). If your blood sugar is not greater than 70 mg/dL on recheck, call [phone] for further instructions. Report your blood sugar to the short stay nurse when you get to Short Stay.  If you are admitted to the hospital after surgery: Your blood sugar will be checked by the staff and you will probably be given insulin after surgery (instead of oral diabetes medicines) to make sure  you have good blood sugar levels. The goal for blood sugar control after surgery is 80-180 mg/dL.    After your COVID test   You are not required to quarantine however you are required to wear a well-fitting mask when you are out and around people not in your household.  If your mask becomes wet or soiled, replace with a new one.  Wash your hands often with  soap and water for 20 seconds or clean your hands with an alcohol-based hand sanitizer that contains at least 60% alcohol.  Do not share personal items.  Notify your provider: if you are in close contact with someone who has COVID  or if you develop a fever of 100.4 or greater, sneezing, cough, sore throat, shortness of breath or body aches.             Do not wear jewelry or makeup Do not wear lotions, powders, perfumes/colognes, or deodorant. Do not shave 48 hours prior to surgery.  Men may shave face and neck. Do not bring valuables to the hospital. DO Not wear nail polish, gel polish, artificial nails, or any other type of covering on natural nails including finger and toenails. If patients have artificial nails, gel coating, etc. that need to be removed by a nail salon, please have this removed prior to surgery or surgery may need to be canceled/delayed if the surgeon/ anesthesia feels like the patient is unable to be adequately monitored.             [facility] is not responsible for any belongings or valuables.  Do NOT Smoke (Tobacco/Vaping)  24 hours prior to your procedure  If you use a CPAP at night, you may bring your mask for your overnight stay.   Contacts, glasses, hearing aids, dentures or partials may not be worn into surgery, please bring cases for these belongings   For patients admitted to the hospital, discharge time will be determined by your treatment team.   Patients discharged the day of surgery will not be allowed to drive home, and someone needs to stay with them for 24 hours.  NO VISITORS WILL BE ALLOWED IN PRE-OP WHERE PATIENTS ARE PREPPED FOR SURGERY.  ONLY 1 SUPPORT PERSON MAY BE PRESENT IN THE WAITING ROOM WHILE YOU ARE IN SURGERY.  IF YOU ARE TO BE ADMITTED, ONCE YOU ARE IN YOUR ROOM YOU WILL BE ALLOWED TWO (2) VISITORS. 1 (ONE) VISITOR MAY STAY OVERNIGHT BUT MUST ARRIVE TO THE ROOM BY 8pm.  Minor children may have two parents present. Special  consideration for safety and communication needs will be reviewed on a case by case basis.  Special instructions:    Oral Hygiene is also important to reduce your risk of infection.  Remember - BRUSH YOUR TEETH THE MORNING OF SURGERY WITH YOUR REGULAR TOOTHPASTE   - Preparing For Surgery  Before surgery, you can play an important role. Because skin is not sterile, your skin needs to be as free of germs as possible. You can reduce the number of germs on your skin by washing with CHG (chlorahexidine gluconate) Soap before surgery.  CHG is an antiseptic cleaner which kills germs and bonds with the skin to continue killing germs even after washing.     Please do not use if you have an allergy to CHG or antibacterial soaps. If your skin becomes reddened/irritated stop using the CHG.  Do not shave (including legs and underarms) for at least 48 hours prior to first  CHG shower. It is OK to shave your face.  Please follow these instructions carefully.     Shower the NIGHT BEFORE SURGERY and the MORNING OF SURGERY with CHG Soap.   If you chose to wash your hair, wash your hair first as usual with your normal shampoo. After you shampoo, rinse your hair and body thoroughly to remove the shampoo.  Then [employer] and genitals (private parts) with your normal soap and rinse thoroughly to remove soap.  After that Use CHG Soap as you would any other liquid soap. You can apply CHG directly to the skin and wash gently with a scrungie or a clean washcloth.   Apply the CHG Soap to your body ONLY FROM THE NECK DOWN.  Do not use on open wounds or open sores. Avoid contact with your eyes, ears, mouth and genitals (private parts). Wash Face and genitals (private parts)  with your normal soap.   Wash thoroughly, paying special attention to the area where your surgery will be performed.  Thoroughly rinse your body with warm water from the neck down.  DO NOT shower/wash with your normal soap after using  and rinsing off the CHG Soap.  Pat yourself dry with a CLEAN TOWEL.  Wear CLEAN PAJAMAS to bed the night before surgery  Place CLEAN SHEETS on your bed the night before your surgery  DO NOT SLEEP WITH PETS.   Day of Surgery:  Take a shower with CHG soap. Wear Clean/Comfortable clothing the morning of surgery Do not apply any deodorants/lotions.   Remember to brush your teeth WITH YOUR REGULAR TOOTHPASTE.   Please read over the following fact sheets that you were given.

## 2021-02-26 ENCOUNTER — Telehealth: Payer: Self-pay

## 2021-02-26 ENCOUNTER — Other Ambulatory Visit: Payer: Self-pay

## 2021-02-26 ENCOUNTER — Encounter (HOSPITAL_COMMUNITY): Payer: Self-pay

## 2021-02-26 ENCOUNTER — Encounter (HOSPITAL_COMMUNITY): Payer: Self-pay | Admitting: Physician Assistant

## 2021-02-26 ENCOUNTER — Encounter (HOSPITAL_COMMUNITY)
Admission: RE | Admit: 2021-02-26 | Discharge: 2021-02-26 | Disposition: A | Discharge status: Home / Self Care | Payer: No Typology Code available for payment source | Source: Ambulatory Visit | Attending: Orthopedic Surgery | Admitting: Orthopedic Surgery

## 2021-02-26 ENCOUNTER — Ambulatory Visit (HOSPITAL_COMMUNITY)
Admission: RE | Admit: 2021-02-26 | Discharge: 2021-02-26 | Disposition: A | Discharge status: Home / Self Care | Payer: No Typology Code available for payment source | Source: Ambulatory Visit | Attending: Gastroenterology | Admitting: Gastroenterology

## 2021-02-26 ENCOUNTER — Other Ambulatory Visit (INDEPENDENT_AMBULATORY_CARE_PROVIDER_SITE_OTHER): Payer: No Typology Code available for payment source

## 2021-02-26 VITALS — BP 104/58 | HR 92 | Temp 98.1°F | Resp 19 | Ht 66.0 in | Wt 165.0 lb

## 2021-02-26 DIAGNOSIS — Z01812 Encounter for preprocedural laboratory examination: Secondary | ICD-10-CM | POA: Insufficient documentation

## 2021-02-26 DIAGNOSIS — G4733 Obstructive sleep apnea (adult) (pediatric): Secondary | ICD-10-CM | POA: Diagnosis not present

## 2021-02-26 DIAGNOSIS — J449 Chronic obstructive pulmonary disease, unspecified: Secondary | ICD-10-CM | POA: Insufficient documentation

## 2021-02-26 DIAGNOSIS — K7460 Unspecified cirrhosis of liver: Secondary | ICD-10-CM | POA: Insufficient documentation

## 2021-02-26 DIAGNOSIS — I8501 Esophageal varices with bleeding: Secondary | ICD-10-CM | POA: Diagnosis not present

## 2021-02-26 DIAGNOSIS — E119 Type 2 diabetes mellitus without complications: Secondary | ICD-10-CM | POA: Insufficient documentation

## 2021-02-26 DIAGNOSIS — Z8673 Personal history of transient ischemic attack (TIA), and cerebral infarction without residual deficits: Secondary | ICD-10-CM | POA: Diagnosis not present

## 2021-02-26 DIAGNOSIS — R1111 Vomiting without nausea: Secondary | ICD-10-CM

## 2021-02-26 DIAGNOSIS — Z9981 Dependence on supplemental oxygen: Secondary | ICD-10-CM | POA: Diagnosis not present

## 2021-02-26 DIAGNOSIS — Z01818 Encounter for other preprocedural examination: Secondary | ICD-10-CM

## 2021-02-26 HISTORY — DX: Cerebral infarction, unspecified: I63.9

## 2021-02-26 HISTORY — DX: Personal history of urinary calculi: Z87.442

## 2021-02-26 HISTORY — DX: Dyspnea, unspecified: R06.00

## 2021-02-26 LAB — COMPREHENSIVE METABOLIC PANEL
ALT: 23 U/L (ref 0–44)
AST: 29 U/L (ref 15–41)
Albumin: 3.1 g/dL — ABNORMAL LOW (ref 3.5–5.0)
Alkaline Phosphatase: 105 U/L (ref 38–126)
Anion gap: 7 (ref 5–15)
BUN: 22 mg/dL (ref 8–23)
CO2: 27 mmol/L (ref 22–32)
Calcium: 9.5 mg/dL (ref 8.9–10.3)
Chloride: 102 mmol/L (ref 98–111)
Creatinine, Ser: 1.89 mg/dL — ABNORMAL HIGH (ref 0.61–1.24)
GFR, Estimated: 37 mL/min — ABNORMAL LOW (ref >60)
Glucose, Bld: 226 mg/dL — ABNORMAL HIGH (ref 70–99)
Potassium: 4 mmol/L (ref 3.5–5.1)
Sodium: 136 mmol/L (ref 135–145)
Total Bilirubin: 0.9 mg/dL (ref 0.3–1.2)
Total Protein: 8.1 g/dL (ref 6.5–8.1)

## 2021-02-26 LAB — CBC
HCT: 30.9 % — ABNORMAL LOW (ref 39.0–52.0)
Hemoglobin: 9.7 g/dL — ABNORMAL LOW (ref 13.0–17.0)
MCH: 29.5 pg (ref 26.0–34.0)
MCHC: 31.4 g/dL (ref 30.0–36.0)
MCV: 93.9 fL (ref 80.0–100.0)
Platelets: 103 10*3/uL — ABNORMAL LOW (ref 150–400)
RBC: 3.29 MIL/uL — ABNORMAL LOW (ref 4.22–5.81)
RDW: 16.9 % — ABNORMAL HIGH (ref 11.5–15.5)
WBC: 3.7 10*3/uL — ABNORMAL LOW (ref 4.0–10.5)
nRBC: 0 % (ref 0.0–0.2)

## 2021-02-26 LAB — SURGICAL PCR SCREEN
MRSA, PCR: NEGATIVE
Staphylococcus aureus: NEGATIVE

## 2021-02-26 LAB — URINALYSIS, COMPLETE (UACMP) WITH MICROSCOPIC
Bilirubin Urine: NEGATIVE
Glucose, UA: NEGATIVE mg/dL
Hgb urine dipstick: NEGATIVE
Ketones, ur: NEGATIVE mg/dL
Leukocytes,Ua: NEGATIVE
Nitrite: NEGATIVE
Protein, ur: NEGATIVE mg/dL
Specific Gravity, Urine: >1.03 — ABNORMAL HIGH (ref 1.005–1.030)
pH: 5.5 (ref 5.0–8.0)

## 2021-02-26 LAB — HEMOGLOBIN A1C
Hgb A1c MFr Bld: 6.8 % — ABNORMAL HIGH (ref 4.8–5.6)
Mean Plasma Glucose: 148.46 mg/dL

## 2021-02-26 LAB — BASIC METABOLIC PANEL
BUN: 23 mg/dL (ref 6–23)
CO2: 31 mEq/L (ref 19–32)
Calcium: 9.7 mg/dL (ref 8.4–10.5)
Chloride: 101 mEq/L (ref 96–112)
Creatinine, Ser: 1.86 mg/dL — ABNORMAL HIGH (ref 0.40–1.50)
GFR: 35.68 mL/min — ABNORMAL LOW (ref >60.00)
Glucose, Bld: 115 mg/dL — ABNORMAL HIGH (ref 70–99)
Potassium: 4.4 mEq/L (ref 3.5–5.1)
Sodium: 135 mEq/L (ref 135–145)

## 2021-02-26 LAB — GLUCOSE, CAPILLARY: Glucose-Capillary: 206 mg/dL — ABNORMAL HIGH (ref 70–99)

## 2021-02-26 NOTE — Progress Notes (Signed)
PCP - M[facility]) Cardiologist - denies  PPM/ICD - denies    Chest x-ray - n/a EKG - 10/14/20 Stress Test - over 10 years ago, normal per patient. Doesn't remember where it was done or doctor ECHO - 05/01/20 Cardiac Cath - denies  Sleep Study - +OSA CPAP - does not wear   Fasting Blood Sugar - 160 Checks Blood Sugar once a day  As of today, STOP taking any Aspirin (unless otherwise instructed by your surgeon) Aleve, Naproxen, Ibuprofen, Motrin, Advil, Goody's, BC's, all herbal medications, fish oil, and all vitamins.  ERAS Protcol -yes PRE-SURGERY Ensure or G2- none ordered  COVID TEST- ambulatory surgery   Anesthesia review: yes, per order  Patient denies shortness of breath, fever, cough and chest pain at PAT appointment   All instructions explained to the patient, with a verbal understanding of the material. Patient agrees to go over the instructions while at home for a better understanding. Patient also instructed to self quarantine after being tested for COVID-19. The opportunity to ask questions was provided.

## 2021-02-26 NOTE — Progress Notes (Signed)
Notified dr Rolena Infante office of hemoglobin result of 9.7.

## 2021-02-26 NOTE — Progress Notes (Signed)
Anesthesia Chart Review:  Pt seen by PCP at [facility] 01/27/21 for preop eval. Per note, "preop evaluation for kyphoplasty from a compression fracture T6 -Preop assessment patient is able to perform > 4 MET such as walk to the lower field with 10 lb weight on the legs prior to this occured, no unstable angina. Has seen his cardiologist for cardiac clearance. EKG 10/21 showed normal sinus rhythm, LAFB, possible age indeterminant infarct in the anterior leads. Revised Cardiac Index = 2. Interpretation: This score belongs to Class III of risk for perioperative cardiac events with a risk percentage of 6.6%."  Cardiology clearance on chart dated 01/22/2021 states "may proceed without further cardiac testing."  Copy on chart  History of COPD, maintained on ipratropium and as needed albuterol.  Recently evaluated for home O2 need, pt states he did qualify and will be picking up equipment at the [place] on 12/2.  History of OSA, patient reports he does not use CPAP.  History of CVA April 2022.  History of decompensated cirrhosis with esophageal varices status post numerous banding procedures, most recently 02/11/21.  Patient does have clearance from gastroenterologist Dr. Havery Moros dated 02/24/2021.  Per clearance, "patient has decompensated cirrhosis and higher than average risk but is stable at this time.  Would use least amount of sedation possible for this procedure.  Would also recommend getting clearance from patient's cardiologist at the [facility] whom he follows with."  Copy on chart.  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  Last seen 01/01/2021 and it was noted that his CBC had improved he was stable at that time from hematologic standpoint.  Preop labs reviewed, notable for elevated creatinine 1.89 (near baseline), anemia with hemoglobin 9.7 (near baseline), and platelets 103K (near baseline).  IDDM2 well-controlled, A1c 6.8.  EKG 10/14/2020: Sinus  rhythm.  Rate 89.  Low voltage with right axis deviation.  Consider anterior infarct.  No significant change since last tracing.  TTE 05/01/2020:  1. Left ventricular ejection fraction, by estimation, is 60 to 65%. The  left ventricle has normal function. The left ventricle has no regional  wall motion abnormalities. Left ventricular diastolic parameters were  normal.   2. Right ventricular systolic function is low normal. The right  ventricular size is mildly enlarged. There is normal pulmonary artery  systolic pressure.   3. The mitral valve is normal in structure. Trivial mitral valve  regurgitation.   4. The aortic valve is abnormal. Aortic valve regurgitation is mild. Mild  to moderate aortic valve sclerosis/calcification is present, without any  evidence of aortic stenosis.   5. Aortic dilatation noted. There is mild dilatation of the ascending  aorta, measuring 39 mm.   6. The inferior vena cava is normal in size with greater than 50%  respiratory variability, suggesting right atrial pressure of 3 mmHg.    [name] [facility] Short Stay Center/Anesthesiology Phone [phone] 02/27/2021 2:08 PM

## 2021-02-26 NOTE — Telephone Encounter (Signed)
Spoke with patient's wife to remind her that pt is due for repeat labs at this time. Pt's wife states that they will be in town today for a radiology appt. Pt's wife will bring pt by after that appt for labs. Patient's wife verbalized understanding and had no concerns at the end of the call.

## 2021-02-26 NOTE — Telephone Encounter (Signed)
-----   Message from Yevette Edwards, RN sent at 02/18/2021 10:01 AM EST ----- Regarding: Labs BMET, the order is in epic

## 2021-02-26 NOTE — Progress Notes (Signed)
Notified Dr.  Rolena Infante office about needing the clearance letters faxed to [place] before the DOS per [facility] request. Left voicemail with this information.

## 2021-02-27 ENCOUNTER — Other Ambulatory Visit: Payer: Self-pay

## 2021-02-27 DIAGNOSIS — K7460 Unspecified cirrhosis of liver: Secondary | ICD-10-CM

## 2021-02-27 NOTE — Anesthesia Preprocedure Evaluation (Deleted)
Anesthesia Evaluation    Airway        Dental   Pulmonary           Cardiovascular hypertension,      Neuro/Psych    GI/Hepatic   Endo/Other  diabetes  Renal/GU      Musculoskeletal   Abdominal   Peds  Hematology   Anesthesia Other Findings   Reproductive/Obstetrics                             Anesthesia Physical Anesthesia Plan  ASA:   Anesthesia Plan:    Post-op Pain Management:    Induction:   PONV Risk Score and Plan:   Airway Management Planned:   Additional Equipment:   Intra-op Plan:   Post-operative Plan:   Informed Consent:   Plan Discussed with:   Anesthesia Plan Comments: (PAT note by Karoline Caldwell, PA-C: Pt seen by PCP at [facility] 01/27/21 for preop eval. Per note, "preop evaluation for kyphoplasty from a compression fracture T6 -Preop assessment patient is able to perform > 4 MET such as walk to the lower field with 10 lb weight on the legs prior to this occured, no unstable angina. Has seen his cardiologist for cardiac clearance. EKG 10/21 showed normal sinus rhythm, LAFB, possible age indeterminant infarct in the anterior leads. Revised Cardiac Index = 2. Interpretation: This score belongs to Class III of risk for perioperative cardiac events with a risk percentage of 6.6%."  Cardiology clearance on chart dated 01/22/2021 states "may proceed without further cardiac testing."  Copy on chart  History of COPD, maintained on ipratropium and as needed albuterol.  Recently evaluated for home O2 need, pt states he did qualify and will be picking up equipment at the [place] on 12/2.  History of OSA, patient reports he does not use CPAP.  History of CVA April 2022.  History of decompensated cirrhosis with esophageal varices status post numerous banding procedures, most recently 02/11/21.  Patient does have clearance from gastroenterologist Dr. Havery Moros dated 02/24/2021.  Per  clearance, "patient has decompensated cirrhosis and higher than average risk but is stable at this time.  Would use least amount of sedation possible for this procedure.  Would also recommend getting clearance from patient's cardiologist at the [facility] whom he follows with."  Copy on chart.  Follows with hematology at the [facility] for history of myelodysplastic syndrome secondary to hypersplenism.  Last seen 01/01/2021 and it was noted that his CBC had improved he was stable at that time from hematologic standpoint.  Preop labs reviewed, notable for elevated creatinine 1.89 (near baseline), anemia with hemoglobin 9.7 (near baseline), and platelets 103K (near baseline).  IDDM2 well-controlled, A1c 6.8.  EKG 10/14/2020: Sinus rhythm.  Rate 89.  Low voltage with right axis deviation.  Consider anterior infarct.  No significant change since last tracing.  TTE 05/01/2020: 1. Left ventricular ejection fraction, by estimation, is 60 to 65%. The  left ventricle has normal function. The left ventricle has no regional  wall motion abnormalities. Left ventricular diastolic parameters were  normal.  2. Right ventricular systolic function is low normal. The right  ventricular size is mildly enlarged. There is normal pulmonary artery  systolic pressure.  3. The mitral valve is normal in structure. Trivial mitral valve  regurgitation.  4. The aortic valve is abnormal. Aortic valve regurgitation is mild. Mild  to moderate aortic valve sclerosis/calcification is present, without any  evidence of aortic stenosis.  5. Aortic dilatation noted. There is mild dilatation of the ascending  aorta, measuring 39 mm.  6. The inferior vena cava is normal in size with greater than 50%  respiratory variability, suggesting right atrial pressure of 3 mmHg.  )        Anesthesia Quick Evaluation

## 2021-03-04 ENCOUNTER — Ambulatory Visit: Admit: 2021-03-04 | Payer: Non-veteran care | Admitting: Orthopedic Surgery

## 2021-03-04 SURGERY — KYPHOPLASTY
Anesthesia: General

## 2021-03-17 ENCOUNTER — Encounter (HOSPITAL_COMMUNITY): Payer: Self-pay | Admitting: Gastroenterology

## 2021-03-30 NOTE — Anesthesia Preprocedure Evaluation (Addendum)
Anesthesia Evaluation  Patient identified by MRN, date of birth, ID band Patient awake    Reviewed: Allergy & Precautions, NPO status , Patient's Chart, lab work & pertinent test results  History of Anesthesia Complications Negative for: history of anesthetic complications  Airway Mallampati: II  TM Distance: >3 FB Neck ROM: Full    Dental   Pulmonary asthma , sleep apnea , COPD,    Pulmonary exam normal        Cardiovascular hypertension, Normal cardiovascular exam     Neuro/Psych Anxiety Depression Parkinsonism CVA    GI/Hepatic GERD  ,(+) Cirrhosis   Esophageal Varices    ,   Endo/Other  diabetes, Type 2, Insulin DependentHypothyroidism   Renal/GU CRFRenal disease  negative genitourinary   Musculoskeletal negative musculoskeletal ROS (+)   Abdominal   Peds  Hematology  (+) anemia , Myelodysplastic syndrome w/ pancytopenia   Anesthesia Other Findings   Reproductive/Obstetrics                            Anesthesia Physical Anesthesia Plan  ASA: 3  Anesthesia Plan: MAC   Post-op Pain Management: Minimal or no pain anticipated   Induction: Intravenous  PONV Risk Score and Plan: 1 and Propofol infusion, TIVA and Treatment may vary due to age or medical condition  Airway Management Planned: Natural Airway, Nasal Cannula and Simple Face Mask  Additional Equipment: None  Intra-op Plan:   Post-operative Plan:   Informed Consent: I have reviewed the patients History and Physical, chart, labs and discussed the procedure including the risks, benefits and alternatives for the proposed anesthesia with the patient or authorized representative who has indicated his/her understanding and acceptance.       Plan Discussed with:   Anesthesia Plan Comments:        Anesthesia Quick Evaluation

## 2021-03-31 ENCOUNTER — Ambulatory Visit (HOSPITAL_COMMUNITY): Payer: No Typology Code available for payment source | Admitting: Anesthesiology

## 2021-03-31 ENCOUNTER — Ambulatory Visit (HOSPITAL_COMMUNITY)
Admission: RE | Admit: 2021-03-31 | Discharge: 2021-03-31 | Disposition: A | Discharge status: Home / Self Care | Payer: No Typology Code available for payment source | Attending: Gastroenterology | Admitting: Gastroenterology

## 2021-03-31 ENCOUNTER — Other Ambulatory Visit: Payer: Self-pay

## 2021-03-31 ENCOUNTER — Encounter (HOSPITAL_COMMUNITY)
Admission: RE | Disposition: A | Discharge status: Home / Self Care | Payer: Self-pay | Source: Home / Self Care | Attending: Gastroenterology

## 2021-03-31 ENCOUNTER — Encounter (HOSPITAL_COMMUNITY): Payer: Self-pay | Admitting: Gastroenterology

## 2021-03-31 DIAGNOSIS — I8501 Esophageal varices with bleeding: Secondary | ICD-10-CM

## 2021-03-31 DIAGNOSIS — M795 Residual foreign body in soft tissue: Secondary | ICD-10-CM | POA: Insufficient documentation

## 2021-03-31 DIAGNOSIS — I8510 Secondary esophageal varices without bleeding: Secondary | ICD-10-CM | POA: Insufficient documentation

## 2021-03-31 DIAGNOSIS — K766 Portal hypertension: Secondary | ICD-10-CM | POA: Diagnosis not present

## 2021-03-31 DIAGNOSIS — K3189 Other diseases of stomach and duodenum: Secondary | ICD-10-CM | POA: Insufficient documentation

## 2021-03-31 DIAGNOSIS — K7460 Unspecified cirrhosis of liver: Secondary | ICD-10-CM

## 2021-03-31 HISTORY — PX: ESOPHAGOGASTRODUODENOSCOPY (EGD) WITH PROPOFOL: SHX5813

## 2021-03-31 LAB — GLUCOSE, CAPILLARY
Glucose-Capillary: 149 mg/dL — ABNORMAL HIGH (ref 70–99)
Glucose-Capillary: 149 mg/dL — ABNORMAL HIGH (ref 70–99)

## 2021-03-31 SURGERY — ESOPHAGOGASTRODUODENOSCOPY (EGD) WITH PROPOFOL
Anesthesia: Monitor Anesthesia Care

## 2021-03-31 MED ORDER — SODIUM CHLORIDE 0.9 % IV SOLN: INTRAVENOUS | Status: DC

## 2021-03-31 MED ORDER — LACTATED RINGERS IV SOLN
INTRAVENOUS | Status: AC | PRN
  Administered 2021-03-31: 1000 mL via INTRAVENOUS

## 2021-03-31 MED ORDER — LIDOCAINE 2% (20 MG/ML) 5 ML SYRINGE
INTRAMUSCULAR | Status: DC | PRN
  Administered 2021-03-31: 100 mg via INTRAVENOUS

## 2021-03-31 MED ORDER — PROPOFOL 500 MG/50ML IV EMUL
INTRAVENOUS | Status: AC
  Filled 2021-03-31: qty 50

## 2021-03-31 MED ORDER — PROPOFOL 10 MG/ML IV BOLUS
INTRAVENOUS | Status: DC | PRN
Start: 2021-03-31 — End: 2021-03-31
  Administered 2021-03-31: 10 mg via INTRAVENOUS
  Administered 2021-03-31: 20 mg via INTRAVENOUS
  Administered 2021-03-31: 10 mg via INTRAVENOUS

## 2021-03-31 MED ORDER — PROPOFOL 500 MG/50ML IV EMUL
INTRAVENOUS | Status: DC | PRN
  Administered 2021-03-31: 100 ug/kg/min via INTRAVENOUS

## 2021-03-31 SURGICAL SUPPLY — 15 items

## 2021-03-31 NOTE — Transfer of Care (Signed)
Immediate Anesthesia Transfer of Care Note  Patient: Corey Wood  Procedure(s) Performed: ESOPHAGOGASTRODUODENOSCOPY (EGD) WITH PROPOFOL  Patient Location: PACU  Anesthesia Type:MAC  Level of Consciousness: sedated  Airway & Oxygen Therapy: Patient Spontanous Breathing and Patient connected to face mask oxygen  Post-op Assessment: Report given to RN and Post -op Vital signs reviewed and stable  Post vital signs: Reviewed and stable  Last Vitals:  Vitals Value Taken Time  BP 94/57 03/31/21 0744  Temp    Pulse 65 03/31/21 0745  Resp 33 03/31/21 0745  SpO2 100 % 03/31/21 0745  Vitals shown include unvalidated device data.  Last Pain:  Vitals:   03/31/21 0647  TempSrc: Oral  PainSc: 0-No pain         Complications: No notable events documented.

## 2021-03-31 NOTE — H&P (Signed)
[facility] History and Physical   Primary Care Physician:  [facility]   Reason for Procedure:   Esophageal varices  Plan:    EGD with possible band ligation     HPI: Corey Wood is a 71 y.o. male  here for EGD to survey esophageal varices. History of large esophageal varices - multiple EGDs in the past with banding, total 32 bands placed to date. Last exam done in November where 3 bands placed, improvement of varices noted and smaller size. Intolerant of beta blockade. He states he is feeling well today without cardiopulmonary complaints.   I have discussed risks / benefits of endoscopy, anesthesia, with him, he understands the risks and wishes to proceed. Hopefully we are getting to a point where he does not warrant further banding, but will place bands if needed based on endoscopic appearance. He wishes to proceed.   Past Medical History:  Diagnosis Date   Allergy    seasonal allergies   Anxiety    on meds   Aortic valve disorder    Arthritis    generalized (fingers)(shoulders)   Asthma    uses inhaler   Cataract    bilateral -sx    Cirrhosis (HCC)    CKD (chronic kidney disease), stage III (HCC)    only has one kidney   COPD (chronic obstructive pulmonary disease) (HCC)    Depression    on meds   DM (diabetes mellitus) ([place])    on meds   Dyspnea    GERD (gastroesophageal reflux disease)    on meds   Heart murmur    History of colon polyps    History of COVID-19 10/14/2020   History of kidney stones    Hx of acute pancreatitis 10/2019   Hyperlipidemia    on meds   Hypertension    on meds   Hypothyroidism    not on meds at this time   Myelodysplastic syndrome ([place])    Neuromuscular disorder ([place])    per pt   Obstructive sleep apnea    Oxygen deficiency    Parkinsonism ([place])    per pt report   Peripheral edema    bilateral legs   Peripheral positional vertigo    Sleep apnea    No CPAP   Stroke [facility])     Past Surgical  History:  Procedure Laterality Date   ANKLE SURGERY     CHOLECYSTECTOMY     ENDARTERECTOMY Left 04/29/2020   Procedure: CAROTID EXPLORATION removal of  central venous catheter;  Surgeon: Rosetta Posner, MD;  Location: [facility] OR;  Service: Vascular;  Laterality: Left;   ESOPHAGEAL BANDING N/A 04/17/2020   Procedure: ESOPHAGEAL BANDING;  Surgeon: Yetta Flock, MD;  Location: [facility];  Service: Gastroenterology;  Laterality: N/A;   ESOPHAGEAL BANDING  04/29/2020   Procedure: ESOPHAGEAL BANDING;  Surgeon: Rush Landmark Telford Nab., MD;  Location: [place];  Service: Gastroenterology;;   ESOPHAGEAL BANDING N/A 06/10/2020   Procedure: ESOPHAGEAL BANDING;  Surgeon: Yetta Flock, MD;  Location: [facility];  Service: Gastroenterology;  Laterality: N/A;   ESOPHAGEAL BANDING N/A 11/10/2020   Procedure: ESOPHAGEAL BANDING;  Surgeon: Yetta Flock, MD;  Location: [facility];  Service: Gastroenterology;  Laterality: N/A;   ESOPHAGEAL BANDING N/A 02/10/2021   Procedure: ESOPHAGEAL BANDING;  Surgeon: Jerene Bears, MD;  Location: [facility];  Service: Gastroenterology;  Laterality: N/A;   ESOPHAGOGASTRODUODENOSCOPY N/A 04/25/2020   Procedure: ESOPHAGOGASTRODUODENOSCOPY (EGD);  Surgeon: Carol Ada, MD;  Location:  [facility];  Service: Endoscopy;  Laterality: N/A;   ESOPHAGOGASTRODUODENOSCOPY (EGD) WITH PROPOFOL N/A 04/17/2020   Procedure: ESOPHAGOGASTRODUODENOSCOPY (EGD) WITH PROPOFOL;  Surgeon: Yetta Flock, MD;  Location: [facility];  Service: Gastroenterology;  Laterality: N/A;   ESOPHAGOGASTRODUODENOSCOPY (EGD) WITH PROPOFOL N/A 04/29/2020   Procedure: ESOPHAGOGASTRODUODENOSCOPY (EGD) WITH PROPOFOL;  Surgeon: Rush Landmark Telford Nab., MD;  Location: [place];  Service: Gastroenterology;  Laterality: N/A;   ESOPHAGOGASTRODUODENOSCOPY (EGD) WITH PROPOFOL N/A 06/10/2020   Procedure: ESOPHAGOGASTRODUODENOSCOPY (EGD) WITH PROPOFOL;  Surgeon: Yetta Flock, MD;   [facility]: [facility];  Service: Gastroenterology;  Laterality: N/A;   ESOPHAGOGASTRODUODENOSCOPY (EGD) WITH PROPOFOL N/A 11/10/2020   Procedure: ESOPHAGOGASTRODUODENOSCOPY (EGD) WITH PROPOFOL;  Surgeon: Yetta Flock, MD;  Location: [facility];  Service: Gastroenterology;  Laterality: N/A;   ESOPHAGOGASTRODUODENOSCOPY (EGD) WITH PROPOFOL N/A 02/10/2021   Procedure: ESOPHAGOGASTRODUODENOSCOPY (EGD) WITH PROPOFOL;  Surgeon: Jerene Bears, MD;  Location: [facility];  Service: Gastroenterology;  Laterality: N/A;   EYE SURGERY     HERNIA REPAIR     IR PARACENTESIS  09/18/2020   KIDNEY STONE SURGERY     WISDOM TOOTH EXTRACTION      Prior to Admission medications   Medication Sig Start Date End Date Taking? Authorizing Provider  acetaminophen (TYLENOL) 325 MG tablet Take 650 mg by mouth every 6 (six) hours as needed for moderate pain.   Yes [provider]  albuterol (VENTOLIN HFA) 108 (90 Base) MCG/ACT inhaler Inhale 1-2 puffs into the lungs every 6 (six) hours as needed for wheezing or shortness of breath.   Yes [provider]  ARTIFICIAL SALIVA MT Use as directed 1 spray in the mouth or throat daily as needed (Dry mouth). 10/26/20  Yes [provider]  aspirin 325 MG EC tablet Take 325 mg by mouth daily.   Yes [provider]  atorvastatin (LIPITOR) 40 MG tablet Take 40 mg by mouth at bedtime.   Yes [provider]  buPROPion (WELLBUTRIN XL) 150 MG 24 hr tablet Take 150 mg by mouth daily.   Yes [provider]  citalopram (CELEXA) 40 MG tablet Take 40 mg by mouth daily.   Yes [provider]  ferrous sulfate 325 (65 FE) MG tablet Take 325 mg by mouth every Monday, Wednesday, and Friday.   Yes [provider]  fluticasone (FLONASE) 50 MCG/ACT nasal spray Place 1 spray into both nostrils daily as needed for allergies or rhinitis.   Yes [provider]  furosemide (LASIX) 20 MG tablet Take 20 mg by mouth every  other day.   Yes [provider]  insulin glargine (LANTUS) 100 unit/mL SOPN Inject 20 Units into the skin daily.   Yes [provider]  ipratropium (ATROVENT) 0.03 % nasal spray Place 1 spray into both nostrils at bedtime.   Yes [provider]  lactulose (CHRONULAC) 10 GM/15ML solution Take 15 mLs (10 g total) by mouth daily. 02/17/21  Yes Rolin Schult, Carlota Raspberry, MD  latanoprost (XALATAN) 0.005 % ophthalmic solution Place 1 drop into both eyes at bedtime. 08/20/20  Yes [provider]  lidocaine (LIDODERM) 5 % Place 1 patch onto the skin daily as needed (pain). Remove & Discard patch within 12 hours or as directed by MD   Yes [provider]  loratadine (CLARITIN) 10 MG tablet Take 10 mg by mouth daily. 09/04/20  Yes [provider]  meclizine (ANTIVERT) 25 MG tablet Take 25 mg by mouth 2 (two) times daily as needed for dizziness.  Yes [provider]  montelukast (SINGULAIR) 10 MG tablet Take 10 mg by mouth daily. 05/22/20  Yes [provider]  Multiple Vitamin (MULTIVITAMIN WITH MINERALS) TABS tablet Take 1 tablet by mouth daily.   Yes [provider]  ondansetron (ZOFRAN-ODT) 4 MG disintegrating tablet Take 4 mg by mouth every 8 (eight) hours as needed for nausea or vomiting. 10/13/20  Yes [provider]  pantoprazole (PROTONIX) 40 MG tablet Take 40 mg by mouth daily with breakfast.   Yes [provider]  phenylephrine-shark liver oil-mineral oil-petrolatum (PREPARATION H) 0.25-14-74.9 % rectal ointment Place 1 application rectally 2 (two) times daily as needed for hemorrhoids.   Yes [provider]  spironolactone (ALDACTONE) 100 MG tablet Take 100 mg by mouth every other day.   Yes [provider]  tamsulosin (FLOMAX) 0.4 MG CAPS capsule Take 0.4 mg by mouth 2 (two) times daily.   Yes [provider]  traZODone (DESYREL) 150 MG tablet Take 75 mg by mouth at bedtime as needed  for sleep. 09/28/19  Yes [provider]  calcitonin, salmon, (MIACALCIN/FORTICAL) 200 UNIT/ACT nasal spray Place 1 spray into alternate nostrils daily.    [provider]    Current Facility-Administered Medications  Medication Dose Route Frequency Provider Last Rate Last Admin   0.9 %  sodium chloride infusion   Intravenous Continuous Priscilla Finklea, Carlota Raspberry, MD       lactated ringers infusion    Continuous PRN Nickey Kloepfer, Carlota Raspberry, MD 10 mL/hr at 03/31/21 0653 1,000 mL at 03/31/21 0653    Allergies as of 02/11/2021 - Review Complete 02/10/2021  Allergen Reaction Noted   Lisinopril Cough 07/12/2012    Family History  Problem Relation Age of Onset   Liver cancer Mother    Colon cancer Neg Hx    Stomach cancer Neg Hx    Colon polyps Neg Hx    Esophageal cancer Neg Hx    Rectal cancer Neg Hx     Social History   Socioeconomic History   Marital status: Married    Spouse name: Not on file   Number of children: Not on file   Years of education: Not on file   Highest education level: Not on file  Occupational History   Occupation: retired  Tobacco Use   Smoking status: Never    Passive exposure: Never   Smokeless tobacco: Never  Vaping Use   Vaping Use: Never used  Substance and Sexual Activity   Alcohol use: Not Currently   Drug use: Not Currently   Sexual activity: Not Currently  Other Topics Concern   Not on file  Social History Narrative   Not on file   Social Determinants of Health   Financial Resource Strain: Not on file  Food Insecurity: Not on file  Transportation Needs: Not on file  Physical Activity: Not on file  Stress: Not on file  Social Connections: Not on file  Intimate Partner Violence: Not on file    Review of Systems: All other review of systems negative except as mentioned in the HPI.  Lab Results  Component Value Date   WBC 3.7 (L) 02/26/2021   HGB 9.7 (L) 02/26/2021   HCT 30.9 (L) 02/26/2021   MCV 93.9 02/26/2021   PLT  103 (L) 02/26/2021    Lab Results  Component Value Date   CREATININE 1.89 (H) 02/26/2021   BUN 22 02/26/2021   NA 136 02/26/2021   K 4.0 02/26/2021   CL 102 02/26/2021  CO2 27 02/26/2021    Lab Results  Component Value Date   ALT 23 02/26/2021   AST 29 02/26/2021   ALKPHOS 105 02/26/2021   BILITOT 0.9 02/26/2021    Lab Results  Component Value Date   INR 1.4 (H) 02/17/2021   INR 1.1 10/14/2020   INR 1.2 05/04/2020      Physical Exam: Vital signs BP 132/73    Pulse 88    Temp 98.4 F (36.9 C) (Oral)    Resp 15    Ht 5\' 6"  (1.676 m)    Wt 73.9 kg    SpO2 94%    BMI 26.31 kg/m   General:   Alert,  Well-developed, pleasant and cooperative in NAD Lungs:  Clear throughout to auscultation.   Heart:  Regular rate and rhythm Abdomen:  Soft, nontender and nondistended.   Neuro/Psych:  Alert and cooperative. Normal mood and affect. A and O x 3  Jolly Mango, MD [facility]

## 2021-03-31 NOTE — Anesthesia Postprocedure Evaluation (Signed)
Anesthesia Post Note  Patient: Corey Wood  Procedure(s) Performed: ESOPHAGOGASTRODUODENOSCOPY (EGD) WITH PROPOFOL     Patient location during evaluation: Endoscopy Anesthesia Type: MAC Level of consciousness: awake and alert Pain management: pain level controlled Vital Signs Assessment: post-procedure vital signs reviewed and stable Respiratory status: spontaneous breathing, nonlabored ventilation and respiratory function stable Cardiovascular status: blood pressure returned to baseline and stable Postop Assessment: no apparent nausea or vomiting Anesthetic complications: no   No notable events documented.  Last Vitals:  Vitals:   03/31/21 0810 03/31/21 0820  BP: 112/69 118/85  Pulse: 63 63  Resp: (!) 25 (!) 22  Temp:    SpO2: 93% 93%    Last Pain:  Vitals:   03/31/21 0820  TempSrc:   PainSc: 0-No pain                 Lidia Collum

## 2021-03-31 NOTE — H&P (View-Only) (Signed)
[facility] History and Physical   Primary Care Physician:  [facility]   Reason for Procedure:   Esophageal varices  Plan:    EGD with possible band ligation     HPI: Corey Wood is a 71 y.o. male  here for EGD to survey esophageal varices. History of large esophageal varices - multiple EGDs in the past with banding, total 32 bands placed to date. Last exam done in November where 3 bands placed, improvement of varices noted and smaller size. Intolerant of beta blockade. He states he is feeling well today without cardiopulmonary complaints.   I have discussed risks / benefits of endoscopy, anesthesia, with him, he understands the risks and wishes to proceed. Hopefully we are getting to a point where he does not warrant further banding, but will place bands if needed based on endoscopic appearance. He wishes to proceed.   Past Medical History:  Diagnosis Date   Allergy    seasonal allergies   Anxiety    on meds   Aortic valve disorder    Arthritis    generalized (fingers)(shoulders)   Asthma    uses inhaler   Cataract    bilateral -sx    Cirrhosis (HCC)    CKD (chronic kidney disease), stage III (HCC)    only has one kidney   COPD (chronic obstructive pulmonary disease) (HCC)    Depression    on meds   DM (diabetes mellitus) ([place])    on meds   Dyspnea    GERD (gastroesophageal reflux disease)    on meds   Heart murmur    History of colon polyps    History of COVID-19 10/14/2020   History of kidney stones    Hx of acute pancreatitis 10/2019   Hyperlipidemia    on meds   Hypertension    on meds   Hypothyroidism    not on meds at this time   Myelodysplastic syndrome ([name])    Neuromuscular disorder ([place])    per pt   Obstructive sleep apnea    Oxygen deficiency    Parkinsonism ([place])    per pt report   Peripheral edema    bilateral legs   Peripheral positional vertigo    Sleep apnea    No CPAP   Stroke [facility])     Past Surgical  History:  Procedure Laterality Date   ANKLE SURGERY     CHOLECYSTECTOMY     ENDARTERECTOMY Left 04/29/2020   Procedure: CAROTID EXPLORATION removal of  central venous catheter;  Surgeon: Rosetta Posner, MD;  Location: [facility] OR;  Service: Vascular;  Laterality: Left;   ESOPHAGEAL BANDING N/A 04/17/2020   Procedure: ESOPHAGEAL BANDING;  Surgeon: Yetta Flock, MD;  Location: [facility];  Service: Gastroenterology;  Laterality: N/A;   ESOPHAGEAL BANDING  04/29/2020   Procedure: ESOPHAGEAL BANDING;  Surgeon: Rush Landmark Telford Nab., MD;  Location: [place];  Service: Gastroenterology;;   ESOPHAGEAL BANDING N/A 06/10/2020   Procedure: ESOPHAGEAL BANDING;  Surgeon: Yetta Flock, MD;  Location: [facility];  Service: Gastroenterology;  Laterality: N/A;   ESOPHAGEAL BANDING N/A 11/10/2020   Procedure: ESOPHAGEAL BANDING;  Surgeon: Yetta Flock, MD;  Location: [facility];  Service: Gastroenterology;  Laterality: N/A;   ESOPHAGEAL BANDING N/A 02/10/2021   Procedure: ESOPHAGEAL BANDING;  Surgeon: Jerene Bears, MD;  Location: [facility];  Service: Gastroenterology;  Laterality: N/A;   ESOPHAGOGASTRODUODENOSCOPY N/A 04/25/2020   Procedure: ESOPHAGOGASTRODUODENOSCOPY (EGD);  Surgeon: Carol Ada, MD;  Location:  [facility];  Service: Endoscopy;  Laterality: N/A;   ESOPHAGOGASTRODUODENOSCOPY (EGD) WITH PROPOFOL N/A 04/17/2020   Procedure: ESOPHAGOGASTRODUODENOSCOPY (EGD) WITH PROPOFOL;  Surgeon: Yetta Flock, MD;  Location: [facility];  Service: Gastroenterology;  Laterality: N/A;   ESOPHAGOGASTRODUODENOSCOPY (EGD) WITH PROPOFOL N/A 04/29/2020   Procedure: ESOPHAGOGASTRODUODENOSCOPY (EGD) WITH PROPOFOL;  Surgeon: Rush Landmark Telford Nab., MD;  Location: [place];  Service: Gastroenterology;  Laterality: N/A;   ESOPHAGOGASTRODUODENOSCOPY (EGD) WITH PROPOFOL N/A 06/10/2020   Procedure: ESOPHAGOGASTRODUODENOSCOPY (EGD) WITH PROPOFOL;  Surgeon: Yetta Flock, MD;   Location: [facility];  Service: Gastroenterology;  Laterality: N/A;   ESOPHAGOGASTRODUODENOSCOPY (EGD) WITH PROPOFOL N/A 11/10/2020   Procedure: ESOPHAGOGASTRODUODENOSCOPY (EGD) WITH PROPOFOL;  Surgeon: Yetta Flock, MD;  Location: [facility];  Service: Gastroenterology;  Laterality: N/A;   ESOPHAGOGASTRODUODENOSCOPY (EGD) WITH PROPOFOL N/A 02/10/2021   Procedure: ESOPHAGOGASTRODUODENOSCOPY (EGD) WITH PROPOFOL;  Surgeon: Jerene Bears, MD;  Location: [facility];  Service: Gastroenterology;  Laterality: N/A;   EYE SURGERY     HERNIA REPAIR     IR PARACENTESIS  09/18/2020   KIDNEY STONE SURGERY     WISDOM TOOTH EXTRACTION      Prior to Admission medications   Medication Sig Start Date End Date Taking? Authorizing Provider  acetaminophen (TYLENOL) 325 MG tablet Take 650 mg by mouth every 6 (six) hours as needed for moderate pain.   Yes [provider]  albuterol (VENTOLIN HFA) 108 (90 Base) MCG/ACT inhaler Inhale 1-2 puffs into the lungs every 6 (six) hours as needed for wheezing or shortness of breath.   Yes [provider]  ARTIFICIAL SALIVA MT Use as directed 1 spray in the mouth or throat daily as needed (Dry mouth). 10/26/20  Yes [provider]  aspirin 325 MG EC tablet Take 325 mg by mouth daily.   Yes [provider]  atorvastatin (LIPITOR) 40 MG tablet Take 40 mg by mouth at bedtime.   Yes [provider]  buPROPion (WELLBUTRIN XL) 150 MG 24 hr tablet Take 150 mg by mouth daily.   Yes [provider]  citalopram (CELEXA) 40 MG tablet Take 40 mg by mouth daily.   Yes [provider]  ferrous sulfate 325 (65 FE) MG tablet Take 325 mg by mouth every Monday, Wednesday, and Friday.   Yes [provider]  fluticasone (FLONASE) 50 MCG/ACT nasal spray Place 1 spray into both nostrils daily as needed for allergies or rhinitis.   Yes [provider]  furosemide (LASIX) 20 MG tablet Take 20 mg by mouth every  other day.   Yes [provider]  insulin glargine (LANTUS) 100 unit/mL SOPN Inject 20 Units into the skin daily.   Yes [provider]  ipratropium (ATROVENT) 0.03 % nasal spray Place 1 spray into both nostrils at bedtime.   Yes [provider]  lactulose (CHRONULAC) 10 GM/15ML solution Take 15 mLs (10 g total) by mouth daily. 02/17/21  Yes Pheobe Sandiford, Carlota Raspberry, MD  latanoprost (XALATAN) 0.005 % ophthalmic solution Place 1 drop into both eyes at bedtime. 08/20/20  Yes [provider]  lidocaine (LIDODERM) 5 % Place 1 patch onto the skin daily as needed (pain). Remove & Discard patch within 12 hours or as directed by MD   Yes [provider]  loratadine (CLARITIN) 10 MG tablet Take 10 mg by mouth daily. 09/04/20  Yes [provider]  meclizine (ANTIVERT) 25 MG tablet Take 25 mg by mouth 2 (two) times daily as needed for dizziness.  Yes [provider]  montelukast (SINGULAIR) 10 MG tablet Take 10 mg by mouth daily. 05/22/20  Yes [provider]  Multiple Vitamin (MULTIVITAMIN WITH MINERALS) TABS tablet Take 1 tablet by mouth daily.   Yes [provider]  ondansetron (ZOFRAN-ODT) 4 MG disintegrating tablet Take 4 mg by mouth every 8 (eight) hours as needed for nausea or vomiting. 10/13/20  Yes [provider]  pantoprazole (PROTONIX) 40 MG tablet Take 40 mg by mouth daily with breakfast.   Yes [provider]  phenylephrine-shark liver oil-mineral oil-petrolatum (PREPARATION H) 0.25-14-74.9 % rectal ointment Place 1 application rectally 2 (two) times daily as needed for hemorrhoids.   Yes [provider]  spironolactone (ALDACTONE) 100 MG tablet Take 100 mg by mouth every other day.   Yes [provider]  tamsulosin (FLOMAX) 0.4 MG CAPS capsule Take 0.4 mg by mouth 2 (two) times daily.   Yes [provider]  traZODone (DESYREL) 150 MG tablet Take 75 mg by mouth at bedtime as needed  for sleep. 09/28/19  Yes [provider]  calcitonin, salmon, (MIACALCIN/FORTICAL) 200 UNIT/ACT nasal spray Place 1 spray into alternate nostrils daily.    [provider]    Current Facility-Administered Medications  Medication Dose Route Frequency Provider Last Rate Last Admin   0.9 %  sodium chloride infusion   Intravenous Continuous Zamani Crocker, Carlota Raspberry, MD       lactated ringers infusion    Continuous PRN Vishwa Dais, Carlota Raspberry, MD 10 mL/hr at 03/31/21 0653 1,000 mL at 03/31/21 0653    Allergies as of 02/11/2021 - Review Complete 02/10/2021  Allergen Reaction Noted   Lisinopril Cough 07/12/2012    Family History  Problem Relation Age of Onset   Liver cancer Mother    Colon cancer Neg Hx    Stomach cancer Neg Hx    Colon polyps Neg Hx    Esophageal cancer Neg Hx    Rectal cancer Neg Hx     Social History   Socioeconomic History   Marital status: Married    Spouse name: Not on file   Number of children: Not on file   Years of education: Not on file   Highest education level: Not on file  Occupational History   Occupation: retired  Tobacco Use   Smoking status: Never    Passive exposure: Never   Smokeless tobacco: Never  Vaping Use   Vaping Use: Never used  Substance and Sexual Activity   Alcohol use: Not Currently   Drug use: Not Currently   Sexual activity: Not Currently  Other Topics Concern   Not on file  Social History Narrative   Not on file   Social Determinants of Health   Financial Resource Strain: Not on file  Food Insecurity: Not on file  Transportation Needs: Not on file  Physical Activity: Not on file  Stress: Not on file  Social Connections: Not on file  Intimate Partner Violence: Not on file    Review of Systems: All other review of systems negative except as mentioned in the HPI.  Lab Results  Component Value Date   WBC 3.7 (L) 02/26/2021   HGB 9.7 (L) 02/26/2021   HCT 30.9 (L) 02/26/2021   MCV 93.9 02/26/2021   PLT  103 (L) 02/26/2021    Lab Results  Component Value Date   CREATININE 1.89 (H) 02/26/2021   BUN 22 02/26/2021   NA 136 02/26/2021   K 4.0 02/26/2021   CL 102 02/26/2021  CO2 27 02/26/2021    Lab Results  Component Value Date   ALT 23 02/26/2021   AST 29 02/26/2021   ALKPHOS 105 02/26/2021   BILITOT 0.9 02/26/2021    Lab Results  Component Value Date   INR 1.4 (H) 02/17/2021   INR 1.1 10/14/2020   INR 1.2 05/04/2020      Physical Exam: Vital signs BP 132/73    Pulse 88    Temp 98.4 F (36.9 C) (Oral)    Resp 15    Ht 5\' 6"  (1.676 m)    Wt 73.9 kg    SpO2 94%    BMI 26.31 kg/m   General:   Alert,  Well-developed, pleasant and cooperative in NAD Lungs:  Clear throughout to auscultation.   Heart:  Regular rate and rhythm Abdomen:  Soft, nontender and nondistended.   Neuro/Psych:  Alert and cooperative. Normal mood and affect. A and O x 3  Jolly Mango, MD [facility]

## 2021-03-31 NOTE — Op Note (Signed)
[facility] Patient Name: Corey Wood Procedure Date: 03/31/2021 MRN: [id number] Attending MD: Carlota Raspberry. Havery Moros , MD Date of Birth: 1951-02-22 CSN: [id number] Age: 71 Admit Type: Outpatient Procedure:                Upper GI endoscopy Indications:              For therapy of esophageal varices - history of                            large esophageal varices, s/p banding - total of 32                            bands placed so far, intolerant of beta blockade.                            Last exam November 2022. Incidentally noted to have                            some nausea / vomiting at times, have been                            concerned about possible underlying gastroparesis                            in the past Providers:                Remo Lipps P. Havery Moros, MD, Kary Kos RN, RN,                            Frazier Richards, Technician Referring MD:              Medicines:                Monitored Anesthesia Care Complications:            No immediate complications. Estimated blood loss:                            None. Estimated Blood Loss:     Estimated blood loss: none. Procedure:                Pre-Anesthesia Assessment:                           - Prior to the procedure, a History and Physical                            was performed, and patient medications and                            allergies were reviewed. The patient's tolerance of                            previous anesthesia was also reviewed. The risks  and benefits of the procedure and the sedation                            options and risks were discussed with the patient.                            All questions were answered, and informed consent                            was obtained. Prior Anticoagulants: The patient has                            taken no previous anticoagulant or antiplatelet                            agents. ASA Grade Assessment: III - A  patient with                            severe systemic disease. After reviewing the risks                            and benefits, the patient was deemed in                            satisfactory condition to undergo the procedure.                           After obtaining informed consent, the endoscope was                            passed under direct vision. Throughout the                            procedure, the patient's blood pressure, pulse, and                            oxygen saturations were monitored continuously. The                            [id number]-H190 ([id number]) Olympus endoscope was introduced                            through the mouth, and advanced to the second part                            of duodenum. The upper GI endoscopy was                            accomplished without difficulty. The patient                            tolerated the procedure well. Scope In: Scope Out: Findings:      The Z-line was regular.  Varices were found in the middle third of the esophagus and in the lower       third of the esophagus. They were medium in size. No high risk stigmata       noted. I intended to place bands today given varices not yet eradicated,       however food noted in the stomach and with aspiration risk banding not       performed, very quick exam performed to ensure no gastric outlet       obstruction and procedure terminated without banding.      The exam of the esophagus was otherwise normal.      A medium to large amount of food (residue) was found in the gastric       fundus and in the gastric body.      Portal hypertensive gastropathy was found in the stomach.      The exam of the stomach was otherwise normal however limited views in       proximal stomach due to retained food. Pylorus was patent, no gastric       outlet obstruction. Retroflexed views not obtained due to aspiration       risk.      The duodenal bulb and second portion of the duodenum  were normal       although very quick exam performed and time not spent in the duodenum       given food in the stomach. Impression:               - Z-line regular.                           - Esophageal varices - improved compared to                            previous however not yet eradicated and more                            banding is warranted to eradicate the varices, but                            not performed today due to aspiration risk and                            residual food in the stomach.                           - A medium amount of food (residue) in the stomach.                           - Portal hypertensive gastropathy.                           - No gastric outlet obstruction                           - Normal duodenal bulb and second portion of the  duodenum. Moderate Sedation:      No moderate sedation, case performed with MAC Recommendation:           - Patient has a contact number available for                            emergencies. The signs and symptoms of potential                            delayed complications were discussed with the                            patient. Return to normal activities tomorrow.                            Written discharge instructions were provided to the                            patient.                           - Resume previous diet.                           - Continue present medications.                           - Repeat upper endoscopy for retreatment in next                            1-2 months - our office will contact to coordinate                           - Liquid meal for dinner night prior to next exam.                            Had discussed obtaining most recent EKG for this                            patient in regards to QTc - had been slightly                            prolonged remotely and have not got most recent EKG                            to clarify if he is a  candidate for Reglan (request                            previously sent but did not come in). Will discuss                            with the patient and wife, suspect underlying  gastroparesis given symptoms Procedure Code(s):        --- Professional ---                           [phone], Esophagogastroduodenoscopy, flexible,                            transoral; diagnostic, including collection of                            specimen(s) by brushing or washing, when performed                            (separate procedure) Diagnosis Code(s):        --- Professional ---                           I85.00, Esophageal varices without bleeding                           K76.6, Portal hypertension                           K31.89, Other diseases of stomach and duodenum CPT copyright 2019 [employer] Association. All rights reserved. The codes documented in this report are preliminary and upon coder review may  be revised to meet current compliance requirements. Remo Lipps P. Syleena Mchan, MD 03/31/2021 7:52:32 AM This report has been signed electronically. Number of Addenda: 0

## 2021-04-01 ENCOUNTER — Other Ambulatory Visit: Payer: Self-pay

## 2021-04-01 ENCOUNTER — Telehealth: Payer: Self-pay | Admitting: Gastroenterology

## 2021-04-01 DIAGNOSIS — K7460 Unspecified cirrhosis of liver: Secondary | ICD-10-CM

## 2021-04-01 DIAGNOSIS — I8501 Esophageal varices with bleeding: Secondary | ICD-10-CM

## 2021-04-01 NOTE — Progress Notes (Signed)
Order placed for EGD with banding of varices with Dr. Owens Loffler at [facility] on Tuesday, 04-21-21. Patient of Dr. Doyne Keel.  Patient will need a clear liquid diet the night before due to residual food in the stomach at EGD performed yesterday, 1-3.

## 2021-04-01 NOTE — Telephone Encounter (Signed)
Called and spoke with patient's wife, she states that pt has a constant dry cough. No blood, non-productive. She states that the diabetic tussin usually helps but it is not this time. Please advise, thanks.

## 2021-04-01 NOTE — Telephone Encounter (Signed)
Called and spoke with patient's wife in regards to recommendations. She denied that pt was having any chest pain, SOB, or fever. I advised her to reach out to patient's PCP if the home medication is not working. Pt's wife knows that he will need to go the ED if he has any urgent symptoms. Pt's wife verbalized understanding and had no concerns at the end of the call.

## 2021-04-01 NOTE — Telephone Encounter (Signed)
Sorry to hear this. Cough only, no shortness of breath, chest pain, fevers, etc? Procedure was aborted and very quick exam due to food in the stomach, there was no witnessed aspiration.  He may want to contact his PCP about this, I didn't see anything obvious for the procedure to cause this. If he is having breathing difficulty, chest pain, fever, etc we can do a CXR PA/LAT or he may need to go to the ED if more urgent symptoms in the interim. He can continue his home cough medication otherwise.

## 2021-04-01 NOTE — Telephone Encounter (Signed)
Patients wife called stating that patient had a procedure done yesterday, and he has been coughing non stop since yesterday. Seeking advice if there is anything he can take to help. Please advise.

## 2021-04-02 ENCOUNTER — Other Ambulatory Visit: Payer: Self-pay

## 2021-04-02 DIAGNOSIS — K7460 Unspecified cirrhosis of liver: Secondary | ICD-10-CM

## 2021-04-02 DIAGNOSIS — I8501 Esophageal varices with bleeding: Secondary | ICD-10-CM

## 2021-04-02 NOTE — Progress Notes (Signed)
Patient scheduled for Egd with Banding of varices at [facility] with Dr. Ardis Hughs at 9:15am

## 2021-04-07 DIAGNOSIS — K7469 Other cirrhosis of liver: Secondary | ICD-10-CM | POA: Diagnosis not present

## 2021-04-08 ENCOUNTER — Telehealth: Payer: Self-pay | Admitting: Gastroenterology

## 2021-04-08 DIAGNOSIS — R053 Chronic cough: Secondary | ICD-10-CM

## 2021-04-08 NOTE — Telephone Encounter (Signed)
Patients wife called states the patient has developed a chronic cough and she is seeking help

## 2021-04-08 NOTE — Telephone Encounter (Signed)
Returned call to patient's wife. She states that pt still has a dry cough. She describes the cough as if the patient has a tickle/irritation in is throat. She states that she is at the bottom of the 3rd diabetic tussin bottle and he is still coughing. Pt has tried several different things like cough droops, peppermints, teas, etc. Pt's wife denies that he has any SOB or chest pain. I asked if she reached out to pt's PCP like previously recommended, she states that she has reached out to his PCP at the [place] but has not heard anything back. She states that he does not have a local provider that he sees in cases like this. Pt's wife is seeking further advice. Thanks

## 2021-04-08 NOTE — Telephone Encounter (Signed)
Called and spoke with patient's wife in regards to recommendations. She states that she will reach out to the [place] again with the hopes that someone will reach back out to her. She is aware that we have ordered a chest x-ray and they can stop by the x-ray dept in the basement without an appt either Thursday or Friday this week, anytime before 5 pm. Pt's wife verbalized understanding and had no concerns at the end of the call.  Chest x-ray order in epic.

## 2021-04-08 NOTE — Telephone Encounter (Signed)
I agree that they should reach out to their PCP for this for primary management. We can do a CXR PA/LAT in the interim to make sure everything is okay if you can help order it. Thanks. If any shortness of breath, chest pain, etc, they need to call us.

## 2021-04-10 ENCOUNTER — Encounter (HOSPITAL_COMMUNITY): Payer: Self-pay | Admitting: Gastroenterology

## 2021-04-21 ENCOUNTER — Other Ambulatory Visit: Payer: Self-pay

## 2021-04-21 ENCOUNTER — Ambulatory Visit (HOSPITAL_COMMUNITY): Payer: No Typology Code available for payment source | Admitting: Anesthesiology

## 2021-04-21 ENCOUNTER — Encounter (HOSPITAL_COMMUNITY)
Admission: RE | Disposition: A | Discharge status: Home / Self Care | Payer: Self-pay | Source: Home / Self Care | Attending: Gastroenterology

## 2021-04-21 ENCOUNTER — Ambulatory Visit (HOSPITAL_COMMUNITY)
Admission: RE | Admit: 2021-04-21 | Discharge: 2021-04-21 | Disposition: A | Discharge status: Home / Self Care | Payer: No Typology Code available for payment source | Attending: Gastroenterology | Admitting: Gastroenterology

## 2021-04-21 ENCOUNTER — Encounter (HOSPITAL_COMMUNITY): Payer: Self-pay | Admitting: Gastroenterology

## 2021-04-21 DIAGNOSIS — K7460 Unspecified cirrhosis of liver: Secondary | ICD-10-CM

## 2021-04-21 DIAGNOSIS — I8510 Secondary esophageal varices without bleeding: Secondary | ICD-10-CM

## 2021-04-21 DIAGNOSIS — I129 Hypertensive chronic kidney disease with stage 1 through stage 4 chronic kidney disease, or unspecified chronic kidney disease: Secondary | ICD-10-CM | POA: Diagnosis not present

## 2021-04-21 DIAGNOSIS — K219 Gastro-esophageal reflux disease without esophagitis: Secondary | ICD-10-CM | POA: Insufficient documentation

## 2021-04-21 DIAGNOSIS — G4733 Obstructive sleep apnea (adult) (pediatric): Secondary | ICD-10-CM | POA: Insufficient documentation

## 2021-04-21 DIAGNOSIS — Z794 Long term (current) use of insulin: Secondary | ICD-10-CM | POA: Insufficient documentation

## 2021-04-21 DIAGNOSIS — G20 Parkinson's disease: Secondary | ICD-10-CM | POA: Insufficient documentation

## 2021-04-21 DIAGNOSIS — D759 Disease of blood and blood-forming organs, unspecified: Secondary | ICD-10-CM | POA: Insufficient documentation

## 2021-04-21 DIAGNOSIS — K3189 Other diseases of stomach and duodenum: Secondary | ICD-10-CM | POA: Diagnosis not present

## 2021-04-21 DIAGNOSIS — F32A Depression, unspecified: Secondary | ICD-10-CM | POA: Diagnosis not present

## 2021-04-21 DIAGNOSIS — E1122 Type 2 diabetes mellitus with diabetic chronic kidney disease: Secondary | ICD-10-CM | POA: Insufficient documentation

## 2021-04-21 DIAGNOSIS — F419 Anxiety disorder, unspecified: Secondary | ICD-10-CM | POA: Insufficient documentation

## 2021-04-21 DIAGNOSIS — I8501 Esophageal varices with bleeding: Secondary | ICD-10-CM

## 2021-04-21 DIAGNOSIS — J449 Chronic obstructive pulmonary disease, unspecified: Secondary | ICD-10-CM | POA: Insufficient documentation

## 2021-04-21 DIAGNOSIS — D649 Anemia, unspecified: Secondary | ICD-10-CM | POA: Insufficient documentation

## 2021-04-21 DIAGNOSIS — N183 Chronic kidney disease, stage 3 unspecified: Secondary | ICD-10-CM | POA: Insufficient documentation

## 2021-04-21 HISTORY — PX: ESOPHAGOGASTRODUODENOSCOPY (EGD) WITH PROPOFOL: SHX5813

## 2021-04-21 HISTORY — PX: ESOPHAGEAL BANDING: SHX5518

## 2021-04-21 LAB — GLUCOSE, CAPILLARY
Glucose-Capillary: 137 mg/dL — ABNORMAL HIGH (ref 70–99)
Glucose-Capillary: 125 mg/dL — ABNORMAL HIGH (ref 70–99)

## 2021-04-21 SURGERY — ESOPHAGOGASTRODUODENOSCOPY (EGD) WITH PROPOFOL
Anesthesia: Monitor Anesthesia Care

## 2021-04-21 MED ORDER — PROPOFOL 500 MG/50ML IV EMUL
INTRAVENOUS | Status: DC | PRN
  Administered 2021-04-21: 100 ug/kg/min via INTRAVENOUS

## 2021-04-21 MED ORDER — PROPOFOL 10 MG/ML IV BOLUS
INTRAVENOUS | Status: DC | PRN
  Administered 2021-04-21: 10 mg via INTRAVENOUS
  Administered 2021-04-21: 20 mg via INTRAVENOUS

## 2021-04-21 MED ORDER — IPRATROPIUM-ALBUTEROL 0.5-2.5 (3) MG/3ML IN SOLN
RESPIRATORY_TRACT | Status: AC
  Filled 2021-04-21: qty 3

## 2021-04-21 MED ORDER — PROPOFOL 500 MG/50ML IV EMUL
INTRAVENOUS | Status: AC
  Filled 2021-04-21: qty 50

## 2021-04-21 MED ORDER — LACTATED RINGERS IV SOLN: INTRAVENOUS | Status: DC

## 2021-04-21 MED ORDER — LACTATED RINGERS IV SOLN: INTRAVENOUS | Status: DC | PRN

## 2021-04-21 MED ORDER — EPHEDRINE SULFATE-NACL 50-0.9 MG/10ML-% IV SOSY
PREFILLED_SYRINGE | INTRAVENOUS | Status: DC | PRN
  Administered 2021-04-21: 5 mg via INTRAVENOUS

## 2021-04-21 MED ORDER — CHLORHEXIDINE GLUCONATE 0.12 % MT SOLN
15.0000 mL | Freq: Once | OROMUCOSAL | Status: DC
  Filled 2021-04-21: qty 15

## 2021-04-21 MED ORDER — ORAL CARE MOUTH RINSE: 15.0000 mL | Freq: Once | OROMUCOSAL | Status: DC

## 2021-04-21 MED ORDER — LIDOCAINE 2% (20 MG/ML) 5 ML SYRINGE
INTRAMUSCULAR | Status: DC | PRN
  Administered 2021-04-21: 40 mg via INTRAVENOUS
  Administered 2021-04-21: 60 mg via INTRAVENOUS

## 2021-04-21 MED ORDER — SODIUM CHLORIDE 0.9 % IV SOLN: INTRAVENOUS | Status: DC

## 2021-04-21 MED ORDER — PROPOFOL 10 MG/ML IV BOLUS
INTRAVENOUS | Status: AC
  Filled 2021-04-21: qty 20

## 2021-04-21 MED ORDER — IPRATROPIUM-ALBUTEROL 0.5-2.5 (3) MG/3ML IN SOLN
3.0000 mL | Freq: Once | RESPIRATORY_TRACT | Status: AC
  Administered 2021-04-21: 10:00:00 3 mL via RESPIRATORY_TRACT

## 2021-04-21 SURGICAL SUPPLY — 15 items

## 2021-04-21 NOTE — Op Note (Signed)
[facility] Patient Name: Corey Wood Procedure Date: 04/21/2021 MRN: [id number] Attending MD: Milus Banister , MD Date of Birth: [birth year] CSN: [id number] Age: 71 Admit Type: Outpatient Procedure:                Upper GI endoscopy Indications:              Cirrhosis, known esophageal varices. Last bands                            placed 01/2021 Dr. Hilarie Fredrickson, aborted EGD banding                            02/2021 due to large amount of food in the stomach Providers:                Milus Banister, MD, Kary Kos RN, RN, Frazier Richards, Technician Referring MD:             Jolly Mango, MD Medicines:                Monitored Anesthesia Care Complications:            No immediate complications. Estimated blood loss:                            None. Estimated Blood Loss:     Estimated blood loss: none. Procedure:                Pre-Anesthesia Assessment:                           - Prior to the procedure, a History and Physical                            was performed, and patient medications and                            allergies were reviewed. The patient's tolerance of                            previous anesthesia was also reviewed. The risks                            and benefits of the procedure and the sedation                            options and risks were discussed with the patient.                            All questions were answered, and informed consent                            was obtained. Prior Anticoagulants: The patient has  taken no previous anticoagulant or antiplatelet                            agents. ASA Grade Assessment: IV - A patient with                            severe systemic disease that is a constant threat                            to life. After reviewing the risks and benefits,                            the patient was deemed in satisfactory condition to                             undergo the procedure.                           After obtaining informed consent, the endoscope was                            passed under direct vision. Throughout the                            procedure, the patient's blood pressure, pulse, and                            oxygen saturations were monitored continuously. The                            [id number]-[id number] ([id number]) Olympus endoscope was introduced                            through the mouth, and advanced to the second part                            of duodenum. The upper GI endoscopy was                            accomplished without difficulty. The patient                            tolerated the procedure well. Scope In: Scope Out: Findings:      Three trunks of Grade II varices were found in the distal esophagus with       scar evidence of previous banding procedures. Six bands were       successfully placed with complete eradication, resulting in deflation of       varices. There was no bleeding during the procedure.      Mild portal gastropathy changes throughout the stomach.      No gastric varices      The exam was otherwise without abnormality. Impression:               - Three trunks of Grade II varices were found in  the distal esophagus with scar evidence of previous                            banding procedures. Six bands were successfully                            placed with complete eradication, resulting in                            deflation of varices. There was no bleeding during                            the procedure.                           - Mild portal gastropathy. No gastric varices. Moderate Sedation:      Not Applicable - Patient had care per Anesthesia. Recommendation:           - Patient has a contact number available for                            emergencies. The signs and symptoms of potential                            delayed complications were  discussed with the                            patient. Return to normal activities tomorrow.                            Written discharge instructions were provided to the                            patient.                           - Resume previous diet.                           - Continue present medications.                           - Repeat EGD for banding per Dr. Havery Moros, likely                            in 1-2 months. Procedure Code(s):        --- Professional ---                           [phone], Esophagogastroduodenoscopy, flexible,                            transoral; with band ligation of esophageal/gastric                            varices Diagnosis Code(s):        ---  Professional ---                           I85.00, Esophageal varices without bleeding CPT copyright 2019 American Medical Association. All rights reserved. The codes documented in this report are preliminary and upon coder review may  be revised to meet current compliance requirements. Milus Banister, MD 04/21/2021 9:48:30 AM This report has been signed electronically. Number of Addenda: 0

## 2021-04-21 NOTE — Discharge Instructions (Signed)
YOU HAD AN ENDOSCOPIC PROCEDURE TODAY: Refer to the procedure report and other information in the discharge instructions given to you for any specific questions about what was found during the examination. If this information does not answer your questions, please call [place] office at [phone] to clarify.  ° °YOU SHOULD EXPECT: Some feelings of bloating in the abdomen. Passage of more gas than usual. Walking can help get rid of the air that was put into your GI tract during the procedure and reduce the bloating. If you had a lower endoscopy (such as a colonoscopy or flexible sigmoidoscopy) you may notice spotting of blood in your stool or on the toilet paper. Some abdominal soreness may be present for a day or two, also. ° °DIET: Your first meal following the procedure should be a light meal and then it is ok to progress to your normal diet. A half-sandwich or bowl of soup is an example of a good first meal. Heavy or fried foods are harder to digest and may make you feel nauseous or bloated. Drink plenty of fluids but you should avoid alcoholic beverages for 24 hours. If you had a esophageal dilation, please see attached instructions for diet.   ° °ACTIVITY: Your care partner should take you home directly after the procedure. You should plan to take it easy, moving slowly for the rest of the day. You can resume normal activity the day after the procedure however YOU SHOULD NOT DRIVE, use power tools, machinery or perform tasks that involve climbing or major physical exertion for 24 hours (because of the sedation medicines used during the test).  ° °SYMPTOMS TO REPORT IMMEDIATELY: °A gastroenterologist can be reached at any hour. Please call [phone]  for any of the following symptoms:  °Following lower endoscopy (colonoscopy, flexible sigmoidoscopy) °Excessive amounts of blood in the stool  °Significant tenderness, worsening of abdominal pains  °Swelling of the abdomen that is new, acute  °Fever of 100° or  higher  °Following upper endoscopy (EGD, EUS, ERCP, esophageal dilation) °Vomiting of blood or coffee ground material  °New, significant abdominal pain  °New, significant chest pain or pain under the shoulder blades  °Painful or persistently difficult swallowing  °New shortness of breath  °Black, tarry-looking or red, bloody stools ° °FOLLOW UP:  °If any biopsies were taken you will be contacted by phone or by letter within the next 1-3 weeks. Call [phone]  if you have not heard about the biopsies in 3 weeks.  °Please also call with any specific questions about appointments or follow up tests. ° °

## 2021-04-21 NOTE — Progress Notes (Addendum)
RN updated wife of patient status. Wife notified that patient broke a rib last week and that patients oxygen saturation is between 90/91 at home on room air.

## 2021-04-21 NOTE — Interval H&P Note (Signed)
History and Physical Interval Note:  04/21/2021 8:32 AM  Corey Wood  has presented today for surgery, with the diagnosis of cirrhosis with esophageal varices.  The various methods of treatment have been discussed with the patient and family. After consideration of risks, benefits and other options for treatment, the patient has consented to  Procedure(s): ESOPHAGOGASTRODUODENOSCOPY (EGD) WITH PROPOFOL (N/A) ESOPHAGEAL BANDING (N/A) as a surgical intervention.  The patient's history has been reviewed, patient examined, no change in status, stable for surgery.  I have reviewed the patient's chart and labs.  Questions were answered to the patient's satisfaction.     Milus Banister

## 2021-04-21 NOTE — Progress Notes (Signed)
Patients oxygen saturation was in the upper 80's. Dr. Roanna Banning came by and evaluated patient. Dr. Roanna Banning instructed RN to place nasal cannula and verbally ordered duo-neb.

## 2021-04-21 NOTE — Progress Notes (Signed)
Dr. Roanna Banning at bedside. Patient on room air with O2 sats between 90-93%. Patient's baseline per wife is 90-93%. MDA ok with discharge with pulmonary follow up and IS. Education completed on use of IS.

## 2021-04-21 NOTE — Transfer of Care (Signed)
Immediate Anesthesia Transfer of Care Note  Patient: Corey Wood  Procedure(s) Performed: ESOPHAGOGASTRODUODENOSCOPY (EGD) WITH PROPOFOL ESOPHAGEAL BANDING  Patient Location: PACU and Endoscopy Unit  Anesthesia Type:MAC  Level of Consciousness: awake, drowsy and patient cooperative  Airway & Oxygen Therapy: Patient Spontanous Breathing and Patient connected to face mask oxygen  Post-op Assessment: Report given to RN and Post -op Vital signs reviewed and stable  Post vital signs: Reviewed and stable  Last Vitals:  Vitals Value Taken Time  BP 128/58 04/21/21 0952  Temp    Pulse 74 04/21/21 0954  Resp 22 04/21/21 0954  SpO2 100 % 04/21/21 0954  Vitals shown include unvalidated device data.  Last Pain:  Vitals:   04/21/21 0812  TempSrc: Oral  PainSc: 0-No pain         Complications: No notable events documented.

## 2021-04-21 NOTE — Anesthesia Procedure Notes (Signed)
Procedure Name: MAC Date/Time: 04/21/2021 9:20 AM Performed by: Eben Burow, CRNA Pre-anesthesia Checklist: Patient identified, Emergency Drugs available, Suction available, Patient being monitored and Timeout performed Oxygen Delivery Method: Simple face mask Placement Confirmation: positive ETCO2

## 2021-04-21 NOTE — Anesthesia Postprocedure Evaluation (Signed)
Anesthesia Post Note  Patient: SALIK GREWELL  Procedure(s) Performed: ESOPHAGOGASTRODUODENOSCOPY (EGD) WITH PROPOFOL ESOPHAGEAL BANDING     Patient location during evaluation: Endoscopy Anesthesia Type: MAC Level of consciousness: awake Pain management: pain level controlled Vital Signs Assessment: post-procedure vital signs reviewed and stable Respiratory status: spontaneous breathing, nonlabored ventilation and respiratory function stable Cardiovascular status: stable and blood pressure returned to baseline Postop Assessment: no apparent nausea or vomiting Anesthetic complications: no Comments: Oxygen saturation returned to baseline following IS, nebulizers, and prolonged PACU stay. Discussed post procedure events with patient and wife. Both feel comfortable with discharge from the facility.    No notable events documented.  Last Vitals:  Vitals:   04/21/21 1140 04/21/21 1149  BP: 117/73 130/65  Pulse: 73 92  Resp: 15 19  Temp:    SpO2: 91%     Last Pain:  Vitals:   04/21/21 1149  TempSrc:   PainSc: 2                  Pratham Cassatt P Brandon Scarbrough

## 2021-04-21 NOTE — Anesthesia Preprocedure Evaluation (Addendum)
Anesthesia Evaluation  Patient identified by MRN, date of birth, ID band Patient awake    Reviewed: Allergy & Precautions, NPO status , Patient's Chart, lab work & pertinent test results  Airway Mallampati: II  TM Distance: >3 FB Neck ROM: Full    Dental no notable dental hx.    Pulmonary asthma , sleep apnea and Continuous Positive Airway Pressure Ventilation , COPD,  COPD inhaler,    Pulmonary exam normal breath sounds clear to auscultation       Cardiovascular hypertension, Normal cardiovascular exam Rhythm:Regular Rate:Normal     Neuro/Psych PSYCHIATRIC DISORDERS Anxiety Depression Parkinsonism   Neuromuscular disease CVA    GI/Hepatic GERD  Medicated and Controlled,(+) Cirrhosis   Esophageal Varices    ,   Endo/Other  diabetes, Insulin DependentHypothyroidism   Renal/GU Renal disease     Musculoskeletal  (+) Arthritis ,   Abdominal   Peds  Hematology  (+) Blood dyscrasia, anemia ,   Anesthesia Other Findings   Reproductive/Obstetrics                            Anesthesia Physical Anesthesia Plan  ASA: 3  Anesthesia Plan: MAC   Post-op Pain Management:    Induction: Intravenous  PONV Risk Score and Plan: 1 and Propofol infusion and Treatment may vary due to age or medical condition  Airway Management Planned: Nasal Cannula  Additional Equipment:   Intra-op Plan:   Post-operative Plan:   Informed Consent: I have reviewed the patients History and Physical, chart, labs and discussed the procedure including the risks, benefits and alternatives for the proposed anesthesia with the patient or authorized representative who has indicated his/her understanding and acceptance.     Dental advisory given  Plan Discussed with: CRNA  Anesthesia Plan Comments:         Anesthesia Quick Evaluation

## 2021-04-22 ENCOUNTER — Encounter (HOSPITAL_COMMUNITY): Payer: Self-pay | Admitting: Gastroenterology

## 2021-06-30 ENCOUNTER — Encounter: Payer: Self-pay | Admitting: Gastroenterology

## 2021-06-30 ENCOUNTER — Ambulatory Visit (INDEPENDENT_AMBULATORY_CARE_PROVIDER_SITE_OTHER): Payer: No Typology Code available for payment source | Admitting: Gastroenterology

## 2021-06-30 VITALS — BP 108/64 | HR 65 | Ht 66.0 in | Wt 165.0 lb

## 2021-06-30 DIAGNOSIS — K7460 Unspecified cirrhosis of liver: Secondary | ICD-10-CM | POA: Diagnosis not present

## 2021-06-30 DIAGNOSIS — I8510 Secondary esophageal varices without bleeding: Secondary | ICD-10-CM | POA: Diagnosis not present

## 2021-06-30 NOTE — Progress Notes (Signed)
? ?HPI :  ?71 year old male with a history of cirrhosis, here for follow-up visit for this.  ?  ?History of cryptogenic cirrhosis, thought to be due to NASH.  Has been followed by hepatology with [place] in the past, followed also by multiple subspecialists at the [facility] for other medical problems.  ? ?Recall he has had a complicated course since have started following him.  Recall that he had a screening EGD that showed very large esophageal varices and GOV 2 gastric varices, portal hypertensive gastritis.  Initially treated with propranolol but developed symptomatic bradycardia and this was stopped. He elected for primary therapy of large varices to be done with band ligation in this light. Unfortunately his course has been complicated by a post banding ulcer bleeding. He has had multiple admissions for bleeding. In all he has had a total of 38 bands placed for treatment, his last EGD was in January of this year with Dr. Ardis Hughs. ? ?He has had other issues aside of his cirrhosis that has been managed.  He had issue with pleural effusion and hypoxia at 1 point in time.  He has been seen by neurology for balance issues and frequent falls, he fractured his vertebrae.  He had a kyphoplasty.  This past April he had a stroke and was admitted to [facility].  Since have last seen him he was diagnosed with Parkinson's by his neurologist, awaiting to start medical therapy for this.  His wife reports despite using walker his balance remains quite poor and he has had multiple falls, he falls about once per week.  He has several bruises on his body. ?   ?There was a question of possible hepatic encephalopathy in the past, he has not had overt encephalopathy.  He was placed on lactulose for this in the past however he just does not tolerate it due to diarrhea and his fall risk.  He has not been taking lactulose.  He is on a stable dosing of Aldactone and Lasix which she is compliant with and states this controls  his edema.  He has not had any issues with bleeding since I have last seen him.  He states he was seen by [facility] hepatology in recent weeks and no changes were made to his regimen. ?  ?Prior workup: ?EGD 01/30/20 - Esophagogastric landmarks identified. ?- Large esophageal varices as described. ?- Erythematous mucosa in the gastric fundus and antrum, suspect portal hypertensive gastritis. ?Biopsies taken to rule out H pylori. ?- Suspected small type 2 gastroesophageal varices (GOV2, esophageal varices which extend ?along the fundus). ?- Normal duodenal bulb and second portion of the duodenum, very mild superficial erythema ?Noted. ?  ?Colonoscopy 01/30/20 - The perianal and digital rectal examinations were normal. ?- The terminal ileum appeared normal. ?- A 5 to 6 mm polyp was found in the transverse colon. The polyp was flat. The polyp was ?removed with a cold snare. Resection and retrieval were complete. ?- Moderately congested mucosa was found in the entire colon, suspected due to portal ?hypertension. ?- Internal hemorrhoids were found during retroflexion. ?- The exam was otherwise without abnormality. ?  ?1. Surgical [P], gastric antrum and gastric body ?- CHRONIC GASTRITIS. ?- WARTHIN-STARRY IS NEGATIVE FOR HELICOBACTER PYLORI. ?- NO INTESTINAL METAPLASIA, DYSPLASIA, OR MALIGNANCY. ?2. Surgical [P], colon, transverse, polyp ?- TUBULAR ADENOMA. ?- NO HIGH GRADE DYSPLASIA OR MALIGNANCY. ?  ?  ?EGD 04/17/20 - Esophagogastric landmarks identified. ?- Large esophageal varices. Banded x 11. ?- Type 2 gastroesophageal  varices (GOV2, esophageal varices which extend along the ?fundus). ?- Erythematous mucosa in the antrum. ?- Normal duodenal bulb and second portion of the duodenum. ?  ?EGD 04/25/20 - Large (> 5 mm) esophageal varices. ?- Esophageal ulcers with no stigmata of recent bleeding. ?- Portal hypertensive gastropathy. ?- A medium amount of food (residue) in the stomach. ?- Normal examined duodenum. ?- No  specimens collected. ?  ?EGD 04/30/20 - No gross lesions in esophagus proximally. ?- Grade I, grade II and grade III esophageal varices as well as multiple post-banding ulcers ?with a few ulcers showing active oozing. Near completely eradicated after 6 bands were ?placed. No active extravasation noted thereafter. ?- Clotted blood in the entire stomach - suctioned and lavaged with mild clearance and did ?not see active re-accumulation of bleeding. ?- Type 2 gastroesophageal varices (GOV2, esophageal varices which extend along the ?fundus) - not completely visualized as had been at time of first endoscopy but no sing of ?active bleeding, but recent stigmata or nipple sign could have been missed due to blood in ?fundus. ?- Blood in the duodenal bulb and in the second portion of the duodenum - lavaged away. ?  ?Echo 05/01/20 - EF 60-65% ?  ?EGD 06/10/20 - Esophagogastric landmarks identified. ?- Large esophageal varices but improved overall in size / appearance compared to initial ?exam. Banded x 6 in the lower to mid esophagus. ?- Type 2 gastroesophageal varices (GOV2, esophageal varices which extend along the ?fundus). ?- Portal hypertensive gastropathy. ?- Normal duodenal bulb and second portion of the duodenum. ?  ?Patient had a CVA in April, follow up EGD was cancelled ?  ?Admitted to [facility] 5/21/08/30/20 for volume overload ?  ?EGD 11/10/20 - Esophagogastric landmarks identified. ?- Esophageal varices as described above - improvement in size over time but columns still ?persist, mostly medium in size, one large column. Banded x 6. ?- Portal hypertensive gastropathy. ?- Normal duodenal bulb and second portion of the duodenum. ?  ? ?EGD 02/10/21 - Grade II and small (< 5 mm) esophageal varices with no bleeding and no stigmata of recent bleeding. Banded x 3 today with good treatment response. Overall excellent ?response to EVL with now small varices. ?- Portal hypertensive gastropathy. No gastric varices seen. ?-  Normal examined duodenum. ?  ?RUQ [place] 09/23/20 - IMPRESSION: ?Cirrhotic liver morphology.  No focal hepatic lesion. ?  ?  ?EGD 03/31/21 - retained food in the stomach ? ?EGD 04/21/21 - Dr. Ardis Hughs ?Three trunks of Grade II varices were found in the distal esophagus with scar evidence of previous ?banding procedures. Six bands were successfully placed with complete eradication, resulting in deflation ?of varices. There was no bleeding during the procedure. ?Mild portal gastropathy changes throughout the stomach. ?No gastric varices ? ?RUQ 02/27/21: ?IMPRESSION: ?1. Cirrhotic morphology liver.  No sonographic evidence of hepatoma. ?2. Common bile duct dilation, likely post cholecystectomy ?distention. ? ? ?Past Medical History:  ?Diagnosis Date  ? Allergy   ? seasonal allergies  ? Anxiety   ? on meds  ? Aortic valve disorder   ? Arthritis   ? generalized (fingers)(shoulders)  ? Asthma   ? uses inhaler  ? Cataract   ? bilateral -sx   ? Cirrhosis ([place])   ? CKD (chronic kidney disease), stage III ([place])   ? only has one kidney  ? COPD (chronic obstructive pulmonary disease) ([place])   ? Depression   ? on meds  ? DM (diabetes mellitus) ([place])   ? on meds  ?  Dyspnea   ? GERD (gastroesophageal reflux disease)   ? on meds  ? Heart murmur   ? History of colon polyps   ? History of COVID-19 10/14/2020  ? History of kidney stones   ? Hx of acute pancreatitis 10/2019  ? Hyperlipidemia   ? on meds  ? Hypertension   ? on meds  ? Hypothyroidism   ? not on meds at this time  ? Myelodysplastic syndrome ([place])   ? Neuromuscular disorder ([place])   ? per pt  ? Obstructive sleep apnea   ? Oxygen deficiency   ? Parkinsonism ([place])   ? per pt report  ? Peripheral edema   ? bilateral legs  ? Peripheral positional vertigo   ? Sleep apnea   ? No CPAP  ? Stroke [facility])   ? ? ? ?Past Surgical History:  ?Procedure Laterality Date  ? ANKLE SURGERY    ? CHOLECYSTECTOMY    ? ENDARTERECTOMY Left 04/29/2020  ? Procedure: CAROTID EXPLORATION removal of  central venous  catheter;  Surgeon: Rosetta Posner, MD;  Location: [place];  Service: Vascular;  Laterality: Left;  ? ESOPHAGEAL BANDING N/A 04/17/2020  ? Procedure: ESOPHAGEAL BANDING;  Surgeon: Yetta Flock, MD;  Windell Moulding

## 2021-06-30 NOTE — H&P (View-Only) (Signed)
? ?HPI :  ?71 year old male with a history of cirrhosis, here for follow-up visit for this.  ?  ?History of cryptogenic cirrhosis, thought to be due to NASH.  Has been followed by hepatology with [facility] in the past, followed also by multiple subspecialists at the [facility] for other medical problems.  ? ?Recall he has had a complicated course since have started following him.  Recall that he had a screening EGD that showed very large esophageal varices and GOV 2 gastric varices, portal hypertensive gastritis.  Initially treated with propranolol but developed symptomatic bradycardia and this was stopped. He elected for primary therapy of large varices to be done with band ligation in this light. Unfortunately his course has been complicated by a post banding ulcer bleeding. He has had multiple admissions for bleeding. In all he has had a total of 38 bands placed for treatment, his last EGD was in January of this year with Dr. Ardis Hughs. ? ?He has had other issues aside of his cirrhosis that has been managed.  He had issue with pleural effusion and hypoxia at 1 point in time.  He has been seen by neurology for balance issues and frequent falls, he fractured his vertebrae.  He had a kyphoplasty.  This past April he had a stroke and was admitted to [facility].  Since have last seen him he was diagnosed with Parkinson's by his neurologist, awaiting to start medical therapy for this.  His wife reports despite using walker his balance remains quite poor and he has had multiple falls, he falls about once per week.  He has several bruises on his body. ?   ?There was a question of possible hepatic encephalopathy in the past, he has not had overt encephalopathy.  He was placed on lactulose for this in the past however he just does not tolerate it due to diarrhea and his fall risk.  He has not been taking lactulose.  He is on a stable dosing of Aldactone and Lasix which she is compliant with and states this controls  his edema.  He has not had any issues with bleeding since I have last seen him.  He states he was seen by [facility] hepatology in recent weeks and no changes were made to his regimen. ?  ?Prior workup: ?EGD 01/30/20 - Esophagogastric landmarks identified. ?- Large esophageal varices as described. ?- Erythematous mucosa in the gastric fundus and antrum, suspect portal hypertensive gastritis. ?Biopsies taken to rule out H pylori. ?- Suspected small type 2 gastroesophageal varices (GOV2, esophageal varices which extend ?along the fundus). ?- Normal duodenal bulb and second portion of the duodenum, very mild superficial erythema ?Noted. ?  ?Colonoscopy 01/30/20 - The perianal and digital rectal examinations were normal. ?- The terminal ileum appeared normal. ?- A 5 to 6 mm polyp was found in the transverse colon. The polyp was flat. The polyp was ?removed with a cold snare. Resection and retrieval were complete. ?- Moderately congested mucosa was found in the entire colon, suspected due to portal ?hypertension. ?- Internal hemorrhoids were found during retroflexion. ?- The exam was otherwise without abnormality. ?  ?1. Surgical [P], gastric antrum and gastric body ?- CHRONIC GASTRITIS. ?- WARTHIN-STARRY IS NEGATIVE FOR HELICOBACTER PYLORI. ?- NO INTESTINAL METAPLASIA, DYSPLASIA, OR MALIGNANCY. ?2. Surgical [P], colon, transverse, polyp ?- TUBULAR ADENOMA. ?- NO HIGH GRADE DYSPLASIA OR MALIGNANCY. ?  ?  ?EGD 04/17/20 - Esophagogastric landmarks identified. ?- Large esophageal varices. Banded x 11. ?- Type 2 gastroesophageal  varices (GOV2, esophageal varices which extend along the ?fundus). ?- Erythematous mucosa in the antrum. ?- Normal duodenal bulb and second portion of the duodenum. ?  ?EGD 04/25/20 - Large (> 5 mm) esophageal varices. ?- Esophageal ulcers with no stigmata of recent bleeding. ?- Portal hypertensive gastropathy. ?- A medium amount of food (residue) in the stomach. ?- Normal examined duodenum. ?- No  specimens collected. ?  ?EGD 04/30/20 - No gross lesions in esophagus proximally. ?- Grade I, grade II and grade III esophageal varices as well as multiple post-banding ulcers ?with a few ulcers showing active oozing. Near completely eradicated after 6 bands were ?placed. No active extravasation noted thereafter. ?- Clotted blood in the entire stomach - suctioned and lavaged with mild clearance and did ?not see active re-accumulation of bleeding. ?- Type 2 gastroesophageal varices (GOV2, esophageal varices which extend along the ?fundus) - not completely visualized as had been at time of first endoscopy but no sing of ?active bleeding, but recent stigmata or nipple sign could have been missed due to blood in ?fundus. ?- Blood in the duodenal bulb and in the second portion of the duodenum - lavaged away. ?  ?Echo 05/01/20 - EF 60-65% ?  ?EGD 06/10/20 - Esophagogastric landmarks identified. ?- Large esophageal varices but improved overall in size / appearance compared to initial ?exam. Banded x 6 in the lower to mid esophagus. ?- Type 2 gastroesophageal varices (GOV2, esophageal varices which extend along the ?fundus). ?- Portal hypertensive gastropathy. ?- Normal duodenal bulb and second portion of the duodenum. ?  ?Patient had a CVA in April, follow up EGD was cancelled ?  ?Admitted to [facility] 5/21/08/30/20 for volume overload ?  ?EGD 11/10/20 - Esophagogastric landmarks identified. ?- Esophageal varices as described above - improvement in size over time but columns still ?persist, mostly medium in size, one large column. Banded x 6. ?- Portal hypertensive gastropathy. ?- Normal duodenal bulb and second portion of the duodenum. ?  ? ?EGD 02/10/21 - Grade II and small (< 5 mm) esophageal varices with no bleeding and no stigmata of recent bleeding. Banded x 3 today with good treatment response. Overall excellent ?response to EVL with now small varices. ?- Portal hypertensive gastropathy. No gastric varices seen. ?-  Normal examined duodenum. ?  ?RUQ [place] 09/23/20 - IMPRESSION: ?Cirrhotic liver morphology.  No focal hepatic lesion. ?  ?  ?EGD 03/31/21 - retained food in the stomach ? ?EGD 04/21/21 - Dr. Ardis Hughs ?Three trunks of Grade II varices were found in the distal esophagus with scar evidence of previous ?banding procedures. Six bands were successfully placed with complete eradication, resulting in deflation ?of varices. There was no bleeding during the procedure. ?Mild portal gastropathy changes throughout the stomach. ?No gastric varices ? ?RUQ 02/27/21: ?IMPRESSION: ?1. Cirrhotic morphology liver.  No sonographic evidence of hepatoma. ?2. Common bile duct dilation, likely post cholecystectomy ?distention. ? ? ?Past Medical History:  ?Diagnosis Date  ? Allergy   ? seasonal allergies  ? Anxiety   ? on meds  ? Aortic valve disorder   ? Arthritis   ? generalized (fingers)(shoulders)  ? Asthma   ? uses inhaler  ? Cataract   ? bilateral -sx   ? Cirrhosis ([place])   ? CKD (chronic kidney disease), stage III ([place])   ? only has one kidney  ? COPD (chronic obstructive pulmonary disease) ([place])   ? Depression   ? on meds  ? DM (diabetes mellitus) ([place])   ? on meds  ?  Dyspnea   ? GERD (gastroesophageal reflux disease)   ? on meds  ? Heart murmur   ? History of colon polyps   ? History of COVID-19 10/14/2020  ? History of kidney stones   ? Hx of acute pancreatitis 10/2019  ? Hyperlipidemia   ? on meds  ? Hypertension   ? on meds  ? Hypothyroidism   ? not on meds at this time  ? Myelodysplastic syndrome ([place])   ? Neuromuscular disorder ([place])   ? per pt  ? Obstructive sleep apnea   ? Oxygen deficiency   ? Parkinsonism ([place])   ? per pt report  ? Peripheral edema   ? bilateral legs  ? Peripheral positional vertigo   ? Sleep apnea   ? No CPAP  ? Stroke [facility])   ? ? ? ?Past Surgical History:  ?Procedure Laterality Date  ? ANKLE SURGERY    ? CHOLECYSTECTOMY    ? ENDARTERECTOMY Left 04/29/2020  ? Procedure: CAROTID EXPLORATION removal of  central venous  catheter;  Surgeon: Rosetta Posner, MD;  Location: [facility];  Service: Vascular;  Laterality: Left;  ? ESOPHAGEAL BANDING N/A 04/17/2020  ? Procedure: ESOPHAGEAL BANDING;  Surgeon: Yetta Flock, MD;  Windell Moulding

## 2021-06-30 NOTE — Patient Instructions (Signed)
If you are age 73 or older, your body mass index should be between 23-30. Your Body mass index is 26.63 kg/m?[name] Kitchen If this is out of the aforementioned range listed, please consider follow up with your Primary Care Provider. ? ?If you are age 25 or younger, your body mass index should be between 19-25. Your Body mass index is 26.63 kg/m?[name] Kitchen If this is out of the aformentioned range listed, please consider follow up with your Primary Care Provider.  ? ?________________________________________________________ ? ?The [facility] providers would like to encourage you to use [facility] to communicate with providers for non-urgent requests or questions.  Due to long hold times on the telephone, sending your provider a message by [facility] may be a faster and more efficient way to get a response.  Please allow 48 business hours for a response.  Please remember that this is for non-urgent requests.  ?_______________________________________________________ ? ?We will let you know when we have an opening at the hospital to schedule your Upper Endoscopy. ? ?You have been scheduled for an abdominal ultrasound at [facility] (1st floor of hospital) on Tuesday, 09-01-21 at 9:30am. Please arrive 15 minutes prior to your appointment for registration. Make certain not to have anything to eat or drink 6 hours prior to your appointment. Should you need to reschedule your appointment, please contact radiology at [phone]. This test typically takes about 30 minutes to perform. ? ?You will be due for labs in May. Our lab is located in the basement of our building, located at [address]. They are open Monday through Friday from 7:30 am to 5:00 pm. You do not need an appointment.  ? ?Thank you for entrusting me with your care and for choosing [employer], ?Dr. [place]ville Cellar ? ? ? ? ?

## 2021-07-01 ENCOUNTER — Other Ambulatory Visit: Payer: Self-pay

## 2021-07-01 ENCOUNTER — Telehealth: Payer: Self-pay

## 2021-07-01 DIAGNOSIS — I8510 Secondary esophageal varices without bleeding: Secondary | ICD-10-CM

## 2021-07-01 DIAGNOSIS — K7460 Unspecified cirrhosis of liver: Secondary | ICD-10-CM

## 2021-07-01 DIAGNOSIS — I8501 Esophageal varices with bleeding: Secondary | ICD-10-CM

## 2021-07-01 NOTE — Telephone Encounter (Signed)
Patient has been scheduled for an EGD with banding at [facility] on 07/16/21 at 7:30 am with Dr. Fuller Plan. Pt will need to arrive at [facility] by 6:00 am with a care partner. Instructions have been sent to patient via my chart and mailed.  ? ?Ambulatory referral to GI in epic. ? ?Lm on vm for patient's wife to return call to discuss. ?

## 2021-07-01 NOTE — Telephone Encounter (Signed)
Received a vm from patient's wife asking that I give her a call back. I called and spoke with patient's wife. She is aware that pt has been scheduled. She knows that his instructions are available in MyChart and I have mailed a copy as well. Pt's wife verbalized understanding and had no concerns at the end of the call. ?

## 2021-07-01 NOTE — Telephone Encounter (Signed)
-----   Message from Roetta Sessions, [place] sent at 07/01/2021  9:20 AM EDT ----- ?Regarding: FW: hospital case question ?I called wife and they are available for EGD with Fuller Plan al [facility] 4-20. I'll work on that as I can ? ? ?----- Message ----- ?From: Yetta Flock, MD ?Sent: 06/30/2021   5:44 PM EDT ?To: Ladene Artist, MD, Lindon Romp, CMA, # ?Subject: RE: hospital case question                    ? ?That's great, thanks so much. ? ?Jan or [place] please see below. Can you see if Mr. Streett is available to do EGD with Dr. Fuller Plan in his April opening? He is stable to proceed to have it done at this time. Thanks ? ? ?----- Message ----- ?From: Ladene Artist, MD ?Sent: 06/30/2021   3:20 PM EDT ?To: Lindon Romp, CMA, Yetta Flock, MD ?Subject: RE: hospital case question                    ? ?Hi, If I have an opening I'll add on your EGD/banding. MS  ? ? ?----- Message ----- ?From: Yetta Flock, MD ?Sent: 06/30/2021  11:57 AM EDT ?To: Ladene Artist, MD ?Subject: hospital case question                        ? ?Pecolia Ades, ?I was in clinic today with Estill Bamberg, she mentioned you may have an opening for a hospital case that you may not fill in the near future? If so, I have a patient who is in need of surveillance EGD with banding of varices if you have room. If you need the slot for your own patients I completely understand, just trying to look for options for my patient as I am booked out through June. Again no worries if you can't do it, thanks. ? ?Richardson Landry ? ? ? ? ?

## 2021-07-09 ENCOUNTER — Encounter (HOSPITAL_COMMUNITY): Payer: Self-pay | Admitting: Gastroenterology

## 2021-07-15 NOTE — Anesthesia Preprocedure Evaluation (Addendum)
Anesthesia Evaluation  ?Patient identified by MRN, date of birth, ID band ?Patient awake ? ? ? ?Reviewed: ?Allergy & Precautions, NPO status , Patient's Chart, lab work & pertinent test results ? ?History of Anesthesia Complications ?Negative for: history of anesthetic complications ? ?Airway ?Mallampati: II ? ?TM Distance: >3 FB ?Neck ROM: Full ? ? ? Dental ? ?(+) Missing,  ?  ?Pulmonary ?asthma , sleep apnea , COPD,  COPD inhaler,  ?  ?Pulmonary exam normal ? ? ? ? ? ? ? Cardiovascular ?hypertension, Pt. on medications ?Normal cardiovascular exam ? ?TTE 05/01/20: EF 60-65%, mild RVE, mild AR, mild dilatation of ascending aorta measuring 39 mm ?  ?Neuro/Psych ?Anxiety Depression CVA   ? GI/Hepatic ?GERD  Medicated and Controlled,(+) Cirrhosis  ? Esophageal Varices ?  ? ,   ?Endo/Other  ?diabetes, Type 2, Insulin DependentHypothyroidism  ? Renal/GU ?Renal InsufficiencyRenal disease (solitary kidney)  ?negative genitourinary ?  ?Musculoskeletal ? ?(+) Arthritis ,  ? Abdominal ?  ?Peds ? Hematology ? ?(+) Blood dyscrasia, anemia ,   ?Anesthesia Other Findings ?Day of surgery medications reviewed with patient. ? Reproductive/Obstetrics ?negative OB ROS ? ?  ? ? ? ? ? ? ? ? ? ? ? ? ? ?  ?  ? ? ? ? ? ? ? ?Anesthesia Physical ?Anesthesia Plan ? ?ASA: 4 ? ?Anesthesia Plan: MAC  ? ?Post-op Pain Management: Minimal or no pain anticipated  ? ?Induction:  ? ?PONV Risk Score and Plan: Treatment may vary due to age or medical condition and Propofol infusion ? ?Airway Management Planned: Natural Airway and Nasal Cannula ? ?Additional Equipment: None ? ?Intra-op Plan:  ? ?Post-operative Plan:  ? ?Informed Consent: I have reviewed the patients History and Physical, chart, labs and discussed the procedure including the risks, benefits and alternatives for the proposed anesthesia with the patient or authorized representative who has indicated his/her understanding and acceptance.  ? ? ? ? ? ?Plan  Discussed with: CRNA ? ?Anesthesia Plan Comments:   ? ? ? ? ? ?Anesthesia Quick Evaluation ? ?

## 2021-07-16 ENCOUNTER — Ambulatory Visit (HOSPITAL_COMMUNITY)
Admission: RE | Admit: 2021-07-16 | Discharge: 2021-07-16 | Disposition: A | Discharge status: Home / Self Care | Payer: No Typology Code available for payment source | Attending: Gastroenterology | Admitting: Gastroenterology

## 2021-07-16 ENCOUNTER — Ambulatory Visit (HOSPITAL_COMMUNITY): Payer: No Typology Code available for payment source | Admitting: Anesthesiology

## 2021-07-16 ENCOUNTER — Encounter (HOSPITAL_COMMUNITY): Payer: Self-pay | Admitting: Gastroenterology

## 2021-07-16 ENCOUNTER — Other Ambulatory Visit: Payer: Self-pay

## 2021-07-16 ENCOUNTER — Ambulatory Visit (HOSPITAL_BASED_OUTPATIENT_CLINIC_OR_DEPARTMENT_OTHER): Payer: No Typology Code available for payment source | Admitting: Anesthesiology

## 2021-07-16 ENCOUNTER — Encounter (HOSPITAL_COMMUNITY)
Admission: RE | Disposition: A | Discharge status: Home / Self Care | Payer: Self-pay | Source: Home / Self Care | Attending: Gastroenterology

## 2021-07-16 DIAGNOSIS — K219 Gastro-esophageal reflux disease without esophagitis: Secondary | ICD-10-CM | POA: Insufficient documentation

## 2021-07-16 DIAGNOSIS — Z79899 Other long term (current) drug therapy: Secondary | ICD-10-CM | POA: Diagnosis not present

## 2021-07-16 DIAGNOSIS — E1122 Type 2 diabetes mellitus with diabetic chronic kidney disease: Secondary | ICD-10-CM | POA: Diagnosis not present

## 2021-07-16 DIAGNOSIS — G20 Parkinson's disease: Secondary | ICD-10-CM | POA: Insufficient documentation

## 2021-07-16 DIAGNOSIS — K3189 Other diseases of stomach and duodenum: Secondary | ICD-10-CM | POA: Insufficient documentation

## 2021-07-16 DIAGNOSIS — I8510 Secondary esophageal varices without bleeding: Secondary | ICD-10-CM | POA: Diagnosis present

## 2021-07-16 DIAGNOSIS — I864 Gastric varices: Secondary | ICD-10-CM | POA: Insufficient documentation

## 2021-07-16 DIAGNOSIS — Z8673 Personal history of transient ischemic attack (TIA), and cerebral infarction without residual deficits: Secondary | ICD-10-CM | POA: Diagnosis not present

## 2021-07-16 DIAGNOSIS — I129 Hypertensive chronic kidney disease with stage 1 through stage 4 chronic kidney disease, or unspecified chronic kidney disease: Secondary | ICD-10-CM | POA: Diagnosis not present

## 2021-07-16 DIAGNOSIS — N183 Chronic kidney disease, stage 3 unspecified: Secondary | ICD-10-CM | POA: Insufficient documentation

## 2021-07-16 DIAGNOSIS — I8501 Esophageal varices with bleeding: Secondary | ICD-10-CM

## 2021-07-16 DIAGNOSIS — Z794 Long term (current) use of insulin: Secondary | ICD-10-CM | POA: Insufficient documentation

## 2021-07-16 DIAGNOSIS — K766 Portal hypertension: Secondary | ICD-10-CM | POA: Diagnosis not present

## 2021-07-16 DIAGNOSIS — R296 Repeated falls: Secondary | ICD-10-CM | POA: Diagnosis not present

## 2021-07-16 DIAGNOSIS — K7469 Other cirrhosis of liver: Secondary | ICD-10-CM | POA: Diagnosis not present

## 2021-07-16 DIAGNOSIS — I10 Essential (primary) hypertension: Secondary | ICD-10-CM | POA: Diagnosis not present

## 2021-07-16 DIAGNOSIS — K7460 Unspecified cirrhosis of liver: Secondary | ICD-10-CM

## 2021-07-16 HISTORY — PX: ESOPHAGOGASTRODUODENOSCOPY (EGD) WITH PROPOFOL: SHX5813

## 2021-07-16 HISTORY — PX: ESOPHAGEAL BANDING: SHX5518

## 2021-07-16 LAB — GLUCOSE, CAPILLARY: Glucose-Capillary: 141 mg/dL — ABNORMAL HIGH (ref 70–99)

## 2021-07-16 SURGERY — ESOPHAGOGASTRODUODENOSCOPY (EGD) WITH PROPOFOL
Anesthesia: Monitor Anesthesia Care

## 2021-07-16 MED ORDER — PHENYLEPHRINE HCL (PRESSORS) 10 MG/ML IV SOLN
INTRAVENOUS | Status: AC
Start: 2021-07-16 — End: ?
  Filled 2021-07-16: qty 1

## 2021-07-16 MED ORDER — PROPOFOL 1000 MG/100ML IV EMUL
INTRAVENOUS | Status: AC
  Filled 2021-07-16: qty 100

## 2021-07-16 MED ORDER — ONDANSETRON HCL 4 MG/2ML IJ SOLN
INTRAMUSCULAR | Status: DC | PRN
  Administered 2021-07-16: 4 mg via INTRAVENOUS

## 2021-07-16 MED ORDER — PHENYLEPHRINE HCL (PRESSORS) 10 MG/ML IV SOLN
INTRAVENOUS | Status: AC
  Filled 2021-07-16: qty 1

## 2021-07-16 MED ORDER — LACTATED RINGERS IV SOLN: INTRAVENOUS | Status: DC | PRN

## 2021-07-16 MED ORDER — LIDOCAINE HCL (CARDIAC) PF 100 MG/5ML IV SOSY
PREFILLED_SYRINGE | INTRAVENOUS | Status: DC | PRN
  Administered 2021-07-16: 100 mg via INTRAVENOUS

## 2021-07-16 MED ORDER — PHENYLEPHRINE HCL (PRESSORS) 10 MG/ML IV SOLN
INTRAVENOUS | Status: DC | PRN
  Administered 2021-07-16 (×3): 120 ug via INTRAVENOUS

## 2021-07-16 MED ORDER — PROPOFOL 500 MG/50ML IV EMUL
INTRAVENOUS | Status: DC | PRN
  Administered 2021-07-16: 300 ug/kg/min via INTRAVENOUS

## 2021-07-16 MED ORDER — SODIUM CHLORIDE 0.9 % IV SOLN
INTRAVENOUS | Status: DC
  Administered 2021-07-16: 1000 mL via INTRAVENOUS

## 2021-07-16 SURGICAL SUPPLY — 15 items

## 2021-07-16 NOTE — Interval H&P Note (Signed)
History and Physical Interval Note: ? ?07/16/2021 ?7:38 AM ? ?Corey Wood  has presented today for surgery, with the diagnosis of cirrhosis with esophageal varices.  The various methods of treatment have been discussed with the patient and family. After consideration of risks, benefits and other options for treatment, the patient has consented to  Procedure(s): ?ESOPHAGOGASTRODUODENOSCOPY (EGD) WITH PROPOFOL (N/A) ?ESOPHAGEAL BANDING (N/A) as a surgical intervention.  The patient's history has been reviewed, patient examined, no change in status, stable for surgery.  I have reviewed the patient's chart and labs.  Questions were answered to the patient's satisfaction.   ? ? ?Pricilla Riffle. Fuller Plan ? ? ?

## 2021-07-16 NOTE — Op Note (Signed)
[facility] ?Patient Name: Corey Wood ?Procedure Date: 07/16/2021 ?MRN: [id number] ?Attending MD: Ladene Artist , MD ?Date of Birth: 1950-05-09 ?CSN: [id number] ?Age: 71 ?Admit Type: Outpatient ?Procedure:                Upper GI endoscopy ?Indications:              1st degree variceal eradication (no prior bleeding) ?Providers:                Pricilla Riffle. Fuller Plan, MD, Elmer Ramp. Tilden Dome, RN, Charlean Merl  ?                          Purcell Nails, Technician, Enrigue Catena, CRNA ?Referring MD:             [facility] ?Medicines:                Monitored Anesthesia Care ?Complications:            No immediate complications. ?Estimated Blood Loss:     Estimated blood loss: none. ?Procedure:                Pre-Anesthesia Assessment: ?                          - Prior to the procedure, a History and Physical  ?                          was performed, and patient medications and  ?                          allergies were reviewed. The patient's tolerance of  ?                          previous anesthesia was also reviewed. The risks  ?                          and benefits of the procedure and the sedation  ?                          options and risks were discussed with the patient.  ?                          All questions were answered, and informed consent  ?                          was obtained. Prior Anticoagulants: The patient has  ?                          taken no previous anticoagulant or antiplatelet  ?                          agents. ASA Grade Assessment: IV - A patient with  ?                          severe systemic disease that is a constant threat  ?  to life. After reviewing the risks and benefits,  ?                          the patient was deemed in satisfactory condition to  ?                          undergo the procedure. ?                          After obtaining informed consent, the endoscope was  ?                          passed under direct vision.  Throughout the  ?                          procedure, the patient's blood pressure, pulse, and  ?                          oxygen saturations were monitored continuously. The  ?                          [id number]-[id number] ([id number]) Olympus endoscope was introduced  ?                          through the mouth, and advanced to the second part  ?                          of duodenum. The upper GI endoscopy was  ?                          accomplished without difficulty. The patient  ?                          tolerated the procedure well. ?Scope In: ?Scope Out: ?Findings: ?     One column of grade II varices with no bleeding and no stigmata of  ?     recent bleeding were found in the distal esophagus,. They were 6 mm in  ?     largest diameter. No red wale signs were present. Scarring from prior  ?     treatment was visible. The varices appeared smaller than they were at  ?     prior exam. Two bands were successfully placed with complete  ?     eradication, resulting in deflation of varices. There was no bleeding  ?     during and at the end of the procedure. ?     The exam of the esophagus was otherwise normal. ?     Moderate portal hypertensive gastropathy was found in the entire  ?     examined stomach. No gastric varices. ?     The exam of the stomach was otherwise normal. ?     The duodenal bulb and second portion of the duodenum were normal. ?Impression:               - Grade II esophageal varices with no bleeding and  ?  no stigmata of recent bleeding. Completely  ?                          eradicated. Banded. ?                          - Portal hypertensive gastropathy. ?                          - Normal duodenal bulb and second portion of the  ?                          duodenum. ?                          - No specimens collected. ?Moderate Sedation: ?     Not Applicable - Patient had care per Anesthesia. ?Recommendation:           - Patient has a contact number available for  ?                           emergencies. The signs and symptoms of potential  ?                          delayed complications were discussed with the  ?                          patient. Return to normal activities tomorrow.  ?                          Written discharge instructions were provided to the  ?                          patient. ?                          - Resume previous diet. ?                          - Continue present medications. ?                          - Consider repeat upper endoscopy per Dr.  [name] Kitchen                          [place]. ?                          - GI office appt with Dr. Havery Moros as planned. ?Procedure Code(s):        --- Professional --- ?                          [phone], Esophagogastroduodenoscopy, flexible,  ?                          transoral; with band ligation of esophageal/gastric  ?  varices ?Diagnosis Code(s):        --- Professional --- ?                          I85.00, Esophageal varices without bleeding ?                          K76.6, Portal hypertension ?                          K31.89, Other diseases of stomach and duodenum ?CPT copyright 2019 American Medical Association. All rights reserved. ?The codes documented in this report are preliminary and upon coder review may  ?be revised to meet current compliance requirements. ?Ladene Artist, MD ?07/16/2021 8:15:44 AM ?This report has been signed electronically. ?Number of Addenda: 0 ?

## 2021-07-16 NOTE — Anesthesia Procedure Notes (Signed)
Procedure Name: [place] ?Date/Time: 07/16/2021 7:42 AM ?Performed by: Lissa Morales, CRNA ?Pre-anesthesia Checklist: Patient identified, Emergency Drugs available, Suction available, Patient being monitored and Timeout performed ?Patient Re-evaluated:Patient Re-evaluated prior to induction ?Oxygen Delivery Method: Simple face mask ?Preoxygenation: Pre-oxygenation with 100% oxygen ?Placement Confirmation: positive ETCO2 ? ? ? ? ?

## 2021-07-16 NOTE — Anesthesia Postprocedure Evaluation (Signed)
Anesthesia Post Note ? ?Patient: Corey Wood ? ?Procedure(s) Performed: ESOPHAGOGASTRODUODENOSCOPY (EGD) WITH PROPOFOL ?ESOPHAGEAL BANDING ? ?  ? ?Patient location during evaluation: PACU ?Anesthesia Type: MAC ?Level of consciousness: awake and alert ?Pain management: pain level controlled ?Vital Signs Assessment: post-procedure vital signs reviewed and stable ?Respiratory status: spontaneous breathing, nonlabored ventilation and respiratory function stable ?Cardiovascular status: blood pressure returned to baseline ?Postop Assessment: no apparent nausea or vomiting ?Anesthetic complications: no ? ? ?No notable events documented. ? ?Last Vitals:  ?Vitals:  ? 07/16/21 0838 07/16/21 0840  ?BP:  102/67  ?Pulse: 69 72  ?Resp: (!) 24 13  ?Temp:    ?SpO2: (!) 87% 90%  ?  ?Last Pain:  ?Vitals:  ? 07/16/21 0808  ?TempSrc: Temporal  ?PainSc: 0-No pain  ? ? ?  ?  ?  ?  ?  ?  ? ?Marthenia Rolling ? ? ? ? ?

## 2021-07-16 NOTE — Transfer of Care (Signed)
Immediate Anesthesia Transfer of Care Note ? ?Patient: Corey Wood ? ?Procedure(s) Performed: ESOPHAGOGASTRODUODENOSCOPY (EGD) WITH PROPOFOL ?ESOPHAGEAL BANDING ? ?Patient Location: PACU ? ?Anesthesia Type:MAC ? ?Level of Consciousness: awake, drowsy and patient cooperative ? ?Airway & Oxygen Therapy: Patient Spontanous Breathing and Patient connected to face mask oxygen ? ?Post-op Assessment: Report given to RN, Post -op Vital signs reviewed and stable and Patient moving all extremities X 4 ? ?Post vital signs: stable ? ?Last Vitals:  ?Vitals Value Taken Time  ?BP 120/66 07/16/21 0810  ?Temp    ?Pulse 76 07/16/21 0813  ?Resp 26 07/16/21 0813  ?SpO2 100 % 07/16/21 0813  ?Vitals shown include unvalidated device data. ? ?Last Pain:  ?Vitals:  ? 07/16/21 0808  ?TempSrc: Temporal  ?PainSc: 0-No pain  ?   ? ?  ? ?Complications: No notable events documented. ?

## 2021-08-04 ENCOUNTER — Other Ambulatory Visit: Payer: Self-pay

## 2021-08-04 ENCOUNTER — Telehealth: Payer: Self-pay

## 2021-08-04 DIAGNOSIS — K7460 Unspecified cirrhosis of liver: Secondary | ICD-10-CM

## 2021-08-04 DIAGNOSIS — I8510 Secondary esophageal varices without bleeding: Secondary | ICD-10-CM

## 2021-08-04 NOTE — Progress Notes (Signed)
EGD with banding of varices on Thursday, 10-15-21  ?

## 2021-08-04 NOTE — Telephone Encounter (Signed)
Called and spoke to Patient's wife Ivin Booty. They are available to have his EGD at [facility] at 7:30am on 10-15-21 .  Mailing instructions and sending to MyChart ?

## 2021-08-16 DIAGNOSIS — W19XXXA Unspecified fall, initial encounter: Secondary | ICD-10-CM | POA: Diagnosis not present

## 2021-08-16 DIAGNOSIS — R58 Hemorrhage, not elsewhere classified: Secondary | ICD-10-CM | POA: Diagnosis not present

## 2021-08-16 DIAGNOSIS — R0902 Hypoxemia: Secondary | ICD-10-CM | POA: Diagnosis not present

## 2021-09-01 ENCOUNTER — Ambulatory Visit (HOSPITAL_COMMUNITY)
Admission: RE | Admit: 2021-09-01 | Discharge: 2021-09-01 | Disposition: A | Discharge status: Home / Self Care | Payer: No Typology Code available for payment source | Source: Ambulatory Visit | Attending: Gastroenterology | Admitting: Gastroenterology

## 2021-09-01 DIAGNOSIS — K7460 Unspecified cirrhosis of liver: Secondary | ICD-10-CM | POA: Insufficient documentation

## 2021-09-01 DIAGNOSIS — I8510 Secondary esophageal varices without bleeding: Secondary | ICD-10-CM | POA: Insufficient documentation

## 2021-09-02 ENCOUNTER — Telehealth: Payer: Self-pay

## 2021-09-02 DIAGNOSIS — K7460 Unspecified cirrhosis of liver: Secondary | ICD-10-CM

## 2021-09-02 NOTE — Telephone Encounter (Signed)
-----   Message from Yetta Flock, MD sent at 09/02/2021  8:05 AM EDT ----- Herbert Seta can you relay to this patient that he has stable changes of cirrhosis of the liver without liver mass which is good, repeat ultrasound in 6 months. He incidentally has a pleural effusion on the right side.  He has had this in the past.  I think he has been evaluated by the [facility] for supplemental oxygen use.  Can you let the patient's wife know who manages his medical affairs, I am assuming he has had this evaluated at the [place] but they need to be aware of this and do a follow-up chest x-ray PA LAT if he has not had one recently there.  We can get it done here but I believe his primary team has been managing this.  Can you let me know?  Thanks

## 2021-09-02 NOTE — Telephone Encounter (Signed)
Called and spoke with patient's wife regarding [place] results and recommendations. Corey Wood is aware that we will repeat routine [place] in 6 months. We discussed incidental finding of right pleural effusion. Corey Wood reports that patient is followed by Dr. Hilton Cork in [facility] at [facility]. I told Corey Wood that I will fax [place] report and recommendations to Dr. Hilton Cork. (P: [phone], F: [phone]).  Corey Wood also states that patient's VA auth will expire right before his 09/27/21 procedure at [facility]. I told Corey Wood that I will initiate a new VA request today. Request for service VA form has been completed and faxed to the [place] at [phone].  63-monthRUQ UKoreareminder in epic.

## 2021-09-07 NOTE — Telephone Encounter (Signed)
Patient's VA case worker is [place] [phone]. I attempted to reach [place], but she is not currently in the office, her vm advised [place] to call the community care center at [phone], ext. [phone].   Called the community care center, waited over 20 minutes. I was told to call Hansel Feinstein, coordinator at [phone] to follow up on [place] request. I was told that the request go directly to the nurses. I called Apolonio Schneiders and left her a detailed vm. I provided her with the patient's information. I asked that she give me a call back. I provided Apolonio Schneiders with my direct office #.

## 2021-09-09 NOTE — Telephone Encounter (Signed)
Patients wife called wanting to follow up on [place]. Please advise.

## 2021-09-11 NOTE — Telephone Encounter (Signed)
Left message for wife to cal back:

## 2021-09-15 NOTE — Telephone Encounter (Signed)
Received a vm from [place], she stated that the provider request forms are put in a clinical folder that is only access by permitted clinical personnel. Apolonio Schneiders stated that she was not able to pull up the pt's chart without the last 4 of his SSN. Apolonio Schneiders stated that the minimum processing time for requests are 2 weeks and they are short staffed. She stated that the only way they would be able to pull up the request is when it has been processed and faxed back to [place].   I called and spoke with the patient's wife. I informed pt's wife of the information that I was provided. Pt's wife reports that she has also called the [place] to follow up and states that they have to go to the [place] tomorrow for an appt and she will mention it again. I told her that I will keep an eye out to make sure that we have received the approval prior to his repeat EGD in July. Pt's wife verbalized understanding and had no concerns at the end of the call.

## 2021-09-24 NOTE — Telephone Encounter (Signed)
Called and spoke with Yvone Neu at the [place]. He states that patient's Corey Wood was just approved today and we should be receiving the fax soon. Yvone Neu provided me with the Auth # [id number].   I called patient's wife and informed her of the information that I received. Pt's wife reports that she reached out to Dr. Tracey Harries office earlier this week to set up a thoracentesis for the patient but she has not heard anything. Pt's wife reports that pt had a mild fever last night and is coughing a lot today. She reports that the [place] in [place] do not have anyone that is certified to perform a thoracentesis. I recommended that pt's wife take the patient to be seen today either in the ED. Pt's wife verbalized understanding and had no concerns at the end of the call.

## 2021-09-30 ENCOUNTER — Other Ambulatory Visit (HOSPITAL_COMMUNITY): Payer: Self-pay | Admitting: Nurse Practitioner

## 2021-09-30 ENCOUNTER — Other Ambulatory Visit: Payer: Self-pay | Admitting: Nurse Practitioner

## 2021-09-30 DIAGNOSIS — J948 Other specified pleural conditions: Secondary | ICD-10-CM | POA: Diagnosis not present

## 2021-09-30 DIAGNOSIS — I8510 Secondary esophageal varices without bleeding: Secondary | ICD-10-CM | POA: Diagnosis not present

## 2021-09-30 DIAGNOSIS — K7469 Other cirrhosis of liver: Secondary | ICD-10-CM | POA: Diagnosis not present

## 2021-10-05 ENCOUNTER — Ambulatory Visit (HOSPITAL_COMMUNITY)
Admission: RE | Admit: 2021-10-05 | Discharge: 2021-10-05 | Disposition: A | Discharge status: Home / Self Care | Payer: No Typology Code available for payment source | Source: Ambulatory Visit | Attending: Nurse Practitioner | Admitting: Nurse Practitioner

## 2021-10-05 ENCOUNTER — Other Ambulatory Visit (HOSPITAL_COMMUNITY): Payer: Self-pay | Admitting: Student

## 2021-10-05 ENCOUNTER — Ambulatory Visit (HOSPITAL_COMMUNITY)
Admission: RE | Admit: 2021-10-05 | Discharge: 2021-10-05 | Disposition: A | Discharge status: Home / Self Care | Payer: No Typology Code available for payment source | Source: Ambulatory Visit | Attending: Student | Admitting: Student

## 2021-10-05 DIAGNOSIS — J984 Other disorders of lung: Secondary | ICD-10-CM | POA: Diagnosis not present

## 2021-10-05 DIAGNOSIS — J948 Other specified pleural conditions: Secondary | ICD-10-CM

## 2021-10-05 DIAGNOSIS — J90 Pleural effusion, not elsewhere classified: Secondary | ICD-10-CM | POA: Diagnosis not present

## 2021-10-05 HISTORY — PX: IR THORACENTESIS ASP PLEURAL SPACE W/IMG GUIDE: IMG5380

## 2021-10-05 MED ORDER — LIDOCAINE HCL 1 % IJ SOLN
INTRAMUSCULAR | Status: AC
  Filled 2021-10-05: qty 20

## 2021-10-05 MED ORDER — LIDOCAINE HCL 1 % IJ SOLN
INTRAMUSCULAR | Status: DC | PRN
  Administered 2021-10-05: 10 mL

## 2021-10-05 NOTE — Procedures (Signed)
PROCEDURE SUMMARY:  Successful image-guided right thoracentesis. Yielded 500 mL of bloody fluid. Pt tolerated procedure well. No immediate complications. EBL = trace   Specimen was not sent for labs. CXR ordered.  Please see imaging section of Epic for full dictation.  Armando Gang Damone Fancher PA-C 10/05/2021 9:06 AM

## 2021-10-08 ENCOUNTER — Telehealth: Payer: Self-pay | Admitting: Gastroenterology

## 2021-10-08 ENCOUNTER — Encounter (HOSPITAL_COMMUNITY): Payer: Self-pay | Admitting: Gastroenterology

## 2021-10-08 NOTE — Telephone Encounter (Signed)
Returned call to patient's wife. She reports that patient has been having a non-productive cough since his thoracentesis on 10/05/21. Pt's wife states that she has only been giving him the diabetic tussin at night time. I told her that she can give this to him every 4 hours PRN , but he is not to drive after taking it. Ivin Booty has been advised that pt should be sleeping elevated as well. Ivin Booty has been advised that if pt develops a more productive cough she needs to contact [name], NP as she may need to adjust patient's diuretics. Ivin Booty reports that she just felt more comfortable calling us since she is more familiar with [place] and just wanted to make [place] aware of what was going on with the patient. I told Mrs. Ivin Booty that Dr. Havery Moros is out of the office this week, but I will send him a message as an fyi. Ivin Booty verbalized understanding and had no concerns at the end of the call.

## 2021-10-12 NOTE — Telephone Encounter (Signed)
Called and spoke with Corey Wood this morning regarding Dr. Doyne Keel recommendations. Pt has been scheduled for a follow up appt with Dr. Havery Moros on Wednesday, 10/21/21 at 52 am. Corey Wood is aware that we will proceed with EGD as scheduled for 10/15/21. Corey Wood verbalized understanding and had no concerns at the end of the call.

## 2021-10-12 NOTE — Telephone Encounter (Signed)
Difficult situation. I spoke with Corey Wood on Friday about his case. He has some fluid in his lungs related to his liver disease, she thinks he is a TIPS candidate to treat this and his varices, but patient and wife were hesitant. I think his cough is probably from fluid in his lungs which he has had for a while now. He has CKD so can be limited in how aggressively we treat with diuretics. I think would be good to see me in the office to reassess and discuss possible TIPS with them. Hopefully respiratory status is stable, he has not needed oxygen in the past for this. He is scheduled with me for an EGD in the near future as well at the hospital for reassessment of his varices. Hopefully I can see him in the office in the next 1-2 weeks if something is open. Thanks

## 2021-10-15 ENCOUNTER — Ambulatory Visit (HOSPITAL_COMMUNITY): Payer: No Typology Code available for payment source | Admitting: Certified Registered Nurse Anesthetist

## 2021-10-15 ENCOUNTER — Other Ambulatory Visit: Payer: Self-pay

## 2021-10-15 ENCOUNTER — Encounter (HOSPITAL_COMMUNITY)
Admission: RE | Disposition: A | Discharge status: Home / Self Care | Payer: Self-pay | Source: Home / Self Care | Attending: Gastroenterology

## 2021-10-15 ENCOUNTER — Ambulatory Visit (HOSPITAL_COMMUNITY)
Admission: RE | Admit: 2021-10-15 | Discharge: 2021-10-15 | Disposition: A | Discharge status: Home / Self Care | Payer: No Typology Code available for payment source | Attending: Gastroenterology | Admitting: Gastroenterology

## 2021-10-15 ENCOUNTER — Ambulatory Visit (HOSPITAL_BASED_OUTPATIENT_CLINIC_OR_DEPARTMENT_OTHER): Payer: No Typology Code available for payment source | Admitting: Certified Registered Nurse Anesthetist

## 2021-10-15 ENCOUNTER — Telehealth: Payer: Self-pay

## 2021-10-15 ENCOUNTER — Encounter (HOSPITAL_COMMUNITY): Payer: Self-pay | Admitting: Gastroenterology

## 2021-10-15 DIAGNOSIS — Z8673 Personal history of transient ischemic attack (TIA), and cerebral infarction without residual deficits: Secondary | ICD-10-CM | POA: Diagnosis not present

## 2021-10-15 DIAGNOSIS — J449 Chronic obstructive pulmonary disease, unspecified: Secondary | ICD-10-CM | POA: Insufficient documentation

## 2021-10-15 DIAGNOSIS — Z09 Encounter for follow-up examination after completed treatment for conditions other than malignant neoplasm: Secondary | ICD-10-CM | POA: Insufficient documentation

## 2021-10-15 DIAGNOSIS — K219 Gastro-esophageal reflux disease without esophagitis: Secondary | ICD-10-CM | POA: Diagnosis not present

## 2021-10-15 DIAGNOSIS — K3189 Other diseases of stomach and duodenum: Secondary | ICD-10-CM | POA: Insufficient documentation

## 2021-10-15 DIAGNOSIS — G4730 Sleep apnea, unspecified: Secondary | ICD-10-CM | POA: Diagnosis not present

## 2021-10-15 DIAGNOSIS — K766 Portal hypertension: Secondary | ICD-10-CM | POA: Insufficient documentation

## 2021-10-15 DIAGNOSIS — I129 Hypertensive chronic kidney disease with stage 1 through stage 4 chronic kidney disease, or unspecified chronic kidney disease: Secondary | ICD-10-CM | POA: Diagnosis not present

## 2021-10-15 DIAGNOSIS — E1122 Type 2 diabetes mellitus with diabetic chronic kidney disease: Secondary | ICD-10-CM | POA: Insufficient documentation

## 2021-10-15 DIAGNOSIS — I8510 Secondary esophageal varices without bleeding: Secondary | ICD-10-CM | POA: Insufficient documentation

## 2021-10-15 DIAGNOSIS — I10 Essential (primary) hypertension: Secondary | ICD-10-CM | POA: Diagnosis not present

## 2021-10-15 DIAGNOSIS — Z794 Long term (current) use of insulin: Secondary | ICD-10-CM | POA: Diagnosis not present

## 2021-10-15 DIAGNOSIS — N183 Chronic kidney disease, stage 3 unspecified: Secondary | ICD-10-CM | POA: Insufficient documentation

## 2021-10-15 HISTORY — PX: ESOPHAGOGASTRODUODENOSCOPY (EGD) WITH PROPOFOL: SHX5813

## 2021-10-15 LAB — GLUCOSE, CAPILLARY: Glucose-Capillary: 136 mg/dL — ABNORMAL HIGH (ref 70–99)

## 2021-10-15 SURGERY — ESOPHAGOGASTRODUODENOSCOPY (EGD) WITH PROPOFOL
Anesthesia: Monitor Anesthesia Care

## 2021-10-15 MED ORDER — PROPOFOL 10 MG/ML IV BOLUS
INTRAVENOUS | Status: DC | PRN
  Administered 2021-10-15: 20 mg via INTRAVENOUS

## 2021-10-15 MED ORDER — LACTATED RINGERS IV SOLN
INTRAVENOUS | Status: AC | PRN
  Administered 2021-10-15: 1000 mL via INTRAVENOUS

## 2021-10-15 MED ORDER — ONDANSETRON HCL 4 MG/2ML IJ SOLN
INTRAMUSCULAR | Status: DC | PRN
  Administered 2021-10-15: 4 mg via INTRAVENOUS

## 2021-10-15 MED ORDER — LIDOCAINE 2% (20 MG/ML) 5 ML SYRINGE
INTRAMUSCULAR | Status: DC | PRN
  Administered 2021-10-15: 60 mg via INTRAVENOUS

## 2021-10-15 MED ORDER — PROPOFOL 500 MG/50ML IV EMUL
INTRAVENOUS | Status: AC
  Filled 2021-10-15: qty 100

## 2021-10-15 MED ORDER — PROPOFOL 500 MG/50ML IV EMUL
INTRAVENOUS | Status: DC | PRN
  Administered 2021-10-15: 125 ug/kg/min via INTRAVENOUS

## 2021-10-15 MED ORDER — PHENYLEPHRINE 80 MCG/ML (10ML) SYRINGE FOR IV PUSH (FOR BLOOD PRESSURE SUPPORT)
PREFILLED_SYRINGE | INTRAVENOUS | Status: DC | PRN
  Administered 2021-10-15 (×2): 80 ug via INTRAVENOUS

## 2021-10-15 SURGICAL SUPPLY — 15 items

## 2021-10-15 NOTE — Anesthesia Postprocedure Evaluation (Signed)
Anesthesia Post Note  Patient: Corey Wood  Procedure(s) Performed: ESOPHAGOGASTRODUODENOSCOPY (EGD) WITH PROPOFOL     Patient location during evaluation: PACU Anesthesia Type: MAC Level of consciousness: awake and alert Pain management: pain level controlled Vital Signs Assessment: post-procedure vital signs reviewed and stable Respiratory status: spontaneous breathing, nonlabored ventilation, respiratory function stable and patient connected to nasal cannula oxygen Cardiovascular status: stable and blood pressure returned to baseline Postop Assessment: no apparent nausea or vomiting Anesthetic complications: no   No notable events documented.  Last Vitals:  Vitals:   10/15/21 0801 10/15/21 0810  BP: (!) 94/58 113/60  Pulse: 62 69  Resp: (!) 24 19  Temp:    SpO2: 100% 92%    Last Pain:  Vitals:   10/15/21 0810  TempSrc:   PainSc: 0-No pain                 [name]

## 2021-10-15 NOTE — Op Note (Signed)
[facility] Patient Name: Corey Wood Procedure Date: 10/15/2021 MRN: [id number] Attending MD: Corey Wood. Corey Wood , MD Date of Birth: 27-Apr-1950 CSN: [id number] Age: 71 Admit Type: Outpatient Procedure:                Upper GI endoscopy Indications:              Follow-up of esophageal varices - history of very                            large esophageal varices with bleeding in the past.                            Intolerant of beta blockade, numerous bands placed                            in the past (>30), last EGD in April. Providers:                Corey Wood. Corey Moros, MD, Corey Coco, RN, Corey Wood, Technician Referring MD:              Medicines:                Monitored Anesthesia Care Complications:            No immediate complications. Estimated blood loss:                            None. Estimated Blood Loss:     Estimated blood loss: none. Procedure:                Pre-Anesthesia Assessment:                           - Prior to the procedure, a History and Physical                            was performed, and patient medications and                            allergies were reviewed. The patient's tolerance of                            previous anesthesia was also reviewed. The risks                            and benefits of the procedure and the sedation                            options and risks were discussed with the patient.                            All questions were answered, and informed consent  was obtained. Prior Anticoagulants: The patient has                            taken no previous anticoagulant or antiplatelet                            agents. ASA Grade Assessment: III - A patient with                            severe systemic disease. After reviewing the risks                            and benefits, the patient was deemed in                             satisfactory condition to undergo the procedure.                           After obtaining informed consent, the endoscope was                            passed under direct vision. Throughout the                            procedure, the patient's blood pressure, pulse, and                            oxygen saturations were monitored continuously. The                            [id number]-H190 ([id number]) Olympus endoscope was introduced                            through the mouth, and advanced to the second part                            of duodenum. The upper GI endoscopy was                            accomplished without difficulty. The patient                            tolerated the procedure well. Scope In: Scope Out: Findings:      Esophagogastric landmarks were identified: the Z-line was found at 40       cm, the gastroesophageal junction was found at 40 cm and the upper       extent of the gastric folds was found at 40 cm from the incisors.      Grade I varices were found in the lower third of the esophagus. They       were small in size and flattened with insufflation. No high risk       stigmata - significantly improved from prior. Extensive scarring noted       in the lower to mid esophagus, with some redundant scar  tissue noted in       the distal esophagus. No banding performed today.      The exam of the esophagus was otherwise normal.      Moderate portal hypertensive gastropathy was found in the entire       examined stomach.      The exam of the stomach was otherwise normal. No gastric varices. Some       small amound of residual food was noted in the proximal stomach.      The examined duodenum was normal. Impression:               - Esophagogastric landmarks identified.                           - Grade I esophageal varices with flattening -                            extensive scarring from prior banding, improved. No                            further banding performed  today                           - Portal hypertensive gastropathy.                           - Normal duodenal bulb and second portion of the                            duodenum. Moderate Sedation:      No moderate sedation, case performed with MAC Recommendation:           - Patient has a contact number available for                            emergencies. The signs and symptoms of potential                            delayed complications were discussed with the                            patient. Return to normal activities tomorrow.                            Written discharge instructions were provided to the                            patient.                           - Resume previous diet.                           - Continue present medications.                           - Repeat upper endoscopy in 6 months or so for  surveillance and further banding as needed. If that                            exam does not require banding will further extend                            surveillance interval. Otherwise, patient is also a                            candidate for TIPS given hepatic hydrothorax, if                            this is pursued would not need further endoscopy.                            Will discuss options with the patient Procedure Code(s):        --- Professional ---                           [phone], Esophagogastroduodenoscopy, flexible,                            transoral; diagnostic, including collection of                            specimen(s) by brushing or washing, when performed                            (separate procedure) Diagnosis Code(s):        --- Professional ---                           I85.00, Esophageal varices without bleeding                           K76.6, Portal hypertension                           K31.89, Other diseases of stomach and duodenum CPT copyright 2019 American Medical Association. All rights  reserved. The codes documented in this report are preliminary and upon coder review may  be revised to meet current compliance requirements. Corey Lipps P. Deerica Waszak, MD 10/15/2021 8:04:45 AM This report has been signed electronically. Number of Addenda: 0

## 2021-10-15 NOTE — Discharge Instructions (Signed)
YOU HAD AN ENDOSCOPIC PROCEDURE TODAY: Refer to the procedure report and other information in the discharge instructions given to you for any specific questions about what was found during the examination. If this information does not answer your questions, please call [place] office at [phone] to clarify.  ° °YOU SHOULD EXPECT: Some feelings of bloating in the abdomen. Passage of more gas than usual. Walking can help get rid of the air that was put into your GI tract during the procedure and reduce the bloating. If you had a lower endoscopy (such as a colonoscopy or flexible sigmoidoscopy) you may notice spotting of blood in your stool or on the toilet paper. Some abdominal soreness may be present for a day or two, also. ° °DIET: Your first meal following the procedure should be a light meal and then it is ok to progress to your normal diet. A half-sandwich or bowl of soup is an example of a good first meal. Heavy or fried foods are harder to digest and may make you feel nauseous or bloated. Drink plenty of fluids but you should avoid alcoholic beverages for 24 hours. If you had a esophageal dilation, please see attached instructions for diet.   ° °ACTIVITY: Your care partner should take you home directly after the procedure. You should plan to take it easy, moving slowly for the rest of the day. You can resume normal activity the day after the procedure however YOU SHOULD NOT DRIVE, use power tools, machinery or perform tasks that involve climbing or major physical exertion for 24 hours (because of the sedation medicines used during the test).  ° °SYMPTOMS TO REPORT IMMEDIATELY: °A gastroenterologist can be reached at any hour. Please call [phone]  for any of the following symptoms:  °Following lower endoscopy (colonoscopy, flexible sigmoidoscopy) °Excessive amounts of blood in the stool  °Significant tenderness, worsening of abdominal pains  °Swelling of the abdomen that is new, acute  °Fever of 100° or  higher  °Following upper endoscopy (EGD, EUS, ERCP, esophageal dilation) °Vomiting of blood or coffee ground material  °New, significant abdominal pain  °New, significant chest pain or pain under the shoulder blades  °Painful or persistently difficult swallowing  °New shortness of breath  °Black, tarry-looking or red, bloody stools ° °FOLLOW UP:  °If any biopsies were taken you will be contacted by phone or by letter within the next 1-3 weeks. Call [phone]  if you have not heard about the biopsies in 3 weeks.  °Please also call with any specific questions about appointments or follow up tests. ° °

## 2021-10-15 NOTE — Telephone Encounter (Signed)
-----   Message from Yetta Flock, MD sent at 10/15/2021  8:20 AM EDT ----- Regarding: outpatient follow up Jan can you help coordinate an office follow up for Mr. Grundman with me? Thanks

## 2021-10-15 NOTE — Transfer of Care (Signed)
Immediate Anesthesia Transfer of Care Note  Patient: Corey Wood  Procedure(s) Performed: ESOPHAGOGASTRODUODENOSCOPY (EGD) WITH PROPOFOL  Patient Location: PACU and Endoscopy Unit  Anesthesia Type:MAC  Level of Consciousness: awake, alert  and oriented  Airway & Oxygen Therapy: Patient Spontanous Breathing and Patient connected to face mask oxygen  Post-op Assessment: Report given to RN and Post -op Vital signs reviewed and stable  Post vital signs: Reviewed and stable  Last Vitals:  Vitals Value Taken Time  BP 98/56 10/15/21 0750  Temp 36.6 C 10/15/21 0750  Pulse 68 10/15/21 0750  Resp 27 10/15/21 0752  SpO2 100 % 10/15/21 0750  Vitals shown include unvalidated device data.  Last Pain:  Vitals:   10/15/21 0750  TempSrc: Temporal  PainSc: Asleep         Complications: No notable events documented.

## 2021-10-15 NOTE — H&P (Signed)
[facility] History and Physical   Primary Care Physician:  [facility]   Reason for Procedure:   Esophageal varices with bleeding in the past  Plan:    EGD with variceal ligation     HPI: Corey Wood is a 71 y.o. male  here for EGD to treat esophageal varices. See prior notes for full history. Large esophageal varices, bleeding in the past, has undergone numerous EGDs up to this point. Almost 30 bands placed to date. Last EGD in April with only 2 bands placed. He has history of pleural effusions thought to be related to his liver disease. Denies any shortness of breath today or chest pains, feeling at baseline without complaints.  I have discussed risks / benefits of endoscopy and anesthesia, he understands and wants to proceed, further recommendations pending the results.   Past Medical History:  Diagnosis Date   Allergy    seasonal allergies   Anxiety    on meds   Aortic valve disorder    Arthritis    generalized (fingers)(shoulders)   Asthma    uses inhaler   Cataract    bilateral -sx    Cirrhosis (HCC)    CKD (chronic kidney disease), stage III (HCC)    only has one kidney   COPD (chronic obstructive pulmonary disease) (HCC)    Depression    on meds   DM (diabetes mellitus) (Corey Wood)    on meds   Dyspnea    GERD (gastroesophageal reflux disease)    on meds   Heart murmur    History of colon polyps    History of COVID-19 10/14/2020   History of kidney stones    Hx of acute pancreatitis 10/2019   Hyperlipidemia    on meds   Hypertension    on meds   Hypothyroidism    not on meds at this time   Myelodysplastic syndrome ([name])    Neuromuscular disorder (Corey Wood)    per pt   Obstructive sleep apnea    Oxygen deficiency    Parkinsonism (Corey Wood)    per pt report   Peripheral edema    bilateral legs   Peripheral positional vertigo    Sleep apnea    No CPAP   Stroke [facility])     Past Surgical History:  Procedure Laterality Date   ANKLE  SURGERY     CHOLECYSTECTOMY     ENDARTERECTOMY Left 04/29/2020   Procedure: CAROTID EXPLORATION removal of  central venous catheter;  Surgeon: Rosetta Posner, MD;  Location: [facility] OR;  Service: Vascular;  Laterality: Left;   ESOPHAGEAL BANDING N/A 04/17/2020   Procedure: ESOPHAGEAL BANDING;  Surgeon: Yetta Flock, MD;  Location: [facility];  Service: Gastroenterology;  Laterality: N/A;   ESOPHAGEAL BANDING  04/29/2020   Procedure: ESOPHAGEAL BANDING;  Surgeon: Rush Landmark Telford Nab., MD;  Location: [place];  Service: Gastroenterology;;   ESOPHAGEAL BANDING N/A 06/10/2020   Procedure: ESOPHAGEAL BANDING;  Surgeon: Yetta Flock, MD;  Location: [facility];  Service: Gastroenterology;  Laterality: N/A;   ESOPHAGEAL BANDING N/A 11/10/2020   Procedure: ESOPHAGEAL BANDING;  Surgeon: Yetta Flock, MD;  Location: [facility];  Service: Gastroenterology;  Laterality: N/A;   ESOPHAGEAL BANDING N/A 02/10/2021   Procedure: ESOPHAGEAL BANDING;  Surgeon: Jerene Bears, MD;  Location: [facility];  Service: Gastroenterology;  Laterality: N/A;   ESOPHAGEAL BANDING N/A 04/21/2021   Procedure: ESOPHAGEAL BANDING;  Surgeon: Milus Banister, MD;  Location: [facility];  Service: Endoscopy;  Laterality: N/A;   ESOPHAGEAL BANDING N/A 07/16/2021   Procedure: ESOPHAGEAL BANDING;  Surgeon: Ladene Artist, MD;  Location: [facility];  Service: Gastroenterology;  Laterality: N/A;   ESOPHAGOGASTRODUODENOSCOPY N/A 04/25/2020   Procedure: ESOPHAGOGASTRODUODENOSCOPY (EGD);  Surgeon: Carol Ada, MD;  Location: [facility];  Service: Endoscopy;  Laterality: N/A;   ESOPHAGOGASTRODUODENOSCOPY (EGD) WITH PROPOFOL N/A 04/17/2020   Procedure: ESOPHAGOGASTRODUODENOSCOPY (EGD) WITH PROPOFOL;  Surgeon: Yetta Flock, MD;  Location: [facility];  Service: Gastroenterology;  Laterality: N/A;   ESOPHAGOGASTRODUODENOSCOPY (EGD) WITH PROPOFOL N/A 04/29/2020   Procedure: ESOPHAGOGASTRODUODENOSCOPY (EGD)  WITH PROPOFOL;  Surgeon: Rush Landmark Telford Nab., MD;  Location: [place];  Service: Gastroenterology;  Laterality: N/A;   ESOPHAGOGASTRODUODENOSCOPY (EGD) WITH PROPOFOL N/A 06/10/2020   Procedure: ESOPHAGOGASTRODUODENOSCOPY (EGD) WITH PROPOFOL;  Surgeon: Yetta Flock, MD;  Location: [facility];  Service: Gastroenterology;  Laterality: N/A;   ESOPHAGOGASTRODUODENOSCOPY (EGD) WITH PROPOFOL N/A 11/10/2020   Procedure: ESOPHAGOGASTRODUODENOSCOPY (EGD) WITH PROPOFOL;  Surgeon: Yetta Flock, MD;  Location: [facility];  Service: Gastroenterology;  Laterality: N/A;   ESOPHAGOGASTRODUODENOSCOPY (EGD) WITH PROPOFOL N/A 02/10/2021   Procedure: ESOPHAGOGASTRODUODENOSCOPY (EGD) WITH PROPOFOL;  Surgeon: Jerene Bears, MD;  Location: [facility];  Service: Gastroenterology;  Laterality: N/A;   ESOPHAGOGASTRODUODENOSCOPY (EGD) WITH PROPOFOL N/A 03/31/2021   Procedure: ESOPHAGOGASTRODUODENOSCOPY (EGD) WITH PROPOFOL;  Surgeon: Yetta Flock, MD;  Location: [facility];  Service: Gastroenterology;  Laterality: N/A;   ESOPHAGOGASTRODUODENOSCOPY (EGD) WITH PROPOFOL N/A 04/21/2021   Procedure: ESOPHAGOGASTRODUODENOSCOPY (EGD) WITH PROPOFOL;  Surgeon: Milus Banister, MD;  Location: [facility];  Service: Endoscopy;  Laterality: N/A;   ESOPHAGOGASTRODUODENOSCOPY (EGD) WITH PROPOFOL N/A 07/16/2021   Procedure: ESOPHAGOGASTRODUODENOSCOPY (EGD) WITH PROPOFOL;  Surgeon: Ladene Artist, MD;  Location: [facility];  Service: Gastroenterology;  Laterality: N/A;   EYE SURGERY     HERNIA REPAIR     IR PARACENTESIS  09/18/2020   IR THORACENTESIS ASP PLEURAL SPACE W/IMG GUIDE  10/05/2021   KIDNEY STONE SURGERY     WISDOM TOOTH EXTRACTION      Prior to Admission medications   Medication Sig Start Date End Date Taking? Authorizing Provider  acetaminophen (TYLENOL) 325 MG tablet Take 325 mg by mouth at bedtime.   Yes [provider]  albuterol (VENTOLIN HFA) 108 (90 Base) MCG/ACT inhaler  Inhale 1-2 puffs into the lungs every 6 (six) hours as needed for wheezing or shortness of breath.   Yes [provider]  ARTIFICIAL SALIVA MT Use as directed 1 spray in the mouth or throat as needed (Dry mouth). MOUTH COAT 10/26/20  Yes [provider]  atorvastatin (LIPITOR) 40 MG tablet Take 40 mg by mouth daily.   Yes [provider]  buPROPion (WELLBUTRIN XL) 150 MG 24 hr tablet Take 150 mg by mouth daily.   Yes [provider]  carbidopa-levodopa (SINEMET IR) 25-100 MG tablet Take 1 tablet by mouth in the morning, at noon, and at bedtime. 06/30/21  Yes [provider]  carboxymethylcellulose (REFRESH TEARS) 0.5 % SOLN Place 1 drop into both eyes 3 (three) times daily as needed (dry eyes).   Yes [provider]  citalopram (CELEXA) 40 MG tablet Take 40 mg by mouth daily.   Yes [provider]  ferrous sulfate 325 (65 FE) MG tablet Take 325 mg by mouth every Monday, Wednesday, and Friday.   Yes [provider]  fluticasone (FLONASE) 50 MCG/ACT nasal spray Place 1 spray into both nostrils at bedtime.   Yes [provider]  furosemide (  LASIX) 20 MG tablet Take 20 mg by mouth daily.   Yes [provider]  guaiFENesin (DIABETIC TUSSIN EX) 100 MG/5ML liquid Take 10 mLs by mouth every 4 (four) hours as needed for cough or to loosen phlegm.   Yes [provider]  guaiFENesin-codeine (ROBITUSSIN AC) 100-10 MG/5ML syrup Take 5 mLs by mouth at bedtime as needed for cough. 05/06/21  Yes [provider]  insulin glargine (LANTUS) 100 unit/mL SOPN Inject 16 Units into the skin daily.   Yes [provider]  ipratropium (ATROVENT) 0.03 % nasal spray Place 1 spray into both nostrils at bedtime.   Yes [provider]  lactulose (CHRONULAC) 10 GM/15ML solution Take 15 mLs (10 g total) by mouth daily. 02/17/21  Yes Janyah Singleterry, Carlota Raspberry, MD  latanoprost (XALATAN) 0.005 % ophthalmic solution Place 1  drop into both eyes at bedtime. 08/20/20  Yes [provider]  lidocaine (LIDODERM) 5 % Place 1 patch onto the skin daily as needed (pain). Remove & Discard patch within 12 hours or as directed by MD   Yes [provider]  loratadine (CLARITIN) 10 MG tablet Take 10 mg by mouth daily. 09/04/20  Yes [provider]  meclizine (ANTIVERT) 25 MG tablet Take 25 mg by mouth 2 (two) times daily as needed for dizziness.   Yes [provider]  Multiple Vitamin (MULTIVITAMIN WITH MINERALS) TABS tablet Take 1 tablet by mouth daily.   Yes [provider]  pantoprazole (PROTONIX) 40 MG tablet Take 40 mg by mouth daily with breakfast.   Yes [provider]  spironolactone (ALDACTONE) 100 MG tablet Take 50 mg by mouth every other day.   Yes [provider]  tamsulosin (FLOMAX) 0.4 MG CAPS capsule Take 0.4 mg by mouth 2 (two) times daily.   Yes [provider]  traZODone (DESYREL) 150 MG tablet Take 75 mg by mouth at bedtime as needed for sleep. 09/28/19  Yes [provider]  montelukast (SINGULAIR) 10 MG tablet Take 10 mg by mouth daily. 05/22/20   [provider]    No current facility-administered medications for this encounter.    Allergies as of 08/04/2021 - Review Complete 07/16/2021  Allergen Reaction Noted   Lisinopril Cough 07/12/2012    Family History  Problem Relation Age of Onset   Liver cancer Mother    Colon cancer Neg Hx    Stomach cancer Neg Hx    Colon polyps Neg Hx    Esophageal cancer Neg Hx    Rectal cancer Neg Hx     Social History   Socioeconomic History   Marital status: Married    Spouse name: Not on file   Number of children: Not on file   Years of education: Not on file   Highest education level: Not on file  Occupational History   Occupation: retired  Tobacco Use   Smoking status: Never    Passive exposure: Never   Smokeless tobacco: Never  Vaping Use   Vaping Use: Never used   Substance and Sexual Activity   Alcohol use: Not Currently   Drug use: Not Currently   Sexual activity: Not Currently  Other Topics Concern   Not on file  Social History Narrative   Not on file   Social Determinants of Health   Financial Resource Strain: Not on file  Food Insecurity: Not on file  Transportation Needs: Not on file  Physical Activity: Not on file  Stress: Not on file  Social Connections:  Not on file  Intimate Partner Violence: Not on file    Review of Systems: All other review of systems negative except as mentioned in the HPI.  Physical Exam: Vital signs BP 118/69   Pulse 89   Temp 98.6 F (37 C) (Temporal)   Resp (!) 24   Ht '5\' 6"'$  (1.676 m)   Wt 72.6 kg   SpO2 90%   BMI 25.82 kg/m   General:   Alert,  Well-developed, pleasant and cooperative in NAD Lungs:  Clear throughout to auscultation.   Heart:  Regular rate and rhythm Abdomen:  Soft, nontender and nondistended.   Neuro/Psych:  Alert and cooperative. Normal mood and affect. A and O x 3  Jolly Mango, MD [facility]

## 2021-10-15 NOTE — Anesthesia Procedure Notes (Signed)
Procedure Name: MAC Date/Time: 10/15/2021 7:32 AM  Performed by: Maxwell Caul, CRNAPre-anesthesia Checklist: Patient identified, Emergency Drugs available, Suction available and Patient being monitored Oxygen Delivery Method: Simple face mask

## 2021-10-15 NOTE — Anesthesia Preprocedure Evaluation (Addendum)
Anesthesia Evaluation  Patient identified by MRN, date of birth, ID band Patient awake    Reviewed: Allergy & Precautions, NPO status , Patient's Chart, lab work & pertinent test results  Airway Mallampati: II  TM Distance: >3 FB Neck ROM: Full    Dental  (+) Teeth Intact, Dental Advisory Given   Pulmonary asthma , sleep apnea , COPD,  COPD inhaler,    breath sounds clear to auscultation       Cardiovascular hypertension, + Valvular Problems/Murmurs  Rhythm:Regular Rate:Normal     Neuro/Psych PSYCHIATRIC DISORDERS Anxiety Depression CVA    GI/Hepatic GERD  Medicated,(+) Cirrhosis       ,   Endo/Other  diabetes, Type 2, Insulin DependentHypothyroidism   Renal/GU      Musculoskeletal  (+) Arthritis ,   Abdominal Normal abdominal exam  (+)   Peds  Hematology   Anesthesia Other Findings   Reproductive/Obstetrics                            Anesthesia Physical Anesthesia Plan  ASA: 3  Anesthesia Plan: MAC   Post-op Pain Management:    Induction: Intravenous  PONV Risk Score and Plan: 0 and Propofol infusion  Airway Management Planned: Natural Airway and Simple Face Mask  Additional Equipment: None  Intra-op Plan:   Post-operative Plan:   Informed Consent: I have reviewed the patients History and Physical, chart, labs and discussed the procedure including the risks, benefits and alternatives for the proposed anesthesia with the patient or authorized representative who has indicated his/her understanding and acceptance.       Plan Discussed with: CRNA  Anesthesia Plan Comments:        Anesthesia Quick Evaluation

## 2021-10-15 NOTE — Telephone Encounter (Signed)
Patient has an appointment for next Wed, 7-26

## 2021-10-19 ENCOUNTER — Encounter (HOSPITAL_COMMUNITY): Payer: Self-pay | Admitting: Gastroenterology

## 2021-10-20 ENCOUNTER — Telehealth: Payer: Self-pay | Admitting: Gastroenterology

## 2021-10-20 NOTE — Telephone Encounter (Signed)
Inbound call from patients wife stating that patient went to his Doctor at [facility] for his Parkinson's disease.  Patients wife stated that Dr. Isac Caddy shah-zamora was going to call to speak with Dr. Havery Moros about his medications. Patient seems to be falling a lot and she believes its coming from one of his medications he is on. Patiens wife just wanted to give a heads up that the doctor would be calling.

## 2021-10-20 NOTE — Telephone Encounter (Signed)
Okay thanks for the heads up, I will await their call.

## 2021-10-21 ENCOUNTER — Telehealth: Payer: Self-pay

## 2021-10-21 ENCOUNTER — Other Ambulatory Visit: Payer: Self-pay | Admitting: Gastroenterology

## 2021-10-21 ENCOUNTER — Ambulatory Visit (INDEPENDENT_AMBULATORY_CARE_PROVIDER_SITE_OTHER): Payer: No Typology Code available for payment source | Admitting: Gastroenterology

## 2021-10-21 ENCOUNTER — Encounter: Payer: Self-pay | Admitting: Gastroenterology

## 2021-10-21 VITALS — BP 110/66 | HR 89 | Ht 66.0 in | Wt 170.0 lb

## 2021-10-21 DIAGNOSIS — I8510 Secondary esophageal varices without bleeding: Secondary | ICD-10-CM | POA: Diagnosis not present

## 2021-10-21 DIAGNOSIS — I951 Orthostatic hypotension: Secondary | ICD-10-CM | POA: Diagnosis not present

## 2021-10-21 DIAGNOSIS — G20 Parkinson's disease: Secondary | ICD-10-CM

## 2021-10-21 DIAGNOSIS — J90 Pleural effusion, not elsewhere classified: Secondary | ICD-10-CM | POA: Diagnosis not present

## 2021-10-21 DIAGNOSIS — K7469 Other cirrhosis of liver: Secondary | ICD-10-CM

## 2021-10-21 DIAGNOSIS — I8500 Esophageal varices without bleeding: Secondary | ICD-10-CM

## 2021-10-21 DIAGNOSIS — K7460 Unspecified cirrhosis of liver: Secondary | ICD-10-CM

## 2021-10-21 DIAGNOSIS — I8501 Esophageal varices with bleeding: Secondary | ICD-10-CM

## 2021-10-21 MED ORDER — LACTULOSE 10 GM/15ML PO SOLN: 10.0000 g | Freq: Three times a day (TID) | ORAL | 2 refills | Status: DC | PRN

## 2021-10-21 NOTE — Progress Notes (Signed)
HPI :  71 year old male with a history of cirrhosis, here for follow-up visit for cirrhosis   History of cryptogenic cirrhosis, thought to be due to NASH.  Has been followed by hepatology with [place] in the past, followed also by multiple subspecialists at the [facility] for other medical problems.    See prior notes for extensive history.  Recall he has had extremely large esophageal varices and go up to gastric varices, portal hypertensive gastritis.  Intolerant of beta-blockade due to bradycardia and hypotension, had elected for primary therapy of varices to be done with band ligation.  He has had a complicated course with post banding ulcer bleeding.  He has had more than 40 bands placed over the past 2 years.  We have eventually achieved eradication of his varices to a smaller size, EGD last week with me where no bands were needed to be placed which is the first time he had reached that point.  He has done well in recent months without any problems with bleeding etc.  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 he has had thoracentesis, seen by hepatology.  He has 1 kidney, has some CKD.  He is on low-dose diuretics with Lasix 20 mg daily and Aldactone 50 mg daily.  He denies any edema in his legs but has had recurrent hydrothorax.  He does have some dyspnea with this and cough.  He has consider TIPS in the past and discussion with cardiology but has not wanted to pursue that to date.  Unfortunately has been diagnosed with Parkinson's and seeing neurology.  They have him on carbidopa levodopa, they are concerned about orthostatic hypotension and he has had falls recently, concerned about his falls risk moving forward.  They inquired to [place] if he can come off his diuretics to treat this, or start something to help with his blood pressure.  He had midodrine ordered yesterday by [name], hepatology, he has not started it yet.  He  does not have much of other ascites otherwise.  He does have a history of mild hepatic encephalopathy but not using much of any lactulose at baseline, takes as needed.  His wife is present today and we had a long discussion about whether or not he should proceed with TIPS evaluation or not.  Given his fall history, orthostatic hypotension, diuretic use, recurrent hydrothorax despite low-dose diuretic, I think this is reasonable after discussion of risk and benefits.    Prior workup: EGD 01/30/20 - Esophagogastric landmarks identified. - Large esophageal varices as described. - Erythematous mucosa in the gastric fundus and antrum, suspect portal hypertensive gastritis. Biopsies taken to rule out H pylori. - Suspected small type 2 gastroesophageal varices (GOV2, esophageal varices which extend along the fundus). - Normal duodenal bulb and second portion of the duodenum, very mild superficial erythema Noted.   Colonoscopy 01/30/20 - The perianal and digital rectal examinations were normal. - The terminal ileum appeared normal. - A 5 to 6 mm polyp was found in the transverse colon. The polyp was flat. The polyp was removed with a cold snare. Resection and retrieval were complete. - Moderately congested mucosa was found in the entire colon, suspected due to portal hypertension. - Internal hemorrhoids were found during retroflexion. - The exam was otherwise without abnormality.   1. Surgical [P], gastric antrum and gastric body - CHRONIC GASTRITIS. - WARTHIN-STARRY IS NEGATIVE FOR HELICOBACTER PYLORI. - NO INTESTINAL METAPLASIA, DYSPLASIA,  OR MALIGNANCY. 2. Surgical [P], colon, transverse, polyp - TUBULAR ADENOMA. - NO HIGH GRADE DYSPLASIA OR MALIGNANCY.     EGD 04/17/20 - Esophagogastric landmarks identified. - Large esophageal varices. Banded x 11. - Type 2 gastroesophageal varices (GOV2, esophageal varices which extend along the fundus). - Erythematous mucosa in the antrum. - Normal  duodenal bulb and second portion of the duodenum.   EGD 04/25/20 - Large (> 5 mm) esophageal varices. - Esophageal ulcers with no stigmata of recent bleeding. - Portal hypertensive gastropathy. - A medium amount of food (residue) in the stomach. - Normal examined duodenum. - No specimens collected.   EGD 04/30/20 - No gross lesions in esophagus proximally. - Grade I, grade II and grade III esophageal varices as well as multiple post-banding ulcers with a few ulcers showing active oozing. Near completely eradicated after 6 bands were placed. No active extravasation noted thereafter. - Clotted blood in the entire stomach - suctioned and lavaged with mild clearance and did not see active re-accumulation of bleeding. - Type 2 gastroesophageal varices (GOV2, esophageal varices which extend along the fundus) - not completely visualized as had been at time of first endoscopy but no sing of active bleeding, but recent stigmata or nipple sign could have been missed due to blood in fundus. - Blood in the duodenal bulb and in the second portion of the duodenum - lavaged away.   Echo 05/01/20 - EF 60-65%   EGD 06/10/20 - Esophagogastric landmarks identified. - Large esophageal varices but improved overall in size / appearance compared to initial exam. Banded x 6 in the lower to mid esophagus. - Type 2 gastroesophageal varices (GOV2, esophageal varices which extend along the fundus). - Portal hypertensive gastropathy. - Normal duodenal bulb and second portion of the duodenum.   Patient had a CVA in April, follow up EGD was cancelled   Ad[facility] to [facility]/21[facility]08[facility]30/20 for volume overload   EGD 11/10/20 - Esophagogastric landmarks identified. - Esophageal varices as described above - improvement in size over time but columns still persist, mostly medium in size, one large column. Banded x 6. - Portal hypertensive gastropathy. - Normal duodenal bulb and second portion of the duodenum.      EGD 02/10/21 - Grade II and small (< 5 mm) esophageal varices with no bleeding and no stigmata of recent bleeding. Banded x 3 today with good treatment response. Overall excellent response to EVL with now small varices. - Portal hypertensive gastropathy. No gastric varices seen. - Normal examined duodenum.   RUQ [place] 09/23/20 - IMPRESSION: Cirrhotic liver morphology.  No focal hepatic lesion.     EGD 03/31/21 - retained food in the stomach   EGD 04/21/21 - Dr. Ardis Hughs Three trunks of Grade II varices were found in the distal esophagus with scar evidence of previous banding procedures. Six bands were successfully placed with complete eradication, resulting in deflation of varices. There was no bleeding during the procedure. Mild portal gastropathy changes throughout the stomach. No gastric varices   RUQ 02/27/21: IMPRESSION: 1. Cirrhotic morphology liver.  No sonographic evidence of hepatoma. 2. Common bile duct dilation, likely post cholecystectomy distention.     EGD 10/15/21: Esophagogastric landmarks identified. - Grade I esophageal varices with flattening - extensive scarring from prior banding, improved. No further banding performed today - Portal hypertensive gastropathy. - Normal duodenal bulb and second portion of the duodenum.   EGD 07/16/21: One column of grade II varices with no bleeding and no stigmata of recent bleeding were  found in the distal esophagus,. They were 6 mm in largest diameter. No red wale signs were present. Scarring from prior treatment was visible. The varices appeared smaller than they were at prior exam. Two bands were successfully placed with complete eradication, resulting in deflation of varices. There was no bleeding during and at the end of the procedure. Findings: The exam of the esophagus was otherwise normal. Moderate portal hypertensive gastropathy was found in the entire examined stomach. No gastric varices. The exam of the stomach was  otherwise normal. The duodenal bulb and second portion of the duodenum were normal.    RUQ [place] 09/01/21 -  IMPRESSION: 1. Liver cirrhosis without evidence for solid mass by sonography 2. Right pleural effusion.  Free fluid in the right upper quadrant   Past Medical History:  Diagnosis Date   Allergy    seasonal allergies   Anxiety    on meds   Aortic valve disorder    Arthritis    generalized (fingers)(shoulders)   Asthma    uses inhaler   Cataract    bilateral -sx    Cirrhosis (HCC)    CKD (chronic kidney disease), stage III (HCC)    only has one kidney   COPD (chronic obstructive pulmonary disease) (HCC)    Depression    on meds   DM (diabetes mellitus) ([place])    on meds   Dyspnea    GERD (gastroesophageal reflux disease)    on meds   Heart murmur    History of colon polyps    History of COVID-19 10/14/2020   History of kidney stones    Hx of acute pancreatitis 10/2019   Hyperlipidemia    on meds   Hypertension    on meds   Hypothyroidism    not on meds at this time   Myelodysplastic syndrome ([place])    Neuromuscular disorder ([place])    per pt   Obstructive sleep apnea    Oxygen deficiency    Parkinsonism ([place])    per pt report   Peripheral edema    bilateral legs   Peripheral positional vertigo    Sleep apnea    No CPAP   Stroke [facility])      Past Surgical History:  Procedure Laterality Date   ANKLE SURGERY     CHOLECYSTECTOMY     ENDARTERECTOMY Left 04/29/2020   Procedure: CAROTID EXPLORATION removal of  central venous catheter;  Surgeon: Rosetta Posner, MD;  Location: [facility] OR;  Service: Vascular;  Laterality: Left;   ESOPHAGEAL BANDING N/A 04/17/2020   Procedure: ESOPHAGEAL BANDING;  Surgeon: Yetta Flock, MD;  Location: [facility];  Service: Gastroenterology;  Laterality: N/A;   ESOPHAGEAL BANDING  04/29/2020   Procedure: ESOPHAGEAL BANDING;  Surgeon: Rush Landmark Telford Nab., MD;  Location: [place];  Service: Gastroenterology;;   ESOPHAGEAL  BANDING N/A 06/10/2020   Procedure: ESOPHAGEAL BANDING;  Surgeon: Yetta Flock, MD;  Location: [facility];  Service: Gastroenterology;  Laterality: N/A;   ESOPHAGEAL BANDING N/A 11/10/2020   Procedure: ESOPHAGEAL BANDING;  Surgeon: Yetta Flock, MD;  Location: [facility];  Service: Gastroenterology;  Laterality: N/A;   ESOPHAGEAL BANDING N/A 02/10/2021   Procedure: ESOPHAGEAL BANDING;  Surgeon: Jerene Bears, MD;  Location: [facility];  Service: Gastroenterology;  Laterality: N/A;   ESOPHAGEAL BANDING N/A 04/21/2021   Procedure: ESOPHAGEAL BANDING;  Surgeon: Milus Banister, MD;  Location: [facility];  Service: Endoscopy;  Laterality: N/A;   ESOPHAGEAL BANDING N/A 07/16/2021  Procedure: ESOPHAGEAL BANDING;  Surgeon: Ladene Artist, MD;  Location[facility];  Service: Gastroenterology;  Laterality: N/A;   ESOPHAGOGASTRODUODENOSCOPY N/A 04/25/2020   Procedure: ESOPHAGOGASTRODUODENOSCOPY (EGD);  Surgeon: Carol Ada, MD;  Location: [facility];  Service: Endoscopy;  Laterality: N/A;   ESOPHAGOGASTRODUODENOSCOPY (EGD) WITH PROPOFOL N/A 04/17/2020   Procedure: ESOPHAGOGASTRODUODENOSCOPY (EGD) WITH PROPOFOL;  Surgeon: Yetta Flock, MD;  Location: [facility];  Service: Gastroenterology;  Laterality: N/A;   ESOPHAGOGASTRODUODENOSCOPY (EGD) WITH PROPOFOL N/A 04/29/2020   Procedure: ESOPHAGOGASTRODUODENOSCOPY (EGD) WITH PROPOFOL;  Surgeon: Rush Landmark Telford Nab., MD;  Location: [place];  Service: Gastroenterology;  Laterality: N/A;   ESOPHAGOGASTRODUODENOSCOPY (EGD) WITH PROPOFOL N/A 06/10/2020   Procedure: ESOPHAGOGASTRODUODENOSCOPY (EGD) WITH PROPOFOL;  Surgeon: Yetta Flock, MD;  [facility]: [facility];  Service: Gastroenterology;  Laterality: N/A;   ESOPHAGOGASTRODUODENOSCOPY (EGD) WITH PROPOFOL N/A 11/10/2020   Procedure: ESOPHAGOGASTRODUODENOSCOPY (EGD) WITH PROPOFOL;  Surgeon: Yetta Flock, MD;  [facility];  Service:  Gastroenterology;  Laterality: N/A;   ESOPHAGOGASTRODUODENOSCOPY (EGD) WITH PROPOFOL N/A 02/10/2021   Procedure: ESOPHAGOGASTRODUODENOSCOPY (EGD) WITH PROPOFOL;  Surgeon: Jerene Bears, MD;  [facility]: [facility];  Service: Gastroenterology;  Laterality: N/A;   ESOPHAGOGASTRODUODENOSCOPY (EGD) WITH PROPOFOL N/A 03/31/2021   Procedure: ESOPHAGOGASTRODUODENOSCOPY (EGD) WITH PROPOFOL;  Surgeon: Yetta Flock, MD;  [facility];  Service: Gastroenterology;  Laterality: N/A;   ESOPHAGOGASTRODUODENOSCOPY (EGD) WITH PROPOFOL N/A 04/21/2021   Procedure: ESOPHAGOGASTRODUODENOSCOPY (EGD) WITH PROPOFOL;  Surgeon: Milus Banister, MD;  [facility]: [facility];  Service: Endoscopy;  Laterality: N/A;   ESOPHAGOGASTRODUODENOSCOPY (EGD) WITH PROPOFOL N/A 07/16/2021   Procedure: ESOPHAGOGASTRODUODENOSCOPY (EGD) WITH PROPOFOL;  Surgeon: Ladene Artist, MD;  [facility]: [facility];  Service: Gastroenterology;  Laterality: N/A;   ESOPHAGOGASTRODUODENOSCOPY (EGD) WITH PROPOFOL N/A 10/15/2021   Procedure: ESOPHAGOGASTRODUODENOSCOPY (EGD) WITH PROPOFOL;  Surgeon: Yetta Flock, MD;  Location: [facility];  Service: Gastroenterology;  Laterality: N/A;   EYE SURGERY     HERNIA REPAIR     IR PARACENTESIS  09/18/2020   IR THORACENTESIS ASP PLEURAL SPACE W/IMG GUIDE  10/05/2021   KIDNEY STONE SURGERY     WISDOM TOOTH EXTRACTION     Family History  Problem Relation Age of Onset   Liver cancer Mother    Colon cancer Neg Hx    Stomach cancer Neg Hx    Colon polyps Neg Hx    Esophageal cancer Neg Hx    Rectal cancer Neg Hx    Social History   Tobacco Use   Smoking status: Never    Passive exposure: Never   Smokeless tobacco: Never  Vaping Use   Vaping Use: Never used  Substance Use Topics   Alcohol use: Not Currently   Drug use: Not Currently   Current Outpatient Medications  Medication Sig Dispense Refill   acetaminophen (TYLENOL) 325 MG tablet Take 325 mg by mouth at bedtime.      albuterol (VENTOLIN HFA) 108 (90 Base) MCG/ACT inhaler Inhale 1-2 puffs into the lungs every 6 (six) hours as needed for wheezing or shortness of breath.     ARTIFICIAL SALIVA MT Use as directed 1 spray in the mouth or throat as needed (Dry mouth). MOUTH COAT     atorvastatin (LIPITOR) 40 MG tablet Take 40 mg by mouth daily.     buPROPion (WELLBUTRIN XL) 150 MG 24 hr tablet Take 150 mg by mouth daily.     carbidopa-levodopa (SINEMET IR) 25-100 MG tablet Take 1 tablet by mouth in the morning, at noon, and at  bedtime.     carboxymethylcellulose (REFRESH TEARS) 0.5 % SOLN Place 1 drop into both eyes 3 (three) times daily as needed (dry eyes).     citalopram (CELEXA) 40 MG tablet Take 40 mg by mouth daily.     ferrous sulfate 325 (65 FE) MG tablet Take 325 mg by mouth every Monday, Wednesday, and Friday.     fluticasone (FLONASE) 50 MCG/ACT nasal spray Place 1 spray into both nostrils at bedtime.     furosemide (LASIX) 20 MG tablet Take 20 mg by mouth daily.     guaiFENesin (DIABETIC TUSSIN EX) 100 MG/5ML liquid Take 10 mLs by mouth every 4 (four) hours as needed for cough or to loosen phlegm.     guaiFENesin-codeine (ROBITUSSIN AC) 100-10 MG/5ML syrup Take 5 mLs by mouth at bedtime as needed for cough.     insulin glargine (LANTUS) 100 unit/mL SOPN Inject 16 Units into the skin daily.     ipratropium (ATROVENT) 0.03 % nasal spray Place 1 spray into both nostrils at bedtime.     lactulose (CHRONULAC) 10 GM/15ML solution Take 15 mLs (10 g total) by mouth daily. 473 mL 2   latanoprost (XALATAN) 0.005 % ophthalmic solution Place 1 drop into both eyes at bedtime.     lidocaine (LIDODERM) 5 % Place 1 patch onto the skin daily as needed (pain). Remove & Discard patch within 12 hours or as directed by MD     loratadine (CLARITIN) 10 MG tablet Take 10 mg by mouth daily.     meclizine (ANTIVERT) 25 MG tablet Take 25 mg by mouth 2 (two) times daily as needed for dizziness.     montelukast (SINGULAIR) 10 MG  tablet Take 10 mg by mouth daily.     Multiple Vitamin (MULTIVITAMIN WITH MINERALS) TABS tablet Take 1 tablet by mouth daily.     pantoprazole (PROTONIX) 40 MG tablet Take 40 mg by mouth daily with breakfast.     spironolactone (ALDACTONE) 100 MG tablet Take 50 mg by mouth every other day.     tamsulosin (FLOMAX) 0.4 MG CAPS capsule Take 0.4 mg by mouth 2 (two) times daily.     traZODone (DESYREL) 150 MG tablet Take 75 mg by mouth at bedtime as needed for sleep.     No current facility-administered medications for this visit.   Allergies  Allergen Reactions   Lisinopril Cough     Review of Systems: All systems reviewed and negative except where noted in HPI.    IR THORACENTESIS ASP PLEURAL SPACE W/IMG GUIDE  Result Date: 10/05/2021 INDICATION: Patient with history of cirrhosis and ascites previous right upper quadrant ultrasound on 09/01/2021 showed right pleural effusion. Patient also reports multiple falls, he fell last week and hit his right ribs. Request for therapeutic thoracentesis. EXAM: ULTRASOUND GUIDED RIGHT THORACENTESIS MEDICATIONS: 7 mL 1% lidocaine COMPLICATIONS: None immediate. PROCEDURE: An ultrasound guided thoracentesis was thoroughly discussed with the patient and questions answered. The benefits, risks, alternatives and complications were also discussed. The patient understands and wishes to proceed with the procedure. Written consent was obtained. Ultrasound was performed to localize and mark an adequate pocket of fluid in the right chest. The area was then prepped and draped in the normal sterile fashion. 1% Lidocaine was used for local anesthesia. Under ultrasound guidance a 6 Fr Safe-T-Centesis catheter was introduced. Thoracentesis was performed. The catheter was removed and a dressing applied. FINDINGS: A total of approximately 500 mL of bloody fluid was removed. Post procedure chest X-ray reviewed,  negative for pneumothorax. IMPRESSION: Successful ultrasound guided  right thoracentesis yielding 500 mL of pleural fluid. Read by: Durenda Guthrie, PA-C Electronically Signed   By: Ruthann Cancer M.D.   On: 10/05/2021 09:39   DG Chest 1 View  Result Date: 10/05/2021 CLINICAL DATA:  Post thoracentesis on the RIGHT. Post removal of 500 cc by report. EXAM: CHEST  1 VIEW COMPARISON:  May 01, 2021. FINDINGS: Cardiomediastinal contours and hilar structures are stable. Small RIGHT-sided pleural effusion persists post thoracentesis without visible pneumothorax. Moderate LEFT-sided pleural effusion is of grossly similar volume to previous imaging. Bibasilar airspace disease also similar to previous imaging. On limited assessment there is no acute skeletal finding. IMPRESSION: 1. No pneumothorax following RIGHT thoracentesis. 2. Small RIGHT with stable moderate LEFT-sided pleural effusion and persistent bibasilar airspace disease. 3. Stable heart size. Electronically Signed   By: Zetta Bills M.D.   On: 10/05/2021 09:20    Lab Results  Component Value Date   WBC 3.7 (L) 02/26/2021   HGB 9.7 (L) 02/26/2021   HCT 30.9 (L) 02/26/2021   MCV 93.9 02/26/2021   PLT 103 (L) 02/26/2021    Lab Results  Component Value Date   CREATININE 1.89 (H) 02/26/2021   BUN 22 02/26/2021   NA 136 02/26/2021   K 4.0 02/26/2021   CL 102 02/26/2021   CO2 27 02/26/2021    Lab Results  Component Value Date   ALT 23 02/26/2021   AST 29 02/26/2021   ALKPHOS 105 02/26/2021   BILITOT 0.9 02/26/2021    Lab Results  Component Value Date   INR 1.4 (H) 02/17/2021   INR 1.1 10/14/2020   INR 1.2 05/04/2020    MELD 3.0: 17 at 02/17/2021  3:42 PM MELD-Na: 18 at 02/17/2021  3:42 PM Calculated from: Serum Creatinine: 1.75 mg/dL at 02/17/2021  3:42 PM Serum Sodium: 135 mEq/L at 02/17/2021  3:42 PM Total Bilirubin: 0.7 mg/dL (Using min of 1 mg/dL) at 02/17/2021  3:42 PM Serum Albumin: 3.4 g/dL at 02/17/2021  3:42 PM INR(ratio): 1.4 ratio at 02/17/2021  3:42 PM Age at listing  (hypothetical): 70 years Sex: Male at 02/17/2021  3:42 PM   Physical Exam: BP 110/66   Pulse 89   Ht '5\' 6"'$  (1.676 m)   Wt 170 lb (77.1 kg)   BMI 27.44 kg/m  Constitutional: Pleasant,well-developed, male in no acute distress. Pulmonary/chest: Effort normal. Breath sounds decreased at R base Neurological: Alert and oriented to person place and time. Psychiatric: Normal mood and affect. Behavior is normal.   ASSESSMENT AND PLAN: 71 year old male here for reassessment of the following:  Cirrhosis Esophageal varices Hepatic hydrothorax Orthostatic hypotension Parkinson's  Complex medical history as outlined above.  Decompensated cirrhosis with history of large esophageal varices and now hepatic hydrothorax.  He has 1 kidney, limited in her diuretic use to treat this in this light as well as due to his orthostatic hypotension and recent diagnosis of Parkinson's, history of falls.  He is having his Parkinson regimen titrated up which is needed for his movement disorder, this can affect his blood pressure, I agree with starting midodrine to help support his blood pressure.  His blood pressure with benefit from coming off the diuretics although I am worried about worsening hydrothorax if/when he does this.  I do not think the low-dose diuretics at baseline are adequately treating his lung given persistent effusion there, his baseline dyspnea and cough.  His breath sounds are decreased on exam today.  We discussed other  options.  I think it is reasonable to be evaluated for TIPS to treat the hydrothorax, allow him to come off diuretics, and more definitively treat his varices which were large.  Fortunately he has had several banding's now where I think his risk for bleeding is low.  We discussed risks versus benefits of TIPS.  He does have some mild questional encephalopathy at baseline, we discussed how that could become worse and he would need to take lactulose routinely post TIPS if he were to  proceed with it, however its not a contraindication for him to have the intervention.  Hopefully this would treat the hydrothorax and his varices, allow him to come off diuretics, improve his blood pressure and reduce his risk for falls.  After discussion about this they are in agreement for TIPS evaluation.  I will refer him to IR.  I encouraged them to start midodrine as prescribed yesterday, he will continue his low-dose diuretics for now to reduce his risk for worsening of the hydrothorax in the interim.  I will refill his lactulose.  [place] screening up-to-date.  Plan: - refer to IR for TIPS evaluation - start midodrine '10mg'$  TID for BP support as above  - continue low dose diuretics for now until TIPS is done, prevent worsening hydrothorax. He may need thoracentesis until more definitive intervention, he will call if any worsening symptoms, he is stable at this time.  - refill lactulose - will need this routinely post TIPS if he has it  Jolly Mango, MD [facility] Gastroenterology

## 2021-10-21 NOTE — Patient Instructions (Addendum)
If you are age 73 or older, your body mass index should be between 23-30. Your Body mass index is 27.44 kg/m. If this is out of the aforementioned range listed, please consider follow up with your Primary Care Provider.  If you are age 66 or younger, your body mass index should be between 19-25. Your Body mass index is 27.44 kg/m. If this is out of the aformentioned range listed, please consider follow up with your Primary Care Provider.   ________________________________________________________  The [facility] providers would like to encourage you to use [facility] to communicate with providers for non-urgent requests or questions.  Due to long hold times on the telephone, sending your provider a message by [facility] may be a faster and more efficient way to get a response.  Please allow 48 business hours for a response.  Please remember that this is for non-urgent requests.  _______________________________________________________  We will refer you to Interventional Radiology for TIPS evaluation.  They will contact you to get you scheduled.  We have sent the following medications to your pharmacy for you to pick up at your convenience: Lactulose: Take three times a day as needed  Thank you for entrusting me with your care and for choosing [facility], Dr. Morenci Cellar

## 2021-10-21 NOTE — Telephone Encounter (Signed)
error 

## 2021-10-21 NOTE — Telephone Encounter (Signed)
Roby Lofts at [facility] [phone] and ordered the IR evaluation for TIPS.  They will call patient to schedule the eval

## 2021-10-22 ENCOUNTER — Encounter: Payer: Self-pay | Admitting: *Deleted

## 2021-10-22 ENCOUNTER — Ambulatory Visit
Admission: RE | Admit: 2021-10-22 | Discharge: 2021-10-22 | Disposition: A | Discharge status: Home / Self Care | Payer: No Typology Code available for payment source | Source: Ambulatory Visit | Attending: Gastroenterology | Admitting: Gastroenterology

## 2021-10-22 DIAGNOSIS — K7469 Other cirrhosis of liver: Secondary | ICD-10-CM

## 2021-10-22 DIAGNOSIS — I8501 Esophageal varices with bleeding: Secondary | ICD-10-CM

## 2021-10-22 HISTORY — PX: IR RADIOLOGIST EVAL & MGMT: IMG5224

## 2021-10-22 NOTE — Consult Note (Signed)
Chief Complaint:  Cryptogenic cirrhosis, TIPS consultation  Referring Physician(s): Armbruster,Steven P  History of Present Illness: Corey Wood is a 71 y.o. male with a history of cryptogenic cirrhosis thought to be secondary to NASH.  He is followed by hepatology with [facility] in [place] as well as [place] [facility] and the [place].  Additional history includes prior history of large esophageal varices as well as gastric varices and portal gastropathy.  He has had multiple endoscopies in the past with laparoscopic gastric banding.  He reports 1 remote episode of acute upper GI bleeding approximately 2 years ago when he was diagnosed and started to have the routine EGDs and banding procedures.  No recent urgent or emergent upper GI bleeds.  Recently he developed some chest symptoms and shortness of breath and was found to have pleural effusions, larger on the right thought to be related to hepatic hydrothorax.  He had a recent thoracentesis removing nearly a liter of fluid.  Apparently he does not develop significant ascites related to his liver disease and has not had a recent paracentesis or required 1.  He remains on low-dose diuretics with Lasix and Aldactone.  He continues to have some mild shortness of breath and chronic cough.  Additionally he has mild Parkinson disease and is followed by neurology.  They are treating him with carbidopa/levodopa.  Also, he has orthostatic hypotension which results in several recent falls.  These have not resulted in a fracture at this point.  Overall, he does have mild hepatic encephalopathy and remains on daily lactulose with several bowel movements a day.  His wife is with him today and is primary caregiver.  Additional data: He had a previous echo in February 2022 demonstrating an ejection fraction of 60 to 65%.  No elevated right heart pressure.  Recent labs from 09/30/2021 demonstrate a calculated MELD sodium score of 11.  Previous  notes demonstrated a calculated MELD score of 17-18.  I think his MELD score is better today because of his improved creatinine of 1.47.  Patient is known to have 1 functioning kidney.  Otherwise his hepatic function is normal.  Review of his cross-sectional imaging demonstrates some previous CTs without contrast.  There is liver morphology compatible with cirrhosis.  Spleen is enlarged.  Exams were all done without contrast because of his mildly elevated creatinine.    Past Medical History:  Diagnosis Date   Allergy    seasonal allergies   Anxiety    on meds   Aortic valve disorder    Arthritis    generalized (fingers)(shoulders)   Asthma    uses inhaler   Cataract    bilateral -sx    Cirrhosis (HCC)    CKD (chronic kidney disease), stage III (HCC)    only has one kidney   COPD (chronic obstructive pulmonary disease) (HCC)    Depression    on meds   DM (diabetes mellitus) ([place])    on meds   Dyspnea    GERD (gastroesophageal reflux disease)    on meds   Heart murmur    History of colon polyps    History of COVID-19 10/14/2020   History of kidney stones    Hx of acute pancreatitis 10/2019   Hyperlipidemia    on meds   Hypertension    on meds   Hypothyroidism    not on meds at this time   Myelodysplastic syndrome ([place])    Neuromuscular disorder ([place])    per pt  Obstructive sleep apnea    Oxygen deficiency    Parkinsonism (HCC)    per pt report   Peripheral edema    bilateral legs   Peripheral positional vertigo    Sleep apnea    No CPAP   Stroke [facility])     Past Surgical History:  Procedure Laterality Date   ANKLE SURGERY     CHOLECYSTECTOMY     ENDARTERECTOMY Left 04/29/2020   Procedure: CAROTID EXPLORATION removal of  central venous catheter;  Surgeon: Rosetta Posner, MD;  Location: [place];  Service: Vascular;  Laterality: Left;   ESOPHAGEAL BANDING N/A 04/17/2020   Procedure: ESOPHAGEAL BANDING;  Surgeon: Yetta Flock, MD;  [facility];   Service: Gastroenterology;  Laterality: N/A;   ESOPHAGEAL BANDING  04/29/2020   Procedure: ESOPHAGEAL BANDING;  Surgeon: Rush Landmark Telford Nab., MD;  Location: [place];  Service: Gastroenterology;;   ESOPHAGEAL BANDING N/A 06/10/2020   Procedure: ESOPHAGEAL BANDING;  Surgeon: Yetta Flock, MD;  [facility];  Service: Gastroenterology;  Laterality: N/A;   ESOPHAGEAL BANDING N/A 11/10/2020   Procedure: ESOPHAGEAL BANDING;  Surgeon: Yetta Flock, MD;  [facility];  Service: Gastroenterology;  Laterality: N/A;   ESOPHAGEAL BANDING N/A 02/10/2021   Procedure: ESOPHAGEAL BANDING;  Surgeon: Jerene Bears, MD;  [facility];  Service: Gastroenterology;  Laterality: N/A;   ESOPHAGEAL BANDING N/A 04/21/2021   Procedure: ESOPHAGEAL BANDING;  Surgeon: Milus Banister, MD;  [facility];  Service: Endoscopy;  Laterality: N/A;   ESOPHAGEAL BANDING N/A 07/16/2021   Procedure: ESOPHAGEAL BANDING;  Surgeon: Ladene Artist, MD;  [facility];  Service: Gastroenterology;  Laterality: N/A;   ESOPHAGOGASTRODUODENOSCOPY N/A 04/25/2020   Procedure: ESOPHAGOGASTRODUODENOSCOPY (EGD);  Surgeon: Carol Ada, MD;  [facility];  Service: Endoscopy;  Laterality: N/A;   ESOPHAGOGASTRODUODENOSCOPY (EGD) WITH PROPOFOL N/A 04/17/2020   Procedure: ESOPHAGOGASTRODUODENOSCOPY (EGD) WITH PROPOFOL;  Surgeon: Yetta Flock, MD;  [facility];  Service: Gastroenterology;  Laterality: N/A;   ESOPHAGOGASTRODUODENOSCOPY (EGD) WITH PROPOFOL N/A 04/29/2020   Procedure: ESOPHAGOGASTRODUODENOSCOPY (EGD) WITH PROPOFOL;  Surgeon: Rush Landmark Telford Nab., MD;  Location: [place];  Service: Gastroenterology;  Laterality: N/A;   ESOPHAGOGASTRODUODENOSCOPY (EGD) WITH PROPOFOL N/A 06/10/2020   Procedure: ESOPHAGOGASTRODUODENOSCOPY (EGD) WITH PROPOFOL;  Surgeon: Yetta Flock, MD;  [facility];  Service: Gastroenterology;  Laterality:  N/A;   ESOPHAGOGASTRODUODENOSCOPY (EGD) WITH PROPOFOL N/A 11/10/2020   Procedure: ESOPHAGOGASTRODUODENOSCOPY (EGD) WITH PROPOFOL;  Surgeon: Yetta Flock, MD;  [facility];  Service: Gastroenterology;  Laterality: N/A;   ESOPHAGOGASTRODUODENOSCOPY (EGD) WITH PROPOFOL N/A 02/10/2021   Procedure: ESOPHAGOGASTRODUODENOSCOPY (EGD) WITH PROPOFOL;  Surgeon: Jerene Bears, MD;  [facility];  Service: Gastroenterology;  Laterality: N/A;   ESOPHAGOGASTRODUODENOSCOPY (EGD) WITH PROPOFOL N/A 03/31/2021   Procedure: ESOPHAGOGASTRODUODENOSCOPY (EGD) WITH PROPOFOL;  Surgeon: Yetta Flock, MD;  [facility];  Service: Gastroenterology;  Laterality: N/A;   ESOPHAGOGASTRODUODENOSCOPY (EGD) WITH PROPOFOL N/A 04/21/2021   Procedure: ESOPHAGOGASTRODUODENOSCOPY (EGD) WITH PROPOFOL;  Surgeon: Milus Banister, MD;  [facility];  Service: Endoscopy;  Laterality: N/A;   ESOPHAGOGASTRODUODENOSCOPY (EGD) WITH PROPOFOL N/A 07/16/2021   Procedure: ESOPHAGOGASTRODUODENOSCOPY (EGD) WITH PROPOFOL;  Surgeon: Ladene Artist, MD;  [facility];  Service: Gastroenterology;  Laterality: N/A;   ESOPHAGOGASTRODUODENOSCOPY (EGD) WITH PROPOFOL N/A 10/15/2021   Procedure: ESOPHAGOGASTRODUODENOSCOPY (EGD) WITH PROPOFOL;  Surgeon: Yetta Flock, MD;  [facility];  Service: Gastroenterology;  Laterality: N/A;   EYE SURGERY  HERNIA REPAIR     IR PARACENTESIS  09/18/2020   IR THORACENTESIS ASP PLEURAL SPACE W/IMG GUIDE  10/05/2021   KIDNEY STONE SURGERY     WISDOM TOOTH EXTRACTION      Allergies: Lisinopril  Medications: Prior to Admission medications   Medication Sig Start Date End Date Taking? Authorizing Provider  acetaminophen (TYLENOL) 325 MG tablet Take 325 mg by mouth at bedtime.    [provider]  albuterol (VENTOLIN HFA) 108 (90 Base) MCG/ACT inhaler Inhale 1-2 puffs into the lungs every 6 (six) hours as needed for wheezing or  shortness of breath.    [provider]  ARTIFICIAL SALIVA MT Use as directed 1 spray in the mouth or throat as needed (Dry mouth). MOUTH COAT 10/26/20   [provider]  atorvastatin (LIPITOR) 40 MG tablet Take 40 mg by mouth daily.    [provider]  buPROPion (WELLBUTRIN XL) 150 MG 24 hr tablet Take 150 mg by mouth daily.    [provider]  carbidopa-levodopa (SINEMET IR) 25-100 MG tablet Take 1 tablet by mouth in the morning, at noon, and at bedtime. 06/30/21   [provider]  carboxymethylcellulose (REFRESH TEARS) 0.5 % SOLN Place 1 drop into both eyes 3 (three) times daily as needed (dry eyes).    [provider]  citalopram (CELEXA) 40 MG tablet Take 40 mg by mouth daily.    [provider]  ferrous sulfate 325 (65 FE) MG tablet Take 325 mg by mouth every Monday, Wednesday, and Friday.    [provider]  fluticasone (FLONASE) 50 MCG/ACT nasal spray Place 1 spray into both nostrils at bedtime.    [provider]  furosemide (LASIX) 20 MG tablet Take 20 mg by mouth daily.    [provider]  guaiFENesin (DIABETIC TUSSIN EX) 100 MG/5ML liquid Take 10 mLs by mouth every 4 (four) hours as needed for cough or to loosen phlegm.    [provider]  guaiFENesin-codeine (ROBITUSSIN AC) 100-10 MG/5ML syrup Take 5 mLs by mouth at bedtime as needed for cough. 05/06/21   [provider]  insulin glargine (LANTUS) 100 unit/mL SOPN Inject 16 Units into the skin daily.    [provider]  ipratropium (ATROVENT) 0.03 % nasal spray Place 1 spray into both nostrils at bedtime.    [provider]  lactulose (CHRONULAC) 10 GM/15ML solution Take 15 mLs (10 g total) by mouth 3 (three) times daily as needed for mild constipation. 10/21/21   Armbruster, Carlota Raspberry, MD  latanoprost (XALATAN) 0.005 % ophthalmic solution Place 1 drop into both eyes at bedtime. 08/20/20   [provider]   lidocaine (LIDODERM) 5 % Place 1 patch onto the skin daily as needed (pain). Remove & Discard patch within 12 hours or as directed by MD    [provider]  loratadine (CLARITIN) 10 MG tablet Take 10 mg by mouth daily. 09/04/20   [provider]  meclizine (ANTIVERT) 25 MG tablet Take 25 mg by mouth 2 (two) times daily as needed for dizziness.    [provider]  midodrine (PROAMATINE) 10 MG tablet Take 1 tablet (10 mg total) by mouth 3 (three) times daily. 10/21/21   Armbruster, Carlota Raspberry, MD  montelukast (SINGULAIR) 10 MG tablet Take 10 mg by mouth daily. 05/22/20   [provider]  Multiple Vitamin (MULTIVITAMIN WITH MINERALS) TABS tablet Take 1 tablet by mouth daily.    [provider]  pantoprazole (PROTONIX) 40  MG tablet Take 40 mg by mouth daily with breakfast.    [provider]  spironolactone (ALDACTONE) 100 MG tablet Take 50 mg by mouth every other day.    [provider]  tamsulosin (FLOMAX) 0.4 MG CAPS capsule Take 0.4 mg by mouth 2 (two) times daily.    [provider]  traZODone (DESYREL) 150 MG tablet Take 75 mg by mouth at bedtime as needed for sleep. 09/28/19   [provider]     Family History  Problem Relation Age of Onset   Liver cancer Mother    Colon cancer Neg Hx    Stomach cancer Neg Hx    Colon polyps Neg Hx    Esophageal cancer Neg Hx    Rectal cancer Neg Hx     Social History   Socioeconomic History   Marital status: Married    Spouse name: Not on file   Number of children: Not on file   Years of education: Not on file   Highest education level: Not on file  Occupational History   Occupation: retired  Tobacco Use   Smoking status: Never    Passive exposure: Never   Smokeless tobacco: Never  Vaping Use   Vaping Use: Never used  Substance and Sexual Activity   Alcohol use: Not Currently   Drug use: Not Currently   Sexual activity: Not Currently  Other Topics Concern   Not  on file  Social History Narrative   Not on file   Social Determinants of Health   Financial Resource Strain: Not on file  Food Insecurity: Not on file  Transportation Needs: Not on file  Physical Activity: Not on file  Stress: Not on file  Social Connections: Not on file     Review of Systems: A 12 point ROS discussed and pertinent positives are indicated in the HPI above.  All other systems are negative.  Review of Systems  Vital Signs: There were no vitals taken for this visit.    Physical Exam Constitutional:      Appearance: Normal appearance. He is not toxic-appearing or diaphoretic.  Eyes:     General: No scleral icterus.    Conjunctiva/sclera: Conjunctivae normal.  Cardiovascular:     Rate and Rhythm: Normal rate and regular rhythm.     Heart sounds: Murmur heard.  Pulmonary:     Effort: Pulmonary effort is normal.     Breath sounds: Normal breath sounds.     Comments: Diminished basilar breath sounds suggesting pleural effusions, larger on the right Abdominal:     General: Bowel sounds are normal.     Palpations: Abdomen is soft.  Skin:    Coloration: Skin is not jaundiced.     Comments: Multiple arm and extremity abrasions related to the recent falls.  Neurological:     General: No focal deficit present.     Mental Status: He is alert. Mental status is at baseline.  Psychiatric:        Mood and Affect: Mood normal.        Thought Content: Thought content normal.        Judgment: Judgment normal.       Imaging: IR THORACENTESIS ASP PLEURAL SPACE W/IMG GUIDE  Result Date: 10/05/2021 INDICATION: Patient with history of cirrhosis and ascites previous right upper quadrant ultrasound on 09/01/2021 showed right pleural effusion. Patient also reports multiple falls, he fell last week and hit his right ribs. Request for therapeutic thoracentesis. EXAM: ULTRASOUND GUIDED RIGHT THORACENTESIS  MEDICATIONS: 7 mL 1% lidocaine COMPLICATIONS: None immediate.  PROCEDURE: An ultrasound guided thoracentesis was thoroughly discussed with the patient and questions answered. The benefits, risks, alternatives and complications were also discussed. The patient understands and wishes to proceed with the procedure. Written consent was obtained. Ultrasound was performed to localize and mark an adequate pocket of fluid in the right chest. The area was then prepped and draped in the normal sterile fashion. 1% Lidocaine was used for local anesthesia. Under ultrasound guidance a 6 Fr Safe-T-Centesis catheter was introduced. Thoracentesis was performed. The catheter was removed and a dressing applied. FINDINGS: A total of approximately 500 mL of bloody fluid was removed. Post procedure chest X-ray reviewed, negative for pneumothorax. IMPRESSION: Successful ultrasound guided right thoracentesis yielding 500 mL of pleural fluid. Read by: Durenda Guthrie, PA-C Electronically Signed   By: Ruthann Cancer M.D.   On: 10/05/2021 09:39   DG Chest 1 View  Result Date: 10/05/2021 CLINICAL DATA:  Post thoracentesis on the RIGHT. Post removal of 500 cc by report. EXAM: CHEST  1 VIEW COMPARISON:  May 01, 2021. FINDINGS: Cardiomediastinal contours and hilar structures are stable. Small RIGHT-sided pleural effusion persists post thoracentesis without visible pneumothorax. Moderate LEFT-sided pleural effusion is of grossly similar volume to previous imaging. Bibasilar airspace disease also similar to previous imaging. On limited assessment there is no acute skeletal finding. IMPRESSION: 1. No pneumothorax following RIGHT thoracentesis. 2. Small RIGHT with stable moderate LEFT-sided pleural effusion and persistent bibasilar airspace disease. 3. Stable heart size. Electronically Signed   By: Zetta Bills M.D.   On: 10/05/2021 09:20    Labs:  CBC: Recent Labs    02/17/21 1542 02/26/21 1345  WBC 2.7* 3.7*  HGB 9.5* 9.7*  HCT 28.0* 30.9*  PLT 98.0* 103*    COAGS: Recent Labs     02/17/21 1542  INR 1.4*    BMP: Recent Labs    02/17/21 1542 02/26/21 1040 02/26/21 1345  NA 135 135 136  K 4.3 4.4 4.0  CL 101 101 102  CO2 '27 31 27  '$ GLUCOSE 197* 115* 226*  BUN 25* 23 22  CALCIUM 9.4 9.7 9.5  CREATININE 1.75* 1.86* 1.89*  GFRNONAA  --   --  37*    LIVER FUNCTION TESTS: Recent Labs    02/17/21 1542 02/26/21 1345  BILITOT 0.7 0.9  AST 24 29  ALT 18 23  ALKPHOS 120* 105  PROT 8.1 8.1  ALBUMIN 3.4* 3.1*    TUMOR MARKERS: Recent Labs    02/17/21 1542  AFPTM 1.3    Assessment and Plan:  71 year old debilitated male with Parkinson's as well as cryptogenic cirrhosis/NASH.  This has resulted in portal hypertension with esophageal and gastric varices status post multiple endoscopic banding procedures.  Recent development of a right pleural effusion (presumed hepatic hydrothorax) which required thoracentesis.  Chronic orthostatic hypotension possibly related to his diuretic therapy.  The TIPS procedure was reviewed in detail including a diagram for clear explanation.  The patient and his wife have a clear understanding and have discussed the procedure with his other doctors as well.  They are in agreement with having a TIPS procedure at this point.  All questions addressed.  He understands the procedure does require general anesthesia and overnight observation for recovery.  Expected goals and outcomes reviewed.  Successful TIPS procedure would hopefully treat his hepatic hydrothorax as well as his varices and allow him to come off the diuretics to improve his blood pressure/orthostatic hypotension.  Plan:  Scheduled for elective TIPS with anesthesia at [facility].  Thank you for this interesting consult.  I greatly enjoyed meeting Corey Wood and look forward to participating in their care.  A copy of this report was sent to the requesting provider on this date.  Electronically Signed: Greggory Keen 10/22/2021, 3:15 PM   I spent a total of  60  Minutes   in face to face in clinical consultation, greater than 50% of which was counseling/coordinating care for this patient with cryptogenic cirrhosis, esophageal varices, and recent hepatic hydrothorax requiring thoracentesis

## 2021-10-23 ENCOUNTER — Telehealth: Payer: Self-pay | Admitting: Gastroenterology

## 2021-10-23 NOTE — Telephone Encounter (Signed)
Jocelyn Lamer from [facility] called about patient's TIPS procedure.  She said patient has [place] will need a referral sent to them through [facility].  She said you may call her directly at [phone].  Thank you.

## 2021-10-23 NOTE — Telephone Encounter (Signed)
Called and spoke to [place]. Let her know I have completed the [facility] Provider - Request for Service form for the IR Consult for TIPS and faxed to the [place] at [phone].  Once we get a confirmation back we need to fax to [place] at [place]  at Fax: [phone]

## 2021-10-27 NOTE — Telephone Encounter (Signed)
Called and spoke to referral department at [phone] ext [phone]. The referral will go to the nurse navigator first and they will get approval, then it will go to the referral dept for approval.  It may take one to two weeks. Once approved they will let [place] and the veteran know. If we haven't heard back within 2 weeks call [phone]. Two weeks will be 11-06-21

## 2021-11-05 ENCOUNTER — Other Ambulatory Visit: Payer: Self-pay | Admitting: Nurse Practitioner

## 2021-11-05 DIAGNOSIS — I8510 Secondary esophageal varices without bleeding: Secondary | ICD-10-CM

## 2021-11-11 ENCOUNTER — Other Ambulatory Visit: Payer: No Typology Code available for payment source

## 2021-11-12 ENCOUNTER — Telehealth: Payer: Self-pay | Admitting: Gastroenterology

## 2021-11-12 NOTE — Telephone Encounter (Signed)
Patient's wife call, requesting a call back as soon as possible.

## 2021-11-12 NOTE — Telephone Encounter (Signed)
Returned call to [employer]. I spoke with patient, he stated that she was outside mowing right now. I asked that he have her give [place] a call back when she is available. Pt voiced understanding.

## 2021-11-12 NOTE — Telephone Encounter (Signed)
Received fax confirmation sheet that fax was successfully sent.

## 2021-11-12 NOTE — Telephone Encounter (Signed)
Community [facility] Provider Request for Service form re-faxed to the [place] at [phone]

## 2021-11-12 NOTE — Telephone Encounter (Signed)
Returned call to patient's wife.She states that patient's appt with Dr. Annamaria Boots was cancelled because Dr. Fritz Pickerel office did not know how to obtain [facility] authorization. I informed Ivin Booty that Jan sent the service request 3 weeks ago and she called to follow up today. I told her that unfortunately they stated that they did not receive the request and I re-faxed it today. I told her that we will be in touch soon with an update.

## 2021-11-12 NOTE — Telephone Encounter (Signed)
Patient's wife returned your call.  Please call back when you can.

## 2021-11-17 ENCOUNTER — Telehealth: Payer: Self-pay

## 2021-11-17 NOTE — Telephone Encounter (Signed)
Office note from 10/21/2021 and cbc result faxed to [facility] at fax # [phone]. Okay per Dr Havery Moros to fax for dental extraction clearance information for them.

## 2021-11-17 NOTE — Telephone Encounter (Signed)
PT wife is calling to get an up date on the TIPS procedure. Wants to know if it has been scheduled yet. Please advise.

## 2021-11-18 NOTE — Telephone Encounter (Signed)
[facility]ed [facility] and was referred to nurse navigator Carroll Kinds (IR referrals) at [phone]. Left detailed message for Tabitha re: referral for IR Eval for TIPS that was placed many weeks ago and still waiting approval.- Asked her to call Herbert Seta, RN back with an update on the referral. Patient and Vickie at [facility] waiting on update.

## 2021-11-19 ENCOUNTER — Telehealth: Payer: Self-pay | Admitting: Gastroenterology

## 2021-11-19 NOTE — Telephone Encounter (Signed)
Returned call to Lacey with the [place]. He stated that he was just calling us to let us know that the request has been submitted. He states that he is not able to give me a turn around time, but he did make a note that this referral needs to be done "quickly". He informed me that [place] will receive a fax with the information.

## 2021-11-19 NOTE — Telephone Encounter (Signed)
This is a duplicate phone note. See 11/12/21 telephone encounter for details.

## 2021-11-19 NOTE — Telephone Encounter (Signed)
Carroll Kinds from the [place] called to inform you the correct person to contact for the [place] authorization is Julieta Gutting he handles GI issues with the [place] [phone]. Thank you

## 2021-11-19 NOTE — Telephone Encounter (Signed)
Kant from community care nurse [facility], called regarding patient radiology request. Requesting a call back as soon as possible.

## 2021-11-25 ENCOUNTER — Other Ambulatory Visit (HOSPITAL_COMMUNITY): Payer: Self-pay | Admitting: Interventional Radiology

## 2021-11-25 DIAGNOSIS — K7460 Unspecified cirrhosis of liver: Secondary | ICD-10-CM

## 2021-11-26 NOTE — Telephone Encounter (Signed)
[place] authorization # [id number]. Authorization was faxed to [facility] on 11/23/21.

## 2021-12-01 ENCOUNTER — Telehealth: Payer: Self-pay

## 2021-12-01 NOTE — Telephone Encounter (Signed)
Called and spoke with Corey Wood. Pt has been scheduled for an appt tomorrow on Wednesday, 12/02/21 at 11:30 am. Corey Wood is aware that they should arrive about 15 minutes prior to the appt. Corey Wood verbalized understanding and had no concerns at the end of the call.

## 2021-12-01 NOTE — Telephone Encounter (Signed)
-----   Message from Yetta Flock, MD sent at 12/01/2021  7:33 AM EDT ----- Regarding: RE: follow up clinic visit I can see Mr. Rudy at 60 tomorrow AM if he is free then? You can add him into my clinic schedule. Thanks  ----- Message ----- From: Yevette Edwards, RN Sent: 11/26/2021   2:36 PM EDT To: Yetta Flock, MD Subject: RE: follow up clinic visit                     No, those are full with banding appts.  ----- Message ----- From: Yetta Flock, MD Sent: 11/26/2021   1:53 PM EDT To: Yevette Edwards, RN Subject: RE: follow up clinic visit                     Are there any hemorrhoid banding slots we can use? ----- Message ----- From: Yevette Edwards, RN Sent: 11/26/2021   1:53 PM EDT To: Yetta Flock, MD Subject: RE: follow up clinic visit                     Dr. Havery Moros, Your next available appt is not until 01/12/22 at 11 am and this is a triage appt. Is this OK? Thanks ----- Message ----- From: Yetta Flock, MD Sent: 11/26/2021   1:45 PM EDT To: Yevette Edwards, RN Subject: follow up clinic visit                         Herbert Seta can you help schedule Mr. Speegle a follow-up clinic visit with me.  Sounds like his wife wanted to discuss possible TIPS again.  I think they saw another GI provider who recommended against it, while I recommended it for him.  Thanks.

## 2021-12-02 ENCOUNTER — Encounter: Payer: Self-pay | Admitting: Gastroenterology

## 2021-12-02 ENCOUNTER — Ambulatory Visit: Payer: No Typology Code available for payment source | Admitting: Gastroenterology

## 2021-12-02 ENCOUNTER — Ambulatory Visit (INDEPENDENT_AMBULATORY_CARE_PROVIDER_SITE_OTHER): Payer: No Typology Code available for payment source | Admitting: Gastroenterology

## 2021-12-02 VITALS — BP 128/70 | HR 109 | Ht 66.0 in | Wt 161.8 lb

## 2021-12-02 DIAGNOSIS — J948 Other specified pleural conditions: Secondary | ICD-10-CM | POA: Diagnosis not present

## 2021-12-02 DIAGNOSIS — K7460 Unspecified cirrhosis of liver: Secondary | ICD-10-CM

## 2021-12-02 DIAGNOSIS — I951 Orthostatic hypotension: Secondary | ICD-10-CM | POA: Diagnosis not present

## 2021-12-02 DIAGNOSIS — I8500 Esophageal varices without bleeding: Secondary | ICD-10-CM | POA: Diagnosis not present

## 2021-12-02 DIAGNOSIS — G20 Parkinson's disease: Secondary | ICD-10-CM

## 2021-12-02 NOTE — Progress Notes (Signed)
HPI :  71 year old male with a history of cirrhosis, here for follow-up visit for cirrhosis   Recall he has a history of cryptogenic cirrhosis, thought to be due to NASH.  Has been followed by hepatology with [place] in the past, followed also by multiple subspecialists at the [facility] for other medical problems.    See prior notes for extensive history.  Recall he has had extremely large esophageal varices and GOV2 gastric varices, portal hypertensive gastritis.  Intolerant of beta-blockade due to bradycardia and hypotension, had elected for primary therapy of varices to be done with band ligation.  He has had a complicated course with post banding ulcer bleeding.  He has had more than 40 bands placed over the past 2 years.  We have eventually achieved eradication of his varices to a smaller size, EGD last done in July, no bands were needed to be placed which is the first time he had reached that point.   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  He also has 1 kidney with some CKD, on low-dose diuretics, intolerant to higher dosing diuretics due to hypotension and recurrent falls in the setting of Parkinson's.  He is on midodrine now to help with his blood pressure and that has been helping him.  He has not had any lower extremity edema but does have cough with dyspnea.  He has had evaluation for TIPS in the past by myself, hepatology, and IR, all have thought he has not been a good candidate for this if effusion is causing him problems because he is limited in his diuretic use.  He was scheduled for this however canceled it after a recent visit with his GI physician at the [place]. they discussed risks of TIPS, patient was told reportedly to consider hospice instead.  Patient is accompanied by his wife today and they were a bit upset by this conversation.  He has been doing okay since  of last seen him.  He has not been admitted to the hospital.  He is not on oxygen.  He does have a mild effusion there which does cause dyspnea and cough.  Again he ambulates slowly due to his history of falls and Parkinson's and orthostatic hypotension.  He has not had any bad falls since have seen him.  He has mild hepatic encephalopathy but not using much of any lactulose at baseline.  We discussed again if he wanted to proceed with TIPS or not.  He says he had some recent labs done at the [place], I do not have any results of that on file today.    Prior workup: EGD 01/30/20 - Esophagogastric landmarks identified. - Large esophageal varices as described. - Erythematous mucosa in the gastric fundus and antrum, suspect portal hypertensive gastritis. Biopsies taken to rule out H pylori. - Suspected small type 2 gastroesophageal varices (GOV2, esophageal varices which extend along the fundus). - Normal duodenal bulb and second portion of the duodenum, very mild superficial erythema Noted.   Colonoscopy 01/30/20 - The perianal and digital rectal examinations were normal. - The terminal ileum appeared normal. - A 5 to 6 mm polyp was found in the transverse colon. The polyp was flat. The polyp was removed with a cold snare. Resection and retrieval were complete. - Moderately congested mucosa was found in the entire colon, suspected due to portal hypertension. - Internal hemorrhoids were found during  retroflexion. - The exam was otherwise without abnormality.   1. Surgical [P], gastric antrum and gastric body - CHRONIC GASTRITIS. - WARTHIN-STARRY IS NEGATIVE FOR HELICOBACTER PYLORI. - NO INTESTINAL METAPLASIA, DYSPLASIA, OR MALIGNANCY. 2. Surgical [P], colon, transverse, polyp - TUBULAR ADENOMA. - NO HIGH GRADE DYSPLASIA OR MALIGNANCY.     EGD 04/17/20 - Esophagogastric landmarks identified. - Large esophageal varices. Banded x 11. - Type 2 gastroesophageal varices (GOV2, esophageal varices  which extend along the fundus). - Erythematous mucosa in the antrum. - Normal duodenal bulb and second portion of the duodenum.   EGD 04/25/20 - Large (> 5 mm) esophageal varices. - Esophageal ulcers with no stigmata of recent bleeding. - Portal hypertensive gastropathy. - A medium amount of food (residue) in the stomach. - Normal examined duodenum. - No specimens collected.   EGD 04/30/20 - No gross lesions in esophagus proximally. - Grade I, grade II and grade III esophageal varices as well as multiple post-banding ulcers with a few ulcers showing active oozing. Near completely eradicated after 6 bands were placed. No active extravasation noted thereafter. - Clotted blood in the entire stomach - suctioned and lavaged with mild clearance and did not see active re-accumulation of bleeding. - Type 2 gastroesophageal varices (GOV2, esophageal varices which extend along the fundus) - not completely visualized as had been at time of first endoscopy but no sing of active bleeding, but recent stigmata or nipple sign could have been missed due to blood in fundus. - Blood in the duodenal bulb and in the second portion of the duodenum - lavaged away.   Echo 05/01/20 - EF 60-65%   EGD 06/10/20 - Esophagogastric landmarks identified. - Large esophageal varices but improved overall in size / appearance compared to initial exam. Banded x 6 in the lower to mid esophagus. - Type 2 gastroesophageal varices (GOV2, esophageal varices which extend along the fundus). - Portal hypertensive gastropathy. - Normal duodenal bulb and second portion of the duodenum.   Patient had a CVA in April, follow up EGD was cancelled   [facility]20 for volume overload   EGD 11/10/20 - Esophagogastric landmarks identified. - Esophageal varices as described above - improvement in size over time but columns still persist, mostly medium in size, one large column. Banded x 6. - Portal  hypertensive gastropathy. - Normal duodenal bulb and second portion of the duodenum.     EGD 02/10/21 - Grade II and small (< 5 mm) esophageal varices with no bleeding and no stigmata of recent bleeding. Banded x 3 today with good treatment response. Overall excellent response to EVL with now small varices. - Portal hypertensive gastropathy. No gastric varices seen. - Normal examined duodenum.   RUQ [place] 09/23/20 - IMPRESSION: Cirrhotic liver morphology.  No focal hepatic lesion.     EGD 03/31/21 - retained food in the stomach   EGD 04/21/21 - Dr. Ardis Hughs Three trunks of Grade II varices were found in the distal esophagus with scar evidence of previous banding procedures. Six bands were successfully placed with complete eradication, resulting in deflation of varices. There was no bleeding during the procedure. Mild portal gastropathy changes throughout the stomach. No gastric varices   RUQ 02/27/21: IMPRESSION: 1. Cirrhotic morphology liver.  No sonographic evidence of hepatoma. 2. Common bile duct dilation, likely post cholecystectomy distention.     EGD 10/15/21: Esophagogastric landmarks identified. - Grade I esophageal varices with flattening - extensive scarring from prior banding, improved. No further banding performed today -  Portal hypertensive gastropathy. - Normal duodenal bulb and second portion of the duodenum.     EGD 07/16/21: One column of grade II varices with no bleeding and no stigmata of recent bleeding were found in the distal esophagus,. They were 6 mm in largest diameter. No red wale signs were present. Scarring from prior treatment was visible. The varices appeared smaller than they were at prior exam. Two bands were successfully placed with complete eradication, resulting in deflation of varices. There was no bleeding during and at the end of the procedure.  The exam of the esophagus was otherwise normal. Moderate portal hypertensive gastropathy was found in  the entire examined stomach. No gastric varices. The exam of the stomach was otherwise normal. The duodenal bulb and second portion of the duodenum were normal.       RUQ [place] 09/01/21 -  IMPRESSION: 1. Liver cirrhosis without evidence for solid mass by sonography 2. Right pleural effusion.  Free fluid in the right upper quadrant         Past Medical History:  Diagnosis Date   Allergy    seasonal allergies   Anxiety    on meds   Aortic valve disorder    Arthritis    generalized (fingers)(shoulders)   Asthma    uses inhaler   Cataract    bilateral -sx    Cirrhosis (HCC)    CKD (chronic kidney disease), stage III (HCC)    only has one kidney   COPD (chronic obstructive pulmonary disease) (HCC)    Depression    on meds   DM (diabetes mellitus) ([place])    on meds   Dyspnea    GERD (gastroesophageal reflux disease)    on meds   Heart murmur    History of colon polyps    History of COVID-19 10/14/2020   History of kidney stones    Hx of acute pancreatitis 10/2019   Hyperlipidemia    on meds   Hypertension    on meds   Hypothyroidism    not on meds at this time   Myelodysplastic syndrome ([place])    Neuromuscular disorder ([place])    per pt   Obstructive sleep apnea    Oxygen deficiency    Parkinsonism ([place])    per pt report   Peripheral edema    bilateral legs   Peripheral positional vertigo    Sleep apnea    No CPAP   Stroke [facility])      Past Surgical History:  Procedure Laterality Date   ANKLE SURGERY     CHOLECYSTECTOMY     ENDARTERECTOMY Left 04/29/2020   Procedure: CAROTID EXPLORATION removal of  central venous catheter;  Surgeon: Rosetta Posner, MD;  Location: [facility] OR;  Service: Vascular;  Laterality: Left;   ESOPHAGEAL BANDING N/A 04/17/2020   Procedure: ESOPHAGEAL BANDING;  Surgeon: Yetta Flock, MD;  Location: [facility];  Service: Gastroenterology;  Laterality: N/A;   ESOPHAGEAL BANDING  04/29/2020   Procedure: ESOPHAGEAL BANDING;  Surgeon:  Rush Landmark Telford Nab., MD;  Location: [place];  Service: Gastroenterology;;   ESOPHAGEAL BANDING N/A 06/10/2020   Procedure: ESOPHAGEAL BANDING;  Surgeon: Yetta Flock, MD;  Location: [facility];  Service: Gastroenterology;  Laterality: N/A;   ESOPHAGEAL BANDING N/A 11/10/2020   Procedure: ESOPHAGEAL BANDING;  Surgeon: Yetta Flock, MD;  Location: [facility];  Service: Gastroenterology;  Laterality: N/A;   ESOPHAGEAL BANDING N/A 02/10/2021   Procedure: ESOPHAGEAL BANDING;  Surgeon: Jerene Bears, MD;  Location: [facility];  Service: Gastroenterology;  Laterality: N/A;   ESOPHAGEAL BANDING N/A 04/21/2021   Procedure: ESOPHAGEAL BANDING;  Surgeon: Milus Banister, MD;  Location: [facility];  Service: Endoscopy;  Laterality: N/A;   ESOPHAGEAL BANDING N/A 07/16/2021   Procedure: ESOPHAGEAL BANDING;  Surgeon: Ladene Artist, MD;  Location: [facility];  Service: Gastroenterology;  Laterality: N/A;   ESOPHAGOGASTRODUODENOSCOPY N/A 04/25/2020   Procedure: ESOPHAGOGASTRODUODENOSCOPY (EGD);  Surgeon: Carol Ada, MD;  Location: [facility];  Service: Endoscopy;  Laterality: N/A;   ESOPHAGOGASTRODUODENOSCOPY (EGD) WITH PROPOFOL N/A 04/17/2020   Procedure: ESOPHAGOGASTRODUODENOSCOPY (EGD) WITH PROPOFOL;  Surgeon: Yetta Flock, MD;  [facility]: [facility];  Service: Gastroenterology;  Laterality: N/A;   ESOPHAGOGASTRODUODENOSCOPY (EGD) WITH PROPOFOL N/A 04/29/2020   Procedure: ESOPHAGOGASTRODUODENOSCOPY (EGD) WITH PROPOFOL;  Surgeon: Rush Landmark Telford Nab., MD;  Location: [place];  Service: Gastroenterology;  Laterality: N/A;   ESOPHAGOGASTRODUODENOSCOPY (EGD) WITH PROPOFOL N/A 06/10/2020   Procedure: ESOPHAGOGASTRODUODENOSCOPY (EGD) WITH PROPOFOL;  Surgeon: Yetta Flock, MD;  [facility];  Service: Gastroenterology;  Laterality: N/A;   ESOPHAGOGASTRODUODENOSCOPY (EGD) WITH PROPOFOL N/A 11/10/2020   Procedure: ESOPHAGOGASTRODUODENOSCOPY (EGD) WITH  PROPOFOL;  Surgeon: Yetta Flock, MD;  [facility];  Service: Gastroenterology;  Laterality: N/A;   ESOPHAGOGASTRODUODENOSCOPY (EGD) WITH PROPOFOL N/A 02/10/2021   Procedure: ESOPHAGOGASTRODUODENOSCOPY (EGD) WITH PROPOFOL;  Surgeon: Jerene Bears, MD;  [facility];  Service: Gastroenterology;  Laterality: N/A;   ESOPHAGOGASTRODUODENOSCOPY (EGD) WITH PROPOFOL N/A 03/31/2021   Procedure: ESOPHAGOGASTRODUODENOSCOPY (EGD) WITH PROPOFOL;  Surgeon: Yetta Flock, MD;  [facility];  Service: Gastroenterology;  Laterality: N/A;   ESOPHAGOGASTRODUODENOSCOPY (EGD) WITH PROPOFOL N/A 04/21/2021   Procedure: ESOPHAGOGASTRODUODENOSCOPY (EGD) WITH PROPOFOL;  Surgeon: Milus Banister, MD;  [facility];  Service: Endoscopy;  Laterality: N/A;   ESOPHAGOGASTRODUODENOSCOPY (EGD) WITH PROPOFOL N/A 07/16/2021   Procedure: ESOPHAGOGASTRODUODENOSCOPY (EGD) WITH PROPOFOL;  Surgeon: Ladene Artist, MD;  [facility]: [facility];  Service: Gastroenterology;  Laterality: N/A;   ESOPHAGOGASTRODUODENOSCOPY (EGD) WITH PROPOFOL N/A 10/15/2021   Procedure: ESOPHAGOGASTRODUODENOSCOPY (EGD) WITH PROPOFOL;  Surgeon: Yetta Flock, MD;  Location: [facility];  Service: Gastroenterology;  Laterality: N/A;   EYE SURGERY     HERNIA REPAIR     IR PARACENTESIS  09/18/2020   IR RADIOLOGIST EVAL & MGMT  10/22/2021   IR THORACENTESIS ASP PLEURAL SPACE W/IMG GUIDE  10/05/2021   KIDNEY STONE SURGERY     WISDOM TOOTH EXTRACTION     Family History  Problem Relation Age of Onset   Liver cancer Mother    Colon cancer Neg Hx    Stomach cancer Neg Hx    Colon polyps Neg Hx    Esophageal cancer Neg Hx    Rectal cancer Neg Hx    Social History   Tobacco Use   Smoking status: Never    Passive exposure: Never   Smokeless tobacco: Never  Vaping Use   Vaping Use: Never used  Substance Use Topics   Alcohol use: Not Currently   Drug use: Not Currently   Current Outpatient  Medications  Medication Sig Dispense Refill   acetaminophen (TYLENOL) 325 MG tablet Take 325 mg by mouth at bedtime.     albuterol (VENTOLIN HFA) 108 (90 Base) MCG/ACT inhaler Inhale 1-2 puffs into the lungs every 6 (six) hours as needed for wheezing or shortness of breath.     ARTIFICIAL SALIVA MT Use as directed 1 spray in the mouth or throat as needed (Dry mouth). MOUTH COAT  atorvastatin (LIPITOR) 40 MG tablet Take 40 mg by mouth daily.     buPROPion (WELLBUTRIN XL) 150 MG 24 hr tablet Take 150 mg by mouth daily.     carbidopa-levodopa (SINEMET IR) 25-100 MG tablet Take 1 tablet by mouth in the morning, at noon, and at bedtime.     carboxymethylcellulose (REFRESH TEARS) 0.5 % SOLN Place 1 drop into both eyes 3 (three) times daily as needed (dry eyes).     citalopram (CELEXA) 40 MG tablet Take 40 mg by mouth daily.     ferrous sulfate 325 (65 FE) MG tablet Take 325 mg by mouth every Monday, Wednesday, and Friday.     fluticasone (FLONASE) 50 MCG/ACT nasal spray Place 1 spray into both nostrils at bedtime.     furosemide (LASIX) 20 MG tablet Take 20 mg by mouth daily.     guaiFENesin (DIABETIC TUSSIN EX) 100 MG/5ML liquid Take 10 mLs by mouth every 4 (four) hours as needed for cough or to loosen phlegm.     insulin glargine (LANTUS) 100 unit/mL SOPN Inject 16 Units into the skin daily.     ipratropium (ATROVENT) 0.03 % nasal spray Place 1 spray into both nostrils at bedtime.     lactulose (CHRONULAC) 10 GM/15ML solution Take 15 mLs (10 g total) by mouth 3 (three) times daily as needed for mild constipation. 1350 mL 2   latanoprost (XALATAN) 0.005 % ophthalmic solution Place 1 drop into both eyes at bedtime.     lidocaine (LIDODERM) 5 % Place 1 patch onto the skin daily as needed (pain). Remove & Discard patch within 12 hours or as directed by MD     loratadine (CLARITIN) 10 MG tablet Take 10 mg by mouth daily.     meclizine (ANTIVERT) 25 MG tablet Take 25 mg by mouth 2 (two) times daily as  needed for dizziness.     midodrine (PROAMATINE) 10 MG tablet Take 1 tablet (10 mg total) by mouth 3 (three) times daily.     montelukast (SINGULAIR) 10 MG tablet Take 10 mg by mouth daily.     Multiple Vitamin (MULTIVITAMIN WITH MINERALS) TABS tablet Take 1 tablet by mouth daily.     pantoprazole (PROTONIX) 40 MG tablet Take 40 mg by mouth daily with breakfast.     spironolactone (ALDACTONE) 100 MG tablet Take 50 mg by mouth every other day.     tamsulosin (FLOMAX) 0.4 MG CAPS capsule Take 0.4 mg by mouth 2 (two) times daily.     traZODone (DESYREL) 150 MG tablet Take 75 mg by mouth at bedtime as needed for sleep.     guaiFENesin-codeine (ROBITUSSIN AC) 100-10 MG/5ML syrup Take 5 mLs by mouth at bedtime as needed for cough. (Patient not taking: Reported on 12/02/2021)     No current facility-administered medications for this visit.   Allergies  Allergen Reactions   Lisinopril Cough     Review of Systems: All systems reviewed and negative except where noted in HPI.   Labs reviewed in care everywhere  Physical Exam: BP 128/70   Pulse (!) 109   Ht 5' 6"  (1.676 m)   Wt 161 lb 12.8 oz (73.4 kg)   SpO2 90%   BMI 26.12 kg/m  Constitutional: Pleasant, male in no acute distress. Pulmonary/chest: Effort normal. Decreased BS at R base.   Extremities: no edema Neurological: Alert and oriented to person place and time. Psychiatric: Normal mood and affect. Behavior is normal.   ASSESSMENT: 71 y.o. male here for  assessment of the following  1. Hepatic cirrhosis, unspecified hepatic cirrhosis type, unspecified whether ascites present ([place])   2. Esophageal varices without bleeding, unspecified esophageal varices type (HCC)   3. Hydrothorax   4. Orthostatic hypotension   5. Parkinson's disease ([place])    Decompensated cirrhosis with history of large esophageal varices and hepatic hydrothorax.  He has 1 kidney, limited in diuretic use to treat this in this light as well as due to his  orthostatic hypotension and recent diagnosis of Parkinson's, history of falls. Now on midocrine for BP support which has helped. He has not had any falls since I have seen him.  He continues to have a persistent right-sided effusion which I think is impairing his breathing and causing cough.  We had previously discussed TIPS to treat his hydrothorax given he does not have much room to escalate diuretic dosing and treatment options are limited.  We had previously discussed risk benefits and want to proceed, now after visit with another GI at the [place] he wants to discuss this issue again.  He has been offered hospice, the patient is otherwise stable and not in the hospital recently and his breathing is at baseline.  They are not ready for hospice following discussion of that today.  We again reviewed potential benefits versus risks of TIPS to treat the hydrothorax primarily, as the varices have otherwise been eradicated with banding.  He understands the risks of this, if his breathing is really limiting his quality of life and affecting his ability to function and I think benefits of the TIPS would outweigh the risks.  I have spoken with [name] of his hepatology team who agrees.  Patient is not sure what he wants to do at this point he wants to think about this more.  He is due to see pulmonology tomorrow, and had recent labs done which for which I will get the records.  He will hold off on making a decision for this today.  He will think about this with his wife, I will see him back in the office in 4 to 6 weeks for reassessment.  If he has worsening respiratory status, hospitalization etc. in the interim we may have to consider this more urgently.  Otherwise continue present meds for now.  PLAN: - patient will continue to think about TIPS for now, wants to hold off on scheduling. If respiratory status worsens we do not have much ability to increase his diuretics, he understands options are thoracentesis as  needed and may lead to hospitalization - obtain up to date labs from the [place], drawn recently - follow up with pulmonary tomorrow - follow up with me in 4-6 weeks or sooner if issues - continue present medications  Jolly Mango, MD [facility]

## 2021-12-02 NOTE — Patient Instructions (Signed)
Please have the [facility] fax the most recent labs to [phone].  The [place] providers would like to encourage you to use [facility] to communicate with providers for non-urgent requests or questions.  Due to long hold times on the telephone, sending your provider a message by [facility] may be a faster and more efficient way to get a response.  Please allow 48 business hours for a response.  Please remember that this is for non-urgent requests.

## 2021-12-06 ENCOUNTER — Telehealth: Payer: Self-pay | Admitting: Gastroenterology

## 2021-12-06 NOTE — Telephone Encounter (Signed)
Labs arrived from the [place]:  11/24/21: WBC 3.3, Hgb 10.0, plt 93  11/19/21: INR 1.1   Jan can you let the patient know I reviewed his labs, they seem stable compared to when I last saw them. Thanks

## 2021-12-08 NOTE — Telephone Encounter (Signed)
Thanks Jan, really sorry to hear this.  This was my concern for him about not having the TIPS, his recurrent pleural effusion leading to need for oxygen, hospitalization, and drainage.  If he is having active issues with this he should the IR folks about possible TIPS again.

## 2021-12-08 NOTE — Telephone Encounter (Signed)
Called and spoke to wife Ivin Booty and let her know re  labs results rec'd from [place]. She indicated they are currently at the hospital. His oxygen was at 87% and they are going to draw off fluid from around the lung today.  She indicated they hope to have home O2 delivered today, a delay from what they had hoped for last week.

## 2021-12-29 ENCOUNTER — Other Ambulatory Visit (HOSPITAL_COMMUNITY): Payer: Self-pay | Admitting: Interventional Radiology

## 2021-12-29 DIAGNOSIS — K7460 Unspecified cirrhosis of liver: Secondary | ICD-10-CM

## 2022-01-12 DIAGNOSIS — I8510 Secondary esophageal varices without bleeding: Secondary | ICD-10-CM | POA: Diagnosis not present

## 2022-01-12 DIAGNOSIS — J948 Other specified pleural conditions: Secondary | ICD-10-CM | POA: Diagnosis not present

## 2022-01-12 DIAGNOSIS — K7469 Other cirrhosis of liver: Secondary | ICD-10-CM | POA: Diagnosis not present

## 2022-01-17 ENCOUNTER — Encounter (HOSPITAL_COMMUNITY): Payer: Self-pay

## 2022-01-17 ENCOUNTER — Emergency Department (HOSPITAL_COMMUNITY): Payer: No Typology Code available for payment source

## 2022-01-17 ENCOUNTER — Other Ambulatory Visit: Payer: Self-pay

## 2022-01-17 ENCOUNTER — Inpatient Hospital Stay (HOSPITAL_COMMUNITY)
Admission: EM | Admit: 2022-01-17 | Discharge: 2022-01-20 | DRG: 432 | Disposition: A | Discharge status: Home Health | Payer: No Typology Code available for payment source | Attending: Internal Medicine | Admitting: Internal Medicine

## 2022-01-17 DIAGNOSIS — I359 Nonrheumatic aortic valve disorder, unspecified: Secondary | ICD-10-CM | POA: Diagnosis present

## 2022-01-17 DIAGNOSIS — R011 Cardiac murmur, unspecified: Secondary | ICD-10-CM | POA: Diagnosis not present

## 2022-01-17 DIAGNOSIS — E039 Hypothyroidism, unspecified: Secondary | ICD-10-CM | POA: Diagnosis present

## 2022-01-17 DIAGNOSIS — K3189 Other diseases of stomach and duodenum: Secondary | ICD-10-CM | POA: Diagnosis not present

## 2022-01-17 DIAGNOSIS — N183 Chronic kidney disease, stage 3 unspecified: Secondary | ICD-10-CM

## 2022-01-17 DIAGNOSIS — R001 Bradycardia, unspecified: Secondary | ICD-10-CM | POA: Diagnosis not present

## 2022-01-17 DIAGNOSIS — D638 Anemia in other chronic diseases classified elsewhere: Secondary | ICD-10-CM | POA: Diagnosis not present

## 2022-01-17 DIAGNOSIS — J90 Pleural effusion, not elsewhere classified: Secondary | ICD-10-CM | POA: Diagnosis not present

## 2022-01-17 DIAGNOSIS — E872 Acidosis, unspecified: Secondary | ICD-10-CM | POA: Diagnosis not present

## 2022-01-17 DIAGNOSIS — N1832 Chronic kidney disease, stage 3b: Secondary | ICD-10-CM | POA: Diagnosis not present

## 2022-01-17 DIAGNOSIS — E876 Hypokalemia: Secondary | ICD-10-CM | POA: Diagnosis present

## 2022-01-17 DIAGNOSIS — J302 Other seasonal allergic rhinitis: Secondary | ICD-10-CM | POA: Diagnosis present

## 2022-01-17 DIAGNOSIS — I8510 Secondary esophageal varices without bleeding: Secondary | ICD-10-CM | POA: Diagnosis not present

## 2022-01-17 DIAGNOSIS — D61818 Other pancytopenia: Secondary | ICD-10-CM | POA: Diagnosis present

## 2022-01-17 DIAGNOSIS — K766 Portal hypertension: Secondary | ICD-10-CM | POA: Diagnosis present

## 2022-01-17 DIAGNOSIS — R791 Abnormal coagulation profile: Secondary | ICD-10-CM | POA: Diagnosis present

## 2022-01-17 DIAGNOSIS — K7469 Other cirrhosis of liver: Secondary | ICD-10-CM | POA: Diagnosis not present

## 2022-01-17 DIAGNOSIS — Z8673 Personal history of transient ischemic attack (TIA), and cerebral infarction without residual deficits: Secondary | ICD-10-CM

## 2022-01-17 DIAGNOSIS — I129 Hypertensive chronic kidney disease with stage 1 through stage 4 chronic kidney disease, or unspecified chronic kidney disease: Secondary | ICD-10-CM | POA: Diagnosis present

## 2022-01-17 DIAGNOSIS — D696 Thrombocytopenia, unspecified: Secondary | ICD-10-CM | POA: Diagnosis present

## 2022-01-17 DIAGNOSIS — I4891 Unspecified atrial fibrillation: Secondary | ICD-10-CM | POA: Diagnosis present

## 2022-01-17 DIAGNOSIS — F419 Anxiety disorder, unspecified: Secondary | ICD-10-CM | POA: Diagnosis present

## 2022-01-17 DIAGNOSIS — N179 Acute kidney failure, unspecified: Secondary | ICD-10-CM | POA: Diagnosis present

## 2022-01-17 DIAGNOSIS — K922 Gastrointestinal hemorrhage, unspecified: Principal | ICD-10-CM

## 2022-01-17 DIAGNOSIS — D62 Acute posthemorrhagic anemia: Secondary | ICD-10-CM | POA: Diagnosis present

## 2022-01-17 DIAGNOSIS — E785 Hyperlipidemia, unspecified: Secondary | ICD-10-CM | POA: Diagnosis present

## 2022-01-17 DIAGNOSIS — I9589 Other hypotension: Secondary | ICD-10-CM | POA: Diagnosis present

## 2022-01-17 DIAGNOSIS — Z9049 Acquired absence of other specified parts of digestive tract: Secondary | ICD-10-CM

## 2022-01-17 DIAGNOSIS — E1122 Type 2 diabetes mellitus with diabetic chronic kidney disease: Secondary | ICD-10-CM | POA: Diagnosis present

## 2022-01-17 DIAGNOSIS — D469 Myelodysplastic syndrome, unspecified: Secondary | ICD-10-CM | POA: Diagnosis present

## 2022-01-17 DIAGNOSIS — Z888 Allergy status to other drugs, medicaments and biological substances status: Secondary | ICD-10-CM

## 2022-01-17 DIAGNOSIS — K920 Hematemesis: Secondary | ICD-10-CM | POA: Diagnosis not present

## 2022-01-17 DIAGNOSIS — D649 Anemia, unspecified: Secondary | ICD-10-CM | POA: Diagnosis not present

## 2022-01-17 DIAGNOSIS — Z8616 Personal history of COVID-19: Secondary | ICD-10-CM

## 2022-01-17 DIAGNOSIS — Z794 Long term (current) use of insulin: Secondary | ICD-10-CM

## 2022-01-17 DIAGNOSIS — F32A Depression, unspecified: Secondary | ICD-10-CM | POA: Diagnosis present

## 2022-01-17 DIAGNOSIS — N1831 Chronic kidney disease, stage 3a: Secondary | ICD-10-CM

## 2022-01-17 DIAGNOSIS — I8511 Secondary esophageal varices with bleeding: Secondary | ICD-10-CM | POA: Diagnosis not present

## 2022-01-17 DIAGNOSIS — G20A1 Parkinson's disease without dyskinesia, without mention of fluctuations: Secondary | ICD-10-CM | POA: Diagnosis present

## 2022-01-17 DIAGNOSIS — K7460 Unspecified cirrhosis of liver: Secondary | ICD-10-CM | POA: Diagnosis present

## 2022-01-17 DIAGNOSIS — I959 Hypotension, unspecified: Secondary | ICD-10-CM | POA: Diagnosis not present

## 2022-01-17 DIAGNOSIS — Z8719 Personal history of other diseases of the digestive system: Secondary | ICD-10-CM

## 2022-01-17 DIAGNOSIS — J449 Chronic obstructive pulmonary disease, unspecified: Secondary | ICD-10-CM | POA: Diagnosis present

## 2022-01-17 DIAGNOSIS — Z01818 Encounter for other preprocedural examination: Secondary | ICD-10-CM | POA: Diagnosis not present

## 2022-01-17 DIAGNOSIS — K7581 Nonalcoholic steatohepatitis (NASH): Secondary | ICD-10-CM | POA: Diagnosis present

## 2022-01-17 DIAGNOSIS — Z79891 Long term (current) use of opiate analgesic: Secondary | ICD-10-CM

## 2022-01-17 DIAGNOSIS — K219 Gastro-esophageal reflux disease without esophagitis: Secondary | ICD-10-CM | POA: Diagnosis present

## 2022-01-17 DIAGNOSIS — Z79899 Other long term (current) drug therapy: Secondary | ICD-10-CM

## 2022-01-17 DIAGNOSIS — G4733 Obstructive sleep apnea (adult) (pediatric): Secondary | ICD-10-CM | POA: Diagnosis present

## 2022-01-17 DIAGNOSIS — M1990 Unspecified osteoarthritis, unspecified site: Secondary | ICD-10-CM | POA: Diagnosis present

## 2022-01-17 DIAGNOSIS — Z87442 Personal history of urinary calculi: Secondary | ICD-10-CM

## 2022-01-17 LAB — CBC WITH DIFFERENTIAL/PLATELET
Abs Immature Granulocytes: 0.04 10*3/uL (ref 0.00–0.07)
Basophils Absolute: 0 10*3/uL (ref 0.0–0.1)
Basophils Relative: 1 %
Eosinophils Absolute: 0.1 10*3/uL (ref 0.0–0.5)
Eosinophils Relative: 3 %
HCT: 26.8 % — ABNORMAL LOW (ref 39.0–52.0)
Hemoglobin: 8.6 g/dL — ABNORMAL LOW (ref 13.0–17.0)
Immature Granulocytes: 1 %
Lymphocytes Relative: 12 %
Lymphs Abs: 0.4 10*3/uL — ABNORMAL LOW (ref 0.7–4.0)
MCH: 30.9 pg (ref 26.0–34.0)
MCHC: 32.1 g/dL (ref 30.0–36.0)
MCV: 96.4 fL (ref 80.0–100.0)
Monocytes Absolute: 0.3 10*3/uL (ref 0.1–1.0)
Monocytes Relative: 8 %
Neutro Abs: 2.7 10*3/uL (ref 1.7–7.7)
Neutrophils Relative %: 75 %
Platelets: 77 10*3/uL — ABNORMAL LOW (ref 150–400)
RBC: 2.78 MIL/uL — ABNORMAL LOW (ref 4.22–5.81)
RDW: 16.4 % — ABNORMAL HIGH (ref 11.5–15.5)
WBC: 3.6 10*3/uL — ABNORMAL LOW (ref 4.0–10.5)
nRBC: 0 % (ref 0.0–0.2)

## 2022-01-17 LAB — COMPREHENSIVE METABOLIC PANEL
ALT: 45 U/L — ABNORMAL HIGH (ref 0–44)
AST: 62 U/L — ABNORMAL HIGH (ref 15–41)
Albumin: 2.5 g/dL — ABNORMAL LOW (ref 3.5–5.0)
Alkaline Phosphatase: 186 U/L — ABNORMAL HIGH (ref 38–126)
Anion gap: 9 (ref 5–15)
BUN: 23 mg/dL (ref 8–23)
CO2: 23 mmol/L (ref 22–32)
Calcium: 8.7 mg/dL — ABNORMAL LOW (ref 8.9–10.3)
Chloride: 103 mmol/L (ref 98–111)
Creatinine, Ser: 1.69 mg/dL — ABNORMAL HIGH (ref 0.61–1.24)
GFR, Estimated: 42 mL/min — ABNORMAL LOW (ref >60)
Glucose, Bld: 254 mg/dL — ABNORMAL HIGH (ref 70–99)
Potassium: 3.9 mmol/L (ref 3.5–5.1)
Sodium: 135 mmol/L (ref 135–145)
Total Bilirubin: 0.9 mg/dL (ref 0.3–1.2)
Total Protein: 7.1 g/dL (ref 6.5–8.1)

## 2022-01-17 LAB — PREPARE RBC (CROSSMATCH)

## 2022-01-17 LAB — PROTIME-INR
INR: 1.4 — ABNORMAL HIGH (ref 0.8–1.2)
Prothrombin Time: 16.6 seconds — ABNORMAL HIGH (ref 11.4–15.2)

## 2022-01-17 LAB — APTT: aPTT: 33 seconds (ref 24–36)

## 2022-01-17 LAB — LIPASE, BLOOD: Lipase: 32 U/L (ref 11–51)

## 2022-01-17 LAB — LACTIC ACID, PLASMA: Lactic Acid, Venous: 2.9 mmol/L (ref 0.5–1.9)

## 2022-01-17 MED ORDER — SODIUM CHLORIDE 0.9 % IV SOLN
50.0000 ug/h | INTRAVENOUS | Status: DC
  Administered 2022-01-17 – 2022-01-20 (×7): 50 ug/h via INTRAVENOUS
  Filled 2022-01-17 (×7): qty 1

## 2022-01-17 MED ORDER — METOCLOPRAMIDE HCL 5 MG/ML IJ SOLN
10.0000 mg | Freq: Once | INTRAMUSCULAR | Status: AC
  Administered 2022-01-17: 10 mg via INTRAVENOUS
  Filled 2022-01-17: qty 2

## 2022-01-17 MED ORDER — SODIUM CHLORIDE 0.9 % IV SOLN
1.0000 g | Freq: Once | INTRAVENOUS | Status: AC
  Administered 2022-01-17: 1 g via INTRAVENOUS
  Filled 2022-01-17: qty 10

## 2022-01-17 MED ORDER — OCTREOTIDE LOAD VIA INFUSION
50.0000 ug | Freq: Once | INTRAVENOUS | Status: AC
  Administered 2022-01-17: 50 ug via INTRAVENOUS
  Filled 2022-01-17: qty 25

## 2022-01-17 MED ORDER — SODIUM CHLORIDE 0.9 % IV SOLN
2.0000 g | INTRAVENOUS | Status: DC
  Administered 2022-01-19: 2 g via INTRAVENOUS
  Filled 2022-01-17 (×2): qty 20

## 2022-01-17 MED ORDER — SODIUM CHLORIDE 0.9 % IV SOLN: 10.0000 mL/h | Freq: Once | INTRAVENOUS | Status: DC

## 2022-01-17 MED ORDER — SODIUM CHLORIDE 0.9 % IV SOLN: 1.0000 g | INTRAVENOUS | Status: DC

## 2022-01-17 MED ORDER — INSULIN ASPART 100 UNIT/ML IJ SOLN
0.0000 [IU] | INTRAMUSCULAR | Status: DC
  Administered 2022-01-18 (×2): 1 [IU] via SUBCUTANEOUS
  Administered 2022-01-18: 3 [IU] via SUBCUTANEOUS
  Administered 2022-01-18 (×2): 1 [IU] via SUBCUTANEOUS
  Administered 2022-01-19: 3 [IU] via SUBCUTANEOUS

## 2022-01-17 MED ORDER — PANTOPRAZOLE SODIUM 40 MG IV SOLR: 40.0000 mg | Freq: Two times a day (BID) | INTRAVENOUS | Status: DC

## 2022-01-17 MED ORDER — PANTOPRAZOLE 80MG IVPB - SIMPLE MED
80.0000 mg | Freq: Once | INTRAVENOUS | Status: AC
  Administered 2022-01-17: 80 mg via INTRAVENOUS
  Filled 2022-01-17: qty 100

## 2022-01-17 MED ORDER — PANTOPRAZOLE INFUSION (NEW) - SIMPLE MED
8.0000 mg/h | INTRAVENOUS | Status: DC
  Administered 2022-01-17 – 2022-01-18 (×2): 8 mg/h via INTRAVENOUS
  Filled 2022-01-17 (×5): qty 100

## 2022-01-17 NOTE — Consult Note (Deleted)
NAME:  JOANTHAN HLAVACEK, MRN:  [id number], DOB:  01-Apr-1950, LOS: 0 ADMISSION DATE:  01/17/2022, CONSULTATION DATE:  01/17/22 REFERRING MD:  Maryan Rued , CHIEF COMPLAINT:  hematemesis   History of Present Illness:  AZREAL STTHOMAS, is a 71 y.o. 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 EMS was called. 566m IVF given.  Patient denies hematemesis, BRBPR, melena.  Initial hemoglobin 8.6, [place] 10 [place] PRBCs were [place].  Patient with 2 episodes of hematemesis while in the emergency department.  GI was consulted-patient was started on ceftriaxone, PPI infusion, octreotide.  NG tube was placed.  GI planning to scope a.m. of 10/23.  PCCM was consulted for admission.  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, portal hypertension, hepatic hydrothorax, pancytopenia, DM 2, hypertension, CKD 3-day, COPD, GERD, parkinsons  Significant Hospital Events: Including procedures, antibiotic start and stop dates in addition to other pertinent events   10/22 [facility], PCCM consult, 2 U PRBC  Interim History / Subjective:  See above  Drips: octreotide, protonix  Subjective: Denies chest pain, denies shortness of breath, endorses cough, denies abdominal pain  Objective   Blood pressure (!) 99/51, pulse 98, temperature 99.7 F (37.6 C), temperature source Oral, resp. rate (!) 24, height 5' 6"  (1.676 m), weight 73.9 kg, SpO2 94 %.        Intake/Output Summary (Last 24 hours) at 01/17/2022 2209 Last data filed at 01/17/2022 2039 Gross per 24 hour  Intake 200.32 ml   Output --  Net 200.32 ml   Filed Weights   01/17/22 1857  Weight: 73.9 kg    Examination: General: In bed, NAD, appears comfortable HEENT: MM pink/moist, anicteric, atraumatic Neuro: RASS 0, PERRL 346m GCS 15 CV: S1S2, Afib, no m/r/g appreciated PULM:  clear in the upper lobes, clear in the lower lobes, trachea midline, chest expansion symmetric GI: soft, bsx4 active, non-tender   Extremities: warm/dry, no pretibial edema, capillary refill less than 3 seconds  Skin:  no rashes or lesions noted  Labs/Imaging WBC 3.6 Hemoglobin 8.6 Platelets 77 Glucose 254 Creatinine 1.69 (baseline 1.5-1.89) AST 62, ALT 45, albumin 2.5 Lipase within normal limits INR 1.4 Lactic 2.9 Chest x-ray: Chronic bilateral pleural effusions, no obvious infiltrate, no pneumothorax Twelve-lead: A-fib  Resolved Hospital Problem list     Assessment & Plan:  Hematemesis, suspect secondary to recurrent variceal bleeding versus peptic ulcer disease History of [place] cirrhosis, portal hypertension, esophageal varices, portal gastropathy, GERD Acute blood loss anemia, suspect secondary to above Thrombocytopenia, suspect secondary to above Lactic acidosis secondary to upper GIB Patient with hematemesis on 10/22.  Known history of variceal bleeding.  [facility]  2 units of PRBCs transfusing. -Admit to ICU -Establish 2 large bore PIVs -GI consulted.  Appreciate assistance.  Planning on scoping in a.m..  Will call if patient becomes hemodynamically unstable. -Continue Protonix drip, octreotide drip, ceftriaxone -Transfuse PLT if less than 50K -Check HGB post transfusion and every 4 hours. -Check fibrinogen -Trend lactate -N.p.o.  New onset A-fib Suspect secondary to stress of critical illness, hypokalemia.  Early  rate controlled -Goal mag above 2 -Continuous telemetry -Anticoagulation contraindicated in setting of active bleed  COPD History of  hepatic hydrothorax with bilateral plural effusions on x-ray On room air -monitor -PRN albuterol  HX HTN -hold home antihypertensives  CKD3 -Ensure renal perfusion. Goal MAP 65 or greater. -Avoid neprotoxic drugs as possible. -Strict I&O's -Follow up AM creatinine  DM2 -Blood Glucose goal 140-180. -SSI  Parkinsons  -Resume home medications when able to take PO   Best Practice (right click and "Reselect all SmartList Selections" daily)   Diet/type: NPO DVT prophylaxis: SCD GI prophylaxis: PPI Lines: N/A Foley:  N/A Code Status:  full code Last date of multidisciplinary goals of care discussion [10/22 Full scope]  Labs   CBC: Recent Labs  Lab 01/17/22 1911  WBC 3.6*  NEUTROABS 2.7  HGB 8.6*  HCT 26.8*  MCV 96.4  PLT 77*    Basic Metabolic Panel: Recent Labs  Lab 01/17/22 1911  NA 135  K 3.9  CL 103  CO2 23  GLUCOSE 254*  BUN 23  CREATININE 1.69*  CALCIUM 8.7*   GFR: Estimated Creatinine Clearance: 35.1 mL/min (A) (by C-G formula based on SCr of 1.69 mg/dL (H)). Recent Labs  Lab 01/17/22 1911 01/17/22 2113  WBC 3.6*  --   LATICACIDVEN  --  2.9*    Liver Function Tests: Recent Labs  Lab 01/17/22 1911  AST 62*  ALT 45*  ALKPHOS 186*  BILITOT 0.9  PROT 7.1  ALBUMIN 2.5*   Recent Labs  Lab 01/17/22 1911  LIPASE 32   No results for input(s): "AMMONIA" in the last 168 hours.  ABG    Component Value Date/Time   PHART 7.454 (H) 04/29/2020 1625   PCO2ART 36.7 04/29/2020 1625   PO2ART 376 (H) 04/29/2020 1625   HCO3 25.8 04/29/2020 1625   TCO2 27 04/29/2020 1625   O2SAT 100.0 04/29/2020 1625     Coagulation Profile: Recent Labs  Lab 01/17/22 1911  INR 1.4*    Cardiac Enzymes: No results for input(s): "CKTOTAL", "CKMB", "CKMBINDEX", "TROPONINI" in the last 168 hours.  HbA1C: Hgb A1c MFr Bld  Date/Time Value Ref Range Status  02/26/2021 01:20 PM 6.8 (H) 4.8 - 5.6 % Final    Comment:    (NOTE) Pre diabetes:           5.7%-6.4%  Diabetes:              >6.4%  Glycemic control for   <7.0% adults with diabetes   10/15/2020 04:47 AM 7.2 (H) 4.8 - 5.6 % Final    Comment:    (NOTE) Pre diabetes:          5.7%-6.4%  Diabetes:              >6.4%  Glycemic control for   <7.0% adults with diabetes     CBG: No results for input(s): "GLUCAP" in the last 168 hours.  Review of Systems:   Positives in bold  Gen: fever, chills, weight change, fatigue, night sweats HEENT:  blurred vision, double vision, hearing loss, tinnitus, sinus congestion, rhinorrhea, sore throat, neck stiffness, dysphagia PULM:  shortness of breath, cough, sputum production, hemoptysis, wheezing CV: chest pain, edema, orthopnea, paroxysmal nocturnal dyspnea, palpitations GI:  abdominal pain, nausea, vomiting, diarrhea, hematochezia, melena, constipation, change in bowel habits GU: dysuria, hematuria, polyuria, oliguria, urethral discharge Endocrine: hot or cold intolerance, polyuria, polyphagia or appetite change Derm: rash, dry skin, scaling or peeling skin change Heme: easy bruising, bleeding, bleeding  gums Neuro: headache, numbness, weakness, slurred speech, loss of memory or consciousness   Past Medical History:  He,  has a past medical history of Allergy, Anxiety, Aortic valve disorder, Arthritis, Asthma, Cataract, Cirrhosis ([place]), CKD (chronic kidney disease), stage III ([place]), COPD (chronic obstructive pulmonary disease) ([place]), Depression, DM (diabetes mellitus) ([place]), Dyspnea, GERD (gastroesophageal reflux disease), Heart murmur, History of colon polyps, History of COVID-19 (10/14/2020), History of kidney stones, acute pancreatitis (10/2019), Hyperlipidemia, Hypertension, Hypothyroidism, Myelodysplastic syndrome ([place]), Neuromuscular disorder ([place]), Obstructive sleep apnea, Oxygen deficiency, Parkinsonism, Peripheral edema, Peripheral positional vertigo, Sleep apnea, and Stroke ([place]).   Surgical History:   Past Surgical  History:  Procedure Laterality Date   ANKLE SURGERY     CHOLECYSTECTOMY     ENDARTERECTOMY Left 04/29/2020   Procedure: CAROTID EXPLORATION removal of  central venous catheter;  Surgeon: Rosetta Posner, MD;  Location: [place];  Service: Vascular;  Laterality: Left;   ESOPHAGEAL BANDING N/A 04/17/2020   Procedure: ESOPHAGEAL BANDING;  Surgeon: Yetta Flock, MD;  Location: [facility];  Service: Gastroenterology;  Laterality: N/A;   ESOPHAGEAL BANDING  04/29/2020   Procedure: ESOPHAGEAL BANDING;  Surgeon: Rush Landmark Telford Nab., MD;  Location: [place];  Service: Gastroenterology;;   ESOPHAGEAL BANDING N/A 06/10/2020   Procedure: ESOPHAGEAL BANDING;  Surgeon: Yetta Flock, MD;  Location: [facility];  Service: Gastroenterology;  Laterality: N/A;   ESOPHAGEAL BANDING N/A 11/10/2020   Procedure: ESOPHAGEAL BANDING;  Surgeon: Yetta Flock, MD;  Location: [facility];  Service: Gastroenterology;  Laterality: N/A;   ESOPHAGEAL BANDING N/A 02/10/2021   Procedure: ESOPHAGEAL BANDING;  Surgeon: Jerene Bears, MD;  [facility]: [facility];  Service: Gastroenterology;  Laterality: N/A;   ESOPHAGEAL BANDING N/A 04/21/2021   Procedure: ESOPHAGEAL BANDING;  Surgeon: Milus Banister, MD;  Location: [facility];  Service: Endoscopy;  Laterality: N/A;   ESOPHAGEAL BANDING N/A 07/16/2021   Procedure: ESOPHAGEAL BANDING;  Surgeon: Ladene Artist, MD;  [facility]: [facility];  Service: Gastroenterology;  Laterality: N/A;   ESOPHAGOGASTRODUODENOSCOPY N/A 04/25/2020   Procedure: ESOPHAGOGASTRODUODENOSCOPY (EGD);  Surgeon: Carol Ada, MD;  [facility];  Service: Endoscopy;  Laterality: N/A;   ESOPHAGOGASTRODUODENOSCOPY (EGD) WITH PROPOFOL N/A 04/17/2020   Procedure: ESOPHAGOGASTRODUODENOSCOPY (EGD) WITH PROPOFOL;  Surgeon: Yetta Flock, MD;  [facility];  Service: Gastroenterology;  Laterality: N/A;   ESOPHAGOGASTRODUODENOSCOPY (EGD) WITH PROPOFOL N/A 04/29/2020    Procedure: ESOPHAGOGASTRODUODENOSCOPY (EGD) WITH PROPOFOL;  Surgeon: Rush Landmark Telford Nab., MD;  Location: [place];  Service: Gastroenterology;  Laterality: N/A;   ESOPHAGOGASTRODUODENOSCOPY (EGD) WITH PROPOFOL N/A 06/10/2020   Procedure: ESOPHAGOGASTRODUODENOSCOPY (EGD) WITH PROPOFOL;  Surgeon: Yetta Flock, MD;  [facility];  Service: Gastroenterology;  Laterality: N/A;   ESOPHAGOGASTRODUODENOSCOPY (EGD) WITH PROPOFOL N/A 11/10/2020   Procedure: ESOPHAGOGASTRODUODENOSCOPY (EGD) WITH PROPOFOL;  Surgeon: Yetta Flock, MD;  [facility];  Service: Gastroenterology;  Laterality: N/A;   ESOPHAGOGASTRODUODENOSCOPY (EGD) WITH PROPOFOL N/A 02/10/2021   Procedure: ESOPHAGOGASTRODUODENOSCOPY (EGD) WITH PROPOFOL;  Surgeon: Jerene Bears, MD;  [facility];  Service: Gastroenterology;  Laterality: N/A;   ESOPHAGOGASTRODUODENOSCOPY (EGD) WITH PROPOFOL N/A 03/31/2021   Procedure: ESOPHAGOGASTRODUODENOSCOPY (EGD) WITH PROPOFOL;  Surgeon: Yetta Flock, MD;  [facility];  Service: Gastroenterology;  Laterality: N/A;   ESOPHAGOGASTRODUODENOSCOPY (EGD) WITH PROPOFOL N/A 04/21/2021   Procedure: ESOPHAGOGASTRODUODENOSCOPY (EGD) WITH PROPOFOL;  Surgeon: Milus Banister, MD;  [facility];  Service: Endoscopy;  Laterality: N/A;   ESOPHAGOGASTRODUODENOSCOPY (EGD) WITH PROPOFOL N/A 07/16/2021   Procedure: ESOPHAGOGASTRODUODENOSCOPY (EGD)  WITH PROPOFOL;  Surgeon: Ladene Artist, MD;  [facility];  Service: Gastroenterology;  Laterality: N/A;   ESOPHAGOGASTRODUODENOSCOPY (EGD) WITH PROPOFOL N/A 10/15/2021   Procedure: ESOPHAGOGASTRODUODENOSCOPY (EGD) WITH PROPOFOL;  Surgeon: Yetta Flock, MD;  [facility];  Service: Gastroenterology;  Laterality: N/A;   EYE SURGERY     HERNIA REPAIR     IR PARACENTESIS  09/18/2020   IR RADIOLOGIST EVAL & MGMT  10/22/2021   IR THORACENTESIS ASP PLEURAL SPACE W/IMG GUIDE  10/05/2021    KIDNEY STONE SURGERY     WISDOM TOOTH EXTRACTION       Social History:   reports that he has never smoked. He has never been exposed to tobacco smoke. He has never used smokeless tobacco. He reports that he does not currently use alcohol. He reports that he does not currently use drugs.   Family History:  His family history includes Liver cancer in his mother. There is no history of Colon cancer, Stomach cancer, Colon polyps, Esophageal cancer, or Rectal cancer.   Allergies Allergies  Allergen Reactions   Lisinopril Cough     Home Medications  Prior to Admission medications   Medication Sig Start Date End Date Taking? Authorizing Provider  acetaminophen (TYLENOL) 325 MG tablet Take 325 mg by mouth at bedtime.    [provider]  albuterol (VENTOLIN HFA) 108 (90 Base) MCG/ACT inhaler Inhale 1-2 puffs into the lungs every 6 (six) hours as needed for wheezing or shortness of breath.    [provider]  ARTIFICIAL SALIVA MT Use as directed 1 spray in the mouth or throat as needed (Dry mouth). MOUTH COAT 10/26/20   [provider]  atorvastatin (LIPITOR) 40 MG tablet Take 40 mg by mouth daily.    [provider]  buPROPion (WELLBUTRIN XL) 150 MG 24 hr tablet Take 150 mg by mouth daily.    [provider]  carbidopa-levodopa (SINEMET IR) 25-100 MG tablet Take 1 tablet by mouth in the morning, at noon, and at bedtime. 06/30/21   [provider]  carboxymethylcellulose (REFRESH TEARS) 0.5 % SOLN Place 1 drop into both eyes 3 (three) times daily as needed (dry eyes).    [provider]  citalopram (CELEXA) 40 MG tablet Take 40 mg by mouth daily.    [provider]  ferrous sulfate 325 (65 FE) MG tablet Take 325 mg by mouth every Monday, Wednesday, and Friday.    [provider]  fluticasone (FLONASE) 50 MCG/ACT nasal spray Place 1 spray into both nostrils at bedtime.    [provider]  furosemide (LASIX) 20 MG  tablet Take 20 mg by mouth daily.    [provider]  guaiFENesin (DIABETIC TUSSIN EX) 100 MG/5ML liquid Take 10 mLs by mouth every 4 (four) hours as needed for cough or to loosen phlegm.    [provider]  guaiFENesin-codeine (ROBITUSSIN AC) 100-10 MG/5ML syrup Take 5 mLs by mouth at bedtime as needed for cough. Patient not taking: Reported on 12/02/2021 05/06/21   [provider]  insulin glargine (LANTUS) 100 unit/mL SOPN Inject 16 Units into the skin daily.    [provider]  ipratropium (ATROVENT) 0.03 % nasal spray Place 1 spray into both nostrils at bedtime.    [provider]  lactulose (CHRONULAC) 10 GM/15ML solution Take 15 mLs (10 g total) by mouth 3 (three) times daily as needed for mild constipation. 10/21/21   Armbruster, Carlota Raspberry, MD  latanoprost (XALATAN) 0.005 %  ophthalmic solution Place 1 drop into both eyes at bedtime. 08/20/20   [provider]  lidocaine (LIDODERM) 5 % Place 1 patch onto the skin daily as needed (pain). Remove & Discard patch within 12 hours or as directed by MD    [provider]  loratadine (CLARITIN) 10 MG tablet Take 10 mg by mouth daily. 09/04/20   [provider]  meclizine (ANTIVERT) 25 MG tablet Take 25 mg by mouth 2 (two) times daily as needed for dizziness.    [provider]  midodrine (PROAMATINE) 10 MG tablet Take 1 tablet (10 mg total) by mouth 3 (three) times daily. 10/21/21   Armbruster, Carlota Raspberry, MD  montelukast (SINGULAIR) 10 MG tablet Take 10 mg by mouth daily. 05/22/20   [provider]  Multiple Vitamin (MULTIVITAMIN WITH MINERALS) TABS tablet Take 1 tablet by mouth daily.    [provider]  pantoprazole (PROTONIX) 40 MG tablet Take 40 mg by mouth daily with breakfast.    [provider]  spironolactone (ALDACTONE) 100 MG tablet Take 50 mg by mouth every other day.    [provider]  tamsulosin (FLOMAX) 0.4 MG CAPS capsule Take 0.4  mg by mouth 2 (two) times daily.    [provider]  traZODone (DESYREL) 150 MG tablet Take 75 mg by mouth at bedtime as needed for sleep. 09/28/19   [provider]     Critical care time: 39 minutes    The patient is critically ill with multiple organ systems failure and requires high complexity decision making for assessment and support, frequent evaluation and titration of therapies, application of advanced monitoring technologies and extensive interpretation of multiple databases.    Critical Care Time devoted to patient care services described in this note is 39 minutes. This time reflects time of care of this [place] NP. This critical care time does not reflect procedure time but could involve care discussion time with the PCCM attending.  Redmond School., MSN, APRN, AGACNP-BC [facility]  01/17/2022 , 11:20 PM  Please see [email or web address] for pager details  If no response, please call [phone] After hours, please call Elink at [phone]

## 2022-01-17 NOTE — Consult Note (Addendum)
Consultation  Referring Provider:      Primary Care Physician:  [facility], [facility] Primary Gastroenterologist:         Reason for Consultation:          Impression / Plan:   Hematemesis in the setting of cirrhosis and history of portal hypertension with esophageal varices and portal gastropathy, suspect recurrent esophageal variceal bleeding.  Peptic ulcer disease also possible as is bleeding from portal hypertensive gastropathy.  History of hepatic hydrothorax  Acute blood loss anemia  MELD score 17  Solitary functioning kidney (right)  ----------------------------------------------------------------------------------------------------------------------------  Agree with care so far, IV fluids, ceftriaxone, PPI infusion octreotide and transfuse.  After volume and blood resuscitation barring other problems would anticipate EGD tomorrow morning.  Agree with ICU evaluation.  Would aim for a maximum hemoglobin of 10 approximately.  Abdominal ultrasound with Doppler of portal vessels pending EGD  2 view chest x-ray to evaluate pleural effusions further when medical status allows  Patient had been considering TIPS.  Depending upon clinical course consider evaluation while here.  He would need an updated echocardiogram  Gatha Mayer, MD, [facility] See [name] on call - gastroenterology for best contact person 01/17/2022 9:36 PM         HPI:   Corey Wood is a 71 y.o. 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  He had presented in 2022 with an acute GI bleed after banding procedures, he has had subsequently numerous banding procedures over the past couple of years at the direction of Dr. Havery Moros as he is intolerant of beta-blockade.  April and July of  this year no bands were needed to be placed.  He was doing reasonably well but started to have some coughing yesterday and today and then had the onset of several episodes of hematemesis and presented to the emergency department.  He has had an NG tube placed and is draining dark red blood.  It sounds like he vomited at home in the ambulance and here in the ER.  He is not having any pain.  He has not been on any anti-inflammatories or NSAIDs.  The cough he was having was dry.  He does have a history of recurrent hepatic hydrothorax and has undergone thoracenteses in the past.  TIPS has been discussed at various times though not acted on.  More recently he saw [name], NP on 01/12/2022 in the [facility] here in [place] and the patient was more open to the idea of TIPS.  Per family hemoglobin was recently around 10 when he was seen in hematology at the [facility].  Tonight it is 8.6.   Last EGD 10/15/2021  - Esophagogastric landmarks identified. - Grade I esophageal varices with flattening - extensive scarring from prior banding, improved. No further banding performed today - Portal hypertensive gastropathy. - Normal duodenal bulb and second portion of the duodenum.  Past Medical History:  Diagnosis Date   Allergy    seasonal allergies   Anxiety    on meds   Aortic valve disorder    Arthritis    generalized (fingers)(shoulders)   Asthma    uses inhaler   Cataract    bilateral -sx    Cirrhosis (HCC)    CKD (chronic kidney disease), stage III ([place])    only  has one kidney   COPD (chronic obstructive pulmonary disease) (HCC)    Depression    on meds   DM (diabetes mellitus) ([place])    on meds   Dyspnea    GERD (gastroesophageal reflux disease)    on meds   Heart murmur    History of colon polyps    History of COVID-19 10/14/2020   History of kidney stones    Hx of acute pancreatitis 10/2019   Hyperlipidemia    on meds   Hypertension    on meds   Hypothyroidism    not on  meds at this time   Myelodysplastic syndrome ([place])    Neuromuscular disorder ([place])    per pt   Obstructive sleep apnea    Oxygen deficiency    Parkinsonism    per pt report   Peripheral edema    bilateral legs   Peripheral positional vertigo    Sleep apnea    No CPAP   Stroke [facility])     Past Surgical History:  Procedure Laterality Date   ANKLE SURGERY     CHOLECYSTECTOMY     ENDARTERECTOMY Left 04/29/2020   Procedure: CAROTID EXPLORATION removal of  central venous catheter;  Surgeon: Rosetta Posner, MD;  Location: [facility] OR;  Service: Vascular;  Laterality: Left;   ESOPHAGEAL BANDING N/A 04/17/2020   Procedure: ESOPHAGEAL BANDING;  Surgeon: Yetta Flock, MD;  Location: [facility];  Service: Gastroenterology;  Laterality: N/A;   ESOPHAGEAL BANDING  04/29/2020   Procedure: ESOPHAGEAL BANDING;  Surgeon: Rush Landmark Telford Nab., MD;  Location: [place];  Service: Gastroenterology;;   ESOPHAGEAL BANDING N/A 06/10/2020   Procedure: ESOPHAGEAL BANDING;  Surgeon: Yetta Flock, MD;  Location: [facility];  Service: Gastroenterology;  Laterality: N/A;   ESOPHAGEAL BANDING N/A 11/10/2020   Procedure: ESOPHAGEAL BANDING;  Surgeon: Yetta Flock, MD;  Location: [facility];  Service: Gastroenterology;  Laterality: N/A;   ESOPHAGEAL BANDING N/A 02/10/2021   Procedure: ESOPHAGEAL BANDING;  Surgeon: Jerene Bears, MD;  Location: [facility];  Service: Gastroenterology;  Laterality: N/A;   ESOPHAGEAL BANDING N/A 04/21/2021   Procedure: ESOPHAGEAL BANDING;  Surgeon: Milus Banister, MD;  Location: [facility];  Service: Endoscopy;  Laterality: N/A;   ESOPHAGEAL BANDING N/A 07/16/2021   Procedure: ESOPHAGEAL BANDING;  Surgeon: Ladene Artist, MD;  Location: [facility];  Service: Gastroenterology;  Laterality: N/A;   ESOPHAGOGASTRODUODENOSCOPY N/A 04/25/2020   Procedure: ESOPHAGOGASTRODUODENOSCOPY (EGD);  Surgeon: Carol Ada, MD;  [facility];  Service: Endoscopy;   Laterality: N/A;   ESOPHAGOGASTRODUODENOSCOPY (EGD) WITH PROPOFOL N/A 04/17/2020   Procedure: ESOPHAGOGASTRODUODENOSCOPY (EGD) WITH PROPOFOL;  Surgeon: Yetta Flock, MD;  Location: [facility];  Service: Gastroenterology;  Laterality: N/A;   ESOPHAGOGASTRODUODENOSCOPY (EGD) WITH PROPOFOL N/A 04/29/2020   Procedure: ESOPHAGOGASTRODUODENOSCOPY (EGD) WITH PROPOFOL;  Surgeon: Rush Landmark Telford Nab., MD;  Location: [place];  Service: Gastroenterology;  Laterality: N/A;   ESOPHAGOGASTRODUODENOSCOPY (EGD) WITH PROPOFOL N/A 06/10/2020   Procedure: ESOPHAGOGASTRODUODENOSCOPY (EGD) WITH PROPOFOL;  Surgeon: Yetta Flock, MD;  [facility]: [facility];  Service: Gastroenterology;  Laterality: N/A;   ESOPHAGOGASTRODUODENOSCOPY (EGD) WITH PROPOFOL N/A 11/10/2020   Procedure: ESOPHAGOGASTRODUODENOSCOPY (EGD) WITH PROPOFOL;  Surgeon: Yetta Flock, MD;  [facility]: [facility];  Service: Gastroenterology;  Laterality: N/A;   ESOPHAGOGASTRODUODENOSCOPY (EGD) WITH PROPOFOL N/A 02/10/2021   Procedure: ESOPHAGOGASTRODUODENOSCOPY (EGD) WITH PROPOFOL;  Surgeon: Jerene Bears, MD;  Location: [facility];  Service: Gastroenterology;  Laterality: N/A;   ESOPHAGOGASTRODUODENOSCOPY (EGD) WITH PROPOFOL N/A 03/31/2021  Procedure: ESOPHAGOGASTRODUODENOSCOPY (EGD) WITH PROPOFOL;  Surgeon: Yetta Flock, MD;  [facility]: [facility];  Service: Gastroenterology;  Laterality: N/A;   ESOPHAGOGASTRODUODENOSCOPY (EGD) WITH PROPOFOL N/A 04/21/2021   Procedure: ESOPHAGOGASTRODUODENOSCOPY (EGD) WITH PROPOFOL;  Surgeon: Milus Banister, MD;  [facility]: [facility];  Service: Endoscopy;  Laterality: N/A;   ESOPHAGOGASTRODUODENOSCOPY (EGD) WITH PROPOFOL N/A 07/16/2021   Procedure: ESOPHAGOGASTRODUODENOSCOPY (EGD) WITH PROPOFOL;  Surgeon: Ladene Artist, MD;  Location: [facility];  Service: Gastroenterology;  Laterality: N/A;   ESOPHAGOGASTRODUODENOSCOPY (EGD) WITH PROPOFOL N/A 10/15/2021   Procedure:  ESOPHAGOGASTRODUODENOSCOPY (EGD) WITH PROPOFOL;  Surgeon: Yetta Flock, MD;  Location: [facility];  Service: Gastroenterology;  Laterality: N/A;   EYE SURGERY     HERNIA REPAIR     IR PARACENTESIS  09/18/2020   IR RADIOLOGIST EVAL & MGMT  10/22/2021   IR THORACENTESIS ASP PLEURAL SPACE W/IMG GUIDE  10/05/2021   KIDNEY STONE SURGERY     WISDOM TOOTH EXTRACTION      Family History  Problem Relation Age of Onset   Liver cancer Mother    Colon cancer Neg Hx    Stomach cancer Neg Hx    Colon polyps Neg Hx    Esophageal cancer Neg Hx    Rectal cancer Neg Hx     Social History   Tobacco Use   Smoking status: Never    Passive exposure: Never   Smokeless tobacco: Never  Vaping Use   Vaping Use: Never used  Substance Use Topics   Alcohol use: Not Currently   Drug use: Not Currently    Prior to Admission medications   Medication Sig Start Date End Date Taking? Authorizing Provider  acetaminophen (TYLENOL) 325 MG tablet Take 325 mg by mouth at bedtime.    [provider]  albuterol (VENTOLIN HFA) 108 (90 Base) MCG/ACT inhaler Inhale 1-2 puffs into the lungs every 6 (six) hours as needed for wheezing or shortness of breath.    [provider]  ARTIFICIAL SALIVA MT Use as directed 1 spray in the mouth or throat as needed (Dry mouth). MOUTH COAT 10/26/20   [provider]  atorvastatin (LIPITOR) 40 MG tablet Take 40 mg by mouth daily.    [provider]  buPROPion (WELLBUTRIN XL) 150 MG 24 hr tablet Take 150 mg by mouth daily.    [provider]  carbidopa-levodopa (SINEMET IR) 25-100 MG tablet Take 1 tablet by mouth in the morning, at noon, and at bedtime. 06/30/21   [provider]  carboxymethylcellulose (REFRESH TEARS) 0.5 % SOLN Place 1 drop into both eyes 3 (three) times daily as needed (dry eyes).    [provider]  citalopram (CELEXA) 40 MG tablet Take 40 mg by mouth daily.    [provider]  ferrous  sulfate 325 (65 FE) MG tablet Take 325 mg by mouth every Monday, Wednesday, and Friday.    [provider]  fluticasone (FLONASE) 50 MCG/ACT nasal spray Place 1 spray into both nostrils at bedtime.    [provider]  furosemide (LASIX) 20 MG tablet Take 20 mg by mouth daily.    [provider]  guaiFENesin (DIABETIC TUSSIN EX) 100 MG/5ML liquid Take 10 mLs by mouth every 4 (four) hours as needed for cough or to loosen phlegm.    [provider]  guaiFENesin-codeine (ROBITUSSIN AC) 100-10 MG/5ML syrup Take 5 mLs by mouth at bedtime as needed for cough. Patient not taking: Reported on 12/02/2021 05/06/21   [provider]  insulin glargine (LANTUS) 100 unit/mL SOPN Inject 16 Units into the skin daily.    [provider]  ipratropium (ATROVENT) 0.03 % nasal spray Place 1 spray into both nostrils at bedtime.    [provider]  lactulose (CHRONULAC) 10 GM/15ML solution Take 15 mLs (10 g total) by mouth 3 (three) times daily as needed for mild constipation. 10/21/21   Armbruster, Carlota Raspberry, MD  latanoprost (XALATAN) 0.005 % ophthalmic solution Place 1 drop into both eyes at bedtime. 08/20/20   [provider]  lidocaine (LIDODERM) 5 % Place 1 patch onto the skin daily as needed (pain). Remove & Discard patch within 12 hours or as directed by MD    [provider]  loratadine (CLARITIN) 10 MG tablet Take 10 mg by mouth daily. 09/04/20   [provider]  meclizine (ANTIVERT) 25 MG tablet Take 25 mg by mouth 2 (two) times daily as needed for dizziness.    [provider]  midodrine (PROAMATINE) 10 MG tablet Take 1 tablet (10 mg total) by mouth 3 (three) times daily. 10/21/21   Armbruster, Carlota Raspberry, MD  montelukast (SINGULAIR) 10 MG tablet Take 10 mg by mouth daily. 05/22/20   [provider]  Multiple Vitamin (MULTIVITAMIN WITH MINERALS) TABS tablet Take 1 tablet by mouth daily.    [provider]   pantoprazole (PROTONIX) 40 MG tablet Take 40 mg by mouth daily with breakfast.    [provider]  spironolactone (ALDACTONE) 100 MG tablet Take 50 mg by mouth every other day.    [provider]  tamsulosin (FLOMAX) 0.4 MG CAPS capsule Take 0.4 mg by mouth 2 (two) times daily.    [provider]  traZODone (DESYREL) 150 MG tablet Take 75 mg by mouth at bedtime as needed for sleep. 09/28/19   [provider]    Current Facility-Administered Medications  Medication Dose Route Frequency Provider Last Rate Last Admin   0.9 %  sodium chloride infusion  10 mL/hr Intravenous Once Blanchie Dessert, MD       octreotide (SANDOSTATIN) 500 mcg in sodium chloride 0.9 % 250 mL (2 mcg/mL) infusion  50 mcg/hr Intravenous Continuous Blanchie Dessert, MD 25 mL/hr at 01/17/22 2011 50 mcg/hr at 01/17/22 2011   [START ON 01/21/2022] pantoprazole (PROTONIX) injection 40 mg  40 mg Intravenous Q12H Plunkett, Loree Fee, MD       pantoprozole (PROTONIX) 80 mg /NS 100 mL infusion  8 mg/hr Intravenous Continuous Blanchie Dessert, MD 10 mL/hr at 01/17/22 2037 8 mg/hr at 01/17/22 2037   Current Outpatient Medications  Medication Sig Dispense Refill   acetaminophen (TYLENOL) 325 MG tablet Take 325 mg by mouth at bedtime.     albuterol (VENTOLIN HFA) 108 (90 Base) MCG/ACT inhaler Inhale 1-2 puffs into the lungs every 6 (six) hours as needed for wheezing or shortness of breath.     ARTIFICIAL SALIVA MT Use as directed 1 spray in the mouth or throat as needed (Dry mouth). MOUTH COAT     atorvastatin (LIPITOR) 40 MG tablet Take 40 mg by mouth daily.     buPROPion (WELLBUTRIN XL) 150 MG 24 hr tablet Take 150 mg by mouth daily.     carbidopa-levodopa (SINEMET IR) 25-100 MG tablet Take 1 tablet by mouth in the morning, at noon, and at bedtime.     carboxymethylcellulose (REFRESH TEARS) 0.5 % SOLN Place 1 drop into both eyes 3 (three) times daily as needed (dry eyes).     citalopram (CELEXA) 40  MG tablet Take 40 mg by mouth daily.     ferrous sulfate 325 (65 FE) MG tablet Take 325 mg by mouth every Monday, Wednesday, and Friday.     fluticasone (FLONASE) 50 MCG/ACT nasal spray Place 1 spray into both nostrils at bedtime.     furosemide (LASIX) 20 MG tablet Take 20 mg by mouth daily.     guaiFENesin (DIABETIC TUSSIN EX) 100 MG/5ML liquid Take 10 mLs by mouth every 4 (four) hours as needed for cough or to loosen phlegm.     guaiFENesin-codeine (ROBITUSSIN AC) 100-10 MG/5ML syrup Take 5 mLs by mouth at bedtime as needed for cough. (Patient not taking: Reported on 12/02/2021)     insulin glargine (LANTUS) 100 unit/mL SOPN Inject 16 Units into the skin daily.     ipratropium (ATROVENT) 0.03 % nasal spray Place 1 spray into both nostrils at bedtime.     lactulose (CHRONULAC) 10 GM/15ML solution Take 15 mLs (10 g total) by mouth 3 (three) times daily as needed for mild constipation. 1350 mL 2   latanoprost (XALATAN) 0.005 % ophthalmic solution Place 1 drop into both eyes at bedtime.     lidocaine (LIDODERM) 5 % Place 1 patch onto the skin daily as needed (pain). Remove & Discard patch within 12 hours or as directed by MD     loratadine (CLARITIN) 10 MG tablet Take 10 mg by mouth daily.     meclizine (ANTIVERT) 25 MG tablet Take 25 mg by mouth 2 (two) times daily as needed for dizziness.     midodrine (PROAMATINE) 10 MG tablet Take 1 tablet (10 mg total) by mouth 3 (three) times daily.     montelukast (SINGULAIR) 10 MG tablet Take 10 mg by mouth daily.     Multiple Vitamin (MULTIVITAMIN WITH MINERALS) TABS tablet Take 1 tablet by mouth daily.     pantoprazole (PROTONIX) 40 MG tablet Take 40 mg by mouth daily with breakfast.     spironolactone (ALDACTONE) 100 MG tablet Take 50 mg by mouth every other day.     tamsulosin (FLOMAX) 0.4 MG CAPS capsule Take 0.4 mg by mouth 2 (two) times daily.     traZODone (DESYREL) 150 MG tablet Take 75 mg by mouth at bedtime as needed for sleep.      Allergies as  of 01/17/2022 - Review Complete 01/17/2022  Allergen Reaction Noted   Lisinopril Cough 07/12/2012     Review of Systems:     All other review of systems are negative.       Physical Exam:  Vital signs in last 24 hours: Temp:  [98.7 F (37.1 C)-99.1 F (37.3 C)] 99.1 F (37.3 C) (10/22 2100) Pulse Rate:  [103-125] 103 (10/22 2100) Resp:  [15-27] 19 (10/22 2100) BP: (92-112)/(65-73) 99/66 (10/22 2100) SpO2:  [93 %-95 %] 94 % (10/22 2100) Weight:  [73.9 kg] 73.9 kg (10/22 1857)    General:  Acutely and chronically ill white man with NG tube in draining dark red blood Eyes:  anicteric.  Lungs: Crackles and decreased breath sounds right base otherwise clear Heart:   S1S2, no rubs, murmurs, gallops. Abdomen: Protuberant and somewhat distended, non-tender, BS+.  Extremities:   no edema Skin   no rash. Neuro:  A&O x 3.,  No asterixis Psych:  appropriate mood and  Affect.   Data Reviewed:   LAB RESULTS: Recent Labs    01/17/22 1911  WBC 3.6*  HGB 8.6*  HCT 26.8*  PLT 77*   BMET  Recent Labs    01/17/22 1911  NA 135  K 3.9  CL 103  CO2 23  GLUCOSE 254*  BUN 23  CREATININE 1.69*  CALCIUM 8.7*   LFT Recent Labs    01/17/22 1911  PROT 7.1  ALBUMIN 2.5*  AST 62*  ALT 45*  ALKPHOS 186*  BILITOT 0.9   PT/INR Recent Labs    01/17/22 1911  LABPROT 16.6*  INR 1.4*    STUDIES: DG Abdomen 1 View  Result Date: 01/17/2022 CLINICAL DATA:  Nasogastric tube placement EXAM: ABDOMEN - 1 VIEW COMPARISON:  None Available. FINDINGS: Nasogastric tube tip overlies the mid body of the stomach. There is moderate gaseous distension of the visualized stomach. Moderate left pleural effusion is present. Small right pleural effusion. IMPRESSION: Nasogastric tube tip within the mid body of the stomach. Electronically Signed   By: Fidela Salisbury M.D.   On: 01/17/2022 20:45   DG Chest Port 1 View  Result Date: 01/17/2022 CLINICAL DATA:  Hematemesis EXAM: PORTABLE CHEST  1 VIEW COMPARISON:  12/08/2021 FINDINGS: Cardiac enlargement. No vascular congestion. Small right and moderate left pleural effusions with basilar atelectasis or consolidation. Appearances are similar to prior study. No pneumothorax. IMPRESSION: Chronic bilateral pleural effusions with basilar atelectasis or consolidation, greater on the left. No significant change since prior study. Electronically Signed   By: Lucienne Capers M.D.   On: 01/17/2022 19:27        Thanks   LOS: 0 days   @Shaunae Sieloff  Simonne Maffucci, MD, [facility] @  01/17/2022, 9:21 PM

## 2022-01-17 NOTE — H&P (View-Only) (Signed)
Consultation  Referring Provider:      Primary Care Physician:  [facility], [facility] Primary Gastroenterologist:         Reason for Consultation:          Impression / Plan:   Hematemesis in the setting of cirrhosis and history of portal hypertension with esophageal varices and portal gastropathy, suspect recurrent esophageal variceal bleeding.  Peptic ulcer disease also possible as is bleeding from portal hypertensive gastropathy.  History of hepatic hydrothorax  Acute blood loss anemia  MELD score 17  Solitary functioning kidney (right)  ----------------------------------------------------------------------------------------------------------------------------  Agree with care so far, IV fluids, ceftriaxone, PPI infusion octreotide and transfuse.  After volume and blood resuscitation barring other problems would anticipate EGD tomorrow morning.  Agree with ICU evaluation.  Would aim for a maximum hemoglobin of 10 approximately.  Abdominal ultrasound with Doppler of portal vessels pending EGD  2 view chest x-ray to evaluate pleural effusions further when medical status allows  Patient had been considering TIPS.  Depending upon clinical course consider evaluation while here.  He would need an updated echocardiogram  Gatha Mayer, MD, [facility] See [name] on call - gastroenterology for best contact person 01/17/2022 9:36 PM         HPI:   Corey Wood is a 71 y.o. 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  He had presented in 2022 with an acute GI bleed after banding procedures, he has had subsequently numerous banding procedures over the past couple of years at the direction of Dr. Havery Moros as he is intolerant of beta-blockade.  April and July of  this year no bands were needed to be placed.  He was doing reasonably well but started to have some coughing yesterday and today and then had the onset of several episodes of hematemesis and presented to the emergency department.  He has had an NG tube placed and is draining dark red blood.  It sounds like he vomited at home in the ambulance and here in the ER.  He is not having any pain.  He has not been on any anti-inflammatories or NSAIDs.  The cough he was having was dry.  He does have a history of recurrent hepatic hydrothorax and has undergone thoracenteses in the past.  TIPS has been discussed at various times though not acted on.  More recently he saw [name], NP on 01/12/2022 in the [facility] clinic here in [place] and the patient was more open to the idea of TIPS.  Per family hemoglobin was recently around 10 when he was seen in hematology at the [facility].  Tonight it is 8.6.   Last EGD 10/15/2021  - Esophagogastric landmarks identified. - Grade I esophageal varices with flattening - extensive scarring from prior banding, improved. No further banding performed today - Portal hypertensive gastropathy. - Normal duodenal bulb and second portion of the duodenum.  Past Medical History:  Diagnosis Date   Allergy    seasonal allergies   Anxiety    on meds   Aortic valve disorder    Arthritis    generalized (fingers)(shoulders)   Asthma    uses inhaler   Cataract    bilateral -sx    Cirrhosis (HCC)    CKD (chronic kidney disease), stage III ([place])    only  has one kidney   COPD (chronic obstructive pulmonary disease) (HCC)    Depression    on meds   DM (diabetes mellitus) ([place])    on meds   Dyspnea    GERD (gastroesophageal reflux disease)    on meds   Heart murmur    History of colon polyps    History of COVID-19 10/14/2020   History of kidney stones    Hx of acute pancreatitis 10/2019   Hyperlipidemia    on meds   Hypertension    on meds   Hypothyroidism    not on  meds at this time   Myelodysplastic syndrome ([place])    Neuromuscular disorder ([place])    per pt   Obstructive sleep apnea    Oxygen deficiency    Parkinsonism    per pt report   Peripheral edema    bilateral legs   Peripheral positional vertigo    Sleep apnea    No CPAP   Stroke [facility])     Past Surgical History:  Procedure Laterality Date   ANKLE SURGERY     CHOLECYSTECTOMY     ENDARTERECTOMY Left 04/29/2020   Procedure: CAROTID EXPLORATION removal of  central venous catheter;  Surgeon: Rosetta Posner, MD;  Location: [facility] OR;  Service: Vascular;  Laterality: Left;   ESOPHAGEAL BANDING N/A 04/17/2020   Procedure: ESOPHAGEAL BANDING;  Surgeon: Yetta Flock, MD;  Location: [facility];  Service: Gastroenterology;  Laterality: N/A;   ESOPHAGEAL BANDING  04/29/2020   Procedure: ESOPHAGEAL BANDING;  Surgeon: Rush Landmark Telford Nab., MD;  Location: [facility];  Service: Gastroenterology;;   ESOPHAGEAL BANDING N/A 06/10/2020   Procedure: ESOPHAGEAL BANDING;  Surgeon: Yetta Flock, MD;  Location: [facility];  Service: Gastroenterology;  Laterality: N/A;   ESOPHAGEAL BANDING N/A 11/10/2020   Procedure: ESOPHAGEAL BANDING;  Surgeon: Yetta Flock, MD;  Location: [facility];  Service: Gastroenterology;  Laterality: N/A;   ESOPHAGEAL BANDING N/A 02/10/2021   Procedure: ESOPHAGEAL BANDING;  Surgeon: Jerene Bears, MD;  Location: [facility];  Service: Gastroenterology;  Laterality: N/A;   ESOPHAGEAL BANDING N/A 04/21/2021   Procedure: ESOPHAGEAL BANDING;  Surgeon: Milus Banister, MD;  Location: [facility];  Service: Endoscopy;  Laterality: N/A;   ESOPHAGEAL BANDING N/A 07/16/2021   Procedure: ESOPHAGEAL BANDING;  Surgeon: Ladene Artist, MD;  Location: [facility];  Service: Gastroenterology;  Laterality: N/A;   ESOPHAGOGASTRODUODENOSCOPY N/A 04/25/2020   Procedure: ESOPHAGOGASTRODUODENOSCOPY (EGD);  Surgeon: Carol Ada, MD;  [facility];  Service: Endoscopy;   Laterality: N/A;   ESOPHAGOGASTRODUODENOSCOPY (EGD) WITH PROPOFOL N/A 04/17/2020   Procedure: ESOPHAGOGASTRODUODENOSCOPY (EGD) WITH PROPOFOL;  Surgeon: Yetta Flock, MD;  Location: [facility];  Service: Gastroenterology;  Laterality: N/A;   ESOPHAGOGASTRODUODENOSCOPY (EGD) WITH PROPOFOL N/A 04/29/2020   Procedure: ESOPHAGOGASTRODUODENOSCOPY (EGD) WITH PROPOFOL;  Surgeon: Rush Landmark Telford Nab., MD;  Location: [facility];  Service: Gastroenterology;  Laterality: N/A;   ESOPHAGOGASTRODUODENOSCOPY (EGD) WITH PROPOFOL N/A 06/10/2020   Procedure: ESOPHAGOGASTRODUODENOSCOPY (EGD) WITH PROPOFOL;  Surgeon: Yetta Flock, MD;  Location: [facility];  Service: Gastroenterology;  Laterality: N/A;   ESOPHAGOGASTRODUODENOSCOPY (EGD) WITH PROPOFOL N/A 11/10/2020   Procedure: ESOPHAGOGASTRODUODENOSCOPY (EGD) WITH PROPOFOL;  Surgeon: Yetta Flock, MD;  [facility]: [facility];  Service: Gastroenterology;  Laterality: N/A;   ESOPHAGOGASTRODUODENOSCOPY (EGD) WITH PROPOFOL N/A 02/10/2021   Procedure: ESOPHAGOGASTRODUODENOSCOPY (EGD) WITH PROPOFOL;  Surgeon: Jerene Bears, MD;  Location: [facility];  Service: Gastroenterology;  Laterality: N/A;   ESOPHAGOGASTRODUODENOSCOPY (EGD) WITH PROPOFOL N/A 03/31/2021  Procedure: ESOPHAGOGASTRODUODENOSCOPY (EGD) WITH PROPOFOL;  Surgeon: Yetta Flock, MD;  [facility]: [facility];  Service: Gastroenterology;  Laterality: N/A;   ESOPHAGOGASTRODUODENOSCOPY (EGD) WITH PROPOFOL N/A 04/21/2021   Procedure: ESOPHAGOGASTRODUODENOSCOPY (EGD) WITH PROPOFOL;  Surgeon: Milus Banister, MD;  Location: [facility];  Service: Endoscopy;  Laterality: N/A;   ESOPHAGOGASTRODUODENOSCOPY (EGD) WITH PROPOFOL N/A 07/16/2021   Procedure: ESOPHAGOGASTRODUODENOSCOPY (EGD) WITH PROPOFOL;  Surgeon: Ladene Artist, MD;  Location: [facility];  Service: Gastroenterology;  Laterality: N/A;   ESOPHAGOGASTRODUODENOSCOPY (EGD) WITH PROPOFOL N/A 10/15/2021   Procedure:  ESOPHAGOGASTRODUODENOSCOPY (EGD) WITH PROPOFOL;  Surgeon: Yetta Flock, MD;  Location: [facility];  Service: Gastroenterology;  Laterality: N/A;   EYE SURGERY     HERNIA REPAIR     IR PARACENTESIS  09/18/2020   IR RADIOLOGIST EVAL & MGMT  10/22/2021   IR THORACENTESIS ASP PLEURAL SPACE W/IMG GUIDE  10/05/2021   KIDNEY STONE SURGERY     WISDOM TOOTH EXTRACTION      Family History  Problem Relation Age of Onset   Liver cancer Mother    Colon cancer Neg Hx    Stomach cancer Neg Hx    Colon polyps Neg Hx    Esophageal cancer Neg Hx    Rectal cancer Neg Hx     Social History   Tobacco Use   Smoking status: Never    Passive exposure: Never   Smokeless tobacco: Never  Vaping Use   Vaping Use: Never used  Substance Use Topics   Alcohol use: Not Currently   Drug use: Not Currently    Prior to Admission medications   Medication Sig Start Date End Date Taking? Authorizing Provider  acetaminophen (TYLENOL) 325 MG tablet Take 325 mg by mouth at bedtime.    [provider]  albuterol (VENTOLIN HFA) 108 (90 Base) MCG/ACT inhaler Inhale 1-2 puffs into the lungs every 6 (six) hours as needed for wheezing or shortness of breath.    [provider]  ARTIFICIAL SALIVA MT Use as directed 1 spray in the mouth or throat as needed (Dry mouth). MOUTH COAT 10/26/20   [provider]  atorvastatin (LIPITOR) 40 MG tablet Take 40 mg by mouth daily.    [provider]  buPROPion (WELLBUTRIN XL) 150 MG 24 hr tablet Take 150 mg by mouth daily.    [provider]  carbidopa-levodopa (SINEMET IR) 25-100 MG tablet Take 1 tablet by mouth in the morning, at noon, and at bedtime. 06/30/21   [provider]  carboxymethylcellulose (REFRESH TEARS) 0.5 % SOLN Place 1 drop into both eyes 3 (three) times daily as needed (dry eyes).    [provider]  citalopram (CELEXA) 40 MG tablet Take 40 mg by mouth daily.    [provider]  ferrous  sulfate 325 (65 FE) MG tablet Take 325 mg by mouth every Monday, Wednesday, and Friday.    [provider]  fluticasone (FLONASE) 50 MCG/ACT nasal spray Place 1 spray into both nostrils at bedtime.    [provider]  furosemide (LASIX) 20 MG tablet Take 20 mg by mouth daily.    [provider]  guaiFENesin (DIABETIC TUSSIN EX) 100 MG/5ML liquid Take 10 mLs by mouth every 4 (four) hours as needed for cough or to loosen phlegm.    [provider]  guaiFENesin-codeine (ROBITUSSIN AC) 100-10 MG/5ML syrup Take 5 mLs by mouth at bedtime as needed for cough. Patient not taking: Reported on 12/02/2021 05/06/21   [provider]  insulin glargine (LANTUS) 100 unit/mL SOPN Inject 16 Units into the skin daily.    [provider]  ipratropium (ATROVENT) 0.03 % nasal spray Place 1 spray into both nostrils at bedtime.    [provider]  lactulose (CHRONULAC) 10 GM/15ML solution Take 15 mLs (10 g total) by mouth 3 (three) times daily as needed for mild constipation. 10/21/21   Armbruster, Carlota Raspberry, MD  latanoprost (XALATAN) 0.005 % ophthalmic solution Place 1 drop into both eyes at bedtime. 08/20/20   [provider]  lidocaine (LIDODERM) 5 % Place 1 patch onto the skin daily as needed (pain). Remove & Discard patch within 12 hours or as directed by MD    [provider]  loratadine (CLARITIN) 10 MG tablet Take 10 mg by mouth daily. 09/04/20   [provider]  meclizine (ANTIVERT) 25 MG tablet Take 25 mg by mouth 2 (two) times daily as needed for dizziness.    [provider]  midodrine (PROAMATINE) 10 MG tablet Take 1 tablet (10 mg total) by mouth 3 (three) times daily. 10/21/21   Armbruster, Carlota Raspberry, MD  montelukast (SINGULAIR) 10 MG tablet Take 10 mg by mouth daily. 05/22/20   [provider]  Multiple Vitamin (MULTIVITAMIN WITH MINERALS) TABS tablet Take 1 tablet by mouth daily.    [provider]   pantoprazole (PROTONIX) 40 MG tablet Take 40 mg by mouth daily with breakfast.    [provider]  spironolactone (ALDACTONE) 100 MG tablet Take 50 mg by mouth every other day.    [provider]  tamsulosin (FLOMAX) 0.4 MG CAPS capsule Take 0.4 mg by mouth 2 (two) times daily.    [provider]  traZODone (DESYREL) 150 MG tablet Take 75 mg by mouth at bedtime as needed for sleep. 09/28/19   [provider]    Current Facility-Administered Medications  Medication Dose Route Frequency Provider Last Rate Last Admin   0.9 %  sodium chloride infusion  10 mL/hr Intravenous Once Blanchie Dessert, MD       octreotide (SANDOSTATIN) 500 mcg in sodium chloride 0.9 % 250 mL (2 mcg/mL) infusion  50 mcg/hr Intravenous Continuous Blanchie Dessert, MD 25 mL/hr at 01/17/22 2011 50 mcg/hr at 01/17/22 2011   [START ON 01/21/2022] pantoprazole (PROTONIX) injection 40 mg  40 mg Intravenous Q12H Plunkett, Loree Fee, MD       pantoprozole (PROTONIX) 80 mg /NS 100 mL infusion  8 mg/hr Intravenous Continuous Blanchie Dessert, MD 10 mL/hr at 01/17/22 2037 8 mg/hr at 01/17/22 2037   Current Outpatient Medications  Medication Sig Dispense Refill   acetaminophen (TYLENOL) 325 MG tablet Take 325 mg by mouth at bedtime.     albuterol (VENTOLIN HFA) 108 (90 Base) MCG/ACT inhaler Inhale 1-2 puffs into the lungs every 6 (six) hours as needed for wheezing or shortness of breath.     ARTIFICIAL SALIVA MT Use as directed 1 spray in the mouth or throat as needed (Dry mouth). MOUTH COAT     atorvastatin (LIPITOR) 40 MG tablet Take 40 mg by mouth daily.     buPROPion (WELLBUTRIN XL) 150 MG 24 hr tablet Take 150 mg by mouth daily.     carbidopa-levodopa (SINEMET IR) 25-100 MG tablet Take 1 tablet by mouth in the morning, at noon, and at bedtime.     carboxymethylcellulose (REFRESH TEARS) 0.5 % SOLN Place 1 drop into both eyes 3 (three) times daily as needed (dry eyes).     citalopram (CELEXA) 40  MG tablet Take 40 mg by mouth daily.     ferrous sulfate 325 (65 FE) MG tablet Take 325 mg by mouth every Monday, Wednesday, and Friday.     fluticasone (FLONASE) 50 MCG/ACT nasal spray Place 1 spray into both nostrils at bedtime.     furosemide (LASIX) 20 MG tablet Take 20 mg by mouth daily.     guaiFENesin (DIABETIC TUSSIN EX) 100 MG/5ML liquid Take 10 mLs by mouth every 4 (four) hours as needed for cough or to loosen phlegm.     guaiFENesin-codeine (ROBITUSSIN AC) 100-10 MG/5ML syrup Take 5 mLs by mouth at bedtime as needed for cough. (Patient not taking: Reported on 12/02/2021)     insulin glargine (LANTUS) 100 unit/mL SOPN Inject 16 Units into the skin daily.     ipratropium (ATROVENT) 0.03 % nasal spray Place 1 spray into both nostrils at bedtime.     lactulose (CHRONULAC) 10 GM/15ML solution Take 15 mLs (10 g total) by mouth 3 (three) times daily as needed for mild constipation. 1350 mL 2   latanoprost (XALATAN) 0.005 % ophthalmic solution Place 1 drop into both eyes at bedtime.     lidocaine (LIDODERM) 5 % Place 1 patch onto the skin daily as needed (pain). Remove & Discard patch within 12 hours or as directed by MD     loratadine (CLARITIN) 10 MG tablet Take 10 mg by mouth daily.     meclizine (ANTIVERT) 25 MG tablet Take 25 mg by mouth 2 (two) times daily as needed for dizziness.     midodrine (PROAMATINE) 10 MG tablet Take 1 tablet (10 mg total) by mouth 3 (three) times daily.     montelukast (SINGULAIR) 10 MG tablet Take 10 mg by mouth daily.     Multiple Vitamin (MULTIVITAMIN WITH MINERALS) TABS tablet Take 1 tablet by mouth daily.     pantoprazole (PROTONIX) 40 MG tablet Take 40 mg by mouth daily with breakfast.     spironolactone (ALDACTONE) 100 MG tablet Take 50 mg by mouth every other day.     tamsulosin (FLOMAX) 0.4 MG CAPS capsule Take 0.4 mg by mouth 2 (two) times daily.     traZODone (DESYREL) 150 MG tablet Take 75 mg by mouth at bedtime as needed for sleep.      Allergies as  of 01/17/2022 - Review Complete 01/17/2022  Allergen Reaction Noted   Lisinopril Cough 07/12/2012     Review of Systems:     All other review of systems are negative.       Physical Exam:  Vital signs in last 24 hours: Temp:  [98.7 F (37.1 C)-99.1 F (37.3 C)] 99.1 F (37.3 C) (10/22 2100) Pulse Rate:  [103-125] 103 (10/22 2100) Resp:  [15-27] 19 (10/22 2100) BP: (92-112)/(65-73) 99/66 (10/22 2100) SpO2:  [93 %-95 %] 94 % (10/22 2100) Weight:  [73.9 kg] 73.9 kg (10/22 1857)    General:  Acutely and chronically ill white man with NG tube in draining dark red blood Eyes:  anicteric.  Lungs: Crackles and decreased breath sounds right base otherwise clear Heart:   S1S2, no rubs, murmurs, gallops. Abdomen: Protuberant and somewhat distended, non-tender, BS+.  Extremities:   no edema Skin   no rash. Neuro:  A&O x 3.,  No asterixis Psych:  appropriate mood and  Affect.   Data Reviewed:   LAB RESULTS: Recent Labs    01/17/22 1911  WBC 3.6*  HGB 8.6*  HCT 26.8*  PLT 77*   BMET  Recent Labs    01/17/22 1911  NA 135  K 3.9  CL 103  CO2 23  GLUCOSE 254*  BUN 23  CREATININE 1.69*  CALCIUM 8.7*   LFT Recent Labs    01/17/22 1911  PROT 7.1  ALBUMIN 2.5*  AST 62*  ALT 45*  ALKPHOS 186*  BILITOT 0.9   PT/INR Recent Labs    01/17/22 1911  LABPROT 16.6*  INR 1.4*    STUDIES: DG Abdomen 1 View  Result Date: 01/17/2022 CLINICAL DATA:  Nasogastric tube placement EXAM: ABDOMEN - 1 VIEW COMPARISON:  None Available. FINDINGS: Nasogastric tube tip overlies the mid body of the stomach. There is moderate gaseous distension of the visualized stomach. Moderate left pleural effusion is present. Small right pleural effusion. IMPRESSION: Nasogastric tube tip within the mid body of the stomach. Electronically Signed   By: Fidela Salisbury M.D.   On: 01/17/2022 20:45   DG Chest Port 1 View  Result Date: 01/17/2022 CLINICAL DATA:  Hematemesis EXAM: PORTABLE CHEST  1 VIEW COMPARISON:  12/08/2021 FINDINGS: Cardiac enlargement. No vascular congestion. Small right and moderate left pleural effusions with basilar atelectasis or consolidation. Appearances are similar to prior study. No pneumothorax. IMPRESSION: Chronic bilateral pleural effusions with basilar atelectasis or consolidation, greater on the left. No significant change since prior study. Electronically Signed   By: Lucienne Capers M.D.   On: 01/17/2022 19:27        Thanks   LOS: 0 days   @Taggart Prasad  Simonne Maffucci, MD, [facility] @  01/17/2022, 9:21 PM

## 2022-01-17 NOTE — ED Provider Notes (Signed)
[facility] Provider Note   CSN: [id number] Arrival date & time: 01/17/22  1848     History  Chief Complaint  Patient presents with   Hematemesis    Corey Wood is a 71 y.o. male.  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  Patient's wife reports he has been coughing all day but otherwise seemed his normal self until about 5 PM tonight when he suddenly became nauseated and vomited a large amount of dark blood into the trash can.  He has not had any further episodes.  He denies having any abdominal pain or feeling nauseated currently.  He has not noticed any dark stools in the last few days.  No recent fever.  He does have a chronic cough but seemed worse today.  Currently he is denying any shortness of breath or chest pain.  He takes no anticoagulation.  He denies feeling dizzy or lightheaded.  There is no syncope at home.        Home Medications Prior to Admission medications   Medication Sig Start Date End Date Taking? Authorizing Provider  acetaminophen (TYLENOL) 325 MG tablet Take 325 mg by mouth at bedtime.    [provider]  albuterol (VENTOLIN HFA) 108 (90 Base) MCG/ACT inhaler Inhale 1-2 puffs into the lungs every 6 (six) hours as needed for wheezing or shortness of breath.    [provider]  ARTIFICIAL SALIVA MT Use as directed 1 spray in the mouth or throat as needed (Dry mouth). MOUTH COAT 10/26/20    [provider]  atorvastatin (LIPITOR) 40 MG tablet Take 40 mg by mouth daily.    [provider]  buPROPion (WELLBUTRIN XL) 150 MG 24 hr tablet Take 150 mg by mouth daily.    [provider]  carbidopa-levodopa (SINEMET IR) 25-100 MG tablet Take 1 tablet by mouth in the morning, at noon, and at bedtime. 06/30/21   [provider]  carboxymethylcellulose (REFRESH TEARS) 0.5 % SOLN Place 1 drop into both eyes 3 (three) times daily as needed (dry eyes).    [provider]  citalopram (CELEXA) 40 MG tablet Take 40 mg by mouth daily.    [provider]  ferrous sulfate 325 (65 FE) MG tablet Take 325 mg by mouth every Monday, Wednesday, and Friday.    [provider]  fluticasone (FLONASE) 50 MCG/ACT nasal spray Place 1 spray into both nostrils at bedtime.    [provider]  furosemide (LASIX) 20 MG tablet Take 20 mg by mouth daily.    [provider]  guaiFENesin (DIABETIC TUSSIN EX) 100 MG/5ML liquid Take 10 mLs by mouth every 4 (four) hours as needed for cough or to loosen phlegm.    [provider]  guaiFENesin-codeine (ROBITUSSIN AC) 100-10 MG/5ML syrup Take 5 mLs by mouth at bedtime as needed for cough. Patient not taking: Reported on 12/02/2021 05/06/21   [provider]  insulin glargine (LANTUS) 100 unit/mL SOPN Inject 16 Units  into the skin daily.    [provider]  ipratropium (ATROVENT) 0.03 % nasal spray Place 1 spray into both nostrils at bedtime.    [provider]  lactulose (CHRONULAC) 10 GM/15ML solution Take 15 mLs (10 g total) by mouth 3 (three) times daily as needed for mild constipation. 10/21/21   Armbruster, Carlota Raspberry, MD  latanoprost (XALATAN) 0.005 % ophthalmic solution Place 1 drop into both eyes at bedtime. 08/20/20   [provider]  lidocaine (LIDODERM) 5 % Place 1 patch onto the skin daily as needed (pain). Remove & Discard patch within 12 hours or as  directed by MD    [provider]  loratadine (CLARITIN) 10 MG tablet Take 10 mg by mouth daily. 09/04/20   [provider]  meclizine (ANTIVERT) 25 MG tablet Take 25 mg by mouth 2 (two) times daily as needed for dizziness.    [provider]  midodrine (PROAMATINE) 10 MG tablet Take 1 tablet (10 mg total) by mouth 3 (three) times daily. 10/21/21   Armbruster, Carlota Raspberry, MD  montelukast (SINGULAIR) 10 MG tablet Take 10 mg by mouth daily. 05/22/20   [provider]  Multiple Vitamin (MULTIVITAMIN WITH MINERALS) TABS tablet Take 1 tablet by mouth daily.    [provider]  pantoprazole (PROTONIX) 40 MG tablet Take 40 mg by mouth daily with breakfast.    [provider]  spironolactone (ALDACTONE) 100 MG tablet Take 50 mg by mouth every other day.    [provider]  tamsulosin (FLOMAX) 0.4 MG CAPS capsule Take 0.4 mg by mouth 2 (two) times daily.    [provider]  traZODone (DESYREL) 150 MG tablet Take 75 mg by mouth at bedtime as needed for sleep. 09/28/19   [provider]      Allergies    Lisinopril    Review of Systems   Review of Systems  Physical Exam Updated Vital Signs BP 99/67   Pulse (!) 110   Temp 98.7 F (37.1 C) (Oral)   Resp 19   Ht 5' 6"  (1.676 m)   Wt 73.9 kg   SpO2 95%   BMI 26.31 kg/m  Physical Exam Vitals and nursing note reviewed.  Constitutional:      General: He is not in acute distress.    Appearance: He is well-developed. He is ill-appearing.  HENT:     Head: Normocephalic and atraumatic.     Mouth/Throat:     Comments: Some dried blood present in the oropharynx Eyes:     Conjunctiva/sclera: Conjunctivae normal.     Pupils: Pupils are equal, round, and reactive to light.  Cardiovascular:     Rate and Rhythm: Tachycardia present. Rhythm irregularly irregular.     Heart sounds: No murmur heard. Pulmonary:     Effort: Pulmonary effort is normal. No respiratory distress.      Breath sounds: Examination of the right-lower field reveals decreased breath sounds. Examination of the left-lower field reveals decreased breath sounds. Decreased breath sounds present. No wheezing or rales.  Abdominal:     General: There is distension.     Palpations: Abdomen is soft.     Tenderness: There is no abdominal tenderness. There is no guarding or rebound.     Comments: Distended with some ascites present but no focal tenderness  Musculoskeletal:        General: No tenderness. Normal range of motion.     Cervical back: Normal range of motion and neck supple.  Skin:    General: Skin is warm and dry.     Coloration: Skin is pale.     Findings: No erythema or rash.  Neurological:     Mental Status: He is alert and oriented to person, place, and time.  Psychiatric:        Behavior: Behavior normal.     ED Results / Procedures / Treatments   Labs (all labs ordered are listed, but only abnormal results are displayed) Labs Reviewed  CBC WITH DIFFERENTIAL/PLATELET - Abnormal; Notable for the following components:      Result Value   WBC 3.6 (*)    RBC 2.78 (*)    Hemoglobin 8.6 (*)    HCT 26.8 (*)    RDW 16.4 (*)    Platelets 77 (*)    Lymphs Abs 0.4 (*)    All other components within normal limits  COMPREHENSIVE METABOLIC PANEL - Abnormal; Notable for the following components:   Glucose, Bld 254 (*)    Creatinine, Ser 1.69 (*)    Calcium 8.7 (*)    Albumin 2.5 (*)    AST 62 (*)    ALT 45 (*)    Alkaline Phosphatase 186 (*)    GFR, Estimated 42 (*)    All other components within normal limits  PROTIME-INR - Abnormal; Notable for the following components:   Prothrombin Time 16.6 (*)    INR 1.4 (*)    All other components within normal limits  LIPASE, BLOOD  APTT  LACTIC ACID, PLASMA  LACTIC ACID, PLASMA  TYPE AND SCREEN  PREPARE RBC (CROSSMATCH)    EKG EKG Interpretation  Date/Time:  Sunday January 17 2022 19:01:46 EDT Ventricular Rate:  119 PR  Interval:  161 QRS Duration: 90 QT Interval:  326 QTC Calculation: 459 R Axis:   57 Text Interpretation: new Atrial fibrillation Atrial premature complexes in couplets Borderline T abnormalities, diffuse leads Confirmed by Blanchie Dessert ([id number]) on 01/17/2022 7:47:27 PM  Radiology DG Chest Port 1 View  Result Date: 01/17/2022 CLINICAL DATA:  Hematemesis EXAM: PORTABLE CHEST 1 VIEW COMPARISON:  12/08/2021 FINDINGS: Cardiac enlargement. No vascular congestion. Small right and moderate left pleural effusions with basilar atelectasis or consolidation. Appearances are similar to prior study. No pneumothorax. IMPRESSION: Chronic bilateral pleural effusions with basilar atelectasis or consolidation, greater on the left. No significant change since prior study. Electronically Signed   By: Lucienne Capers M.D.   On: 01/17/2022 19:27    Procedures Procedures    Medications Ordered in ED Medications  pantoprozole (PROTONIX) 80 mg /NS 100 mL infusion (8 mg/hr Intravenous New Bag/Given 01/17/22 2037)  pantoprazole (PROTONIX) injection 40 mg (has no administration in time range)  octreotide (SANDOSTATIN) 2 mcg/mL load via infusion 50 mcg (50 mcg Intravenous Bolus from Bag 01/17/22 2012)    And  octreotide (SANDOSTATIN) 500 mcg in sodium chloride 0.9 % 250 mL (2 mcg/mL) infusion (50 mcg/hr Intravenous New Bag/Given 01/17/22 2011)  0.9 %  sodium chloride infusion (has no administration in time range)  pantoprazole (PROTONIX) 80 mg /NS 100 mL IVPB (0 mg Intravenous Stopped 01/17/22 2030)  cefTRIAXone (ROCEPHIN) 1 g in sodium chloride 0.9 % 100 mL IVPB (0 g Intravenous Stopped 01/17/22 2007)  metoCLOPramide (REGLAN) injection 10 mg (10 mg Intravenous Given 01/17/22 2041)    ED Course/ Medical Decision Making/ A&P                           Medical  Decision Making Amount and/or Complexity of Data Reviewed Independent Historian: spouse and EMS External Data Reviewed: notes.    Details: Gi and  hepatology Labs: ordered. Decision-making details documented in ED Course. Radiology: ordered and independent interpretation performed. Decision-making details documented in ED Course. ECG/medicine tests: ordered and independent interpretation performed. Decision-making details documented in ED Course.  Risk Prescription drug management. Decision regarding hospitalization.   Pt with multiple medical problems and comorbidities and presenting today with a complaint that caries a high risk for morbidity and mortality.  Here today with recurrent hemoptysis that started at 5 PM.  Patient is pale and tachycardic.  Borderline blood pressure of 99/68.  Patient's wife has a picture and there is a large amount of dark blood in the trash can that he vomited today.  He has not had recent fever but has been coughing all day.  Patient does suffer from a chronic cough and has known recurrent pleural effusions thought to be from hepatic causes.  Concern for recurrent variceal bleed today.  He has no focal abdominal tenderness and is mentating fairly normally with lower suspicion for hepatic encephalopathy and no findings concerning for SBP.  Labs, EKG and imaging are pending.  NG tube will be placed to ensure patient is not still actively bleeding.  Patient given IV Protonix, octreotide, Rocephin and type and cross was sent.  We will contact [facility] who is patient's GI service.  Currently patient is mentating normally and can trolling his airway.  Sats are 93% on room air.  8:42 PM Patient had now a second large-volume hematemesis with approximately 400 mL of blood with little bit of clot.  NG tube was placed with bloody fluid 500 mL into the canister.  I independently interpreted patient's labs today and hemoglobin is 8.6 from 10 per his wife 1 month ago, platelet count down to 77 from 100 a few months ago, CMP with stable renal function of 1.69 which is patient's baseline, mild elevated LFTs and normal total  bilirubin and BUN, INR is 1.4 and PTT is normal.  I independently interpreted patient's EKG today and it appears that patient is in atrial fibrillation most likely related to chronic medical conditions and current stress on his body. I have independently visualized and interpreted pt's images today.  Chest x-ray today with chronic left-sided pleural effusion and minimal right-sided effusion that does not look significantly different from prior x-rays.  Patient will be given 2 units of blood due to the ongoing bleeding and drop of 2 g of his hemoglobin per his wife.  We will admit patient to the ICU given his tenuous nature.  Spoke with [facility] Dr. Carlean Purl who will also consult on the patient but was hoping he would not need endoscopy tonight.  Findings discussed with the patient and his wife.  Currently he is maintaining his airway.  We will avoid intubation at this time.  CRITICAL CARE Performed by: Hanan Mcwilliams Total critical care time: 45 minutes Critical care time was exclusive of separately billable procedures and treating other patients. Critical care was necessary to treat or prevent imminent or life-threatening deterioration. Critical care was time spent personally by me on the following activities: development of treatment plan with patient and/or surrogate as well as nursing, discussions with consultants, evaluation of patient's response to treatment, examination of patient, obtaining history from patient or surrogate, ordering and performing treatments and interventions, ordering and review of laboratory studies, ordering and review of radiographic studies, pulse oximetry and  re-evaluation of patient's condition.         Final Clinical Impression(s) / ED Diagnoses Final diagnoses:  Upper GI bleed  Symptomatic anemia  Other specified hypotension    Rx / DC Orders ED Discharge Orders     None         Blanchie Dessert, MD 01/17/22 2042

## 2022-01-17 NOTE — ED Triage Notes (Signed)
PT BIB [facility] coming from home. Pt presents with hematemesis that started today. Pt with hx of esophageal varices. EMS admin 500 mL of NaCl  EMS Vitals  HR 122 in a-fib.  107/63 RR 16 96% on R/A CBG 270

## 2022-01-17 NOTE — H&P (Signed)
NAME:  Corey Wood, MRN:  [id number], DOB:  08-May-1950, LOS: 0 ADMISSION DATE:  01/17/2022, CONSULTATION DATE:  01/17/22 REFERRING MD:  Maryan Rued , CHIEF COMPLAINT:  hematemesis   History of Present Illness:  Corey Wood, is a 71 y.o. male, who presented to the [facility] ED with a chief complaint of hematemesis  They have a pertinent past medical history of NASH cirrhosis with esophageal varices with multiple bandings, portal hypertension, hepatic hydrothorax, pancytopenia, DM 2, hypertension, CKD 3-day, COPD, GERD  Prior to arrival, patient with cough over the past few days, developed hematemesis on 10/22. EMS was called. 534m IVF given.  Patient denies hematemesis, BRBPR, melena.  Initial hemoglobin 8.6, [place] hemoglobin [place] 10 [place] of PRBCs were ordered.  Patient with 2 episodes of hematemesis while in the emergency department.  GI was consulted-patient was started on ceftriaxone, PPI infusion, octreotide.  NG tube was placed.  GI planning to scope a.m. of 10/23.  PCCM was consulted for admission.  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, portal hypertension, hepatic hydrothorax, pancytopenia, DM 2, hypertension, CKD 3-day, COPD, GERD, parkinsons  Significant Hospital Events: Including procedures, antibiotic start and stop dates in addition to other pertinent events   10/22 Present[facility] to [facility] GI consult, PCCM consult, 2 U PRBC  Interim History / Subjective:  See above  Drips: octreotide, protonix  Subjective: Denies chest pain, denies shortness of breath, endorses cough, denies abdominal pain  Objective   Blood pressure 119/77, pulse 89, temperature 99.1 F (37.3 C), temperature source Oral, resp. rate 17, height 5' 6"  (1.676 m), weight 73.9 kg, SpO2 94 %.        Intake/Output Summary (Last 24 hours) at 01/17/2022 2322 Last data filed at 01/17/2022 2253 Gross per 24 hour  Intake 515.32 ml   Output --  Net 515.32 ml    Filed Weights   01/17/22 1857  Weight: 73.9 kg    Examination: General: In bed, NAD, appears comfortable HEENT: MM pink/moist, anicteric, atraumatic Neuro: RASS 0, PERRL 388m GCS 15 CV: S1S2, Afib, no m/r/g appreciated PULM:  clear in the upper lobes, clear in the lower lobes, trachea midline, chest expansion symmetric GI: soft, bsx4 active, non-tender   Extremities: warm/dry, no pretibial edema, capillary refill less than 3 seconds  Skin:  no rashes or lesions noted  Labs/Imaging WBC 3.6 Hemoglobin 8.6 Platelets 77 Glucose 254 Creatinine 1.69 (baseline 1.5-1.89) AST 62, ALT 45, albumin 2.5 Lipase within normal limits INR 1.4 Lactic 2.9 Chest x-ray: Chronic bilateral pleural effusions, no obvious infiltrate, no pneumothorax Twelve-lead: A-fib  Resolved Hospital Problem list     Assessment & Plan:  Hematemesis, suspect secondary to recurrent variceal bleeding versus peptic ulcer disease History of [place] cirrhosis, portal hypertension, esophageal varices, portal gastropathy, GERD Acute blood loss anemia, suspect secondary to above Thrombocytopenia, suspect secondary to above Lactic acidosis secondary to upper GIB Patient with hematemesis on 10/22.  Known history of variceal bleeding.  [facility] episodes [facility]  2 units of PRBCs transfusing. -Admit to ICU -Establish 2 large bore PIVs -GI consulted.  Appreciate assistance.  Planning on scoping in a.m..  Will call if patient becomes hemodynamically unstable. -Continue Protonix drip, octreotide drip, ceftriaxone -Transfuse PLT if less than 50K -Check HGB post transfusion and every 4 hours. -Check fibrinogen -Trend lactate -N.p.o.  New onset A-fib Suspect secondary to stress of critical illness, hypokalemia.  Early rate  controlled -Goal mag above 2 -Continuous telemetry -Anticoagulation contraindicated in setting of active bleed  COPD History of  hepatic hydrothorax with bilateral plural effusions on x-ray On room air -monitor -PRN albuterol  HX HTN -hold home antihypertensives  CKD3 -Ensure renal perfusion. Goal MAP 65 or greater. -Avoid neprotoxic drugs as possible. -Strict I&O's -Follow up AM creatinine  DM2 -Blood Glucose goal 140-180. -SSI  Parkinsons  -Resume home medications when able to take PO   Best Practice (right click and "Reselect all SmartList Selections" daily)   Diet/type: NPO DVT prophylaxis: SCD GI prophylaxis: PPI Lines: N/A Foley:  N/A Code Status:  full code Last date of multidisciplinary goals of care discussion [10/22 Full scope]  Labs   CBC: Recent Labs  Lab 01/17/22 1911  WBC 3.6*  NEUTROABS 2.7  HGB 8.6*  HCT 26.8*  MCV 96.4  PLT 77*     Basic Metabolic Panel: Recent Labs  Lab 01/17/22 1911  NA 135  K 3.9  CL 103  CO2 23  GLUCOSE 254*  BUN 23  CREATININE 1.69*  CALCIUM 8.7*    GFR: Estimated Creatinine Clearance: 35.1 mL/min (A) (by C-G formula based on SCr of 1.69 mg/dL (H)). Recent Labs  Lab 01/17/22 1911 01/17/22 2113  WBC 3.6*  --   LATICACIDVEN  --  2.9*     Liver Function Tests: Recent Labs  Lab 01/17/22 1911  AST 62*  ALT 45*  ALKPHOS 186*  BILITOT 0.9  PROT 7.1  ALBUMIN 2.5*    Recent Labs  Lab 01/17/22 1911  LIPASE 32    No results for input(s): "AMMONIA" in the last 168 hours.  ABG    Component Value Date/Time   PHART 7.454 (H) 04/29/2020 1625   PCO2ART 36.7 04/29/2020 1625   PO2ART 376 (H) 04/29/2020 1625   HCO3 25.8 04/29/2020 1625   TCO2 27 04/29/2020 1625   O2SAT 100.0 04/29/2020 1625     Coagulation Profile: Recent Labs  Lab 01/17/22 1911  INR 1.4*     Cardiac Enzymes: No results for input(s): "CKTOTAL", "CKMB", "CKMBINDEX", "TROPONINI" in the last 168 hours.  HbA1C: Hgb A1c MFr Bld  Date/Time Value Ref Range Status  02/26/2021 01:20 PM 6.8 (H) 4.8 - 5.6 % Final    Comment:    (NOTE) Pre diabetes:           5.7%-6.4%  Diabetes:              >6.4%  Glycemic control for   <7.0% adults with diabetes   10/15/2020 04:47 AM 7.2 (H) 4.8 - 5.6 % Final    Comment:    (NOTE) Pre diabetes:          5.7%-6.4%  Diabetes:              >6.4%  Glycemic control for   <7.0% adults with diabetes     CBG: No results for input(s): "GLUCAP" in the last 168 hours.  Review of Systems:   Positives in bold  Gen: fever, chills, weight change, fatigue, night sweats HEENT:  blurred vision, double vision, hearing loss, tinnitus, sinus congestion, rhinorrhea, sore throat, neck stiffness, dysphagia PULM:  shortness of breath, cough, sputum production, hemoptysis, wheezing CV: chest pain, edema, orthopnea, paroxysmal nocturnal dyspnea, palpitations GI:  abdominal pain, nausea, vomiting, diarrhea, hematochezia, melena, constipation, change in bowel habits GU: dysuria, hematuria, polyuria, oliguria, urethral discharge Endocrine: hot or cold intolerance, polyuria, polyphagia or appetite change Derm: rash, dry skin, scaling or peeling skin change  Heme: easy bruising, bleeding, bleeding gums Neuro: headache, numbness, weakness, slurred speech, loss of memory or consciousness   Past Medical History:  He,  has a past medical history of Allergy, Anxiety, Aortic valve disorder, Arthritis, Asthma, Cataract, Cirrhosis ([place]), CKD (chronic kidney disease), stage III ([place]), COPD (chronic obstructive pulmonary disease) ([place]), Depression, DM (diabetes mellitus) ([place]), Dyspnea, GERD (gastroesophageal reflux disease), Heart murmur, History of colon polyps, History of COVID-19 (10/14/2020), History of kidney stones, acute pancreatitis (10/2019), Hyperlipidemia, Hypertension, Hypothyroidism, Myelodysplastic syndrome ([place]), Neuromuscular disorder ([place]), Obstructive sleep apnea, Oxygen deficiency, Parkinsonism, Peripheral edema, Peripheral positional vertigo, Sleep apnea, and Stroke ([place]).   Surgical History:   Past Surgical  History:  Procedure Laterality Date   ANKLE SURGERY     CHOLECYSTECTOMY     ENDARTERECTOMY Left 04/29/2020   Procedure: CAROTID EXPLORATION removal of  central venous catheter;  Surgeon: Rosetta Posner, MD;  Location: [place];  Service: Vascular;  Laterality: Left;   ESOPHAGEAL BANDING N/A 04/17/2020   Procedure: ESOPHAGEAL BANDING;  Surgeon: Yetta Flock, MD;  Location: [facility];  Service: Gastroenterology;  Laterality: N/A;   ESOPHAGEAL BANDING  04/29/2020   Procedure: ESOPHAGEAL BANDING;  Surgeon: Rush Landmark Telford Nab., MD;  Location: [place];  Service: Gastroenterology;;   ESOPHAGEAL BANDING N/A 06/10/2020   Procedure: ESOPHAGEAL BANDING;  Surgeon: Yetta Flock, MD;  Location: [facility];  Service: Gastroenterology;  Laterality: N/A;   ESOPHAGEAL BANDING N/A 11/10/2020   Procedure: ESOPHAGEAL BANDING;  Surgeon: Yetta Flock, MD;  Location: [facility];  Service: Gastroenterology;  Laterality: N/A;   ESOPHAGEAL BANDING N/A 02/10/2021   Procedure: ESOPHAGEAL BANDING;  Surgeon: Jerene Bears, MD;  [facility];  Service: Gastroenterology;  Laterality: N/A;   ESOPHAGEAL BANDING N/A 04/21/2021   Procedure: ESOPHAGEAL BANDING;  Surgeon: Milus Banister, MD;  [facility]: [facility];  Service: Endoscopy;  Laterality: N/A;   ESOPHAGEAL BANDING N/A 07/16/2021   Procedure: ESOPHAGEAL BANDING;  Surgeon: Ladene Artist, MD;  [facility]: [facility];  Service: Gastroenterology;  Laterality: N/A;   ESOPHAGOGASTRODUODENOSCOPY N/A 04/25/2020   Procedure: ESOPHAGOGASTRODUODENOSCOPY (EGD);  Surgeon: Carol Ada, MD;  [facility];  Service: Endoscopy;  Laterality: N/A;   ESOPHAGOGASTRODUODENOSCOPY (EGD) WITH PROPOFOL N/A 04/17/2020   Procedure: ESOPHAGOGASTRODUODENOSCOPY (EGD) WITH PROPOFOL;  Surgeon: Yetta Flock, MD;  [facility];  Service: Gastroenterology;  Laterality: N/A;   ESOPHAGOGASTRODUODENOSCOPY (EGD) WITH PROPOFOL N/A 04/29/2020    Procedure: ESOPHAGOGASTRODUODENOSCOPY (EGD) WITH PROPOFOL;  Surgeon: Rush Landmark Telford Nab., MD;  Location: [place];  Service: Gastroenterology;  Laterality: N/A;   ESOPHAGOGASTRODUODENOSCOPY (EGD) WITH PROPOFOL N/A 06/10/2020   Procedure: ESOPHAGOGASTRODUODENOSCOPY (EGD) WITH PROPOFOL;  Surgeon: Yetta Flock, MD;  [facility];  Service: Gastroenterology;  Laterality: N/A;   ESOPHAGOGASTRODUODENOSCOPY (EGD) WITH PROPOFOL N/A 11/10/2020   Procedure: ESOPHAGOGASTRODUODENOSCOPY (EGD) WITH PROPOFOL;  Surgeon: Yetta Flock, MD;  [facility];  Service: Gastroenterology;  Laterality: N/A;   ESOPHAGOGASTRODUODENOSCOPY (EGD) WITH PROPOFOL N/A 02/10/2021   Procedure: ESOPHAGOGASTRODUODENOSCOPY (EGD) WITH PROPOFOL;  Surgeon: Jerene Bears, MD;  [facility];  Service: Gastroenterology;  Laterality: N/A;   ESOPHAGOGASTRODUODENOSCOPY (EGD) WITH PROPOFOL N/A 03/31/2021   Procedure: ESOPHAGOGASTRODUODENOSCOPY (EGD) WITH PROPOFOL;  Surgeon: Yetta Flock, MD;  [facility];  Service: Gastroenterology;  Laterality: N/A;   ESOPHAGOGASTRODUODENOSCOPY (EGD) WITH PROPOFOL N/A 04/21/2021   Procedure: ESOPHAGOGASTRODUODENOSCOPY (EGD) WITH PROPOFOL;  Surgeon: Milus Banister, MD;  [facility];  Service: Endoscopy;  Laterality: N/A;   ESOPHAGOGASTRODUODENOSCOPY (EGD) WITH PROPOFOL N/A 07/16/2021  Procedure: ESOPHAGOGASTRODUODENOSCOPY (EGD) WITH PROPOFOL;  Surgeon: Ladene Artist, MD;  [facility];  Service: Gastroenterology;  Laterality: N/A;   ESOPHAGOGASTRODUODENOSCOPY (EGD) WITH PROPOFOL N/A 10/15/2021   Procedure: ESOPHAGOGASTRODUODENOSCOPY (EGD) WITH PROPOFOL;  Surgeon: Yetta Flock, MD;  [facility];  Service: Gastroenterology;  Laterality: N/A;   EYE SURGERY     HERNIA REPAIR     IR PARACENTESIS  09/18/2020   IR RADIOLOGIST EVAL & MGMT  10/22/2021   IR THORACENTESIS ASP PLEURAL SPACE W/IMG GUIDE  10/05/2021    KIDNEY STONE SURGERY     WISDOM TOOTH EXTRACTION       Social History:   reports that he has never smoked. He has never been exposed to tobacco smoke. He has never used smokeless tobacco. He reports that he does not currently use alcohol. He reports that he does not currently use drugs.   Family History:  His family history includes Liver cancer in his mother. There is no history of Colon cancer, Stomach cancer, Colon polyps, Esophageal cancer, or Rectal cancer.   Allergies Allergies  Allergen Reactions   Lisinopril Cough     Home Medications  Prior to Admission medications   Medication Sig Start Date End Date Taking? Authorizing Provider  acetaminophen (TYLENOL) 325 MG tablet Take 325 mg by mouth at bedtime.    [provider]  albuterol (VENTOLIN HFA) 108 (90 Base) MCG/ACT inhaler Inhale 1-2 puffs into the lungs every 6 (six) hours as needed for wheezing or shortness of breath.    [provider]  ARTIFICIAL SALIVA MT Use as directed 1 spray in the mouth or throat as needed (Dry mouth). MOUTH COAT 10/26/20   [provider]  atorvastatin (LIPITOR) 40 MG tablet Take 40 mg by mouth daily.    [provider]  buPROPion (WELLBUTRIN XL) 150 MG 24 hr tablet Take 150 mg by mouth daily.    [provider]  carbidopa-levodopa (SINEMET IR) 25-100 MG tablet Take 1 tablet by mouth in the morning, at noon, and at bedtime. 06/30/21   [provider]  carboxymethylcellulose (REFRESH TEARS) 0.5 % SOLN Place 1 drop into both eyes 3 (three) times daily as needed (dry eyes).    [provider]  citalopram (CELEXA) 40 MG tablet Take 40 mg by mouth daily.    [provider]  ferrous sulfate 325 (65 FE) MG tablet Take 325 mg by mouth every Monday, Wednesday, and Friday.    [provider]  fluticasone (FLONASE) 50 MCG/ACT nasal spray Place 1 spray into both nostrils at bedtime.    [provider]  furosemide (LASIX) 20 MG  tablet Take 20 mg by mouth daily.    [provider]  guaiFENesin (DIABETIC TUSSIN EX) 100 MG/5ML liquid Take 10 mLs by mouth every 4 (four) hours as needed for cough or to loosen phlegm.    [provider]  guaiFENesin-codeine (ROBITUSSIN AC) 100-10 MG/5ML syrup Take 5 mLs by mouth at bedtime as needed for cough. Patient not taking: Reported on 12/02/2021 05/06/21   [provider]  insulin glargine (LANTUS) 100 unit/mL SOPN Inject 16 Units into the skin daily.    [provider]  ipratropium (ATROVENT) 0.03 % nasal spray Place 1 spray into both nostrils at bedtime.    [provider]  lactulose (CHRONULAC) 10 GM/15ML solution Take 15 mLs (10 g total) by mouth 3 (three) times daily as needed for mild constipation. 10/21/21   Armbruster, Carlota Raspberry, MD  latanoprost (  XALATAN) 0.005 % ophthalmic solution Place 1 drop into both eyes at bedtime. 08/20/20   [provider]  lidocaine (LIDODERM) 5 % Place 1 patch onto the skin daily as needed (pain). Remove & Discard patch within 12 hours or as directed by MD    [provider]  loratadine (CLARITIN) 10 MG tablet Take 10 mg by mouth daily. 09/04/20   [provider]  meclizine (ANTIVERT) 25 MG tablet Take 25 mg by mouth 2 (two) times daily as needed for dizziness.    [provider]  midodrine (PROAMATINE) 10 MG tablet Take 1 tablet (10 mg total) by mouth 3 (three) times daily. 10/21/21   Armbruster, Carlota Raspberry, MD  montelukast (SINGULAIR) 10 MG tablet Take 10 mg by mouth daily. 05/22/20   [provider]  Multiple Vitamin (MULTIVITAMIN WITH MINERALS) TABS tablet Take 1 tablet by mouth daily.    [provider]  pantoprazole (PROTONIX) 40 MG tablet Take 40 mg by mouth daily with breakfast.    [provider]  spironolactone (ALDACTONE) 100 MG tablet Take 50 mg by mouth every other day.    [provider]  tamsulosin (FLOMAX) 0.4 MG CAPS capsule Take 0.4  mg by mouth 2 (two) times daily.    [provider]  traZODone (DESYREL) 150 MG tablet Take 75 mg by mouth at bedtime as needed for sleep. 09/28/19   [provider]     Critical care time: 39 minutes    The patient is critically ill with multiple organ systems failure and requires high complexity decision making for assessment and support, frequent evaluation and titration of therapies, application of advanced monitoring technologies and extensive interpretation of multiple databases.    Critical Care Time devoted to patient care services described in this note is 39 minutes. This time reflects time of care of this [place] NP. This critical care time does not reflect procedure time but could involve care discussion time with the PCCM attending.  Redmond School., MSN, APRN, AGACNP-BC [facility]  01/17/2022 , 11:22 PM  Please see [email or web address] for pager details  If no response, please call [phone] After hours, please call Elink at [phone]  *Note was originally put in a a consult note, but should have been entered as a H&P

## 2022-01-18 ENCOUNTER — Inpatient Hospital Stay (HOSPITAL_COMMUNITY): Payer: No Typology Code available for payment source | Admitting: Anesthesiology

## 2022-01-18 ENCOUNTER — Other Ambulatory Visit (HOSPITAL_COMMUNITY): Payer: No Typology Code available for payment source

## 2022-01-18 ENCOUNTER — Encounter (HOSPITAL_COMMUNITY): Payer: Self-pay | Admitting: Pulmonary Disease

## 2022-01-18 ENCOUNTER — Encounter (HOSPITAL_COMMUNITY)
Admission: EM | Disposition: A | Discharge status: Home Health | Payer: Self-pay | Source: Home / Self Care | Attending: Pulmonary Disease

## 2022-01-18 DIAGNOSIS — J449 Chronic obstructive pulmonary disease, unspecified: Secondary | ICD-10-CM

## 2022-01-18 DIAGNOSIS — K3189 Other diseases of stomach and duodenum: Secondary | ICD-10-CM

## 2022-01-18 DIAGNOSIS — N1831 Chronic kidney disease, stage 3a: Secondary | ICD-10-CM

## 2022-01-18 DIAGNOSIS — E039 Hypothyroidism, unspecified: Secondary | ICD-10-CM

## 2022-01-18 DIAGNOSIS — I8510 Secondary esophageal varices without bleeding: Secondary | ICD-10-CM

## 2022-01-18 DIAGNOSIS — K7460 Unspecified cirrhosis of liver: Secondary | ICD-10-CM

## 2022-01-18 DIAGNOSIS — I8511 Secondary esophageal varices with bleeding: Secondary | ICD-10-CM

## 2022-01-18 DIAGNOSIS — N183 Chronic kidney disease, stage 3 unspecified: Secondary | ICD-10-CM

## 2022-01-18 DIAGNOSIS — K7581 Nonalcoholic steatohepatitis (NASH): Secondary | ICD-10-CM

## 2022-01-18 DIAGNOSIS — D638 Anemia in other chronic diseases classified elsewhere: Secondary | ICD-10-CM

## 2022-01-18 HISTORY — PX: ESOPHAGOGASTRODUODENOSCOPY (EGD) WITH PROPOFOL: SHX5813

## 2022-01-18 HISTORY — PX: ESOPHAGEAL BANDING: SHX5518

## 2022-01-18 LAB — CBC
HCT: 29.9 % — ABNORMAL LOW (ref 39.0–52.0)
HCT: 29.7 % — ABNORMAL LOW (ref 39.0–52.0)
HCT: 29.4 % — ABNORMAL LOW (ref 39.0–52.0)
Hemoglobin: 9.6 g/dL — ABNORMAL LOW (ref 13.0–17.0)
Hemoglobin: 9.5 g/dL — ABNORMAL LOW (ref 13.0–17.0)
Hemoglobin: 9.6 g/dL — ABNORMAL LOW (ref 13.0–17.0)
MCH: 29 pg (ref 26.0–34.0)
MCH: 29.2 pg (ref 26.0–34.0)
MCH: 29.9 pg (ref 26.0–34.0)
MCHC: 32.1 g/dL (ref 30.0–36.0)
MCHC: 32 g/dL (ref 30.0–36.0)
MCHC: 32.7 g/dL (ref 30.0–36.0)
MCV: 90.3 fL (ref 80.0–100.0)
MCV: 91.4 fL (ref 80.0–100.0)
MCV: 91.6 fL (ref 80.0–100.0)
Platelets: 77 10*3/uL — ABNORMAL LOW (ref 150–400)
Platelets: 83 10*3/uL — ABNORMAL LOW (ref 150–400)
Platelets: 88 10*3/uL — ABNORMAL LOW (ref 150–400)
RBC: 3.31 MIL/uL — ABNORMAL LOW (ref 4.22–5.81)
RBC: 3.25 MIL/uL — ABNORMAL LOW (ref 4.22–5.81)
RBC: 3.21 MIL/uL — ABNORMAL LOW (ref 4.22–5.81)
RDW: 18.6 % — ABNORMAL HIGH (ref 11.5–15.5)
RDW: 18.7 % — ABNORMAL HIGH (ref 11.5–15.5)
RDW: 19.2 % — ABNORMAL HIGH (ref 11.5–15.5)
WBC: 4 10*3/uL (ref 4.0–10.5)
WBC: 4.2 10*3/uL (ref 4.0–10.5)
WBC: 5 10*3/uL (ref 4.0–10.5)
nRBC: 0 % (ref 0.0–0.2)
nRBC: 0 % (ref 0.0–0.2)
nRBC: 0 % (ref 0.0–0.2)

## 2022-01-18 LAB — BPAM RBC
Blood Product Expiration Date: 202311182359
Blood Product Expiration Date: 202311182359
ISSUE DATE / TIME: 202310222059
ISSUE DATE / TIME: 202310222248
Unit Type and Rh: 5100
Unit Type and Rh: 5100

## 2022-01-18 LAB — TYPE AND SCREEN
ABO/RH(D): O POS
Antibody Screen: NEGATIVE
Unit division: 0
Unit division: 0

## 2022-01-18 LAB — MRSA NEXT GEN BY PCR, NASAL: MRSA by PCR Next Gen: NOT DETECTED

## 2022-01-18 LAB — BASIC METABOLIC PANEL
Anion gap: 8 (ref 5–15)
BUN: 26 mg/dL — ABNORMAL HIGH (ref 8–23)
CO2: 24 mmol/L (ref 22–32)
Calcium: 8.7 mg/dL — ABNORMAL LOW (ref 8.9–10.3)
Chloride: 105 mmol/L (ref 98–111)
Creatinine, Ser: 1.63 mg/dL — ABNORMAL HIGH (ref 0.61–1.24)
GFR, Estimated: 44 mL/min — ABNORMAL LOW (ref >60)
Glucose, Bld: 132 mg/dL — ABNORMAL HIGH (ref 70–99)
Potassium: 4.4 mmol/L (ref 3.5–5.1)
Sodium: 137 mmol/L (ref 135–145)

## 2022-01-18 LAB — GLUCOSE, CAPILLARY
Glucose-Capillary: 131 mg/dL — ABNORMAL HIGH (ref 70–99)
Glucose-Capillary: 198 mg/dL — ABNORMAL HIGH (ref 70–99)
Glucose-Capillary: 137 mg/dL — ABNORMAL HIGH (ref 70–99)

## 2022-01-18 LAB — PHOSPHORUS: Phosphorus: 3.3 mg/dL (ref 2.5–4.6)

## 2022-01-18 LAB — HEMOGLOBIN A1C
Hgb A1c MFr Bld: 6.6 % — ABNORMAL HIGH (ref 4.8–5.6)
Mean Plasma Glucose: 142.72 mg/dL

## 2022-01-18 LAB — CBG MONITORING, ED
Glucose-Capillary: 128 mg/dL — ABNORMAL HIGH (ref 70–99)
Glucose-Capillary: 147 mg/dL — ABNORMAL HIGH (ref 70–99)
Glucose-Capillary: 211 mg/dL — ABNORMAL HIGH (ref 70–99)

## 2022-01-18 LAB — MAGNESIUM: Magnesium: 1.6 mg/dL — ABNORMAL LOW (ref 1.7–2.4)

## 2022-01-18 LAB — FIBRINOGEN: Fibrinogen: 238 mg/dL (ref 210–475)

## 2022-01-18 SURGERY — ESOPHAGOGASTRODUODENOSCOPY (EGD) WITH PROPOFOL
Anesthesia: General

## 2022-01-18 MED ORDER — LIDOCAINE 2% (20 MG/ML) 5 ML SYRINGE
INTRAMUSCULAR | Status: DC | PRN
  Administered 2022-01-18: 70 mg via INTRAVENOUS

## 2022-01-18 MED ORDER — ORAL CARE MOUTH RINSE: 15.0000 mL | OROMUCOSAL | Status: DC | PRN

## 2022-01-18 MED ORDER — SODIUM CHLORIDE 0.9 % IV SOLN: INTRAVENOUS | Status: DC

## 2022-01-18 MED ORDER — CHLORHEXIDINE GLUCONATE CLOTH 2 % EX PADS
6.0000 | MEDICATED_PAD | Freq: Every day | CUTANEOUS | Status: DC
  Administered 2022-01-19: 6 via TOPICAL

## 2022-01-18 MED ORDER — PANTOPRAZOLE SODIUM 40 MG IV SOLR
40.0000 mg | Freq: Two times a day (BID) | INTRAVENOUS | Status: DC
  Administered 2022-01-18 – 2022-01-20 (×4): 40 mg via INTRAVENOUS
  Filled 2022-01-18 (×4): qty 10

## 2022-01-18 MED ORDER — SUCCINYLCHOLINE CHLORIDE 200 MG/10ML IV SOSY
PREFILLED_SYRINGE | INTRAVENOUS | Status: DC | PRN
  Administered 2022-01-18: 80 mg via INTRAVENOUS

## 2022-01-18 MED ORDER — PROPOFOL 10 MG/ML IV BOLUS
INTRAVENOUS | Status: DC | PRN
  Administered 2022-01-18: 80 mg via INTRAVENOUS

## 2022-01-18 MED ORDER — DEXAMETHASONE SODIUM PHOSPHATE 10 MG/ML IJ SOLN
INTRAMUSCULAR | Status: DC | PRN
  Administered 2022-01-18: 5 mg via INTRAVENOUS

## 2022-01-18 MED ORDER — ONDANSETRON HCL 4 MG/2ML IJ SOLN
INTRAMUSCULAR | Status: DC | PRN
  Administered 2022-01-18: 4 mg via INTRAVENOUS

## 2022-01-18 MED ORDER — SUGAMMADEX SODIUM 200 MG/2ML IV SOLN
INTRAVENOUS | Status: DC | PRN
  Administered 2022-01-18: 200 mg via INTRAVENOUS

## 2022-01-18 MED ORDER — ROCURONIUM BROMIDE 10 MG/ML (PF) SYRINGE
PREFILLED_SYRINGE | INTRAVENOUS | Status: DC | PRN
  Administered 2022-01-18: 30 mg via INTRAVENOUS

## 2022-01-18 MED ORDER — MAGNESIUM SULFATE 2 GM/50ML IV SOLN
2.0000 g | Freq: Once | INTRAVENOUS | Status: AC
  Administered 2022-01-18: 2 g via INTRAVENOUS
  Filled 2022-01-18 (×2): qty 50

## 2022-01-18 MED ORDER — LACTATED RINGERS IV SOLN: INTRAVENOUS | Status: DC | PRN

## 2022-01-18 MED ORDER — PHENYLEPHRINE 80 MCG/ML (10ML) SYRINGE FOR IV PUSH (FOR BLOOD PRESSURE SUPPORT)
PREFILLED_SYRINGE | INTRAVENOUS | Status: DC | PRN
  Administered 2022-01-18: 80 ug via INTRAVENOUS
  Administered 2022-01-18: 160 ug via INTRAVENOUS
  Administered 2022-01-18: 80 ug via INTRAVENOUS

## 2022-01-18 SURGICAL SUPPLY — 15 items

## 2022-01-18 NOTE — ED Notes (Signed)
Patient resting with wife at bedside. No s/s of any distress. This fa. No verbal c/o pain or discomfort will continue to monitor

## 2022-01-18 NOTE — Anesthesia Preprocedure Evaluation (Addendum)
Anesthesia Evaluation  Patient identified by MRN, date of birth, ID band Patient awake    Reviewed: Allergy & Precautions, NPO status , Patient's Chart, lab work & pertinent test results  Airway Mallampati: III  TM Distance: >3 FB Neck ROM: Full    Dental  (+) Dental Advisory Given   Pulmonary asthma , sleep apnea , COPD,  COPD inhaler,    Pulmonary exam normal breath sounds clear to auscultation       Cardiovascular hypertension, Pt. on medications +CHF (RVSF low normal)  + Valvular Problems/Murmurs  Rhythm:Regular Rate:Normal  Echo 2022 1. Left ventricular ejection fraction, by estimation, is 60 to 65%. The left ventricle has normal function. The left ventricle has no regional wall motion abnormalities. Left ventricular diastolic parameters were normal.  2. Right ventricular systolic function is low normal. The right ventricular size is mildly enlarged. There is normal pulmonary artery systolic pressure.  3. The mitral valve is normal in structure. Trivial mitral valve regurgitation.  4. The aortic valve is abnormal. Aortic valve regurgitation is mild. Mild to moderate aortic valve sclerosis/calcification is present, without any evidence of aortic stenosis.  5. Aortic dilatation noted. There is mild dilatation of the ascending aorta, measuring 39 mm.  6. The inferior vena cava is normal in size with greater than 50% respiratory variability, suggesting right atrial pressure of 3 mmHg.    Neuro/Psych PSYCHIATRIC DISORDERS Anxiety Depression CVA    GI/Hepatic GERD  Medicated,(+) Cirrhosis       ,   Endo/Other  diabetes, Type 2, Insulin DependentHypothyroidism   Renal/GU CRFRenal disease     Musculoskeletal  (+) Arthritis ,   Abdominal   Peds  Hematology  (+) Blood dyscrasia, anemia ,   Anesthesia Other Findings   Reproductive/Obstetrics                            Anesthesia  Physical  Anesthesia Plan  ASA: 3  Anesthesia Plan: General   Post-op Pain Management: Minimal or no pain anticipated   Induction: Intravenous, Rapid sequence and Cricoid pressure planned  PONV Risk Score and Plan: 2 and Treatment may vary due to age or medical condition, Ondansetron and Dexamethasone  Airway Management Planned: Oral ETT  Additional Equipment: None  Intra-op Plan:   Post-operative Plan: Extubation in OR  Informed Consent: I have reviewed the patients History and Physical, chart, labs and discussed the procedure including the risks, benefits and alternatives for the proposed anesthesia with the patient or authorized representative who has indicated his/her understanding and acceptance.     Dental advisory given  Plan Discussed with: CRNA  Anesthesia Plan Comments:       Anesthesia Quick Evaluation

## 2022-01-18 NOTE — Interval H&P Note (Signed)
History and Physical Interval Note:  01/18/2022 11:05 AM  Corey Wood  has presented today for surgery, with the diagnosis of hematemesis.  The various methods of treatment have been discussed with the patient and family. After consideration of risks, benefits and other options for treatment, the patient has consented to  Procedure(s): ESOPHAGOGASTRODUODENOSCOPY (EGD) WITH PROPOFOL (N/A) as a surgical intervention.  The patient's history has been reviewed, patient examined, no change in status, stable for surgery.  I have reviewed the patient's chart and labs.  Questions were answered to the patient's satisfaction.     Silvano Rusk

## 2022-01-18 NOTE — Op Note (Addendum)
[facility] Patient Name: Corey Wood Procedure Date : 01/18/2022 MRN: [id number] Attending MD: Gatha Mayer , MD Date of Birth: 1950/06/24 CSN: [id number] Age: 71 Admit Type: Inpatient Procedure:                Upper GI endoscopy Indications:              Hematemesis Providers:                Gatha Mayer, MD, Doristine Johns, RN, Darliss Cheney, Technician Referring MD:              Medicines:                General Anesthesia Complications:            No immediate complications. Estimated Blood Loss:     Estimated blood loss was minimal. Procedure:                Pre-Anesthesia Assessment:                           - Prior to the procedure, a History and Physical                            was performed, and patient medications and                            allergies were reviewed. The patient's tolerance of                            previous anesthesia was also reviewed. The risks                            and benefits of the procedure and the sedation                            options and risks were discussed with the patient.                            All questions were answered, and informed consent                            was obtained. Prior Anticoagulants: The patient has                            taken no previous anticoagulant or antiplatelet                            agents. ASA Grade Assessment: III - A patient with                            severe systemic disease. After reviewing the risks  and benefits, the patient was deemed in                            satisfactory condition to undergo the procedure.                           After obtaining informed consent, the endoscope was                            passed under direct vision. Throughout the                            procedure, the patient's blood pressure, pulse, and                            oxygen saturations were monitored  continuously. The                            GIF-H190 ([id number] Olympus endoscope was introduced                            through the mouth, and advanced to the second part                            of duodenum. The upper GI endoscopy was                            accomplished without difficulty. The patient                            tolerated the procedure well. Scope In: Scope Out: Findings:      Grade II, large (> 5 mm) varices were found in the distal esophagus.       They were 6 mm in largest diameter. Four bands were successfully placed       with eradication achieved. There was bleeding from one varyx (nipple       lesion) after banding that stopped. This varyx was difficult to band and       there were multiple misfires - associated scarring from prior bandings       made it difficult to suction enough tissue. Other varices had red wale       signs vs NG trauma - 3 columns of varices      Patchy inflammation characterized by erythema and pale mucosa was found       in the entire examined stomach.      Mild portal hypertensive gastropathy was found in the entire examined       stomach.      The exam was otherwise without abnormality.      The cardia and gastric fundus were otherwise normal on retroflexion. Impression:               - Grade II and large (> 5 mm) esophageal varices.                            Banded. x 4 some bleeding after banding of smaller  varyx with nipple sign - it stopped.                           - Gastritis.                           - Portal hypertensive gastropathy.                           - The examination was otherwise normal.                           - No specimens collected. Recommendation:           - Return patient to ICU for ongoing care.                           - NPO.                           - I have changed PPI to 40 mg q12                           Continue octreotide                           Monitor  for rebleeding and if so ask IR for TIPS -                            and that may make sense anyway though depending                            upon course may not be needed this hospitalization                           [place] to evaluate for ascites Procedure Code(s):        --- Professional ---                           [phone], Esophagogastroduodenoscopy, flexible,                            transoral; with band ligation of esophageal/gastric                            varices Diagnosis Code(s):        --- Professional ---                           I85.00, Esophageal varices without bleeding                           K29.70, Gastritis, unspecified, without bleeding                           K76.6, Portal hypertension  K31.89, Other diseases of stomach and duodenum                           K92.0, Hematemesis CPT copyright 2019 [employer]. All rights reserved. The codes documented in this report are preliminary and upon coder review may  be revised to meet current compliance requirements. Gatha Mayer, MD 01/18/2022 12:03:53 PM This report has been signed electronically. Number of Addenda: 0

## 2022-01-18 NOTE — Progress Notes (Signed)
NAME:  Corey Wood, MRN:  [id number], DOB:  December 16, 1950, LOS: 1 ADMISSION DATE:  01/17/2022, CONSULTATION DATE:  01/17/22 REFERRING MD:  Maryan Rued , CHIEF COMPLAINT:  hematemesis   History of Present Illness:  Corey Wood, is a 71 y.o. 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 EMS was called. 538m IVF given.  Patient denies hematemesis, BRBPR, melena.  Initial hemoglobin 8.6, [place].  Patient with 2 episodes of hematemesis while in the emergency department.  GI was consulted-patient was started on ceftriaxone, PPI infusion, octreotide.  NG tube was placed.  GI planning to scope a.m. of 10/23.  PCCM was consulted for admission.  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   Interim History / Subjective:  Complaining of cough immediately post banding in the PACU  Objective   Blood pressure (!) 129/94, pulse (!) 108, temperature 98.1 F (36.7 C), resp. rate (!) 21, height 5' 6"  (1.676 m), weight 73.9 kg, SpO2 94 %.        Intake/Output Summary (Last 24 hours) at 01/18/2022 1231 Last data filed at 01/18/2022 1146 Gross per 24 hour  Intake 1330.32 ml  Output 5 ml  Net 1325.32 ml    Filed Weights    01/17/22 1857 01/18/22 1000  Weight: 73.9 kg 73.9 kg    Examination: General:  Elderly appearing male in NAD Neuro:  Somnolent, but easily arouses to interact with exam. Oriented. Non-focal.  HEENT:  /AT, No JVD noted, PERRL Cardiovascular:  RRR, no MRG Lungs:  Clear bilateral breath sounds Abdomen:  Soft, non-distended, non-tender.  Musculoskeletal:  No acute deformity Skin:  Intact, MMM  Labs/Imaging   Resolved Hospital Problem list     Assessment & Plan:   Hematemesis, secondary to recurrent variceal bleeding now status post banding by Dr GCarlean Purl10/23. Acute blood loss anemia History of Nash cirrhosis, portal hypertension, esophageal varices, portal gastropathy, GERD Thrombocytopenia - Watch in ICU overnight, re-bleeding risk.  - Appreciate GI - Continue Protonix, octreotide overnight as well.  - NPO - Trend H&H q 6 hours - Ceftriaxone - Holding lactulose, lasix, midodrine, and spironolactone while NPO - Transfuse platelets if fall below 50,000  New onset A-fib Suspect secondary to stress of critical illness, hypokalemia.  Early rate controlled -Goal mag above 2 -Continuous telemetry -Anticoagulation contraindicated in setting of active bleed  COPD - PRN albuterol per home regimen  History of hepatic hydrothorax with bilateral plural effusions on x-ray - Requiring some oxygen in the post anesthesia setting. On room air previously. - TIPS has been discussed in the past and patient is considering. - Consider repeat thoracentesis if he were to develop respiratory distress.   HX HTN -hold home antihypertensives  CKD3 -Avoid neprotoxic drugs as possible. -Strict I&O's -Follow up AM creatinine  DM2 -Blood Glucose goal  140-180. -SSI -Holding levemir while NPO  Parkinsons  -Resume home medications when able to take PO   Best Practice (right click and "Reselect all SmartList Selections" daily)   Diet/type: NPO DVT prophylaxis: SCD GI prophylaxis:  PPI Lines: N/A Foley:  N/A Code Status:  full code Last date of multidisciplinary goals of care discussion [10/22 Full scope]  Labs   CBC: Recent Labs  Lab 01/17/22 1911 01/18/22 0132 01/18/22 0434  WBC 3.6* 4.0 4.2  NEUTROABS 2.7  --   --   HGB 8.6* 9.6* 9.5*  HCT 26.8* 29.9* 29.7*  MCV 96.4 90.3 91.4  PLT 77* 77* 83*     Basic Metabolic Panel: Recent Labs  Lab 01/17/22 1911 01/18/22 0434  NA 135 137  K 3.9 4.4  CL 103 105  CO2 23 24  GLUCOSE 254* 132*  BUN 23 26*  CREATININE 1.69* 1.63*  CALCIUM 8.7* 8.7*  MG  --  1.6*  PHOS  --  3.3    GFR: Estimated Creatinine Clearance: 36.4 mL/min (A) (by C-G formula based on SCr of 1.63 mg/dL (H)). Recent Labs  Lab 01/17/22 1911 01/17/22 2113 01/18/22 0132 01/18/22 0434  WBC 3.6*  --  4.0 4.2  LATICACIDVEN  --  2.9*  --   --      Liver Function Tests: Recent Labs  Lab 01/17/22 1911  AST 62*  ALT 45*  ALKPHOS 186*  BILITOT 0.9  PROT 7.1  ALBUMIN 2.5*    Recent Labs  Lab 01/17/22 1911  LIPASE 32    No results for input(s): "AMMONIA" in the last 168 hours.  ABG    Component Value Date/Time   PHART 7.454 (H) 04/29/2020 1625   PCO2ART 36.7 04/29/2020 1625   PO2ART 376 (H) 04/29/2020 1625   HCO3 25.8 04/29/2020 1625   TCO2 27 04/29/2020 1625   O2SAT 100.0 04/29/2020 1625     Coagulation Profile: Recent Labs  Lab 01/17/22 1911  INR 1.4*     Cardiac Enzymes: No results for input(s): "CKTOTAL", "CKMB", "CKMBINDEX", "TROPONINI" in the last 168 hours.  HbA1C: Hgb A1c MFr Bld  Date/Time Value Ref Range Status  01/18/2022 04:34 AM 6.6 (H) 4.8 - 5.6 % Final    Comment:    (NOTE) Pre diabetes:          5.7%-6.4%  Diabetes:              >6.4%  Glycemic control for   <7.0% adults with diabetes   02/26/2021 01:20 PM 6.8 (H) 4.8 - 5.6 % Final    Comment:    (NOTE) Pre diabetes:          5.7%-6.4%  Diabetes:              >6.4%  Glycemic control for   <7.0% adults with diabetes      CBG: Recent Labs  Lab 01/18/22 0021 01/18/22 0414 01/18/22 0816  GLUCAP 211* 128* 147*    Critical care time: 37 minutes     Georgann Housekeeper, AGACNP-BC [facility]  See Amion for personal pager PCCM on call pager [phone] until 7pm. Please call Elink 7p-7a. [phone]  01/18/2022 12:57 PM

## 2022-01-18 NOTE — Transfer of Care (Signed)
Immediate Anesthesia Transfer of Care Note  Patient: SAHARSH STERLING  Procedure(s) Performed: ESOPHAGOGASTRODUODENOSCOPY (EGD) WITH PROPOFOL ESOPHAGEAL BANDING  Patient Location: PACU  Anesthesia Type:General  Level of Consciousness: awake and alert   Airway & Oxygen Therapy: Patient Spontanous Breathing and Patient connected to nasal cannula oxygen  Post-op Assessment: Report given to RN and Post -op Vital signs reviewed and stable  Post vital signs: Reviewed and stable  Last Vitals:  Vitals Value Taken Time  BP 116/90 01/18/22 1152  Temp    Pulse 85 01/18/22 1156  Resp 27 01/18/22 1156  SpO2 92 % 01/18/22 1156  Vitals shown include unvalidated device data.  Last Pain:  Vitals:   01/18/22 1000  TempSrc: Temporal  PainSc: 0-No pain         Complications: No notable events documented.

## 2022-01-18 NOTE — Anesthesia Procedure Notes (Signed)
Procedure Name: Intubation Date/Time: 01/18/2022 11:19 AM  Performed by: Inda Coke, CRNAPre-anesthesia Checklist: Patient identified, Emergency Drugs available, Suction available, Timeout performed and Patient being monitored Patient Re-evaluated:Patient Re-evaluated prior to induction Oxygen Delivery Method: Circle system utilized Preoxygenation: Pre-oxygenation with 100% oxygen Induction Type: IV induction and Rapid sequence Ventilation: Mask ventilation without difficulty Laryngoscope Size: Mac and 4 Grade View: Grade I Tube type: Oral Tube size: 7.5 mm Number of attempts: 1 Airway Equipment and Method: Stylet Placement Confirmation: ETT inserted through vocal cords under direct vision, positive ETCO2, CO2 detector and breath sounds checked- equal and bilateral Secured at: 23 cm Tube secured with: Tape Dental Injury: Teeth and Oropharynx as per pre-operative assessment

## 2022-01-18 NOTE — Progress Notes (Signed)
Daily Rounding Note  01/18/2022, 8:18 AM  LOS: 1 day   SUBJECTIVE:   Chief complaint:   upper GIB w hematemesis and melena  Patient feels okay.  Has an NG tube in place which is suctioning out dark coffee like material.  Stools are black, not tarry consistently but smell of melena. No confusion.  No pain.  No shortness of breath.  OBJECTIVE:         Vital signs in last 24 hours:    Temp:  [98.3 F (36.8 C)-99.7 F (37.6 C)] 98.3 F (36.8 C) (10/23 0748) Pulse Rate:  [36-125] 77 (10/23 0808) Resp:  [15-30] 20 (10/23 0808) BP: (92-140)/(51-108) 127/87 (10/23 0745) SpO2:  [92 %-95 %] 94 % (10/23 0808) Weight:  [73.9 kg] 73.9 kg (10/22 1857)   Filed Weights   01/17/22 1857  Weight: 73.9 kg   General: Looks pale, moderately ill.  Alert.  Comfortable. Heart: Irregularly irregular. Chest: Clear bilaterally without labored breathing. Abdomen: Soft, nontender, nondistended.  No HSM, masses, bruits.  Dark brown, coffee like material in NG canister. Extremities: No CCE. Neuro/Psych: Alert.  Appropriate.  Oriented x3.  Intake/Output from previous day: 10/22 0701 - 10/23 0700 In: 830.3 [Blood:630; IV Piggyback:200.3] Out: -   Intake/Output this shift: No intake/output data recorded.  Lab Results: Recent Labs    01/17/22 1911 01/18/22 0132 01/18/22 0434  WBC 3.6* 4.0 4.2  HGB 8.6* 9.6* 9.5*  HCT 26.8* 29.9* 29.7*  PLT 77* 77* 83*   BMET Recent Labs    01/17/22 1911 01/18/22 0434  NA 135 137  K 3.9 4.4  CL 103 105  CO2 23 24  GLUCOSE 254* 132*  BUN 23 26*  CREATININE 1.69* 1.63*  CALCIUM 8.7* 8.7*   LFT Recent Labs    01/17/22 1911  PROT 7.1  ALBUMIN 2.5*  AST 62*  ALT 45*  ALKPHOS 186*  BILITOT 0.9   PT/INR Recent Labs    01/17/22 1911  LABPROT 16.6*  INR 1.4*   Hepatitis Panel No results for input(s): "HEPBSAG", "HCVAB", "HEPAIGM", "HEPBIGM" in the last 72  hours.  Studies/Results: DG Abdomen 1 View  Result Date: 01/17/2022 CLINICAL DATA:  Nasogastric tube placement EXAM: ABDOMEN - 1 VIEW COMPARISON:  None Available. FINDINGS: Nasogastric tube tip overlies the mid body of the stomach. There is moderate gaseous distension of the visualized stomach. Moderate left pleural effusion is present. Small right pleural effusion. IMPRESSION: Nasogastric tube tip within the mid body of the stomach. Electronically Signed   By: Fidela Salisbury M.D.   On: 01/17/2022 20:45   DG Chest Port 1 View  Result Date: 01/17/2022 CLINICAL DATA:  Hematemesis EXAM: PORTABLE CHEST 1 VIEW COMPARISON:  12/08/2021 FINDINGS: Cardiac enlargement. No vascular congestion. Small right and moderate left pleural effusions with basilar atelectasis or consolidation. Appearances are similar to prior study. No pneumothorax. IMPRESSION: Chronic bilateral pleural effusions with basilar atelectasis or consolidation, greater on the left. No significant change since prior study. Electronically Signed   By: Lucienne Capers M.D.   On: 01/17/2022 19:27    ASSESMENT:     Hematemesis.  Known portal htn, esoph varices and PHG.  Previous GI bleeding and large esophageal varices and GOV 2 gastric varices, total of 38 bands placed as of 06/2021.  Hx post banding ulcer bleed.  Suspect current variceal bleeding, vs source of PHG or PUD.  Latest EGD 09/2021: Remnant grade 1 varices with flattening and extensive scarring  from prior banding.  Did not require additional banding.  Portal hypertensive gastropathy.  Examined duodenum and duodenal bulb normal.    ABL anemia.  Baseline Hgb ~ 10.  Since arrival 8.6.[name] KitchenMarland Kitchen 2 PRBCs... 9.5.  Coagulopathy.  INR 1.4.  Thrombocytopenia.    Hx hepatic hydrothorax.   Previous thoracentesis.  Chronic bil pleural effusions on  current CXR.  TIPS has been considered.  Needs updated echo.     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      Solitary functioning R kidney.  GFR  44.   PLAN   EGD around 1045 today.  Needs updated echocardiogram, will order this.    CBC q 6 hours.      Azucena Freed  01/18/2022, 8:18 AM Phone [phone]

## 2022-01-19 ENCOUNTER — Inpatient Hospital Stay (HOSPITAL_COMMUNITY): Payer: No Typology Code available for payment source

## 2022-01-19 DIAGNOSIS — K922 Gastrointestinal hemorrhage, unspecified: Secondary | ICD-10-CM | POA: Diagnosis not present

## 2022-01-19 DIAGNOSIS — R011 Cardiac murmur, unspecified: Secondary | ICD-10-CM | POA: Diagnosis not present

## 2022-01-19 DIAGNOSIS — J449 Chronic obstructive pulmonary disease, unspecified: Secondary | ICD-10-CM | POA: Diagnosis not present

## 2022-01-19 DIAGNOSIS — Z01818 Encounter for other preprocedural examination: Secondary | ICD-10-CM | POA: Diagnosis not present

## 2022-01-19 LAB — GLUCOSE, CAPILLARY
Glucose-Capillary: 184 mg/dL — ABNORMAL HIGH (ref 70–99)
Glucose-Capillary: 106 mg/dL — ABNORMAL HIGH (ref 70–99)
Glucose-Capillary: 115 mg/dL — ABNORMAL HIGH (ref 70–99)
Glucose-Capillary: 240 mg/dL — ABNORMAL HIGH (ref 70–99)
Glucose-Capillary: 124 mg/dL — ABNORMAL HIGH (ref 70–99)
Glucose-Capillary: 137 mg/dL — ABNORMAL HIGH (ref 70–99)

## 2022-01-19 LAB — PROTIME-INR
INR: 1.2 (ref 0.8–1.2)
Prothrombin Time: 15.4 seconds — ABNORMAL HIGH (ref 11.4–15.2)

## 2022-01-19 LAB — CBC
HCT: 26.7 % — ABNORMAL LOW (ref 39.0–52.0)
HCT: 27.3 % — ABNORMAL LOW (ref 39.0–52.0)
Hemoglobin: 8.8 g/dL — ABNORMAL LOW (ref 13.0–17.0)
Hemoglobin: 8.7 g/dL — ABNORMAL LOW (ref 13.0–17.0)
MCH: 29.5 pg (ref 26.0–34.0)
MCH: 29 pg (ref 26.0–34.0)
MCHC: 33 g/dL (ref 30.0–36.0)
MCHC: 31.9 g/dL (ref 30.0–36.0)
MCV: 89.6 fL (ref 80.0–100.0)
MCV: 91 fL (ref 80.0–100.0)
Platelets: 78 10*3/uL — ABNORMAL LOW (ref 150–400)
Platelets: 86 10*3/uL — ABNORMAL LOW (ref 150–400)
RBC: 2.98 MIL/uL — ABNORMAL LOW (ref 4.22–5.81)
RBC: 3 MIL/uL — ABNORMAL LOW (ref 4.22–5.81)
RDW: 18.1 % — ABNORMAL HIGH (ref 11.5–15.5)
RDW: 18 % — ABNORMAL HIGH (ref 11.5–15.5)
WBC: 3.4 10*3/uL — ABNORMAL LOW (ref 4.0–10.5)
WBC: 3.9 10*3/uL — ABNORMAL LOW (ref 4.0–10.5)
nRBC: 0 % (ref 0.0–0.2)
nRBC: 0 % (ref 0.0–0.2)

## 2022-01-19 LAB — COMPREHENSIVE METABOLIC PANEL
ALT: 48 U/L — ABNORMAL HIGH (ref 0–44)
AST: 37 U/L (ref 15–41)
Albumin: 2.4 g/dL — ABNORMAL LOW (ref 3.5–5.0)
Alkaline Phosphatase: 145 U/L — ABNORMAL HIGH (ref 38–126)
Anion gap: 7 (ref 5–15)
BUN: 31 mg/dL — ABNORMAL HIGH (ref 8–23)
CO2: 24 mmol/L (ref 22–32)
Calcium: 8.6 mg/dL — ABNORMAL LOW (ref 8.9–10.3)
Chloride: 108 mmol/L (ref 98–111)
Creatinine, Ser: 1.48 mg/dL — ABNORMAL HIGH (ref 0.61–1.24)
GFR, Estimated: 50 mL/min — ABNORMAL LOW (ref >60)
Glucose, Bld: 113 mg/dL — ABNORMAL HIGH (ref 70–99)
Potassium: 4.5 mmol/L (ref 3.5–5.1)
Sodium: 139 mmol/L (ref 135–145)
Total Bilirubin: 0.6 mg/dL (ref 0.3–1.2)
Total Protein: 6.8 g/dL (ref 6.5–8.1)

## 2022-01-19 LAB — ECHOCARDIOGRAM COMPLETE
AR max vel: 2.04 cm2
AV Area VTI: 1.72 cm2
AV Area mean vel: 1.94 cm2
AV Mean grad: 9 mmHg
AV Peak grad: 15.4 mmHg
Ao pk vel: 1.96 m/s
Area-P 1/2: 3.72 cm2
Calc EF: 58.5 %
Height: 66 in
P 1/2 time: 228 msec
S' Lateral: 3.4 cm
Single Plane A2C EF: 60.2 %
Single Plane A4C EF: 56.5 %
Weight: 2582.03 oz

## 2022-01-19 LAB — MAGNESIUM: Magnesium: 2.2 mg/dL (ref 1.7–2.4)

## 2022-01-19 MED ORDER — INSULIN ASPART 100 UNIT/ML IJ SOLN
0.0000 [IU] | Freq: Three times a day (TID) | INTRAMUSCULAR | Status: DC
  Administered 2022-01-19 – 2022-01-20 (×2): 2 [IU] via SUBCUTANEOUS
  Administered 2022-01-20: 5 [IU] via SUBCUTANEOUS

## 2022-01-19 MED ORDER — TAMSULOSIN HCL 0.4 MG PO CAPS
0.4000 mg | ORAL_CAPSULE | Freq: Two times a day (BID) | ORAL | Status: DC
  Administered 2022-01-19 – 2022-01-20 (×3): 0.4 mg via ORAL
  Filled 2022-01-19 (×3): qty 1

## 2022-01-19 MED ORDER — SODIUM CHLORIDE 0.9 % IV SOLN
1.0000 g | INTRAVENOUS | Status: DC
  Administered 2022-01-20: 1 g via INTRAVENOUS
  Filled 2022-01-19: qty 10

## 2022-01-19 MED ORDER — MIDODRINE HCL 5 MG PO TABS
10.0000 mg | ORAL_TABLET | Freq: Three times a day (TID) | ORAL | Status: DC
  Administered 2022-01-19 – 2022-01-20 (×4): 10 mg via ORAL
  Filled 2022-01-19 (×4): qty 2

## 2022-01-19 MED ORDER — MONTELUKAST SODIUM 10 MG PO TABS
10.0000 mg | ORAL_TABLET | Freq: Every day | ORAL | Status: DC
  Administered 2022-01-19 – 2022-01-20 (×2): 10 mg via ORAL
  Filled 2022-01-19 (×2): qty 1

## 2022-01-19 MED ORDER — BUPROPION HCL ER (XL) 150 MG PO TB24
150.0000 mg | ORAL_TABLET | Freq: Every day | ORAL | Status: DC
  Administered 2022-01-19 – 2022-01-20 (×2): 150 mg via ORAL
  Filled 2022-01-19 (×2): qty 1

## 2022-01-19 MED ORDER — TRAZODONE HCL 50 MG PO TABS
150.0000 mg | ORAL_TABLET | Freq: Every day | ORAL | Status: DC
  Administered 2022-01-19: 150 mg via ORAL
  Filled 2022-01-19: qty 3

## 2022-01-19 MED ORDER — CARBIDOPA-LEVODOPA 25-100 MG PO TABS
1.0000 | ORAL_TABLET | Freq: Three times a day (TID) | ORAL | Status: DC
  Administered 2022-01-19 – 2022-01-20 (×4): 1 via ORAL
  Filled 2022-01-19 (×4): qty 1

## 2022-01-19 NOTE — Plan of Care (Signed)

## 2022-01-19 NOTE — Anesthesia Postprocedure Evaluation (Signed)
Anesthesia Post Note  Patient: Corey Wood  Procedure(s) Performed: ESOPHAGOGASTRODUODENOSCOPY (EGD) WITH PROPOFOL ESOPHAGEAL BANDING     Patient location during evaluation: PACU Anesthesia Type: General Level of consciousness: sedated and patient cooperative Pain management: pain level controlled Vital Signs Assessment: post-procedure vital signs reviewed and stable Respiratory status: spontaneous breathing Cardiovascular status: stable Anesthetic complications: no   No notable events documented.  Last Vitals:  Vitals:   01/19/22 0600 01/19/22 0713  BP: (!) 114/58   Pulse: 79   Resp: (!) 22   Temp:  36.7 C  SpO2: 100%     Last Pain:  Vitals:   01/19/22 0713  TempSrc: Oral  PainSc:                  Nolon Nations

## 2022-01-19 NOTE — Progress Notes (Signed)
Pt arrived to unit

## 2022-01-19 NOTE — Progress Notes (Signed)
HR in 60's, up to 90's then dropped down to 40's and came back up into 60's MD made aware and EKG obtained.

## 2022-01-19 NOTE — Progress Notes (Signed)
NAME:  Corey Wood, MRN:  [id number], DOB:  11/04/1950, LOS: 2 ADMISSION DATE:  01/17/2022, CONSULTATION DATE:  10/23 REFERRING MD:  Maryan Rued, CHIEF COMPLAINT:  Hematemesis   History of Present Illness:  Corey Wood, is a 71 y.o. male, who presented to the [facility] ED with a chief complaint of hematemesis   They have a pertinent past medical history of NASH cirrhosis with esophageal varices with multiple bandings, portal hypertension, hepatic hydrothorax, pancytopenia, DM 2, hypertension, CKD 3-day, COPD, GERD   Prior to arrival, patient with cough over the past few days, developed hematemesis on 10/22. EMS was called. 527m IVF given.  Patient denies hematemesis, BRBPR, melena.   Initial hemoglobin 8.6, [place] hemoglobin [place] 10 [place] of PRBCs were ordered.  Patient with 2 episodes of hematemesis while in the emergency department.  GI was consulted-patient was started on ceftriaxone, PPI infusion, octreotide.  NG tube was placed.  GI planning to scope a.m. of 10/23.   PCCM was consulted for admission.  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, portal hypertension, hepatic hydrothorax, pancytopenia, DM 2, hypertension, CKD 3-day, COPD, GERD, parkinsons  Significant Hospital Events: Including procedures, antibiotic start and stop dates in addition to other pertinent events   [facility], PCCM consult, 2 U PRBC 10/23 Endoscopy with variceal banding x 4.   Interim History / Subjective:  Feeling okay, no abd pain No further hematemesis Does not remember he had the gastric banding yesterday  Objective   Blood pressure (!) 114/58, pulse 79, temperature 98.6 F (37 C), temperature source Oral, resp. rate (!) 22, height 5' 6"  (1.676 m), weight 73.2 kg, SpO2 100 %.        Intake/Output Summary (Last 24 hours) at 01/19/2022 0710 Last data filed at 01/19/2022 0600 Gross per 24 hour  Intake  1285.88 ml  Output 905 ml  Net 380.88 ml   Filed Weights   01/17/22 1857 01/18/22 1000 01/19/22 0334  Weight: 73.9 kg 73.9 kg 73.2 kg    Examination: General: Well-appearing elderly gentleman.  NAD. HENT: [place]/AT.  MMM. Lungs: Lungs CTAB.  No wheezing or rales. Cardiovascular: RRR.  S1, S2.  No LE edema. Abdomen: Soft.  NT/ND.  Normal BS. Extremities: Warm and dry. Neuro: Awake and alert.  Moves all extremities.  WBC 3.4, Hgb 8.8, platelets 78 K+ 4.5, creatinine 1.48, mag 2.2  Resolved Hospital Problem list   Hematemesis Afib  Assessment & Plan:  Hematemesis Acute blood loss anemia Decompensated cirrhosis Hematemesis secondary to variceal bleeding status post banding 10/23.  Recommend ICU stay due to high risk of rebleeding.  Hgb 9.6 to 8.8, hemodynamically stable -GI following, appreciate recs -Clear liquid diet -Continue Rocephin, Protonix, octreotide -Transfer out of the ICU   New onset A-fib, resolved Suspect secondary to stress of critical illness, hypokalemia. Spontaneously converted to normal sinus overnight. -Continuous telemetry   COPD - PRN albuterol per home regimen   Hx hepatic hydrothorax Chronic bilateral plural effusions Remains on 2 LNC.  No increased work of breathing. -Wean off O2 as able -TTE per GI -Considering TIPS   HX HTN -Continue to hold antihypertensive in the setting of soft BPs   CKD3 Kidney function slowly improving. -Trend BMP/I&O -Avoid neprotoxic drugs as possible.  DM2 -Blood Glucose goal 140-180. -Resume SSI   Parkinsons  -Resume home meds  Best Practice (right click and "Reselect all SmartList Selections" daily)  Diet/type: tubefeeds DVT prophylaxis: not indicated GI prophylaxis: PPI Lines: N/A Foley:  N/A Code Status:  full code Last date of multidisciplinary goals of care discussion [Pending]  Labs   CBC: Recent Labs  Lab 01/17/22 1911 01/18/22 0132 01/18/22 0434 01/18/22 1115 01/18/22 1151  01/19/22 0306  WBC 3.6* 4.0 4.2 5.6 5.0 3.4*  NEUTROABS 2.7  --   --   --   --   --   HGB 8.6* 9.6* 9.5* 13.2 9.6* 8.8*  HCT 26.8* 29.9* 29.7* 39.1 29.4* 26.7*  MCV 96.4 90.3 91.4 95.4 91.6 89.6  PLT 77* 77* 83* 316 88* 78*    Basic Metabolic Panel: Recent Labs  Lab 01/17/22 1911 01/18/22 0434 01/19/22 0306  NA 135 137 139  K 3.9 4.4 4.5  CL 103 105 108  CO2 23 24 24   GLUCOSE 254* 132* 113*  BUN 23 26* 31*  CREATININE 1.69* 1.63* 1.48*  CALCIUM 8.7* 8.7* 8.6*  MG  --  1.6* 2.2  PHOS  --  3.3  --    GFR: Estimated Creatinine Clearance: 40.1 mL/min (A) (by C-G formula based on SCr of 1.48 mg/dL (H)). Recent Labs  Lab 01/17/22 2113 01/18/22 0132 01/18/22 0434 01/18/22 1115 01/18/22 1151 01/19/22 0306  WBC  --    < > 4.2 5.6 5.0 3.4*  LATICACIDVEN 2.9*  --   --   --   --   --    < > = values in this interval not displayed.    Liver Function Tests: Recent Labs  Lab 01/17/22 1911 01/19/22 0306  AST 62* 37  ALT 45* 48*  ALKPHOS 186* 145*  BILITOT 0.9 0.6  PROT 7.1 6.8  ALBUMIN 2.5* 2.4*   Recent Labs  Lab 01/17/22 1911  LIPASE 32   No results for input(s): "AMMONIA" in the last 168 hours.  ABG    Component Value Date/Time   PHART 7.454 (H) 04/29/2020 1625   PCO2ART 36.7 04/29/2020 1625   PO2ART 376 (H) 04/29/2020 1625   HCO3 25.8 04/29/2020 1625   TCO2 27 04/29/2020 1625   O2SAT 100.0 04/29/2020 1625     Coagulation Profile: Recent Labs  Lab 01/17/22 1911  INR 1.4*    Cardiac Enzymes: No results for input(s): "CKTOTAL", "CKMB", "CKMBINDEX", "TROPONINI" in the last 168 hours.  HbA1C: Hgb A1c MFr Bld  Date/Time Value Ref Range Status  01/18/2022 04:34 AM 6.6 (H) 4.8 - 5.6 % Final    Comment:    (NOTE) Pre diabetes:          5.7%-6.4%  Diabetes:              >6.4%  Glycemic control for   <7.0% adults with diabetes   02/26/2021 01:20 PM 6.8 (H) 4.8 - 5.6 % Final    Comment:    (NOTE) Pre diabetes:          5.7%-6.4%  Diabetes:               >6.4%  Glycemic control for   <7.0% adults with diabetes     CBG: Recent Labs  Lab 01/18/22 0816 01/18/22 1152 01/18/22 1904 01/18/22 2328 01/19/22 0317  GLUCAP 147* 131* 198* 137* 106*    Review of Systems:   As in HPI  Past Medical History:  He,  has a past medical history of Allergy, Anxiety, Aortic valve disorder, Arthritis, Asthma, Cataract, Cirrhosis ([place]), CKD (chronic kidney disease), stage III ([place]), COPD (chronic obstructive pulmonary disease) ([place]), Depression, DM (  diabetes mellitus) ([place]), Dyspnea, GERD (gastroesophageal reflux disease), Heart murmur, History of colon polyps, History of COVID-19 (10/14/2020), History of kidney stones, acute pancreatitis (10/2019), Hyperlipidemia, Hypertension, Hypothyroidism, Myelodysplastic syndrome ([place]), Neuromuscular disorder ([place]), Obstructive sleep apnea, Oxygen deficiency, Parkinsonism, Peripheral edema, Peripheral positional vertigo, Sleep apnea, and Stroke ([place]).   Surgical History:   Past Surgical History:  Procedure Laterality Date   ANKLE SURGERY     CHOLECYSTECTOMY     ENDARTERECTOMY Left 04/29/2020   Procedure: CAROTID EXPLORATION removal of  central venous catheter;  Surgeon: Rosetta Posner, MD;  Location: [facility];  Service: Vascular;  Laterality: Left;   ESOPHAGEAL BANDING N/A 04/17/2020   Procedure: ESOPHAGEAL BANDING;  Surgeon: Yetta Flock, MD;  [facility]: [facility];  Service: Gastroenterology;  Laterality: N/A;   ESOPHAGEAL BANDING  04/29/2020   Procedure: ESOPHAGEAL BANDING;  Surgeon: Rush Landmark Telford Nab., MD;  Location: [place];  Service: Gastroenterology;;   ESOPHAGEAL BANDING N/A 06/10/2020   Procedure: ESOPHAGEAL BANDING;  Surgeon: Yetta Flock, MD;  [facility]: [facility];  Service: Gastroenterology;  Laterality: N/A;   ESOPHAGEAL BANDING N/A 11/10/2020   Procedure: ESOPHAGEAL BANDING;  Surgeon: Yetta Flock, MD;  [facility];  Service: Gastroenterology;   Laterality: N/A;   ESOPHAGEAL BANDING N/A 02/10/2021   Procedure: ESOPHAGEAL BANDING;  Surgeon: Jerene Bears, MD;  [facility];  Service: Gastroenterology;  Laterality: N/A;   ESOPHAGEAL BANDING N/A 04/21/2021   Procedure: ESOPHAGEAL BANDING;  Surgeon: Milus Banister, MD;  [facility];  Service: Endoscopy;  Laterality: N/A;   ESOPHAGEAL BANDING N/A 07/16/2021   Procedure: ESOPHAGEAL BANDING;  Surgeon: Ladene Artist, MD;  [facility];  Service: Gastroenterology;  Laterality: N/A;   ESOPHAGOGASTRODUODENOSCOPY N/A 04/25/2020   Procedure: ESOPHAGOGASTRODUODENOSCOPY (EGD);  Surgeon: Carol Ada, MD;  [facility];  Service: Endoscopy;  Laterality: N/A;   ESOPHAGOGASTRODUODENOSCOPY (EGD) WITH PROPOFOL N/A 04/17/2020   Procedure: ESOPHAGOGASTRODUODENOSCOPY (EGD) WITH PROPOFOL;  Surgeon: Yetta Flock, MD;  [facility];  Service: Gastroenterology;  Laterality: N/A;   ESOPHAGOGASTRODUODENOSCOPY (EGD) WITH PROPOFOL N/A 04/29/2020   Procedure: ESOPHAGOGASTRODUODENOSCOPY (EGD) WITH PROPOFOL;  Surgeon: Rush Landmark Telford Nab., MD;  Location: [place];  Service: Gastroenterology;  Laterality: N/A;   ESOPHAGOGASTRODUODENOSCOPY (EGD) WITH PROPOFOL N/A 06/10/2020   Procedure: ESOPHAGOGASTRODUODENOSCOPY (EGD) WITH PROPOFOL;  Surgeon: Yetta Flock, MD;  [facility];  Service: Gastroenterology;  Laterality: N/A;   ESOPHAGOGASTRODUODENOSCOPY (EGD) WITH PROPOFOL N/A 11/10/2020   Procedure: ESOPHAGOGASTRODUODENOSCOPY (EGD) WITH PROPOFOL;  Surgeon: Yetta Flock, MD;  [facility];  Service: Gastroenterology;  Laterality: N/A;   ESOPHAGOGASTRODUODENOSCOPY (EGD) WITH PROPOFOL N/A 02/10/2021   Procedure: ESOPHAGOGASTRODUODENOSCOPY (EGD) WITH PROPOFOL;  Surgeon: Jerene Bears, MD;  [facility];  Service: Gastroenterology;  Laterality: N/A;   ESOPHAGOGASTRODUODENOSCOPY (EGD) WITH PROPOFOL N/A 03/31/2021   Procedure:  ESOPHAGOGASTRODUODENOSCOPY (EGD) WITH PROPOFOL;  Surgeon: Yetta Flock, MD;  [facility];  Service: Gastroenterology;  Laterality: N/A;   ESOPHAGOGASTRODUODENOSCOPY (EGD) WITH PROPOFOL N/A 04/21/2021   Procedure: ESOPHAGOGASTRODUODENOSCOPY (EGD) WITH PROPOFOL;  Surgeon: Milus Banister, MD;  [facility];  Service: Endoscopy;  Laterality: N/A;   ESOPHAGOGASTRODUODENOSCOPY (EGD) WITH PROPOFOL N/A 07/16/2021   Procedure: ESOPHAGOGASTRODUODENOSCOPY (EGD) WITH PROPOFOL;  Surgeon: Ladene Artist, MD;  [facility];  Service: Gastroenterology;  Laterality: N/A;   ESOPHAGOGASTRODUODENOSCOPY (EGD) WITH PROPOFOL N/A 10/15/2021   Procedure: ESOPHAGOGASTRODUODENOSCOPY (EGD) WITH PROPOFOL;  Surgeon: Yetta Flock, MD;  [facility];  Service: Gastroenterology;  Laterality: N/A;  EYE SURGERY     HERNIA REPAIR     IR PARACENTESIS  09/18/2020   IR RADIOLOGIST EVAL & MGMT  10/22/2021   IR THORACENTESIS ASP PLEURAL SPACE W/IMG GUIDE  10/05/2021   KIDNEY STONE SURGERY     WISDOM TOOTH EXTRACTION       Social History:   reports that he has never smoked. He has never been exposed to tobacco smoke. He has never used smokeless tobacco. He reports that he does not currently use alcohol. He reports that he does not currently use drugs.   Family History:  His family history includes Liver cancer in his mother. There is no history of Colon cancer, Stomach cancer, Colon polyps, Esophageal cancer, or Rectal cancer.   Allergies Allergies  Allergen Reactions   Lisinopril Cough     Home Medications  Prior to Admission medications   Medication Sig Start Date End Date Taking? Authorizing Provider  acetaminophen (TYLENOL) 325 MG tablet Take 325 mg by mouth every 6 (six) hours as needed for moderate pain.   Yes [provider]  albuterol (VENTOLIN HFA) 108 (90 Base) MCG/ACT inhaler Inhale 1-2 puffs into the lungs every 6 (six) hours as needed for wheezing  or shortness of breath.   Yes [provider]  ARTIFICIAL SALIVA MT Use as directed 1 spray in the mouth or throat as needed (Dry mouth). MOUTH COAT 10/26/20  Yes [provider]  buPROPion (WELLBUTRIN XL) 150 MG 24 hr tablet Take 150 mg by mouth daily.   Yes [provider]  carbidopa-levodopa (SINEMET IR) 25-100 MG tablet Take 1 tablet by mouth in the morning, at noon, and at bedtime. 06/30/21  Yes [provider]  carboxymethylcellulose (REFRESH TEARS) 0.5 % SOLN Place 1 drop into both eyes 3 (three) times daily as needed (dry eyes).   Yes [provider]  fluticasone (FLONASE) 50 MCG/ACT nasal spray Place 1 spray into both nostrils daily as needed for allergies.   Yes [provider]  furosemide (LASIX) 20 MG tablet Take 20 mg by mouth daily.   Yes [provider]  guaiFENesin (DIABETIC TUSSIN EX) 100 MG/5ML liquid Take 10 mLs by mouth every 4 (four) hours as needed for cough or to loosen phlegm.   Yes [provider]  insulin glargine (LANTUS) 100 unit/mL SOPN Inject 16 Units into the skin daily.   Yes [provider]  ipratropium (ATROVENT) 0.03 % nasal spray Place 1 spray into both nostrils at bedtime.   Yes [provider]  lactulose (CHRONULAC) 10 GM/15ML solution Take 15 mLs (10 g total) by mouth 3 (three) times daily as needed for mild constipation. 10/21/21  Yes Armbruster, Carlota Raspberry, MD  latanoprost (XALATAN) 0.005 % ophthalmic solution Place 1 drop into both eyes at bedtime. 08/20/20  Yes [provider]  lidocaine (LIDODERM) 5 % Place 1 patch onto the skin daily as needed (pain). Remove & Discard patch within 12 hours or as directed by MD   Yes [provider]  loratadine (CLARITIN) 10 MG tablet Take 10 mg by mouth daily as needed for allergies. 09/04/20  Yes [provider]  meclizine (ANTIVERT) 25 MG tablet Take 25 mg by mouth 2 (two) times daily as needed for dizziness.   Yes  [provider]  midodrine (PROAMATINE) 10 MG tablet Take 1 tablet (10 mg total) by mouth 3 (three) times daily. 10/21/21  Yes Armbruster, Carlota Raspberry, MD  montelukast (SINGULAIR) 10 MG tablet Take 10 mg by  mouth daily. 05/22/20  Yes [provider]  Multiple Vitamin (MULTIVITAMIN WITH MINERALS) TABS tablet Take 1 tablet by mouth daily.   Yes [provider]  pantoprazole (PROTONIX) 40 MG tablet Take 40 mg by mouth daily with breakfast.   Yes [provider]  Phenyleph-Chlorphen-Hydrocod (COUGHTUSS PO) Take 5 mLs by mouth at bedtime.   Yes [provider]  spironolactone (ALDACTONE) 100 MG tablet Take 50 mg by mouth every other day.   Yes [provider]  tamsulosin (FLOMAX) 0.4 MG CAPS capsule Take 0.4 mg by mouth 2 (two) times daily.   Yes [provider]  traZODone (DESYREL) 150 MG tablet Take 75 mg by mouth at bedtime as needed for sleep. 09/28/19  Yes [provider]     Critical care time: 54

## 2022-01-19 NOTE — Evaluation (Signed)
Physical Therapy Evaluation Patient Details Name: AAYANSH CODISPOTI MRN: [id number] DOB: 1950/12/22 Today's Date: 01/19/2022  History of Present Illness  Pt is 71 yo male admitted 01/17/22 with hematemesis, endoscopy with variceal banding x 4 on 01/18/22.  Pt with hx of NASH cirrhosis with esophageal varices with multiple bandings, portal HTN, hepatic hydrothorax, pancytopenia, DM2, HTN, CKD, COPD, GERD, parkinsonism  Clinical Impression  Pt admitted with above diagnosis. At baseline, pt able to ambulate short community distances with rollator, does have hx of falls, has assist with ADLs, and was getting [place] therapy.  Today, pt with mild disorientation but able to follow all commands and participate with therapy.  He was min A for transfers and to ambulate 150' with RW.  Pt's mobility improved throughout session - reports not OOB since procedure.  Pt with support from family at home and all necessary DME.  Expected to progress well. Pt currently with functional limitations due to the deficits listed below (see PT Problem List). Pt will benefit from skilled PT to increase their independence and safety with mobility to allow discharge to the venue listed below.          Recommendations for follow up therapy are one component of a multi-disciplinary discharge planning process, led by the attending physician.  Recommendations may be updated based on patient status, additional functional criteria and insurance authorization.  Follow Up Recommendations Home health PT      Assistance Recommended at Discharge Intermittent Supervision/Assistance  Patient can return home with the following  A little help with walking and/or transfers;A little help with bathing/dressing/bathroom;Assistance with cooking/housework;Help with stairs or ramp for entrance    Equipment Recommendations None recommended by PT  Recommendations for Other Services       Functional Status Assessment Patient has had a recent decline  in their functional status and demonstrates the ability to make significant improvements in function in a reasonable and predictable amount of time.     Precautions / Restrictions Precautions Precautions: Fall      Mobility  Bed Mobility Overal bed mobility: Needs Assistance Bed Mobility: Supine to Sit, Sit to Supine     Supine to sit: Min assist Sit to supine: Min assist   General bed mobility comments: increased time; use of rail    Transfers Overall transfer level: Needs assistance Equipment used: Rolling walker (2 wheels) Transfers: Sit to/from Stand Sit to Stand: Min assist           General transfer comment: Min A from bed and bsc with cues for hand placement    Ambulation/Gait Ambulation/Gait assistance: Min assist, Min guard Gait Distance (Feet): 150 Feet Assistive device: Rolling walker (2 wheels) Gait Pattern/deviations: Step-through pattern, Decreased stride length, Trunk flexed Gait velocity: decreased     General Gait Details: Initially unsteady requiring min A to steady but progressed to min guard  Science writer    Modified Rankin (Stroke Patients Only)       Balance Overall balance assessment: Needs assistance, History of Falls Sitting-balance support: No upper extremity supported Sitting balance-Leahy Scale: Good     Standing balance support: Bilateral upper extremity supported, Reliant on assistive device for balance Standing balance-Leahy Scale: Poor Standing balance comment: steady with RW                             Pertinent Vitals/Pain Pain Assessment Pain Assessment: No/denies  pain    Home Living Family/patient expects to be discharged to:: Private residence Living Arrangements: Spouse/significant other Available Help at Discharge: Family;Available PRN/intermittently Type of Home: House Home Access: Ramped entrance       Home Layout: One level Home Equipment: Rollator (4  wheels);Rolling Walker (2 wheels);Wheelchair - manual;Cane - single point;Electric scooter;Shower seat - built in;Grab bars - tub/shower;BSC/3in1      Prior Function Prior Level of Function : Needs assist;History of Falls (last six months)             Mobility Comments: Uses rollator short community distances; was getting HHPT; has had falls at home ADLs Comments: Pt wears depends; Family assist with ADLs due to poor balance when leaning forward     Hand Dominance        Extremity/Trunk Assessment   Upper Extremity Assessment Upper Extremity Assessment: Generalized weakness    Lower Extremity Assessment Lower Extremity Assessment: Generalized weakness (needing to get to restroom so did not MMT; demonstrating at least 3/5 throughout and no focal deficits)    Cervical / Trunk Assessment Cervical / Trunk Assessment: Kyphotic  Communication   Communication: No difficulties  Cognition Arousal/Alertness: Awake/alert Behavior During Therapy: WFL for tasks assessed/performed Overall Cognitive Status: Impaired/Different from baseline Area of Impairment: Orientation                 Orientation Level: Disoriented to, Situation             General Comments: Pt unaware of situation and some difficulty providing accurate PLOF (wife correcting).  Followed basic commands.  Aware it was his friend's birthday and that he wants to be out by Friday to go to high school championship football game.        General Comments      Exercises     Assessment/Plan    PT Assessment Patient needs continued PT services  PT Problem List Decreased strength;Decreased mobility;Decreased safety awareness;Decreased cognition;Decreased activity tolerance;Decreased balance;Decreased knowledge of use of DME       PT Treatment Interventions DME instruction;Therapeutic activities;Modalities;Gait training;Therapeutic exercise;Patient/family education;Stair training;Balance training;Functional  mobility training    PT Goals (Current goals can be found in the Care Plan section)  Acute Rehab PT Goals Patient Stated Goal: return home by Friday to go to football game PT Goal Formulation: With patient/family Time For Goal Achievement: 02/02/22 Potential to Achieve Goals: Good    Frequency Min 3X/week     Co-evaluation               AM-PAC PT "6 Clicks" Mobility  Outcome Measure Help needed turning from your back to your side while in a flat bed without using bedrails?: A Little Help needed moving from lying on your back to sitting on the side of a flat bed without using bedrails?: A Little Help needed moving to and from a bed to a chair (including a wheelchair)?: A Little Help needed standing up from a chair using your arms (e.g., wheelchair or bedside chair)?: A Little Help needed to walk in hospital room?: A Little Help needed climbing 3-5 steps with a railing? : A Little 6 Click Score: 18    End of Session Equipment Utilized During Treatment: Gait belt Activity Tolerance: Patient tolerated treatment well Patient left: in bed;with call bell/phone within reach;with bed alarm set;with family/visitor present Nurse Communication: Mobility status PT Visit Diagnosis: Muscle weakness (generalized) (M62.81);Other abnormalities of gait and mobility (R26.89)    Time: 1308-6578 PT Time Calculation (min) (ACUTE ONLY):  29 min   Charges:   PT Evaluation $PT Eval Low Complexity: 1 Low PT Treatments $Gait Training: 8-22 mins        Abran Richard, PT Acute Rehab [facility] [place] 01/19/2022, 5:36 PM

## 2022-01-19 NOTE — Progress Notes (Signed)
  Echocardiogram 2D Echocardiogram has been performed.  Bobbye Charleston 01/19/2022, 3:02 PM

## 2022-01-19 NOTE — Plan of Care (Signed)
  Problem: Education: Goal: Ability to describe self-care measures that may prevent or decrease complications (Diabetes Survival Skills Education) will improve Outcome: Completed/Met

## 2022-01-19 NOTE — Progress Notes (Addendum)
Daily Rounding Note  01/19/2022, 8:30 AM  LOS: 2 days   SUBJECTIVE:   Chief complaint:    esophageal variceal bleed,  s/p banding.  Blood loss anemia.  Cirrhosis.    Blood pressures soft: 1 teens- 120/50s to 60s.  Heart rates 50s to 70s.  Pt is hungry, asking to have something by mouth.  No N/V.  No stools since before  EGD.  No  abd pain.  No dyspnea.      OBJECTIVE:         Vital signs in last 24 hours:    Temp:  [97.6 F (36.4 C)-99.1 F (37.3 C)] 98 F (36.7 C) (10/24 0713) Pulse Rate:  [53-115] 79 (10/24 0600) Resp:  [10-26] 22 (10/24 0600) BP: (101-139)/(56-94) 114/58 (10/24 0600) SpO2:  [92 %-100 %] 100 % (10/24 0600) Weight:  [73.2 kg-73.9 kg] 73.2 kg (10/24 0334) Last BM Date :  (pta) Filed Weights   01/17/22 1857 01/18/22 1000 01/19/22 0334  Weight: 73.9 kg 73.9 kg 73.2 kg   MELD 3.0: 15 at 01/19/2022  MELD-Na: 14 at 01/19/2022      General: looks better   Heart: RRR w murmer Chest: clear in front, no dyspnea or cough Abdomen: soft, ND, NT.  Active BS  Extremities: no CCE Neuro/Psych:  oriented to place, self, year.  Some resting tremor on RUE and bil LE but no asterixis.    Intake/Output from previous day: 10/23 0701 - 10/24 0700 In: 1285.9 [I.V.:1285.9] Out: 905 [Urine:900; Blood:5]  Intake/Output this shift: No intake/output data recorded.  Lab Results: Recent Labs    01/18/22 1115 01/18/22 1151 01/19/22 0306  WBC 5.6 5.0 3.4*  HGB 13.2 9.6* 8.8*  HCT 39.1 29.4* 26.7*  PLT 316 88* 78*   BMET Recent Labs    01/17/22 1911 01/18/22 0434 01/19/22 0306  NA 135 137 139  K 3.9 4.4 4.5  CL 103 105 108  CO2 23 24 24   GLUCOSE 254* 132* 113*  BUN 23 26* 31*  CREATININE 1.69* 1.63* 1.48*  CALCIUM 8.7* 8.7* 8.6*   LFT Recent Labs    01/17/22 1911 01/19/22 0306  PROT 7.1 6.8  ALBUMIN 2.5* 2.4*  AST 62* 37  ALT 45* 48*  ALKPHOS 186* 145*  BILITOT 0.9 0.6   PT/INR Recent  Labs    01/17/22 1911  LABPROT 16.6*  INR 1.4*   Hepatitis Panel No results for input(s): "HEPBSAG", "HCVAB", "HEPAIGM", "HEPBIGM" in the last 72 hours.  Studies/Results: DG Abdomen 1 View  Result Date: 01/17/2022 CLINICAL DATA:  Nasogastric tube placement EXAM: ABDOMEN - 1 VIEW COMPARISON:  None Available. FINDINGS: Nasogastric tube tip overlies the mid body of the stomach. There is moderate gaseous distension of the visualized stomach. Moderate left pleural effusion is present. Small right pleural effusion. IMPRESSION: Nasogastric tube tip within the mid body of the stomach. Electronically Signed   By: Fidela Salisbury M.D.   On: 01/17/2022 20:45   DG Chest Port 1 View  Result Date: 01/17/2022 CLINICAL DATA:  Hematemesis EXAM: PORTABLE CHEST 1 VIEW COMPARISON:  12/08/2021 FINDINGS: Cardiac enlargement. No vascular congestion. Small right and moderate left pleural effusions with basilar atelectasis or consolidation. Appearances are similar to prior study. No pneumothorax. IMPRESSION: Chronic bilateral pleural effusions with basilar atelectasis or consolidation, greater on the left. No significant change since prior study. Electronically Signed   By: Lucienne Capers M.D.   On: 01/17/2022 19:27    Scheduled  Meds:  Chlorhexidine Gluconate Cloth  6 each Topical Daily   insulin aspart  0-9 Units Subcutaneous Q4H   pantoprazole (PROTONIX) IV  40 mg Intravenous Q12H   Continuous Infusions:  sodium chloride     cefTRIAXone (ROCEPHIN)  IV     octreotide (SANDOSTATIN) 500 mcg in sodium chloride 0.9 % 250 mL (2 mcg/mL) infusion 50 mcg/hr (01/19/22 0500)   PRN Meds:.mouth rinse   ASSESMENT:   Cryptogenic cirrhosis.  MELD 17  Hematemesis. 01/18/2022 EGD with large esophageal varices.  Some with red wale signs.  4 bands were placed.  1 varix bled following band application but eventually subsided.  There was scarring at sites of previous band applications.  Gastritis and portal hypertensive  gastropathy noted. Continues on Octreotide gtt (finishes 72 hours at 1915 tmrw),  bid IV Protonix, Rocephin day 3    ABL anemia. 2 PRBCs so far.  Hgb 8.8.    Coagulopathy.  INR 2 d ago was 1.4.    Thrombocytopenia.  Platelets 78.     Hx hepatic hydrothorax, prior thoracentesis.  Chronic bilateral pleural effusions currently.  TIPS has been a consideration.  Solitary functioning R kidney.   AKI improved.   GFR 50.     PLAN     Leave Protonix 40 mg IV bid in place, complete 72 h Octreotide tmrw, complete 5 to 7 days of Rocephin.      2 D echo ordered, to be performed.  This as part of wup in case TIPS pursued.      Switched CBC to q 12.  INR w next lab draw.      Azucena Freed  01/19/2022, 8:30 AM Phone [place] Attending   I have taken an interval history, reviewed the chart and examined the patient. I agree with the Advanced Practitioner's note, impression and recommendations.    He is improved.  Plan for Abd [place] and CXR tomorrow  If ok can go to floor and we can also advance diet tomorrow if ok  Outpatient TIPS eval if continues to do ok  Gatha Mayer, MD, [facility] See [name] on call - gastroenterology for best contact person 01/19/2022 3:53 PM

## 2022-01-20 ENCOUNTER — Inpatient Hospital Stay (HOSPITAL_COMMUNITY): Payer: No Typology Code available for payment source

## 2022-01-20 ENCOUNTER — Encounter (HOSPITAL_COMMUNITY): Payer: Self-pay | Admitting: Internal Medicine

## 2022-01-20 DIAGNOSIS — K920 Hematemesis: Secondary | ICD-10-CM

## 2022-01-20 LAB — CBC
HCT: 27.4 % — ABNORMAL LOW (ref 39.0–52.0)
Hemoglobin: 8.9 g/dL — ABNORMAL LOW (ref 13.0–17.0)
MCH: 29.8 pg (ref 26.0–34.0)
MCHC: 32.5 g/dL (ref 30.0–36.0)
MCV: 91.6 fL (ref 80.0–100.0)
Platelets: 76 10*3/uL — ABNORMAL LOW (ref 150–400)
RBC: 2.99 MIL/uL — ABNORMAL LOW (ref 4.22–5.81)
RDW: 17.4 % — ABNORMAL HIGH (ref 11.5–15.5)
WBC: 2.2 10*3/uL — ABNORMAL LOW (ref 4.0–10.5)
nRBC: 0 % (ref 0.0–0.2)

## 2022-01-20 LAB — GLUCOSE, CAPILLARY
Glucose-Capillary: 133 mg/dL — ABNORMAL HIGH (ref 70–99)
Glucose-Capillary: 222 mg/dL — ABNORMAL HIGH (ref 70–99)

## 2022-01-20 LAB — PROTIME-INR
INR: 1.1 (ref 0.8–1.2)
Prothrombin Time: 14.3 seconds (ref 11.4–15.2)

## 2022-01-20 MED ORDER — SENNOSIDES-DOCUSATE SODIUM 8.6-50 MG PO TABS: 1.0000 | ORAL_TABLET | Freq: Every evening | ORAL | Status: DC | PRN

## 2022-01-20 MED ORDER — PANTOPRAZOLE SODIUM 40 MG PO TBEC: 40.0000 mg | DELAYED_RELEASE_TABLET | Freq: Every day | ORAL | Status: DC

## 2022-01-20 MED ORDER — ACETAMINOPHEN 325 MG PO TABS: 650.0000 mg | ORAL_TABLET | Freq: Four times a day (QID) | ORAL | Status: DC | PRN

## 2022-01-20 MED ORDER — METOPROLOL TARTRATE 5 MG/5ML IV SOLN: 5.0000 mg | INTRAVENOUS | Status: DC | PRN

## 2022-01-20 MED ORDER — GUAIFENESIN 100 MG/5ML PO LIQD: 5.0000 mL | ORAL | Status: DC | PRN

## 2022-01-20 MED ORDER — IPRATROPIUM-ALBUTEROL 0.5-2.5 (3) MG/3ML IN SOLN: 3.0000 mL | RESPIRATORY_TRACT | Status: DC | PRN

## 2022-01-20 MED ORDER — HYDRALAZINE HCL 20 MG/ML IJ SOLN: 10.0000 mg | INTRAMUSCULAR | Status: DC | PRN

## 2022-01-20 NOTE — Progress Notes (Addendum)
Daily Rounding Note  01/20/2022, 11:40 AM  LOS: 3 days   SUBJECTIVE:   Chief complaint:    Hematemesis, acute upper GI bleed from esophageal varices.  Status post banding.  Blood loss anemia.  Cirrhosis.  Patient transferred to floor out of ICU yesterday. He feels well.  No N/V.  No bowel movements overnight or this morning. Doing well walking in the hallway with PT.  Vital signs w isolated drops BP (93/76 at 3 PM yest), tachypnea hig 20s to 30 yest afternoon.isolated bradycardia to 53 this AM.   Normotensive and no tachypnea for > 12 hours.    Wife informs me pt really wants to go home today or at least get home to attend Friday PM football game of [place], [place] and historically big rivalry.     OBJECTIVE:         Vital signs in last 24 hours:    Temp:  [97.4 F (36.3 C)-98.4 F (36.9 C)] 97.4 F (36.3 C) (10/25 0832) Pulse Rate:  [53-98] 63 (10/25 0832) Resp:  [16-30] 16 (10/25 0832) BP: (93-123)/(54-76) 119/73 (10/25 0832) SpO2:  [92 %-96 %] 93 % (10/25 0832) Last BM Date : 01/19/22 Filed Weights   01/17/22 1857 01/18/22 1000 01/19/22 0334  Weight: 73.9 kg 73.9 kg 73.2 kg   General: Mildly chronically ill-appearing.  Comfortable. Heart: Irregularly irregular.  Rate on telemetry in the 70s. Chest: Fine crackles in the bases bilaterally.  No dyspnea.  No cough Abdomen: Soft.  Not tender or distended.  Normal bowel sounds but hypoactive. Extremities: No CCE. Neuro/Psych: Oriented x3.  Slight psychomotor slowing with speech.  No asterixis.  Fine resting tremor in the hands.    Intake/Output from previous day: 10/24 0701 - 10/25 0700 In: 1788.8 [P.O.:1135; I.V.:553.8; IV Piggyback:100] Out: 250 [Urine:250]  Intake/Output this shift: Total I/O In: 220 [P.O.:220] Out: 400 [Urine:400]  Lab Results: Recent Labs    01/19/22 0306 01/19/22 1623 01/20/22 0827  WBC 3.4* 3.9*  2.2*  HGB 8.8* 8.7* 8.9*  HCT 26.7* 27.3* 27.4*  PLT 78* 86* 76*   BMET Recent Labs    01/17/22 1911 01/18/22 0434 01/19/22 0306  NA 135 137 139  K 3.9 4.4 4.5  CL 103 105 108  CO2 23 24 24   GLUCOSE 254* 132* 113*  BUN 23 26* 31*  CREATININE 1.69* 1.63* 1.48*  CALCIUM 8.7* 8.7* 8.6*   LFT Recent Labs    01/17/22 1911 01/19/22 0306  PROT 7.1 6.8  ALBUMIN 2.5* 2.4*  AST 62* 37  ALT 45* 48*  ALKPHOS 186* 145*  BILITOT 0.9 0.6   PT/INR Recent Labs    01/19/22 1623 01/20/22 0753  LABPROT 15.4* 14.3  INR 1.2 1.1     Studies/Results: US Abdomen Complete  Result Date: 01/19/2022 CLINICAL DATA:  Cirrhosis EXAM: ABDOMEN ULTRASOUND COMPLETE COMPARISON:  09/01/2021 FINDINGS: Gallbladder: Cholecystectomy. Common bile duct: Diameter: 6 mm Liver: Heterogeneous increased liver echotexture consistent with known cirrhosis. No focal parenchymal liver abnormality. Stable nodularity of the liver capsule. Portal vein is patent on color Doppler imaging with normal direction of blood flow towards the liver. IVC: No abnormality visualized. Pancreas: Visualized portion unremarkable. Spleen: The spleen is enlarged measuring 15.4 cm in anterior-posterior dimension. Right Kidney: Length: 11.2 cm. Echogenicity within normal limits. No mass or hydronephrosis visualized. Left Kidney: Not well visualized. Abdominal aorta: No aneurysm visualized. Other findings: Small amount of loculated ascites within the left  upper quadrant. Incidental small bilateral pleural effusions. IMPRESSION: 1. Stable findings of cirrhosis.  No focal liver abnormality. 2. Splenomegaly, compatible with portal venous hypertension. 3. Small amount of loculated ascites left upper quadrant. 4. Incidental small bilateral pleural effusions. Electronically Signed   By: Randa Ngo M.D.   On: 01/19/2022 19:47   ECHOCARDIOGRAM COMPLETE  Result Date: 01/19/2022  IMPRESSIONS  1. Left ventricular ejection fraction, by estimation, is  55 to 60%. The left ventricle has normal function. The left ventricle has no regional wall motion abnormalities. Left ventricular diastolic parameters were normal.  2. Right ventricular systolic function is normal. The right ventricular size is mildly enlarged. There is normal pulmonary artery systolic pressure. The estimated right ventricular systolic pressure is 16.0 mmHg.  3. Right atrial size was mildly dilated.  4. The mitral valve is grossly normal. Trivial mitral valve regurgitation. No evidence of mitral stenosis.  5. The aortic valve is tricuspid. Aortic valve regurgitation is mild. Mild aortic valve stenosis. Aortic valve area, by VTI measures 1.72 cm. Aortic valve mean gradient measures 9.0 mmHg. Aortic valve Vmax measures 1.96 m/s.  6. There is moderate dilatation of the ascending aorta, measuring 43 mm.  7. The inferior vena cava is normal in size with greater than 50% respiratory variability, suggesting right atrial pressure of 3 mmHg. Comparison(s): Changes from prior study are noted. Aortic stenosis is now mild. Electronically signed by Eleonore Chiquito MD Signature Date/Time: 01/19/2022/3:09:52 PM    Final     Scheduled Meds:  buPROPion  150 mg Oral Daily   carbidopa-levodopa  1 tablet Oral TID   Chlorhexidine Gluconate Cloth  6 each Topical Daily   insulin aspart  0-15 Units Subcutaneous TID WC   midodrine  10 mg Oral TID WC   montelukast  10 mg Oral Daily   [START ON 01/21/2022] pantoprazole  40 mg Oral Q0600   tamsulosin  0.4 mg Oral BID   traZODone  150 mg Oral QHS   Continuous Infusions:  sodium chloride     cefTRIAXone (ROCEPHIN)  IV 1 g (01/20/22 0837)   octreotide (SANDOSTATIN) 500 mcg in sodium chloride 0.9 % 250 mL (2 mcg/mL) infusion 50 mcg/hr (01/20/22 0134)   PRN Meds:.acetaminophen, guaiFENesin, hydrALAZINE, ipratropium-albuterol, metoprolol tartrate, mouth rinse, senna-docusate   ASSESMENT:   Hematemesis.  Resolved.   04/20/2021 EGD with banding of large  esophageal varices, some with red wale signs of recent bleeding.  Gastritis.  Portal hypertensive gastropathy. 72-hour octreotide drip finishes today at 44.  Rocephin day 4.  Protonix 40 IV bid   Blood loss anemia.Hgb 8.6.[name] KitchenMarland Kitchen 2 PRBCs.. 9.6... 8.7.   Cryptogenic cirrhosis, likely due to NASH.  MELD 17. Ultrasound yesterday: cirrhosis, no suspicious liver lesions, splenomegaly, small loculated LUQ ascites, small bil pleural effusions.    Coagulopathy. Resolved.  INR 1.4.. 1.1.    Thrombocytopenia. Improved.  Platelets 77... 86.  Splenomegaly on [place].      Hx hepatic hydrothorax, previous thoracentesis.  Currently with chronic L > R  pleural effusions.  TIPS has been a consideration.  Completed echocardiogram with LVEF 55 to 60%, no clinically significant valvular issues.   PLAN     Advanced to carb mod diet.      Ok to go home this afternoon, ok to stop Octreotide sooner than 72 h mark.      Protonix 40 mg po daily as was taking PTA.  No need for abx at discharge.      Keep 11/1 appt w Dr Havery Moros.  Azucena Freed  01/20/2022, 11:40 AM Phone [place] Attending   I have taken an interval history, reviewed the chart and examined the patient. I agree with the Advanced Practitioner's note, impression and recommendations.    He has improved and stabilized so I think dc later today is reasonable.  Gatha Mayer, MD, [facility] See [name] on call - gastroenterology for best contact person 01/20/2022 11:46 AM

## 2022-01-20 NOTE — TOC Transition Note (Signed)
Transition of Care [facility]) - CM/SW Discharge Note   Patient Details  Name: Corey Wood MRN: [id number] Date of Birth: September 22, 1950  [facility] CM/SW Contact:  Tom-Johnson, Renea Ee, RN Phone Number: 01/20/2022, 1:49 PM   Clinical Narrative:     Patient is scheduled for discharge today. Admitted for Hematemesis, resolved at this time, hgb is 8.9.  From home with wife and has three supportive children. Has a cane, walker, w/c shower bench and grab bars. Has handicapped bathroom.  Patient is a [place] has been notified about his admission.  CM notified Estella Husk [phone] ex [phone]) of Home health PT recommendation. Resumption of care called in to [place] voiced acceptance, info on AVS. Wife to transport at discharge. No further TOC needs noted.    Final next level of care: [place] Barriers to Discharge: Barriers Resolved   Patient Goals and CMS Choice Patient states their goals for this hospitalization and ongoing recovery are:: To return home CMS Medicare.gov Compare Post Acute Care list provided to:: Patient Choice offered to / list presented to : Patient, Spouse  Discharge Placement                Patient to be transferred to facility by: Wife      Discharge Plan and Services                DME Arranged: N/A DME Agency: NA       HH Arranged: PT [place] Agency: [place] ([facility]) Date [place]: 01/20/22 Time [place]: 1330 Representative spoke with at [place]: [place] ([place]) Interventions     Readmission Risk Interventions    05/02/2020    2:41 PM  Readmission Risk Prevention Plan  Transportation Screening Complete  PCP or Specialist Appt within 3-5 Days Complete  HRI or [place] Complete  Social Work Consult for [place] Planning/Counseling Complete  Palliative Care Screening Not Applicable  Medication  Review Press photographer) Referral to Pharmacy

## 2022-01-20 NOTE — Consult Note (Signed)
   [facility] Inpatient Consult   01/20/2022  CADELL GABRIELSON 1950-12-07 [id number]  [place] Organization [ACO] Patient: [employer] Medicare  Primary Care Provider:  Cl[facility], [facility]   Patient screened for  care gaps and care coordination needs hospitalization with noted high risk score for unplanned readmission risk and  to assess for potential [place] Management service needs for post hospital transition.  Review of patient's medical record reveals patient is for home with Home Health.    Plan:  Called the phone number provided to follow up on TOC follow up and AWV for GAPs. Not able to leave message but able to leave phone number for a return call.  For questions contact:   Natividad Brood, RN BSN [place] [facility] Liaison  [phone] business mobile phone Toll free office [phone]  Fax number: [phone] [email or web address]

## 2022-01-20 NOTE — Progress Notes (Signed)
Nursing dc note  Dc instructions reviewed with wife sharon verbalized understanding. All belongings given to family. Ccmd notified of dc order. Piv sites unremarkable.

## 2022-01-20 NOTE — Progress Notes (Signed)
DISCHARGE NOTE HOME Corey Wood to be discharged Home per MD order. Discussed prescriptions and follow up appointments with the patient. Prescriptions given to patient; medication list explained in detail. Patient verbalized understanding.  Skin clean, dry and intact without evidence of skin break down, no evidence of skin tears noted. IV catheter discontinued intact. Site without signs and symptoms of complications. Dressing and pressure applied. Pt denies pain at the site currently. No complaints noted.  Patient free of lines, drains, and wounds.   An After Visit Summary (AVS) was printed and given to the patient. Patient escorted via wheelchair, and discharged home via private auto.  Jawuan Robb S Alisandra Son, RN

## 2022-01-20 NOTE — Discharge Summary (Signed)
Physician Discharge Summary  Corey Wood BHA:[id number] DOB: 1950-08-19 DOA: 01/17/2022  PCP: [facility]  Admit date: 01/17/2022 Discharge date: 01/20/2022  Admitted From: Home Disposition:  Home  Recommendations for Outpatient Follow-up:  Follow up with PCP in 1-2 weeks Please obtain BMP/CBC in one week your next doctors visit.  Continue daily Protonix and follow-up outpatient GI on 11/1.  Arranged by their service.   Discharge Condition: Stable CODE STATUS: Full code Diet recommendation: 2 g salt  Brief/Interim Summary: 71 year old with history of [name] cirrhosis with EV requiring multiple banding, portal hypertension, hepatic hydrothorax, pancytopenia, DM 2, HTN, CKD stage IIIa, COPD, GERD, Parkinson's comes to the hospital with complaints of cough for several days and hematemesis.  Initially patient was started on PPI drip, octreotide and GI was consulted.  Underwent endoscopy and variceal banding X4 and PRBC transfusion on 10/23.  Hemoglobin today remains stable, GI recommends discharging the patient on daily PPI.  No need for antibiotics okay to stop octreotide PT recommends home health therefore arrangements will be made.  Medically stable for discharge as patient really wishes to go home as well.   Assessment & Plan:  Principal Problem:   Hematemesis Active Problems:   Liver cirrhosis secondary to NASH [facility])   Acute upper GI bleed   Chronic obstructive pulmonary disease (HCC)   Stage 3 chronic kidney disease (HCC)   Hematemesis secondary to esophageal variceal bleeding Decompensated Nash cirrhosis, hepatic hydrothorax Acute blood loss anemia - Secondary to variceal bleeding status post banding on 10/23.  Also required PRBC transfusion, now hemoglobin is stable.  Hemoglobin now remained stable, tolerating oral.  GI recommends okay to discharge the patient on daily PPI, no need for antibiotics and okay to stop octreotide drip.   New onset atrial  fibrillation - Likely an acute illness.  This appears to have resolved.  Not a candidate for anticoagulation due to bleeding.  Follow-up outpatient PCP   COPD - As needed bronchodilators.  Singulair   Diabetes mellitus type 2 - Resume outpatient regimen and   CKD stage IIIa - Renal function stable.  Continue to monitor   History of Parkinson's disease - Continue Sinemet   Chronic hypotension - On midodrine outpatient      Discharge Diagnoses:  Principal Problem:   Hematemesis Active Problems:   Liver cirrhosis secondary to NASH ([place])   Acute upper GI bleed   Chronic obstructive pulmonary disease (HCC)   Stage 3 chronic kidney disease ([place])      Consultations: Gastro neurology  Subjective: Patient really wishes to go home today.  No complaints doing well  Discharge Exam: Vitals:   01/20/22 0800 01/20/22 0832  BP: 119/73 119/73  Pulse: 61 63  Resp: 16 16  Temp: (!) 97.4 F (36.3 C) (!) 97.4 F (36.3 C)  SpO2: 93% 93%   Vitals:   01/19/22 2048 01/20/22 0521 01/20/22 0800 01/20/22 0832  BP: 103/72 (!) 112/54 119/73 119/73  Pulse: 62 (!) 53 61 63  Resp: 20 18 16 16   Temp: 98.1 F (36.7 C) (!) 97.5 F (36.4 C) (!) 97.4 F (36.3 C) (!) 97.4 F (36.3 C)  TempSrc: Oral Oral Oral Oral  SpO2: 95% 92% 93% 93%  Weight:      Height:        General: Pt is alert, awake, not in acute distress Cardiovascular: RRR, S1/S2 +, no rubs, no gallops Respiratory: CTA bilaterally, no wheezing, no rhonchi Abdominal: Soft, NT, ND, bowel sounds + Extremities: no edema,  no cyanosis  Discharge Instructions   Allergies as of 01/20/2022       Reactions   Lisinopril Cough        Medication List     TAKE these medications    acetaminophen 325 MG tablet Commonly known as: TYLENOL Take 325 mg by mouth every 6 (six) hours as needed for moderate pain.   albuterol 108 (90 Base) MCG/ACT inhaler Commonly known as: VENTOLIN HFA Inhale 1-2 puffs into the lungs every  6 (six) hours as needed for wheezing or shortness of breath.   ARTIFICIAL SALIVA MT Use as directed 1 spray in the mouth or throat as needed (Dry mouth). MOUTH COAT   buPROPion 150 MG 24 hr tablet Commonly known as: WELLBUTRIN XL Take 150 mg by mouth daily.   carbidopa-levodopa 25-100 MG tablet Commonly known as: SINEMET IR Take 1 tablet by mouth in the morning, at noon, and at bedtime.   COUGHTUSS PO Take 5 mLs by mouth at bedtime.   DIABETIC TUSSIN EX 100 MG/5ML liquid Generic drug: guaiFENesin Take 10 mLs by mouth every 4 (four) hours as needed for cough or to loosen phlegm.   fluticasone 50 MCG/ACT nasal spray Commonly known as: FLONASE Place 1 spray into both nostrils daily as needed for allergies.   furosemide 20 MG tablet Commonly known as: LASIX Take 20 mg by mouth daily.   insulin glargine 100 unit/mL Sopn Commonly known as: LANTUS Inject 16 Units into the skin daily.   ipratropium 0.03 % nasal spray Commonly known as: ATROVENT Place 1 spray into both nostrils at bedtime.   lactulose 10 GM/15ML solution Commonly known as: CHRONULAC Take 15 mLs (10 g total) by mouth 3 (three) times daily as needed for mild constipation.   latanoprost 0.005 % ophthalmic solution Commonly known as: XALATAN Place 1 drop into both eyes at bedtime.   lidocaine 5 % Commonly known as: LIDODERM Place 1 patch onto the skin daily as needed (pain). Remove & Discard patch within 12 hours or as directed by MD   loratadine 10 MG tablet Commonly known as: CLARITIN Take 10 mg by mouth daily as needed for allergies.   meclizine 25 MG tablet Commonly known as: ANTIVERT Take 25 mg by mouth 2 (two) times daily as needed for dizziness.   midodrine 10 MG tablet Commonly known as: PROAMATINE Take 1 tablet (10 mg total) by mouth 3 (three) times daily.   montelukast 10 MG tablet Commonly known as: SINGULAIR Take 10 mg by mouth daily.   multivitamin with minerals Tabs tablet Take 1  tablet by mouth daily.   pantoprazole 40 MG tablet Commonly known as: PROTONIX Take 40 mg by mouth daily with breakfast.   Refresh Tears 0.5 % Soln Generic drug: carboxymethylcellulose Place 1 drop into both eyes 3 (three) times daily as needed (dry eyes).   spironolactone 100 MG tablet Commonly known as: ALDACTONE Take 50 mg by mouth every other day.   tamsulosin 0.4 MG Caps capsule Commonly known as: FLOMAX Take 0.4 mg by mouth 2 (two) times daily.   traZODone 150 MG tablet Commonly known as: DESYREL Take 75 mg by mouth at bedtime as needed for sleep.        Allergies  Allergen Reactions   Lisinopril Cough    You were cared for by a hospitalist during your hospital stay. If you have any questions about your discharge medications or the care you received while you were in the hospital after you are discharged, you  can call the unit and asked to speak with the hospitalist on call if the hospitalist that took care of you is not available. Once you are discharged, your primary care physician will handle any further medical issues. Please note that no refills for any discharge medications will be authorized once you are discharged, as it is imperative that you return to your primary care physician (or establish a relationship with a primary care physician if you do not have one) for your aftercare needs so that they can reassess your need for medications and monitor your lab values.   Procedures/Studies: DG Chest 2 View  Result Date: 01/20/2022 CLINICAL DATA:  [id number] Other cirrhosis of liver [facility]) [id number] Chronic bilateral pleural effusions [id number]. Hematemesis. EXAM: CHEST - 2 VIEW COMPARISON:  01/17/2022 FINDINGS: Moderate left pleural effusion and small right pleural effusion, stable on the left and increased slightly on the right since prior study. Bibasilar opacities could reflect atelectasis or pneumonia, slightly increased since prior study. Heart is normal size. No  acute bony abnormality. IMPRESSION: Stable moderate left pleural effusion. Slight increased small right pleural effusion. Increasing bibasilar atelectasis or infiltrates. Electronically Signed   By: Rolm Baptise M.D.   On: 01/20/2022 09:59   US Abdomen Complete  Result Date: 01/19/2022 CLINICAL DATA:  Cirrhosis EXAM: ABDOMEN ULTRASOUND COMPLETE COMPARISON:  09/01/2021 FINDINGS: Gallbladder: Cholecystectomy. Common bile duct: Diameter: 6 mm Liver: Heterogeneous increased liver echotexture consistent with known cirrhosis. No focal parenchymal liver abnormality. Stable nodularity of the liver capsule. Portal vein is patent on color Doppler imaging with normal direction of blood flow towards the liver. IVC: No abnormality visualized. Pancreas: Visualized portion unremarkable. Spleen: The spleen is enlarged measuring 15.4 cm in anterior-posterior dimension. Right Kidney: Length: 11.2 cm. Echogenicity within normal limits. No mass or hydronephrosis visualized. Left Kidney: Not well visualized. Abdominal aorta: No aneurysm visualized. Other findings: Small amount of loculated ascites within the left upper quadrant. Incidental small bilateral pleural effusions. IMPRESSION: 1. Stable findings of cirrhosis.  No focal liver abnormality. 2. Splenomegaly, compatible with portal venous hypertension. 3. Small amount of loculated ascites left upper quadrant. 4. Incidental small bilateral pleural effusions. Electronically Signed   By: Randa Ngo M.D.   On: 01/19/2022 19:47   ECHOCARDIOGRAM COMPLETE  Result Date: 01/19/2022    ECHOCARDIOGRAM REPORT   Patient Name:   KAYO ZION Date of Exam: 01/19/2022 Medical Rec #:  [id number]      Height:       66.0 in Accession #:    [id number]     Weight:       161.4 lb Date of Birth:  04/29/1950      BSA:          1.826 m Patient Age:    44 years       BP:           109/67 mmHg Patient Gender: M              HR:           65 bpm. Exam Location:  Inpatient Procedure: 2D Echo,  Cardiac Doppler and Color Doppler Indications:    R01.1 Murmur; Z01.818 Encounter for other preprocedural                 examination  History:        Patient has prior history of Echocardiogram examinations, most                 recent 05/01/2020.  COPD, Aortic Valve Disease; Risk                 Factors:Diabetes.  Sonographer:    Roseanna Rainbow RDCS Referring Phys: [place]  1. Left ventricular ejection fraction, by estimation, is 55 to 60%. The left ventricle has normal function. The left ventricle has no regional wall motion abnormalities. Left ventricular diastolic parameters were normal.  2. Right ventricular systolic function is normal. The right ventricular size is mildly enlarged. There is normal pulmonary artery systolic pressure. The estimated right ventricular systolic pressure is 46.6 mmHg.  3. Right atrial size was mildly dilated.  4. The mitral valve is grossly normal. Trivial mitral valve regurgitation. No evidence of mitral stenosis.  5. The aortic valve is tricuspid. Aortic valve regurgitation is mild. Mild aortic valve stenosis. Aortic valve area, by VTI measures 1.72 cm. Aortic valve mean gradient measures 9.0 mmHg. Aortic valve Vmax measures 1.96 m/s.  6. There is moderate dilatation of the ascending aorta, measuring 43 mm.  7. The inferior vena cava is normal in size with greater than 50% respiratory variability, suggesting right atrial pressure of 3 mmHg. Comparison(s): Changes from prior study are noted. Aortic stenosis is now mild. FINDINGS  Left Ventricle: Left ventricular ejection fraction, by estimation, is 55 to 60%. The left ventricle has normal function. The left ventricle has no regional wall motion abnormalities. The left ventricular internal cavity size was normal in size. There is  no left ventricular hypertrophy. Left ventricular diastolic parameters were normal. Right Ventricle: The right ventricular size is mildly enlarged. No increase in right ventricular wall  thickness. Right ventricular systolic function is normal. There is normal pulmonary artery systolic pressure. The tricuspid regurgitant velocity is 2.38  m/s, and with an assumed right atrial pressure of 3 mmHg, the estimated right ventricular systolic pressure is 59.9 mmHg. Left Atrium: Left atrial size was normal in size. Right Atrium: Right atrial size was mildly dilated. Pericardium: There is no evidence of pericardial effusion. Mitral Valve: The mitral valve is grossly normal. Trivial mitral valve regurgitation. No evidence of mitral valve stenosis. Tricuspid Valve: The tricuspid valve is grossly normal. Tricuspid valve regurgitation is mild . No evidence of tricuspid stenosis. Aortic Valve: The aortic valve is tricuspid. Aortic valve regurgitation is mild. Aortic regurgitation PHT measures 228 msec. Mild aortic stenosis is present. Aortic valve mean gradient measures 9.0 mmHg. Aortic valve peak gradient measures 15.4 mmHg. Aortic valve area, by VTI measures 1.72 cm. Pulmonic Valve: The pulmonic valve was grossly normal. Pulmonic valve regurgitation is not visualized. No evidence of pulmonic stenosis. Aorta: The aortic root is normal in size and structure. There is moderate dilatation of the ascending aorta, measuring 43 mm. Venous: The inferior vena cava is normal in size with greater than 50% respiratory variability, suggesting right atrial pressure of 3 mmHg. IAS/Shunts: The atrial septum is grossly normal.  LEFT VENTRICLE PLAX 2D LVIDd:         4.70 cm      Diastology LVIDs:         3.40 cm      LV e' medial:   9.90 cm/s LV PW:         0.90 cm      LV E/e' medial: 8.7 LV IVS:        0.90 cm LVOT diam:     2.30 cm LV SV:         68 LV SV Index:   37 LVOT Area:  4.15 cm  LV Volumes (MOD) LV vol d, MOD A2C: 111.0 ml LV vol d, MOD A4C: 103.0 ml LV vol s, MOD A2C: 44.2 ml LV vol s, MOD A4C: 44.8 ml LV SV MOD A2C:     66.8 ml LV SV MOD A4C:     103.0 ml LV SV MOD BP:      63.8 ml RIGHT VENTRICLE              IVC RV S prime:     10.20 cm/s  IVC diam: 1.90 cm TAPSE (M-mode): 1.0 cm LEFT ATRIUM             Index        RIGHT ATRIUM           Index LA diam:        3.90 cm 2.14 cm/m   RA Area:     28.40 cm LA Vol (A2C):   41.5 ml 22.73 ml/m  RA Volume:   104.00 ml 56.97 ml/m LA Vol (A4C):   61.8 ml 33.85 ml/m LA Biplane Vol: 54.3 ml 29.75 ml/m  AORTIC VALVE AV Area (Vmax):    2.04 cm AV Area (Vmean):   1.94 cm AV Area (VTI):     1.72 cm AV Vmax:           196.40 cm/s AV Vmean:          138.200 cm/s AV VTI:            0.397 m AV Peak Grad:      15.4 mmHg AV Mean Grad:      9.0 mmHg LVOT Vmax:         96.33 cm/s LVOT Vmean:        64.633 cm/s LVOT VTI:          0.165 m LVOT/AV VTI ratio: 0.41 AI PHT:            228 msec  AORTA Ao Root diam: 3.50 cm Ao Asc diam:  4.25 cm MITRAL VALVE               TRICUSPID VALVE MV Area (PHT): 3.72 cm    TR Peak grad:   22.7 mmHg MV Decel Time: 204 msec    TR Vmax:        238.00 cm/s MV E velocity: 86.45 cm/s MV A velocity: 96.65 cm/s  SHUNTS MV E/A ratio:  0.89        Systemic VTI:  0.16 m                            Systemic Diam: 2.30 cm Eleonore Chiquito MD Electronically signed by Eleonore Chiquito MD Signature Date/Time: 01/19/2022/3:09:52 PM    Final    DG Abdomen 1 View  Result Date: 01/17/2022 CLINICAL DATA:  Nasogastric tube placement EXAM: ABDOMEN - 1 VIEW COMPARISON:  None Available. FINDINGS: Nasogastric tube tip overlies the mid body of the stomach. There is moderate gaseous distension of the visualized stomach. Moderate left pleural effusion is present. Small right pleural effusion. IMPRESSION: Nasogastric tube tip within the mid body of the stomach. Electronically Signed   By: Fidela Salisbury M.D.   On: 01/17/2022 20:45   DG Chest Port 1 View  Result Date: 01/17/2022 CLINICAL DATA:  Hematemesis EXAM: PORTABLE CHEST 1 VIEW COMPARISON:  12/08/2021 FINDINGS: Cardiac enlargement. No vascular congestion. Small right and moderate left pleural effusions with basilar  atelectasis or consolidation.  Appearances are similar to prior study. No pneumothorax. IMPRESSION: Chronic bilateral pleural effusions with basilar atelectasis or consolidation, greater on the left. No significant change since prior study. Electronically Signed   By: Lucienne Capers M.D.   On: 01/17/2022 19:27     The results of significant diagnostics from this hospitalization (including imaging, microbiology, ancillary and laboratory) are listed below for reference.     Microbiology: Recent Results (from the past 240 hour(s))  MRSA Next Gen by PCR, Nasal     Status: None   Collection Time: 01/18/22  8:06 PM   Specimen: Nasal Mucosa; Nasal Swab  Result Value Ref Range Status   MRSA by PCR Next Gen NOT DETECTED NOT DETECTED Final    Comment: (NOTE) The GeneXpert MRSA Assay (FDA approved for NASAL specimens only), is one component of a comprehensive MRSA colonization surveillance program. It is not intended to diagnose MRSA infection nor to guide or monitor treatment for MRSA infections. Test performance is not FDA approved in patients less than 38 years old. Performed at [facility], [place] [address], [place], [place] [address]      Labs: BNP (last 3 results) No results for input(s): "BNP" in the last 8760 hours. Basic Metabolic Panel: Recent Labs  Lab 01/17/22 1911 01/18/22 0434 01/19/22 0306  NA 135 137 139  K 3.9 4.4 4.5  CL 103 105 108  CO2 23 24 24   GLUCOSE 254* 132* 113*  BUN 23 26* 31*  CREATININE 1.69* 1.63* 1.48*  CALCIUM 8.7* 8.7* 8.6*  MG  --  1.6* 2.2  PHOS  --  3.3  --    Liver Function Tests: Recent Labs  Lab 01/17/22 1911 01/19/22 0306  AST 62* 37  ALT 45* 48*  ALKPHOS 186* 145*  BILITOT 0.9 0.6  PROT 7.1 6.8  ALBUMIN 2.5* 2.4*   Recent Labs  Lab 01/17/22 1911  LIPASE 32   No results for input(s): "AMMONIA" in the last 168 hours. CBC: Recent Labs  Lab 01/17/22 1911 01/18/22 0132 01/18/22 1115 01/18/22 1151 01/19/22 0306  01/19/22 1623 01/20/22 0827  WBC 3.6*   < > PATIENT IDENTIFICATION ERROR. PLEASE DISREGARD RESULTS. ACCOUNT WILL BE CREDITED. 5.0 3.4* 3.9* 2.2*  NEUTROABS 2.7  --   --   --   --   --   --   HGB 8.6*   < > PATIENT IDENTIFICATION ERROR. PLEASE DISREGARD RESULTS. ACCOUNT WILL BE CREDITED. 9.6* 8.8* 8.7* 8.9*  HCT 26.8*   < > PATIENT IDENTIFICATION ERROR. PLEASE DISREGARD RESULTS. ACCOUNT WILL BE CREDITED. 29.4* 26.7* 27.3* 27.4*  MCV 96.4   < > PATIENT IDENTIFICATION ERROR. PLEASE DISREGARD RESULTS. ACCOUNT WILL BE CREDITED. 91.6 89.6 91.0 91.6  PLT 77*   < > PATIENT IDENTIFICATION ERROR. PLEASE DISREGARD RESULTS. ACCOUNT WILL BE CREDITED. 88* 78* 86* 76*   < > = values in this interval not displayed.   Cardiac Enzymes: No results for input(s): "CKTOTAL", "CKMB", "CKMBINDEX", "TROPONINI" in the last 168 hours. BNP: Invalid input(s): "POCBNP" CBG: Recent Labs  Lab 01/19/22 1148 01/19/22 1600 01/19/22 2053 01/20/22 0759 01/20/22 1118  GLUCAP 240* 124* 137* 133* 222*   D-Dimer No results for input(s): "DDIMER" in the last 72 hours. Hgb A1c Recent Labs    01/18/22 0434  HGBA1C 6.6*   Lipid Profile No results for input(s): "CHOL", "HDL", "LDLCALC", "TRIG", "CHOLHDL", "LDLDIRECT" in the last 72 hours. Thyroid function studies No results for input(s): "TSH", "T4TOTAL", "T3FREE", "THYROIDAB" in the last 72 hours.  Invalid input(s): "FREET3" Anemia work up No results for input(s): "VITAMINB12", "FOLATE", "FERRITIN", "TIBC", "IRON", "RETICCTPCT" in the last 72 hours. Urinalysis    Component Value Date/Time   COLORURINE YELLOW 02/26/2021 1321   APPEARANCEUR CLEAR 02/26/2021 1321   LABSPEC >1.030 (H) 02/26/2021 1321   PHURINE 5.5 02/26/2021 1321   GLUCOSEU NEGATIVE 02/26/2021 1321   HGBUR NEGATIVE 02/26/2021 1321   BILIRUBINUR NEGATIVE 02/26/2021 1321   KETONESUR NEGATIVE 02/26/2021 1321   PROTEINUR NEGATIVE 02/26/2021 1321   NITRITE NEGATIVE 02/26/2021 1321   LEUKOCYTESUR  NEGATIVE 02/26/2021 1321   Sepsis Labs Recent Labs  Lab 01/18/22 1151 01/19/22 0306 01/19/22 1623 01/20/22 0827  WBC 5.0 3.4* 3.9* 2.2*   Microbiology Recent Results (from the past 240 hour(s))  MRSA Next Gen by PCR, Nasal     Status: None   Collection Time: 01/18/22  8:06 PM   Specimen: Nasal Mucosa; Nasal Swab  Result Value Ref Range Status   MRSA by PCR Next Gen NOT DETECTED NOT DETECTED Final    Comment: (NOTE) The GeneXpert MRSA Assay (FDA approved for NASAL specimens only), is one component of a comprehensive MRSA colonization surveillance program. It is not intended to diagnose MRSA infection nor to guide or monitor treatment for MRSA infections. Test performance is not FDA approved in patients less than 2 years old. Performed at [facility], [place] [address], [place], [place] [address]      Time coordinating discharge:  I have spent 35 minutes face to face with the patient and on the ward discussing the patients care, assessment, plan and disposition with other care givers. >50% of the time was devoted counseling the patient about the risks and benefits of treatment/Discharge disposition and coordinating care.   SIGNED:   Damita Lack, MD  [facility] 01/20/2022, 1:06 PM   If 7PM-7AM, please contact night-coverage

## 2022-01-20 NOTE — Progress Notes (Signed)
Physical Therapy Treatment Patient Details Name: GABRIELLE MESTER MRN: [id number] DOB: 1950-10-25 Today's Date: 01/20/2022   History of Present Illness Pt is 71 yo male admitted 01/17/22 with hematemesis, endoscopy with variceal banding x 4 on 01/18/22.  Pt with hx of NASH cirrhosis with esophageal varices with multiple bandings, portal HTN, hepatic hydrothorax, pancytopenia, DM2, HTN, CKD, COPD, GERD, parkinsonism    PT Comments    Continuing work on functional mobility and activity tolerance;  Session focused on progressive amb to work towards pt's goal of going home (and getting to the [employer]  football game on Friday); Walked in the hallway with rollator and min/minguard assist; excellent gait distance; OK for dc home from PT standpoint   Recommendations for follow up therapy are one component of a multi-disciplinary discharge planning process, led by the attending physician.  Recommendations may be updated based on patient status, additional functional criteria and insurance authorization.  Follow Up Recommendations  Home health PT     Assistance Recommended at Discharge Intermittent Supervision/Assistance  Patient can return home with the following A little help with walking and/or transfers;A little help with bathing/dressing/bathroom;Assistance with cooking/housework;Help with stairs or ramp for entrance   Equipment Recommendations  None recommended by PT    Recommendations for Other Services       Precautions / Restrictions Precautions Precautions: Fall     Mobility  Bed Mobility Overal bed mobility: Needs Assistance Bed Mobility: Supine to Sit     Supine to sit: Min assist     General bed mobility comments: increased time; use of rail    Transfers Overall transfer level: Needs assistance Equipment used: Rollator (4 wheels) Transfers: Sit to/from Stand Sit to Stand: Min assist           General transfer comment: Min assist from lower  surfaces; Has a lift chair he uses at home    Ambulation/Gait Ambulation/Gait assistance: Min assist, Min guard Gait Distance (Feet): 300 Feet (with one seated rest break; used rollator) Assistive device: Rollator (4 wheels) Gait Pattern/deviations: Step-through pattern, Decreased stride length, Trunk flexed Gait velocity: decreased     General Gait Details: Mostly minguard, with a few bouts of min assist for rollator management, steadiness, and proximity; noting Rollator too far forward at times   Stairs             Wheelchair Mobility    Modified Rankin (Stroke Patients Only)       Balance     Sitting balance-Leahy Scale: Good       Standing balance-Leahy Scale: Poor                              Cognition Arousal/Alertness: Awake/alert Behavior During Therapy: WFL for tasks assessed/performed Overall Cognitive Status: Impaired/Different from baseline                                 General Comments: Knows that he wants to be out by Friday to go to high school championship football game.        Exercises      General Comments General comments (skin integrity, edema, etc.): Tired at end of walk, and noted decr control of descent to sit to recliner post walk; Family tells PT and SPT he typically uses a lift chair at home      Pertinent Vitals/Pain Pain Assessment Pain Assessment: No/denies  pain    Home Living                          Prior Function            PT Goals (current goals can now be found in the care plan section) Acute Rehab PT Goals Patient Stated Goal: return home by Friday to go to football game PT Goal Formulation: With patient/family Time For Goal Achievement: 02/02/22 Potential to Achieve Goals: Good Progress towards PT goals: Progressing toward goals    Frequency    Min 3X/week      PT Plan Current plan remains appropriate    Co-evaluation              AM-PAC PT "6  Clicks" Mobility   Outcome Measure  Help needed turning from your back to your side while in a flat bed without using bedrails?: A Little Help needed moving from lying on your back to sitting on the side of a flat bed without using bedrails?: A Little Help needed moving to and from a bed to a chair (including a wheelchair)?: A Little Help needed standing up from a chair using your arms (e.g., wheelchair or bedside chair)?: A Little Help needed to walk in hospital room?: A Little Help needed climbing 3-5 steps with a railing? : A Little 6 Click Score: 18    End of Session Equipment Utilized During Treatment: Gait belt Activity Tolerance: Patient tolerated treatment well Patient left: in chair;with call bell/phone within reach;with family/visitor present Nurse Communication: Mobility status PT Visit Diagnosis: Muscle weakness (generalized) (M62.81);Other abnormalities of gait and mobility (R26.89)     Time: 7342-8768 PT Time Calculation (min) (ACUTE ONLY): 33 min  Charges:  $Gait Training: 23-37 mins                     Roney Marion, [place] Office [phone]    Colletta [place] 01/20/2022, 1:23 PM

## 2022-01-27 ENCOUNTER — Ambulatory Visit (INDEPENDENT_AMBULATORY_CARE_PROVIDER_SITE_OTHER): Payer: No Typology Code available for payment source | Admitting: Gastroenterology

## 2022-01-27 ENCOUNTER — Encounter: Payer: Self-pay | Admitting: Gastroenterology

## 2022-01-27 VITALS — BP 130/72 | HR 59 | Ht 66.0 in | Wt 163.4 lb

## 2022-01-27 DIAGNOSIS — J948 Other specified pleural conditions: Secondary | ICD-10-CM | POA: Diagnosis not present

## 2022-01-27 DIAGNOSIS — R188 Other ascites: Secondary | ICD-10-CM | POA: Diagnosis not present

## 2022-01-27 DIAGNOSIS — I8501 Esophageal varices with bleeding: Secondary | ICD-10-CM

## 2022-01-27 DIAGNOSIS — K7460 Unspecified cirrhosis of liver: Secondary | ICD-10-CM | POA: Diagnosis not present

## 2022-01-27 NOTE — Progress Notes (Signed)
HPI :  71 year old male with a history of cirrhosis, here for follow-up visit for cirrhosis and recent GI bleeding.   See prior notes for details of his case. 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  Has been followed by hepatology with [facility] in the past, followed also by multiple subspecialists at the [facility] for other medical problems.    He has had extremely large esophageal varices and GOV2 gastric varices, portal hypertensive gastritis.  Intolerant of beta-blockade due to bradycardia and hypotension, had elected for primary therapy of varices to be done with band ligation.  He has had a complicated course with post banding ulcer bleeding.  He has had more than 40 bands placed over the past 2 years.  We have eventually achieved eradication of his varices to a smaller size, EGD last done in July, no bands were needed to be placed which is the first time he had reached that point. His course has also been complicated by hepatic hydrothorax. He has 1 kidney with some CKD, on low-dose diuretics, intolerant to higher dosing diuretics due to hypotension and recurrent falls in the setting of Parkinson's.  He has been on midodrine now to help with his blood pressure and that has been helping him. He has had evaluation for TIPS in the past by myself, hepatology, and IR, all have thought he been a good candidate for this if effusion is causing him problems because he is limited in his diuretic use.   After our last visit he had wanted to think about a TIPS further.  I had spoken to his hepatologist who agreed this was probably in his best interest.  While he had been feeling okay recently the patient and family have decided to monitor.  Unfortunately the patient was readmitted over a week ago with recurrent variceal bleeding.  He presented with hematemesis.  Our service was consulted on his case, Dr. Arelia Longest did an EGD and placed 4 more bands, there was stigmata for recent  bleeding.  He was treated with octreotide and PPI, managed supportively and discharged on the 25th.  He has been a bit fatigued since he has been in the hospital but he had no further bleeding symptoms.  He states he has been breathing okay, no labored breathing.  After I last saw him in September unfortunately he had recurrent effusion that led to hypoxia and recurrent thoracentesis.  Today he states has been breathing okay.  I discussed again with him and his wife possible TIPS procedure and if he wants to proceed with that at this point time.  He had an echocardiogram while in the hospital which showed stable EF.  He had right upper quadrant ultrasound for [place] screening which looked okay.  His bilirubin remains okay, renal function okay.     Prior workup: EGD 01/30/20 - Esophagogastric landmarks identified. - Large esophageal varices as described. - Erythematous mucosa in the gastric fundus and antrum, suspect portal hypertensive gastritis. Biopsies taken to rule out H pylori. - Suspected small type 2 gastroesophageal varices (GOV2, esophageal varices which extend along the fundus). - Normal duodenal bulb and second portion of the duodenum, very mild superficial erythema Noted.   Colonoscopy 01/30/20 - The perianal and digital rectal examinations were normal. - The terminal ileum appeared normal. - A 5 to 6 mm polyp was found in the transverse colon. The polyp was flat. The polyp was removed with a cold snare. Resection and retrieval were complete. -  Moderately congested mucosa was found in the entire colon, suspected due to portal hypertension. - Internal hemorrhoids were found during retroflexion. - The exam was otherwise without abnormality.   1. Surgical [P], gastric antrum and gastric body - CHRONIC GASTRITIS. - WARTHIN-STARRY IS NEGATIVE FOR HELICOBACTER PYLORI. - NO INTESTINAL METAPLASIA, DYSPLASIA, OR MALIGNANCY. 2. Surgical [P], colon, transverse, polyp - TUBULAR ADENOMA. - NO  HIGH GRADE DYSPLASIA OR MALIGNANCY.     EGD 04/17/20 - Esophagogastric landmarks identified. - Large esophageal varices. Banded x 11. - Type 2 gastroesophageal varices (GOV2, esophageal varices which extend along the fundus). - Erythematous mucosa in the antrum. - Normal duodenal bulb and second portion of the duodenum.   EGD 04/25/20 - Large (> 5 mm) esophageal varices. - Esophageal ulcers with no stigmata of recent bleeding. - Portal hypertensive gastropathy. - A medium amount of food (residue) in the stomach. - Normal examined duodenum. - No specimens collected.   EGD 04/30/20 - No gross lesions in esophagus proximally. - Grade I, grade II and grade III esophageal varices as well as multiple post-banding ulcers with a few ulcers showing active oozing. Near completely eradicated after 6 bands were placed. No active extravasation noted thereafter. - Clotted blood in the entire stomach - suctioned and lavaged with mild clearance and did not see active re-accumulation of bleeding. - Type 2 gastroesophageal varices (GOV2, esophageal varices which extend along the fundus) - not completely visualized as had been at time of first endoscopy but no sing of active bleeding, but recent stigmata or nipple sign could have been missed due to blood in fundus. - Blood in the duodenal bulb and in the second portion of the duodenum - lavaged away.   Echo 05/01/20 - EF 60-65%   EGD 06/10/20 - Esophagogastric landmarks identified. - Large esophageal varices but improved overall in size / appearance compared to initial exam. Banded x 6 in the lower to mid esophagus. - Type 2 gastroesophageal varices (GOV2, esophageal varices which extend along the fundus). - Portal hypertensive gastropathy. - Normal duodenal bulb and second portion of the duodenum.   Patient had a CVA in April, follow up EGD was cancelled   Admitted to [facility] 5/21/08/30/20 for volume overload   EGD 11/10/20 - Esophagogastric  landmarks identified. - Esophageal varices as described above - improvement in size over time but columns still persist, mostly medium in size, one large column. Banded x 6. - Portal hypertensive gastropathy. - Normal duodenal bulb and second portion of the duodenum.     EGD 02/10/21 - Grade II and small (< 5 mm) esophageal varices with no bleeding and no stigmata of recent bleeding. Banded x 3 today with good treatment response. Overall excellent response to EVL with now small varices. - Portal hypertensive gastropathy. No gastric varices seen. - Normal examined duodenum.   RUQ [place] 09/23/20 - IMPRESSION: Cirrhotic liver morphology.  No focal hepatic lesion.     EGD 03/31/21 - retained food in the stomach   EGD 04/21/21 - Dr. Ardis Hughs Three trunks of Grade II varices were found in the distal esophagus with scar evidence of previous banding procedures. Six bands were successfully placed with complete eradication, resulting in deflation of varices. There was no bleeding during the procedure. Mild portal gastropathy changes throughout the stomach. No gastric varices   RUQ 02/27/21: IMPRESSION: 1. Cirrhotic morphology liver.  No sonographic evidence of hepatoma. 2. Common bile duct dilation, likely post cholecystectomy distention.     EGD 10/15/21: Esophagogastric landmarks identified. -  Grade I esophageal varices with flattening - extensive scarring from prior banding, improved. No further banding performed today - Portal hypertensive gastropathy. - Normal duodenal bulb and second portion of the duodenum.     EGD 07/16/21: One column of grade II varices with no bleeding and no stigmata of recent bleeding were found in the distal esophagus,. They were 6 mm in largest diameter. No red wale signs were present. Scarring from prior treatment was visible. The varices appeared smaller than they were at prior exam. Two bands were successfully placed with complete eradication, resulting in  deflation of varices. There was no bleeding during and at the end of the procedure.   The exam of the esophagus was otherwise normal. Moderate portal hypertensive gastropathy was found in the entire examined stomach. No gastric varices. The exam of the stomach was otherwise normal. The duodenal bulb and second portion of the duodenum were normal.       RUQ [place] 09/01/21 -  IMPRESSION: 1. Liver cirrhosis without evidence for solid mass by sonography 2. Right pleural effusion.  Free fluid in the right upper quadrant  EGD 01/18/22: Grade II and large (> 5 mm) esophageal varices. Banded. x 4 some bleeding after banding of smaller varyx with nipple sign - it stopped. - Gastritis. - Portal hypertensive gastropathy. - The examination was otherwise normal. - No specimens collected.    Echocardiogram 01/19/22: EF 55-60%   US abdomen 01/19/22: IMPRESSION: 1. Stable findings of cirrhosis.  No focal liver abnormality. 2. Splenomegaly, compatible with portal venous hypertension. 3. Small amount of loculated ascites left upper quadrant. 4. Incidental small bilateral pleural effusions.       Past Medical History:  Diagnosis Date   Allergy    seasonal allergies   Anxiety    on meds   Aortic valve disorder    Arthritis    generalized (fingers)(shoulders)   Asthma    uses inhaler   Cataract    bilateral -sx    Cirrhosis (HCC)    CKD (chronic kidney disease), stage III (HCC)    only has one kidney   COPD (chronic obstructive pulmonary disease) (HCC)    Depression    on meds   DM (diabetes mellitus) ([place])    on meds   Dyspnea    GERD (gastroesophageal reflux disease)    on meds   Heart murmur    History of colon polyps    History of COVID-19 10/14/2020   History of kidney stones    Hx of acute pancreatitis 10/2019   Hyperlipidemia    on meds   Hypertension    on meds   Hypothyroidism    not on meds at this time   Myelodysplastic syndrome ([place])    Neuromuscular  disorder ([place])    per pt   Obstructive sleep apnea    Oxygen deficiency    Parkinsonism    per pt report   Peripheral edema    bilateral legs   Peripheral positional vertigo    Sleep apnea    No CPAP   Stroke [facility])      Past Surgical History:  Procedure Laterality Date   ANKLE SURGERY     CHOLECYSTECTOMY     ENDARTERECTOMY Left 04/29/2020   Procedure: CAROTID EXPLORATION removal of  central venous catheter;  Surgeon: Rosetta Posner, MD;  Location[facility] OR;  Service: Vascular;  Laterality: Left;   ESOPHAGEAL BANDING N/A 04/17/2020   Procedure: ESOPHAGEAL BANDING;  Surgeon: Yetta Flock, MD;  Location: [facility] ENDOSCOPY;  Service: Gastroenterology;  Laterality: N/A;   ESOPHAGEAL BANDING  04/29/2020   Procedure: ESOPHAGEAL BANDING;  Surgeon: Rush Landmark Telford Nab., MD;  Location: [place];  Service: Gastroenterology;;   ESOPHAGEAL BANDING N/A 06/10/2020   Procedure: ESOPHAGEAL BANDING;  Surgeon: Yetta Flock, MD;  Location: [facility]OS[facility];  Service: Gastroenterology;  Laterality: N/A;   ESOPHAGEAL BANDING N/A 11/10/2020   Procedure: ESOPHAGEAL BANDING;  Surgeon: Yetta Flock, MD;  Location: [facility];  Service: Gastroenterology;  Laterality: N/A;   ESOPHAGEAL BANDING N/A 02/10/2021   Procedure: ESOPHAGEAL BANDING;  Surgeon: Jerene Bears, MD;  Location: [facility]OS[facility];  Service: Gastroenterology;  Laterality: N/A;   ESOPHAGEAL BANDING N/A 04/21/2021   Procedure: ESOPHAGEAL BANDING;  Surgeon: Milus Banister, MD;  Location: [facility];  Service: Endoscopy;  Laterality: N/A;   ESOPHAGEAL BANDING N/A 07/16/2021   Procedure: ESOPHAGEAL BANDING;  Surgeon: Ladene Artist, MD;  Location: [facility];  Service: Gastroenterology;  Laterality: N/A;   ESOPHAGEAL BANDING  01/18/2022   Procedure: ESOPHAGEAL BANDING;  Surgeon: Gatha Mayer, MD;  Location: [facility] ENDOSCOPY;  Service: Gastroenterology;;   ESOPHAGOGASTRODUODENOSCOPY N/A 04/25/2020   Procedure:  ESOPHAGOGASTRODUODENOSCOPY (EGD);  Surgeon: Carol Ada, MD;  Location: [facility];  Service: Endoscopy;  Laterality: N/A;   ESOPHAGOGASTRODUODENOSCOPY (EGD) WITH PROPOFOL N/A 04/17/2020   Procedure: ESOPHAGOGASTRODUODENOSCOPY (EGD) WITH PROPOFOL;  Surgeon: Yetta Flock, MD;  Location: [facility];  Service: Gastroenterology;  Laterality: N/A;   ESOPHAGOGASTRODUODENOSCOPY (EGD) WITH PROPOFOL N/A 04/29/2020   Procedure: ESOPHAGOGASTRODUODENOSCOPY (EGD) WITH PROPOFOL;  Surgeon: Rush Landmark Telford Nab., MD;  Location: [place];  Service: Gastroenterology;  Laterality: N/A;   ESOPHAGOGASTRODUODENOSCOPY (EGD) WITH PROPOFOL N/A 06/10/2020   Procedure: ESOPHAGOGASTRODUODENOSCOPY (EGD) WITH PROPOFOL;  Surgeon: Yetta Flock, MD;  Location: [facility];  Service: Gastroenterology;  Laterality: N/A;   ESOPHAGOGASTRODUODENOSCOPY (EGD) WITH PROPOFOL N/A 11/10/2020   Procedure: ESOPHAGOGASTRODUODENOSCOPY (EGD) WITH PROPOFOL;  Surgeon: Yetta Flock, MD;  Location: [facility];  Service: Gastroenterology;  Laterality: N/A;   ESOPHAGOGASTRODUODENOSCOPY (EGD) WITH PROPOFOL N/A 02/10/2021   Procedure: ESOPHAGOGASTRODUODENOSCOPY (EGD) WITH PROPOFOL;  Surgeon: Jerene Bears, MD;  Location: [facility];  Service: Gastroenterology;  Laterality: N/A;   ESOPHAGOGASTRODUODENOSCOPY (EGD) WITH PROPOFOL N/A 03/31/2021   Procedure: ESOPHAGOGASTRODUODENOSCOPY (EGD) WITH PROPOFOL;  Surgeon: Yetta Flock, MD;  Location: [facility];  Service: Gastroenterology;  Laterality: N/A;   ESOPHAGOGASTRODUODENOSCOPY (EGD) WITH PROPOFOL N/A 04/21/2021   Procedure: ESOPHAGOGASTRODUODENOSCOPY (EGD) WITH PROPOFOL;  Surgeon: Milus Banister, MD;  Location: [facility];  Service: Endoscopy;  Laterality: N/A;   ESOPHAGOGASTRODUODENOSCOPY (EGD) WITH PROPOFOL N/A 07/16/2021   Procedure: ESOPHAGOGASTRODUODENOSCOPY (EGD) WITH PROPOFOL;  Surgeon: Ladene Artist, MD;  Location: [facility];  Service:  Gastroenterology;  Laterality: N/A;   ESOPHAGOGASTRODUODENOSCOPY (EGD) WITH PROPOFOL N/A 10/15/2021   Procedure: ESOPHAGOGASTRODUODENOSCOPY (EGD) WITH PROPOFOL;  Surgeon: Yetta Flock, MD;  Location: [facility];  Service: Gastroenterology;  Laterality: N/A;   ESOPHAGOGASTRODUODENOSCOPY (EGD) WITH PROPOFOL N/A 01/18/2022   Procedure: ESOPHAGOGASTRODUODENOSCOPY (EGD) WITH PROPOFOL;  Surgeon: Gatha Mayer, MD;  Location: [place];  Service: Gastroenterology;  Laterality: N/A;   EYE SURGERY     HERNIA REPAIR     IR PARACENTESIS  09/18/2020   IR RADIOLOGIST EVAL & MGMT  10/22/2021   IR THORACENTESIS ASP PLEURAL SPACE W/IMG GUIDE  10/05/2021   KIDNEY STONE SURGERY     WISDOM TOOTH EXTRACTION     Family History  Problem Relation Age of Onset   Liver cancer Mother    Colon  cancer Neg Hx    Stomach cancer Neg Hx    Colon polyps Neg Hx    Esophageal cancer Neg Hx    Rectal cancer Neg Hx    Social History   Tobacco Use   Smoking status: Never    Passive exposure: Never   Smokeless tobacco: Never  Vaping Use   Vaping Use: Never used  Substance Use Topics   Alcohol use: Not Currently   Drug use: Not Currently   Current Outpatient Medications  Medication Sig Dispense Refill   acetaminophen (TYLENOL) 325 MG tablet Take 325 mg by mouth every 6 (six) hours as needed for moderate pain.     albuterol (VENTOLIN HFA) 108 (90 Base) MCG/ACT inhaler Inhale 1-2 puffs into the lungs every 6 (six) hours as needed for wheezing or shortness of breath.     ARTIFICIAL SALIVA MT Use as directed 1 spray in the mouth or throat as needed (Dry mouth). MOUTH COAT     buPROPion (WELLBUTRIN XL) 150 MG 24 hr tablet Take 150 mg by mouth daily.     carbidopa-levodopa (SINEMET IR) 25-100 MG tablet Take 1 tablet by mouth in the morning, at noon, and at bedtime.     carboxymethylcellulose (REFRESH TEARS) 0.5 % SOLN Place 1 drop into both eyes 3 (three) times daily as needed (dry eyes).     fluticasone  (FLONASE) 50 MCG/ACT nasal spray Place 1 spray into both nostrils daily as needed for allergies.     furosemide (LASIX) 20 MG tablet Take 20 mg by mouth daily.     guaiFENesin (DIABETIC TUSSIN EX) 100 MG/5ML liquid Take 10 mLs by mouth every 4 (four) hours as needed for cough or to loosen phlegm.     insulin glargine (LANTUS) 100 unit/mL SOPN Inject 16 Units into the skin daily.     ipratropium (ATROVENT) 0.03 % nasal spray Place 1 spray into both nostrils at bedtime.     lactulose (CHRONULAC) 10 GM/15ML solution Take 15 mLs (10 g total) by mouth 3 (three) times daily as needed for mild constipation. 1350 mL 2   latanoprost (XALATAN) 0.005 % ophthalmic solution Place 1 drop into both eyes at bedtime.     lidocaine (LIDODERM) 5 % Place 1 patch onto the skin daily as needed (pain). Remove & Discard patch within 12 hours or as directed by MD     loratadine (CLARITIN) 10 MG tablet Take 10 mg by mouth daily as needed for allergies.     meclizine (ANTIVERT) 25 MG tablet Take 25 mg by mouth 2 (two) times daily as needed for dizziness.     midodrine (PROAMATINE) 10 MG tablet Take 1 tablet (10 mg total) by mouth 3 (three) times daily.     montelukast (SINGULAIR) 10 MG tablet Take 10 mg by mouth daily.     Multiple Vitamin (MULTIVITAMIN WITH MINERALS) TABS tablet Take 1 tablet by mouth daily.     pantoprazole (PROTONIX) 40 MG tablet Take 40 mg by mouth daily with breakfast.     Phenyleph-Chlorphen-Hydrocod (COUGHTUSS PO) Take 5 mLs by mouth at bedtime.     spironolactone (ALDACTONE) 100 MG tablet Take 50 mg by mouth every other day.     tamsulosin (FLOMAX) 0.4 MG CAPS capsule Take 0.4 mg by mouth 2 (two) times daily.     traZODone (DESYREL) 150 MG tablet Take 75 mg by mouth at bedtime as needed for sleep.     No current facility-administered medications for this visit.   Allergies  Allergen Reactions   Lisinopril Cough     Review of Systems: All systems reviewed and negative except where noted in  HPI.    DG Chest 2 View  Result Date: 01/20/2022 CLINICAL DATA:  [id number] Other cirrhosis of liver [facility]) [id number] Chronic bilateral pleural effusions [id number]. Hematemesis. EXAM: CHEST - 2 VIEW COMPARISON:  01/17/2022 FINDINGS: Moderate left pleural effusion and small right pleural effusion, stable on the left and increased slightly on the right since prior study. Bibasilar opacities could reflect atelectasis or pneumonia, slightly increased since prior study. Heart is normal size. No acute bony abnormality. IMPRESSION: Stable moderate left pleural effusion. Slight increased small right pleural effusion. Increasing bibasilar atelectasis or infiltrates. Electronically Signed   By: Rolm Baptise M.D.   On: 01/20/2022 09:59   US Abdomen Complete  Result Date: 01/19/2022 CLINICAL DATA:  Cirrhosis EXAM: ABDOMEN ULTRASOUND COMPLETE COMPARISON:  09/01/2021 FINDINGS: Gallbladder: Cholecystectomy. Common bile duct: Diameter: 6 mm Liver: Heterogeneous increased liver echotexture consistent with known cirrhosis. No focal parenchymal liver abnormality. Stable nodularity of the liver capsule. Portal vein is patent on color Doppler imaging with normal direction of blood flow towards the liver. IVC: No abnormality visualized. Pancreas: Visualized portion unremarkable. Spleen: The spleen is enlarged measuring 15.4 cm in anterior-posterior dimension. Right Kidney: Length: 11.2 cm. Echogenicity within normal limits. No mass or hydronephrosis visualized. Left Kidney: Not well visualized. Abdominal aorta: No aneurysm visualized. Other findings: Small amount of loculated ascites within the left upper quadrant. Incidental small bilateral pleural effusions. IMPRESSION: 1. Stable findings of cirrhosis.  No focal liver abnormality. 2. Splenomegaly, compatible with portal venous hypertension. 3. Small amount of loculated ascites left upper quadrant. 4. Incidental small bilateral pleural effusions. Electronically Signed   By:  Randa Ngo M.D.   On: 01/19/2022 19:47   ECHOCARDIOGRAM COMPLETE  Result Date: 01/19/2022    ECHOCARDIOGRAM REPORT   Patient Name:   SAIGE BUSBY Date of Exam: 01/19/2022 Medical Rec #:  [id number]      Height:       66.0 in Accession #:    [id number]     Weight:       161.4 lb Date of Birth:  1950/04/18      BSA:          1.826 m Patient Age:    80 years       BP:           109/67 mmHg Patient Gender: M              HR:           65 bpm. Exam Location:  Inpatient Procedure: 2D Echo, Cardiac Doppler and Color Doppler Indications:    R01.1 Murmur; Z01.818 Encounter for other preprocedural                 examination  History:        Patient has prior history of Echocardiogram examinations, most                 recent 05/01/2020. COPD, Aortic Valve Disease; Risk                 Factors:Diabetes.  Sonographer:    Roseanna Rainbow RDCS Referring Phys: [place]  1. Left ventricular ejection fraction, by estimation, is 55 to 60%. The left ventricle has normal function. The left ventricle has no regional wall motion abnormalities. Left ventricular diastolic parameters were normal.  2. Right ventricular systolic function  is normal. The right ventricular size is mildly enlarged. There is normal pulmonary artery systolic pressure. The estimated right ventricular systolic pressure is 00.9 mmHg.  3. Right atrial size was mildly dilated.  4. The mitral valve is grossly normal. Trivial mitral valve regurgitation. No evidence of mitral stenosis.  5. The aortic valve is tricuspid. Aortic valve regurgitation is mild. Mild aortic valve stenosis. Aortic valve area, by VTI measures 1.72 cm. Aortic valve mean gradient measures 9.0 mmHg. Aortic valve Vmax measures 1.96 m/s.  6. There is moderate dilatation of the ascending aorta, measuring 43 mm.  7. The inferior vena cava is normal in size with greater than 50% respiratory variability, suggesting right atrial pressure of 3 mmHg. Comparison(s): Changes from prior  study are noted. Aortic stenosis is now mild. FINDINGS  Left Ventricle: Left ventricular ejection fraction, by estimation, is 55 to 60%. The left ventricle has normal function. The left ventricle has no regional wall motion abnormalities. The left ventricular internal cavity size was normal in size. There is  no left ventricular hypertrophy. Left ventricular diastolic parameters were normal. Right Ventricle: The right ventricular size is mildly enlarged. No increase in right ventricular wall thickness. Right ventricular systolic function is normal. There is normal pulmonary artery systolic pressure. The tricuspid regurgitant velocity is 2.38  m/s, and with an assumed right atrial pressure of 3 mmHg, the estimated right ventricular systolic pressure is 38.1 mmHg. Left Atrium: Left atrial size was normal in size. Right Atrium: Right atrial size was mildly dilated. Pericardium: There is no evidence of pericardial effusion. Mitral Valve: The mitral valve is grossly normal. Trivial mitral valve regurgitation. No evidence of mitral valve stenosis. Tricuspid Valve: The tricuspid valve is grossly normal. Tricuspid valve regurgitation is mild . No evidence of tricuspid stenosis. Aortic Valve: The aortic valve is tricuspid. Aortic valve regurgitation is mild. Aortic regurgitation PHT measures 228 msec. Mild aortic stenosis is present. Aortic valve mean gradient measures 9.0 mmHg. Aortic valve peak gradient measures 15.4 mmHg. Aortic valve area, by VTI measures 1.72 cm. Pulmonic Valve: The pulmonic valve was grossly normal. Pulmonic valve regurgitation is not visualized. No evidence of pulmonic stenosis. Aorta: The aortic root is normal in size and structure. There is moderate dilatation of the ascending aorta, measuring 43 mm. Venous: The inferior vena cava is normal in size with greater than 50% respiratory variability, suggesting right atrial pressure of 3 mmHg. IAS/Shunts: The atrial septum is grossly normal.  LEFT  VENTRICLE PLAX 2D LVIDd:         4.70 cm      Diastology LVIDs:         3.40 cm      LV e' medial:   9.90 cm/s LV PW:         0.90 cm      LV E/e' medial: 8.7 LV IVS:        0.90 cm LVOT diam:     2.30 cm LV SV:         68 LV SV Index:   37 LVOT Area:     4.15 cm  LV Volumes (MOD) LV vol d, MOD A2C: 111.0 ml LV vol d, MOD A4C: 103.0 ml LV vol s, MOD A2C: 44.2 ml LV vol s, MOD A4C: 44.8 ml LV SV MOD A2C:     66.8 ml LV SV MOD A4C:     103.0 ml LV SV MOD BP:      63.8 ml RIGHT VENTRICLE  IVC RV S prime:     10.20 cm/s  IVC diam: 1.90 cm TAPSE (M-mode): 1.0 cm LEFT ATRIUM             Index        RIGHT ATRIUM           Index LA diam:        3.90 cm 2.14 cm/m   RA Area:     28.40 cm LA Vol (A2C):   41.5 ml 22.73 ml/m  RA Volume:   104.00 ml 56.97 ml/m LA Vol (A4C):   61.8 ml 33.85 ml/m LA Biplane Vol: 54.3 ml 29.75 ml/m  AORTIC VALVE AV Area (Vmax):    2.04 cm AV Area (Vmean):   1.94 cm AV Area (VTI):     1.72 cm AV Vmax:           196.40 cm/s AV Vmean:          138.200 cm/s AV VTI:            0.397 m AV Peak Grad:      15.4 mmHg AV Mean Grad:      9.0 mmHg LVOT Vmax:         96.33 cm/s LVOT Vmean:        64.633 cm/s LVOT VTI:          0.165 m LVOT/AV VTI ratio: 0.41 AI PHT:            228 msec  AORTA Ao Root diam: 3.50 cm Ao Asc diam:  4.25 cm MITRAL VALVE               TRICUSPID VALVE MV Area (PHT): 3.72 cm    TR Peak grad:   22.7 mmHg MV Decel Time: 204 msec    TR Vmax:        238.00 cm/s MV E velocity: 86.45 cm/s MV A velocity: 96.65 cm/s  SHUNTS MV E/A ratio:  0.89        Systemic VTI:  0.16 m                            Systemic Diam: 2.30 cm Eleonore Chiquito MD Electronically signed by Eleonore Chiquito MD Signature Date/Time: 01/19/2022/3:09:52 PM    Final    DG Abdomen 1 View  Result Date: 01/17/2022 CLINICAL DATA:  Nasogastric tube placement EXAM: ABDOMEN - 1 VIEW COMPARISON:  None Available. FINDINGS: Nasogastric tube tip overlies the mid body of the stomach. There is moderate gaseous  distension of the visualized stomach. Moderate left pleural effusion is present. Small right pleural effusion. IMPRESSION: Nasogastric tube tip within the mid body of the stomach. Electronically Signed   By: Fidela Salisbury M.D.   On: 01/17/2022 20:45   DG Chest Port 1 View  Result Date: 01/17/2022 CLINICAL DATA:  Hematemesis EXAM: PORTABLE CHEST 1 VIEW COMPARISON:  12/08/2021 FINDINGS: Cardiac enlargement. No vascular congestion. Small right and moderate left pleural effusions with basilar atelectasis or consolidation. Appearances are similar to prior study. No pneumothorax. IMPRESSION: Chronic bilateral pleural effusions with basilar atelectasis or consolidation, greater on the left. No significant change since prior study. Electronically Signed   By: Lucienne Capers M.D.   On: 01/17/2022 19:27    Lab Results  Component Value Date   WBC 2.2 (L) 01/20/2022   HGB 8.9 (L) 01/20/2022   HCT 27.4 (L) 01/20/2022   MCV 91.6 01/20/2022   PLT 76 (L) 01/20/2022  Lab Results  Component Value Date   INR 1.1 01/20/2022   INR 1.2 01/19/2022   INR 1.4 (H) 01/17/2022    Lab Results  Component Value Date   ALT 48 (H) 01/19/2022   AST 37 01/19/2022   ALKPHOS 145 (H) 01/19/2022   BILITOT 0.6 01/19/2022   Lab Results  Component Value Date   CREATININE 1.48 (H) 01/19/2022   BUN 31 (H) 01/19/2022   NA 139 01/19/2022   K 4.5 01/19/2022   CL 108 01/19/2022   CO2 24 01/19/2022    MELD 3.0: 12 at 01/20/2022  7:53 AM MELD-Na: 11 at 01/20/2022  7:53 AM Calculated from: Serum Creatinine: 1.48 mg/dL at 01/19/2022  3:06 AM Serum Sodium: 139 mmol/L (Using max of 137 mmol/L) at 01/19/2022  3:06 AM Total Bilirubin: 0.6 mg/dL (Using min of 1 mg/dL) at 01/19/2022  3:06 AM Serum Albumin: 2.4 g/dL at 01/19/2022  3:06 AM INR(ratio): 1.1 at 01/20/2022  7:53 AM Age at listing (hypothetical): 24 years Sex: Male at 01/20/2022  7:53 AM     Physical Exam: BP 130/72   Pulse (!) 59   Ht 5' 6"  (1.676 m)    Wt 163 lb 6.4 oz (74.1 kg)   SpO2 93%   BMI 26.37 kg/m  Constitutional: Pleasant,well-developed, male in no acute distress. Neurological: Alert and oriented to person place and time. Psychiatric: Normal mood and affect. Behavior is normal.   ASSESSMENT: 71 y.o. male here for assessment of the following  1. Cirrhosis of liver with ascites, unspecified hepatic cirrhosis type ([facility])   2. Bleeding esophageal varices, unspecified esophageal varices type ([facility])   3. Hydrothorax    Detailed history as outlined above.  Unfortunately despite numerous bandings he had a recurrent variceal bleed in recent weeks.  He is also had hepatic hydrothorax which is led to periodic need for thoracentesis and transient hypoxia.  I have discussed role of TIPS with them in the past and do think he is a good candidate for this.  He has had extremely large varices that have been very difficult to manage with band ligation alone, between this issue, his recurrent hepatic hydrothorax and limited use of diuretics due to his 1 kidney and hypotension, I think that TIPS is in his best interest.  We discussed risks for this, include worsening hepatic encephalopathy.  His encephalopathy has been mild and well controlled on lactulose.  I am going to have him evaluated by IR again to discuss TIPS.  I have discussed his case with hepatology who agrees that TIPS is next step at this point.  I feel benefits of TIPS outweigh risk at this point time and could reduce his risk for recurrent hospitalization.  His echocardiogram is up-to-date, labs stable.  Following full discussion with patient and family they are agreeable to TIPS.  PLAN: - refer to IR for reassessment for TIPS, will speak with Dr. Annamaria Boots - continue PPI, lasix / aldactone, midocrine, cannot be on beta blocker as above - f/u post TIPS or as needed in the interim  Jolly Mango, MD [facility]

## 2022-01-27 NOTE — Patient Instructions (Addendum)
  If you are age 73 or older, your body mass index should be between 23-30. Your Body mass index is 26.37 kg/m. If this is out of the aforementioned range listed, please consider follow up with your Primary Care Provider.  If you are age 20 or younger, your body mass index should be between 19-25. Your Body mass index is 26.37 kg/m. If this is out of the aformentioned range listed, please consider follow up with your Primary Care Provider.   _______________________________________________________   Dennis Bast have been scheduled for a follow up appointment with Dr. Havery Moros on Thursday, Jan 4th at 9:20am. Please arrive 10 minutes early for registration.   You have been referred to IR for TIPS evaluation.  Thank you for entrusting me with your care and for choosing [facility], Dr. Concord Cellar

## 2022-01-28 ENCOUNTER — Other Ambulatory Visit (HOSPITAL_COMMUNITY): Payer: Self-pay | Admitting: Interventional Radiology

## 2022-01-28 ENCOUNTER — Other Ambulatory Visit: Payer: Self-pay | Admitting: Interventional Radiology

## 2022-01-28 DIAGNOSIS — K7581 Nonalcoholic steatohepatitis (NASH): Secondary | ICD-10-CM

## 2022-02-16 ENCOUNTER — Telehealth: Payer: No Typology Code available for payment source

## 2022-02-16 ENCOUNTER — Ambulatory Visit
Admission: RE | Admit: 2022-02-16 | Discharge: 2022-02-16 | Disposition: A | Discharge status: Home / Self Care | Payer: No Typology Code available for payment source | Source: Ambulatory Visit | Attending: Interventional Radiology | Admitting: Interventional Radiology

## 2022-02-16 ENCOUNTER — Other Ambulatory Visit (HOSPITAL_COMMUNITY): Payer: Self-pay | Admitting: Interventional Radiology

## 2022-02-16 DIAGNOSIS — J948 Other specified pleural conditions: Secondary | ICD-10-CM | POA: Diagnosis not present

## 2022-02-16 DIAGNOSIS — I8501 Esophageal varices with bleeding: Secondary | ICD-10-CM | POA: Diagnosis not present

## 2022-02-16 DIAGNOSIS — K7460 Unspecified cirrhosis of liver: Secondary | ICD-10-CM

## 2022-02-16 DIAGNOSIS — K7469 Other cirrhosis of liver: Secondary | ICD-10-CM | POA: Diagnosis not present

## 2022-02-16 NOTE — Progress Notes (Signed)
Patient ID: Corey Wood, male   DOB: 23-Jan-1951, 71 y.o.   MRN: [id number]       Chief Complaint:  Cryptogenic cirrhosis, variceal bleeding  Referring Physician(s): Armbruster  History of Present Illness: Corey Wood is a 71 y.o. male with known cryptogenic cirrhosis thought to be secondary to NASH.  He is followed closely by [facility], [facility]  He has a long history of a prior large esophageal varices, gastric varices, and portal gastropathy.  He has had multiple endoscopies with laparoscopic variceal banding.  He just recently was admitted in late October for another acute episode of hematemesis and GI bleeding from the esophageal varices.  He had additional banding at that time as well as transfusion for acute blood loss anemia.  No further episodes since late October.  He has been back at home recovering for the entire month of November.  He also has pleural effusions, larger on the right presumed to be hepatic hydrothorax.  These have required thoracentesis before.  He does not have significant ascites.  He has been intolerant to diuretic therapy with orthostatic hypotension, near syncope, and recent falls.  Unfortunately he also has mild Parkinson's disease which by report from his wife has progressed slowly in the last 3 months.  His mild hepatic encephalopathy is controlled by daily lactulose.  Since our last visit, he has had a CT with contrast, reduced dose because of his renal function.  Liver venous anatomy appears amenable to TIPS creation.  Portal vein is patent.  No venoocclusive finding or thrombosis.  Cirrhosis noted as well as splenomegaly.  Past Medical History:  Diagnosis Date   Allergy    seasonal allergies   Anxiety    on meds   Aortic valve disorder    Arthritis    generalized (fingers)(shoulders)   Asthma    uses inhaler   Cataract    bilateral -sx    Cirrhosis (HCC)    CKD (chronic kidney disease), stage III (HCC)    only  has one kidney   COPD (chronic obstructive pulmonary disease) (HCC)    Depression    on meds   DM (diabetes mellitus) ([place])    on meds   Dyspnea    GERD (gastroesophageal reflux disease)    on meds   Heart murmur    History of colon polyps    History of COVID-19 10/14/2020   History of kidney stones    Hx of acute pancreatitis 10/2019   Hyperlipidemia    on meds   Hypertension    on meds   Hypothyroidism    not on meds at this time   Myelodysplastic syndrome ([place])    Neuromuscular disorder ([place])    per pt   Obstructive sleep apnea    Oxygen deficiency    Parkinsonism    per pt report   Peripheral edema    bilateral legs   Peripheral positional vertigo    Sleep apnea    No CPAP   Stroke [facility])     Past Surgical History:  Procedure Laterality Date   ANKLE SURGERY     CHOLECYSTECTOMY     ENDARTERECTOMY Left 04/29/2020   Procedure: CAROTID EXPLORATION removal of  central venous catheter;  Surgeon: Rosetta Posner, MD;  Location: [facility] OR;  Service: Vascular;  Laterality: Left;   ESOPHAGEAL BANDING N/A 04/17/2020   Procedure: ESOPHAGEAL BANDING;  Surgeon: Yetta Flock, MD;  Location: [facility];  Service: Gastroenterology;  Laterality: N/A;   ESOPHAGEAL BANDING  04/29/2020   Procedure: ESOPHAGEAL BANDING;  Surgeon: Rush Landmark Telford Nab., MD;  Location: [place];  Service: Gastroenterology;;   ESOPHAGEAL BANDING N/A 06/10/2020   Procedure: ESOPHAGEAL BANDING;  Surgeon: Yetta Flock, MD;  Location: [facility];  Service: Gastroenterology;  Laterality: N/A;   ESOPHAGEAL BANDING N/A 11/10/2020   Procedure: ESOPHAGEAL BANDING;  Surgeon: Yetta Flock, MD;  Location: [facility];  Service: Gastroenterology;  Laterality: N/A;   ESOPHAGEAL BANDING N/A 02/10/2021   Procedure: ESOPHAGEAL BANDING;  Surgeon: Jerene Bears, MD;  Location: [facility];  Service: Gastroenterology;  Laterality: N/A;   ESOPHAGEAL BANDING N/A 04/21/2021   Procedure: ESOPHAGEAL  BANDING;  Surgeon: Milus Banister, MD;  [facility]: [facility];  Service: Endoscopy;  Laterality: N/A;   ESOPHAGEAL BANDING N/A 07/16/2021   Procedure: ESOPHAGEAL BANDING;  Surgeon: Ladene Artist, MD;  [facility];  Service: Gastroenterology;  Laterality: N/A;   ESOPHAGEAL BANDING  01/18/2022   Procedure: ESOPHAGEAL BANDING;  Surgeon: Gatha Mayer, MD;  Location[facility] ENDOSCOPY;  Service: Gastroenterology;;   ESOPHAGOGASTRODUODENOSCOPY N/A 04/25/2020   Procedure: ESOPHAGOGASTRODUODENOSCOPY (EGD);  Surgeon: Carol Ada, MD;  Location: [facility];  Service: Endoscopy;  Laterality: N/A;   ESOPHAGOGASTRODUODENOSCOPY (EGD) WITH PROPOFOL N/A 04/17/2020   Procedure: ESOPHAGOGASTRODUODENOSCOPY (EGD) WITH PROPOFOL;  Surgeon: Yetta Flock, MD;  [facility];  Service: Gastroenterology;  Laterality: N/A;   ESOPHAGOGASTRODUODENOSCOPY (EGD) WITH PROPOFOL N/A 04/29/2020   Procedure: ESOPHAGOGASTRODUODENOSCOPY (EGD) WITH PROPOFOL;  Surgeon: Rush Landmark Telford Nab., MD;  Location: [place];  Service: Gastroenterology;  Laterality: N/A;   ESOPHAGOGASTRODUODENOSCOPY (EGD) WITH PROPOFOL N/A 06/10/2020   Procedure: ESOPHAGOGASTRODUODENOSCOPY (EGD) WITH PROPOFOL;  Surgeon: Yetta Flock, MD;  [facility];  Service: Gastroenterology;  Laterality: N/A;   ESOPHAGOGASTRODUODENOSCOPY (EGD) WITH PROPOFOL N/A 11/10/2020   Procedure: ESOPHAGOGASTRODUODENOSCOPY (EGD) WITH PROPOFOL;  Surgeon: Yetta Flock, MD;  [facility];  Service: Gastroenterology;  Laterality: N/A;   ESOPHAGOGASTRODUODENOSCOPY (EGD) WITH PROPOFOL N/A 02/10/2021   Procedure: ESOPHAGOGASTRODUODENOSCOPY (EGD) WITH PROPOFOL;  Surgeon: Jerene Bears, MD;  [facility];  Service: Gastroenterology;  Laterality: N/A;   ESOPHAGOGASTRODUODENOSCOPY (EGD) WITH PROPOFOL N/A 03/31/2021   Procedure: ESOPHAGOGASTRODUODENOSCOPY (EGD) WITH PROPOFOL;  Surgeon: Yetta Flock, MD;  Location:  [facility];  Service: Gastroenterology;  Laterality: N/A;   ESOPHAGOGASTRODUODENOSCOPY (EGD) WITH PROPOFOL N/A 04/21/2021   Procedure: ESOPHAGOGASTRODUODENOSCOPY (EGD) WITH PROPOFOL;  Surgeon: Milus Banister, MD;  [facility];  Service: Endoscopy;  Laterality: N/A;   ESOPHAGOGASTRODUODENOSCOPY (EGD) WITH PROPOFOL N/A 07/16/2021   Procedure: ESOPHAGOGASTRODUODENOSCOPY (EGD) WITH PROPOFOL;  Surgeon: Ladene Artist, MD;  [facility];  Service: Gastroenterology;  Laterality: N/A;   ESOPHAGOGASTRODUODENOSCOPY (EGD) WITH PROPOFOL N/A 10/15/2021   Procedure: ESOPHAGOGASTRODUODENOSCOPY (EGD) WITH PROPOFOL;  Surgeon: Yetta Flock, MD;  [facility];  Service: Gastroenterology;  Laterality: N/A;   ESOPHAGOGASTRODUODENOSCOPY (EGD) WITH PROPOFOL N/A 01/18/2022   Procedure: ESOPHAGOGASTRODUODENOSCOPY (EGD) WITH PROPOFOL;  Surgeon: Gatha Mayer, MD;  Location: [place];  Service: Gastroenterology;  Laterality: N/A;   EYE SURGERY     HERNIA REPAIR     IR PARACENTESIS  09/18/2020   IR RADIOLOGIST EVAL & MGMT  10/22/2021   IR THORACENTESIS ASP PLEURAL SPACE W/IMG GUIDE  10/05/2021   KIDNEY STONE SURGERY     WISDOM TOOTH EXTRACTION      Allergies: Lisinopril  Medications: Prior to Admission medications   Medication Sig Start Date End Date Taking? Authorizing Provider  acetaminophen (TYLENOL) 325  MG tablet Take 325 mg by mouth every 6 (six) hours as needed for moderate pain.    [provider]  albuterol (VENTOLIN HFA) 108 (90 Base) MCG/ACT inhaler Inhale 1-2 puffs into the lungs every 6 (six) hours as needed for wheezing or shortness of breath.    [provider]  ARTIFICIAL SALIVA MT Use as directed 1 spray in the mouth or throat as needed (Dry mouth). MOUTH COAT 10/26/20   [provider]  buPROPion (WELLBUTRIN XL) 150 MG 24 hr tablet Take 150 mg by mouth daily.    [provider]  carbidopa-levodopa (SINEMET IR) 25-100 MG  tablet Take 1 tablet by mouth in the morning, at noon, and at bedtime. 06/30/21   [provider]  carboxymethylcellulose (REFRESH TEARS) 0.5 % SOLN Place 1 drop into both eyes 3 (three) times daily as needed (dry eyes).    [provider]  fluticasone (FLONASE) 50 MCG/ACT nasal spray Place 1 spray into both nostrils daily as needed for allergies.    [provider]  furosemide (LASIX) 20 MG tablet Take 20 mg by mouth daily.    [provider]  guaiFENesin (DIABETIC TUSSIN EX) 100 MG/5ML liquid Take 10 mLs by mouth every 4 (four) hours as needed for cough or to loosen phlegm.    [provider]  insulin glargine (LANTUS) 100 unit/mL SOPN Inject 16 Units into the skin daily.    [provider]  ipratropium (ATROVENT) 0.03 % nasal spray Place 1 spray into both nostrils at bedtime.    [provider]  lactulose (CHRONULAC) 10 GM/15ML solution Take 15 mLs (10 g total) by mouth 3 (three) times daily as needed for mild constipation. 10/21/21   Armbruster, Carlota Raspberry, MD  latanoprost (XALATAN) 0.005 % ophthalmic solution Place 1 drop into both eyes at bedtime. 08/20/20   [provider]  lidocaine (LIDODERM) 5 % Place 1 patch onto the skin daily as needed (pain). Remove & Discard patch within 12 hours or as directed by MD    [provider]  loratadine (CLARITIN) 10 MG tablet Take 10 mg by mouth daily as needed for allergies. 09/04/20   [provider]  meclizine (ANTIVERT) 25 MG tablet Take 25 mg by mouth 2 (two) times daily as needed for dizziness.    [provider]  midodrine (PROAMATINE) 10 MG tablet Take 1 tablet (10 mg total) by mouth 3 (three) times daily. 10/21/21   Armbruster, Carlota Raspberry, MD  montelukast (SINGULAIR) 10 MG tablet Take 10 mg by mouth daily. 05/22/20   [provider]  Multiple Vitamin (MULTIVITAMIN WITH MINERALS) TABS tablet Take 1 tablet by mouth daily.    [provider]   pantoprazole (PROTONIX) 40 MG tablet Take 40 mg by mouth daily with breakfast.    [provider]  Phenyleph-Chlorphen-Hydrocod (COUGHTUSS PO) Take 5 mLs by mouth at bedtime.    [provider]  spironolactone (ALDACTONE) 100 MG tablet Take 50 mg by mouth every other day.    [provider]  tamsulosin (FLOMAX) 0.4 MG CAPS capsule Take 0.4 mg by mouth 2 (two) times daily.    [provider]  traZODone (DESYREL) 150 MG tablet Take 75 mg by mouth at bedtime as needed for sleep. 09/28/19   [provider]     Family History  Problem Relation Age of Onset   Liver cancer Mother    Colon cancer Neg Hx    Stomach cancer Neg Hx  Colon polyps Neg Hx    Esophageal cancer Neg Hx    Rectal cancer Neg Hx     Social History   Socioeconomic History   Marital status: Married    Spouse name: Not on file   Number of children: Not on file   Years of education: Not on file   Highest education level: Not on file  Occupational History   Occupation: retired  Tobacco Use   Smoking status: Never    Passive exposure: Never   Smokeless tobacco: Never  Vaping Use   Vaping Use: Never used  Substance and Sexual Activity   Alcohol use: Not Currently   Drug use: Not Currently   Sexual activity: Not Currently  Other Topics Concern   Not on file  Social History Narrative   Not on file   Social Determinants of Health   Financial Resource Strain: Not on file  Food Insecurity: No Food Insecurity (01/18/2022)   Hunger Vital Sign    Worried About Running Out of Food in the Last Year: Never true    Ran Out of Food in the Last Year: Never true  Transportation Needs: No Transportation Needs (01/18/2022)   PRAPARE - Hydrologist (Medical): No    Lack of Transportation (Non-Medical): No  Physical Activity: Not on file  Stress: Not on file  Social Connections: Not on file     Review of Systems  Review of Systems: A 12 point  ROS discussed and pertinent positives are indicated in the HPI above.  All other systems are negative.    Physical Exam No direct physical exam was performed, telephone health visit only today to review interval planning CT.  There were no vitals taken for this visit.  Imaging: DG Chest 2 View  Result Date: 01/20/2022 CLINICAL DATA:  [id number] Other cirrhosis of liver [facility]) [id number] Chronic bilateral pleural effusions [id number]. Hematemesis. EXAM: CHEST - 2 VIEW COMPARISON:  01/17/2022 FINDINGS: Moderate left pleural effusion and small right pleural effusion, stable on the left and increased slightly on the right since prior study. Bibasilar opacities could reflect atelectasis or pneumonia, slightly increased since prior study. Heart is normal size. No acute bony abnormality. IMPRESSION: Stable moderate left pleural effusion. Slight increased small right pleural effusion. Increasing bibasilar atelectasis or infiltrates. Electronically Signed   By: Rolm Baptise M.D.   On: 01/20/2022 09:59   US Abdomen Complete  Result Date: 01/19/2022 CLINICAL DATA:  Cirrhosis EXAM: ABDOMEN ULTRASOUND COMPLETE COMPARISON:  09/01/2021 FINDINGS: Gallbladder: Cholecystectomy. Common bile duct: Diameter: 6 mm Liver: Heterogeneous increased liver echotexture consistent with known cirrhosis. No focal parenchymal liver abnormality. Stable nodularity of the liver capsule. Portal vein is patent on color Doppler imaging with normal direction of blood flow towards the liver. IVC: No abnormality visualized. Pancreas: Visualized portion unremarkable. Spleen: The spleen is enlarged measuring 15.4 cm in anterior-posterior dimension. Right Kidney: Length: 11.2 cm. Echogenicity within normal limits. No mass or hydronephrosis visualized. Left Kidney: Not well visualized. Abdominal aorta: No aneurysm visualized. Other findings: Small amount of loculated ascites within the left upper quadrant. Incidental small bilateral pleural  effusions. IMPRESSION: 1. Stable findings of cirrhosis.  No focal liver abnormality. 2. Splenomegaly, compatible with portal venous hypertension. 3. Small amount of loculated ascites left upper quadrant. 4. Incidental small bilateral pleural effusions. Electronically Signed   By: Randa Ngo M.D.   On: 01/19/2022 19:47   ECHOCARDIOGRAM COMPLETE  Result Date: 01/19/2022    ECHOCARDIOGRAM REPORT  Patient Name:   Corey Wood Date of Exam: 01/19/2022 Medical Rec #:  [id number]      Height:       66.0 in Accession #:    [id number]     Weight:       161.4 lb Date of Birth:  12/07/1950      BSA:          1.826 m Patient Age:    54 years       BP:           109/67 mmHg Patient Gender: M              HR:           65 bpm. Exam Location:  Inpatient Procedure: 2D Echo, Cardiac Doppler and Color Doppler Indications:    R01.1 Murmur; Z01.818 Encounter for other preprocedural                 examination  History:        Patient has prior history of Echocardiogram examinations, most                 recent 05/01/2020. COPD, Aortic Valve Disease; Risk                 Factors:Diabetes.  Sonographer:    Roseanna Rainbow RDCS Referring Phys: [place]  1. Left ventricular ejection fraction, by estimation, is 55 to 60%. The left ventricle has normal function. The left ventricle has no regional wall motion abnormalities. Left ventricular diastolic parameters were normal.  2. Right ventricular systolic function is normal. The right ventricular size is mildly enlarged. There is normal pulmonary artery systolic pressure. The estimated right ventricular systolic pressure is 85.8 mmHg.  3. Right atrial size was mildly dilated.  4. The mitral valve is grossly normal. Trivial mitral valve regurgitation. No evidence of mitral stenosis.  5. The aortic valve is tricuspid. Aortic valve regurgitation is mild. Mild aortic valve stenosis. Aortic valve area, by VTI measures 1.72 cm. Aortic valve mean gradient measures 9.0 mmHg.  Aortic valve Vmax measures 1.96 m/s.  6. There is moderate dilatation of the ascending aorta, measuring 43 mm.  7. The inferior vena cava is normal in size with greater than 50% respiratory variability, suggesting right atrial pressure of 3 mmHg. Comparison(s): Changes from prior study are noted. Aortic stenosis is now mild. FINDINGS  Left Ventricle: Left ventricular ejection fraction, by estimation, is 55 to 60%. The left ventricle has normal function. The left ventricle has no regional wall motion abnormalities. The left ventricular internal cavity size was normal in size. There is  no left ventricular hypertrophy. Left ventricular diastolic parameters were normal. Right Ventricle: The right ventricular size is mildly enlarged. No increase in right ventricular wall thickness. Right ventricular systolic function is normal. There is normal pulmonary artery systolic pressure. The tricuspid regurgitant velocity is 2.38  m/s, and with an assumed right atrial pressure of 3 mmHg, the estimated right ventricular systolic pressure is 85.0 mmHg. Left Atrium: Left atrial size was normal in size. Right Atrium: Right atrial size was mildly dilated. Pericardium: There is no evidence of pericardial effusion. Mitral Valve: The mitral valve is grossly normal. Trivial mitral valve regurgitation. No evidence of mitral valve stenosis. Tricuspid Valve: The tricuspid valve is grossly normal. Tricuspid valve regurgitation is mild . No evidence of tricuspid stenosis. Aortic Valve: The aortic valve is tricuspid. Aortic valve regurgitation is mild. Aortic regurgitation PHT measures 228  msec. Mild aortic stenosis is present. Aortic valve mean gradient measures 9.0 mmHg. Aortic valve peak gradient measures 15.4 mmHg. Aortic valve area, by VTI measures 1.72 cm. Pulmonic Valve: The pulmonic valve was grossly normal. Pulmonic valve regurgitation is not visualized. No evidence of pulmonic stenosis. Aorta: The aortic root is normal in size and  structure. There is moderate dilatation of the ascending aorta, measuring 43 mm. Venous: The inferior vena cava is normal in size with greater than 50% respiratory variability, suggesting right atrial pressure of 3 mmHg. IAS/Shunts: The atrial septum is grossly normal.  LEFT VENTRICLE PLAX 2D LVIDd:         4.70 cm      Diastology LVIDs:         3.40 cm      LV e' medial:   9.90 cm/s LV PW:         0.90 cm      LV E/e' medial: 8.7 LV IVS:        0.90 cm LVOT diam:     2.30 cm LV SV:         68 LV SV Index:   37 LVOT Area:     4.15 cm  LV Volumes (MOD) LV vol d, MOD A2C: 111.0 ml LV vol d, MOD A4C: 103.0 ml LV vol s, MOD A2C: 44.2 ml LV vol s, MOD A4C: 44.8 ml LV SV MOD A2C:     66.8 ml LV SV MOD A4C:     103.0 ml LV SV MOD BP:      63.8 ml RIGHT VENTRICLE             IVC RV S prime:     10.20 cm/s  IVC diam: 1.90 cm TAPSE (M-mode): 1.0 cm LEFT ATRIUM             Index        RIGHT ATRIUM           Index LA diam:        3.90 cm 2.14 cm/m   RA Area:     28.40 cm LA Vol (A2C):   41.5 ml 22.73 ml/m  RA Volume:   104.00 ml 56.97 ml/m LA Vol (A4C):   61.8 ml 33.85 ml/m LA Biplane Vol: 54.3 ml 29.75 ml/m  AORTIC VALVE AV Area (Vmax):    2.04 cm AV Area (Vmean):   1.94 cm AV Area (VTI):     1.72 cm AV Vmax:           196.40 cm/s AV Vmean:          138.200 cm/s AV VTI:            0.397 m AV Peak Grad:      15.4 mmHg AV Mean Grad:      9.0 mmHg LVOT Vmax:         96.33 cm/s LVOT Vmean:        64.633 cm/s LVOT VTI:          0.165 m LVOT/AV VTI ratio: 0.41 AI PHT:            228 msec  AORTA Ao Root diam: 3.50 cm Ao Asc diam:  4.25 cm MITRAL VALVE               TRICUSPID VALVE MV Area (PHT): 3.72 cm    TR Peak grad:   22.7 mmHg MV Decel Time: 204 msec    TR Vmax:  238.00 cm/s MV E velocity: 86.45 cm/s MV A velocity: 96.65 cm/s  SHUNTS MV E/A ratio:  0.89        Systemic VTI:  0.16 m                            Systemic Diam: 2.30 cm Eleonore Chiquito MD Electronically signed by Eleonore Chiquito MD Signature Date/Time:  01/19/2022/3:09:52 PM    Final    DG Abdomen 1 View  Result Date: 01/17/2022 CLINICAL DATA:  Nasogastric tube placement EXAM: ABDOMEN - 1 VIEW COMPARISON:  None Available. FINDINGS: Nasogastric tube tip overlies the mid body of the stomach. There is moderate gaseous distension of the visualized stomach. Moderate left pleural effusion is present. Small right pleural effusion. IMPRESSION: Nasogastric tube tip within the mid body of the stomach. Electronically Signed   By: Fidela Salisbury M.D.   On: 01/17/2022 20:45   DG Chest Port 1 View  Result Date: 01/17/2022 CLINICAL DATA:  Hematemesis EXAM: PORTABLE CHEST 1 VIEW COMPARISON:  12/08/2021 FINDINGS: Cardiac enlargement. No vascular congestion. Small right and moderate left pleural effusions with basilar atelectasis or consolidation. Appearances are similar to prior study. No pneumothorax. IMPRESSION: Chronic bilateral pleural effusions with basilar atelectasis or consolidation, greater on the left. No significant change since prior study. Electronically Signed   By: Lucienne Capers M.D.   On: 01/17/2022 19:27    Labs:  CBC: Recent Labs    01/18/22 1151 01/19/22 0306 01/19/22 1623 01/20/22 0827  WBC 5.0 3.4* 3.9* 2.2*  HGB 9.6* 8.8* 8.7* 8.9*  HCT 29.4* 26.7* 27.3* 27.4*  PLT 88* 78* 86* 76*    COAGS: Recent Labs    02/17/21 1542 01/17/22 1911 01/19/22 1623 01/20/22 0753  INR 1.4* 1.4* 1.2 1.1  APTT  --  33  --   --     BMP: Recent Labs    02/26/21 1345 01/17/22 1911 01/18/22 0434 01/19/22 0306  NA 136 135 137 139  K 4.0 3.9 4.4 4.5  CL 102 103 105 108  CO2 27 23 24 24   GLUCOSE 226* 254* 132* 113*  BUN 22 23 26* 31*  CALCIUM 9.5 8.7* 8.7* 8.6*  CREATININE 1.89* 1.69* 1.63* 1.48*  GFRNONAA 37* 42* 44* 50*    LIVER FUNCTION TESTS: Recent Labs    02/17/21 1542 02/26/21 1345 01/17/22 1911 01/19/22 0306  BILITOT 0.7 0.9 0.9 0.6  AST 24 29 62* 37  ALT 18 23 45* 48*  ALKPHOS 120* 105 186* 145*  PROT 8.1 8.1  7.1 6.8  ALBUMIN 3.4* 3.1* 2.5* 2.4*     Assessment and Plan:  71 year old debilitated male with Parkinson's disease as well as cryptogenic cirrhosis/NASH.  Known portal hypertension with esophageal and gastric varices, status post multiple endoscopic banding procedures, most recently in late October less than a month ago.  Chronic orthostatic hypotension related to ongoing diuretic therapy which he has been intolerant to.  By telephone today the TIPS procedure was reviewed again with the patient in detail including the procedure, risk, benefits and alternatives.  They understand this requires general anesthesia and an overnight recovery.  All questions addressed.  They have a clear understanding of the procedure and would like to go ahead and reschedule as soon as possible at [facility].  Expected goals, outcomes and recovery reviewed.  Successful TIPS procedure would hopefully improve his hepatic hydrothorax, decompress his varices, decrease the risk of recurrent variceal bleeding, and hopefully help  with his intolerance to diuretic therapy and associated orthostatic hypotension.  Plan: Scheduled for elective TIPS with anesthesia at [facility].     Electronically Signed: Greggory Keen 02/16/2022, 11:30 AM   I spent a total of    40 Minutes in remote  clinical consultation, greater than 50% of which was counseling/coordinating care for this patient with cirrhosis, esophageal varices, and recurrent bleeding as well as hepatic hydrothorax..    Visit type: Audio only (telephone). Audio (no video) only due to patient's lack of internet/smartphone capability. Alternative for in-person consultation at [facility], [place] [address], [place], [place]. This visit type was conducted due to national recommendations for restrictions regarding the COVID-19 Pandemic (e.g. social distancing).  This format is felt to be most appropriate for this patient at this time.  All issues noted  in this document were discussed and addressed.

## 2022-03-04 ENCOUNTER — Ambulatory Visit (HOSPITAL_COMMUNITY)
Admission: RE | Admit: 2022-03-04 | Discharge: 2022-03-04 | Disposition: A | Discharge status: Home / Self Care | Payer: No Typology Code available for payment source | Source: Ambulatory Visit | Attending: Gastroenterology | Admitting: Gastroenterology

## 2022-03-04 DIAGNOSIS — K7460 Unspecified cirrhosis of liver: Secondary | ICD-10-CM | POA: Insufficient documentation

## 2022-03-10 ENCOUNTER — Telehealth: Payer: Self-pay

## 2022-03-10 NOTE — Telephone Encounter (Signed)
Rec'd note from [facility] inquiring when patient is scheduled for TIPS as patient's wife thought she saw March 11th but she is concerned that he would wait that long.  Called [facility] at [phone].  They are going to look into the status of scheduling him and will update the patient and his wife.

## 2022-03-11 ENCOUNTER — Encounter (HOSPITAL_COMMUNITY): Payer: Self-pay

## 2022-03-24 ENCOUNTER — Other Ambulatory Visit: Payer: Self-pay

## 2022-03-24 ENCOUNTER — Encounter (HOSPITAL_COMMUNITY): Payer: Self-pay | Admitting: Interventional Radiology

## 2022-03-24 NOTE — Progress Notes (Signed)
Interview done with the wife Ivin Booty.  PCP - Dr. Armbruster/Dr. Mina Marble- [facility] [place]  Cardiologist - Can not recall the name at this time  EP- Denies  Endocrine- Denies  Pulm- Denies  Chest x-ray - 01/20/22 (E)  EKG - 01/19/22 (E)  Stress Test - Denies  ECHO - 01/19/22 (E)  Cardiac Cath - Denies  AICD-na PM-na LOOP-na  Nerve Stimulator- Denies  Dialysis- Denies  Sleep Study - Yes- Positive  CPAP - Denies  LABS- 03/26/22: CBC, BMP, T/S  ASA- Denies  ERAS- No  HA1C- 01/18/22(E): 6.6 Fasting Blood Sugar - 101-151 Checks Blood Sugar ___1__ times a day  Anesthesia- Yes- medical history  Ivin Booty denies the pt having chest pain, sob, or fever during the pre-op phone call. All instructions explained to [facility], with a verbal understanding of the material. Ivin Booty also instructed from them to wear a mask and social distance if they go out. The opportunity to ask questions was provided.

## 2022-03-24 NOTE — Progress Notes (Signed)
S.D.W- Instructions   Your procedure is scheduled on Fri., Dec. 29, 2023 from 11:45AM-2:45PM.  Report to [facility] Main Entrance "A" at 9:15 A.M., then check in with the Admitting office.  Call this number if you have problems the morning of surgery:  [phone]             If you experience any cold or flu symptoms such as cough, fever, chills, shortness of breath, etc. between now and your scheduled surgery, please notify us at the above         number.  Masks are now required throughout our facilities due to the increasing cases of Covid, Flu, and RSV infections.   Remember:  Do not eat after midnight on Dec. 28th    Take these medicines the morning of surgery with A SIP OF WATER:  BuPROPion (WELLBUTRIN XL)  Carbidopa-levodopa (SINEMET IR)  Rifaximin (XIFAXAN)  Tamsulosin (FLOMAX)   If Needed: Acetaminophen (TYLENOL)  Albuterol (VENTOLIN HFA)  Fluticasone (FLONASE)  Meclizine (ANTIVERT)   As of today, STOP taking any Aspirin (unless otherwise instructed by your surgeon) Aleve, Naproxen, Ibuprofen, Motrin, Advil, Goody's, BC's, all herbal medications, fish oil, and all vitamins.   How to Manage Your Diabetes Before and After Surgery  How do I manage my blood sugar before surgery? Check your blood sugar the morning of your surgery when you wake up and every 2 hours until you get to the Short Stay unit. If your blood sugar is less than 70 mg/dL, you will need to treat for low blood sugar: Do not take insulin. Treat a low blood sugar (less than 70 mg/dL) with  cup of clear juice (cranberry or apple), 4 glucose tablets, OR glucose gel. Recheck blood sugar in 15 minutes after treatment (to make sure it is greater than 70 mg/dL). If your blood sugar is not greater than 70 mg/dL on recheck, call [phone]  for further instructions. Report your blood sugar to the short stay nurse when you get to Short Stay.  WHAT DO I DO ABOUT MY DIABETES MEDICATION?.   THE MORNING OF  SURGERY, take _______8______ units of _____Lantus_____insulin.  Reviewed and Endorsed by [facility] Patient Education Committee, August 2015          Do not wear jewelry or makeup. Do not wear lotions, powders, cologne or deodorant. Do not shave 48 hours prior to surgery.  Men may shave face and neck. Do not bring valuables to the hospital.  [facility] is not responsible for any belongings or valuables.    Do NOT Smoke (Tobacco/Vaping)  24 hours prior to your procedure  If you use a CPAP at night, you may bring your mask for your overnight stay.   Contacts, glasses, hearing aids, dentures or partials may not be worn into surgery, please bring cases for these belongings   For patients admitted to the hospital, discharge time will be determined by your treatment team.   Patients discharged the day of surgery will not be allowed to drive home, and someone needs to stay with them for 24 hours.  Special instructions:    Oral Hygiene is also important to reduce your risk of infection.  Remember - BRUSH YOUR TEETH THE MORNING OF SURGERY WITH YOUR REGULAR TOOTHPASTE  [facility]- Preparing For Surgery  Before surgery, you can play an important role. Because skin is not sterile, your skin needs to be as free of germs as possible. You can reduce the number of germs on your skin  by washing with Antibacterial Soap before surgery.     Please follow these instructions carefully.     Shower the NIGHT BEFORE SURGERY and the MORNING OF SURGERY with Antibacterial Soap.   Pat yourself dry with a CLEAN TOWEL.  Wear CLEAN PAJAMAS to bed the night before surgery  Place CLEAN SHEETS on your bed the night before your surgery  DO NOT SLEEP WITH PETS.  Day of Surgery:  Take a shower with Antibacterial soap. Wear Clean/Comfortable clothing the morning of surgery Do not apply any deodorants/lotions.   Remember to brush your teeth WITH YOUR REGULAR TOOTHPASTE.   If you test positive for Covid,  or been in contact with anyone that has tested positive in the last 10 days, please notify your surgeon.  SURGICAL WAITING ROOM VISITATION Patients having surgery or a procedure may have no more than 2 support people in the waiting area - these visitors may rotate.   Children under the age of 58 must have an adult with them who is not the patient. If the patient needs to stay at the hospital during part of their recovery, the visitor guidelines for inpatient rooms apply. Pre-op nurse will coordinate an appropriate time for 1 support person to accompany patient in pre-op.  This support person may not rotate.   Please refer to the [employer] [facility] [employer] [facility] website for the visitor guidelines for Inpatients (after your surgery is over and you are in a regular room).

## 2022-03-25 ENCOUNTER — Other Ambulatory Visit: Payer: Self-pay | Admitting: Radiology

## 2022-03-25 DIAGNOSIS — K7469 Other cirrhosis of liver: Secondary | ICD-10-CM

## 2022-03-25 NOTE — Anesthesia Preprocedure Evaluation (Addendum)
Anesthesia Evaluation  Patient identified by MRN, date of birth, ID band Patient awake    Reviewed: Allergy & Precautions, NPO status , Patient's Chart, lab work & pertinent test results  History of Anesthesia Complications Negative for: history of anesthetic complications  Airway Mallampati: II  TM Distance: >3 FB Neck ROM: Full    Dental  (+) Dental Advisory Given, Teeth Intact   Pulmonary asthma , sleep apnea , COPD   Pulmonary exam normal        Cardiovascular hypertension, Pt. on medications Normal cardiovascular exam   '23 TTE - EF 55 to 60%. Right atrial size was mildly dilated. Trivial mitral valve regurgitation. Aortic valve regurgitation is mild. Mild aortic valve stenosis. There is moderate dilatation of the ascending aorta, measuring 43 mm.      Neuro/Psych  PSYCHIATRIC DISORDERS Anxiety Depression    CVA, No Residual Symptoms    GI/Hepatic Neg liver ROS,GERD  Medicated,,  Endo/Other  diabetes, Type 2, Insulin DependentHypothyroidism    Renal/GU CRFRenal disease     Musculoskeletal  (+) Arthritis ,    Abdominal   Peds  Hematology  (+) Blood dyscrasia, anemia  MDS Plt 81k Pancytopenia    Anesthesia Other Findings   Reproductive/Obstetrics                             Anesthesia Physical Anesthesia Plan  ASA: 4  Anesthesia Plan: General   Post-op Pain Management: Minimal or no pain anticipated   Induction: Intravenous  PONV Risk Score and Plan: 2 and Treatment may vary due to age or medical condition, Ondansetron and Dexamethasone  Airway Management Planned: Oral ETT  Additional Equipment: Arterial line  Intra-op Plan:   Post-operative Plan: Extubation in OR  Informed Consent: I have reviewed the patients History and Physical, chart, labs and discussed the procedure including the risks, benefits and alternatives for the proposed anesthesia with the patient or  authorized representative who has indicated his/her understanding and acceptance.     Dental advisory given  Plan Discussed with: CRNA and Anesthesiologist  Anesthesia Plan Comments: (See PAT note )        Anesthesia Quick Evaluation

## 2022-03-25 NOTE — Progress Notes (Addendum)
Anesthesia Chart Review: Same day workup  History of COPD.  He has recently been evaluated by pulmonologist Dr. 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  Last seen 03/18/2022.  Per note, "Corey Wood is a 71 y.o. 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 He underwent US thoracentesis on 12/08/21. Unfortunately this procedure did not result in any symptom improvement, if anything he has had increased cough following the procedure. Currently exercise tolerance is limited more by weakness and concern for falls than dyspnea. Dyspnea did improve significantly on 2lpm oxygen. Recommendations: - tunneled pleural catheter NOT recommended at this time. - PRN therapeutic thoracentesis with extreme caution given deterioration in symptoms after last procedure and no improvement in dyspnea - Continue oxygen 2 lpm 24-hrs - pt can return to clinic PRN if clinical condition changes, routine follow up not required."  History of OSA, not on CPAP.  IDDM 2, A1c 6.6 on 01/18/2022.  History of CKD 3.  History of parkinson's disease, maintained on carbidopa-levidopa.  Chronic anemia and thrombocytopenia secondary to NASH cirrhosis.  Baseline hemoglobin ~9 and baseline platelets ~80-90k  He has a history of prior large esophageal varices, gastric varices, and portal gastropathy.  He has had multiple endoscopies with laparoscopic variceal banding.  He was recently mated in October 2023 for another acute episode of hematemesis and GI bleeding from esophageal varices and had additional banding at that time as well as transfusion for acute blood loss anemia.  MELD 3.0: 12 at 01/20/2022  7:53 AM MELD-Na: 11 at 01/20/2022  7:53 AM Calculated from: Serum Creatinine: 1.48 mg/dL at 01/19/2022  3:06 AM Serum Sodium: 139 mmol/L (Using max of 137 mmol/L) at 01/19/2022  3:06 AM Total Bilirubin: 0.6 mg/dL (Using min of 1 mg/dL) at 01/19/2022  3:06 AM Serum Albumin: 2.4  g/dL at 01/19/2022  3:06 AM INR(ratio): 1.1 at 01/20/2022  7:53 AM Age at listing (hypothetical): 15 years Sex: Male at 01/20/2022  7:53 AM  Pt will need DOS labs and eval.  EKG 01/19/2022: Sinus rhythm with PACs.  Low-voltage QRS.  Rate 67.  Possible anterior infarct, age undetermined.  No significant change/tracing.  CHEST - 2 VIEW 03/18/2022 (Care Everywhere): COMPARISON:  Chest 01/20/2022   FINDINGS:  Moderate left pleural effusion unchanged. Left lower lobe  atelectasis or infiltrate unchanged.   Small right pleural effusion unchanged.   Negative for heart failure or edema.   IMPRESSION:  1. Moderate left pleural effusion and left lower lobe atelectasis or  infiltrate unchanged.  2. Small right pleural effusion unchanged.   TTE 01/19/2022:  1. Left ventricular ejection fraction, by estimation, is 55 to 60%. The  left ventricle has normal function. The left ventricle has no regional  wall motion abnormalities. Left ventricular diastolic parameters were  normal.   2. Right ventricular systolic function is normal. The right ventricular  size is mildly enlarged. There is normal pulmonary artery systolic  pressure. The estimated right ventricular systolic pressure is 26.7 mmHg.   3. Right atrial size was mildly dilated.   4. The mitral valve is grossly normal. Trivial mitral valve  regurgitation. No evidence of mitral stenosis.   5. The aortic valve is tricuspid. Aortic valve regurgitation is mild.  Mild aortic valve stenosis. Aortic valve area, by VTI measures 1.72 cm.  Aortic valve mean gradient measures 9.0 mmHg. Aortic valve Vmax measures  1.96 m/s.   6. There is moderate dilatation of the ascending aorta, measuring 43 mm.   7.  The inferior vena cava is normal in size with greater than 50%  respiratory variability, suggesting right atrial pressure of 3 mmHg.   Comparison(s): Changes from prior study are noted. Aortic stenosis is now  mild.     Corey Wood [facility] Short Stay Center/Anesthesiology Phone [phone] 03/25/2022 4:12 PM

## 2022-03-26 ENCOUNTER — Inpatient Hospital Stay (HOSPITAL_COMMUNITY)
Admission: RE | Admit: 2022-03-26 | Discharge: 2022-03-26 | Disposition: A | Discharge status: Home / Self Care | Payer: No Typology Code available for payment source | Source: Ambulatory Visit | Attending: Interventional Radiology | Admitting: Interventional Radiology

## 2022-03-26 ENCOUNTER — Inpatient Hospital Stay (HOSPITAL_COMMUNITY): Payer: No Typology Code available for payment source | Admitting: Certified Registered"

## 2022-03-26 ENCOUNTER — Encounter (HOSPITAL_COMMUNITY): Payer: Self-pay

## 2022-03-26 ENCOUNTER — Other Ambulatory Visit: Payer: Self-pay | Admitting: Radiology

## 2022-03-26 ENCOUNTER — Encounter (HOSPITAL_COMMUNITY)
Admission: RE | Disposition: A | Discharge status: Home / Self Care | Payer: Self-pay | Source: Home / Self Care | Attending: Interventional Radiology

## 2022-03-26 ENCOUNTER — Inpatient Hospital Stay (HOSPITAL_COMMUNITY)
Admission: RE | Admit: 2022-03-26 | Discharge: 2022-03-27 | DRG: 406 | Disposition: A | Discharge status: Home / Self Care | Payer: No Typology Code available for payment source | Attending: Interventional Radiology | Admitting: Interventional Radiology

## 2022-03-26 DIAGNOSIS — Z8673 Personal history of transient ischemic attack (TIA), and cerebral infarction without residual deficits: Secondary | ICD-10-CM

## 2022-03-26 DIAGNOSIS — E1122 Type 2 diabetes mellitus with diabetic chronic kidney disease: Secondary | ICD-10-CM | POA: Diagnosis present

## 2022-03-26 DIAGNOSIS — E785 Hyperlipidemia, unspecified: Secondary | ICD-10-CM | POA: Diagnosis present

## 2022-03-26 DIAGNOSIS — N189 Chronic kidney disease, unspecified: Secondary | ICD-10-CM | POA: Diagnosis not present

## 2022-03-26 DIAGNOSIS — K219 Gastro-esophageal reflux disease without esophagitis: Secondary | ICD-10-CM | POA: Diagnosis present

## 2022-03-26 DIAGNOSIS — K7581 Nonalcoholic steatohepatitis (NASH): Secondary | ICD-10-CM

## 2022-03-26 DIAGNOSIS — J45909 Unspecified asthma, uncomplicated: Secondary | ICD-10-CM | POA: Diagnosis present

## 2022-03-26 DIAGNOSIS — Z794 Long term (current) use of insulin: Secondary | ICD-10-CM | POA: Diagnosis not present

## 2022-03-26 DIAGNOSIS — Z8616 Personal history of COVID-19: Secondary | ICD-10-CM

## 2022-03-26 DIAGNOSIS — J918 Pleural effusion in other conditions classified elsewhere: Secondary | ICD-10-CM | POA: Diagnosis present

## 2022-03-26 DIAGNOSIS — J948 Other specified pleural conditions: Secondary | ICD-10-CM | POA: Diagnosis present

## 2022-03-26 DIAGNOSIS — Z800 Family history of malignant neoplasm of digestive organs: Secondary | ICD-10-CM | POA: Diagnosis not present

## 2022-03-26 DIAGNOSIS — K7460 Unspecified cirrhosis of liver: Secondary | ICD-10-CM

## 2022-03-26 DIAGNOSIS — N183 Chronic kidney disease, stage 3 unspecified: Secondary | ICD-10-CM | POA: Diagnosis present

## 2022-03-26 DIAGNOSIS — K7290 Hepatic failure, unspecified without coma: Secondary | ICD-10-CM | POA: Diagnosis present

## 2022-03-26 DIAGNOSIS — R188 Other ascites: Secondary | ICD-10-CM | POA: Diagnosis present

## 2022-03-26 DIAGNOSIS — I8510 Secondary esophageal varices without bleeding: Secondary | ICD-10-CM | POA: Diagnosis present

## 2022-03-26 DIAGNOSIS — J449 Chronic obstructive pulmonary disease, unspecified: Secondary | ICD-10-CM | POA: Diagnosis present

## 2022-03-26 DIAGNOSIS — G20A1 Parkinson's disease without dyskinesia, without mention of fluctuations: Secondary | ICD-10-CM | POA: Diagnosis present

## 2022-03-26 DIAGNOSIS — G4733 Obstructive sleep apnea (adult) (pediatric): Secondary | ICD-10-CM | POA: Diagnosis present

## 2022-03-26 DIAGNOSIS — E039 Hypothyroidism, unspecified: Secondary | ICD-10-CM | POA: Diagnosis present

## 2022-03-26 DIAGNOSIS — K766 Portal hypertension: Secondary | ICD-10-CM | POA: Diagnosis present

## 2022-03-26 DIAGNOSIS — D6959 Other secondary thrombocytopenia: Secondary | ICD-10-CM | POA: Diagnosis present

## 2022-03-26 DIAGNOSIS — I129 Hypertensive chronic kidney disease with stage 1 through stage 4 chronic kidney disease, or unspecified chronic kidney disease: Secondary | ICD-10-CM | POA: Diagnosis not present

## 2022-03-26 DIAGNOSIS — K7469 Other cirrhosis of liver: Secondary | ICD-10-CM | POA: Diagnosis present

## 2022-03-26 DIAGNOSIS — Z79899 Other long term (current) drug therapy: Secondary | ICD-10-CM

## 2022-03-26 HISTORY — PX: IR TIPS: IMG2295

## 2022-03-26 HISTORY — PX: RADIOLOGY WITH ANESTHESIA: SHX6223

## 2022-03-26 HISTORY — PX: IR INTRAVASCULAR ULTRASOUND NON CORONARY: IMG6085

## 2022-03-26 HISTORY — PX: IR US GUIDE VASC ACCESS RIGHT: IMG2390

## 2022-03-26 LAB — GLUCOSE, CAPILLARY
Glucose-Capillary: 168 mg/dL — ABNORMAL HIGH (ref 70–99)
Glucose-Capillary: 149 mg/dL — ABNORMAL HIGH (ref 70–99)
Glucose-Capillary: 116 mg/dL — ABNORMAL HIGH (ref 70–99)

## 2022-03-26 LAB — COMPREHENSIVE METABOLIC PANEL
ALT: 7 U/L (ref 0–44)
AST: 31 U/L (ref 15–41)
Albumin: 2.8 g/dL — ABNORMAL LOW (ref 3.5–5.0)
Alkaline Phosphatase: 114 U/L (ref 38–126)
Anion gap: 7 (ref 5–15)
BUN: 12 mg/dL (ref 8–23)
CO2: 24 mmol/L (ref 22–32)
Calcium: 8.8 mg/dL — ABNORMAL LOW (ref 8.9–10.3)
Chloride: 103 mmol/L (ref 98–111)
Creatinine, Ser: 1.41 mg/dL — ABNORMAL HIGH (ref 0.61–1.24)
GFR, Estimated: 53 mL/min — ABNORMAL LOW (ref >60)
Glucose, Bld: 153 mg/dL — ABNORMAL HIGH (ref 70–99)
Potassium: 3.5 mmol/L (ref 3.5–5.1)
Sodium: 134 mmol/L — ABNORMAL LOW (ref 135–145)
Total Bilirubin: 0.7 mg/dL (ref 0.3–1.2)
Total Protein: 7.9 g/dL (ref 6.5–8.1)

## 2022-03-26 LAB — PROTIME-INR
INR: 1.2 (ref 0.8–1.2)
Prothrombin Time: 14.6 seconds (ref 11.4–15.2)

## 2022-03-26 LAB — CBC
HCT: 29.8 % — ABNORMAL LOW (ref 39.0–52.0)
Hemoglobin: 9.6 g/dL — ABNORMAL LOW (ref 13.0–17.0)
MCH: 31 pg (ref 26.0–34.0)
MCHC: 32.2 g/dL (ref 30.0–36.0)
MCV: 96.1 fL (ref 80.0–100.0)
Platelets: 81 10*3/uL — ABNORMAL LOW (ref 150–400)
RBC: 3.1 MIL/uL — ABNORMAL LOW (ref 4.22–5.81)
RDW: 15.7 % — ABNORMAL HIGH (ref 11.5–15.5)
WBC: 2.5 10*3/uL — ABNORMAL LOW (ref 4.0–10.5)
nRBC: 0 % (ref 0.0–0.2)

## 2022-03-26 LAB — TYPE AND SCREEN
ABO/RH(D): O POS
Antibody Screen: NEGATIVE

## 2022-03-26 SURGERY — IR WITH ANESTHESIA
Anesthesia: General

## 2022-03-26 MED ORDER — PHENYLEPHRINE HCL-NACL 20-0.9 MG/250ML-% IV SOLN
INTRAVENOUS | Status: DC | PRN
  Administered 2022-03-26: 40 ug/min via INTRAVENOUS

## 2022-03-26 MED ORDER — LATANOPROST 0.005 % OP SOLN
1.0000 [drp] | Freq: Every day | OPHTHALMIC | Status: DC
  Filled 2022-03-26: qty 2.5

## 2022-03-26 MED ORDER — LIDOCAINE 2% (20 MG/ML) 5 ML SYRINGE
INTRAMUSCULAR | Status: DC | PRN
  Administered 2022-03-26: 40 mg via INTRAVENOUS

## 2022-03-26 MED ORDER — ONDANSETRON HCL 4 MG/2ML IJ SOLN: 4.0000 mg | Freq: Once | INTRAMUSCULAR | Status: DC | PRN

## 2022-03-26 MED ORDER — INSULIN GLARGINE-YFGN 100 UNIT/ML ~~LOC~~ SOLN
16.0000 [IU] | Freq: Every day | SUBCUTANEOUS | Status: DC
  Administered 2022-03-26 – 2022-03-27 (×2): 16 [IU] via SUBCUTANEOUS
  Filled 2022-03-26 (×2): qty 0.16

## 2022-03-26 MED ORDER — FENTANYL CITRATE (PF) 100 MCG/2ML IJ SOLN: 25.0000 ug | INTRAMUSCULAR | Status: DC | PRN

## 2022-03-26 MED ORDER — PROPOFOL 10 MG/ML IV BOLUS
INTRAVENOUS | Status: DC | PRN
  Administered 2022-03-26: 80 mg via INTRAVENOUS

## 2022-03-26 MED ORDER — IPRATROPIUM BROMIDE 0.06 % NA SOLN
1.0000 | Freq: Every day | NASAL | Status: DC
  Filled 2022-03-26: qty 15

## 2022-03-26 MED ORDER — POLYVINYL ALCOHOL 1.4 % OP SOLN: 1.0000 [drp] | Freq: Three times a day (TID) | OPHTHALMIC | Status: DC | PRN

## 2022-03-26 MED ORDER — PANTOPRAZOLE SODIUM 40 MG PO TBEC
40.0000 mg | DELAYED_RELEASE_TABLET | Freq: Every day | ORAL | Status: DC
  Administered 2022-03-27: 40 mg via ORAL
  Filled 2022-03-26: qty 1

## 2022-03-26 MED ORDER — IOHEXOL 300 MG/ML  SOLN
150.0000 mL | Freq: Once | INTRAMUSCULAR | Status: AC | PRN
  Administered 2022-03-26: 75 mL via INTRAVENOUS

## 2022-03-26 MED ORDER — SODIUM CHLORIDE 0.9 % IV SOLN: INTRAVENOUS | Status: DC

## 2022-03-26 MED ORDER — CHLORHEXIDINE GLUCONATE 0.12 % MT SOLN: 15.0000 mL | Freq: Once | OROMUCOSAL | Status: AC

## 2022-03-26 MED ORDER — MIDODRINE HCL 5 MG PO TABS
10.0000 mg | ORAL_TABLET | Freq: Three times a day (TID) | ORAL | Status: DC
  Administered 2022-03-26 – 2022-03-27 (×3): 10 mg via ORAL
  Filled 2022-03-26 (×3): qty 2

## 2022-03-26 MED ORDER — LORATADINE 10 MG PO TABS: 10.0000 mg | ORAL_TABLET | Freq: Every day | ORAL | Status: DC | PRN

## 2022-03-26 MED ORDER — FENTANYL CITRATE (PF) 250 MCG/5ML IJ SOLN
INTRAMUSCULAR | Status: DC | PRN
  Administered 2022-03-26: 100 ug via INTRAVENOUS

## 2022-03-26 MED ORDER — OXYCODONE HCL 5 MG/5ML PO SOLN: 5.0000 mg | Freq: Once | ORAL | Status: DC | PRN

## 2022-03-26 MED ORDER — LACTATED RINGERS IV SOLN: INTRAVENOUS | Status: DC

## 2022-03-26 MED ORDER — RIFAXIMIN 550 MG PO TABS
550.0000 mg | ORAL_TABLET | Freq: Two times a day (BID) | ORAL | Status: DC
  Administered 2022-03-26 – 2022-03-27 (×2): 550 mg via ORAL
  Filled 2022-03-26 (×2): qty 1

## 2022-03-26 MED ORDER — SODIUM CHLORIDE 0.9 % IV SOLN: INTRAVENOUS | Status: DC | PRN

## 2022-03-26 MED ORDER — ALBUMIN HUMAN 5 % IV SOLN: INTRAVENOUS | Status: DC | PRN

## 2022-03-26 MED ORDER — ORAL CARE MOUTH RINSE: 15.0000 mL | Freq: Once | OROMUCOSAL | Status: AC

## 2022-03-26 MED ORDER — PHENYLEPHRINE 80 MCG/ML (10ML) SYRINGE FOR IV PUSH (FOR BLOOD PRESSURE SUPPORT)
PREFILLED_SYRINGE | INTRAVENOUS | Status: DC | PRN
  Administered 2022-03-26 (×4): 80 ug via INTRAVENOUS

## 2022-03-26 MED ORDER — INSULIN ASPART 100 UNIT/ML IJ SOLN: 0.0000 [IU] | INTRAMUSCULAR | Status: DC | PRN

## 2022-03-26 MED ORDER — TAMSULOSIN HCL 0.4 MG PO CAPS
0.4000 mg | ORAL_CAPSULE | Freq: Two times a day (BID) | ORAL | Status: DC
  Administered 2022-03-26 – 2022-03-27 (×2): 0.4 mg via ORAL
  Filled 2022-03-26 (×2): qty 1

## 2022-03-26 MED ORDER — FENTANYL CITRATE (PF) 100 MCG/2ML IJ SOLN
INTRAMUSCULAR | Status: AC
  Filled 2022-03-26: qty 2

## 2022-03-26 MED ORDER — ONDANSETRON HCL 4 MG/2ML IJ SOLN
INTRAMUSCULAR | Status: DC | PRN
  Administered 2022-03-26: 4 mg via INTRAVENOUS

## 2022-03-26 MED ORDER — LACTULOSE 10 GM/15ML PO SOLN: 10.0000 g | Freq: Three times a day (TID) | ORAL | Status: DC | PRN

## 2022-03-26 MED ORDER — MECLIZINE HCL 25 MG PO TABS: 25.0000 mg | ORAL_TABLET | Freq: Two times a day (BID) | ORAL | Status: DC | PRN

## 2022-03-26 MED ORDER — GUAIFENESIN 100 MG/5ML PO LIQD: 10.0000 mL | ORAL | Status: DC | PRN

## 2022-03-26 MED ORDER — CHLORHEXIDINE GLUCONATE 0.12 % MT SOLN
OROMUCOSAL | Status: AC
  Administered 2022-03-26: 15 mL via OROMUCOSAL
  Filled 2022-03-26: qty 15

## 2022-03-26 MED ORDER — OXYCODONE HCL 5 MG PO TABS: 5.0000 mg | ORAL_TABLET | Freq: Once | ORAL | Status: DC | PRN

## 2022-03-26 MED ORDER — SPIRONOLACTONE 25 MG PO TABS
50.0000 mg | ORAL_TABLET | ORAL | Status: DC
  Administered 2022-03-27: 50 mg via ORAL
  Filled 2022-03-26: qty 2

## 2022-03-26 MED ORDER — SUGAMMADEX SODIUM 200 MG/2ML IV SOLN
INTRAVENOUS | Status: DC | PRN
  Administered 2022-03-26: 200 mg via INTRAVENOUS

## 2022-03-26 MED ORDER — CARBIDOPA-LEVODOPA 25-100 MG PO TABS
1.0000 | ORAL_TABLET | ORAL | Status: DC
  Administered 2022-03-26 – 2022-03-27 (×2): 1 via ORAL
  Filled 2022-03-26 (×2): qty 1

## 2022-03-26 MED ORDER — ROCURONIUM BROMIDE 10 MG/ML (PF) SYRINGE
PREFILLED_SYRINGE | INTRAVENOUS | Status: DC | PRN
  Administered 2022-03-26: 50 mg via INTRAVENOUS
  Administered 2022-03-26: 10 mg via INTRAVENOUS

## 2022-03-26 MED ORDER — SODIUM CHLORIDE 0.9 % IV SOLN
2.0000 g | INTRAVENOUS | Status: AC
  Administered 2022-03-26: 2 g via INTRAVENOUS
  Filled 2022-03-26: qty 20

## 2022-03-26 MED ORDER — ALBUTEROL SULFATE (2.5 MG/3ML) 0.083% IN NEBU: 3.0000 mL | INHALATION_SOLUTION | Freq: Four times a day (QID) | RESPIRATORY_TRACT | Status: DC | PRN

## 2022-03-26 MED ORDER — MONTELUKAST SODIUM 10 MG PO TABS
10.0000 mg | ORAL_TABLET | Freq: Every day | ORAL | Status: DC
  Administered 2022-03-26 – 2022-03-27 (×2): 10 mg via ORAL
  Filled 2022-03-26 (×2): qty 1

## 2022-03-26 MED ORDER — BUPROPION HCL ER (XL) 150 MG PO TB24
150.0000 mg | ORAL_TABLET | Freq: Every day | ORAL | Status: DC
  Administered 2022-03-27: 150 mg via ORAL
  Filled 2022-03-26: qty 1

## 2022-03-26 MED ORDER — FLUTICASONE PROPIONATE 50 MCG/ACT NA SUSP: 1.0000 | Freq: Every day | NASAL | Status: DC | PRN

## 2022-03-26 MED ORDER — FUROSEMIDE 20 MG PO TABS
20.0000 mg | ORAL_TABLET | Freq: Every day | ORAL | Status: DC
  Administered 2022-03-26 – 2022-03-27 (×2): 20 mg via ORAL
  Filled 2022-03-26 (×2): qty 1

## 2022-03-26 MED ORDER — TRAZODONE HCL 150 MG PO TABS
75.0000 mg | ORAL_TABLET | Freq: Every evening | ORAL | Status: DC | PRN
  Administered 2022-03-26: 75 mg via ORAL
  Filled 2022-03-26: qty 1

## 2022-03-26 NOTE — H&P (Signed)
Chief Complaint: Patient was seen in consultation today for NASH cirrhosis, hydrothorax, esophageal varices.   Referring Physician(s): Dr. Havery Moros  Supervising Physician: Daryll Brod  Patient Status: [facility] - Out-pt  History of Present Illness: Corey Wood is a 71 y.o. male with known cryptogenic cirrhosis thought to be secondary to NASH.  He is followed closely by [facility], [facility] and [facility]   He has a long history of a prior large esophageal varices, gastric varices, and portal gastropathy.  He has had multiple endoscopies with laparoscopic variceal banding.  He just recently was admitted in late October for another acute episode of hematemesis and GI bleeding from the esophageal varices.  He had additional banding at that time as well as transfusion for acute blood loss anemia.  He has met with Dr. Annamaria Boots in consultation as well as his MDs at the [facility] to discuss TIPS creation.  He and his wife have elected to proceed with intervention.    HE presents to [facility] Radiology today in his usual state of health.   VSS.  No shortness of breath.  No confusion.  He does take rifaximin at home, but no lactulose at this time per the direction of [facility].  He is aware of the goals of the procedure and is agreeable to proceed.   Past Medical History:  Diagnosis Date   Allergy    seasonal allergies   Anxiety    on meds   Aortic valve disorder    Arthritis    generalized (fingers)(shoulders)   Asthma    uses inhaler   Cataract    bilateral -sx    Cirrhosis (HCC)    CKD (chronic kidney disease), stage III (HCC)    only has one kidney   COPD (chronic obstructive pulmonary disease) (HCC)    Depression    on meds   DM (diabetes mellitus) (HCC)    Type II   Dyspnea    GERD (gastroesophageal reflux disease)    on meds   Heart murmur    History of colon polyps    History of COVID-19 10/14/2020   History of kidney stones    Hx of acute pancreatitis 10/2019    Hyperlipidemia    on meds   Hypertension    on meds   Hypothyroidism    not on meds at this time   Myelodysplastic syndrome ([place])    Neuromuscular disorder ([place])    per pt   Obstructive sleep apnea    Oxygen deficiency    Parkinsonism    per pt report   Peripheral edema    bilateral legs   Peripheral positional vertigo    Sleep apnea    No CPAP   Stroke [facility])     Past Surgical History:  Procedure Laterality Date   ANKLE SURGERY     CHOLECYSTECTOMY     ENDARTERECTOMY Left 04/29/2020   Procedure: CAROTID EXPLORATION removal of  central venous catheter;  Surgeon: Rosetta Posner, MD;  Location: [facility] OR;  Service: Vascular;  Laterality: Left;   ESOPHAGEAL BANDING N/A 04/17/2020   Procedure: ESOPHAGEAL BANDING;  Surgeon: Yetta Flock, MD;  Location: [facility] END[facility];  Service: Gastroenterology;  Laterality: N/A;   ESOPHAGEAL BANDING  04/29/2020   Procedure: ESOPHAGEAL BANDING;  Surgeon: Rush Landmark Telford Nab., MD;  Location: [place];  Service: Gastroenterology;;   ESOPHAGEAL BANDING N/A 06/10/2020   Procedure: ESOPHAGEAL BANDING;  Surgeon: Yetta Flock, MD;  Location: [facility];  Service: Gastroenterology;  Laterality: N/A;   ESOPHAGEAL BANDING N/A 11/10/2020   Procedure: ESOPHAGEAL BANDING;  Surgeon: Yetta Flock, MD;  Location: [facility];  Service: Gastroenterology;  Laterality: N/A;   ESOPHAGEAL BANDING N/A 02/10/2021   Procedure: ESOPHAGEAL BANDING;  Surgeon: Jerene Bears, MD;  Location: [facility];  Service: Gastroenterology;  Laterality: N/A;   ESOPHAGEAL BANDING N/A 04/21/2021   Procedure: ESOPHAGEAL BANDING;  Surgeon: Milus Banister, MD;  Location: [facility];  Service: Endoscopy;  Laterality: N/A;   ESOPHAGEAL BANDING N/A 07/16/2021   Procedure: ESOPHAGEAL BANDING;  Surgeon: Ladene Artist, MD;  Location: [facility];  Service: Gastroenterology;  Laterality: N/A;   ESOPHAGEAL BANDING  01/18/2022   Procedure: ESOPHAGEAL BANDING;  Surgeon:  Gatha Mayer, MD;  Location: [facility] ENDOSCOPY;  Service: Gastroenterology;;   ESOPHAGOGASTRODUODENOSCOPY N/A 04/25/2020   Procedure: ESOPHAGOGASTRODUODENOSCOPY (EGD);  Surgeon: Carol Ada, MD;  Location: [facility];  Service: Endoscopy;  Laterality: N/A;   ESOPHAGOGASTRODUODENOSCOPY (EGD) WITH PROPOFOL N/A 04/17/2020   Procedure: ESOPHAGOGASTRODUODENOSCOPY (EGD) WITH PROPOFOL;  Surgeon: Yetta Flock, MD;  Location: [facility];  Service: Gastroenterology;  Laterality: N/A;   ESOPHAGOGASTRODUODENOSCOPY (EGD) WITH PROPOFOL N/A 04/29/2020   Procedure: ESOPHAGOGASTRODUODENOSCOPY (EGD) WITH PROPOFOL;  Surgeon: Rush Landmark Telford Nab., MD;  Location: [place];  Service: Gastroenterology;  Laterality: N/A;   ESOPHAGOGASTRODUODENOSCOPY (EGD) WITH PROPOFOL N/A 06/10/2020   Procedure: ESOPHAGOGASTRODUODENOSCOPY (EGD) WITH PROPOFOL;  Surgeon: Yetta Flock, MD;  Location: [facility];  Service: Gastroenterology;  Laterality: N/A;   ESOPHAGOGASTRODUODENOSCOPY (EGD) WITH PROPOFOL N/A 11/10/2020   Procedure: ESOPHAGOGASTRODUODENOSCOPY (EGD) WITH PROPOFOL;  Surgeon: Yetta Flock, MD;  Location: [facility];  Service: Gastroenterology;  Laterality: N/A;   ESOPHAGOGASTRODUODENOSCOPY (EGD) WITH PROPOFOL N/A 02/10/2021   Procedure: ESOPHAGOGASTRODUODENOSCOPY (EGD) WITH PROPOFOL;  Surgeon: Jerene Bears, MD;  Location: [facility];  Service: Gastroenterology;  Laterality: N/A;   ESOPHAGOGASTRODUODENOSCOPY (EGD) WITH PROPOFOL N/A 03/31/2021   Procedure: ESOPHAGOGASTRODUODENOSCOPY (EGD) WITH PROPOFOL;  Surgeon: Yetta Flock, MD;  Location: [facility];  Service: Gastroenterology;  Laterality: N/A;   ESOPHAGOGASTRODUODENOSCOPY (EGD) WITH PROPOFOL N/A 04/21/2021   Procedure: ESOPHAGOGASTRODUODENOSCOPY (EGD) WITH PROPOFOL;  Surgeon: Milus Banister, MD;  Location: [facility];  Service: Endoscopy;  Laterality: N/A;   ESOPHAGOGASTRODUODENOSCOPY (EGD) WITH PROPOFOL N/A 07/16/2021    Procedure: ESOPHAGOGASTRODUODENOSCOPY (EGD) WITH PROPOFOL;  Surgeon: Ladene Artist, MD;  Location: [facility];  Service: Gastroenterology;  Laterality: N/A;   ESOPHAGOGASTRODUODENOSCOPY (EGD) WITH PROPOFOL N/A 10/15/2021   Procedure: ESOPHAGOGASTRODUODENOSCOPY (EGD) WITH PROPOFOL;  Surgeon: Yetta Flock, MD;  Location: [facility];  Service: Gastroenterology;  Laterality: N/A;   ESOPHAGOGASTRODUODENOSCOPY (EGD) WITH PROPOFOL N/A 01/18/2022   Procedure: ESOPHAGOGASTRODUODENOSCOPY (EGD) WITH PROPOFOL;  Surgeon: Gatha Mayer, MD;  Location: [place];  Service: Gastroenterology;  Laterality: N/A;   EYE SURGERY     HERNIA REPAIR     IR PARACENTESIS  09/18/2020   IR RADIOLOGIST EVAL & MGMT  10/22/2021   IR THORACENTESIS ASP PLEURAL SPACE W/IMG GUIDE  10/05/2021   KIDNEY STONE SURGERY     WISDOM TOOTH EXTRACTION      Allergies: Lisinopril  Medications: Prior to Admission medications   Medication Sig Start Date End Date Taking? Authorizing Provider  acetaminophen (TYLENOL) 325 MG tablet Take 325 mg by mouth every 6 (six) hours as needed for moderate pain.   Yes [provider]  albuterol (VENTOLIN HFA) 108 (90 Base) MCG/ACT inhaler Inhale 1-2 puffs into the lungs every 6 (six) hours as needed for wheezing or shortness of  breath.   Yes [provider]  ARTIFICIAL SALIVA MT Use as directed 1 spray in the mouth or throat as needed (Dry mouth). MOUTH COAT 10/26/20  Yes [provider]  buPROPion (WELLBUTRIN XL) 150 MG 24 hr tablet Take 150 mg by mouth daily.   Yes [provider]  carbidopa-levodopa (SINEMET IR) 25-100 MG tablet Take 1 tablet by mouth in the morning, at noon, and at bedtime. 06/30/21  Yes [provider]  carboxymethylcellulose (REFRESH TEARS) 0.5 % SOLN Place 1 drop into both eyes 3 (three) times daily as needed (dry eyes).   Yes [provider]  fluticasone (FLONASE) 50 MCG/ACT nasal spray Place 1 spray into both  nostrils daily as needed for allergies.   Yes [provider]  furosemide (LASIX) 20 MG tablet Take 20 mg by mouth daily.   Yes [provider]  guaiFENesin (DIABETIC TUSSIN EX) 100 MG/5ML liquid Take 10 mLs by mouth every 4 (four) hours as needed for cough or to loosen phlegm.   Yes [provider]  guaiFENesin-codeine (ROBITUSSIN AC) 100-10 MG/5ML syrup Take 2.5 mLs by mouth at bedtime. 01/13/22  Yes [provider]  insulin glargine (LANTUS) 100 unit/mL SOPN Inject 16 Units into the skin daily.   Yes [provider]  ipratropium (ATROVENT) 0.03 % nasal spray Place 1 spray into both nostrils at bedtime.   Yes [provider]  latanoprost (XALATAN) 0.005 % ophthalmic solution Place 1 drop into both eyes at bedtime. 08/20/20  Yes [provider]  lidocaine (LIDODERM) 5 % Place 1 patch onto the skin daily as needed (pain). Remove & Discard patch within 12 hours or as directed by MD   Yes [provider]  loratadine (CLARITIN) 10 MG tablet Take 10 mg by mouth daily as needed for allergies. 09/04/20  Yes [provider]  meclizine (ANTIVERT) 25 MG tablet Take 25 mg by mouth 2 (two) times daily as needed for dizziness.   Yes [provider]  midodrine (PROAMATINE) 10 MG tablet Take 1 tablet (10 mg total) by mouth 3 (three) times daily. 10/21/21  Yes Armbruster, Carlota Raspberry, MD  montelukast (SINGULAIR) 10 MG tablet Take 10 mg by mouth daily. 05/22/20  Yes [provider]  Multiple Vitamin (MULTIVITAMIN WITH MINERALS) TABS tablet Take 1 tablet by mouth daily.   Yes [provider]  pantoprazole (PROTONIX) 40 MG tablet Take 40 mg by mouth daily with breakfast.   Yes [provider]  rifaximin (XIFAXAN) 550 MG TABS tablet Take 550 mg by mouth 2 (two) times daily.   Yes [provider]  spironolactone (ALDACTONE) 100 MG tablet Take 50 mg by mouth every other day.   Yes [provider]   tamsulosin (FLOMAX) 0.4 MG CAPS capsule Take 0.4 mg by mouth 2 (two) times daily.   Yes [provider]  traZODone (DESYREL) 150 MG tablet Take 75 mg by mouth at bedtime as needed for sleep. 09/28/19  Yes [provider]  lactulose (CHRONULAC) 10 GM/15ML solution Take 15 mLs (10 g total) by mouth 3 (three) times daily as needed for mild constipation. Patient not taking: Reported on 03/24/2022 10/21/21   Yetta Flock, MD     Family History  Problem Relation Age of Onset   Liver cancer Mother    Colon cancer Neg Hx    Stomach cancer Neg Hx    Colon polyps Neg Hx    Esophageal cancer Neg Hx    Rectal cancer  Neg Hx     Social History   Socioeconomic History   Marital status: Married    Spouse name: Not on file   Number of children: Not on file   Years of education: Not on file   Highest education level: Not on file  Occupational History   Occupation: retired  Tobacco Use   Smoking status: Never    Passive exposure: Never   Smokeless tobacco: Never  Vaping Use   Vaping Use: Never used  Substance and Sexual Activity   Alcohol use: Yes    Alcohol/week: 1.0 standard drink of alcohol    Types: 1 Cans of beer per week    Comment: Occasional   Drug use: Not Currently   Sexual activity: Not Currently  Other Topics Concern   Not on file  Social History Narrative   Not on file   Social Determinants of Health   Financial Resource Strain: Not on file  Food Insecurity: No Food Insecurity (01/18/2022)   Hunger Vital Sign    Worried About Running Out of Food in the Last Year: Never true    Ran Out of Food in the Last Year: Never true  Transportation Needs: No Transportation Needs (01/18/2022)   PRAPARE - Hydrologist (Medical): No    Lack of Transportation (Non-Medical): No  Physical Activity: Not on file  Stress: Not on file  Social Connections: Not on file     Review of Systems: A 12 point ROS discussed and pertinent  positives are indicated in the HPI above.  All other systems are negative.  Review of Systems  Constitutional:  Negative for fatigue and fever.  Respiratory:  Negative for cough and shortness of breath.   Cardiovascular:  Negative for chest pain.  Gastrointestinal:  Negative for abdominal pain, nausea and vomiting.  Musculoskeletal:  Negative for back pain.  Psychiatric/Behavioral:  Negative for behavioral problems and confusion.     Vital Signs: BP 124/81   Pulse 97   Temp 98.7 F (37.1 C) (Oral)   Resp 18   Ht _0  (1.676 m)   Wt 159 lb (72.1 kg)   SpO2 93%   BMI 25.66 kg/m   Physical Exam Vitals and nursing note reviewed.  Constitutional:      General: He is not in acute distress.    Appearance: Normal appearance. He is not ill-appearing.  HENT:     Mouth/Throat:     Mouth: Mucous membranes are moist.     Pharynx: Oropharynx is clear.  Cardiovascular:     Rate and Rhythm: Normal rate and regular rhythm.  Pulmonary:     Effort: Pulmonary effort is normal. No respiratory distress.     Breath sounds: Normal breath sounds.  Abdominal:     General: Abdomen is flat.     Palpations: Abdomen is soft.  Skin:    General: Skin is warm and dry.  Neurological:     General: No focal deficit present.     Mental Status: He is alert and oriented to person, place, and time. Mental status is at baseline.  Psychiatric:        Mood and Affect: Mood normal.        Behavior: Behavior normal.        Thought Content: Thought content normal.        Judgment: Judgment normal.      MD Evaluation Airway: WNL Heart: WNL Abdomen: WNL Chest/ Lungs: WNL ASA  Classification: 3 Mallampati/Airway  Score: Two   Imaging: US Abdomen Limited RUQ (LIVER/GB)  Result Date: 03/04/2022 CLINICAL DATA:  Cirrhosis.  Prior cholecystectomy EXAM: ULTRASOUND ABDOMEN LIMITED RIGHT UPPER QUADRANT COMPARISON:  CT abdomen pelvis February 09, 2022 FINDINGS: Gallbladder: Surgically absent Common bile  duct: Diameter: 5 mm Liver: Coarsened echogenicity and nodular contour. No focal lesion. Portal vein is patent on color Doppler imaging with normal direction of blood flow towards the liver. Other: Trace perihepatic fluid.  Right pleural effusion. IMPRESSION: 1. Morphologic changes compatible with cirrhosis. No focal lesion. 2. Trace perihepatic fluid. 3. Right pleural effusion. Electronically Signed   By: Lovey Newcomer M.D.   On: 03/04/2022 11:54    Labs:  CBC: Recent Labs    01/19/22 0306 01/19/22 1623 01/20/22 0827 03/26/22 0929  WBC 3.4* 3.9* 2.2* 2.5*  HGB 8.8* 8.7* 8.9* 9.6*  HCT 26.7* 27.3* 27.4* 29.8*  PLT 78* 86* 76* 81*    COAGS: Recent Labs    01/17/22 1911 01/19/22 1623 01/20/22 0753 03/26/22 1006  INR 1.4* 1.2 1.1 1.2  APTT 33  --   --   --     BMP: Recent Labs    01/17/22 1911 01/18/22 0434 01/19/22 0306 03/26/22 0929  NA 135 137 139 134*  K 3.9 4.4 4.5 3.5  CL 103 105 108 103  CO2 _0 GLUCOSE 254* 132* 113* 153*  BUN 23 26* 31* 12  CALCIUM 8.7* 8.7* 8.6* 8.8*  CREATININE 1.69* 1.63* 1.48* 1.41*  GFRNONAA 42* 44* 50* 53*    LIVER FUNCTION TESTS: Recent Labs    01/17/22 1911 01/19/22 0306 03/26/22 0929  BILITOT 0.9 0.6 0.7  AST 62* 37 31  ALT 45* 48* 7  ALKPHOS 186* 145* 114  PROT 7.1 6.8 7.9  ALBUMIN 2.5* 2.4* 2.8*    TUMOR MARKERS: No results for input(s): "AFPTM", "CEA", "CA199", "CHROMGRNA" in the last 8760 hours.  Assessment and Plan: Patient with past medical history of NASH cirrhosis, esophageal varices presents for elective TIPS for management of his recurrent variceal bleeding as well as hydrothorax.  He has met in consultation with Dr. Annamaria Boots who approves patient for procedure.  Patient presents today in their usual state of health.  He has been NPO and is not currently on blood thinners.   Risks and benefits of TIPS, BRTO and/or additional variceal embolization were discussed with the patient and/or the patient's  family including, but not limited to, infection, bleeding, damage to adjacent structures, worsening hepatic and/or cardiac function, worsening and/or the development of altered mental status/encephalopathy, non-target embolization and death.   This interventional procedure involves the use of X-rays and because of the nature of the planned procedure, it is possible that we will have prolonged use of X-ray fluoroscopy.  Potential radiation risks to you include (but are not limited to) the following: - A slightly elevated risk for cancer  several years later in life. This risk is typically less than 0.5% percent. This risk is low in comparison to the normal incidence of human cancer, which is 33% for women and 50% for men according to the [employer]. - Radiation induced injury can include skin redness, resembling a rash, tissue breakdown / ulcers and hair loss (which can be temporary or permanent).   The likelihood of either of these occurring depends on the difficulty of the procedure and whether you are sensitive to radiation due to previous procedures, disease, or genetic conditions.   IF your procedure requires a prolonged use  of radiation, you will be notified and given written instructions for further action.  It is your responsibility to monitor the irradiated area for the 2 weeks following the procedure and to notify your physician if you are concerned that you have suffered a radiation induced injury.    All of the patient's questions were answered, patient is agreeable to proceed.  Consent signed and in chart.  He understands he will be admitted overnight for observation.  He should continue rifaximin PO at home.  Lactulose can be considered based on need if HE presents, per Dr. Annamaria Boots.   Thank you for this interesting consult.  I greatly enjoyed meeting MARTON MALIZIA and look forward to participating in their care.  A copy of this report was sent to the requesting provider  on this date.  Electronically Signed: Docia Barrier, PA 03/26/2022, 11:16 AM   I spent a total of  30 Minutes   in face to face in clinical consultation, greater than 50% of which was counseling/coordinating care for NASH cirrhosis, varices.

## 2022-03-26 NOTE — Procedures (Signed)
Interventional Radiology Procedure Note  Procedure: TIPS    Complications: None  Estimated Blood Loss:  min  Findings: Final PSG: 11m Hg Full report in pacs     MTamera Punt MD

## 2022-03-26 NOTE — Sedation Documentation (Signed)
Pressure Post TIPS Portal Vein mean pressure 27 Right atrial pressure 24/20 Mean 22 Gradient 5

## 2022-03-26 NOTE — Sedation Documentation (Signed)
Transferred to [place] [facility] with CRNA and Wyvonne Lenz RN and report given to PACU RN.

## 2022-03-26 NOTE — H&P (Signed)
Chief Complaint: Patient was seen in consultation today for NASH cirrhosis, hydrothorax, esophageal varices.   Referring Physician(s): Dr. Havery Moros  Supervising Physician: Daryll Brod  Patient Status: [facility] - Out-pt  History of Present Illness: Corey Wood is a 71 y.o. male with known cryptogenic cirrhosis thought to be secondary to NASH.  He is followed closely by [facility], [facility] and [facility]   He has a long history of a prior large esophageal varices, gastric varices, and portal gastropathy.  He has had multiple endoscopies with laparoscopic variceal banding.  He just recently was admitted in late October for another acute episode of hematemesis and GI bleeding from the esophageal varices.  He had additional banding at that time as well as transfusion for acute blood loss anemia.  He has met with Dr. Annamaria Boots in consultation as well as his MDs at the [facility] to discuss TIPS creation.  He and his wife have elected to proceed with intervention.    HE presents to [facility] today in his usual state of health.   VSS.  No shortness of breath.  No confusion.  He does take rifaximin at home, but no lactulose at this time per the direction of [facility].  He is aware of the goals of the procedure and is agreeable to proceed.   Past Medical History:  Diagnosis Date   Allergy    seasonal allergies   Anxiety    on meds   Aortic valve disorder    Arthritis    generalized (fingers)(shoulders)   Asthma    uses inhaler   Cataract    bilateral -sx    Cirrhosis (HCC)    CKD (chronic kidney disease), stage III (HCC)    only has one kidney   COPD (chronic obstructive pulmonary disease) (HCC)    Depression    on meds   DM (diabetes mellitus) (HCC)    Type II   Dyspnea    GERD (gastroesophageal reflux disease)    on meds   Heart murmur    History of colon polyps    History of COVID-19 10/14/2020   History of kidney stones    Hx of acute pancreatitis 10/2019    Hyperlipidemia    on meds   Hypertension    on meds   Hypothyroidism    not on meds at this time   Myelodysplastic syndrome ([place])    Neuromuscular disorder ([place])    per pt   Obstructive sleep apnea    Oxygen deficiency    Parkinsonism    per pt report   Peripheral edema    bilateral legs   Peripheral positional vertigo    Sleep apnea    No CPAP   Stroke [facility])     Past Surgical History:  Procedure Laterality Date   ANKLE SURGERY     CHOLECYSTECTOMY     ENDARTERECTOMY Left 04/29/2020   Procedure: CAROTID EXPLORATION removal of  central venous catheter;  Surgeon: Rosetta Posner, MD;  Location: [facility] OR;  Service: Vascular;  Laterality: Left;   ESOPHAGEAL BANDING N/A 04/17/2020   Procedure: ESOPHAGEAL BANDING;  Surgeon: Yetta Flock, MD;  Location: [facility];  Service: Gastroenterology;  Laterality: N/A;   ESOPHAGEAL BANDING  04/29/2020   Procedure: ESOPHAGEAL BANDING;  Surgeon: Rush Landmark Telford Nab., MD;  Location: [place];  Service: Gastroenterology;;   ESOPHAGEAL BANDING N/A 06/10/2020   Procedure: ESOPHAGEAL BANDING;  Surgeon: Yetta Flock, MD;  Location: [facility];  Service: Gastroenterology;  Laterality: N/A;   ESOPHAGEAL BANDING N/A 11/10/2020   Procedure: ESOPHAGEAL BANDING;  Surgeon: Yetta Flock, MD;  Location: [facility];  Service: Gastroenterology;  Laterality: N/A;   ESOPHAGEAL BANDING N/A 02/10/2021   Procedure: ESOPHAGEAL BANDING;  Surgeon: Jerene Bears, MD;  Location: [facility];  Service: Gastroenterology;  Laterality: N/A;   ESOPHAGEAL BANDING N/A 04/21/2021   Procedure: ESOPHAGEAL BANDING;  Surgeon: Milus Banister, MD;  Location: [facility];  Service: Endoscopy;  Laterality: N/A;   ESOPHAGEAL BANDING N/A 07/16/2021   Procedure: ESOPHAGEAL BANDING;  Surgeon: Ladene Artist, MD;  Location: [facility];  Service: Gastroenterology;  Laterality: N/A;   ESOPHAGEAL BANDING  01/18/2022   Procedure: ESOPHAGEAL BANDING;  Surgeon:  Gatha Mayer, MD;  Location: [facility] ENDOSCOPY;  Service: Gastroenterology;;   ESOPHAGOGASTRODUODENOSCOPY N/A 04/25/2020   Procedure: ESOPHAGOGASTRODUODENOSCOPY (EGD);  Surgeon: Carol Ada, MD;  Location: [facility];  Service: Endoscopy;  Laterality: N/A;   ESOPHAGOGASTRODUODENOSCOPY (EGD) WITH PROPOFOL N/A 04/17/2020   Procedure: ESOPHAGOGASTRODUODENOSCOPY (EGD) WITH PROPOFOL;  Surgeon: Yetta Flock, MD;  Location: [facility];  Service: Gastroenterology;  Laterality: N/A;   ESOPHAGOGASTRODUODENOSCOPY (EGD) WITH PROPOFOL N/A 04/29/2020   Procedure: ESOPHAGOGASTRODUODENOSCOPY (EGD) WITH PROPOFOL;  Surgeon: Rush Landmark Telford Nab., MD;  Location: [place];  Service: Gastroenterology;  Laterality: N/A;   ESOPHAGOGASTRODUODENOSCOPY (EGD) WITH PROPOFOL N/A 06/10/2020   Procedure: ESOPHAGOGASTRODUODENOSCOPY (EGD) WITH PROPOFOL;  Surgeon: Yetta Flock, MD;  Location: [facility];  Service: Gastroenterology;  Laterality: N/A;   ESOPHAGOGASTRODUODENOSCOPY (EGD) WITH PROPOFOL N/A 11/10/2020   Procedure: ESOPHAGOGASTRODUODENOSCOPY (EGD) WITH PROPOFOL;  Surgeon: Yetta Flock, MD;  [facility]: [facility];  Service: Gastroenterology;  Laterality: N/A;   ESOPHAGOGASTRODUODENOSCOPY (EGD) WITH PROPOFOL N/A 02/10/2021   Procedure: ESOPHAGOGASTRODUODENOSCOPY (EGD) WITH PROPOFOL;  Surgeon: Jerene Bears, MD;  [facility]: [facility];  Service: Gastroenterology;  Laterality: N/A;   ESOPHAGOGASTRODUODENOSCOPY (EGD) WITH PROPOFOL N/A 03/31/2021   Procedure: ESOPHAGOGASTRODUODENOSCOPY (EGD) WITH PROPOFOL;  Surgeon: Yetta Flock, MD;  [facility]: [facility];  Service: Gastroenterology;  Laterality: N/A;   ESOPHAGOGASTRODUODENOSCOPY (EGD) WITH PROPOFOL N/A 04/21/2021   Procedure: ESOPHAGOGASTRODUODENOSCOPY (EGD) WITH PROPOFOL;  Surgeon: Milus Banister, MD;  Location: [facility];  Service: Endoscopy;  Laterality: N/A;   ESOPHAGOGASTRODUODENOSCOPY (EGD) WITH PROPOFOL N/A 07/16/2021    Procedure: ESOPHAGOGASTRODUODENOSCOPY (EGD) WITH PROPOFOL;  Surgeon: Ladene Artist, MD;  Location: [facility];  Service: Gastroenterology;  Laterality: N/A;   ESOPHAGOGASTRODUODENOSCOPY (EGD) WITH PROPOFOL N/A 10/15/2021   Procedure: ESOPHAGOGASTRODUODENOSCOPY (EGD) WITH PROPOFOL;  Surgeon: Yetta Flock, MD;  Location: [facility];  Service: Gastroenterology;  Laterality: N/A;   ESOPHAGOGASTRODUODENOSCOPY (EGD) WITH PROPOFOL N/A 01/18/2022   Procedure: ESOPHAGOGASTRODUODENOSCOPY (EGD) WITH PROPOFOL;  Surgeon: Gatha Mayer, MD;  Location: [place];  Service: Gastroenterology;  Laterality: N/A;   EYE SURGERY     HERNIA REPAIR     IR PARACENTESIS  09/18/2020   IR RADIOLOGIST EVAL & MGMT  10/22/2021   IR THORACENTESIS ASP PLEURAL SPACE W/IMG GUIDE  10/05/2021   KIDNEY STONE SURGERY     WISDOM TOOTH EXTRACTION      Allergies: Lisinopril  Medications: Prior to Admission medications   Medication Sig Start Date End Date Taking? Authorizing Provider  acetaminophen (TYLENOL) 325 MG tablet Take 325 mg by mouth every 6 (six) hours as needed for moderate pain.   Yes [provider]  albuterol (VENTOLIN HFA) 108 (90 Base) MCG/ACT inhaler Inhale 1-2 puffs into the lungs every 6 (six) hours as needed for wheezing or shortness of  breath.   Yes [provider]  ARTIFICIAL SALIVA MT Use as directed 1 spray in the mouth or throat as needed (Dry mouth). MOUTH COAT 10/26/20  Yes [provider]  buPROPion (WELLBUTRIN XL) 150 MG 24 hr tablet Take 150 mg by mouth daily.   Yes [provider]  carbidopa-levodopa (SINEMET IR) 25-100 MG tablet Take 1 tablet by mouth in the morning, at noon, and at bedtime. 06/30/21  Yes [provider]  carboxymethylcellulose (REFRESH TEARS) 0.5 % SOLN Place 1 drop into both eyes 3 (three) times daily as needed (dry eyes).   Yes [provider]  fluticasone (FLONASE) 50 MCG/ACT nasal spray Place 1 spray into both  nostrils daily as needed for allergies.   Yes [provider]  furosemide (LASIX) 20 MG tablet Take 20 mg by mouth daily.   Yes [provider]  guaiFENesin (DIABETIC TUSSIN EX) 100 MG/5ML liquid Take 10 mLs by mouth every 4 (four) hours as needed for cough or to loosen phlegm.   Yes [provider]  guaiFENesin-codeine (ROBITUSSIN AC) 100-10 MG/5ML syrup Take 2.5 mLs by mouth at bedtime. 01/13/22  Yes [provider]  insulin glargine (LANTUS) 100 unit/mL SOPN Inject 16 Units into the skin daily.   Yes [provider]  ipratropium (ATROVENT) 0.03 % nasal spray Place 1 spray into both nostrils at bedtime.   Yes [provider]  latanoprost (XALATAN) 0.005 % ophthalmic solution Place 1 drop into both eyes at bedtime. 08/20/20  Yes [provider]  lidocaine (LIDODERM) 5 % Place 1 patch onto the skin daily as needed (pain). Remove & Discard patch within 12 hours or as directed by MD   Yes [provider]  loratadine (CLARITIN) 10 MG tablet Take 10 mg by mouth daily as needed for allergies. 09/04/20  Yes [provider]  meclizine (ANTIVERT) 25 MG tablet Take 25 mg by mouth 2 (two) times daily as needed for dizziness.   Yes [provider]  midodrine (PROAMATINE) 10 MG tablet Take 1 tablet (10 mg total) by mouth 3 (three) times daily. 10/21/21  Yes Armbruster, Carlota Raspberry, MD  montelukast (SINGULAIR) 10 MG tablet Take 10 mg by mouth daily. 05/22/20  Yes [provider]  Multiple Vitamin (MULTIVITAMIN WITH MINERALS) TABS tablet Take 1 tablet by mouth daily.   Yes [provider]  pantoprazole (PROTONIX) 40 MG tablet Take 40 mg by mouth daily with breakfast.   Yes [provider]  rifaximin (XIFAXAN) 550 MG TABS tablet Take 550 mg by mouth 2 (two) times daily.   Yes [provider]  spironolactone (ALDACTONE) 100 MG tablet Take 50 mg by mouth every other day.   Yes [provider]   tamsulosin (FLOMAX) 0.4 MG CAPS capsule Take 0.4 mg by mouth 2 (two) times daily.   Yes [provider]  traZODone (DESYREL) 150 MG tablet Take 75 mg by mouth at bedtime as needed for sleep. 09/28/19  Yes [provider]  lactulose (CHRONULAC) 10 GM/15ML solution Take 15 mLs (10 g total) by mouth 3 (three) times daily as needed for mild constipation. Patient not taking: Reported on 03/24/2022 10/21/21   Yetta Flock, MD     Family History  Problem Relation Age of Onset   Liver cancer Mother    Colon cancer Neg Hx    Stomach cancer Neg Hx    Colon polyps Neg Hx    Esophageal cancer Neg Hx    Rectal cancer  Neg Hx     Social History   Socioeconomic History   Marital status: Married    Spouse name: Not on file   Number of children: Not on file   Years of education: Not on file   Highest education level: Not on file  Occupational History   Occupation: retired  Tobacco Use   Smoking status: Never    Passive exposure: Never   Smokeless tobacco: Never  Vaping Use   Vaping Use: Never used  Substance and Sexual Activity   Alcohol use: Yes    Alcohol/week: 1.0 standard drink of alcohol    Types: 1 Cans of beer per week    Comment: Occasional   Drug use: Not Currently   Sexual activity: Not Currently  Other Topics Concern   Not on file  Social History Narrative   Not on file   Social Determinants of Health   Financial Resource Strain: Not on file  Food Insecurity: No Food Insecurity (01/18/2022)   Hunger Vital Sign    Worried About Running Out of Food in the Last Year: Never true    Ran Out of Food in the Last Year: Never true  Transportation Needs: No Transportation Needs (01/18/2022)   PRAPARE - Hydrologist (Medical): No    Lack of Transportation (Non-Medical): No  Physical Activity: Not on file  Stress: Not on file  Social Connections: Not on file     Review of Systems: A 12 point ROS discussed and pertinent  positives are indicated in the HPI above.  All other systems are negative.  Review of Systems  Constitutional:  Negative for fatigue and fever.  Respiratory:  Negative for cough and shortness of breath.   Cardiovascular:  Negative for chest pain.  Gastrointestinal:  Negative for abdominal pain, nausea and vomiting.  Musculoskeletal:  Negative for back pain.  Psychiatric/Behavioral:  Negative for behavioral problems and confusion.     Vital Signs: BP 124/81   Pulse 97   Temp 98.7 F (37.1 C) (Oral)   Resp 18   Ht _0  (1.676 m)   Wt 159 lb (72.1 kg)   SpO2 93%   BMI 25.66 kg/m   Physical Exam Vitals and nursing note reviewed.  Constitutional:      General: He is not in acute distress.    Appearance: Normal appearance. He is not ill-appearing.  HENT:     Mouth/Throat:     Mouth: Mucous membranes are moist.     Pharynx: Oropharynx is clear.  Cardiovascular:     Rate and Rhythm: Normal rate and regular rhythm.  Pulmonary:     Effort: Pulmonary effort is normal. No respiratory distress.     Breath sounds: Normal breath sounds.  Abdominal:     General: Abdomen is flat.     Palpations: Abdomen is soft.  Skin:    General: Skin is warm and dry.  Neurological:     General: No focal deficit present.     Mental Status: He is alert and oriented to person, place, and time. Mental status is at baseline.  Psychiatric:        Mood and Affect: Mood normal.        Behavior: Behavior normal.        Thought Content: Thought content normal.        Judgment: Judgment normal.      MD Evaluation Airway: WNL Heart: WNL Abdomen: WNL Chest/ Lungs: WNL ASA  Classification: 3 Mallampati/Airway  Score: Two   Imaging: US Abdomen Limited RUQ (LIVER/GB)  Result Date: 03/04/2022 CLINICAL DATA:  Cirrhosis.  Prior cholecystectomy EXAM: ULTRASOUND ABDOMEN LIMITED RIGHT UPPER QUADRANT COMPARISON:  CT abdomen pelvis February 09, 2022 FINDINGS: Gallbladder: Surgically absent Common bile  duct: Diameter: 5 mm Liver: Coarsened echogenicity and nodular contour. No focal lesion. Portal vein is patent on color Doppler imaging with normal direction of blood flow towards the liver. Other: Trace perihepatic fluid.  Right pleural effusion. IMPRESSION: 1. Morphologic changes compatible with cirrhosis. No focal lesion. 2. Trace perihepatic fluid. 3. Right pleural effusion. Electronically Signed   By: Lovey Newcomer M.D.   On: 03/04/2022 11:54    Labs:  CBC: Recent Labs    01/19/22 0306 01/19/22 1623 01/20/22 0827 03/26/22 0929  WBC 3.4* 3.9* 2.2* 2.5*  HGB 8.8* 8.7* 8.9* 9.6*  HCT 26.7* 27.3* 27.4* 29.8*  PLT 78* 86* 76* 81*    COAGS: Recent Labs    01/17/22 1911 01/19/22 1623 01/20/22 0753 03/26/22 1006  INR 1.4* 1.2 1.1 1.2  APTT 33  --   --   --     BMP: Recent Labs    01/17/22 1911 01/18/22 0434 01/19/22 0306 03/26/22 0929  NA 135 137 139 134*  K 3.9 4.4 4.5 3.5  CL 103 105 108 103  CO2 _0 GLUCOSE 254* 132* 113* 153*  BUN 23 26* 31* 12  CALCIUM 8.7* 8.7* 8.6* 8.8*  CREATININE 1.69* 1.63* 1.48* 1.41*  GFRNONAA 42* 44* 50* 53*    LIVER FUNCTION TESTS: Recent Labs    01/17/22 1911 01/19/22 0306 03/26/22 0929  BILITOT 0.9 0.6 0.7  AST 62* 37 31  ALT 45* 48* 7  ALKPHOS 186* 145* 114  PROT 7.1 6.8 7.9  ALBUMIN 2.5* 2.4* 2.8*    TUMOR MARKERS: No results for input(s): "AFPTM", "CEA", "CA199", "CHROMGRNA" in the last 8760 hours.  Assessment and Plan: Patient with past medical history of NASH cirrhosis, esophageal varices presents for elective TIPS for management of his recurrent variceal bleeding as well as hydrothorax.  He has met in consultation with Dr. Annamaria Boots who approves patient for procedure.  Patient presents today in their usual state of health.  He has been NPO and is not currently on blood thinners.   Risks and benefits of TIPS, BRTO and/or additional variceal embolization were discussed with the patient and/or the patient's  family including, but not limited to, infection, bleeding, damage to adjacent structures, worsening hepatic and/or cardiac function, worsening and/or the development of altered mental status/encephalopathy, non-target embolization and death.   This interventional procedure involves the use of X-rays and because of the nature of the planned procedure, it is possible that we will have prolonged use of X-ray fluoroscopy.  Potential radiation risks to you include (but are not limited to) the following: - A slightly elevated risk for cancer  several years later in life. This risk is typically less than 0.5% percent. This risk is low in comparison to the normal incidence of human cancer, which is 33% for women and 50% for men according to the [place]. - Radiation induced injury can include skin redness, resembling a rash, tissue breakdown / ulcers and hair loss (which can be temporary or permanent).   The likelihood of either of these occurring depends on the difficulty of the procedure and whether you are sensitive to radiation due to previous procedures, disease, or genetic conditions.   IF your procedure requires a prolonged use  of radiation, you will be notified and given written instructions for further action.  It is your responsibility to monitor the irradiated area for the 2 weeks following the procedure and to notify your physician if you are concerned that you have suffered a radiation induced injury.    All of the patient's questions were answered, patient is agreeable to proceed.  Consent signed and in chart.  He understands he will be admitted overnight for observation.  He should continue rifaximin PO at home.  Lactulose can be considered based on need if HE presents, per Dr. Annamaria Boots.   Thank you for this interesting consult.  I greatly enjoyed meeting Corey Wood and look forward to participating in their care.  A copy of this report was sent to the requesting provider  on this date.  Electronically Signed: Docia Barrier, PA 03/26/2022, 11:16 AM   I spent a total of  30 Minutes   in face to face in clinical consultation, greater than 50% of which was counseling/coordinating care for NASH cirrhosis, varices.

## 2022-03-26 NOTE — Transfer of Care (Signed)
Immediate Anesthesia Transfer of Care Note  Patient: Corey Wood  Procedure(s) Performed: TIPS  Patient Location: PACU  Anesthesia Type:General  Level of Consciousness: awake, alert , and oriented  Airway & Oxygen Therapy: Patient Spontanous Breathing and Patient connected to nasal cannula oxygen  Post-op Assessment: Report given to RN and Post -op Vital signs reviewed and stable  Post vital signs: Reviewed and stable  Last Vitals:  Vitals Value Taken Time  BP 109/65 03/26/22 1415  Temp    Pulse 74 03/26/22 1418  Resp 15 03/26/22 1418  SpO2 99 % 03/26/22 1418  Vitals shown include unvalidated device data.  Last Pain:  Vitals:   03/26/22 0940  PainSc: 0-No pain         Complications: No notable events documented.

## 2022-03-26 NOTE — Sedation Documentation (Signed)
Pre TIPS procedure Portal vein mean pressure 32

## 2022-03-26 NOTE — Anesthesia Procedure Notes (Signed)
Procedure Name: Intubation Date/Time: 03/26/2022 11:47 AM  Performed by: Anastasio Auerbach, CRNAPre-anesthesia Checklist: Patient identified, Emergency Drugs available, Suction available and Patient being monitored Patient Re-evaluated:Patient Re-evaluated prior to induction Oxygen Delivery Method: Circle system utilized Preoxygenation: Pre-oxygenation with 100% oxygen Induction Type: IV induction Ventilation: Mask ventilation without difficulty Laryngoscope Size: Mac and 3 Grade View: Grade I Tube type: Oral Tube size: 8.0 mm Number of attempts: 1 Airway Equipment and Method: Stylet and Oral airway Placement Confirmation: ETT inserted through vocal cords under direct vision, positive ETCO2 and breath sounds checked- equal and bilateral Secured at: 21 cm Tube secured with: Tape Dental Injury: Teeth and Oropharynx as per pre-operative assessment

## 2022-03-26 NOTE — Anesthesia Procedure Notes (Signed)
Arterial Line Insertion Start/End12/29/2023 9:50 AM, 03/26/2022 10:00 AM Performed by: Anastasio Auerbach, CRNA, CRNA  Patient location: Pre-op. Preanesthetic checklist: patient identified, IV checked, site marked, risks and benefits discussed, surgical consent, monitors and equipment checked, pre-op evaluation, timeout performed and anesthesia consent Lidocaine 1% used for infiltration Left, radial was placed Catheter size: 20 G Hand hygiene performed , maximum sterile barriers used  and Seldinger technique used Allen's test indicative of satisfactory collateral circulation Attempts: 1 Procedure performed without using ultrasound guided technique. Following insertion, Biopatch and dressing applied. Post procedure assessment: normal  Patient tolerated the procedure well with no immediate complications.

## 2022-03-27 ENCOUNTER — Other Ambulatory Visit: Payer: Self-pay

## 2022-03-27 ENCOUNTER — Encounter (HOSPITAL_COMMUNITY): Payer: Self-pay | Admitting: Interventional Radiology

## 2022-03-27 NOTE — TOC Transition Note (Signed)
Transition of Care [facility]) - CM/SW Discharge Note   Patient Details  Name: Corey Wood MRN: [id number] Date of Birth: Mar 12, 1951  [facility]) CM/SW Contact:  Bartholomew Crews, RN Phone Number: [phone] 03/27/2022, 9:24 AM   Clinical Narrative:     Patient to transition home today. Chart reviewed. No TOC needs identified at this time.   Final next level of care: Home/Self Care Barriers to Discharge: No Barriers Identified   Patient Goals and CMS Choice      Discharge Placement                         Discharge Plan and Services Additional resources added to the After Visit Summary for                                       Social Determinants of Health (SDOH) Interventions SDOH Screenings   Food Insecurity: No Food Insecurity (03/27/2022)  Housing: Low Risk  (03/26/2022)  Transportation Needs: No Transportation Needs (03/27/2022)  Utilities: Not At Risk (03/27/2022)  Tobacco Use: Low Risk  (03/27/2022)     Readmission Risk Interventions    05/02/2020    2:41 PM  Readmission Risk Prevention Plan  Transportation Screening Complete  PCP or Specialist Appt within 3-5 Days Complete  HRI or [place] Complete  Social Work Consult for [place] Planning/Counseling Complete  Palliative Care Screening Not Applicable  Medication Review [employer] photographer) Referral to Pharmacy

## 2022-03-27 NOTE — Discharge Summary (Signed)
Patient ID: Corey Wood MRN: [id number] DOB/AGE: 08/16/1950 71 y.o.  Admit date: 03/26/2022 Discharge date: 03/27/2022  Supervising Physician: Ruthann Cancer  Patient Status: [facility] - In-pt  Admission Diagnoses: NASH Cirrhosis with portal hypertension and recurrent ascites  Discharge Diagnoses:  Principal Problem:   NASH (nonalcoholic steatohepatitis) Active Problems:   Liver cirrhosis secondary to NASH ([place])   Cirrhosis of liver with ascites, unspecified hepatic cirrhosis type [facility])   Discharged Condition: stable  Hospital Course: 71 y.o. male inpatient. History of cryptogenic cirrhosis thought to be secondary to NASH.  [facility], [facility] He has a long history of a prior large esophageal varices, gastric varices, and portal gastropathy.  He has had multiple endoscopies with laparoscopic variceal banding.  He just recently was admitted in late October for another acute episode of hematemesis and GI bleeding from the esophageal varices.  He had additional banding at that time as well as transfusion for acute blood loss anemia. Corey Wood and his wife met with Dr. Annamaria Boots in consultation and elected to proceed with TIPS creation. Corey Wood presented to [facility] on 12.29.23 as an outpatient for a planned transjugular intrahepatic portosystemic shunt placement with Dr. Annamaria Boots. The procedure went successfully without complication and he was admitted for overnight observation. Per RN no concerns overnight, foley removed this morning without issue. He has not have a bowel movement but states that he is passing gas. Patient able to tolerate ADL without complaint Wife at bedside concurs no encephalopathy noted.  He denies any complaints this morning except. He was able to urinate after the foley catheter  He has not had any nausea, vomiting, abdominal pain or confusion. Patient will remain on rifaximin has previously prescribed by the [place]. Orders  placed for the following: 1 month telehealth visit with Dr. Annamaria Boots and a 3 month clinic appointment follow up and TIPS [place].  Corey Wood verbalized understanding of the plan of care.  Consults: None  Significant Diagnostic Studies: IR Tips  Result Date: 03/26/2022 CLINICAL DATA:  Corey Wood cirrhosis, episodes of bleeding esophageal varices. Most recent bleed October 2023. EXAM: Ultrasound guidance for vascular access Intravascular ultrasound with the ICE catheter (puncture guidance) Transjugular intrahepatic portosystemic shunt creation (TIPS) MEDICATIONS: As antibiotic prophylaxis, 2 g Rocephin was ordered pre-procedure and administered intravenously within one hour of incision. ANESTHESIA/SEDATION: General - as administered by the Anesthesia department CONTRAST:  75 cc omni 300 FLUOROSCOPY: Radiation Exposure Index (as provided by the fluoroscopic device): 453 mGy Kerma COMPLICATIONS: None immediate. PROCEDURE: Informed written consent was obtained from the patient after a thorough discussion of the procedural risks, benefits and alternatives. All questions were addressed. Maximal Sterile Barrier Technique was utilized including caps, mask, sterile gowns, sterile gloves, sterile drape, hand hygiene and skin antiseptic. A timeout was performed prior to the initiation of the procedure. Under sterile conditions and general anesthesia, ultrasound micropuncture access performed the right common femoral vein. Images obtained for documentation of the patent right common femoral vein. Eight French sheath inserted over a Bentson guidewire. Through this access, the 8 [place] ICE catheter was advanced under fluoroscopic guidance to the level of the intrahepatic IVC junction for intravascular guidance of the TIPS creation. Also under sterile conditions and general anesthesia, ultrasound micropuncture access performed of the right internal jugular vein. Images obtained for documentation of the patent right internal  jugular vein. Guidewire advanced followed by the Accustick dilator set. Guidewire advanced into the IVC. Tract dilatation  performed to insert the 10 French TIPS sheath. Through the sheath, the 5 [place] MPA catheter initially selected the posterior right hepatic vein. Right hepatic venogram performed. Right hepatic vein is widely patent. Catheter was retracted and directed anteriorly. Eventually, the more anterior middle hepatic vein was catheterized. Middle hepatic vein position confirmed by venography and intravascular ultrasound. Using ICE ultrasound visualization, the middle hepatic vein catheterization and portal vein anatomy was defined. A puncture window was visualized. Under direct intravascular ultrasound visualization the scorpion X needle was advanced into the central right portal vein. Gentle injection confirms portal vein position. Needle was visualized within the portal vein by intravascular ultrasound. Bentson guidewire advanced easily followed by 5 [place] catheter. Contrast injection again confirms portal vein position. Initial portogram performed. Portal vein is widely patent. Initial portal pressure was 32 mm Hg. Retrograde filling of esophageal varices visualized. 6 mm mustang balloon advanced through the percutaneous liver tract. Balloon dilatation performed of the liver tract without difficulty. Ten French sheath was advanced over the balloon while deflating. This allowed advancement of the 10 French sheath into the portal vein. Five [place] marker pigtail advanced. Simultaneous hepatic and portal venography performed for accurate measurement of the necessary length. For TIPS creation, the 8-10 mm x 8 cm x 2 cm Viatorr stent was successfully deployed. Initial dilatation of the newly created TIPS was to 8 mm with an 8 x 4 mustang balloon. Following tips creation, portosystemic gradient was 5 mmHg (portal pressure reduced to 27 mm Hg and right atrial pressure 22 mm Hg). Post TIPS portogram confirms  antegrade portal flow and widely patent TIPS. Varices are decompressed and no longer visualized. All catheters and sheath removed. Hemostasis obtained with manual compression of the right IJ and right common femoral venous access sites. Patient was transferred to the PACU in stable condition for inpatient recovery overnight. IMPRESSION: 1. Successful intrahepatic portosystemic shunt creation (TIPS), as above. Patient will be admitted to the hospitalist service for overnight observation. Outpatient follow-up with a TIPS ultrasound in 3 month. Electronically Signed   By: Jerilynn Mages.  Shick M.D.   On: 03/26/2022 15:14   IR US Guide Vasc Access Right  Result Date: 03/26/2022 CLINICAL DATA:  Corey Wood cirrhosis, episodes of bleeding esophageal varices. Most recent bleed October 2023. EXAM: Ultrasound guidance for vascular access Intravascular ultrasound with the ICE catheter (puncture guidance) Transjugular intrahepatic portosystemic shunt creation (TIPS) MEDICATIONS: As antibiotic prophylaxis, 2 g Rocephin was ordered pre-procedure and administered intravenously within one hour of incision. ANESTHESIA/SEDATION: General - as administered by the Anesthesia department CONTRAST:  75 cc omni 300 FLUOROSCOPY: Radiation Exposure Index (as provided by the fluoroscopic device): 622 mGy Kerma COMPLICATIONS: None immediate. PROCEDURE: Informed written consent was obtained from the patient after a thorough discussion of the procedural risks, benefits and alternatives. All questions were addressed. Maximal Sterile Barrier Technique was utilized including caps, mask, sterile gowns, sterile gloves, sterile drape, hand hygiene and skin antiseptic. A timeout was performed prior to the initiation of the procedure. Under sterile conditions and general anesthesia, ultrasound micropuncture access performed the right common femoral vein. Images obtained for documentation of the patent right common femoral vein. Eight French sheath inserted over a  Bentson guidewire. Through this access, the 8 [place] ICE catheter was advanced under fluoroscopic guidance to the level of the intrahepatic IVC junction for intravascular guidance of the TIPS creation. Also under sterile conditions and general anesthesia, ultrasound micropuncture access performed of the right internal jugular vein. Images obtained for documentation of the patent  right internal jugular vein. Guidewire advanced followed by the Accustick dilator set. Guidewire advanced into the IVC. Tract dilatation performed to insert the 10 French TIPS sheath. Through the sheath, the 5 [place] MPA catheter initially selected the posterior right hepatic vein. Right hepatic venogram performed. Right hepatic vein is widely patent. Catheter was retracted and directed anteriorly. Eventually, the more anterior middle hepatic vein was catheterized. Middle hepatic vein position confirmed by venography and intravascular ultrasound. Using ICE ultrasound visualization, the middle hepatic vein catheterization and portal vein anatomy was defined. A puncture window was visualized. Under direct intravascular ultrasound visualization the scorpion X needle was advanced into the central right portal vein. Gentle injection confirms portal vein position. Needle was visualized within the portal vein by intravascular ultrasound. Bentson guidewire advanced easily followed by 5 [place] catheter. Contrast injection again confirms portal vein position. Initial portogram performed. Portal vein is widely patent. Initial portal pressure was 32 mm Hg. Retrograde filling of esophageal varices visualized. 6 mm mustang balloon advanced through the percutaneous liver tract. Balloon dilatation performed of the liver tract without difficulty. Ten French sheath was advanced over the balloon while deflating. This allowed advancement of the 10 French sheath into the portal vein. Five [place] marker pigtail advanced. Simultaneous hepatic and portal  venography performed for accurate measurement of the necessary length. For TIPS creation, the 8-10 mm x 8 cm x 2 cm Viatorr stent was successfully deployed. Initial dilatation of the newly created TIPS was to 8 mm with an 8 x 4 mustang balloon. Following tips creation, portosystemic gradient was 5 mmHg (portal pressure reduced to 27 mm Hg and right atrial pressure 22 mm Hg). Post TIPS portogram confirms antegrade portal flow and widely patent TIPS. Varices are decompressed and no longer visualized. All catheters and sheath removed. Hemostasis obtained with manual compression of the right IJ and right common femoral venous access sites. Patient was transferred to the PACU in stable condition for inpatient recovery overnight. IMPRESSION: 1. Successful intrahepatic portosystemic shunt creation (TIPS), as above. Patient will be admitted to the hospitalist service for overnight observation. Outpatient follow-up with a TIPS ultrasound in 3 month. Electronically Signed   By: Jerilynn Mages.  Shick M.D.   On: 03/26/2022 15:14   IR INTRAVASCULAR ULTRASOUND NON CORONARY  Result Date: 03/26/2022 CLINICAL DATA:  Corey Wood cirrhosis, episodes of bleeding esophageal varices. Most recent bleed October 2023. EXAM: Ultrasound guidance for vascular access Intravascular ultrasound with the ICE catheter (puncture guidance) Transjugular intrahepatic portosystemic shunt creation (TIPS) MEDICATIONS: As antibiotic prophylaxis, 2 g Rocephin was ordered pre-procedure and administered intravenously within one hour of incision. ANESTHESIA/SEDATION: General - as administered by the Anesthesia department CONTRAST:  75 cc omni 300 FLUOROSCOPY: Radiation Exposure Index (as provided by the fluoroscopic device): 026 mGy Kerma COMPLICATIONS: None immediate. PROCEDURE: Informed written consent was obtained from the patient after a thorough discussion of the procedural risks, benefits and alternatives. All questions were addressed. Maximal Sterile Barrier Technique  was utilized including caps, mask, sterile gowns, sterile gloves, sterile drape, hand hygiene and skin antiseptic. A timeout was performed prior to the initiation of the procedure. Under sterile conditions and general anesthesia, ultrasound micropuncture access performed the right common femoral vein. Images obtained for documentation of the patent right common femoral vein. Eight French sheath inserted over a Bentson guidewire. Through this access, the 8 [place] ICE catheter was advanced under fluoroscopic guidance to the level of the intrahepatic IVC junction for intravascular guidance of the TIPS creation. Also under sterile conditions and general  anesthesia, ultrasound micropuncture access performed of the right internal jugular vein. Images obtained for documentation of the patent right internal jugular vein. Guidewire advanced followed by the Accustick dilator set. Guidewire advanced into the IVC. Tract dilatation performed to insert the 10 French TIPS sheath. Through the sheath, the 5 [place] MPA catheter initially selected the posterior right hepatic vein. Right hepatic venogram performed. Right hepatic vein is widely patent. Catheter was retracted and directed anteriorly. Eventually, the more anterior middle hepatic vein was catheterized. Middle hepatic vein position confirmed by venography and intravascular ultrasound. Using ICE ultrasound visualization, the middle hepatic vein catheterization and portal vein anatomy was defined. A puncture window was visualized. Under direct intravascular ultrasound visualization the scorpion X needle was advanced into the central right portal vein. Gentle injection confirms portal vein position. Needle was visualized within the portal vein by intravascular ultrasound. Bentson guidewire advanced easily followed by 5 [place] catheter. Contrast injection again confirms portal vein position. Initial portogram performed. Portal vein is widely patent. Initial portal  pressure was 32 mm Hg. Retrograde filling of esophageal varices visualized. 6 mm mustang balloon advanced through the percutaneous liver tract. Balloon dilatation performed of the liver tract without difficulty. Ten French sheath was advanced over the balloon while deflating. This allowed advancement of the 10 French sheath into the portal vein. Five [place] marker pigtail advanced. Simultaneous hepatic and portal venography performed for accurate measurement of the necessary length. For TIPS creation, the 8-10 mm x 8 cm x 2 cm Viatorr stent was successfully deployed. Initial dilatation of the newly created TIPS was to 8 mm with an 8 x 4 mustang balloon. Following tips creation, portosystemic gradient was 5 mmHg (portal pressure reduced to 27 mm Hg and right atrial pressure 22 mm Hg). Post TIPS portogram confirms antegrade portal flow and widely patent TIPS. Varices are decompressed and no longer visualized. All catheters and sheath removed. Hemostasis obtained with manual compression of the right IJ and right common femoral venous access sites. Patient was transferred to the PACU in stable condition for inpatient recovery overnight. IMPRESSION: 1. Successful intrahepatic portosystemic shunt creation (TIPS), as above. Patient will be admitted to the hospitalist service for overnight observation. Outpatient follow-up with a TIPS ultrasound in 3 month. Electronically Signed   By: Jerilynn Mages.  Shick M.D.   On: 03/26/2022 15:14   US Abdomen Limited RUQ (LIVER/GB)  Result Date: 03/04/2022 CLINICAL DATA:  Cirrhosis.  Prior cholecystectomy EXAM: ULTRASOUND ABDOMEN LIMITED RIGHT UPPER QUADRANT COMPARISON:  CT abdomen pelvis February 09, 2022 FINDINGS: Gallbladder: Surgically absent Common bile duct: Diameter: 5 mm Liver: Coarsened echogenicity and nodular contour. No focal lesion. Portal vein is patent on color Doppler imaging with normal direction of blood flow towards the liver. Other: Trace perihepatic fluid.  Right pleural  effusion. IMPRESSION: 1. Morphologic changes compatible with cirrhosis. No focal lesion. 2. Trace perihepatic fluid. 3. Right pleural effusion. Electronically Signed   By: Lovey Newcomer M.D.   On: 03/04/2022 11:54    Treatments: procedures: TIPS Procedure  Discharge Exam: Blood pressure 111/66, pulse (!) 59, temperature 97.7 F (36.5 C), temperature source Oral, resp. rate 16, height _0  (1.676 m), weight 159 lb 9.8 oz (72.4 kg), SpO2 98 %. Physical Exam  Disposition: Discharge disposition: 01-Home or Self Care       Discharge Instructions     Diet - low sodium heart healthy   Complete by: As directed    Discharge instructions   Complete by: As directed    Activity as  tolerated.   Increase activity slowly   Complete by: As directed       Allergies as of 03/27/2022       Reactions   Lisinopril Cough        Medication List     TAKE these medications    acetaminophen 325 MG tablet Commonly known as: TYLENOL Take 325 mg by mouth every 6 (six) hours as needed for moderate pain.   albuterol 108 (90 Base) MCG/ACT inhaler Commonly known as: VENTOLIN HFA Inhale 1-2 puffs into the lungs every 6 (six) hours as needed for wheezing or shortness of breath.   ARTIFICIAL SALIVA MT Use as directed 1 spray in the mouth or throat as needed (Dry mouth). MOUTH COAT   buPROPion 150 MG 24 hr tablet Commonly known as: WELLBUTRIN XL Take 150 mg by mouth daily.   carbidopa-levodopa 25-100 MG tablet Commonly known as: SINEMET IR Take 1 tablet by mouth in the morning, at noon, and at bedtime.   DIABETIC TUSSIN EX 100 MG/5ML liquid Generic drug: guaiFENesin Take 10 mLs by mouth every 4 (four) hours as needed for cough or to loosen phlegm.   fluticasone 50 MCG/ACT nasal spray Commonly known as: FLONASE Place 1 spray into both nostrils daily as needed for allergies.   furosemide 20 MG tablet Commonly known as: LASIX Take 20 mg by mouth daily.   guaiFENesin-codeine 100-10  MG/5ML syrup Commonly known as: ROBITUSSIN AC Take 2.5 mLs by mouth at bedtime.   insulin glargine 100 unit/mL Sopn Commonly known as: LANTUS Inject 16 Units into the skin daily.   ipratropium 0.03 % nasal spray Commonly known as: ATROVENT Place 1 spray into both nostrils at bedtime.   lactulose 10 GM/15ML solution Commonly known as: CHRONULAC Take 15 mLs (10 g total) by mouth 3 (three) times daily as needed for mild constipation.   latanoprost 0.005 % ophthalmic solution Commonly known as: XALATAN Place 1 drop into both eyes at bedtime.   lidocaine 5 % Commonly known as: LIDODERM Place 1 patch onto the skin daily as needed (pain). Remove & Discard patch within 12 hours or as directed by MD   loratadine 10 MG tablet Commonly known as: CLARITIN Take 10 mg by mouth daily as needed for allergies.   meclizine 25 MG tablet Commonly known as: ANTIVERT Take 25 mg by mouth 2 (two) times daily as needed for dizziness.   midodrine 10 MG tablet Commonly known as: PROAMATINE Take 1 tablet (10 mg total) by mouth 3 (three) times daily.   montelukast 10 MG tablet Commonly known as: SINGULAIR Take 10 mg by mouth daily.   multivitamin with minerals Tabs tablet Take 1 tablet by mouth daily.   pantoprazole 40 MG tablet Commonly known as: PROTONIX Take 40 mg by mouth daily with breakfast.   Refresh Tears 0.5 % Soln Generic drug: carboxymethylcellulose Place 1 drop into both eyes 3 (three) times daily as needed (dry eyes).   rifaximin 550 MG Tabs tablet Commonly known as: XIFAXAN Take 550 mg by mouth 2 (two) times daily.   spironolactone 100 MG tablet Commonly known as: ALDACTONE Take 50 mg by mouth every other day.   tamsulosin 0.4 MG Caps capsule Commonly known as: FLOMAX Take 0.4 mg by mouth 2 (two) times daily.   traZODone 150 MG tablet Commonly known as: DESYREL Take 75 mg by mouth at bedtime as needed for sleep.        Follow-up Information     [facility] Follow up.   Why: IR scheduler will call with appoinment date and time. Please call [phone] with any questions or concerns Contact information: [place] [id number] [phone]                  Electronically Signed: Jacqualine Mau, NP 03/27/2022, 9:12 AM   I have spent Less Than 30 Minutes discharging Corey Wood.

## 2022-03-27 NOTE — Progress Notes (Signed)
Corey Wood to be D/C'd  per MD order.  Discussed with the patient and all questions fully answered.  VSS, Skin clean, dry and intact without evidence of skin break down, no evidence of skin tears noted.  IV catheter discontinued intact. Site without signs and symptoms of complications. Dressing and pressure applied.  An After Visit Summary was printed and given to the patient. Patient received prescription.  D/c education completed with patient/family including follow up instructions, medication list, d/c activities limitations if indicated, with other d/c instructions as indicated by MD - patient able to verbalize understanding, all questions fully answered.   Patient instructed to return to ED, call 911, or call MD for any changes in condition.   Patient to be escorted via [place], and D/C home via private auto.

## 2022-03-27 NOTE — Plan of Care (Signed)
  Problem: Education: Goal: Knowledge of General Education information will improve Description: Including pain rating scale, medication(s)/side effects and non-pharmacologic comfort measures Outcome: Progressing   Problem: Activity: Goal: Risk for activity intolerance will decrease Outcome: Progressing   Problem: Nutrition: Goal: Adequate nutrition will be maintained Outcome: Progressing   Problem: Pain Managment: Goal: General experience of comfort will improve Outcome: Progressing   Problem: Elimination: Goal: Will not experience complications related to bowel motility Outcome: Progressing Goal: Will not experience complications related to urinary retention Outcome: Progressing   Problem: Safety: Goal: Ability to remain free from injury will improve Outcome: Progressing   Problem: Skin Integrity: Goal: Risk for impaired skin integrity will decrease Outcome: Progressing

## 2022-03-27 NOTE — Plan of Care (Signed)
  Problem: Education: Goal: Knowledge of General Education information will improve Description: Including pain rating scale, medication(s)/side effects and non-pharmacologic comfort measures Outcome: Completed/Met   Problem: Health Behavior/Discharge Planning: Goal: Ability to manage health-related needs will improve Outcome: Completed/Met   Problem: Clinical Measurements: Goal: Ability to maintain clinical measurements within normal limits will improve Outcome: Completed/Met Goal: Will remain free from infection Outcome: Completed/Met Goal: Diagnostic test results will improve Outcome: Completed/Met Goal: Respiratory complications will improve Outcome: Completed/Met Goal: Cardiovascular complication will be avoided Outcome: Completed/Met   Problem: Activity: Goal: Risk for activity intolerance will decrease Outcome: Completed/Met   Problem: Nutrition: Goal: Adequate nutrition will be maintained Outcome: Completed/Met   Problem: Coping: Goal: Level of anxiety will decrease Outcome: Completed/Met   Problem: Elimination: Goal: Will not experience complications related to bowel motility Outcome: Completed/Met Goal: Will not experience complications related to urinary retention Outcome: Completed/Met   Problem: Pain Managment: Goal: General experience of comfort will improve Outcome: Completed/Met   Problem: Safety: Goal: Ability to remain free from injury will improve Outcome: Completed/Met   Problem: Skin Integrity: Goal: Risk for impaired skin integrity will decrease Outcome: Completed/Met   Problem: Education: Goal: Understanding of CV disease, CV risk reduction, and recovery process will improve Outcome: Completed/Met Goal: Individualized Educational Video(s) Outcome: Completed/Met   Problem: Activity: Goal: Ability to return to baseline activity level will improve Outcome: Completed/Met   Problem: Cardiovascular: Goal: Ability to achieve and maintain  adequate cardiovascular perfusion will improve Outcome: Completed/Met Goal: Vascular access site(s) Level 0-1 will be maintained Outcome: Completed/Met   Problem: Health Behavior/Discharge Planning: Goal: Ability to safely manage health-related needs after discharge will improve Outcome: Completed/Met   Problem: Health Behavior/Discharge Planning: Goal: Ability to manage health-related needs will improve Outcome: Completed/Met   Problem: Clinical Measurements: Goal: Ability to maintain clinical measurements within normal limits will improve Outcome: Completed/Met Goal: Will remain free from infection Outcome: Completed/Met   Problem: Activity: Goal: Risk for activity intolerance will decrease Outcome: Completed/Met   Problem: Safety: Goal: Ability to remain free from injury will improve Outcome: Completed/Met   Problem: Skin Integrity: Goal: Risk for impaired skin integrity will decrease Outcome: Completed/Met

## 2022-03-29 NOTE — Anesthesia Postprocedure Evaluation (Signed)
Anesthesia Post Note  Patient: Corey Wood  Procedure(s) Performed: TIPS     Patient location during evaluation: PACU Anesthesia Type: General Level of consciousness: awake and alert Pain management: pain level controlled Vital Signs Assessment: post-procedure vital signs reviewed and stable Respiratory status: spontaneous breathing, nonlabored ventilation, respiratory function stable and patient connected to nasal cannula oxygen Cardiovascular status: blood pressure returned to baseline and stable Postop Assessment: no apparent nausea or vomiting Anesthetic complications: no  No notable events documented.  Last Vitals:  Vitals:   03/27/22 0407 03/27/22 0923  BP: 111/66 102/67  Pulse: (!) 59 (!) 53  Resp: 16 17  Temp: 36.5 C 36.8 C  SpO2: 98% 97%    Last Pain:  Vitals:   03/27/22 0856  TempSrc:   PainSc: 0-No pain                 Kreg Earhart S

## 2022-04-01 ENCOUNTER — Telehealth: Payer: Self-pay

## 2022-04-01 ENCOUNTER — Ambulatory Visit (INDEPENDENT_AMBULATORY_CARE_PROVIDER_SITE_OTHER): Payer: No Typology Code available for payment source | Admitting: Gastroenterology

## 2022-04-01 ENCOUNTER — Encounter (HOSPITAL_COMMUNITY): Payer: Self-pay

## 2022-04-01 ENCOUNTER — Encounter: Payer: Self-pay | Admitting: Gastroenterology

## 2022-04-01 ENCOUNTER — Emergency Department (HOSPITAL_COMMUNITY): Payer: Non-veteran care

## 2022-04-01 ENCOUNTER — Other Ambulatory Visit: Payer: Self-pay

## 2022-04-01 ENCOUNTER — Inpatient Hospital Stay (HOSPITAL_COMMUNITY)
Admission: EM | Admit: 2022-04-01 | Discharge: 2022-04-10 | DRG: 442 | Disposition: A | Discharge status: Home Health | Payer: Non-veteran care | Attending: Internal Medicine | Admitting: Internal Medicine

## 2022-04-01 VITALS — BP 124/58 | HR 74 | Ht 66.0 in

## 2022-04-01 DIAGNOSIS — R4182 Altered mental status, unspecified: Secondary | ICD-10-CM | POA: Diagnosis not present

## 2022-04-01 DIAGNOSIS — N1831 Chronic kidney disease, stage 3a: Secondary | ICD-10-CM | POA: Diagnosis present

## 2022-04-01 DIAGNOSIS — R140 Abdominal distension (gaseous): Secondary | ICD-10-CM | POA: Diagnosis not present

## 2022-04-01 DIAGNOSIS — J449 Chronic obstructive pulmonary disease, unspecified: Secondary | ICD-10-CM | POA: Diagnosis present

## 2022-04-01 DIAGNOSIS — Z794 Long term (current) use of insulin: Secondary | ICD-10-CM | POA: Diagnosis not present

## 2022-04-01 DIAGNOSIS — R29818 Other symptoms and signs involving the nervous system: Secondary | ICD-10-CM | POA: Diagnosis not present

## 2022-04-01 DIAGNOSIS — N1330 Unspecified hydronephrosis: Secondary | ICD-10-CM | POA: Diagnosis present

## 2022-04-01 DIAGNOSIS — K7460 Unspecified cirrhosis of liver: Secondary | ICD-10-CM | POA: Diagnosis present

## 2022-04-01 DIAGNOSIS — Z8673 Personal history of transient ischemic attack (TIA), and cerebral infarction without residual deficits: Secondary | ICD-10-CM

## 2022-04-01 DIAGNOSIS — I9589 Other hypotension: Secondary | ICD-10-CM | POA: Diagnosis present

## 2022-04-01 DIAGNOSIS — F32A Depression, unspecified: Secondary | ICD-10-CM | POA: Diagnosis present

## 2022-04-01 DIAGNOSIS — K5939 Other megacolon: Secondary | ICD-10-CM | POA: Diagnosis not present

## 2022-04-01 DIAGNOSIS — N189 Chronic kidney disease, unspecified: Secondary | ICD-10-CM

## 2022-04-01 DIAGNOSIS — N201 Calculus of ureter: Secondary | ICD-10-CM | POA: Diagnosis not present

## 2022-04-01 DIAGNOSIS — Z95828 Presence of other vascular implants and grafts: Secondary | ICD-10-CM

## 2022-04-01 DIAGNOSIS — D696 Thrombocytopenia, unspecified: Secondary | ICD-10-CM | POA: Diagnosis not present

## 2022-04-01 DIAGNOSIS — Z515 Encounter for palliative care: Secondary | ICD-10-CM | POA: Diagnosis not present

## 2022-04-01 DIAGNOSIS — I712 Thoracic aortic aneurysm, without rupture, unspecified: Secondary | ICD-10-CM | POA: Diagnosis not present

## 2022-04-01 DIAGNOSIS — E876 Hypokalemia: Secondary | ICD-10-CM | POA: Diagnosis not present

## 2022-04-01 DIAGNOSIS — E785 Hyperlipidemia, unspecified: Secondary | ICD-10-CM | POA: Diagnosis present

## 2022-04-01 DIAGNOSIS — I8510 Secondary esophageal varices without bleeding: Secondary | ICD-10-CM

## 2022-04-01 DIAGNOSIS — K567 Ileus, unspecified: Secondary | ICD-10-CM | POA: Diagnosis not present

## 2022-04-01 DIAGNOSIS — E669 Obesity, unspecified: Secondary | ICD-10-CM | POA: Diagnosis present

## 2022-04-01 DIAGNOSIS — K7290 Hepatic failure, unspecified without coma: Secondary | ICD-10-CM | POA: Diagnosis present

## 2022-04-01 DIAGNOSIS — Z79891 Long term (current) use of opiate analgesic: Secondary | ICD-10-CM

## 2022-04-01 DIAGNOSIS — Z7189 Other specified counseling: Secondary | ICD-10-CM | POA: Diagnosis not present

## 2022-04-01 DIAGNOSIS — K766 Portal hypertension: Secondary | ICD-10-CM | POA: Diagnosis present

## 2022-04-01 DIAGNOSIS — F419 Anxiety disorder, unspecified: Secondary | ICD-10-CM | POA: Diagnosis present

## 2022-04-01 DIAGNOSIS — R188 Other ascites: Secondary | ICD-10-CM

## 2022-04-01 DIAGNOSIS — G20A1 Parkinson's disease without dyskinesia, without mention of fluctuations: Secondary | ICD-10-CM | POA: Diagnosis not present

## 2022-04-01 DIAGNOSIS — E7220 Disorder of urea cycle metabolism, unspecified: Secondary | ICD-10-CM | POA: Diagnosis present

## 2022-04-01 DIAGNOSIS — J948 Other specified pleural conditions: Secondary | ICD-10-CM | POA: Diagnosis not present

## 2022-04-01 DIAGNOSIS — K7682 Hepatic encephalopathy: Secondary | ICD-10-CM | POA: Diagnosis present

## 2022-04-01 DIAGNOSIS — K6389 Other specified diseases of intestine: Secondary | ICD-10-CM | POA: Diagnosis not present

## 2022-04-01 DIAGNOSIS — E871 Hypo-osmolality and hyponatremia: Secondary | ICD-10-CM | POA: Diagnosis present

## 2022-04-01 DIAGNOSIS — Z79899 Other long term (current) drug therapy: Secondary | ICD-10-CM

## 2022-04-01 DIAGNOSIS — J302 Other seasonal allergic rhinitis: Secondary | ICD-10-CM | POA: Diagnosis present

## 2022-04-01 DIAGNOSIS — M1990 Unspecified osteoarthritis, unspecified site: Secondary | ICD-10-CM | POA: Diagnosis present

## 2022-04-01 DIAGNOSIS — R278 Other lack of coordination: Secondary | ICD-10-CM | POA: Diagnosis present

## 2022-04-01 DIAGNOSIS — J9811 Atelectasis: Secondary | ICD-10-CM | POA: Diagnosis present

## 2022-04-01 DIAGNOSIS — E119 Type 2 diabetes mellitus without complications: Secondary | ICD-10-CM

## 2022-04-01 DIAGNOSIS — E1122 Type 2 diabetes mellitus with diabetic chronic kidney disease: Secondary | ICD-10-CM | POA: Diagnosis present

## 2022-04-01 DIAGNOSIS — G9389 Other specified disorders of brain: Secondary | ICD-10-CM | POA: Diagnosis not present

## 2022-04-01 DIAGNOSIS — K7469 Other cirrhosis of liver: Secondary | ICD-10-CM | POA: Diagnosis present

## 2022-04-01 DIAGNOSIS — Z6824 Body mass index (BMI) 24.0-24.9, adult: Secondary | ICD-10-CM

## 2022-04-01 DIAGNOSIS — Z9049 Acquired absence of other specified parts of digestive tract: Secondary | ICD-10-CM

## 2022-04-01 DIAGNOSIS — Z8616 Personal history of COVID-19: Secondary | ICD-10-CM | POA: Diagnosis not present

## 2022-04-01 DIAGNOSIS — Z888 Allergy status to other drugs, medicaments and biological substances status: Secondary | ICD-10-CM

## 2022-04-01 DIAGNOSIS — Q600 Renal agenesis, unilateral: Secondary | ICD-10-CM

## 2022-04-01 DIAGNOSIS — K3189 Other diseases of stomach and duodenum: Secondary | ICD-10-CM | POA: Diagnosis not present

## 2022-04-01 DIAGNOSIS — D61818 Other pancytopenia: Secondary | ICD-10-CM | POA: Diagnosis present

## 2022-04-01 DIAGNOSIS — K7581 Nonalcoholic steatohepatitis (NASH): Secondary | ICD-10-CM | POA: Diagnosis not present

## 2022-04-01 DIAGNOSIS — Z66 Do not resuscitate: Secondary | ICD-10-CM | POA: Diagnosis present

## 2022-04-01 DIAGNOSIS — I129 Hypertensive chronic kidney disease with stage 1 through stage 4 chronic kidney disease, or unspecified chronic kidney disease: Secondary | ICD-10-CM | POA: Diagnosis present

## 2022-04-01 DIAGNOSIS — R627 Adult failure to thrive: Secondary | ICD-10-CM | POA: Diagnosis present

## 2022-04-01 DIAGNOSIS — D469 Myelodysplastic syndrome, unspecified: Secondary | ICD-10-CM | POA: Diagnosis present

## 2022-04-01 DIAGNOSIS — I864 Gastric varices: Secondary | ICD-10-CM | POA: Diagnosis present

## 2022-04-01 DIAGNOSIS — G238 Other specified degenerative diseases of basal ganglia: Secondary | ICD-10-CM | POA: Diagnosis not present

## 2022-04-01 DIAGNOSIS — N289 Disorder of kidney and ureter, unspecified: Secondary | ICD-10-CM | POA: Diagnosis not present

## 2022-04-01 DIAGNOSIS — I7121 Aneurysm of the ascending aorta, without rupture: Secondary | ICD-10-CM | POA: Diagnosis present

## 2022-04-01 DIAGNOSIS — K219 Gastro-esophageal reflux disease without esophagitis: Secondary | ICD-10-CM | POA: Diagnosis present

## 2022-04-01 DIAGNOSIS — J929 Pleural plaque without asbestos: Secondary | ICD-10-CM | POA: Diagnosis not present

## 2022-04-01 DIAGNOSIS — J90 Pleural effusion, not elsewhere classified: Secondary | ICD-10-CM | POA: Diagnosis present

## 2022-04-01 DIAGNOSIS — J918 Pleural effusion in other conditions classified elsewhere: Secondary | ICD-10-CM | POA: Diagnosis present

## 2022-04-01 DIAGNOSIS — G4733 Obstructive sleep apnea (adult) (pediatric): Secondary | ICD-10-CM | POA: Diagnosis present

## 2022-04-01 DIAGNOSIS — R410 Disorientation, unspecified: Secondary | ICD-10-CM | POA: Diagnosis not present

## 2022-04-01 DIAGNOSIS — R161 Splenomegaly, not elsewhere classified: Secondary | ICD-10-CM | POA: Diagnosis present

## 2022-04-01 LAB — CBC WITH DIFFERENTIAL/PLATELET
Abs Immature Granulocytes: 0.05 10*3/uL (ref 0.00–0.07)
Basophils Absolute: 0.1 10*3/uL (ref 0.0–0.1)
Basophils Relative: 1 %
Eosinophils Absolute: 0.2 10*3/uL (ref 0.0–0.5)
Eosinophils Relative: 4 %
HCT: 35.1 % — ABNORMAL LOW (ref 39.0–52.0)
Hemoglobin: 11.5 g/dL — ABNORMAL LOW (ref 13.0–17.0)
Immature Granulocytes: 1 %
Lymphocytes Relative: 15 %
Lymphs Abs: 0.7 10*3/uL (ref 0.7–4.0)
MCH: 31 pg (ref 26.0–34.0)
MCHC: 32.8 g/dL (ref 30.0–36.0)
MCV: 94.6 fL (ref 80.0–100.0)
Monocytes Absolute: 0.3 10*3/uL (ref 0.1–1.0)
Monocytes Relative: 6 %
Neutro Abs: 3.3 10*3/uL (ref 1.7–7.7)
Neutrophils Relative %: 73 %
Platelets: 105 10*3/uL — ABNORMAL LOW (ref 150–400)
RBC: 3.71 MIL/uL — ABNORMAL LOW (ref 4.22–5.81)
RDW: 16.9 % — ABNORMAL HIGH (ref 11.5–15.5)
WBC: 4.6 10*3/uL (ref 4.0–10.5)
nRBC: 0 % (ref 0.0–0.2)

## 2022-04-01 LAB — AMMONIA: Ammonia: 99 umol/L — ABNORMAL HIGH (ref 9–35)

## 2022-04-01 LAB — COMPREHENSIVE METABOLIC PANEL
ALT: 7 U/L (ref 0–44)
AST: 35 U/L (ref 15–41)
Albumin: 3.1 g/dL — ABNORMAL LOW (ref 3.5–5.0)
Alkaline Phosphatase: 129 U/L — ABNORMAL HIGH (ref 38–126)
Anion gap: 10 (ref 5–15)
BUN: 14 mg/dL (ref 8–23)
CO2: 27 mmol/L (ref 22–32)
Calcium: 9.4 mg/dL (ref 8.9–10.3)
Chloride: 100 mmol/L (ref 98–111)
Creatinine, Ser: 1.16 mg/dL (ref 0.61–1.24)
GFR, Estimated: >60 mL/min (ref >60)
Glucose, Bld: 149 mg/dL — ABNORMAL HIGH (ref 70–99)
Potassium: 3.7 mmol/L (ref 3.5–5.1)
Sodium: 137 mmol/L (ref 135–145)
Total Bilirubin: 1.3 mg/dL — ABNORMAL HIGH (ref 0.3–1.2)
Total Protein: 8.4 g/dL — ABNORMAL HIGH (ref 6.5–8.1)

## 2022-04-01 LAB — PROTIME-INR
INR: 1.2 (ref 0.8–1.2)
Prothrombin Time: 15.4 seconds — ABNORMAL HIGH (ref 11.4–15.2)

## 2022-04-01 LAB — LACTIC ACID, PLASMA
Lactic Acid, Venous: 1.7 mmol/L (ref 0.5–1.9)
Lactic Acid, Venous: 1.7 mmol/L (ref 0.5–1.9)

## 2022-04-01 LAB — LIPASE, BLOOD: Lipase: 35 U/L (ref 11–51)

## 2022-04-01 LAB — CBG MONITORING, ED
Glucose-Capillary: 151 mg/dL — ABNORMAL HIGH (ref 70–99)
Glucose-Capillary: 140 mg/dL — ABNORMAL HIGH (ref 70–99)

## 2022-04-01 MED ORDER — ONDANSETRON HCL 4 MG PO TABS: 4.0000 mg | ORAL_TABLET | Freq: Four times a day (QID) | ORAL | Status: DC | PRN

## 2022-04-01 MED ORDER — IOHEXOL 300 MG/ML  SOLN
80.0000 mL | Freq: Once | INTRAMUSCULAR | Status: AC | PRN
  Administered 2022-04-01: 80 mL via INTRAVENOUS

## 2022-04-01 MED ORDER — LACTULOSE 10 GM/15ML PO SOLN: 30.0000 g | Freq: Three times a day (TID) | ORAL | Status: DC

## 2022-04-01 MED ORDER — INSULIN ASPART 100 UNIT/ML IJ SOLN
0.0000 [IU] | Freq: Three times a day (TID) | INTRAMUSCULAR | Status: DC
  Administered 2022-04-02: 3 [IU] via SUBCUTANEOUS
  Administered 2022-04-02 (×2): 2 [IU] via SUBCUTANEOUS
  Administered 2022-04-03: 5 [IU] via SUBCUTANEOUS
  Administered 2022-04-03: 2 [IU] via SUBCUTANEOUS
  Administered 2022-04-03: 3 [IU] via SUBCUTANEOUS
  Administered 2022-04-04 (×3): 2 [IU] via SUBCUTANEOUS
  Administered 2022-04-05: 7 [IU] via SUBCUTANEOUS
  Administered 2022-04-05: 2 [IU] via SUBCUTANEOUS
  Administered 2022-04-05: 5 [IU] via SUBCUTANEOUS
  Administered 2022-04-06: 3 [IU] via SUBCUTANEOUS
  Administered 2022-04-06: 2 [IU] via SUBCUTANEOUS
  Administered 2022-04-06: 5 [IU] via SUBCUTANEOUS
  Administered 2022-04-07: 3 [IU] via SUBCUTANEOUS
  Administered 2022-04-07: 5 [IU] via SUBCUTANEOUS
  Administered 2022-04-07: 7 [IU] via SUBCUTANEOUS
  Administered 2022-04-08 (×2): 3 [IU] via SUBCUTANEOUS
  Administered 2022-04-08 – 2022-04-10 (×5): 2 [IU] via SUBCUTANEOUS
  Filled 2022-04-01: qty 0.09

## 2022-04-01 MED ORDER — CARBIDOPA-LEVODOPA 25-100 MG PO TABS
1.0000 | ORAL_TABLET | Freq: Three times a day (TID) | ORAL | Status: DC
  Administered 2022-04-01 – 2022-04-10 (×27): 1 via ORAL
  Filled 2022-04-01 (×27): qty 1

## 2022-04-01 MED ORDER — RIFAXIMIN 550 MG PO TABS
550.0000 mg | ORAL_TABLET | Freq: Once | ORAL | Status: DC
  Filled 2022-04-01: qty 1

## 2022-04-01 MED ORDER — RIFAXIMIN 550 MG PO TABS
550.0000 mg | ORAL_TABLET | Freq: Two times a day (BID) | ORAL | Status: DC
  Administered 2022-04-01 – 2022-04-10 (×18): 550 mg via ORAL
  Filled 2022-04-01 (×18): qty 1

## 2022-04-01 MED ORDER — ONDANSETRON HCL 4 MG/2ML IJ SOLN: 4.0000 mg | Freq: Four times a day (QID) | INTRAMUSCULAR | Status: DC | PRN

## 2022-04-01 MED ORDER — PANTOPRAZOLE SODIUM 40 MG PO TBEC
40.0000 mg | DELAYED_RELEASE_TABLET | Freq: Every day | ORAL | Status: DC
  Administered 2022-04-02 – 2022-04-10 (×9): 40 mg via ORAL
  Filled 2022-04-01 (×9): qty 1

## 2022-04-01 MED ORDER — SPIRONOLACTONE 25 MG PO TABS
50.0000 mg | ORAL_TABLET | ORAL | Status: DC
  Administered 2022-04-02 – 2022-04-06 (×3): 50 mg via ORAL
  Filled 2022-04-01 (×3): qty 2

## 2022-04-01 MED ORDER — TAMSULOSIN HCL 0.4 MG PO CAPS
0.4000 mg | ORAL_CAPSULE | Freq: Two times a day (BID) | ORAL | Status: DC
  Administered 2022-04-01 – 2022-04-10 (×18): 0.4 mg via ORAL
  Filled 2022-04-01 (×18): qty 1

## 2022-04-01 MED ORDER — MONTELUKAST SODIUM 10 MG PO TABS
10.0000 mg | ORAL_TABLET | Freq: Every day | ORAL | Status: DC
  Administered 2022-04-02 – 2022-04-10 (×9): 10 mg via ORAL
  Filled 2022-04-01 (×9): qty 1

## 2022-04-01 MED ORDER — LACTULOSE 10 GM/15ML PO SOLN
30.0000 g | Freq: Three times a day (TID) | ORAL | Status: DC
  Administered 2022-04-01 – 2022-04-02 (×4): 30 g via ORAL
  Filled 2022-04-01 (×3): qty 45
  Filled 2022-04-01: qty 60

## 2022-04-01 MED ORDER — FUROSEMIDE 20 MG PO TABS
20.0000 mg | ORAL_TABLET | Freq: Every day | ORAL | Status: DC
  Administered 2022-04-02: 20 mg via ORAL
  Filled 2022-04-01: qty 1

## 2022-04-01 MED ORDER — BUPROPION HCL ER (XL) 150 MG PO TB24
150.0000 mg | ORAL_TABLET | Freq: Every day | ORAL | Status: DC
  Administered 2022-04-02: 150 mg via ORAL
  Filled 2022-04-01: qty 1

## 2022-04-01 MED ORDER — SODIUM CHLORIDE (PF) 0.9 % IJ SOLN
INTRAMUSCULAR | Status: AC
  Filled 2022-04-01: qty 50

## 2022-04-01 MED ORDER — ALBUTEROL SULFATE (2.5 MG/3ML) 0.083% IN NEBU: 2.5000 mg | INHALATION_SOLUTION | Freq: Four times a day (QID) | RESPIRATORY_TRACT | Status: DC | PRN

## 2022-04-01 MED ORDER — MIDODRINE HCL 5 MG PO TABS
10.0000 mg | ORAL_TABLET | Freq: Three times a day (TID) | ORAL | Status: DC
  Administered 2022-04-02 – 2022-04-10 (×26): 10 mg via ORAL
  Filled 2022-04-01 (×22): qty 2
  Filled 2022-04-01: qty 1
  Filled 2022-04-01 (×7): qty 2

## 2022-04-01 MED ORDER — SODIUM CHLORIDE 0.9 % IV BOLUS: 1000.0000 mL | Freq: Once | INTRAVENOUS | Status: DC

## 2022-04-01 NOTE — ED Triage Notes (Signed)
Patient's wife reports that the patient had a stent placed in his liver last week. Patient's wife reports that the patient has had gradual confusion x 3 days. The wife reports that the patient was walking with a walker and is unable to stand at all. Patient's abdomen is "bloated."

## 2022-04-01 NOTE — Hospital Course (Addendum)
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   Of note, the patient recently underwent TIPS procedure on 03/26/2022.  Upon evaluation in the emergency department patient was found to have a markedly elevated ammonia level of 99.  Clinically the patient appears to be suffering from recurrent hepatic encephalopathy.  The hospitalist group was then called to assess the patient for admission to the hospital.  Patient was admitted to hospital service and placed back on an aggressive regimen of both oral and rectal lactulose in addition to rifaximin therapy.  Dr. Havery Moros with gastroenterology was additionally consulted.  Patient was extremely lethargic the first several days with concerns for overall guarded and even poor prognosis.  Palliative care team was additionally consulted.  Goals of care discussions with the wife at the bedside were undertaken with wife reluctant to pursue palliative measures or hospice discussion unless the patient was failing to improve.  In the days that followed patient did slowly clinically improve.  Patient became more awake and alert, was able to participate in eating and conversation near his baseline.  Physical therapy did evaluate the patient and recommended skilled physical therapy services in a skilled nursing facility.  After the wife had researched potential skilled nursing facilities in the surrounding area she was not satisfied with what was available and voiced her desire for the patient to be discharged home instead with home health services.  Patient continued to clinically improve until he plateaued.  Even at this point, it is  our medical opinion that the patient would best be served in a skilled nursing facility where the patient would receive 24-hour supervision considering his debilitated state.  The wife and patient both insisted that they wish to go home with home health services instead.  Patient has now been discharged home on 04/10/2022 in improved and stable condition.

## 2022-04-01 NOTE — Telephone Encounter (Signed)
Patient's granddaughter Althia Forts called into the office today. Patient is at [facility] - he has been triaged, labs have been drawn, and CT scan has been completed. They were advised that patient has to wait several hours and will likely not get admitted until tomorrow. They will not provide the patient with his home meds. I informed Althia Forts that it would be best for them to get Lactulose from home so that patient can continue taking 3-4 times a day. Althia Forts is aware that our inpatient service will come see him as soon as they can. Katelin verbalized understanding and had no concerns at the end of the call.

## 2022-04-01 NOTE — Progress Notes (Signed)
HPI :  72 year old male with a history of cirrhosis here for follow up.    See prior notes for details of his case. 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 Has been followed by hepatology with [place] in the past, followed also by multiple subspecialists at the [facility] for other medical problems. Course complicated by GI bleeding from large esophageal varices and hepatic hydrothorax.   He has had extremely large esophageal varices and GOV2 gastric varices, portal hypertensive gastritis.  Intolerant of beta-blockade due to bradycardia and hypotension, had elected for primary therapy of varices to be done with band ligation.  He has had a complicated course with post banding ulcer bleeding.  He has had more than 40 bands placed over the past 2 years.  His course has also been complicated by hepatic hydrothorax. He has 1 kidney with some CKD, on low-dose diuretics, intolerant to higher dosing diuretics due to hypotension and recurrent falls in the setting of Parkinson's.  He has been on midodrine now to help with his blood pressure and that has been helping him. .  We had been discussing TIPS, they had been thinking about it, unfortunately was readmitted in October with another variceal bleed.  He had an EGD with another 4 bands placed and discharged. I saw him on November 1 for hospital follow-up, we had a lengthy discussion about TIPS and they were agreeable to have this done and saw IR again about this.  He had TIPS done on December 29.  He was observed overnight, states it went well.   His wife is accompanying him today.  States "things are going downhill" since he got home.  He is very confused, very somnolent.  He has not been walking on his own and requires assistance to get out of bed and into the bathroom.  He has had another fall in recent days.  The wife is tearful and states she does not think she can care for him any longer at home.  He states he is not in any  pain.  He has been on lactulose twice daily and rifaximin as well since he has been at home.  He has not been eating well but no vomiting.  Despite taking the lactulose he is having 1 small hard stool per day, no loose stools or diarrhea.  No fevers.  States his breathing is okay, he satting 91% on room air currently in the office.  He has supplemental oxygen to use at home as needed for history of hepatic hydrothorax.  Patient currently thinks he is in the hospital when asked during his appointment today.  He is very pleasant and arousable but otherwise sitting in the chair with his eyes closed.  He has asterixis on exam.     Prior workup: EGD 01/30/20 - Esophagogastric landmarks identified. - Large esophageal varices as described. - Erythematous mucosa in the gastric fundus and antrum, suspect portal hypertensive gastritis. Biopsies taken to rule out H pylori. - Suspected small type 2 gastroesophageal varices (GOV2, esophageal varices which extend along the fundus). - Normal duodenal bulb and second portion of the duodenum, very mild superficial erythema Noted.   Colonoscopy 01/30/20 - The perianal and digital rectal examinations were normal. - The terminal ileum appeared normal. - A 5 to 6 mm polyp was found in the transverse colon. The polyp was flat. The polyp was removed with a cold snare. Resection and retrieval were complete. - Moderately congested mucosa was found  in the entire colon, suspected due to portal hypertension. - Internal hemorrhoids were found during retroflexion. - The exam was otherwise without abnormality.   1. Surgical [P], gastric antrum and gastric body - CHRONIC GASTRITIS. - WARTHIN-STARRY IS NEGATIVE FOR HELICOBACTER PYLORI. - NO INTESTINAL METAPLASIA, DYSPLASIA, OR MALIGNANCY. 2. Surgical [P], colon, transverse, polyp - TUBULAR ADENOMA. - NO HIGH GRADE DYSPLASIA OR MALIGNANCY.     EGD 04/17/20 - Esophagogastric landmarks identified. - Large esophageal  varices. Banded x 11. - Type 2 gastroesophageal varices (GOV2, esophageal varices which extend along the fundus). - Erythematous mucosa in the antrum. - Normal duodenal bulb and second portion of the duodenum.   EGD 04/25/20 - Large (> 5 mm) esophageal varices. - Esophageal ulcers with no stigmata of recent bleeding. - Portal hypertensive gastropathy. - A medium amount of food (residue) in the stomach. - Normal examined duodenum. - No specimens collected.   EGD 04/30/20 - No gross lesions in esophagus proximally. - Grade I, grade II and grade III esophageal varices as well as multiple post-banding ulcers with a few ulcers showing active oozing. Near completely eradicated after 6 bands were placed. No active extravasation noted thereafter. - Clotted blood in the entire stomach - suctioned and lavaged with mild clearance and did not see active re-accumulation of bleeding. - Type 2 gastroesophageal varices (GOV2, esophageal varices which extend along the fundus) - not completely visualized as had been at time of first endoscopy but no sing of active bleeding, but recent stigmata or nipple sign could have been missed due to blood in fundus. - Blood in the duodenal bulb and in the second portion of the duodenum - lavaged away.   Echo 05/01/20 - EF 60-65%   EGD 06/10/20 - Esophagogastric landmarks identified. - Large esophageal varices but improved overall in size / appearance compared to initial exam. Banded x 6 in the lower to mid esophagus. - Type 2 gastroesophageal varices (GOV2, esophageal varices which extend along the fundus). - Portal hypertensive gastropathy. - Normal duodenal bulb and second portion of the duodenum.   Patient had a CVA in April, follow up EGD was cancelled   Admitted to [facility] 5[facility]21[facility]08[facility]30/20 for volume overload   EGD 11/10/20 - Esophagogastric landmarks identified. - Esophageal varices as described above - improvement in size over time but columns  still persist, mostly medium in size, one large column. Banded x 6. - Portal hypertensive gastropathy. - Normal duodenal bulb and second portion of the duodenum.     EGD 02/10/21 - Grade II and small (< 5 mm) esophageal varices with no bleeding and no stigmata of recent bleeding. Banded x 3 today with good treatment response. Overall excellent response to EVL with now small varices. - Portal hypertensive gastropathy. No gastric varices seen. - Normal examined duodenum.   RUQ [place] 09/23/20 - IMPRESSION: Cirrhotic liver morphology.  No focal hepatic lesion.     EGD 03/31/21 - retained food in the stomach   EGD 04/21/21 - Dr. Ardis Hughs Three trunks of Grade II varices were found in the distal esophagus with scar evidence of previous banding procedures. Six bands were successfully placed with complete eradication, resulting in deflation of varices. There was no bleeding during the procedure. Mild portal gastropathy changes throughout the stomach. No gastric varices   RUQ 02/27/21: IMPRESSION: 1. Cirrhotic morphology liver.  No sonographic evidence of hepatoma. 2. Common bile duct dilation, likely post cholecystectomy distention.     EGD 10/15/21: Esophagogastric landmarks identified. - Grade I esophageal varices  with flattening - extensive scarring from prior banding, improved. No further banding performed today - Portal hypertensive gastropathy. - Normal duodenal bulb and second portion of the duodenum.     EGD 07/16/21: One column of grade II varices with no bleeding and no stigmata of recent bleeding were found in the distal esophagus,. They were 6 mm in largest diameter. No red wale signs were present. Scarring from prior treatment was visible. The varices appeared smaller than they were at prior exam. Two bands were successfully placed with complete eradication, resulting in deflation of varices. There was no bleeding during and at the end of the procedure.   The exam of the  esophagus was otherwise normal. Moderate portal hypertensive gastropathy was found in the entire examined stomach. No gastric varices. The exam of the stomach was otherwise normal. The duodenal bulb and second portion of the duodenum were normal.      EGD 01/18/22: Grade II and large (> 5 mm) esophageal varices. Banded. x 4 some bleeding after banding of smaller varyx with nipple sign - it stopped. - Gastritis. - Portal hypertensive gastropathy. - The examination was otherwise normal. - No specimens collected.    Echocardiogram 01/19/22: EF 55-60%     US abdomen 01/19/22: IMPRESSION: 1. Stable findings of cirrhosis.  No focal liver abnormality. 2. Splenomegaly, compatible with portal venous hypertension. 3. Small amount of loculated ascites left upper quadrant. 4. Incidental small bilateral pleural effusions.     Past Medical History:  Diagnosis Date   Allergy    seasonal allergies   Anxiety    on meds   Aortic valve disorder    Arthritis    generalized (fingers)(shoulders)   Asthma    uses inhaler   Cataract    bilateral -sx    Cirrhosis (HCC)    CKD (chronic kidney disease), stage III (HCC)    only has one kidney   COPD (chronic obstructive pulmonary disease) (HCC)    Depression    on meds   DM (diabetes mellitus) (HCC)    Type II   Dyspnea    GERD (gastroesophageal reflux disease)    on meds   Heart murmur    History of colon polyps    History of COVID-19 10/14/2020   History of kidney stones    Hx of acute pancreatitis 10/2019   Hyperlipidemia    on meds   Hypertension    on meds   Hypothyroidism    not on meds at this time   Myelodysplastic syndrome ([place])    Neuromuscular disorder ([place])    per pt   Obstructive sleep apnea    Oxygen deficiency    Parkinsonism    per pt report   Peripheral edema    bilateral legs   Peripheral positional vertigo    Sleep apnea    No CPAP   Stroke [facility])      Past Surgical History:  Procedure Laterality  Date   ANKLE SURGERY     CHOLECYSTECTOMY     ENDARTERECTOMY Left 04/29/2020   Procedure: CAROTID EXPLORATION removal of  central venous catheter;  Surgeon: Rosetta Posner, MD;  Location: [facility] OR;  Service: Vascular;  Laterality: Left;   ESOPHAGEAL BANDING N/A 04/17/2020   Procedure: ESOPHAGEAL BANDING;  Surgeon: Yetta Flock, MD;  Location: [facility] ENDOSCOPY;  Service: Gastroenterology;  Laterality: N/A;   ESOPHAGEAL BANDING  04/29/2020   Procedure: ESOPHAGEAL BANDING;  Surgeon: Rush Landmark Telford Nab., MD;  Location: [place];  Service: Gastroenterology;;  ESOPHAGEAL BANDING N/A 06/10/2020   Procedure: ESOPHAGEAL BANDING;  Surgeon: Yetta Flock, MD;  Location: [facility];  Service: Gastroenterology;  Laterality: N/A;   ESOPHAGEAL BANDING N/A 11/10/2020   Procedure: ESOPHAGEAL BANDING;  Surgeon: Yetta Flock, MD;  Location: [facility];  Service: Gastroenterology;  Laterality: N/A;   ESOPHAGEAL BANDING N/A 02/10/2021   Procedure: ESOPHAGEAL BANDING;  Surgeon: Jerene Bears, MD;  Location: [facility];  Service: Gastroenterology;  Laterality: N/A;   ESOPHAGEAL BANDING N/A 04/21/2021   Procedure: ESOPHAGEAL BANDING;  Surgeon: Milus Banister, MD;  Location: [facility];  Service: Endoscopy;  Laterality: N/A;   ESOPHAGEAL BANDING N/A 07/16/2021   Procedure: ESOPHAGEAL BANDING;  Surgeon: Ladene Artist, MD;  Location: [facility];  Service: Gastroenterology;  Laterality: N/A;   ESOPHAGEAL BANDING  01/18/2022   Procedure: ESOPHAGEAL BANDING;  Surgeon: Gatha Mayer, MD;  Location: [facility] ENDOSCOPY;  Service: Gastroenterology;;   ESOPHAGOGASTRODUODENOSCOPY N/A 04/25/2020   Procedure: ESOPHAGOGASTRODUODENOSCOPY (EGD);  Surgeon: Carol Ada, MD;  Location: [facility];  Service: Endoscopy;  Laterality: N/A;   ESOPHAGOGASTRODUODENOSCOPY (EGD) WITH PROPOFOL N/A 04/17/2020   Procedure: ESOPHAGOGASTRODUODENOSCOPY (EGD) WITH PROPOFOL;  Surgeon: Yetta Flock, MD;  Location: [facility]  END[facility];  Service: Gastroenterology;  Laterality: N/A;   ESOPHAGOGASTRODUODENOSCOPY (EGD) WITH PROPOFOL N/A 04/29/2020   Procedure: ESOPHAGOGASTRODUODENOSCOPY (EGD) WITH PROPOFOL;  Surgeon: Rush Landmark Telford Nab., MD;  Location: [place];  Service: Gastroenterology;  Laterality: N/A;   ESOPHAGOGASTRODUODENOSCOPY (EGD) WITH PROPOFOL N/A 06/10/2020   Procedure: ESOPHAGOGASTRODUODENOSCOPY (EGD) WITH PROPOFOL;  Surgeon: Yetta Flock, MD;  Location: [facility];  Service: Gastroenterology;  Laterality: N/A;   ESOPHAGOGASTRODUODENOSCOPY (EGD) WITH PROPOFOL N/A 11/10/2020   Procedure: ESOPHAGOGASTRODUODENOSCOPY (EGD) WITH PROPOFOL;  Surgeon: Yetta Flock, MD;  Location: [facility];  Service: Gastroenterology;  Laterality: N/A;   ESOPHAGOGASTRODUODENOSCOPY (EGD) WITH PROPOFOL N/A 02/10/2021   Procedure: ESOPHAGOGASTRODUODENOSCOPY (EGD) WITH PROPOFOL;  Surgeon: Jerene Bears, MD;  Location: [facility];  Service: Gastroenterology;  Laterality: N/A;   ESOPHAGOGASTRODUODENOSCOPY (EGD) WITH PROPOFOL N/A 03/31/2021   Procedure: ESOPHAGOGASTRODUODENOSCOPY (EGD) WITH PROPOFOL;  Surgeon: Yetta Flock, MD;  Location: [facility];  Service: Gastroenterology;  Laterality: N/A;   ESOPHAGOGASTRODUODENOSCOPY (EGD) WITH PROPOFOL N/A 04/21/2021   Procedure: ESOPHAGOGASTRODUODENOSCOPY (EGD) WITH PROPOFOL;  Surgeon: Milus Banister, MD;  Location: [facility];  Service: Endoscopy;  Laterality: N/A;   ESOPHAGOGASTRODUODENOSCOPY (EGD) WITH PROPOFOL N/A 07/16/2021   Procedure: ESOPHAGOGASTRODUODENOSCOPY (EGD) WITH PROPOFOL;  Surgeon: Ladene Artist, MD;  Location: [facility];  Service: Gastroenterology;  Laterality: N/A;   ESOPHAGOGASTRODUODENOSCOPY (EGD) WITH PROPOFOL N/A 10/15/2021   Procedure: ESOPHAGOGASTRODUODENOSCOPY (EGD) WITH PROPOFOL;  Surgeon: Yetta Flock, MD;  Location: [facility];  Service: Gastroenterology;  Laterality: N/A;   ESOPHAGOGASTRODUODENOSCOPY (EGD) WITH  PROPOFOL N/A 01/18/2022   Procedure: ESOPHAGOGASTRODUODENOSCOPY (EGD) WITH PROPOFOL;  Surgeon: Gatha Mayer, MD;  Location: [place];  Service: Gastroenterology;  Laterality: N/A;   EYE SURGERY     HERNIA REPAIR     IR INTRAVASCULAR ULTRASOUND NON CORONARY  03/26/2022   IR PARACENTESIS  09/18/2020   IR RADIOLOGIST EVAL & MGMT  10/22/2021   IR THORACENTESIS ASP PLEURAL SPACE W/IMG GUIDE  10/05/2021   IR TIPS  03/26/2022   IR US GUIDE VASC ACCESS RIGHT  03/26/2022   KIDNEY STONE SURGERY     RADIOLOGY WITH ANESTHESIA N/A 03/26/2022   Procedure: TIPS;  Surgeon: Greggory Keen, MD;  Location: [place];  Service: Radiology;  Laterality: N/A;   WISDOM TOOTH EXTRACTION     Family  History  Problem Relation Age of Onset   Liver cancer Mother    Colon cancer Neg Hx    Stomach cancer Neg Hx    Colon polyps Neg Hx    Esophageal cancer Neg Hx    Rectal cancer Neg Hx    Social History   Tobacco Use   Smoking status: Never    Passive exposure: Never   Smokeless tobacco: Never  Vaping Use   Vaping Use: Never used  Substance Use Topics   Alcohol use: Yes    Alcohol/week: 1.0 standard drink of alcohol    Types: 1 Cans of beer per week    Comment: rarely   Drug use: Never   Current Outpatient Medications  Medication Sig Dispense Refill   acetaminophen (TYLENOL) 325 MG tablet Take 325 mg by mouth every 6 (six) hours as needed for moderate pain.     albuterol (VENTOLIN HFA) 108 (90 Base) MCG/ACT inhaler Inhale 1-2 puffs into the lungs every 6 (six) hours as needed for wheezing or shortness of breath.     ARTIFICIAL SALIVA MT Use as directed 1 spray in the mouth or throat as needed (Dry mouth). MOUTH COAT     buPROPion (WELLBUTRIN XL) 150 MG 24 hr tablet Take 150 mg by mouth daily.     carbidopa-levodopa (SINEMET IR) 25-100 MG tablet Take 1 tablet by mouth in the morning, at noon, and at bedtime.     fluticasone (FLONASE) 50 MCG/ACT nasal spray Place 1 spray into both nostrils daily as needed  for allergies.     furosemide (LASIX) 20 MG tablet Take 20 mg by mouth daily.     guaiFENesin (DIABETIC TUSSIN EX) 100 MG/5ML liquid Take 10 mLs by mouth every 4 (four) hours as needed for cough or to loosen phlegm.     insulin glargine (LANTUS) 100 unit/mL SOPN Inject 16 Units into the skin daily.     ipratropium (ATROVENT) 0.03 % nasal spray Place 1 spray into both nostrils at bedtime.     latanoprost (XALATAN) 0.005 % ophthalmic solution Place 1 drop into both eyes at bedtime.     lidocaine (LIDODERM) 5 % Place 1 patch onto the skin daily as needed (pain). Remove & Discard patch within 12 hours or as directed by MD     loratadine (CLARITIN) 10 MG tablet Take 10 mg by mouth daily as needed for allergies.     meclizine (ANTIVERT) 25 MG tablet Take 25 mg by mouth 2 (two) times daily as needed for dizziness.     midodrine (PROAMATINE) 10 MG tablet Take 1 tablet (10 mg total) by mouth 3 (three) times daily.     montelukast (SINGULAIR) 10 MG tablet Take 10 mg by mouth daily.     Multiple Vitamin (MULTIVITAMIN WITH MINERALS) TABS tablet Take 1 tablet by mouth daily.     pantoprazole (PROTONIX) 40 MG tablet Take 40 mg by mouth daily with breakfast.     rifaximin (XIFAXAN) 550 MG TABS tablet Take 550 mg by mouth 2 (two) times daily.     spironolactone (ALDACTONE) 100 MG tablet Take 50 mg by mouth every other day.     tamsulosin (FLOMAX) 0.4 MG CAPS capsule Take 0.4 mg by mouth 2 (two) times daily.     traZODone (DESYREL) 150 MG tablet Take 75 mg by mouth at bedtime as needed for sleep.     carboxymethylcellulose (REFRESH TEARS) 0.5 % SOLN Place 1 drop into both eyes 3 (three) times daily as needed (  dry eyes).     guaiFENesin-codeine (ROBITUSSIN AC) 100-10 MG/5ML syrup Take 2.5 mLs by mouth at bedtime.     lactulose (CHRONULAC) 10 GM/15ML solution Take 15 mLs (10 g total) by mouth 3 (three) times daily as needed for mild constipation. (Patient not taking: Reported on 03/24/2022) 1350 mL 2   No current  facility-administered medications for this visit.   Allergies  Allergen Reactions   Lisinopril Cough     Review of Systems: All systems reviewed and negative except where noted in HPI.    IR Tips  Result Date: 03/26/2022 CLINICAL DATA:  [name] cirrhosis, episodes of bleeding esophageal varices. Most recent bleed October 2023. EXAM: Ultrasound guidance for vascular access Intravascular ultrasound with the ICE catheter (puncture guidance) Transjugular intrahepatic portosystemic shunt creation (TIPS) MEDICATIONS: As antibiotic prophylaxis, 2 g Rocephin was ordered pre-procedure and administered intravenously within one hour of incision. ANESTHESIA/SEDATION: General - as administered by the Anesthesia department CONTRAST:  75 cc omni 300 FLUOROSCOPY: Radiation Exposure Index (as provided by the fluoroscopic device): 024 mGy Kerma COMPLICATIONS: None immediate. PROCEDURE: Informed written consent was obtained from the patient after a thorough discussion of the procedural risks, benefits and alternatives. All questions were addressed. Maximal Sterile Barrier Technique was utilized including caps, mask, sterile gowns, sterile gloves, sterile drape, hand hygiene and skin antiseptic. A timeout was performed prior to the initiation of the procedure. Under sterile conditions and general anesthesia, ultrasound micropuncture access performed the right common femoral vein. Images obtained for documentation of the patent right common femoral vein. Eight French sheath inserted over a Bentson guidewire. Through this access, the 8 [place] ICE catheter was advanced under fluoroscopic guidance to the level of the intrahepatic IVC junction for intravascular guidance of the TIPS creation. Also under sterile conditions and general anesthesia, ultrasound micropuncture access performed of the right internal jugular vein. Images obtained for documentation of the patent right internal jugular vein. Guidewire advanced  followed by the Accustick dilator set. Guidewire advanced into the IVC. Tract dilatation performed to insert the 10 French TIPS sheath. Through the sheath, the 5 [place] MPA catheter initially selected the posterior right hepatic vein. Right hepatic venogram performed. Right hepatic vein is widely patent. Catheter was retracted and directed anteriorly. Eventually, the more anterior middle hepatic vein was catheterized. Middle hepatic vein position confirmed by venography and intravascular ultrasound. Using ICE ultrasound visualization, the middle hepatic vein catheterization and portal vein anatomy was defined. A puncture window was visualized. Under direct intravascular ultrasound visualization the scorpion X needle was advanced into the central right portal vein. Gentle injection confirms portal vein position. Needle was visualized within the portal vein by intravascular ultrasound. Bentson guidewire advanced easily followed by 5 [place] catheter. Contrast injection again confirms portal vein position. Initial portogram performed. Portal vein is widely patent. Initial portal pressure was 32 mm Hg. Retrograde filling of esophageal varices visualized. 6 mm mustang balloon advanced through the percutaneous liver tract. Balloon dilatation performed of the liver tract without difficulty. Ten French sheath was advanced over the balloon while deflating. This allowed advancement of the 10 French sheath into the portal vein. Five [place] marker pigtail advanced. Simultaneous hepatic and portal venography performed for accurate measurement of the necessary length. For TIPS creation, the 8-10 mm x 8 cm x 2 cm Viatorr stent was successfully deployed. Initial dilatation of the newly created TIPS was to 8 mm with an 8 x 4 mustang balloon. Following tips creation, portosystemic gradient was 5 mmHg (portal pressure reduced to  27 mm Hg and right atrial pressure 22 mm Hg). Post TIPS portogram confirms antegrade portal flow and widely  patent TIPS. Varices are decompressed and no longer visualized. All catheters and sheath removed. Hemostasis obtained with manual compression of the right IJ and right common femoral venous access sites. Patient was transferred to the PACU in stable condition for inpatient recovery overnight. IMPRESSION: 1. Successful intrahepatic portosystemic shunt creation (TIPS), as above. Patient will be admitted to the hospitalist service for overnight observation. Outpatient follow-up with a TIPS ultrasound in 3 month. Electronically Signed   By: Jerilynn Mages.  Shick M.D.   On: 03/26/2022 15:14   IR US Guide Vasc Access Right  Result Date: 03/26/2022 CLINICAL DATA:  [name] cirrhosis, episodes of bleeding esophageal varices. Most recent bleed October 2023. EXAM: Ultrasound guidance for vascular access Intravascular ultrasound with the ICE catheter (puncture guidance) Transjugular intrahepatic portosystemic shunt creation (TIPS) MEDICATIONS: As antibiotic prophylaxis, 2 g Rocephin was ordered pre-procedure and administered intravenously within one hour of incision. ANESTHESIA/SEDATION: General - as administered by the Anesthesia department CONTRAST:  75 cc omni 300 FLUOROSCOPY: Radiation Exposure Index (as provided by the fluoroscopic device): 025 mGy Kerma COMPLICATIONS: None immediate. PROCEDURE: Informed written consent was obtained from the patient after a thorough discussion of the procedural risks, benefits and alternatives. All questions were addressed. Maximal Sterile Barrier Technique was utilized including caps, mask, sterile gowns, sterile gloves, sterile drape, hand hygiene and skin antiseptic. A timeout was performed prior to the initiation of the procedure. Under sterile conditions and general anesthesia, ultrasound micropuncture access performed the right common femoral vein. Images obtained for documentation of the patent right common femoral vein. Eight French sheath inserted over a Bentson guidewire. Through this  access, the 8 [place] ICE catheter was advanced under fluoroscopic guidance to the level of the intrahepatic IVC junction for intravascular guidance of the TIPS creation. Also under sterile conditions and general anesthesia, ultrasound micropuncture access performed of the right internal jugular vein. Images obtained for documentation of the patent right internal jugular vein. Guidewire advanced followed by the Accustick dilator set. Guidewire advanced into the IVC. Tract dilatation performed to insert the 10 French TIPS sheath. Through the sheath, the 5 [place] MPA catheter initially selected the posterior right hepatic vein. Right hepatic venogram performed. Right hepatic vein is widely patent. Catheter was retracted and directed anteriorly. Eventually, the more anterior middle hepatic vein was catheterized. Middle hepatic vein position confirmed by venography and intravascular ultrasound. Using ICE ultrasound visualization, the middle hepatic vein catheterization and portal vein anatomy was defined. A puncture window was visualized. Under direct intravascular ultrasound visualization the scorpion X needle was advanced into the central right portal vein. Gentle injection confirms portal vein position. Needle was visualized within the portal vein by intravascular ultrasound. Bentson guidewire advanced easily followed by 5 [place] catheter. Contrast injection again confirms portal vein position. Initial portogram performed. Portal vein is widely patent. Initial portal pressure was 32 mm Hg. Retrograde filling of esophageal varices visualized. 6 mm mustang balloon advanced through the percutaneous liver tract. Balloon dilatation performed of the liver tract without difficulty. Ten French sheath was advanced over the balloon while deflating. This allowed advancement of the 10 French sheath into the portal vein. Five [place] marker pigtail advanced. Simultaneous hepatic and portal venography performed for accurate  measurement of the necessary length. For TIPS creation, the 8-10 mm x 8 cm x 2 cm Viatorr stent was successfully deployed. Initial dilatation of the newly created TIPS was to 8  mm with an 8 x 4 mustang balloon. Following tips creation, portosystemic gradient was 5 mmHg (portal pressure reduced to 27 mm Hg and right atrial pressure 22 mm Hg). Post TIPS portogram confirms antegrade portal flow and widely patent TIPS. Varices are decompressed and no longer visualized. All catheters and sheath removed. Hemostasis obtained with manual compression of the right IJ and right common femoral venous access sites. Patient was transferred to the PACU in stable condition for inpatient recovery overnight. IMPRESSION: 1. Successful intrahepatic portosystemic shunt creation (TIPS), as above. Patient will be admitted to the hospitalist service for overnight observation. Outpatient follow-up with a TIPS ultrasound in 3 month. Electronically Signed   By: Jerilynn Mages.  Shick M.D.   On: 03/26/2022 15:14   IR INTRAVASCULAR ULTRASOUND NON CORONARY  Result Date: 03/26/2022 CLINICAL DATA:  [name] cirrhosis, episodes of bleeding esophageal varices. Most recent bleed October 2023. EXAM: Ultrasound guidance for vascular access Intravascular ultrasound with the ICE catheter (puncture guidance) Transjugular intrahepatic portosystemic shunt creation (TIPS) MEDICATIONS: As antibiotic prophylaxis, 2 g Rocephin was ordered pre-procedure and administered intravenously within one hour of incision. ANESTHESIA/SEDATION: General - as administered by the Anesthesia department CONTRAST:  75 cc omni 300 FLUOROSCOPY: Radiation Exposure Index (as provided by the fluoroscopic device): 295 mGy Kerma COMPLICATIONS: None immediate. PROCEDURE: Informed written consent was obtained from the patient after a thorough discussion of the procedural risks, benefits and alternatives. All questions were addressed. Maximal Sterile Barrier Technique was utilized including caps,  mask, sterile gowns, sterile gloves, sterile drape, hand hygiene and skin antiseptic. A timeout was performed prior to the initiation of the procedure. Under sterile conditions and general anesthesia, ultrasound micropuncture access performed the right common femoral vein. Images obtained for documentation of the patent right common femoral vein. Eight French sheath inserted over a Bentson guidewire. Through this access, the 8 [place] ICE catheter was advanced under fluoroscopic guidance to the level of the intrahepatic IVC junction for intravascular guidance of the TIPS creation. Also under sterile conditions and general anesthesia, ultrasound micropuncture access performed of the right internal jugular vein. Images obtained for documentation of the patent right internal jugular vein. Guidewire advanced followed by the Accustick dilator set. Guidewire advanced into the IVC. Tract dilatation performed to insert the 10 French TIPS sheath. Through the sheath, the 5 [place] MPA catheter initially selected the posterior right hepatic vein. Right hepatic venogram performed. Right hepatic vein is widely patent. Catheter was retracted and directed anteriorly. Eventually, the more anterior middle hepatic vein was catheterized. Middle hepatic vein position confirmed by venography and intravascular ultrasound. Using ICE ultrasound visualization, the middle hepatic vein catheterization and portal vein anatomy was defined. A puncture window was visualized. Under direct intravascular ultrasound visualization the scorpion X needle was advanced into the central right portal vein. Gentle injection confirms portal vein position. Needle was visualized within the portal vein by intravascular ultrasound. Bentson guidewire advanced easily followed by 5 [place] catheter. Contrast injection again confirms portal vein position. Initial portogram performed. Portal vein is widely patent. Initial portal pressure was 32 mm Hg. Retrograde  filling of esophageal varices visualized. 6 mm mustang balloon advanced through the percutaneous liver tract. Balloon dilatation performed of the liver tract without difficulty. Ten French sheath was advanced over the balloon while deflating. This allowed advancement of the 10 French sheath into the portal vein. Five [place] marker pigtail advanced. Simultaneous hepatic and portal venography performed for accurate measurement of the necessary length. For TIPS creation, the 8-10 mm x 8 cm  x 2 cm Viatorr stent was successfully deployed. Initial dilatation of the newly created TIPS was to 8 mm with an 8 x 4 mustang balloon. Following tips creation, portosystemic gradient was 5 mmHg (portal pressure reduced to 27 mm Hg and right atrial pressure 22 mm Hg). Post TIPS portogram confirms antegrade portal flow and widely patent TIPS. Varices are decompressed and no longer visualized. All catheters and sheath removed. Hemostasis obtained with manual compression of the right IJ and right common femoral venous access sites. Patient was transferred to the PACU in stable condition for inpatient recovery overnight. IMPRESSION: 1. Successful intrahepatic portosystemic shunt creation (TIPS), as above. Patient will be admitted to the hospitalist service for overnight observation. Outpatient follow-up with a TIPS ultrasound in 3 month. Electronically Signed   By: Jerilynn Mages.  Shick M.D.   On: 03/26/2022 15:14   US Abdomen Limited RUQ (LIVER/GB)  Result Date: 03/04/2022 CLINICAL DATA:  Cirrhosis.  Prior cholecystectomy EXAM: ULTRASOUND ABDOMEN LIMITED RIGHT UPPER QUADRANT COMPARISON:  CT abdomen pelvis February 09, 2022 FINDINGS: Gallbladder: Surgically absent Common bile duct: Diameter: 5 mm Liver: Coarsened echogenicity and nodular contour. No focal lesion. Portal vein is patent on color Doppler imaging with normal direction of blood flow towards the liver. Other: Trace perihepatic fluid.  Right pleural effusion. IMPRESSION: 1. Morphologic  changes compatible with cirrhosis. No focal lesion. 2. Trace perihepatic fluid. 3. Right pleural effusion. Electronically Signed   By: Lovey Newcomer M.D.   On: 03/04/2022 11:54    Physical Exam: BP (!) 124/58   Pulse 74   Ht '5\' 6"'$  (1.676 m)   BMI 25.76 kg/m  Constitutional: Pleasant,male in no acute distress but somnolent, sitting in wheelchair with eyes closed Cardiovascular: Normal rate, regular rhythm.  Pulmonary/chest: Effort normal. Decreased BS at bases. No wheezing, Abdominal: Soft, nontender.  Neurological: Alert and oriented to person, not to place. Confused with some answers and delayed response. (+) asterixis  ASSESSMENT: 72 y.o. male here for assessment of the following  1. Encephalopathy, hepatic ([place])   2. Cirrhosis of liver with ascites, unspecified hepatic cirrhosis type ([place])   3. Esophageal varices in cirrhosis ([place])   4. 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  He was not able to use beta-blockade due to hypotension and falls.  He was not able to use diuretics due to these issues as well, on midocrine to help with hypotension.  Had recurrent variceal bleeding.  Eventually he and wife were agreeable to have a TIPS which was placed on 12/29 per IR.  Sounds like the procedure went well itself, however since he got home he has been doing poorly.  Based on symptoms as outlined above, suspect has had worsening hepatic encephalopathy in the setting of recent TIPS.  He is encephalopathic on exam in the office today, he is not able to walk on his own, had another fall at home.  Wife is very tearful and cannot manage him in his current state right now.  He is not having enough bowel movements despite lactulose twice daily and needs escalation of dosing.  Discussed the situation with the patient and his wife.  Given his significant fall risk and recent falls, current mental status and inability to ambulate, he unfortunately needs to  be hospitalized to get his encephalopathy under control and make sure there is nothing else going on that could be precipitating this although I suspect this is more than likely related to TIPS.  He was taken  to the [facility] emergency room we will he will be triaged and have labs done.  I have discussed his case with our inpatient service, who will relay my recommendations to the emergency room providers (I called them numerous times today and could not get a hold of the ED physician).  Patient's wife is very thankful for assisting with his care at this time.  Hopefully his encephalopathy improves with therapy and it is a short hospital stay.  I do think TIPS, long-term, will help keep him out of the hospital and prevent recurrent bleeding and improve his respiratory status with better control of the hepatic hydrothorax.   PLAN: - recommended patient be taken to the [facility] ED for admission and evaluation - needs labs - CBC, CMET, INR, ammonia, make sure no other process going on that could be contributing to his encephalopathy, although very likely secondary to TIPS - increase lactulose to 3 or 4 times daily, goal 3 BMs per day. Can give lactulose enemas if needed - continue Rifaximin - inpatient service will assist in his care while hospitalized, discussed with them - patient will follow up with me following hospitalization  Jolly Mango, MD [facility]

## 2022-04-01 NOTE — Assessment & Plan Note (Addendum)
Minimal change compared to yesterday, it seems that patient's neurologic improvement has somewhat plateaued. prognosis continues to be guarded Continue lactulose and rifaximin  Ammonia level has now normalized there is no clinical evidence of infection or gastrointestinal bleeding We will continue to treat and monitor closely during this hospitalization to help determine whether patient will be more appropriate for disposition home with home health services versus a skilled nursing facility.

## 2022-04-01 NOTE — Assessment & Plan Note (Addendum)
Known history of Corey Wood cirrhosis complicated by gastric varices, recurrent gastrointestinal bleeding and chronic hepatic hydrothorax  Patient is status post TIPS procedure 03/26/2022  Resuming a low-dose regimen of spironolactone and Lasix. Continuing midodrine Gastroenterology following, their input is appreciated

## 2022-04-01 NOTE — Assessment & Plan Note (Addendum)
Chronic thrombocytopenia secondary to portal hypertension.   No clinical evidence of bleeding  Monitoring platelet counts with serial CBCs

## 2022-04-01 NOTE — Assessment & Plan Note (Addendum)
Patient still glycemic control is above target. Increasing Semglee to 10 units daily Accu-Cheks before every meal and nightly with sliding scale insulin Hemoglobin A1c 6.6% 01/18/2022

## 2022-04-01 NOTE — Assessment & Plan Note (Addendum)
   No evidence of COPD exacerbation this time  As needed bronchodilator therapy for episodic shortness of breath and wheezing.  

## 2022-04-01 NOTE — ED Provider Triage Note (Signed)
Emergency Medicine Provider Triage Evaluation Note  Corey Wood , a 72 y.o. male  was evaluated in triage.  Patient's wife reports that the patient had been having increased confusion in the last 3 days.  She reports patient had a stent placed in his liver last week.  History of liver cirrhosis.  Last bowel movement was 2 days ago.  Patient is taking Rifamixin postsurgery.  No fever, nausea, vomiting, chest pain, shortness of breath.  Review of Systems  Positive: As above Negative: As above  Physical Exam  BP 111/81 (BP Location: Right Arm)   Pulse 79   Temp 98.4 F (36.9 C) (Oral)   Resp 16   Ht '5\' 6"'$  (1.676 m)   SpO2 99%   BMI 25.76 kg/m  Gen:   Awake, no distress   Resp:  Normal effort  MSK:   Moves extremities without difficulty  Other:  Neuroexam with no focal neurological deficits however patient is slow to respond to commands.  Medical Decision Making  Medically screening exam initiated at 11:49 AM.  Appropriate orders placed.  AMEER Wood was informed that the remainder of the evaluation will be completed by another provider, this initial triage assessment does not replace that evaluation, and the importance of remaining in the ED until their evaluation is complete.     Rex Kras, [place] 04/01/22 [phone]

## 2022-04-01 NOTE — Assessment & Plan Note (Addendum)
Continue Sinemet ?

## 2022-04-01 NOTE — H&P (Signed)
History and Physical    Corey Wood GPQ:[id number] DOB: [birth year] DOA: 04/01/2022  PCP: [facility]  Patient coming from: Home  I have personally briefly reviewed patient's old medical records in [place]  Chief Complaint: Encephalopathy  HPI: Corey Wood is a 72 y.o. male with medical history significant for cryptogenic cirrhosis thought secondary to NASH associated with portal hypertension, esophageal and gastric varices, recurrent ascites s/p TIPS (03/26/2022), hepatic hydrothorax, CKD stage IIIa, Parkinson's disease, chronic thrombocytopenia, insulin-dependent T2DM who presented to the ED for evaluation of encephalopathy.  History is limited from patient due to encephalopathy and is otherwise supplemented by EDP, chart review, and spouse at bedside.  Patient recently underwent TIPS 03/26/2022.  Spouse says that prior to that he had been having good bowel movements up to 5 times per day with use of lactulose and rifaximin.  Since his procedure he has had significantly decreased small-volume bowel movements consisting of hard stool occurring maybe once per day.  He has been having increasing confusion since then and has been difficult to care for at home.  He has been requiring assistance to get out of bed and into the bathroom.  Oral intake has been down.  He had follow-up with his GI specialist Dr. Havery Moros in clinic today who advised that he present to the ED for management of likely hepatic encephalopathy.  ED Course  Labs/Imaging on admission: I have personally reviewed following labs and imaging studies.  Initial vitals showed BP 111/81, pulse 79, RR 16, temp 98.4 F, SpO2 99% on room air.  Labs show AST 35, ALT 7, alk phos 129, total bilirubin 1.3, ammonia 99, sodium 137, potassium 3.7, bicarb 27, BUN 14, creatinine 1.16, serum glucose 149, WBC 4.6, hemoglobin 11.5, platelets 105,000, lipase 35, lactic acid 1.7.  CT head without contrast negative for  acute intracranial process.  CT chest/abdomen/pelvis with contrast showed interval TIPS creation which appears patent.  Similar moderate left pleural effusion and decrease small right pleural effusion with persistent near complete collapse of bilateral lower lobes noted.  Chronic left hydroureteronephrosis, ascending aortic aneurysm measuring 4.1 cm, unchanged splenomegaly also seen.  No acute abnormality in the abdomen or pelvis.  Patient was ordered to receive rifaximin and lactulose.  The hospitalist service was consulted to admit for further evaluation and management.  Review of Systems:  All systems reviewed and are negative except as documented in history of present illness above.   Past Medical History:  Diagnosis Date   Allergy    seasonal allergies   Anxiety    on meds   Aortic valve disorder    Arthritis    generalized (fingers)(shoulders)   Asthma    uses inhaler   Cataract    bilateral -sx    Cirrhosis (HCC)    CKD (chronic kidney disease), stage III (HCC)    only has one kidney   COPD (chronic obstructive pulmonary disease) (HCC)    Depression    on meds   DM (diabetes mellitus) (HCC)    Type II   Dyspnea    GERD (gastroesophageal reflux disease)    on meds   Heart murmur    History of colon polyps    History of COVID-19 10/14/2020   History of kidney stones    Hx of acute pancreatitis 10/2019   Hyperlipidemia    on meds   Hypertension    on meds   Hypothyroidism    not on meds at this time  Myelodysplastic syndrome (HCC)    Neuromuscular disorder ([place])    per pt   Obstructive sleep apnea    Oxygen deficiency    Parkinsonism    per pt report   Peripheral edema    bilateral legs   Peripheral positional vertigo    Sleep apnea    No CPAP   Stroke [facility])     Past Surgical History:  Procedure Laterality Date   ANKLE SURGERY     CHOLECYSTECTOMY     ENDARTERECTOMY Left 04/29/2020   Procedure: CAROTID EXPLORATION removal of  central venous catheter;   Surgeon: Rosetta Posner, MD;  Location: [place];  Service: Vascular;  Laterality: Left;   ESOPHAGEAL BANDING N/A 04/17/2020   Procedure: ESOPHAGEAL BANDING;  Surgeon: Yetta Flock, MD;  Location: [facility];  Service: Gastroenterology;  Laterality: N/A;   ESOPHAGEAL BANDING  04/29/2020   Procedure: ESOPHAGEAL BANDING;  Surgeon: Rush Landmark Telford Nab., MD;  Location: [place];  Service: Gastroenterology;;   ESOPHAGEAL BANDING N/A 06/10/2020   Procedure: ESOPHAGEAL BANDING;  Surgeon: Yetta Flock, MD;  Location: [facility];  Service: Gastroenterology;  Laterality: N/A;   ESOPHAGEAL BANDING N/A 11/10/2020   Procedure: ESOPHAGEAL BANDING;  Surgeon: Yetta Flock, MD;  Location: [facility];  Service: Gastroenterology;  Laterality: N/A;   ESOPHAGEAL BANDING N/A 02/10/2021   Procedure: ESOPHAGEAL BANDING;  Surgeon: Jerene Bears, MD;  Location: [facility];  Service: Gastroenterology;  Laterality: N/A;   ESOPHAGEAL BANDING N/A 04/21/2021   Procedure: ESOPHAGEAL BANDING;  Surgeon: Milus Banister, MD;  Location: [facility];  Service: Endoscopy;  Laterality: N/A;   ESOPHAGEAL BANDING N/A 07/16/2021   Procedure: ESOPHAGEAL BANDING;  Surgeon: Ladene Artist, MD;  Location: [facility];  Service: Gastroenterology;  Laterality: N/A;   ESOPHAGEAL BANDING  01/18/2022   Procedure: ESOPHAGEAL BANDING;  Surgeon: Gatha Mayer, MD;  [facility];  Service: Gastroenterology;;   ESOPHAGOGASTRODUODENOSCOPY N/A 04/25/2020   Procedure: ESOPHAGOGASTRODUODENOSCOPY (EGD);  Surgeon: Carol Ada, MD;  Location: [facility];  Service: Endoscopy;  Laterality: N/A;   ESOPHAGOGASTRODUODENOSCOPY (EGD) WITH PROPOFOL N/A 04/17/2020   Procedure: ESOPHAGOGASTRODUODENOSCOPY (EGD) WITH PROPOFOL;  Surgeon: Yetta Flock, MD;  [facility];  Service: Gastroenterology;  Laterality: N/A;   ESOPHAGOGASTRODUODENOSCOPY (EGD) WITH PROPOFOL N/A 04/29/2020   Procedure:  ESOPHAGOGASTRODUODENOSCOPY (EGD) WITH PROPOFOL;  Surgeon: Rush Landmark Telford Nab., MD;  Location: [place];  Service: Gastroenterology;  Laterality: N/A;   ESOPHAGOGASTRODUODENOSCOPY (EGD) WITH PROPOFOL N/A 06/10/2020   Procedure: ESOPHAGOGASTRODUODENOSCOPY (EGD) WITH PROPOFOL;  Surgeon: Yetta Flock, MD;  [facility]: [facility];  Service: Gastroenterology;  Laterality: N/A;   ESOPHAGOGASTRODUODENOSCOPY (EGD) WITH PROPOFOL N/A 11/10/2020   Procedure: ESOPHAGOGASTRODUODENOSCOPY (EGD) WITH PROPOFOL;  Surgeon: Yetta Flock, MD;  [facility]: [facility];  Service: Gastroenterology;  Laterality: N/A;   ESOPHAGOGASTRODUODENOSCOPY (EGD) WITH PROPOFOL N/A 02/10/2021   Procedure: ESOPHAGOGASTRODUODENOSCOPY (EGD) WITH PROPOFOL;  Surgeon: Jerene Bears, MD;  [facility]: [facility];  Service: Gastroenterology;  Laterality: N/A;   ESOPHAGOGASTRODUODENOSCOPY (EGD) WITH PROPOFOL N/A 03/31/2021   Procedure: ESOPHAGOGASTRODUODENOSCOPY (EGD) WITH PROPOFOL;  Surgeon: Yetta Flock, MD;  [facility];  Service: Gastroenterology;  Laterality: N/A;   ESOPHAGOGASTRODUODENOSCOPY (EGD) WITH PROPOFOL N/A 04/21/2021   Procedure: ESOPHAGOGASTRODUODENOSCOPY (EGD) WITH PROPOFOL;  Surgeon: Milus Banister, MD;  Location[facility];  Service: Endoscopy;  Laterality: N/A;   ESOPHAGOGASTRODUODENOSCOPY (EGD) WITH PROPOFOL N/A 07/16/2021   Procedure: ESOPHAGOGASTRODUODENOSCOPY (EGD) WITH PROPOFOL;  Surgeon: Ladene Artist, MD;  Location: [facility];  Service: Gastroenterology;  Laterality:  N/A;   ESOPHAGOGASTRODUODENOSCOPY (EGD) WITH PROPOFOL N/A 10/15/2021   Procedure: ESOPHAGOGASTRODUODENOSCOPY (EGD) WITH PROPOFOL;  Surgeon: Yetta Flock, MD;  Location: [facility];  Service: Gastroenterology;  Laterality: N/A;   ESOPHAGOGASTRODUODENOSCOPY (EGD) WITH PROPOFOL N/A 01/18/2022   Procedure: ESOPHAGOGASTRODUODENOSCOPY (EGD) WITH PROPOFOL;  Surgeon: Gatha Mayer, MD;  Location: [place];   Service: Gastroenterology;  Laterality: N/A;   EYE SURGERY     HERNIA REPAIR     IR INTRAVASCULAR ULTRASOUND NON CORONARY  03/26/2022   IR PARACENTESIS  09/18/2020   IR RADIOLOGIST EVAL & MGMT  10/22/2021   IR THORACENTESIS ASP PLEURAL SPACE W/IMG GUIDE  10/05/2021   IR TIPS  03/26/2022   IR US GUIDE VASC ACCESS RIGHT  03/26/2022   KIDNEY STONE SURGERY     RADIOLOGY WITH ANESTHESIA N/A 03/26/2022   Procedure: TIPS;  Surgeon: Greggory Keen, MD;  Location: [place];  Service: Radiology;  Laterality: N/A;   WISDOM TOOTH EXTRACTION      Social History:  reports that he has never smoked. He has never been exposed to tobacco smoke. He has never used smokeless tobacco. He reports current alcohol use of about 1.0 standard drink of alcohol per week. He reports that he does not use drugs.  Allergies  Allergen Reactions   Lisinopril Cough    Family History  Problem Relation Age of Onset   Liver cancer Mother    Colon cancer Neg Hx    Stomach cancer Neg Hx    Colon polyps Neg Hx    Esophageal cancer Neg Hx    Rectal cancer Neg Hx      Prior to Admission medications   Medication Sig Start Date End Date Taking? Authorizing Provider  acetaminophen (TYLENOL) 325 MG tablet Take 325 mg by mouth every 6 (six) hours as needed for moderate pain.    [provider]  albuterol (VENTOLIN HFA) 108 (90 Base) MCG/ACT inhaler Inhale 1-2 puffs into the lungs every 6 (six) hours as needed for wheezing or shortness of breath.    [provider]  ARTIFICIAL SALIVA MT Use as directed 1 spray in the mouth or throat as needed (Dry mouth). MOUTH COAT 10/26/20   [provider]  buPROPion (WELLBUTRIN XL) 150 MG 24 hr tablet Take 150 mg by mouth daily.    [provider]  carbidopa-levodopa (SINEMET IR) 25-100 MG tablet Take 1 tablet by mouth in the morning, at noon, and at bedtime. 06/30/21   [provider]  carboxymethylcellulose (REFRESH TEARS) 0.5 % SOLN Place 1 drop  into both eyes 3 (three) times daily as needed (dry eyes).    [provider]  fluticasone (FLONASE) 50 MCG/ACT nasal spray Place 1 spray into both nostrils daily as needed for allergies.    [provider]  furosemide (LASIX) 20 MG tablet Take 20 mg by mouth daily.    [provider]  guaiFENesin (DIABETIC TUSSIN EX) 100 MG/5ML liquid Take 10 mLs by mouth every 4 (four) hours as needed for cough or to loosen phlegm.    [provider]  guaiFENesin-codeine (ROBITUSSIN AC) 100-10 MG/5ML syrup Take 2.5 mLs by mouth at bedtime. 01/13/22   [provider]  insulin glargine (LANTUS) 100 unit/mL SOPN Inject 16 Units into the skin daily.    [provider]  ipratropium (ATROVENT) 0.03 % nasal spray Place 1 spray into both nostrils at bedtime.    [provider]  lactulose (CHRONULAC) 10 GM/15ML solution Take 15 mLs (10 g  total) by mouth 3 (three) times daily as needed for mild constipation. Patient not taking: Reported on 03/24/2022 10/21/21   Yetta Flock, MD  latanoprost (XALATAN) 0.005 % ophthalmic solution Place 1 drop into both eyes at bedtime. 08/20/20   [provider]  lidocaine (LIDODERM) 5 % Place 1 patch onto the skin daily as needed (pain). Remove & Discard patch within 12 hours or as directed by MD    [provider]  loratadine (CLARITIN) 10 MG tablet Take 10 mg by mouth daily as needed for allergies. 09/04/20   [provider]  meclizine (ANTIVERT) 25 MG tablet Take 25 mg by mouth 2 (two) times daily as needed for dizziness.    [provider]  midodrine (PROAMATINE) 10 MG tablet Take 1 tablet (10 mg total) by mouth 3 (three) times daily. 10/21/21   Armbruster, Carlota Raspberry, MD  montelukast (SINGULAIR) 10 MG tablet Take 10 mg by mouth daily. 05/22/20   [provider]  Multiple Vitamin (MULTIVITAMIN WITH MINERALS) TABS tablet Take 1 tablet by mouth daily.    [provider]   pantoprazole (PROTONIX) 40 MG tablet Take 40 mg by mouth daily with breakfast.    [provider]  rifaximin (XIFAXAN) 550 MG TABS tablet Take 550 mg by mouth 2 (two) times daily.    [provider]  spironolactone (ALDACTONE) 100 MG tablet Take 50 mg by mouth every other day.    [provider]  tamsulosin (FLOMAX) 0.4 MG CAPS capsule Take 0.4 mg by mouth 2 (two) times daily.    [provider]  traZODone (DESYREL) 150 MG tablet Take 75 mg by mouth at bedtime as needed for sleep. 09/28/19   [provider]    Physical Exam: Vitals:   04/01/22 1728 04/01/22 1840 04/01/22 1845 04/01/22 1900  BP: 121/73 (!) 136/98 138/76   Pulse: 78 82 74 69  Resp: 16 15    Temp: 98.9 F (37.2 C) 98.3 F (36.8 C)    TempSrc: Oral Oral    SpO2: 93% 95% 97% 97%  Height:       Constitutional: Sitting up in bed, NAD, calm, appears comfortable Eyes: EOMI, lids and conjunctivae normal ENMT: Mucous membranes are moist. Posterior pharynx clear of any exudate or lesions.Normal dentition.  Neck: normal, supple, no masses. Respiratory: clear to auscultation bilaterally, no wheezing, no crackles. Normal respiratory effort. No accessory muscle use.  Cardiovascular: Regular rate and rhythm, systolic murmur present. No extremity edema. 2+ pedal pulses. Abdomen: no tenderness, no masses palpated.  Musculoskeletal: no clubbing / cyanosis. No joint deformity upper and lower extremities. Good ROM, no contractures. Normal muscle tone.  Skin: no rashes, lesions, ulcers. No induration Neurologic:  Sensation intact. Strength 5/5 in all 4.  Psychiatric: Awake, alert, oriented to self and recognizes spouse at bedside.  Repetitive answers to questions with delayed response.  EKG: Not performed.  Assessment/Plan Principal Problem:   Acute hepatic encephalopathy (HCC) Active Problems:   Liver cirrhosis secondary to NASH (HCC)   Type 2 diabetes mellitus (HCC)   Portal  hypertension (HCC)   Thrombocytopenia (HCC)   Chronic obstructive pulmonary disease (HCC)   Chronic kidney disease, stage 3a (HCC)   Pleural effusion on left   Ascending aortic aneurysm (HCC)   Chronic left hydroureteronephrosis   Parkinson's disease   Corey Wood is a 72 y.o. male with medical history significant for cryptogenic cirrhosis thought secondary to NASH associated with portal hypertension, esophageal and gastric varices, recurrent  ascites s/p TIPS (03/26/2022), hepatic hydrothorax, CKD stage IIIa, Parkinson's disease, chronic thrombocytopenia, insulin-dependent T2DM who is admitted with hepatic encephalopathy.  Assessment and Plan: * Acute hepatic encephalopathy ([place]) In setting of NASH cirrhosis with decreased bowel movements since TIPS procedure.  Ammonia 99. -Increase lactulose to 30 g 3 times daily, titrate to 3 BMs per day -Continue rifaximin 550 mg twice daily  Liver cirrhosis secondary to NASH (HCC) Portal hypertension Esophageal and gastric varices with history of recurrent bleeding Chronic hepatic hydrothorax S/p TIPS 03/26/2022 Admitted with acute hepatic encephalopathy as above.  Volume status appears stable without significant ascites.  No dyspnea from hydrothorax. -Continue spironolactone 50 mg every other day -Continue Lasix 20 mg daily -Continue midodrine for BP support -Has not been on beta-blocker due to hypotension and fall -Increase lactulose and continue rifaximin as above  Parkinson's disease Continue Sinemet.  Chronic left hydroureteronephrosis Noted on CT imaging.  Ascending aortic aneurysm (HCC) 4.1 cm ascending aortic aneurysm seen on CT chest.  Annual imaging follow-up by CTA or MRI recommended.  Chronic kidney disease, stage 3a ([place]) Renal function improved from baseline, monitor.  Chronic obstructive pulmonary disease (HCC) Stable, no wheezing on admission.  Continue albuterol as needed.  Thrombocytopenia (HCC) Chronic  thrombocytopenia secondary to cirrhosis.  Platelets stable without obvious bleeding.  Type 2 diabetes mellitus ([place]) Hold home meds and placed on SSI.  DVT prophylaxis: SCDs Start: 04/01/22 2029 Code Status: DNR, confirmed with spouse on admission Family Communication: Spouse at bedside Disposition Plan: From home, dispo pending clinical progress Consults called: [facility] to follow Severity of Illness: The appropriate patient status for this patient is OBSERVATION. Observation status is judged to be reasonable and necessary in order to provide the required intensity of service to ensure the patient's safety. The patient's presenting symptoms, physical exam findings, and initial radiographic and laboratory data in the context of their medical condition is felt to place them at decreased risk for further clinical deterioration. Furthermore, it is anticipated that the patient will be medically stable for discharge from the hospital within 2 midnights of admission.   Zada Finders MD [facility]  If 7PM-7AM, please contact night-coverage [email or web address]  04/01/2022, 9:28 PM

## 2022-04-01 NOTE — Assessment & Plan Note (Signed)
Noted on CT imaging.

## 2022-04-01 NOTE — Telephone Encounter (Signed)
error 

## 2022-04-01 NOTE — Assessment & Plan Note (Addendum)
Creatinine currently near baseline  Input and output monitoring  Monitoring renal function and electrolytes with serial chemistries

## 2022-04-01 NOTE — ED Provider Notes (Signed)
[facility] DEPT Provider Note   CSN: [id number] Arrival date & time: 04/01/22  1044     History  Chief Complaint  Patient presents with   Altered Mental Status   Weakness   abdominal distention   Constipation    Corey Wood is a 72 y.o. male.  72 yo M with a chief complaints of altered mental status.  The patient had a TIPS procedure performed about a week ago.  Has progressively declined since then not really eating and drinking for the past few days.  No abdominal pain no cough no fever no urinary symptoms.  No head injuries no headache.  Has been taking his medicines at home as prescribed.  Saw his gastroenterologist today was concerned he had hepatic encephalopathy and recommended he come here to be admitted.   Altered Mental Status Associated symptoms: weakness   Weakness Constipation      Home Medications Prior to Admission medications   Medication Sig Start Date End Date Taking? Authorizing Provider  acetaminophen (TYLENOL) 325 MG tablet Take 325 mg by mouth every 6 (six) hours as needed for moderate pain.    [provider]  albuterol (VENTOLIN HFA) 108 (90 Base) MCG/ACT inhaler Inhale 1-2 puffs into the lungs every 6 (six) hours as needed for wheezing or shortness of breath.    [provider]  ARTIFICIAL SALIVA MT Use as directed 1 spray in the mouth or throat as needed (Dry mouth). MOUTH COAT 10/26/20   [provider]  buPROPion (WELLBUTRIN XL) 150 MG 24 hr tablet Take 150 mg by mouth daily.    [provider]  carbidopa-levodopa (SINEMET IR) 25-100 MG tablet Take 1 tablet by mouth in the morning, at noon, and at bedtime. 06/30/21   [provider]  fluticasone (FLONASE) 50 MCG/ACT nasal spray Place 1 spray into both nostrils daily as needed for allergies.    [provider]  furosemide (LASIX) 20 MG tablet Take 20 mg by mouth daily.    [provider]  guaiFENesin  (DIABETIC TUSSIN EX) 100 MG/5ML liquid Take 10 mLs by mouth every 4 (four) hours as needed for cough or to loosen phlegm.    [provider]  insulin glargine (LANTUS) 100 unit/mL SOPN Inject 16 Units into the skin daily.    [provider]  ipratropium (ATROVENT) 0.03 % nasal spray Place 1 spray into both nostrils at bedtime.    [provider]  latanoprost (XALATAN) 0.005 % ophthalmic solution Place 1 drop into both eyes at bedtime. 08/20/20   [provider]  lidocaine (LIDODERM) 5 % Place 1 patch onto the skin daily as needed (pain). Remove & Discard patch within 12 hours or as directed by MD    [provider]  loratadine (CLARITIN) 10 MG tablet Take 10 mg by mouth daily as needed for allergies. 09/04/20   [provider]  meclizine (ANTIVERT) 25 MG tablet Take 25 mg by mouth 2 (two) times daily as needed for dizziness.    [provider]  midodrine (PROAMATINE) 10 MG tablet Take 1 tablet (10 mg total) by mouth 3 (three) times daily. 10/21/21   Armbruster, Carlota Raspberry, MD  montelukast (SINGULAIR) 10 MG tablet Take 10 mg by mouth daily. 05/22/20   [provider]  Multiple Vitamin (MULTIVITAMIN WITH MINERALS) TABS tablet Take 1 tablet by mouth daily.    [provider]  pantoprazole (PROTONIX) 40 MG tablet Take 40 mg by mouth daily with  breakfast.    [provider]  rifaximin (XIFAXAN) 550 MG TABS tablet Take 550 mg by mouth 2 (two) times daily.    [provider]  spironolactone (ALDACTONE) 100 MG tablet Take 50 mg by mouth every other day.    [provider]  tamsulosin (FLOMAX) 0.4 MG CAPS capsule Take 0.4 mg by mouth 2 (two) times daily.    [provider]  traZODone (DESYREL) 150 MG tablet Take 75 mg by mouth at bedtime as needed for sleep. 09/28/19   [provider]      Allergies    Lisinopril    Review of Systems   Review of Systems  Gastrointestinal:  Positive for  constipation.  Neurological:  Positive for weakness.    Physical Exam Updated Vital Signs BP 138/76   Pulse 69   Temp 98.3 F (36.8 C) (Oral)   Resp 15   Ht '5\' 6"'$  (1.676 m)   SpO2 97%   BMI 25.76 kg/m  Physical Exam Vitals and nursing note reviewed.  Constitutional:      Appearance: He is well-developed.  HENT:     Head: Normocephalic and atraumatic.  Eyes:     Pupils: Pupils are equal, round, and reactive to light.  Neck:     Vascular: No JVD.  Cardiovascular:     Rate and Rhythm: Normal rate and regular rhythm.     Heart sounds: No murmur heard.    No friction rub. No gallop.  Pulmonary:     Effort: No respiratory distress.     Breath sounds: No wheezing.  Abdominal:     General: There is no distension.     Tenderness: There is no abdominal tenderness. There is no guarding or rebound.     Comments: Positive fluid wave.  Distention.  Nontender.  Musculoskeletal:        General: Normal range of motion.     Cervical back: Normal range of motion and neck supple.  Skin:    Coloration: Skin is not pale.     Findings: No rash.  Neurological:     Mental Status: He is alert and oriented to person, place, and time.     Comments: [place]   Psychiatric:        Behavior: Behavior normal.     ED Results / Procedures / Treatments   Labs (all labs ordered are listed, but only abnormal results are displayed) Labs Reviewed  CBC WITH DIFFERENTIAL/PLATELET - Abnormal; Notable for the following components:      Result Value   RBC 3.71 (*)    Hemoglobin 11.5 (*)    HCT 35.1 (*)    RDW 16.9 (*)    Platelets 105 (*)    All other components within normal limits  COMPREHENSIVE METABOLIC PANEL - Abnormal; Notable for the following components:   Glucose, Bld 149 (*)    Total Protein 8.4 (*)    Albumin 3.1 (*)    Alkaline Phosphatase 129 (*)    Total Bilirubin 1.3 (*)    All other components within normal limits  AMMONIA - Abnormal; Notable for the following components:    Ammonia 99 (*)    All other components within normal limits  PROTIME-INR - Abnormal; Notable for the following components:   Prothrombin Time 15.4 (*)    All other components within normal limits  CBG MONITORING, ED - Abnormal; Notable for the following components:   Glucose-Capillary 151 (*)    All other components within normal limits  CBG MONITORING, ED - Abnormal; Notable for the following components:   Glucose-Capillary 140 (*)    All other components within normal limits  LIPASE, BLOOD  LACTIC ACID, PLASMA  LACTIC ACID, PLASMA  URINALYSIS, ROUTINE W REFLEX MICROSCOPIC  COMPREHENSIVE METABOLIC PANEL  CBC  PROTIME-INR    EKG None  Radiology CT CHEST ABDOMEN PELVIS W CONTRAST  Result Date: 04/01/2022 CLINICAL DATA:  Status post TIPS creation on 03/26/2022 with increasing confusion and abdominal distention EXAM: CT CHEST, ABDOMEN, AND PELVIS WITH CONTRAST TECHNIQUE: Multidetector CT imaging of the chest, abdomen and pelvis was performed following the standard protocol during bolus administration of intravenous contrast. RADIATION DOSE REDUCTION: This exam was performed according to the departmental dose-optimization program which includes automated exposure control, adjustment of the mA and/or kV according to patient size and/or use of iterative reconstruction technique. CONTRAST:  39m OMNIPAQUE IOHEXOL 300 MG/ML  SOLN COMPARISON:  CTA abdomen and pelvis dated 02/09/2022, CT chest dated 05/06/2021 FINDINGS: CT CHEST FINDINGS Cardiovascular: Normal heart size. No significant pericardial fluid/thickening. Coronary artery calcifications. Ascending aorta measures 4.1 cm. No central pulmonary emboli. Mediastinum/Nodes: Imaged thyroid gland without nodules meeting criteria for imaging follow-up by size. Normal esophagus. No pathologically enlarged axillary, supraclavicular, mediastinal, or hilar lymph nodes. Lungs/Pleura: The central airways are patent. Persistent near-complete collapse of  bilateral lower lobes. Mild interlobular septal thickening, left-greater-than-right. No pneumothorax. Similar moderate left pleural effusion and decreased small right pleural effusion. Musculoskeletal: No acute or abnormal lytic or blastic osseous lesions. CT ABDOMEN PELVIS FINDINGS Hepatobiliary: No focal hepatic lesions. No intra or extrahepatic biliary ductal dilation. Cholecystectomy. Pancreas: No focal lesions or main ductal dilation. Spleen: Unchanged splenomegaly measures 17.0 cm. Adrenals/Urinary Tract: No adrenal nodules. Normal appearance of the right kidney without suspicious renal mass, calculi, or hydronephrosis. Chronic left hydroureteronephrosis with atrophic parenchyma. Similar dependent 2 mm stone in the distal left ureter. No focal bladder wall thickening. Stomach/Bowel: Normal appearance of the stomach. Duodenal diverticulum in the fourth portion. No evidence of bowel wall thickening, distention, or inflammatory changes. Normal appendix. Vascular/Lymphatic: Interval TIPS creation, which appears patent. Aortic atherosclerosis. No enlarged abdominal or pelvic lymph nodes. Reproductive: Prostate is unremarkable. Other: No free fluid, fluid collection, or free air. Musculoskeletal: No acute or abnormal lytic or blastic osseous findings. Unchanged T12 compression deformity. IMPRESSION: CHEST: 1. Similar moderate left pleural effusion and decreased small right pleural effusion with persistent near-complete collapse of bilateral lower lobes. 2. Mild interlobular septal thickening, suggestive of mild pulmonary edema. 3. Ascending aortic aneurysm measures 4.1 cm. Recommend annual imaging followup by CTA or MRA. This recommendation follows 2010 ACCF/AHA/AATS/ACR/ASA/SCA/SCAI/SIR/STS/SVM Guidelines for the Diagnosis and Management of Patients with Thoracic Aortic Disease. Circulation. 2010; 121:: E527-P824 Aortic aneurysm NOS (ICD10-I71.9) ABDOMEN/PELVIS: 1. No acute abnormality in the abdomen or pelvis. 2.  Interval TIPS creation, which appears patent. 3. Chronic left hydroureteronephrosis with atrophic parenchyma. Similar dependent 2 mm stone in the distal left ureter. 4. Unchanged splenomegaly. 5. Aortic Atherosclerosis (ICD10-I70.0). Electronically Signed   By: LDarrin NipperM.D.   On: 04/01/2022 15:28   CT Head Wo Contrast  Result Date: 04/01/2022 CLINICAL DATA:  Delirium.  Confusion. EXAM: CT HEAD WITHOUT CONTRAST TECHNIQUE: Contiguous axial images were obtained from the base of the skull through the vertex without intravenous contrast. RADIATION DOSE REDUCTION: This exam was performed according to the departmental dose-optimization program which includes automated exposure control, adjustment of the mA and/or kV according to patient size and/or use of iterative reconstruction technique. COMPARISON:  CT Head 08/16/21  FINDINGS: Brain: No evidence of acute infarction, hemorrhage, hydrocephalus, extra-axial collection or mass lesion/mass effect. Mineralization of the basal ganglia bilaterally. Sequela of moderate chronic microvascular ischemic change. Vascular: No hyperdense vessel or unexpected calcification. Unchanged arachnoid granulation in the superior sagittal sinus. Skull: Normal. Negative for fracture or focal lesion. Sinuses/Orbits: Bilateral lens replacement. No mastoid or middle ear effusion. Paranasal sinuses appear clear. Other: None. IMPRESSION: 1. No acute intracranial process. 2. Sequela of moderate chronic microvascular ischemic change. Electronically Signed   By: Marin Roberts M.D.   On: 04/01/2022 15:01    Procedures Procedures    Medications Ordered in ED Medications  lactulose (CHRONULAC) 10 GM/15ML solution 30 g (has no administration in time range)  rifaximin (XIFAXAN) tablet 550 mg (has no administration in time range)  ondansetron (ZOFRAN) tablet 4 mg (has no administration in time range)    Or  ondansetron (ZOFRAN) injection 4 mg (has no administration in time range)  insulin  aspart (novoLOG) injection 0-9 Units (has no administration in time range)  albuterol (VENTOLIN HFA) 108 (90 Base) MCG/ACT inhaler 1-2 puff (has no administration in time range)  buPROPion (WELLBUTRIN XL) 24 hr tablet 150 mg (has no administration in time range)  carbidopa-levodopa (SINEMET IR) 25-100 MG per tablet immediate release 1 tablet (has no administration in time range)  furosemide (LASIX) tablet 20 mg (has no administration in time range)  midodrine (PROAMATINE) tablet 10 mg (has no administration in time range)  montelukast (SINGULAIR) tablet 10 mg (has no administration in time range)  pantoprazole (PROTONIX) EC tablet 40 mg (has no administration in time range)  spironolactone (ALDACTONE) tablet 50 mg (has no administration in time range)  tamsulosin (FLOMAX) capsule 0.4 mg (has no administration in time range)  sodium chloride (PF) 0.9 % injection (  Given by Other 04/01/22 1836)  iohexol (OMNIPAQUE) 300 MG/ML solution 80 mL (80 mLs Intravenous Contrast Given 04/01/22 1410)    ED Course/ Medical Decision Making/ A&P                           Medical Decision Making Amount and/or Complexity of Data Reviewed Labs: ordered.  Risk Prescription drug management. Decision regarding hospitalization.   72 yo M with a chief complaints of sounds like failure to thrive and then progressive altered mental status.  Saw his gastroenterologist today Dr. Havery Moros, has a note in the system that I reviewed.  Concern for hepatic encephalopathy.  Clinically has not on exam.  Ammonia level here is 100.  Will give a dose of lactulose a dose of rifaximin.  Check INR as that was not checked upfront.  LFTs and total bilirubin not significantly elevated.  Lipase is normal.  Will discuss with medicine for admission.  The patients results and plan were reviewed and discussed.   Any x-rays performed were independently reviewed by myself.   Differential diagnosis were considered with the presenting  HPI.  Medications  lactulose (CHRONULAC) 10 GM/15ML solution 30 g (has no administration in time range)  rifaximin (XIFAXAN) tablet 550 mg (has no administration in time range)  ondansetron (ZOFRAN) tablet 4 mg (has no administration in time range)    Or  ondansetron (ZOFRAN) injection 4 mg (has no administration in time range)  insulin aspart (novoLOG) injection 0-9 Units (has no administration in time range)  albuterol (VENTOLIN HFA) 108 (90 Base) MCG/ACT inhaler 1-2 puff (has no administration in time range)  buPROPion (WELLBUTRIN XL) 24 hr tablet 150  mg (has no administration in time range)  carbidopa-levodopa (SINEMET IR) 25-100 MG per tablet immediate release 1 tablet (has no administration in time range)  furosemide (LASIX) tablet 20 mg (has no administration in time range)  midodrine (PROAMATINE) tablet 10 mg (has no administration in time range)  montelukast (SINGULAIR) tablet 10 mg (has no administration in time range)  pantoprazole (PROTONIX) EC tablet 40 mg (has no administration in time range)  spironolactone (ALDACTONE) tablet 50 mg (has no administration in time range)  tamsulosin (FLOMAX) capsule 0.4 mg (has no administration in time range)  sodium chloride (PF) 0.9 % injection (  Given by Other 04/01/22 1836)  iohexol (OMNIPAQUE) 300 MG/ML solution 80 mL (80 mLs Intravenous Contrast Given 04/01/22 1410)    Vitals:   04/01/22 1728 04/01/22 1840 04/01/22 1845 04/01/22 1900  BP: 121/73 (!) 136/98 138/76   Pulse: 78 82 74 69  Resp: 16 15    Temp: 98.9 F (37.2 C) 98.3 F (36.8 C)    TempSrc: Oral Oral    SpO2: 93% 95% 97% 97%  Height:        Final diagnoses:  Hepatic encephalopathy ([place])    Admission/ observation were discussed with the admitting physician, patient and/or family and they are comfortable with the plan.          Final Clinical Impression(s) / ED Diagnoses Final diagnoses:  Hepatic encephalopathy [facility])    Rx / DC Orders ED Discharge Orders      None         Deno Etienne, DO 04/01/22 2132

## 2022-04-01 NOTE — Assessment & Plan Note (Signed)
4.1 cm ascending aortic aneurysm seen on CT chest.  Annual imaging follow-up by CTA or MRI recommended.

## 2022-04-02 ENCOUNTER — Telehealth: Payer: Self-pay

## 2022-04-02 DIAGNOSIS — N1831 Chronic kidney disease, stage 3a: Secondary | ICD-10-CM | POA: Diagnosis present

## 2022-04-02 DIAGNOSIS — J449 Chronic obstructive pulmonary disease, unspecified: Secondary | ICD-10-CM | POA: Diagnosis present

## 2022-04-02 DIAGNOSIS — J90 Pleural effusion, not elsewhere classified: Secondary | ICD-10-CM | POA: Diagnosis present

## 2022-04-02 DIAGNOSIS — E669 Obesity, unspecified: Secondary | ICD-10-CM | POA: Diagnosis present

## 2022-04-02 DIAGNOSIS — Z66 Do not resuscitate: Secondary | ICD-10-CM | POA: Diagnosis present

## 2022-04-02 DIAGNOSIS — E871 Hypo-osmolality and hyponatremia: Secondary | ICD-10-CM | POA: Diagnosis present

## 2022-04-02 DIAGNOSIS — Z95828 Presence of other vascular implants and grafts: Secondary | ICD-10-CM | POA: Diagnosis not present

## 2022-04-02 DIAGNOSIS — E1122 Type 2 diabetes mellitus with diabetic chronic kidney disease: Secondary | ICD-10-CM | POA: Diagnosis present

## 2022-04-02 DIAGNOSIS — I7121 Aneurysm of the ascending aorta, without rupture: Secondary | ICD-10-CM | POA: Diagnosis present

## 2022-04-02 DIAGNOSIS — F32A Depression, unspecified: Secondary | ICD-10-CM | POA: Diagnosis present

## 2022-04-02 DIAGNOSIS — R188 Other ascites: Secondary | ICD-10-CM | POA: Diagnosis present

## 2022-04-02 DIAGNOSIS — K7581 Nonalcoholic steatohepatitis (NASH): Secondary | ICD-10-CM | POA: Diagnosis not present

## 2022-04-02 DIAGNOSIS — N289 Disorder of kidney and ureter, unspecified: Secondary | ICD-10-CM | POA: Diagnosis not present

## 2022-04-02 DIAGNOSIS — I9589 Other hypotension: Secondary | ICD-10-CM | POA: Diagnosis present

## 2022-04-02 DIAGNOSIS — K7290 Hepatic failure, unspecified without coma: Secondary | ICD-10-CM | POA: Diagnosis not present

## 2022-04-02 DIAGNOSIS — K766 Portal hypertension: Secondary | ICD-10-CM | POA: Diagnosis present

## 2022-04-02 DIAGNOSIS — D469 Myelodysplastic syndrome, unspecified: Secondary | ICD-10-CM | POA: Diagnosis present

## 2022-04-02 DIAGNOSIS — K567 Ileus, unspecified: Secondary | ICD-10-CM | POA: Diagnosis not present

## 2022-04-02 DIAGNOSIS — J9811 Atelectasis: Secondary | ICD-10-CM | POA: Diagnosis present

## 2022-04-02 DIAGNOSIS — J918 Pleural effusion in other conditions classified elsewhere: Secondary | ICD-10-CM | POA: Diagnosis present

## 2022-04-02 DIAGNOSIS — Q600 Renal agenesis, unilateral: Secondary | ICD-10-CM | POA: Diagnosis not present

## 2022-04-02 DIAGNOSIS — E7220 Disorder of urea cycle metabolism, unspecified: Secondary | ICD-10-CM

## 2022-04-02 DIAGNOSIS — Z515 Encounter for palliative care: Secondary | ICD-10-CM | POA: Diagnosis not present

## 2022-04-02 DIAGNOSIS — K7682 Hepatic encephalopathy: Secondary | ICD-10-CM | POA: Diagnosis present

## 2022-04-02 DIAGNOSIS — Z7189 Other specified counseling: Secondary | ICD-10-CM | POA: Diagnosis not present

## 2022-04-02 DIAGNOSIS — I8510 Secondary esophageal varices without bleeding: Secondary | ICD-10-CM | POA: Diagnosis present

## 2022-04-02 DIAGNOSIS — K7460 Unspecified cirrhosis of liver: Secondary | ICD-10-CM | POA: Diagnosis not present

## 2022-04-02 DIAGNOSIS — N1330 Unspecified hydronephrosis: Secondary | ICD-10-CM | POA: Diagnosis present

## 2022-04-02 DIAGNOSIS — Z8616 Personal history of COVID-19: Secondary | ICD-10-CM | POA: Diagnosis not present

## 2022-04-02 DIAGNOSIS — D696 Thrombocytopenia, unspecified: Secondary | ICD-10-CM | POA: Diagnosis not present

## 2022-04-02 DIAGNOSIS — D61818 Other pancytopenia: Secondary | ICD-10-CM | POA: Diagnosis present

## 2022-04-02 DIAGNOSIS — I129 Hypertensive chronic kidney disease with stage 1 through stage 4 chronic kidney disease, or unspecified chronic kidney disease: Secondary | ICD-10-CM | POA: Diagnosis present

## 2022-04-02 LAB — COMPREHENSIVE METABOLIC PANEL
ALT: 16 U/L (ref 0–44)
AST: 32 U/L (ref 15–41)
Albumin: 2.8 g/dL — ABNORMAL LOW (ref 3.5–5.0)
Alkaline Phosphatase: 102 U/L (ref 38–126)
Anion gap: 9 (ref 5–15)
BUN: 16 mg/dL (ref 8–23)
CO2: 27 mmol/L (ref 22–32)
Calcium: 9.2 mg/dL (ref 8.9–10.3)
Chloride: 102 mmol/L (ref 98–111)
Creatinine, Ser: 1.21 mg/dL (ref 0.61–1.24)
GFR, Estimated: >60 mL/min (ref >60)
Glucose, Bld: 137 mg/dL — ABNORMAL HIGH (ref 70–99)
Potassium: 3.5 mmol/L (ref 3.5–5.1)
Sodium: 138 mmol/L (ref 135–145)
Total Bilirubin: 1.2 mg/dL (ref 0.3–1.2)
Total Protein: 7.4 g/dL (ref 6.5–8.1)

## 2022-04-02 LAB — URINALYSIS, ROUTINE W REFLEX MICROSCOPIC
Bacteria, UA: NONE SEEN
Bilirubin Urine: NEGATIVE
Glucose, UA: NEGATIVE mg/dL
Ketones, ur: 5 mg/dL — AB
Leukocytes,Ua: NEGATIVE
Nitrite: NEGATIVE
Protein, ur: 30 mg/dL — AB
Specific Gravity, Urine: 1.018 (ref 1.005–1.030)
pH: 6 (ref 5.0–8.0)

## 2022-04-02 LAB — CBC
HCT: 31.8 % — ABNORMAL LOW (ref 39.0–52.0)
Hemoglobin: 10.4 g/dL — ABNORMAL LOW (ref 13.0–17.0)
MCH: 31.1 pg (ref 26.0–34.0)
MCHC: 32.7 g/dL (ref 30.0–36.0)
MCV: 95.2 fL (ref 80.0–100.0)
Platelets: 100 10*3/uL — ABNORMAL LOW (ref 150–400)
RBC: 3.34 MIL/uL — ABNORMAL LOW (ref 4.22–5.81)
RDW: 16.9 % — ABNORMAL HIGH (ref 11.5–15.5)
WBC: 3.7 10*3/uL — ABNORMAL LOW (ref 4.0–10.5)
nRBC: 0 % (ref 0.0–0.2)

## 2022-04-02 LAB — PROTIME-INR
INR: 1.3 — ABNORMAL HIGH (ref 0.8–1.2)
Prothrombin Time: 16.1 seconds — ABNORMAL HIGH (ref 11.4–15.2)

## 2022-04-02 LAB — GLUCOSE, CAPILLARY
Glucose-Capillary: 157 mg/dL — ABNORMAL HIGH (ref 70–99)
Glucose-Capillary: 207 mg/dL — ABNORMAL HIGH (ref 70–99)
Glucose-Capillary: 154 mg/dL — ABNORMAL HIGH (ref 70–99)
Glucose-Capillary: 188 mg/dL — ABNORMAL HIGH (ref 70–99)

## 2022-04-02 MED ORDER — RIFAXIMIN 550 MG PO TABS: 550.0000 mg | ORAL_TABLET | Freq: Two times a day (BID) | ORAL | 6 refills | Status: DC

## 2022-04-02 MED ORDER — BUPROPION HCL 75 MG PO TABS
75.0000 mg | ORAL_TABLET | Freq: Every day | ORAL | Status: DC
  Administered 2022-04-03: 75 mg via ORAL
  Filled 2022-04-02: qty 1

## 2022-04-02 NOTE — Plan of Care (Signed)
  Problem: Education: Goal: Understanding of CV disease, CV risk reduction, and recovery process will improve Outcome: Progressing   Problem: Activity: Goal: Ability to return to baseline activity level will improve Outcome: Progressing   Problem: Cardiovascular: Goal: Ability to achieve and maintain adequate cardiovascular perfusion will improve Outcome: Progressing   Problem: Health Behavior/Discharge Planning: Goal: Ability to safely manage health-related needs after discharge will improve Outcome: Progressing   

## 2022-04-02 NOTE — Progress Notes (Signed)
Daily Progress Note  Hospital Day: 2  Chief Complaint: decompensated cirrhosis  Assessment   Patient profile:  Corey Wood is a 73 y.o. male with a pmh of cryptogenic cirrhosis (thought to be due to NASH) complicated by portal HTN with history of recurrent variceal bleed ( esophageal varices and GOV2 gastric varices), TIPS placement 12/29, hepatic hydrothorax, and hepatic encephalopathy, AAA.   Has been followed by hepatology with [facility] in the past, followed also by multiple subspecialists at the [facility] for other medical problems. Admitted 1/4 following outpatient visit with Dr. Havery Moros where patient was found to be encephalopathic.   Please see that office note for detailed history of cirrhosis.   # Decompensated cirrhosis with hepatic encephalopathy. Etiologies of HE? No evidence for GI bleed. HE possibly related to recently placed TIPS. Constipation may also be culprit as he was having insufficient BMs at home on lactulose (though he was taking Xifaxan). Infection needs to be excluded though seems less likely with normal WBC  Today:  VSS, afebrile. Somnolent on exam. Having BMs with lactulose.  Cr 1.21. Alb low but liver chemistries o/w normal. WBC 3.7, hgb stable at 10.4. INR 1.3.     # Pleural effusion on CT chest - Similar moderate left pleural effusion and decreased small right pleural effusion with persistent near-complete collapse of bilateral lower lobes.  Plan:    Ordered UA If not improving over next couple of days with xifaxan and lactulose then consider thoracentesis If not improving with aggressive medical therapy and negative infectious workup negative may need shunt reduction.  I sent a message to office nurse to please send in refill for Xifaxan to patient's pharmacy. Wife is very worried as his home supply is almost out and has no refills.   Subjective  Wife in room - emotional.  Patient somnolent Just had BM   Objective   Imaging:  CT  CHEST ABDOMEN PELVIS W CONTRAST  Result Date: 04/01/2022 CLINICAL DATA:  Status post TIPS creation on 03/26/2022 with increasing confusion and abdominal distention EXAM: CT CHEST, ABDOMEN, AND PELVIS WITH CONTRAST TECHNIQUE: Multidetector CT imaging of the chest, abdomen and pelvis was performed following the standard protocol during bolus administration of intravenous contrast. RADIATION DOSE REDUCTION: This exam was performed according to the departmental dose-optimization program which includes automated exposure control, adjustment of the mA and/or kV according to patient size and/or use of iterative reconstruction technique. CONTRAST:  59m OMNIPAQUE IOHEXOL 300 MG/ML  SOLN COMPARISON:  CTA abdomen and pelvis dated 02/09/2022, CT chest dated 05/06/2021 FINDINGS: CT CHEST FINDINGS Cardiovascular: Normal heart size. No significant pericardial fluid/thickening. Coronary artery calcifications. Ascending aorta measures 4.1 cm. No central pulmonary emboli. Mediastinum/Nodes: Imaged thyroid gland without nodules meeting criteria for imaging follow-up by size. Normal esophagus. No pathologically enlarged axillary, supraclavicular, mediastinal, or hilar lymph nodes. Lungs/Pleura: The central airways are patent. Persistent near-complete collapse of bilateral lower lobes. Mild interlobular septal thickening, left-greater-than-right. No pneumothorax. Similar moderate left pleural effusion and decreased small right pleural effusion. Musculoskeletal: No acute or abnormal lytic or blastic osseous lesions. CT ABDOMEN PELVIS FINDINGS Hepatobiliary: No focal hepatic lesions. No intra or extrahepatic biliary ductal dilation. Cholecystectomy. Pancreas: No focal lesions or main ductal dilation. Spleen: Unchanged splenomegaly measures 17.0 cm. Adrenals/Urinary Tract: No adrenal nodules. Normal appearance of the right kidney without suspicious renal mass, calculi, or hydronephrosis. Chronic left hydroureteronephrosis with atrophic  parenchyma. Similar dependent 2 mm stone in the distal left ureter. No focal bladder wall  thickening. Stomach/Bowel: Normal appearance of the stomach. Duodenal diverticulum in the fourth portion. No evidence of bowel wall thickening, distention, or inflammatory changes. Normal appendix. Vascular/Lymphatic: Interval TIPS creation, which appears patent. Aortic atherosclerosis. No enlarged abdominal or pelvic lymph nodes. Reproductive: Prostate is unremarkable. Other: No free fluid, fluid collection, or free air. Musculoskeletal: No acute or abnormal lytic or blastic osseous findings. Unchanged T12 compression deformity. IMPRESSION: CHEST: 1. Similar moderate left pleural effusion and decreased small right pleural effusion with persistent near-complete collapse of bilateral lower lobes. 2. Mild interlobular septal thickening, suggestive of mild pulmonary edema. 3. Ascending aortic aneurysm measures 4.1 cm. Recommend annual imaging followup by CTA or MRA. This recommendation follows 2010 ACCF/AHA/AATS/ACR/ASA/SCA/SCAI/SIR/STS/SVM Guidelines for the Diagnosis and Management of Patients with Thoracic Aortic Disease. Circulation. 2010; 121: R678-L381. Aortic aneurysm NOS (ICD10-I71.9) ABDOMEN/PELVIS: 1. No acute abnormality in the abdomen or pelvis. 2. Interval TIPS creation, which appears patent. 3. Chronic left hydroureteronephrosis with atrophic parenchyma. Similar dependent 2 mm stone in the distal left ureter. 4. Unchanged splenomegaly. 5. Aortic Atherosclerosis (ICD10-I70.0). Electronically Signed   By: Darrin Nipper M.D.   On: 04/01/2022 15:28   CT Head Wo Contrast  Result Date: 04/01/2022 CLINICAL DATA:  Delirium.  Confusion. EXAM: CT HEAD WITHOUT CONTRAST TECHNIQUE: Contiguous axial images were obtained from the base of the skull through the vertex without intravenous contrast. RADIATION DOSE REDUCTION: This exam was performed according to the departmental dose-optimization program which includes automated exposure  control, adjustment of the mA and/or kV according to patient size and/or use of iterative reconstruction technique. COMPARISON:  CT Head 08/16/21 FINDINGS: Brain: No evidence of acute infarction, hemorrhage, hydrocephalus, extra-axial collection or mass lesion/mass effect. Mineralization of the basal ganglia bilaterally. Sequela of moderate chronic microvascular ischemic change. Vascular: No hyperdense vessel or unexpected calcification. Unchanged arachnoid granulation in the superior sagittal sinus. Skull: Normal. Negative for fracture or focal lesion. Sinuses/Orbits: Bilateral lens replacement. No mastoid or middle ear effusion. Paranasal sinuses appear clear. Other: None. IMPRESSION: 1. No acute intracranial process. 2. Sequela of moderate chronic microvascular ischemic change. Electronically Signed   By: Marin Roberts M.D.   On: 04/01/2022 15:01    Lab Results: Recent Labs    04/01/22 1201 04/02/22 0343  WBC 4.6 3.7*  HGB 11.5* 10.4*  HCT 35.1* 31.8*  PLT 105* 100*   BMET Recent Labs    04/01/22 1201 04/02/22 0343  NA 137 138  K 3.7 3.5  CL 100 102  CO2 27 27  GLUCOSE 149* 137*  BUN 14 16  CREATININE 1.16 1.21  CALCIUM 9.4 9.2   LFT Recent Labs    04/02/22 0343  PROT 7.4  ALBUMIN 2.8*  AST 32  ALT 16  ALKPHOS 102  BILITOT 1.2   PT/INR Recent Labs    04/01/22 1900 04/02/22 0343  LABPROT 15.4* 16.1*  INR 1.2 1.3*     Scheduled inpatient medications:   [START ON 04/03/2022] buPROPion  75 mg Oral Daily   carbidopa-levodopa  1 tablet Oral TID   furosemide  20 mg Oral Daily   insulin aspart  0-9 Units Subcutaneous TID WC   lactulose  30 g Oral TID   midodrine  10 mg Oral TID with meals   montelukast  10 mg Oral Daily   pantoprazole  40 mg Oral Q breakfast   rifaximin  550 mg Oral BID   spironolactone  50 mg Oral QODAY   tamsulosin  0.4 mg Oral BID   Continuous inpatient infusions:  PRN inpatient medications: albuterol, ondansetron **OR** ondansetron (ZOFRAN)  IV  Vital signs in last 24 hours: Temp:  [97.3 F (36.3 C)-99.2 F (37.3 C)] 98.9 F (37.2 C) (01/05 1315) Pulse Rate:  [64-102] 102 (01/05 1315) Resp:  [15-18] 16 (01/05 1315) BP: (117-138)/(65-98) 135/80 (01/05 1315) SpO2:  [93 %-97 %] 95 % (01/05 1315) Weight:  [70.1 kg-70.5 kg] 70.5 kg (01/05 0500) Last BM Date : 04/02/22  Intake/Output Summary (Last 24 hours) at 04/02/2022 1334 Last data filed at 04/02/2022 1159 Gross per 24 hour  Intake 120 ml  Output --  Net 120 ml    Intake/Output from previous day: No intake/output data recorded. Intake/Output this shift: Total I/O In: 120 [P.O.:120] Out: -    Physical Exam:  General: Somnolent male in NAD Heart:  Regular rate and rhythm.  Pulmonary: Normal respiratory effort Abdomen: Soft, mildly distended and tympanitic,  nontender. Normal bowel sounds. Extremities: No lower extremity edema  Neurologic: Somnolent, no asterixis but couldn't really participate in exam.   Principal Problem:   Acute hepatic encephalopathy (HCC) Active Problems:   Type 2 diabetes mellitus (HCC)   Liver cirrhosis secondary to NASH (HCC)   Portal hypertension (HCC)   Thrombocytopenia (HCC)   Chronic obstructive pulmonary disease (HCC)   Chronic kidney disease, stage 3a (HCC)   Pleural effusion on left   Ascending aortic aneurysm (HCC)   Chronic left hydroureteronephrosis   Parkinson's disease     LOS: 0 days   Tye Savoy ,NP 04/02/2022, 1:34 PM

## 2022-04-02 NOTE — Telephone Encounter (Signed)
-----   Message from Willia Craze, NP sent at 04/02/2022  1:32 PM EST ----- Hi,  Will you send in a refill on patient's xifaxan. He is in hospital and wife worried but he has no refills available. If you need pharmacy information you should be able to reach his wife. He takes 550 mg BID and needs 6 refills. Thanks

## 2022-04-02 NOTE — Progress Notes (Signed)
PROGRESS NOTE   Corey Wood  HMC:[id number] DOB: [birth year] DOA: 04/01/2022 PCP: [facility]  Brief Narrative:  72 year old white male NASH cirrhosis + varices status post multiple banding, complicated by portal hypertension and hepatic hydrothorax--status post TIPS procedure 03/26/2022 DM TY 2 HTN CKD 3 AA COPD reflux parkinsonism COPD/OSA Hospitalization 1022-01/20/2022 hematemesis-banded X4 transfused in addition--at that hospitalization developed A-fib RVR not anticoagulation candidate  Confusion over several days in the setting of probably not taking his lactulose Brought to GI office and referred to inpatient service Ammonia 99 BUN/creatinine 14/1.1 platelet 105 INR 1.2 CT chest ascending aortic aneurysm 4.1 moderate pleural effusions on the left decreased on right chronic left hydroureteronephrosis with small dependent stone CT head negative  Hospital-Problem based course  Acute hepatic encephalopathy likely secondary to recent TIPS procedure 03/26/2022 - GI to see but would titrate lactulose to have at least 3-5 loose stools a day - Resume rifaximin 550 twice daily, wife states pretty expensive and she is trying to get the [facility] to pay for this additionally - Secondary to encephalopathy would cut back dose of bupropion, also would hold Cymbalta trazodone and codeine at this time  [name] related ascites -Continue Aldactone 50 q. other day, Lasix 20 daily - Has hypotension related to cirrhosis and third spacing so continue midodrine 10 3 times daily  Parkinsonism - Continue Sinemet 1 tab 3 times daily  Prior gastric varices secondary to [name] cirrhosis -Continue Protonix 40 daily  DM TY 2 - Levemir 16 units on hold - CBGs ranging  CKD 3A without any acute decompensation - Follow trends of labs  DVT prophylaxis: SCD Code Status: Full Family Communication: Discussed planning at bedside with wife she understands this may take some time to resolve Disposition:   Status is: Observation The patient will require care spanning > 2 midnights and should be moved to inpatient because:   Has cirrhosis and will need several days to get improved    Consultants:  Gastroenterology  Procedures: non  Antimicrobials: n    Subjective: Awakens knows he is in hospital but is quite confused-he is tremulous He has no fever no chills His wife supplements the history greatly  Objective: Vitals:   04/01/22 2330 04/02/22 0026 04/02/22 0448 04/02/22 0500  BP: 117/70 135/74 138/81   Pulse: 64 81 85   Resp:  18 16   Temp:  98.8 F (37.1 C) (!) 97.3 F (36.3 C)   TempSrc:  Oral    SpO2: 96% 97% 96%   Weight:  70.1 kg  70.5 kg  Height:  '5\' 6"'$  (1.676 m)     No intake or output data in the 24 hours ending 04/02/22 0756 Filed Weights   04/02/22 0026 04/02/22 0500  Weight: 70.1 kg 70.5 kg    Examination:  Confused but pleasant white male no distress EOMI NCAT nonicteric S1-S2 slight tachycardia Abdomen is obese and slightly distended no rebound no guarding He is quite tremulous I am not able to do full neuroexam  Data Reviewed: personally reviewed   CBC    Component Value Date/Time   WBC 3.7 (L) 04/02/2022 0343   RBC 3.34 (L) 04/02/2022 0343   HGB 10.4 (L) 04/02/2022 0343   HCT 31.8 (L) 04/02/2022 0343   PLT 100 (L) 04/02/2022 0343   MCV 95.2 04/02/2022 0343   MCH 31.1 04/02/2022 0343   MCHC 32.7 04/02/2022 0343   RDW 16.9 (H) 04/02/2022 0343   LYMPHSABS 0.7 04/01/2022 1201   MONOABS 0.3 04/01/2022  1201   EOSABS 0.2 04/01/2022 1201   BASOSABS 0.1 04/01/2022 1201      Latest Ref Rng & Units 04/02/2022    3:43 AM 04/01/2022   12:01 PM 03/26/2022    9:29 AM  CMP  Glucose 70 - 99 mg/dL 137  149  153   BUN 8 - 23 mg/dL '16  14  12   '$ Creatinine 0.61 - 1.24 mg/dL 1.21  1.16  1.41   Sodium 135 - 145 mmol/L 138  137  134   Potassium 3.5 - 5.1 mmol/L 3.5  3.7  3.5   Chloride 98 - 111 mmol/L 102  100  103   CO2 22 - 32 mmol/L '27  27  24    '$ Calcium 8.9 - 10.3 mg/dL 9.2  9.4  8.8   Total Protein 6.5 - 8.1 g/dL 7.4  8.4  7.9   Total Bilirubin 0.3 - 1.2 mg/dL 1.2  1.3  0.7   Alkaline Phos 38 - 126 U/L 102  129  114   AST 15 - 41 U/L 32  35  31   ALT 0 - 44 U/L '16  7  7      '$ Radiology Studies: CT CHEST ABDOMEN PELVIS W CONTRAST  Result Date: 04/01/2022 CLINICAL DATA:  Status post TIPS creation on 03/26/2022 with increasing confusion and abdominal distention EXAM: CT CHEST, ABDOMEN, AND PELVIS WITH CONTRAST TECHNIQUE: Multidetector CT imaging of the chest, abdomen and pelvis was performed following the standard protocol during bolus administration of intravenous contrast. RADIATION DOSE REDUCTION: This exam was performed according to the departmental dose-optimization program which includes automated exposure control, adjustment of the mA and/or kV according to patient size and/or use of iterative reconstruction technique. CONTRAST:  60m OMNIPAQUE IOHEXOL 300 MG/ML  SOLN COMPARISON:  CTA abdomen and pelvis dated 02/09/2022, CT chest dated 05/06/2021 FINDINGS: CT CHEST FINDINGS Cardiovascular: Normal heart size. No significant pericardial fluid/thickening. Coronary artery calcifications. Ascending aorta measures 4.1 cm. No central pulmonary emboli. Mediastinum/Nodes: Imaged thyroid gland without nodules meeting criteria for imaging follow-up by size. Normal esophagus. No pathologically enlarged axillary, supraclavicular, mediastinal, or hilar lymph nodes. Lungs/Pleura: The central airways are patent. Persistent near-complete collapse of bilateral lower lobes. Mild interlobular septal thickening, left-greater-than-right. No pneumothorax. Similar moderate left pleural effusion and decreased small right pleural effusion. Musculoskeletal: No acute or abnormal lytic or blastic osseous lesions. CT ABDOMEN PELVIS FINDINGS Hepatobiliary: No focal hepatic lesions. No intra or extrahepatic biliary ductal dilation. Cholecystectomy. Pancreas: No focal  lesions or main ductal dilation. Spleen: Unchanged splenomegaly measures 17.0 cm. Adrenals/Urinary Tract: No adrenal nodules. Normal appearance of the right kidney without suspicious renal mass, calculi, or hydronephrosis. Chronic left hydroureteronephrosis with atrophic parenchyma. Similar dependent 2 mm stone in the distal left ureter. No focal bladder wall thickening. Stomach/Bowel: Normal appearance of the stomach. Duodenal diverticulum in the fourth portion. No evidence of bowel wall thickening, distention, or inflammatory changes. Normal appendix. Vascular/Lymphatic: Interval TIPS creation, which appears patent. Aortic atherosclerosis. No enlarged abdominal or pelvic lymph nodes. Reproductive: Prostate is unremarkable. Other: No free fluid, fluid collection, or free air. Musculoskeletal: No acute or abnormal lytic or blastic osseous findings. Unchanged T12 compression deformity. IMPRESSION: CHEST: 1. Similar moderate left pleural effusion and decreased small right pleural effusion with persistent near-complete collapse of bilateral lower lobes. 2. Mild interlobular septal thickening, suggestive of mild pulmonary edema. 3. Ascending aortic aneurysm measures 4.1 cm. Recommend annual imaging followup by CTA or MRA. This recommendation follows 2010 ACCF/AHA/AATS/ACR/ASA/SCA/SCAI/SIR/STS/SVM Guidelines for  the Diagnosis and Management of Patients with Thoracic Aortic Disease. Circulation. 2010; 121: P943-E761. Aortic aneurysm NOS (ICD10-I71.9) ABDOMEN/PELVIS: 1. No acute abnormality in the abdomen or pelvis. 2. Interval TIPS creation, which appears patent. 3. Chronic left hydroureteronephrosis with atrophic parenchyma. Similar dependent 2 mm stone in the distal left ureter. 4. Unchanged splenomegaly. 5. Aortic Atherosclerosis (ICD10-I70.0). Electronically Signed   By: Darrin Nipper M.D.   On: 04/01/2022 15:28   CT Head Wo Contrast  Result Date: 04/01/2022 CLINICAL DATA:  Delirium.  Confusion. EXAM: CT HEAD WITHOUT  CONTRAST TECHNIQUE: Contiguous axial images were obtained from the base of the skull through the vertex without intravenous contrast. RADIATION DOSE REDUCTION: This exam was performed according to the departmental dose-optimization program which includes automated exposure control, adjustment of the mA and/or kV according to patient size and/or use of iterative reconstruction technique. COMPARISON:  CT Head 08/16/21 FINDINGS: Brain: No evidence of acute infarction, hemorrhage, hydrocephalus, extra-axial collection or mass lesion/mass effect. Mineralization of the basal ganglia bilaterally. Sequela of moderate chronic microvascular ischemic change. Vascular: No hyperdense vessel or unexpected calcification. Unchanged arachnoid granulation in the superior sagittal sinus. Skull: Normal. Negative for fracture or focal lesion. Sinuses/Orbits: Bilateral lens replacement. No mastoid or middle ear effusion. Paranasal sinuses appear clear. Other: None. IMPRESSION: 1. No acute intracranial process. 2. Sequela of moderate chronic microvascular ischemic change. Electronically Signed   By: Marin Roberts M.D.   On: 04/01/2022 15:01     Scheduled Meds:  buPROPion  150 mg Oral Daily   carbidopa-levodopa  1 tablet Oral TID   furosemide  20 mg Oral Daily   insulin aspart  0-9 Units Subcutaneous TID WC   lactulose  30 g Oral TID   midodrine  10 mg Oral TID with meals   montelukast  10 mg Oral Daily   pantoprazole  40 mg Oral Q breakfast   rifaximin  550 mg Oral BID   spironolactone  50 mg Oral QODAY   tamsulosin  0.4 mg Oral BID   Continuous Infusions:   LOS: 0 days   Time spent: [place], MD [facility] To contact the attending provider between 7A-7P or the covering provider during after hours 7P-7A, please log into the web site [email or web address] and access using universal [place] password for that web site. If you do not have the password, please call the hospital operator.  04/02/2022,  7:56 AM

## 2022-04-02 NOTE — Telephone Encounter (Signed)
Called and spoke with patient's wife Corey Wood. She would like RX sent to [facility]. Corey Wood was very thankful for all of the help that she has received by Dr. Havery Moros and clinical staff. Corey Wood is very appreciative and she had no concerns at this time.

## 2022-04-02 NOTE — Plan of Care (Signed)
Plan of care initiated and discussed. 

## 2022-04-03 ENCOUNTER — Other Ambulatory Visit: Payer: Self-pay | Admitting: Gastroenterology

## 2022-04-03 ENCOUNTER — Inpatient Hospital Stay (HOSPITAL_COMMUNITY): Payer: Non-veteran care

## 2022-04-03 DIAGNOSIS — N289 Disorder of kidney and ureter, unspecified: Secondary | ICD-10-CM | POA: Diagnosis not present

## 2022-04-03 DIAGNOSIS — K7460 Unspecified cirrhosis of liver: Secondary | ICD-10-CM

## 2022-04-03 DIAGNOSIS — K7682 Hepatic encephalopathy: Secondary | ICD-10-CM | POA: Diagnosis not present

## 2022-04-03 DIAGNOSIS — N189 Chronic kidney disease, unspecified: Secondary | ICD-10-CM

## 2022-04-03 DIAGNOSIS — K7290 Hepatic failure, unspecified without coma: Secondary | ICD-10-CM | POA: Diagnosis not present

## 2022-04-03 LAB — CBC
HCT: 37 % — ABNORMAL LOW (ref 39.0–52.0)
Hemoglobin: 12.4 g/dL — ABNORMAL LOW (ref 13.0–17.0)
MCH: 31.1 pg (ref 26.0–34.0)
MCHC: 33.5 g/dL (ref 30.0–36.0)
MCV: 92.7 fL (ref 80.0–100.0)
Platelets: 96 10*3/uL — ABNORMAL LOW (ref 150–400)
RBC: 3.99 MIL/uL — ABNORMAL LOW (ref 4.22–5.81)
RDW: 16.9 % — ABNORMAL HIGH (ref 11.5–15.5)
WBC: 7.8 10*3/uL (ref 4.0–10.5)
nRBC: 0 % (ref 0.0–0.2)

## 2022-04-03 LAB — COMPREHENSIVE METABOLIC PANEL
ALT: 23 U/L (ref 0–44)
AST: 63 U/L — ABNORMAL HIGH (ref 15–41)
Albumin: 3.1 g/dL — ABNORMAL LOW (ref 3.5–5.0)
Alkaline Phosphatase: 124 U/L (ref 38–126)
Anion gap: 13 (ref 5–15)
BUN: 21 mg/dL (ref 8–23)
CO2: 22 mmol/L (ref 22–32)
Calcium: 10.2 mg/dL (ref 8.9–10.3)
Chloride: 102 mmol/L (ref 98–111)
Creatinine, Ser: 1.45 mg/dL — ABNORMAL HIGH (ref 0.61–1.24)
GFR, Estimated: 51 mL/min — ABNORMAL LOW (ref >60)
Glucose, Bld: 205 mg/dL — ABNORMAL HIGH (ref 70–99)
Potassium: 3.9 mmol/L (ref 3.5–5.1)
Sodium: 137 mmol/L (ref 135–145)
Total Bilirubin: 2 mg/dL — ABNORMAL HIGH (ref 0.3–1.2)
Total Protein: 8.6 g/dL — ABNORMAL HIGH (ref 6.5–8.1)

## 2022-04-03 LAB — GLUCOSE, CAPILLARY
Glucose-Capillary: 182 mg/dL — ABNORMAL HIGH (ref 70–99)
Glucose-Capillary: 234 mg/dL — ABNORMAL HIGH (ref 70–99)
Glucose-Capillary: 290 mg/dL — ABNORMAL HIGH (ref 70–99)
Glucose-Capillary: 148 mg/dL — ABNORMAL HIGH (ref 70–99)

## 2022-04-03 MED ORDER — ZINC SULFATE 220 (50 ZN) MG PO CAPS
220.0000 mg | ORAL_CAPSULE | Freq: Two times a day (BID) | ORAL | Status: DC
  Administered 2022-04-03 – 2022-04-10 (×15): 220 mg via ORAL
  Filled 2022-04-03 (×15): qty 1

## 2022-04-03 MED ORDER — LACTULOSE 10 GM/15ML PO SOLN
30.0000 g | Freq: Four times a day (QID) | ORAL | Status: DC
  Administered 2022-04-03 – 2022-04-10 (×27): 30 g via ORAL
  Filled 2022-04-03 (×27): qty 45

## 2022-04-03 MED ORDER — ENSURE ENLIVE PO LIQD
237.0000 mL | Freq: Two times a day (BID) | ORAL | Status: DC
  Administered 2022-04-03 – 2022-04-07 (×8): 237 mL via ORAL

## 2022-04-03 MED ORDER — RIFAXIMIN 550 MG PO TABS: 550.0000 mg | ORAL_TABLET | Freq: Two times a day (BID) | ORAL | 6 refills | Status: DC

## 2022-04-03 NOTE — Progress Notes (Signed)
Patient with 02 SATS 91%, oncall provider notified, 02 2L applied

## 2022-04-03 NOTE — Progress Notes (Addendum)
Gastroenterology Inpatient Follow-up Note   PATIENT IDENTIFICATION  Corey Wood is a 72 y.o. male Hospital Day: 3  SUBJECTIVE  The patient's labs have been reviewed. The patient's chart has been reviewed. The patient's friend is staying with him today, his wife went home but should be coming back to the hospital later this morning/afternoon. The patient is tolerating oral lactulose and having bowel movements but is not being interactive. He is not eating much per report but has been tolerating liquids and juices. I was able to send in a prescription to the [place] for [name] yesterday.   OBJECTIVE  Scheduled Inpatient Medications:   carbidopa-levodopa  1 tablet Oral TID   feeding supplement  237 mL Oral BID BM   insulin aspart  0-9 Units Subcutaneous TID WC   lactulose  30 g Oral QID   midodrine  10 mg Oral TID with meals   montelukast  10 mg Oral Daily   pantoprazole  40 mg Oral Q breakfast   rifaximin  550 mg Oral BID   spironolactone  50 mg Oral QODAY   tamsulosin  0.4 mg Oral BID   Continuous Inpatient Infusions:  PRN Inpatient Medications: albuterol, ondansetron **OR** ondansetron (ZOFRAN) IV   Physical Examination  Temp:  [97.9 F (36.6 C)-98.9 F (37.2 C)] 98.3 F (36.8 C) (01/06 0502) Pulse Rate:  [102-107] 107 (01/06 0502) Resp:  [16-18] 16 (01/06 0502) BP: (124-158)/(72-80) 158/72 (01/06 0502) SpO2:  [94 %-96 %] 96 % (01/06 0502) Weight:  [70.6 kg] 70.6 kg (01/06 0500) Temp (24hrs), Avg:98.4 F (36.9 C), Min:97.9 F (36.6 C), Max:98.9 F (37.2 C)  Weight: 70.6 kg GEN: Chronically ill-appearing PSYCH: Patient is awake but not cooperative for examination EYE: Nonicteric sclerae ENT: Dry MM CV: Nontachycardic RESP: No audible wheezing GI: NABS, soft, protuberant abdomen, rounded, without rebound or guarding MSK/EXT: Trace bilateral pedal edema SKIN: Spider angiomata noted on upper thorax, no jaundice NEURO: Unable to assess asterixis but he  has clonus in the upper and lower extremities  Review of Data   Laboratory Studies   Recent Labs  Lab 04/03/22 0410  NA 137  K 3.9  CL 102  CO2 22  BUN 21  CREATININE 1.45*  GLUCOSE 205*  CALCIUM 10.2   Recent Labs  Lab 04/03/22 0410  AST 63*  ALT 23  ALKPHOS 124    Recent Labs  Lab 04/01/22 1201 04/02/22 0343 04/03/22 0410  WBC 4.6 3.7* 7.8  HGB 11.5* 10.4* 12.4*  HCT 35.1* 31.8* 37.0*  PLT 105* 100* 96*   Recent Labs  Lab 04/01/22 1900 04/02/22 0343  INR 1.2 1.3*   MELD 3.0: 16 at 04/03/2022  4:10 AM MELD-Na: 16 at 04/03/2022  4:10 AM Calculated from: Serum Creatinine: 1.45 mg/dL at 04/03/2022  4:10 AM Serum Sodium: 137 mmol/L at 04/03/2022  4:10 AM Total Bilirubin: 2.0 mg/dL at 04/03/2022  4:10 AM Serum Albumin: 3.1 g/dL at 04/03/2022  4:10 AM INR(ratio): 1.3 at 04/02/2022  3:43 AM Age at listing (hypothetical): 23 years Sex: Male at 04/03/2022  4:10 AM   Imaging Studies  CT CHEST ABDOMEN PELVIS W CONTRAST  Result Date: 04/01/2022 CLINICAL DATA:  Status post TIPS creation on 03/26/2022 with increasing confusion and abdominal distention EXAM: CT CHEST, ABDOMEN, AND PELVIS WITH CONTRAST TECHNIQUE: Multidetector CT imaging of the chest, abdomen and pelvis was performed following the standard protocol during bolus administration of intravenous contrast. RADIATION DOSE REDUCTION: This exam was performed according to the departmental dose-optimization  program which includes automated exposure control, adjustment of the mA and/or kV according to patient size and/or use of iterative reconstruction technique. CONTRAST:  10m OMNIPAQUE IOHEXOL 300 MG/ML  SOLN COMPARISON:  CTA abdomen and pelvis dated 02/09/2022, CT chest dated 05/06/2021 FINDINGS: CT CHEST FINDINGS Cardiovascular: Normal heart size. No significant pericardial fluid/thickening. Coronary artery calcifications. Ascending aorta measures 4.1 cm. No central pulmonary emboli. Mediastinum/Nodes: Imaged thyroid gland without  nodules meeting criteria for imaging follow-up by size. Normal esophagus. No pathologically enlarged axillary, supraclavicular, mediastinal, or hilar lymph nodes. Lungs/Pleura: The central airways are patent. Persistent near-complete collapse of bilateral lower lobes. Mild interlobular septal thickening, left-greater-than-right. No pneumothorax. Similar moderate left pleural effusion and decreased small right pleural effusion. Musculoskeletal: No acute or abnormal lytic or blastic osseous lesions. CT ABDOMEN PELVIS FINDINGS Hepatobiliary: No focal hepatic lesions. No intra or extrahepatic biliary ductal dilation. Cholecystectomy. Pancreas: No focal lesions or main ductal dilation. Spleen: Unchanged splenomegaly measures 17.0 cm. Adrenals/Urinary Tract: No adrenal nodules. Normal appearance of the right kidney without suspicious renal mass, calculi, or hydronephrosis. Chronic left hydroureteronephrosis with atrophic parenchyma. Similar dependent 2 mm stone in the distal left ureter. No focal bladder wall thickening. Stomach/Bowel: Normal appearance of the stomach. Duodenal diverticulum in the fourth portion. No evidence of bowel wall thickening, distention, or inflammatory changes. Normal appendix. Vascular/Lymphatic: Interval TIPS creation, which appears patent. Aortic atherosclerosis. No enlarged abdominal or pelvic lymph nodes. Reproductive: Prostate is unremarkable. Other: No free fluid, fluid collection, or free air. Musculoskeletal: No acute or abnormal lytic or blastic osseous findings. Unchanged T12 compression deformity. IMPRESSION: CHEST: 1. Similar moderate left pleural effusion and decreased small right pleural effusion with persistent near-complete collapse of bilateral lower lobes. 2. Mild interlobular septal thickening, suggestive of mild pulmonary edema. 3. Ascending aortic aneurysm measures 4.1 cm. Recommend annual imaging followup by CTA or MRA. This recommendation follows 2010  ACCF/AHA/AATS/ACR/ASA/SCA/SCAI/SIR/STS/SVM Guidelines for the Diagnosis and Management of Patients with Thoracic Aortic Disease. Circulation. 2010; 121:: T267-T245 Aortic aneurysm NOS (ICD10-I71.9) ABDOMEN/PELVIS: 1. No acute abnormality in the abdomen or pelvis. 2. Interval TIPS creation, which appears patent. 3. Chronic left hydroureteronephrosis with atrophic parenchyma. Similar dependent 2 mm stone in the distal left ureter. 4. Unchanged splenomegaly. 5. Aortic Atherosclerosis (ICD10-I70.0). Electronically Signed   By: LDarrin NipperM.D.   On: 04/01/2022 15:28   CT Head Wo Contrast  Result Date: 04/01/2022 CLINICAL DATA:  Delirium.  Confusion. EXAM: CT HEAD WITHOUT CONTRAST TECHNIQUE: Contiguous axial images were obtained from the base of the skull through the vertex without intravenous contrast. RADIATION DOSE REDUCTION: This exam was performed according to the departmental dose-optimization program which includes automated exposure control, adjustment of the mA and/or kV according to patient size and/or use of iterative reconstruction technique. COMPARISON:  CT Head 08/16/21 FINDINGS: Brain: No evidence of acute infarction, hemorrhage, hydrocephalus, extra-axial collection or mass lesion/mass effect. Mineralization of the basal ganglia bilaterally. Sequela of moderate chronic microvascular ischemic change. Vascular: No hyperdense vessel or unexpected calcification. Unchanged arachnoid granulation in the superior sagittal sinus. Skull: Normal. Negative for fracture or focal lesion. Sinuses/Orbits: Bilateral lens replacement. No mastoid or middle ear effusion. Paranasal sinuses appear clear. Other: None. IMPRESSION: 1. No acute intracranial process. 2. Sequela of moderate chronic microvascular ischemic change. Electronically Signed   By: HMarin RobertsM.D.   On: 04/01/2022 15:01    GI Procedures and Studies  No new studies to review   ASSESSMENT  Mr. BCancrois a 72y.o. 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  Patient admitted with failure to thrive, progressive altered mental status and decompensated encephalopathy.  Patient is hemodynamically stable but from a clinical standpoint is not making much progress in regards to his overall mentation.  We are going to be aggressive with his lactulose and only hold lactulose if he has had more than 4 bowel movements daily.  We will recheck his BMP this afternoon and if he has evidence of any electrolyte abnormalities our medical service can work on repeating those.  I am going to add zinc to his medication regimen and we will continue his Xifaxan.  No evidence of any infection has been found at this point in time.  Urinalysis was able to be performed yesterday and does not show evidence of overt infection.  The patient does have a left-sided pleural effusion that has been present for a while.  He is not showing any evidence of new dyspnea or shortness of breath but could this be a potential source or nidus for infection, possibly but felt less likely since he is not having any other symptoms and no white count.  I do not think we need to tap this at this time.  If we are continuing to be aggressive with his lactulose and Xifaxan and with his in condition and he is not making progress, then he will need strong consideration for interventional radiologist for TIPS stent downsize.  The patient's friend and wife from yesterday are aware that if he is not having appropriate bowel movements he will need NG tube placement for aggressive therapy but because he still able to take fluid and pills we will try to see where things go.  He may need a NG tube GIST for supplemental nutrition if he is not taking in enough in his current status also.  Lets reevaluate the need for potential NG  tube tomorrow if nutrition is remaining a problem and he has not awakening from this episode of portosystemic encephalopathy.  He is in critical/guarded status overall.   PLAN/RECOMMENDATIONS  Continue lactulose 3-4 times daily - Hold doses only if patient has already had 3-4 bowel movements up to that point Continue Xifaxan twice daily Start zinc 220 mg twice daily Okay to continue every other day spironolactone 50 mg but if kidney function is stable may be able to consider restart of Lasix as well tomorrow No evidence of palpable ascites so no need for SBP prophylaxis Adding protein shakes 2-3 times daily that can be used If patient is not having adequate calorie intake by Sunday, strongly consider Dobbhoff placement for tube feeds supplementation If patient still has evidence of altered mental status not improving with aggressive portosystemic encephalopathy treatment, then we will have to ask our interventional radiology colleagues about consideration of TIPS downsizing so we will see where things stand on Sunday If patient has continued clinical decompensation, further goals of care discussion will need to be had as he has had a significant downturn since his TIPS procedure May need to consider goals of care overall in the coming days if things do not reverse   Please page/call with questions or concerns.   Justice Britain, MD [facility] Office # [phone]    LOS: 1 day  [facility]  04/03/2022, 11:38 AM

## 2022-04-03 NOTE — Progress Notes (Addendum)
PROGRESS NOTE   Corey Wood  NWG:[id number] DOB: 03/17/1951 DOA: 04/01/2022 PCP: [facility]  Brief Narrative:  72 year old white male NASH cirrhosis + varices status post multiple banding, complicated by portal hypertension and hepatic hydrothorax--status post TIPS procedure 03/26/2022 DM TY 2 HTN CKD 3 AA COPD reflux parkinsonism COPD/OSA Hospitalization 1022-01/20/2022 hematemesis-banded X4 transfused in addition--at that hospitalization developed A-fib RVR not anticoagulation candidate  Confusion over several days in the setting of probably not taking his lactulose Brought to GI office and referred to inpatient service Ammonia 99 BUN/creatinine 14/1.1 platelet 105 INR 1.2 CT chest ascending aortic aneurysm 4.1 moderate pleural effusions on the left decreased on right chronic left hydroureteronephrosis with small dependent stone CT head negative  Hospital-Problem based course  Acute hepatic encephalopathy likely secondary to recent TIPS procedure 03/26/2022 - GI to see but would continue to titrate lactulose to have at least 3-5 loose stools a day - Resume rifaximin 550 twice daily, discontinue all - Would discontinue all medications that have potential for confusion until patient is more for  [name] related ascites -Continue Aldactone 50 q. other day, Lasix has been held on 1/6 We will consider resumption in the next several days - Has hypotension related to cirrhosis and third spacing so continue midodrine 10 3 times daily  Pleural effusions -He is not on oxygen and I do not think that these are significant - I will get an x-ray for comparison sake  Parkinsonism - Continue Sinemet 1 tab 3 times daily  Prior gastric varices secondary to [name] cirrhosis -Continue Protonix 40 daily  DM TY 2 - Levemir 16 units on hold - CBGs ranging 180-200 range -Sliding scale alone at this time  CKD 3A without any acute decompensation - Follow trends of labs  Ascending  aortic dilatation 4.1 cm - Needs outpatient follow-up  DVT prophylaxis: SCD Code Status: DNR Family Communication: Discussed with wife and son at bedside Disposition:  Status is: Observation The patient will require care spanning > 2 midnights and should be moved to inpatient because:   Has cirrhosis and will need several days to get improved    Consultants:  Gastroenterology  Procedures: non  Antimicrobials: n    Subjective:  Marginal improvement only slightly more awake than yesterday although ate more today than he has Still not completely coherent  Objective: Vitals:   04/02/22 1315 04/02/22 2106 04/03/22 0500 04/03/22 0502  BP: 135/80 124/77  (!) 158/72  Pulse: (!) 102 (!) 107  (!) 107  Resp: '16 18  16  '$ Temp: 98.9 F (37.2 C) 97.9 F (36.6 C)  98.3 F (36.8 C)  TempSrc: Oral Axillary  Axillary  SpO2: 95% 94%  96%  Weight:   70.6 kg   Height:        Intake/Output Summary (Last 24 hours) at 04/03/2022 1027 Last data filed at 04/02/2022 2100 Gross per 24 hour  Intake 240 ml  Output 400 ml  Net -160 ml   Filed Weights   04/02/22 0026 04/02/22 0500 04/03/22 0500  Weight: 70.1 kg 70.5 kg 70.6 kg    Examination:  Confused tremulous Not making much sense Eyes are closed throughout exam, he has some spasming of the face upper extremities and is hyperreflexic Abdomen is obese nontender no rebound Chest is clear no wheeze rales rhonchi   Data Reviewed: personally reviewed   CBC    Component Value Date/Time   WBC 7.8 04/03/2022 0410   RBC 3.99 (L) 04/03/2022 0410   HGB  12.4 (L) 04/03/2022 0410   HCT 37.0 (L) 04/03/2022 0410   PLT 96 (L) 04/03/2022 0410   MCV 92.7 04/03/2022 0410   MCH 31.1 04/03/2022 0410   MCHC 33.5 04/03/2022 0410   RDW 16.9 (H) 04/03/2022 0410   LYMPHSABS 0.7 04/01/2022 1201   MONOABS 0.3 04/01/2022 1201   EOSABS 0.2 04/01/2022 1201   BASOSABS 0.1 04/01/2022 1201      Latest Ref Rng & Units 04/03/2022    4:10 AM 04/02/2022     3:43 AM 04/01/2022   12:01 PM  CMP  Glucose 70 - 99 mg/dL 205  137  149   BUN 8 - 23 mg/dL '21  16  14   '$ Creatinine 0.61 - 1.24 mg/dL 1.45  1.21  1.16   Sodium 135 - 145 mmol/L 137  138  137   Potassium 3.5 - 5.1 mmol/L 3.9  3.5  3.7   Chloride 98 - 111 mmol/L 102  102  100   CO2 22 - 32 mmol/L '22  27  27   '$ Calcium 8.9 - 10.3 mg/dL 10.2  9.2  9.4   Total Protein 6.5 - 8.1 g/dL 8.6  7.4  8.4   Total Bilirubin 0.3 - 1.2 mg/dL 2.0  1.2  1.3   Alkaline Phos 38 - 126 U/L 124  102  129   AST 15 - 41 U/L 63  32  35   ALT 0 - 44 U/L '23  16  7      '$ Radiology Studies: CT CHEST ABDOMEN PELVIS W CONTRAST  Result Date: 04/01/2022 CLINICAL DATA:  Status post TIPS creation on 03/26/2022 with increasing confusion and abdominal distention EXAM: CT CHEST, ABDOMEN, AND PELVIS WITH CONTRAST TECHNIQUE: Multidetector CT imaging of the chest, abdomen and pelvis was performed following the standard protocol during bolus administration of intravenous contrast. RADIATION DOSE REDUCTION: This exam was performed according to the departmental dose-optimization program which includes automated exposure control, adjustment of the mA and/or kV according to patient size and/or use of iterative reconstruction technique. CONTRAST:  46m OMNIPAQUE IOHEXOL 300 MG/ML  SOLN COMPARISON:  CTA abdomen and pelvis dated 02/09/2022, CT chest dated 05/06/2021 FINDINGS: CT CHEST FINDINGS Cardiovascular: Normal heart size. No significant pericardial fluid/thickening. Coronary artery calcifications. Ascending aorta measures 4.1 cm. No central pulmonary emboli. Mediastinum/Nodes: Imaged thyroid gland without nodules meeting criteria for imaging follow-up by size. Normal esophagus. No pathologically enlarged axillary, supraclavicular, mediastinal, or hilar lymph nodes. Lungs/Pleura: The central airways are patent. Persistent near-complete collapse of bilateral lower lobes. Mild interlobular septal thickening, left-greater-than-right. No  pneumothorax. Similar moderate left pleural effusion and decreased small right pleural effusion. Musculoskeletal: No acute or abnormal lytic or blastic osseous lesions. CT ABDOMEN PELVIS FINDINGS Hepatobiliary: No focal hepatic lesions. No intra or extrahepatic biliary ductal dilation. Cholecystectomy. Pancreas: No focal lesions or main ductal dilation. Spleen: Unchanged splenomegaly measures 17.0 cm. Adrenals/Urinary Tract: No adrenal nodules. Normal appearance of the right kidney without suspicious renal mass, calculi, or hydronephrosis. Chronic left hydroureteronephrosis with atrophic parenchyma. Similar dependent 2 mm stone in the distal left ureter. No focal bladder wall thickening. Stomach/Bowel: Normal appearance of the stomach. Duodenal diverticulum in the fourth portion. No evidence of bowel wall thickening, distention, or inflammatory changes. Normal appendix. Vascular/Lymphatic: Interval TIPS creation, which appears patent. Aortic atherosclerosis. No enlarged abdominal or pelvic lymph nodes. Reproductive: Prostate is unremarkable. Other: No free fluid, fluid collection, or free air. Musculoskeletal: No acute or abnormal lytic or blastic osseous findings. Unchanged T12 compression deformity.  IMPRESSION: CHEST: 1. Similar moderate left pleural effusion and decreased small right pleural effusion with persistent near-complete collapse of bilateral lower lobes. 2. Mild interlobular septal thickening, suggestive of mild pulmonary edema. 3. Ascending aortic aneurysm measures 4.1 cm. Recommend annual imaging followup by CTA or MRA. This recommendation follows 2010 ACCF/AHA/AATS/ACR/ASA/SCA/SCAI/SIR/STS/SVM Guidelines for the Diagnosis and Management of Patients with Thoracic Aortic Disease. Circulation. 2010; 121: W979-G921. Aortic aneurysm NOS (ICD10-I71.9) ABDOMEN/PELVIS: 1. No acute abnormality in the abdomen or pelvis. 2. Interval TIPS creation, which appears patent. 3. Chronic left hydroureteronephrosis with  atrophic parenchyma. Similar dependent 2 mm stone in the distal left ureter. 4. Unchanged splenomegaly. 5. Aortic Atherosclerosis (ICD10-I70.0). Electronically Signed   By: Darrin Nipper M.D.   On: 04/01/2022 15:28   CT Head Wo Contrast  Result Date: 04/01/2022 CLINICAL DATA:  Delirium.  Confusion. EXAM: CT HEAD WITHOUT CONTRAST TECHNIQUE: Contiguous axial images were obtained from the base of the skull through the vertex without intravenous contrast. RADIATION DOSE REDUCTION: This exam was performed according to the departmental dose-optimization program which includes automated exposure control, adjustment of the mA and/or kV according to patient size and/or use of iterative reconstruction technique. COMPARISON:  CT Head 08/16/21 FINDINGS: Brain: No evidence of acute infarction, hemorrhage, hydrocephalus, extra-axial collection or mass lesion/mass effect. Mineralization of the basal ganglia bilaterally. Sequela of moderate chronic microvascular ischemic change. Vascular: No hyperdense vessel or unexpected calcification. Unchanged arachnoid granulation in the superior sagittal sinus. Skull: Normal. Negative for fracture or focal lesion. Sinuses/Orbits: Bilateral lens replacement. No mastoid or middle ear effusion. Paranasal sinuses appear clear. Other: None. IMPRESSION: 1. No acute intracranial process. 2. Sequela of moderate chronic microvascular ischemic change. Electronically Signed   By: Marin Roberts M.D.   On: 04/01/2022 15:01     Scheduled Meds:  buPROPion  75 mg Oral Daily   carbidopa-levodopa  1 tablet Oral TID   feeding supplement  237 mL Oral BID BM   insulin aspart  0-9 Units Subcutaneous TID WC   lactulose  30 g Oral QID   midodrine  10 mg Oral TID with meals   montelukast  10 mg Oral Daily   pantoprazole  40 mg Oral Q breakfast   rifaximin  550 mg Oral BID   spironolactone  50 mg Oral QODAY   tamsulosin  0.4 mg Oral BID   Continuous Infusions:   LOS: 1 day   Time spent:  [place], MD [facility] To contact the attending provider between 7A-7P or the covering provider during after hours 7P-7A, please log into the web site [email or web address] and access using universal [place] password for that web site. If you do not have the password, please call the hospital operator.  04/03/2022, 10:27 AM

## 2022-04-03 NOTE — Plan of Care (Signed)
  Problem: Nutritional: Goal: Maintenance of adequate nutrition will improve Outcome: Not Progressing

## 2022-04-03 NOTE — Plan of Care (Signed)
  Problem: Education: Goal: Understanding of CV disease, CV risk reduction, and recovery process will improve Outcome: Progressing   Problem: Education: Goal: Ability to describe self-care measures that may prevent or decrease complications (Diabetes Survival Skills Education) will improve Outcome: Progressing   Problem: Fluid Volume: Goal: Ability to maintain a balanced intake and output will improve Outcome: Progressing

## 2022-04-04 ENCOUNTER — Inpatient Hospital Stay (HOSPITAL_COMMUNITY): Payer: Non-veteran care

## 2022-04-04 DIAGNOSIS — K7682 Hepatic encephalopathy: Secondary | ICD-10-CM | POA: Diagnosis not present

## 2022-04-04 LAB — COMPREHENSIVE METABOLIC PANEL
ALT: 18 U/L (ref 0–44)
AST: 75 U/L — ABNORMAL HIGH (ref 15–41)
Albumin: 2.9 g/dL — ABNORMAL LOW (ref 3.5–5.0)
Alkaline Phosphatase: 121 U/L (ref 38–126)
Anion gap: 10 (ref 5–15)
BUN: 20 mg/dL (ref 8–23)
CO2: 26 mmol/L (ref 22–32)
Calcium: 10.2 mg/dL (ref 8.9–10.3)
Chloride: 99 mmol/L (ref 98–111)
Creatinine, Ser: 1.4 mg/dL — ABNORMAL HIGH (ref 0.61–1.24)
GFR, Estimated: 53 mL/min — ABNORMAL LOW (ref >60)
Glucose, Bld: 165 mg/dL — ABNORMAL HIGH (ref 70–99)
Potassium: 3.4 mmol/L — ABNORMAL LOW (ref 3.5–5.1)
Sodium: 135 mmol/L (ref 135–145)
Total Bilirubin: 2.3 mg/dL — ABNORMAL HIGH (ref 0.3–1.2)
Total Protein: 8.6 g/dL — ABNORMAL HIGH (ref 6.5–8.1)

## 2022-04-04 LAB — GLUCOSE, CAPILLARY
Glucose-Capillary: 167 mg/dL — ABNORMAL HIGH (ref 70–99)
Glucose-Capillary: 185 mg/dL — ABNORMAL HIGH (ref 70–99)
Glucose-Capillary: 197 mg/dL — ABNORMAL HIGH (ref 70–99)
Glucose-Capillary: 251 mg/dL — ABNORMAL HIGH (ref 70–99)

## 2022-04-04 LAB — CBC
HCT: 36.1 % — ABNORMAL LOW (ref 39.0–52.0)
Hemoglobin: 12.1 g/dL — ABNORMAL LOW (ref 13.0–17.0)
MCH: 31.3 pg (ref 26.0–34.0)
MCHC: 33.5 g/dL (ref 30.0–36.0)
MCV: 93.3 fL (ref 80.0–100.0)
Platelets: 116 10*3/uL — ABNORMAL LOW (ref 150–400)
RBC: 3.87 MIL/uL — ABNORMAL LOW (ref 4.22–5.81)
RDW: 17.1 % — ABNORMAL HIGH (ref 11.5–15.5)
WBC: 9.2 10*3/uL (ref 4.0–10.5)
nRBC: 0 % (ref 0.0–0.2)

## 2022-04-04 LAB — PROTIME-INR
INR: 1.4 — ABNORMAL HIGH (ref 0.8–1.2)
Prothrombin Time: 17.3 seconds — ABNORMAL HIGH (ref 11.4–15.2)

## 2022-04-04 MED ORDER — LACTULOSE ENEMA
300.0000 mL | Freq: Three times a day (TID) | ORAL | Status: AC
  Administered 2022-04-04 (×2): 300 mL via RECTAL
  Filled 2022-04-04 (×2): qty 300

## 2022-04-04 NOTE — Progress Notes (Addendum)
Progress Note  Primary GI: Dr. Havery Moros   Subjective  Chief Complaint:status post recent TIPS at the end of December. Patient admitted with failure to thrive, progressive altered mental status and decompensated encephalopathy.   Corey Wood is family friend that is here from this AM, wife Corey Wood is at home sleeping.  Corey Wood feels he is doing better with some response to the granddaughter.  She was able to feed him biscuits and gravy, and applesauce.  No BM in flex seal since at least 11 AM. Has had 2 doses of lactulose 30 ml today.  Passing less gas per friend.  No bleeding.     Objective   Vital signs in last 24 hours: Temp:  [98.6 F (37 C)-99 F (37.2 C)] 98.6 F (37 C) (01/07 0602) Pulse Rate:  [59-99] 98 (01/07 0602) Resp:  [18] 18 (01/07 0602) BP: (125-137)/(88-96) 137/96 (01/07 0602) SpO2:  [91 %-99 %] 98 % (01/07 0602) Last BM Date : 04/04/22 Last BM recorded by nurses in past 5 days Stool Type: Type 7 (Liquid consistency with no solid pieces) (04/04/2022  8:10 AM)  GEN: Chronically ill-appearing PSYCH: Patient not following commands but will respond to verbal stimuli, not cooperative EYE: Nonicteric sclerae ENT: Dry MM CV: Nontachycardic RESP: No audible wheezing GI: NABS, protuberant abdomen, rounded, increased bowel sounds with tinkling RLQ, without rebound or guarding MSK/EXT: Trace bilateral pedal edema SKIN: Spider angiomata noted on upper thorax, no jaundice NEURO: Unable to assess asterixis due to bilateral spasticity but he has clonus in the upper and lower extremities  Intake/Output from previous day: 01/06 0701 - 01/07 0700 In: 360 [P.O.:360] Out: 400 [Urine:400] Intake/Output this shift: Total I/O In: 360 [P.O.:360] Out: 600 [Urine:250; Stool:350]  Studies/Results: DG CHEST PORT 1 VIEW  Result Date: 04/03/2022 CLINICAL DATA:  Pleural effusions. EXAM: PORTABLE CHEST 1 VIEW COMPARISON:  None Available. FINDINGS: Bilateral pleural effusions persists,  smaller in the interval. Cardiomediastinal silhouette is normal. No pneumothorax. No other acute abnormalities. IMPRESSION: Bilateral pleural effusions, smaller in the interval. No other changes. Electronically Signed   By: Dorise Bullion III M.D.   On: 04/03/2022 12:46    Lab Results: Recent Labs    04/02/22 0343 04/03/22 0410 04/04/22 0421  WBC 3.7* 7.8 9.2  HGB 10.4* 12.4* 12.1*  HCT 31.8* 37.0* 36.1*  PLT 100* 96* 116*   BMET Recent Labs    04/02/22 0343 04/03/22 0410 04/04/22 0421  NA 138 137 135  K 3.5 3.9 3.4*  CL 102 102 99  CO2 '27 22 26  '$ GLUCOSE 137* 205* 165*  BUN '16 21 20  '$ CREATININE 1.21 1.45* 1.40*  CALCIUM 9.2 10.2 10.2   LFT Recent Labs    04/04/22 0421  PROT 8.6*  ALBUMIN 2.9*  AST 75*  ALT 18  ALKPHOS 121  BILITOT 2.3*   PT/INR Recent Labs    04/02/22 0343 04/04/22 1021  LABPROT 16.1* 17.3*  INR 1.3* 1.4*     Scheduled Meds:  carbidopa-levodopa  1 tablet Oral TID   feeding supplement  237 mL Oral BID BM   insulin aspart  0-9 Units Subcutaneous TID WC   lactulose  30 g Oral QID   midodrine  10 mg Oral TID with meals   montelukast  10 mg Oral Daily   pantoprazole  40 mg Oral Q breakfast   rifaximin  550 mg Oral BID   spironolactone  50 mg Oral QODAY   tamsulosin  0.4 mg Oral BID   zinc  sulfate  220 mg Oral BID   Continuous Infusions:    Patient profile:   72 y.o. 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  Patient admitted with failure to thrive, progressive altered mental status and decompensated encephalopathy.    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 s/p TIPS Dec 2023 AST 75 ALT 18  Alkphos 121 TBili 2.3 INR 04/04/2022 1.4  MELD 3.0: 19 at 04/04/2022 10:21 AM MELD-Na: 19 at 04/04/2022 10:21 AM Calculated from: Serum  Creatinine: 1.40 mg/dL at 04/04/2022  4:21 AM Serum Sodium: 135 mmol/L at 04/04/2022  4:21 AM Total Bilirubin: 2.3 mg/dL at 04/04/2022  4:21 AM Serum Albumin: 2.9 g/dL at 04/04/2022  4:21 AM INR(ratio): 1.4 at 04/04/2022 10:21 AM Age at listing (hypothetical): 85 years Sex: Male at 04/04/2022 10:21 AM   Hepatic Encephalopathy:  04/01/2022 Ammonia 99 -Normal urine, chronic pleural effusion however no dyspnea, leukocytosis - Lactulose 54m QID - has had twice today - Rifaximin '550mg'$  bid - zinc added yesterday - minimize/remove all benzos and narcotics -No evidence of infection at this time, any increasing leukocytosis consider thoracentesis of chronic pleural effusion and send analysis of fluid. -Decreased p.o. intake due to portosystemic encephalopathy, patient is eating per joyce friend. No BM since 11, decreased gas, check Xray, may want to consider repeat CT - add on laculose enema - if condition does not improve will need to be evaluated by IR for potential TIPS stent to downsize. - recheck tomorrow to consider NG tube placement but this is very aggressive for patient with guarded prognosis.  - encourage continued goals of care discussion  Hypotension secondary to third spacing On Aldactone 50 mg every other day, Lasix stopped 01/06 On midodrine 10 mg 3 times daily BP stable  History of ascites status post TIPS 02/2022 04/01/2022 CT chest abdomen pelvis with contrast showed interval TIPS creation which appears patent at that time.  Unchanged splenomegaly, no acute abnormality. Left-sided pleural effusion more chronic no new dyspnea shortness of breath, no leukocytosis No consider ascites on exam  Variceal banding in the past S/p TIPS 03/26/2022   Principal Problem:   Hepatic encephalopathy (HCC) Active Problems:   Type 2 diabetes mellitus (HCC)   Liver cirrhosis secondary to NASH (HCC)   Portal hypertension (HCC)   Thrombocytopenia (HCC)   Chronic obstructive pulmonary disease  (HCC)   Chronic kidney disease, stage 3a (HCC)   Decompensation of cirrhosis of liver (HCC)   Pleural effusion on left   Ascending aortic aneurysm (HCC)   Chronic left hydroureteronephrosis   Parkinson's disease   S/P TIPS (transjugular intrahepatic portosystemic shunt)   [place]ammonemia ([place]   Acute on chronic renal insufficiency    LOS: 2 days   AVladimir Wood 04/04/2022, 2:13 PM

## 2022-04-04 NOTE — Progress Notes (Signed)
PROGRESS NOTE   Corey Wood  YNW:[id number] DOB: [birth year] DOA: 04/01/2022 PCP: [facility]  Brief Narrative:  72 year old white male NASH cirrhosis + varices status post multiple banding, complicated by portal hypertension and hepatic hydrothorax--status post TIPS procedure 03/26/2022 DM TY 2 HTN CKD 3 AA COPD reflux parkinsonism COPD/OSA Hospitalization 1022-01/20/2022 hematemesis-banded X4 transfused in addition--at that hospitalization developed A-fib RVR not anticoagulation candidate  Confusion over several days in the setting of probably not taking his lactulose Brought to GI office and referred to inpatient service Ammonia 99 BUN/creatinine 14/1.1 platelet 105 INR 1.2 CT chest ascending aortic aneurysm 4.1 moderate pleural effusions on the left decreased on right chronic left hydroureteronephrosis with small dependent stone CT head negative  Hospital-Problem based course  Acute hepatic encephalopathy likely secondary to recent TIPS procedure 03/26/2022 - titrate lactulose to have at least 3-5 loose stools a day - Resume rifaximin 550 twice daily, discontinue all - discontinue all medications that have potential for confusion until patient more awake coherent - Defer reversal of TIPS procedure to GI -He has an overall poor prognosis I have encouraged family to think about palliative care, if he does not wake up soon I will try to avoid aggressive measures and Dobbhoff placement and will need to have a discussion about this with the family  [name] related ascites -Continue Aldactone 50 q. other day, Lasix has been held on 1/6 We will consider resumption in the next several days - Has hypotension related to cirrhosis and third spacing so continue midodrine 10 3 times daily  Pleural effusions -He is not on oxygen and I do not think that these are significant - CXR not specific and wouldn't work up  Parkinsonism - Continue Sinemet 1 tab 3 times daily  Prior  gastric varices secondary to [place] cirrhosis -Continue Protonix 40 daily  DM TY 2 - Levemir 16 units on hold - CBGs ranging 1440-170 range -Sliding scale alone at this time  CKD 3A without any acute decompensation - Follow trends of labs  Ascending aortic dilatation 4.1 cm - Needs outpatient follow-up  DVT prophylaxis: SCD Code Status: DNR Family Communication: Discussed with wife and son at bedside Disposition:  Status is: Observation The patient will require care spanning > 2 midnights and should be moved to inpatient because:   Has cirrhosis and will need several days to get improved    Consultants:  Gastroenterology  Procedures: non  Antimicrobials: n    Subjective:  Slight more awake, but marginal-only eating fluids No pain fever chills  Flexi-Seal placed yesterday and bag has been changed multiple times Family concerned about poor urine output    Objective: Vitals:   04/03/22 1424 04/03/22 2105 04/04/22 0000 04/04/22 0602  BP: 125/88 129/88  (!) 137/96  Pulse: (!) 59 99  98  Resp:  18  18  Temp:  99 F (37.2 C)  98.6 F (37 C)  TempSrc:  Oral  Oral  SpO2: 91% 91% 99% 98%  Weight:      Height:        Intake/Output Summary (Last 24 hours) at 04/04/2022 1118 Last data filed at 04/04/2022 0900 Gross per 24 hour  Intake 360 ml  Output 200 ml  Net 160 ml    Filed Weights   04/02/22 0026 04/02/22 0500 04/03/22 0500  Weight: 70.1 kg 70.5 kg 70.6 kg    Examination:  Less tremulous slightly more awake although not making much sense still  some spasming of the face upper  extremities and is hyperreflexic Abdomen is obese nontender no rebound he does have some shifting dullness Chest is clear no wheeze rales rhonchi Able to sit up a little better than prior No lower extremity edema   Data Reviewed: personally reviewed   CBC    Component Value Date/Time   WBC 9.2 04/04/2022 0421   RBC 3.87 (L) 04/04/2022 0421   HGB 12.1 (L) 04/04/2022 0421    HCT 36.1 (L) 04/04/2022 0421   PLT 116 (L) 04/04/2022 0421   MCV 93.3 04/04/2022 0421   MCH 31.3 04/04/2022 0421   MCHC 33.5 04/04/2022 0421   RDW 17.1 (H) 04/04/2022 0421   LYMPHSABS 0.7 04/01/2022 1201   MONOABS 0.3 04/01/2022 1201   EOSABS 0.2 04/01/2022 1201   BASOSABS 0.1 04/01/2022 1201      Latest Ref Rng & Units 04/04/2022    4:21 AM 04/03/2022    4:10 AM 04/02/2022    3:43 AM  CMP  Glucose 70 - 99 mg/dL 165  205  137   BUN 8 - 23 mg/dL '20  21  16   '$ Creatinine 0.61 - 1.24 mg/dL 1.40  1.45  1.21   Sodium 135 - 145 mmol/L 135  137  138   Potassium 3.5 - 5.1 mmol/L 3.4  3.9  3.5   Chloride 98 - 111 mmol/L 99  102  102   CO2 22 - 32 mmol/L '26  22  27   '$ Calcium 8.9 - 10.3 mg/dL 10.2  10.2  9.2   Total Protein 6.5 - 8.1 g/dL 8.6  8.6  7.4   Total Bilirubin 0.3 - 1.2 mg/dL 2.3  2.0  1.2   Alkaline Phos 38 - 126 U/L 121  124  102   AST 15 - 41 U/L 75  63  32   ALT 0 - 44 U/L '18  23  16      '$ Radiology Studies: DG CHEST PORT 1 VIEW  Result Date: 04/03/2022 CLINICAL DATA:  Pleural effusions. EXAM: PORTABLE CHEST 1 VIEW COMPARISON:  None Available. FINDINGS: Bilateral pleural effusions persists, smaller in the interval. Cardiomediastinal silhouette is normal. No pneumothorax. No other acute abnormalities. IMPRESSION: Bilateral pleural effusions, smaller in the interval. No other changes. Electronically Signed   By: Dorise Bullion III M.D.   On: 04/03/2022 12:46     Scheduled Meds:  carbidopa-levodopa  1 tablet Oral TID   feeding supplement  237 mL Oral BID BM   insulin aspart  0-9 Units Subcutaneous TID WC   lactulose  30 g Oral QID   midodrine  10 mg Oral TID with meals   montelukast  10 mg Oral Daily   pantoprazole  40 mg Oral Q breakfast   rifaximin  550 mg Oral BID   spironolactone  50 mg Oral QODAY   tamsulosin  0.4 mg Oral BID   zinc sulfate  220 mg Oral BID   Continuous Infusions:   LOS: 2 days   Time spent: 16  Nita Sells, MD [facility] To  contact the attending provider between 7A-7P or the covering provider during after hours 7P-7A, please log into the web site [email or web address] and access using universal [place] password for that web site. If you do not have the password, please call the hospital operator.  04/04/2022, 11:18 AM

## 2022-04-04 NOTE — TOC Progression Note (Signed)
Transition of Care [facility]) - Progression Note    Patient Details  Name: Corey Wood MRN: [id number] Date of Birth: December 26, 1950  Transition of Care [facility]) CM/SW Contact  Henrietta Dine, RN Phone Number: 04/04/2022, 5:58 PM  Clinical Narrative:    Pt from home w/ spouse; pt seen at [facility] in [place]; per GI will discuss w/ IR and family downsizing TIPS; TOC will con't to follow.     Barriers to Discharge: Continued Medical Work up  Expected Discharge Plan and Services                                               Social Determinants of Health (SDOH) Interventions SDOH Screenings   Food Insecurity: No Food Insecurity (04/02/2022)  Housing: Low Risk  (04/02/2022)  Transportation Needs: No Transportation Needs (04/02/2022)  Utilities: Not At Risk (04/02/2022)  Tobacco Use: Low Risk  (04/01/2022)    Readmission Risk Interventions    05/02/2020    2:41 PM  Readmission Risk Prevention Plan  Transportation Screening Complete  PCP or Specialist Appt within 3-5 Days Complete  HRI or [place] Complete  Social Work Consult for [place] Planning/Counseling Complete  Palliative Care Screening Not Applicable  Medication Review [employer]) Referral to Pharmacy

## 2022-04-05 DIAGNOSIS — K7290 Hepatic failure, unspecified without coma: Secondary | ICD-10-CM | POA: Diagnosis not present

## 2022-04-05 DIAGNOSIS — K7581 Nonalcoholic steatohepatitis (NASH): Secondary | ICD-10-CM | POA: Diagnosis not present

## 2022-04-05 DIAGNOSIS — K7682 Hepatic encephalopathy: Secondary | ICD-10-CM | POA: Diagnosis not present

## 2022-04-05 DIAGNOSIS — Z95828 Presence of other vascular implants and grafts: Secondary | ICD-10-CM | POA: Diagnosis not present

## 2022-04-05 LAB — CBC WITH DIFFERENTIAL/PLATELET
Abs Immature Granulocytes: 0.03 10*3/uL (ref 0.00–0.07)
Basophils Absolute: 0.1 10*3/uL (ref 0.0–0.1)
Basophils Relative: 1 %
Eosinophils Absolute: 0.3 10*3/uL (ref 0.0–0.5)
Eosinophils Relative: 4 %
HCT: 35.4 % — ABNORMAL LOW (ref 39.0–52.0)
Hemoglobin: 11.6 g/dL — ABNORMAL LOW (ref 13.0–17.0)
Immature Granulocytes: 0 %
Lymphocytes Relative: 10 %
Lymphs Abs: 0.7 10*3/uL (ref 0.7–4.0)
MCH: 30.9 pg (ref 26.0–34.0)
MCHC: 32.8 g/dL (ref 30.0–36.0)
MCV: 94.1 fL (ref 80.0–100.0)
Monocytes Absolute: 0.5 10*3/uL (ref 0.1–1.0)
Monocytes Relative: 7 %
Neutro Abs: 5.5 10*3/uL (ref 1.7–7.7)
Neutrophils Relative %: 78 %
Platelets: 103 10*3/uL — ABNORMAL LOW (ref 150–400)
RBC: 3.76 MIL/uL — ABNORMAL LOW (ref 4.22–5.81)
RDW: 16.8 % — ABNORMAL HIGH (ref 11.5–15.5)
WBC: 7 10*3/uL (ref 4.0–10.5)
nRBC: 0 % (ref 0.0–0.2)

## 2022-04-05 LAB — COMPREHENSIVE METABOLIC PANEL
ALT: 23 U/L (ref 0–44)
AST: 64 U/L — ABNORMAL HIGH (ref 15–41)
Albumin: 2.8 g/dL — ABNORMAL LOW (ref 3.5–5.0)
Alkaline Phosphatase: 114 U/L (ref 38–126)
Anion gap: 9 (ref 5–15)
BUN: 21 mg/dL (ref 8–23)
CO2: 26 mmol/L (ref 22–32)
Calcium: 10.3 mg/dL (ref 8.9–10.3)
Chloride: 99 mmol/L (ref 98–111)
Creatinine, Ser: 1.43 mg/dL — ABNORMAL HIGH (ref 0.61–1.24)
GFR, Estimated: 52 mL/min — ABNORMAL LOW (ref >60)
Glucose, Bld: 196 mg/dL — ABNORMAL HIGH (ref 70–99)
Potassium: 3.3 mmol/L — ABNORMAL LOW (ref 3.5–5.1)
Sodium: 134 mmol/L — ABNORMAL LOW (ref 135–145)
Total Bilirubin: 2 mg/dL — ABNORMAL HIGH (ref 0.3–1.2)
Total Protein: 7.9 g/dL (ref 6.5–8.1)

## 2022-04-05 LAB — GLUCOSE, CAPILLARY
Glucose-Capillary: 190 mg/dL — ABNORMAL HIGH (ref 70–99)
Glucose-Capillary: 284 mg/dL — ABNORMAL HIGH (ref 70–99)
Glucose-Capillary: 310 mg/dL — ABNORMAL HIGH (ref 70–99)
Glucose-Capillary: 179 mg/dL — ABNORMAL HIGH (ref 70–99)

## 2022-04-05 LAB — BASIC METABOLIC PANEL
Anion gap: 9 (ref 5–15)
BUN: 22 mg/dL (ref 8–23)
CO2: 26 mmol/L (ref 22–32)
Calcium: 10.1 mg/dL (ref 8.9–10.3)
Chloride: 98 mmol/L (ref 98–111)
Creatinine, Ser: 1.53 mg/dL — ABNORMAL HIGH (ref 0.61–1.24)
GFR, Estimated: 48 mL/min — ABNORMAL LOW (ref >60)
Glucose, Bld: 279 mg/dL — ABNORMAL HIGH (ref 70–99)
Potassium: 3.6 mmol/L (ref 3.5–5.1)
Sodium: 133 mmol/L — ABNORMAL LOW (ref 135–145)

## 2022-04-05 LAB — CBC
HCT: 35.6 % — ABNORMAL LOW (ref 39.0–52.0)
Hemoglobin: 12.2 g/dL — ABNORMAL LOW (ref 13.0–17.0)
MCH: 31.3 pg (ref 26.0–34.0)
MCHC: 34.3 g/dL (ref 30.0–36.0)
MCV: 91.3 fL (ref 80.0–100.0)
Platelets: 107 10*3/uL — ABNORMAL LOW (ref 150–400)
RBC: 3.9 MIL/uL — ABNORMAL LOW (ref 4.22–5.81)
RDW: 16.8 % — ABNORMAL HIGH (ref 11.5–15.5)
WBC: 7.5 10*3/uL (ref 4.0–10.5)
nRBC: 0 % (ref 0.0–0.2)

## 2022-04-05 NOTE — Progress Notes (Addendum)
PROGRESS NOTE   Corey Wood  HLK:[id number] DOB: May 05, 1950 DOA: 04/01/2022 [facility]CP: [facility]  Brief Narrative:  72 year old white male NASH cirrhosis + varices status post multiple banding, complicated by portal hypertension and hepatic hydrothorax--status post TIPS procedure 03/26/2022 DM TY 2 HTN CKD 3 AA COPD reflux parkinsonism COPD/OSA Hospitalization 1022-01/20/2022 hematemesis-banded X4 transfused in addition--at that hospitalization developed A-fib RVR not anticoagulation candidate  Confusion over several days in the setting of probably not taking his lactulose Brought to GI office and referred to inpatient service Ammonia 99 BUN/creatinine 14/1.1 platelet 105 INR 1.2 CT chest ascending aortic aneurysm 4.1 moderate pleural effusions on the left decreased on right chronic left hydroureteronephrosis with small dependent stone CT head negative  Hospital-Problem based course  Acute hepatic encephalopathy likely secondary to recent TIPS procedure 03/26/2022 - On 30 g 4 times daily lactulose and mentation is improving - Resume rifaximin 550 twice daily, discontinue all - Defer reversal of TIPS procedure to GI - Appetite is improving as well hold on NG tube placement for feeding -I will ask palliative care to see  Possible ileus on x-ray - He is having multiple liquid stools, will order CT scan for tomorrow if he continues to be distended however I think clinically he is softer than he has been - Liquid diet as per GI  [name] related ascites -Continue Aldactone 50 q. other day, Lasix has been held on 1/6 - Has hypotension related to cirrhosis and third spacing so continue midodrine 10 3 times daily  Pleural effusions -He is not on oxygen and I do not think that these are significant - CXR not specific and wouldn't work up - Follow only  Parkinsonism - Continue Sinemet 1 tab 3 times daily  Prior gastric varices secondary to [place] cirrhosis -Continue Protonix 40  daily  DM TY 2 - Levemir 16 units on hold - CBGs ranging 142 upto 280 -Sliding scale alone at this time  CKD 3A without any acute decompensation - Follow trends of labs  Ascending aortic dilatation 4.1 cm - Needs outpatient follow-up  DVT prophylaxis: SCD Code Status: DNR Family Communication: Discussed with wife and son at bedside Disposition:  Status is: Observation The patient will require care spanning > 2 midnights and should be moved to inpatient because:   Has cirrhosis and will need several days to get improved    Consultants:  Gastroenterology  Procedures: non  Antimicrobials: n    Subjective:  More awake more interactive can tell me name and place Ate a banana today and tolerating liquids   Objective: Vitals:   04/04/22 0602 04/04/22 2136 04/05/22 0539 04/05/22 0600  BP: (!) 137/96 121/69 103/78   Pulse: 98 98 91   Resp: '18 16 16   '$ Temp: 98.6 F (37 C) 98.3 F (36.8 C) 98.9 F (37.2 C)   TempSrc: Oral Oral Oral   SpO2: 98% 98% 100%   Weight:    70.2 kg  Height:        Intake/Output Summary (Last 24 hours) at 04/05/2022 1146 Last data filed at 04/05/2022 0539 Gross per 24 hour  Intake 110 ml  Output 2725 ml  Net -2615 ml    Filed Weights   04/02/22 0500 04/03/22 0500 04/05/22 0600  Weight: 70.5 kg 70.6 kg 70.2 kg    Examination:  Less tremulous and more awake S2 sinus tachycardia Abdomen is distended but less so than prior Chest is clear no wheeze rales rhonchi Able to sit up a little  better than prior No lower extremity edema   Data Reviewed: personally reviewed   CBC    Component Value Date/Time   WBC 7.0 04/05/2022 1050   RBC 3.76 (L) 04/05/2022 1050   HGB 11.6 (L) 04/05/2022 1050   HCT 35.4 (L) 04/05/2022 1050   PLT 103 (L) 04/05/2022 1050   MCV 94.1 04/05/2022 1050   MCH 30.9 04/05/2022 1050   MCHC 32.8 04/05/2022 1050   RDW 16.8 (H) 04/05/2022 1050   LYMPHSABS 0.7 04/05/2022 1050   MONOABS 0.5 04/05/2022 1050    EOSABS 0.3 04/05/2022 1050   BASOSABS 0.1 04/05/2022 1050      Latest Ref Rng & Units 04/05/2022   10:50 AM 04/05/2022    3:43 AM 04/04/2022    4:21 AM  CMP  Glucose 70 - 99 mg/dL 279  196  165   BUN 8 - 23 mg/dL '22  21  20   '$ Creatinine 0.61 - 1.24 mg/dL 1.53  1.43  1.40   Sodium 135 - 145 mmol/L 133  134  135   Potassium 3.5 - 5.1 mmol/L 3.6  3.3  3.4   Chloride 98 - 111 mmol/L 98  99  99   CO2 22 - 32 mmol/L '26  26  26   '$ Calcium 8.9 - 10.3 mg/dL 10.1  10.3  10.2   Total Protein 6.5 - 8.1 g/dL  7.9  8.6   Total Bilirubin 0.3 - 1.2 mg/dL  2.0  2.3   Alkaline Phos 38 - 126 U/L  114  121   AST 15 - 41 U/L  64  75   ALT 0 - 44 U/L  23  18      Radiology Studies: DG Abd Portable 1V  Result Date: 04/04/2022 CLINICAL DATA:  Concern for ileus.  Passing less gas. EXAM: PORTABLE ABDOMEN - 1 VIEW COMPARISON:  01/17/2022 and CT 04/01/2022 FINDINGS: Gaseous distention of the stomach. Numerous gas-filled loops of small and large bowel in the abdomen which are normal in caliber. TIPS. Left left-greater-than-right pleural effusions and atelectasis/consolidation. IMPRESSION: Gaseous distention of the stomach. Gas-filled loops of small and large bowel without evidence of obstruction. Findings suggestive of ileus. Left-greater-than-right pleural effusions and associated consolidation/atelectasis. Electronically Signed   By: Placido Sou M.D.   On: 04/04/2022 19:56   DG CHEST PORT 1 VIEW  Result Date: 04/03/2022 CLINICAL DATA:  Pleural effusions. EXAM: PORTABLE CHEST 1 VIEW COMPARISON:  None Available. FINDINGS: Bilateral pleural effusions persists, smaller in the interval. Cardiomediastinal silhouette is normal. No pneumothorax. No other acute abnormalities. IMPRESSION: Bilateral pleural effusions, smaller in the interval. No other changes. Electronically Signed   By: Dorise Bullion III M.D.   On: 04/03/2022 12:46     Scheduled Meds:  carbidopa-levodopa  1 tablet Oral TID   feeding supplement  237 mL  Oral BID BM   insulin aspart  0-9 Units Subcutaneous TID WC   lactulose  30 g Oral QID   midodrine  10 mg Oral TID with meals   montelukast  10 mg Oral Daily   pantoprazole  40 mg Oral Q breakfast   rifaximin  550 mg Oral BID   spironolactone  50 mg Oral QODAY   tamsulosin  0.4 mg Oral BID   zinc sulfate  220 mg Oral BID   Continuous Infusions:   LOS: 3 days   Time spent: [place], MD [facility] To contact the attending provider between 7A-7P or the covering provider during after  hours 7P-7A, please log into the web site [email or web address] and access using universal [place] password for that web site. If you do not have the password, please call the hospital operator.  04/05/2022, 11:46 AM

## 2022-04-05 NOTE — Plan of Care (Signed)
  Problem: Education: Goal: Knowledge of General Education information will improve Description: Including pain rating scale, medication(s)/side effects and non-pharmacologic comfort measures Outcome: Progressing   Problem: Elimination: Goal: Will not experience complications related to bowel motility Outcome: Progressing   Problem: Safety: Goal: Ability to remain free from injury will improve Outcome: Progressing   

## 2022-04-05 NOTE — Plan of Care (Signed)
  Problem: Coping: Goal: Ability to adjust to condition or change in health will improve Outcome: Progressing   Problem: Metabolic: Goal: Ability to maintain appropriate glucose levels will improve Outcome: Progressing   Problem: Nutritional: Goal: Maintenance of adequate nutrition will improve Outcome: Progressing

## 2022-04-05 NOTE — Progress Notes (Addendum)
Patient ID: Corey Wood, male   DOB: [birth year], 72 y.o.   MRN: [id number]    Progress Note   Subjective   Day # 4  CC; decompensated cryptogenic cirrhosis, status post TIPS December29  2023 with subsequent failure to thrive and encephalopathy  Xifaxan Zinc Lactulose 30 gm qid  Sodium 134/potassium 3.3/BUN 21/creatinine 1.43 T. bili 2.0/alk phos 114/ALT 23/AST 64 WBC 7.5/hemoglobin 12.2/hematocrit 35.6/platelets 107 INR 1.4 M ELD-Na= 19  KUB yesterday consistent with ileus Left greater than right pleural effusions and associated consolidation/atelectasis  CT chest abdomen and pelvis 04/01/2022-similar moderate left pleural effusion and decreased right pleural effusion with persistent near complete collapse of bilateral lower lobes, mild interlobar septal thickening suggestive of mild pulmonary edema, 4.1 cm abdominal aortic aneurysm chronic left hydro ureteronephrosis 2 mm stone in the distal left ureter, interval TIPS creation which appears patent  Family at bedside-patient's wife says he was speaking for the first time this morning-said something about fishing-he is able to say hello currently but will not answer any other questions, keeping eyes closed   Objective   Vital signs in last 24 hours: Temp:  [98.3 F (36.8 C)-98.9 F (37.2 C)] 98.9 F (37.2 C) (01/08 0539) Pulse Rate:  [91-98] 91 (01/08 0539) Resp:  [16] 16 (01/08 0539) BP: (103-121)/(69-78) 103/78 (01/08 0539) SpO2:  [98 %-100 %] 100 % (01/08 0539) Weight:  [70.2 kg] 70.2 kg (01/08 0600) Last BM Date : 04/04/22 General: Elderly   white male in NAD, chronically ill-appearing Heart:  Regular rate and rhythm; no murmurs Lungs: Respirations even and unlabored, decreased breath sounds bilateral bases Abdomen: Somewhat distended, bowel sounds tinkling nontender Extremities:  Without edema. Neurologic: Awakens to voice and able to say hello but unable to answer other questions eyes closed, still somewhat  tremulous/Parkinson's-unable to assess for asterixis Psych: Remains somnolent for most part.  Intake/Output from previous day: 01/07 0701 - 01/08 0700 In: 24 [P.O.:470] Out: 2975 [Urine:900; Stool:2075] Intake/Output this shift: No intake/output data recorded.  Lab Results: Recent Labs    04/03/22 0410 04/04/22 0421 04/05/22 0343  WBC 7.8 9.2 7.5  HGB 12.4* 12.1* 12.2*  HCT 37.0* 36.1* 35.6*  PLT 96* 116* 107*   BMET Recent Labs    04/03/22 0410 04/04/22 0421 04/05/22 0343  NA 137 135 134*  K 3.9 3.4* 3.3*  CL 102 99 99  CO2 '22 26 26  '$ GLUCOSE 205* 165* 196*  BUN '21 20 21  '$ CREATININE 1.45* 1.40* 1.43*  CALCIUM 10.2 10.2 10.3   LFT Recent Labs    04/05/22 0343  PROT 7.9  ALBUMIN 2.8*  AST 64*  ALT 23  ALKPHOS 114  BILITOT 2.0*   PT/INR Recent Labs    04/04/22 1021  LABPROT 17.3*  INR 1.4*    Studies/Results: DG Abd Portable 1V  Result Date: 04/04/2022 CLINICAL DATA:  Concern for ileus.  Passing less gas. EXAM: PORTABLE ABDOMEN - 1 VIEW COMPARISON:  01/17/2022 and CT 04/01/2022 FINDINGS: Gaseous distention of the stomach. Numerous gas-filled loops of small and large bowel in the abdomen which are normal in caliber. TIPS. Left left-greater-than-right pleural effusions and atelectasis/consolidation. IMPRESSION: Gaseous distention of the stomach. Gas-filled loops of small and large bowel without evidence of obstruction. Findings suggestive of ileus. Left-greater-than-right pleural effusions and associated consolidation/atelectasis. Electronically Signed   By: Placido Sou M.D.   On: 04/04/2022 19:56   DG CHEST PORT 1 VIEW  Result Date: 04/03/2022 CLINICAL DATA:  Pleural effusions. EXAM: PORTABLE CHEST 1 VIEW  COMPARISON:  None Available. FINDINGS: Bilateral pleural effusions persists, smaller in the interval. Cardiomediastinal silhouette is normal. No pneumothorax. No other acute abnormalities. IMPRESSION: Bilateral pleural effusions, smaller in the interval.  No other changes. Electronically Signed   By: Dorise Bullion III M.D.   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 He has had significant worsening of encephalopathy since that time despite Xifaxan/lactulose and zinc  Infectious workup has been negative He does have bilateral effusions left greater than right Also with ileus on KUB yesterday  Some improvement in mental status, able to say a few words today but remains somnolent for the most part  MELD-Na= 19  Flexi-Seal has been placed so can continue high-dose lactulose  Intake has been poor-since nurse confirms that he is swallowing liquids and pills without difficulty seems to hold solids in his mouth but eventually will swallow no signs of aspiration per nursing  Question raised whether should consider TIPS downsizing  #2 hypotension  felt secondary to third spacing Lasix stopped 1/ 6  On low-dose Aldactone 50 mg q. QOD-consider stopping Continues on midodrine  #3 Parkinson's disease on Sinemet #4 abdominal aortic aneurysm 5 chronic kidney disease stage III #6  adult onset diabetes mellitus  Plan; continue current doses of lactulose, Xifaxan and zinc Concern with potential aspiration with solid food, will put on full liquid diet with supplements until he is more alert Repeat  abdominal films in a.m. Keep potassium closer to 4-in setting of ileus Discontinue any pain medications  Will ask IR to see for input and advice regarding potential downsizing of TIPS    Principal Problem:   Hepatic encephalopathy ([place]) Active Problems:   Type 2 diabetes mellitus ([place])   Liver cirrhosis secondary to NASH ([place])   Portal hypertension (HCC)   Thrombocytopenia ([place])   Chronic obstructive pulmonary disease ([place])   Chronic kidney disease, stage 3a ([place])   Decompensation of  cirrhosis of liver ([place])   Pleural effusion on left   Ascending aortic aneurysm (HCC)   Chronic left hydroureteronephrosis   Parkinson's disease   S/P TIPS (transjugular intrahepatic portosystemic shunt)   Hyperammonemia (HCC)   Acute on chronic renal insufficiency     LOS: 3 days   Amy Esterwood PA-C 04/05/2022, 8:50 AM   I have taken an interval history, thoroughly reviewed the chart and examined the patient. I agree with the Advanced Practitioner's note, impression and recommendations, and have recorded additional findings, impressions and recommendations below. I performed a substantive portion of this encounter (>50% time spent), including a complete performance of the medical decision making.  Extensive chart review and conversations with colleagues required in this medically complex patient that I am picking up today on the consult service.  My additional thoughts are as follows:  Decompensated hepatic encephalopathy after recent TIPS placement.  The TIPS was done for recurrent upper GI bleeding that was refractory to endoscopic management. Negative CT scan on admission for acute issues, no apparent infectious source.  He also has bilateral pleural effusions and atelectasis.  It is certainly concerning that he has had this degree of encephalopathy when he was already on lactulose and rifaximin before TIPS placement.  We have maximized that medical management as best we can with lactulose and rifaximin, Flexi-Seal catheter placed for fecal management. Fortunately, he began showing signs of improved mental status overnight with  some responsiveness, his wife says he recognizes grandchildren during brief periods of wakefulness, and he is able to take medicine by mouth.  We have consulted our IR colleagues with a question of whether this TIPS could potentially be downsized.  This patient is DNR, which is appropriate given his age and the severity of his liver condition, and I do not  think he is a candidate for liver transplant consultation.   40 minutes were spent on this encounter (including chart review, history/exam, counseling/coordination of care, and documentation) > 50% of that time was spent on counseling and coordination of care.   Nelida Meuse III Office:[phone]

## 2022-04-05 NOTE — TOC Initial Note (Signed)
Transition of Care [facility]) - Initial/Assessment Note   Patient Details  Name: Corey Wood MRN: [id number] Date of Birth: [birth year]  Transition of Care [facility]) CM/SW Contact:    Sherie Don, LCSW Phone Number: 04/05/2022, 10:52 AM  Clinical Narrative: [facility] consulted for possible HH/SNF needs, however, patient is currently disoriented x4. TOC to follow for possible discharge needs.  Expected Discharge Plan:  (To be determined) Barriers to Discharge: Continued Medical Work up  Patient Goals and CMS Choice Patient states their goals for this hospitalization and ongoing recovery are:: Currently disoriented x4  Expected Discharge Plan and Services In-house Referral: Clinical Social Work  Prior Living Arrangements/Services Patient language and need for interpreter reviewed:: Yes Need for Family Participation in Patient Care: Yes (Comment) Care giver support system in place?: Yes (comment) Criminal Activity/Legal Involvement Pertinent to Current Situation/Hospitalization: No - Comment as needed  Activities of Daily Living Home Assistive Devices/Equipment: CBG Meter, Cane (specify quad or straight), Eyeglasses, Grab bars around toilet, Grab bars in shower, Shower chair without back (scooter and ramped entry) ADL Screening (condition at time of admission) Patient's cognitive ability adequate to safely complete daily activities?: Yes Is the patient deaf or have difficulty hearing?: No Does the patient have difficulty seeing, even when wearing glasses/contacts?: No Does the patient have difficulty concentrating, remembering, or making decisions?: Yes Patient able to express need for assistance with ADLs?: Yes Does the patient have difficulty dressing or bathing?: Yes Independently performs ADLs?: No Communication: Needs assistance Is this a change from baseline?: Pre-admission baseline Dressing (OT): Needs assistance Is this a change from baseline?: Pre-admission baseline Grooming: Needs  assistance Is this a change from baseline?: Pre-admission baseline Feeding: Needs assistance Is this a change from baseline?: Change from baseline, expected to last <3 days Bathing: Needs assistance Is this a change from baseline?: Change from baseline, expected to last <3 days Toileting: Needs assistance Is this a change from baseline?: Change from baseline, expected to last <3 days In/Out Bed: Needs assistance Is this a change from baseline?: Change from baseline, expected to last <3 days Walks in Home: Needs assistance Is this a change from baseline?: Change from baseline, expected to last <3 days Does the patient have difficulty walking or climbing stairs?: Yes Weakness of Legs: Both Weakness of Arms/Hands: Both  Emotional Assessment Orientation: :  (Patient is disoriented x4.) Alcohol / Substance Use: Not Applicable Psych Involvement: No (comment)  Admission diagnosis:  Hepatic encephalopathy ([place]) [K76.82] Acute hepatic encephalopathy ([place]) [K76.82] Patient Active Problem List   Diagnosis Date Noted   Acute on chronic renal insufficiency 04/03/2022   S/P TIPS (transjugular intrahepatic portosystemic shunt) 04/02/2022   Hyperammonemia ([place]) 04/02/2022   Hepatic encephalopathy ([place]) 04/01/2022   Pleural effusion on left 04/01/2022   Ascending aortic aneurysm ([place]) 04/01/2022   Chronic left hydroureteronephrosis 04/01/2022   Parkinson's disease 04/01/2022   NASH (nonalcoholic steatohepatitis) 03/26/2022   Decompensation of cirrhosis of liver ([place]) 03/26/2022   Chronic obstructive pulmonary disease ([place]) 01/18/2022   Chronic kidney disease, stage 3a ([place]) 01/18/2022   Hematemesis 01/17/2022   Thrombocytopenia ([place])    Portal hypertension ([place])    Esophageal varices without bleeding ([place])    COVID-19 10/14/2020   Pancytopenia ([place]) 10/14/2020   Hypokalemia 10/14/2020   Bleeding esophageal varices (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 [place] [address] Phone: [phone] Fax: [phone]  [id number] [place] [address]: [phone]: [phone] Social Determinants of Health (SDOH) Social History: [place] No Food Insecurity (04/02/2022)  Housing: Low Risk  (04/02/2022)  Transportation Needs: No Transportation Needs (04/02/2022)  Utilities: Not At Risk (04/02/2022)  Tobacco Use: Low Risk  (04/01/2022)   SDOH Interventions:    Readmission Risk Interventions    05/02/2020    2:41 PM  Readmission Risk Prevention Plan  Transportation Screening Complete  PCP or Specialist Appt within 3-5 Days Complete  HRI or [place]  Social Work Consult for RD[place] Junction[place]anning/Counseling Complete  Palliative Care Screening Not Applicable  Medication Review (Press photographer Referral to Pharmacy

## 2022-04-06 ENCOUNTER — Telehealth: Payer: Self-pay | Admitting: Gastroenterology

## 2022-04-06 ENCOUNTER — Inpatient Hospital Stay (HOSPITAL_COMMUNITY): Payer: Non-veteran care

## 2022-04-06 DIAGNOSIS — K7682 Hepatic encephalopathy: Secondary | ICD-10-CM | POA: Diagnosis not present

## 2022-04-06 DIAGNOSIS — K7290 Hepatic failure, unspecified without coma: Secondary | ICD-10-CM | POA: Diagnosis not present

## 2022-04-06 DIAGNOSIS — Z95828 Presence of other vascular implants and grafts: Secondary | ICD-10-CM | POA: Diagnosis not present

## 2022-04-06 DIAGNOSIS — K7460 Unspecified cirrhosis of liver: Secondary | ICD-10-CM | POA: Diagnosis not present

## 2022-04-06 LAB — GLUCOSE, CAPILLARY
Glucose-Capillary: 193 mg/dL — ABNORMAL HIGH (ref 70–99)
Glucose-Capillary: 252 mg/dL — ABNORMAL HIGH (ref 70–99)
Glucose-Capillary: 221 mg/dL — ABNORMAL HIGH (ref 70–99)
Glucose-Capillary: 236 mg/dL — ABNORMAL HIGH (ref 70–99)

## 2022-04-06 LAB — COMPREHENSIVE METABOLIC PANEL
ALT: 20 U/L (ref 0–44)
AST: 56 U/L — ABNORMAL HIGH (ref 15–41)
Albumin: 3.2 g/dL — ABNORMAL LOW (ref 3.5–5.0)
Alkaline Phosphatase: 122 U/L (ref 38–126)
Anion gap: 10 (ref 5–15)
BUN: 24 mg/dL — ABNORMAL HIGH (ref 8–23)
CO2: 24 mmol/L (ref 22–32)
Calcium: 10.4 mg/dL — ABNORMAL HIGH (ref 8.9–10.3)
Chloride: 97 mmol/L — ABNORMAL LOW (ref 98–111)
Creatinine, Ser: 1.33 mg/dL — ABNORMAL HIGH (ref 0.61–1.24)
GFR, Estimated: 56 mL/min — ABNORMAL LOW (ref >60)
Glucose, Bld: 205 mg/dL — ABNORMAL HIGH (ref 70–99)
Potassium: 3.3 mmol/L — ABNORMAL LOW (ref 3.5–5.1)
Sodium: 131 mmol/L — ABNORMAL LOW (ref 135–145)
Total Bilirubin: 2.2 mg/dL — ABNORMAL HIGH (ref 0.3–1.2)
Total Protein: 8.5 g/dL — ABNORMAL HIGH (ref 6.5–8.1)

## 2022-04-06 LAB — CBC WITH DIFFERENTIAL/PLATELET
Abs Immature Granulocytes: 0.06 10*3/uL (ref 0.00–0.07)
Basophils Absolute: 0.1 10*3/uL (ref 0.0–0.1)
Basophils Relative: 1 %
Eosinophils Absolute: 0.4 10*3/uL (ref 0.0–0.5)
Eosinophils Relative: 5 %
HCT: 37.3 % — ABNORMAL LOW (ref 39.0–52.0)
Hemoglobin: 12.2 g/dL — ABNORMAL LOW (ref 13.0–17.0)
Immature Granulocytes: 1 %
Lymphocytes Relative: 11 %
Lymphs Abs: 0.9 10*3/uL (ref 0.7–4.0)
MCH: 31 pg (ref 26.0–34.0)
MCHC: 32.7 g/dL (ref 30.0–36.0)
MCV: 94.9 fL (ref 80.0–100.0)
Monocytes Absolute: 0.6 10*3/uL (ref 0.1–1.0)
Monocytes Relative: 7 %
Neutro Abs: 6.1 10*3/uL (ref 1.7–7.7)
Neutrophils Relative %: 75 %
Platelets: 108 10*3/uL — ABNORMAL LOW (ref 150–400)
RBC: 3.93 MIL/uL — ABNORMAL LOW (ref 4.22–5.81)
RDW: 17.2 % — ABNORMAL HIGH (ref 11.5–15.5)
WBC: 8 10*3/uL (ref 4.0–10.5)
nRBC: 0 % (ref 0.0–0.2)

## 2022-04-06 LAB — AMMONIA: Ammonia: 73 umol/L — ABNORMAL HIGH (ref 9–35)

## 2022-04-06 LAB — MAGNESIUM: Magnesium: 2 mg/dL (ref 1.7–2.4)

## 2022-04-06 MED ORDER — POTASSIUM CHLORIDE 20 MEQ PO PACK
40.0000 meq | PACK | Freq: Every day | ORAL | Status: DC
  Administered 2022-04-06: 40 meq via ORAL
  Filled 2022-04-06: qty 2

## 2022-04-06 MED ORDER — POTASSIUM CHLORIDE CRYS ER 20 MEQ PO TBCR: 40.0000 meq | EXTENDED_RELEASE_TABLET | Freq: Every day | ORAL | Status: DC

## 2022-04-06 MED ORDER — SODIUM CHLORIDE 0.9 % IV SOLN: INTRAVENOUS | Status: DC

## 2022-04-06 MED ORDER — SODIUM CHLORIDE 0.9 % IV BOLUS
500.0000 mL | Freq: Once | INTRAVENOUS | Status: AC
  Administered 2022-04-06: 500 mL via INTRAVENOUS

## 2022-04-06 MED ORDER — LACTULOSE ENEMA
300.0000 mL | Freq: Three times a day (TID) | ORAL | Status: AC
  Administered 2022-04-06 – 2022-04-07 (×3): 300 mL via RECTAL
  Filled 2022-04-06 (×3): qty 300

## 2022-04-06 MED ORDER — POTASSIUM CHLORIDE 10 MEQ/100ML IV SOLN
10.0000 meq | INTRAVENOUS | Status: AC
  Administered 2022-04-06 (×4): 10 meq via INTRAVENOUS
  Filled 2022-04-06 (×4): qty 100

## 2022-04-06 NOTE — Telephone Encounter (Signed)
Patient called stating patient has been admitted to the hospital.  She would like you to call her.  She has a few questions regarding patient.  Thank you.

## 2022-04-06 NOTE — Plan of Care (Signed)
  Problem: Activity: Goal: Ability to return to baseline activity level will improve Outcome: Progressing   Problem: Nutritional: Goal: Maintenance of adequate nutrition will improve Outcome: Progressing   Problem: Skin Integrity: Goal: Risk for impaired skin integrity will decrease Outcome: Progressing

## 2022-04-06 NOTE — Progress Notes (Signed)
PROGRESS NOTE   Corey Wood  YOV:[id number] DOB: [birth year] DOA: 04/01/2022 PCP: [facility]  Brief Narrative:  72 year old white male NASH cirrhosis + varices status post multiple banding, complicated by portal hypertension and hepatic hydrothorax--status post TIPS procedure 03/26/2022 DM TY 2 HTN CKD 3 AA COPD reflux parkinsonism COPD/OSA Hospitalization 1022-01/20/2022 hematemesis-banded X4 transfused in addition--at that hospitalization developed A-fib RVR not anticoagulation candidate  Confusion over several days in the setting of probably not taking his lactulose Brought to GI office and referred to inpatient service Ammonia 99 BUN/creatinine 14/1.1 platelet 105 INR 1.2 CT chest ascending aortic aneurysm 4.1 moderate pleural effusions on the left decreased on right chronic left hydroureteronephrosis with small dependent stone CT head negative   He has continued to be encephalopathic although intermittently he has made progress Palliative care was consulted as well as GI Prognosis is poor  Hospital-Problem based course  Acute hepatic encephalopathy likely secondary to recent TIPS procedure 03/26/2022 - On 30 g 4 times daily lactulose mental state is again confused - Resume rifaximin 550 twice daily, discontinue all - Not a candidate for TIPS reversal apparently - Appetite is moderate tolerating liquids  Possible ileus on x-ray - Multiple loose stools and abdominal distention starting lactulose enemas - Liquid diet as per GI  [name] related ascites -Continue Aldactone 50 q. other day, Lasix has been held on 1/6 - Has hypotension related to cirrhosis and third spacing so continue midodrine 10 3 times daily  Mild hypokalemia - Replace with IV 4 runs - Recheck labs a.m.  Pleural effusions -He is not on oxygen and I do not think that these are significant - CXR not specific and wouldn't work up - Follow only  Parkinsonism - Continue Sinemet 1 tab 3 times  daily  Prior gastric varices secondary to [employer] cirrhosis -Continue Protonix 40 daily  DM TY 2 - Levemir 16 units on hold - CBGs ranging 190-250 -Sliding scale alone at this time  CKD 3A without any acute decompensation - Follow trends of labs  Ascending aortic dilatation 4.1 cm - Needs outpatient follow-up  DVT prophylaxis: SCD Code Status: DNR Family Communication: Discussed with wife and son at bedside Disposition:  Status is: Observation The patient will require care spanning > 2 midnights and should be moved to inpatient because:   His mental status not durably improved-in the next 24 to 48 hours we will need to add feeding tube and to talk about further scenarios I spoke with palliative care today who have seen the patient and family wants everything done-it is my hope that he will improve but I fear that we will have to escalate care if he continues to be confused    Consultants:  Gastroenterology  Procedures: non  Antimicrobials: n    Subjective:  Less interactive again today No distress Flexi-Seal came out yesterday and not replacing according to GI Tolerating liquids had a large liquid stool earlier today   Objective: Vitals:   04/05/22 1350 04/05/22 1357 04/05/22 2129 04/06/22 0616  BP:  123/77 110/72 121/74  Pulse: 89  82 85  Resp: '20  16 18  '$ Temp: 97.9 F (36.6 C)  98.1 F (36.7 C) 97.7 F (36.5 C)  TempSrc: Oral  Oral Oral  SpO2: 99%  99% 98%  Weight:      Height:        Intake/Output Summary (Last 24 hours) at 04/06/2022 1353 Last data filed at 04/06/2022 1000 Gross per 24 hour  Intake 600 ml  Output 688 ml  Net -88 ml    Filed Weights   04/02/22 0500 04/03/22 0500 04/05/22 0600  Weight: 70.5 kg 70.6 kg 70.2 kg    Examination:  Less tremulous no distress S2 sinus tachycardia Abdomen is distended but less so than prior Chest is clear no added sounds Trace lower extremity edema   Data Reviewed: personally reviewed   CBC     Component Value Date/Time   WBC 8.0 04/06/2022 0358   RBC 3.93 (L) 04/06/2022 0358   HGB 12.2 (L) 04/06/2022 0358   HCT 37.3 (L) 04/06/2022 0358   PLT 108 (L) 04/06/2022 0358   MCV 94.9 04/06/2022 0358   MCH 31.0 04/06/2022 0358   MCHC 32.7 04/06/2022 0358   RDW 17.2 (H) 04/06/2022 0358   LYMPHSABS 0.9 04/06/2022 0358   MONOABS 0.6 04/06/2022 0358   EOSABS 0.4 04/06/2022 0358   BASOSABS 0.1 04/06/2022 0358      Latest Ref Rng & Units 04/06/2022    3:58 AM 04/05/2022   10:50 AM 04/05/2022    3:43 AM  CMP  Glucose 70 - 99 mg/dL 205  279  196   BUN 8 - 23 mg/dL '24  22  21   '$ Creatinine 0.61 - 1.24 mg/dL 1.33  1.53  1.43   Sodium 135 - 145 mmol/L 131  133  134   Potassium 3.5 - 5.1 mmol/L 3.3  3.6  3.3   Chloride 98 - 111 mmol/L 97  98  99   CO2 22 - 32 mmol/L '24  26  26   '$ Calcium 8.9 - 10.3 mg/dL 10.4  10.1  10.3   Total Protein 6.5 - 8.1 g/dL 8.5   7.9   Total Bilirubin 0.3 - 1.2 mg/dL 2.2   2.0   Alkaline Phos 38 - 126 U/L 122   114   AST 15 - 41 U/L 56   64   ALT 0 - 44 U/L 20   23      Radiology Studies: DG Abd 2 Views  Result Date: 04/06/2022 CLINICAL DATA:  Ileus EXAM: ABDOMEN - 2 VIEW COMPARISON:  Radiograph 04/04/2022, CT 04/01/2022 FINDINGS: There is persistent diffuse dilation of bowel, including the stomach, small bowel, and colon. Increased small bowel distension in comparison to prior. Right upper quadrant stent noted. Persistent bilateral pleural effusions. Degenerative changes of the spine. IMPRESSION: Persistent diffuse bowel dilation, increased from prior exam, compatible with ileus. Recommend continued radiographic follow-up. Electronically Signed   By: Maurine Simmering M.D.   On: 04/06/2022 09:06   DG Abd Portable 1V  Result Date: 04/04/2022 CLINICAL DATA:  Concern for ileus.  Passing less gas. EXAM: PORTABLE ABDOMEN - 1 VIEW COMPARISON:  01/17/2022 and CT 04/01/2022 FINDINGS: Gaseous distention of the stomach. Numerous gas-filled loops of small and large bowel in the  abdomen which are normal in caliber. TIPS. Left left-greater-than-right pleural effusions and atelectasis/consolidation. IMPRESSION: Gaseous distention of the stomach. Gas-filled loops of small and large bowel without evidence of obstruction. Findings suggestive of ileus. Left-greater-than-right pleural effusions and associated consolidation/atelectasis. Electronically Signed   By: Placido Sou M.D.   On: 04/04/2022 19:56     Scheduled Meds:  carbidopa-levodopa  1 tablet Oral TID   feeding supplement  237 mL Oral BID BM   insulin aspart  0-9 Units Subcutaneous TID WC   lactulose  30 g Oral QID   lactulose  300 mL Rectal Q8H   midodrine  10 mg Oral TID with meals  montelukast  10 mg Oral Daily   pantoprazole  40 mg Oral Q breakfast   rifaximin  550 mg Oral BID   spironolactone  50 mg Oral QODAY   tamsulosin  0.4 mg Oral BID   zinc sulfate  220 mg Oral BID   Continuous Infusions:  potassium chloride       LOS: 4 days   Time spent: [place], MD [facility] To contact the attending provider between 7A-7P or the covering provider during after hours 7P-7A, please log into the web site [email or web address] and access using universal [place] password for that web site. If you do not have the password, please call the hospital operator.  04/06/2022, 1:53 PM

## 2022-04-06 NOTE — Telephone Encounter (Signed)
Dr. Havery Moros will you contact patient's wife when you are available? Thanks

## 2022-04-06 NOTE — Progress Notes (Signed)
   04/06/22 1406  Assess: MEWS Score  Temp (!) 97.4 F (36.3 C)  BP 100/77  MAP (mmHg) 85  Pulse Rate (!) 55  Resp (!) 26  SpO2 98 %  O2 Device Room Air  Assess: MEWS Score  MEWS Temp 0  MEWS Systolic 1  MEWS Pulse 0  MEWS RR 2  MEWS LOC 0  MEWS Score 3  MEWS Score Color Yellow  Assess: if the MEWS score is Yellow or Red  Were vital signs taken at a resting state? Yes  Focused Assessment No change from prior assessment  Does the patient meet 2 or more of the SIRS criteria? No  MEWS guidelines implemented *See Row Information* Yes  Treat  MEWS Interventions Escalated (See documentation below)  Faces Pain Scale 0  Take Vital Signs  Increase Vital Sign Frequency  Yellow: Q 2hr X 2 then Q 4hr X 2, if remains yellow, continue Q 4hrs  Escalate  MEWS: Escalate Yellow: discuss with charge nurse/RN and consider discussing with provider and RRT  Notify: Charge Nurse/RN  Name of Charge Nurse/RN Notified Blima Dessert  Date Charge Nurse/RN Notified 04/06/22  Time Charge Nurse/RN Notified 1408  Provider Notification  Provider Name/Title Dr. Verlon Au  Date Provider Notified 04/06/22  Time Provider Notified 1408  Method of Notification Face-to-face  Notification Reason Other (Comment) (Provider at nurse's station)  Provider response See new orders  Date of Provider Response 04/06/22  Time of Provider Response 1408  Notify: Rapid Response  Name of Rapid Response RN Notified Deborah  Date Rapid Response Notified 04/06/22  Time Rapid Response Notified [id number]  Document  Progress note created (see row info) Yes  Assess: SIRS CRITERIA  SIRS Temperature  0  SIRS Pulse 0  SIRS Respirations  1  SIRS WBC 0  SIRS Score Sum  1   Provider notified and rapid called. Bolus ordered, EKG performed.

## 2022-04-06 NOTE — Progress Notes (Addendum)
Patient ID: Corey Wood, male   DOB: Aug 16, 1950, 72 y.o.   MRN: [id number]    Progress Note   Subjective   Day # 6  CC; decompensated cryptogenic cirrhosis status post TIPS 03/26/2022, subsequent failure to thrive and progressive encephalopathy  Xifaxan Zinc Lactulose 30 g 4 times daily  Discussed potential downsizing of TIPS with IR yesterday-current stent is 8 mm, which is small and IR not in favor of trying to downsize any further  Ammonia level pending today WBC 8.0/hemoglobin 12.2/hematocrit 37.3 Sodium 131/potassium 3.3 BUN 24/creatinine 1.33 stable  Abdominal films yesterday persistent diffuse bowel dilation consistent with ileus Persistent bilateral pleural effusions.  Wife at bedside, has just met with palliative care.  Patient has not had any improvement in mental status since yesterday he will say hello and answers yes to abdominal pain but keeps eyes closed and no other conversation He took a full liquid diet this morning then had increasing abdominal distention he has not had any vomiting but does complain of abdominal discomfort when asked  Flexiseal is in place with about 250 cc of stool output overnight.     Objective   Vital signs in last 24 hours: Temp:  [97.7 F (36.5 C)-98.1 F (36.7 C)] 97.7 F (36.5 C) (01/09 0616) Pulse Rate:  [82-89] 85 (01/09 0616) Resp:  [16-20] 18 (01/09 0616) BP: (110-123)/(72-77) 121/74 (01/09 0616) SpO2:  [98 %-99 %] 98 % (01/09 0616) Last BM Date : 04/05/22 (flexiseal) General:   Older white male in NAD-remains somnolent, will answer in one-word if repeatedly asked no tremor currently but hands clenched able to assess for asterixis Heart:  Regular rate and rhythm; no murmurs Lungs: Respirations even and unlabored, lungs CTA bilaterally Abdomen: Distended and tympanitic bowel sounds present, somewhat high-pitched mild diffuse tenderness Extremities:  Without edema. Neurologic: Eyes closed, but able to say hello and yes or  no but no other verbal communication   Intake/Output from previous day: 01/08 0701 - 01/09 0700 In: 240 [P.O.:240] Out: 650 [Urine:450; Stool:200] Intake/Output this shift: Total I/O In: 600 [P.O.:600] Out: 0   Lab Results: Recent Labs    04/05/22 0343 04/05/22 1050 04/06/22 0358  WBC 7.5 7.0 8.0  HGB 12.2* 11.6* 12.2*  HCT 35.6* 35.4* 37.3*  PLT 107* 103* 108*   BMET Recent Labs    04/05/22 0343 04/05/22 1050 04/06/22 0358  NA 134* 133* 131*  K 3.3* 3.6 3.3*  CL 99 98 97*  CO2 '26 26 24  '$ GLUCOSE 196* 279* 205*  BUN 21 22 24*  CREATININE 1.43* 1.53* 1.33*  CALCIUM 10.3 10.1 10.4*   LFT Recent Labs    04/06/22 0358  PROT 8.5*  ALBUMIN 3.2*  AST 56*  ALT 20  ALKPHOS 122  BILITOT 2.2*   PT/INR Recent Labs    04/04/22 1021  LABPROT 17.3*  INR 1.4*    Studies/Results: DG Abd 2 Views  Result Date: 04/06/2022 CLINICAL DATA:  Ileus EXAM: ABDOMEN - 2 VIEW COMPARISON:  Radiograph 04/04/2022, CT 04/01/2022 FINDINGS: There is persistent diffuse dilation of bowel, including the stomach, small bowel, and colon. Increased small bowel distension in comparison to prior. Right upper quadrant stent noted. Persistent bilateral pleural effusions. Degenerative changes of the spine. IMPRESSION: Persistent diffuse bowel dilation, increased from prior exam, compatible with ileus. Recommend continued radiographic follow-up. Electronically Signed   By: Maurine Simmering M.D.   On: 04/06/2022 09:06   DG Abd Portable 1V  Result Date: 04/04/2022 CLINICAL DATA:  Concern for  ileus.  Passing less gas. EXAM: PORTABLE ABDOMEN - 1 VIEW COMPARISON:  01/17/2022 and CT 04/01/2022 FINDINGS: Gaseous distention of the stomach. Numerous gas-filled loops of small and large bowel in the abdomen which are normal in caliber. TIPS. Left left-greater-than-right pleural effusions and atelectasis/consolidation. IMPRESSION: Gaseous distention of the stomach. Gas-filled loops of small and large bowel without  evidence of obstruction. Findings suggestive of ileus. Left-greater-than-right pleural effusions and associated consolidation/atelectasis. Electronically Signed   By: Placido Sou M.D.   On: 04/04/2022 19:56       Assessment / Plan:    #71 72 year old white male with cryptogenic/probable [name] related cirrhosis decompensated with history of recurrent GI bleeding and hepatic hydrothorax status post TIPS 03/26/2022 Per wife patient did not have any issues with encephalopathy of note prior to the procedure that had just been recommended to start Xifaxan through the [place].  Severe encephalopathy post TIPS  Ammonia 73 today still elevated  MELD-Na= 19  He is on high-dose lactulose, Xifaxan twice daily and zinc.  He has had some improvement since admission but no significant change over the past 24 hours  I think his diffuse ileus is impairing absorption/clearing of lactulose  IR does not think that downsizing the stent should be contemplated at this point as he already has a small diameter stent at 8 mm  #2 diffuse ileus new since admission-not clear what has precipitated  He may need NG placement but thus far has been tolerating liquids without difficulty Will discontinue the Flexi-Seal and do lactulose enemas today both to try to decompress the colon and to help with the persistent hepatic encephalopathy   #3 hypokalemia-will replace IV Check magnesium  #4 recent hypotension-Lasix discontinued, continues on low-dose Aldactone 50 mg every other day Continues on midodrine  #5 Parkinson's disease on Sinemet #6 adult onset diabetes mellitus #7 abdominal aortic aneurysm #8 chronic kidney disease stage III-stable  Plan; clear liquids DC Flexi-Seal today and will do 2 lactulose enemas, another lactulose enema in a.m. Continue Xifaxan twice daily Continue zinc Replace electrolytes Mobilize, at least up to chair If no improvement by tomorrow may need neuroconsult   Principal  Problem:   Hepatic encephalopathy (HCC) Active Problems:   Type 2 diabetes mellitus ([place])   Liver cirrhosis secondary to NASH (HCC)   Portal hypertension (HCC)   Thrombocytopenia ([place])   Chronic obstructive pulmonary disease ([place])   Chronic kidney disease, stage 3a ([place])   Decompensation of cirrhosis of liver (HCC)   Pleural effusion on left   Ascending aortic aneurysm (HCC)   Chronic left hydroureteronephrosis   Parkinson's disease   S/P TIPS (transjugular intrahepatic portosystemic shunt)   Hyperammonemia (HCC)   Acute on chronic renal insufficiency     LOS: 4 days   Amy EsterwoodPA-C  04/06/2022, 11:03 AM  I have taken an interval history, thoroughly reviewed the chart and examined the patient. I agree with the Advanced Practitioner's note, impression and recommendations, and have recorded additional findings, impressions and recommendations below. I performed a substantive portion of this encounter (>50% time spent), including a complete performance of the medical decision making.  My additional thoughts are as follows:  Persistent severe encephalopathy on maximal medical therapy, therapy which is limited by the new development of an ileus.  This is a negative development, creating a greater challenge to help him recover from this neurologic insult and it worsens his prognosis.  His family has met with palliative care and they seem realistic about the outlook.  We will  discontinue the Flexi-Seal, though it is probably not causing high-grade obstruction.  He also needs lactulose enemas.  Doubtful there is an additional acute neurologic condition or event contributing to his altered mental status.   Nelida Meuse III Office:[phone]

## 2022-04-06 NOTE — Consult Note (Signed)
Consultation Note Date: 04/06/2022   Patient Name: Corey Wood  DOB: Mar 22, 1951  MRN: [id number]  Age / Sex: 72 y.o., male  PCP: [facility] Referring Physician: Nita Sells, MD  Reason for Consultation: Establishing goals of care  HPI/Patient Profile: 72 y.o. male   admitted on 04/01/2022    Clinical Assessment and Goals of Care:  72 yo gentleman who lives at home with his wife in [place].  Patient has a history of diabetes hypertension stage III CKD COPD reflux parkinsonian is him, COPD and obstructive sleep apnea.  Patient was hospitalized in October 2023 for hematemesis requiring banding and blood transfusion, that hospitalization was complicated by atrial fibrillation with rapid ventricular response however patient deemed not an anticoagulation candidate.  Patient has history of [name] cirrhosis history of varices requiring multiple banding, complicated by portal hypertension and hepatic hydrothorax and he is status post TIPS procedure on 03-26-2022. Patient has been admitted to hospital medicine service for confusion over several days, CT chest showing ascending aortic aneurysm 4.1 and moderate pleural effusions patient is continued to be encephalopathic throughout this hospitalization, GI colleagues are following closely, PMT consult for ongoing goals of care discussions has been requested. Patient is resting in the bed his wife is present at bedside introduced myself and palliative care as follows: Palliative medicine is specialized medical care for people living with serious illness. It focuses on providing relief from the symptoms and stress of a serious illness. The goal is to improve quality of life for both the patient and the family. Goals of care: Broad aims of medical therapy in relation to the patient's values and preferences. Our aim is to provide medical care aimed at  enabling patients to achieve the goals that matter most to them, given the circumstances of their particular medical situation and their constraints.   Brief life review performed.  Goals wishes and values attempted to be explored, primary discussions held only with wife, patient awakens enough to take his medications and does say a few words however is not able to carry on goals of care conversations.  Remains encephalopathic.  Wife states that overall, she does understand the serious nature of the patient's current condition and that he has a high risk for ongoing decline and decompensation.  Still, for now, she states that that she is hopeful for improvement in his mental status and his overall condition.  Their granddaughter is a PA and patient states that family is searching for possible medical procedures or treatments that might be helpful.  She states that the patient loves his pet dog and likes to go to the beach and likes to go fishing.  NEXT OF KIN  wife  SUMMARY OF RECOMMENDATIONS   DNR Continue current mode of care as a time-limited trial of current interventions for the next few days.  Discussed with wife at bedside.  She is aware of the serious nature of the patient's illness and that his risk of decline and decompensation is greater than his chance of  meaningful recovery, she wishes to continue medical care for now.  She is hopeful to have further discussions with gastroenterology service.  Palliative to follow hospital course and continue to engage in goals of care discussions. Thank you for the consult.  Code Status/Advance Care Planning: DNR   Symptom Management:     Palliative Prophylaxis:  Delirium Protocol   Psycho-social/Spiritual:  Desire for further Chaplaincy support:yes Additional Recommendations: Caregiving  Support/Resources  Prognosis:  Unable to determine  Discharge Planning: To Be Determined      Primary Diagnoses: Present on Admission:  Hepatic  encephalopathy ([place])  Chronic obstructive pulmonary disease (HCC)  Liver cirrhosis secondary to NASH (HCC)  Portal hypertension (HCC)  Chronic kidney disease, stage 3a (HCC)  Thrombocytopenia (HCC)  Ascending aortic aneurysm (HCC)  Chronic left hydroureteronephrosis  Parkinson's disease  Decompensation of cirrhosis of liver ([place])   I have reviewed the medical record, interviewed the patient and family, and examined the patient. The following aspects are pertinent.  Past Medical History:  Diagnosis Date   Allergy    seasonal allergies   Anxiety    on meds   Aortic valve disorder    Arthritis    generalized (fingers)(shoulders)   Asthma    uses inhaler   Cataract    bilateral -sx    Cirrhosis (HCC)    CKD (chronic kidney disease), stage III (HCC)    only has one kidney   COPD (chronic obstructive pulmonary disease) (HCC)    Depression    on meds   DM (diabetes mellitus) (HCC)    Type II   Dyspnea    GERD (gastroesophageal reflux disease)    on meds   Heart murmur    History of colon polyps    History of COVID-19 10/14/2020   History of kidney stones    Hx of acute pancreatitis 10/2019   Hyperlipidemia    on meds   Hypertension    on meds   Hypothyroidism    not on meds at this time   Myelodysplastic syndrome ([place])    Neuromuscular disorder ([place])    per pt   Obstructive sleep apnea    Oxygen deficiency    Parkinsonism    per pt report   Peripheral edema    bilateral legs   Peripheral positional vertigo    Sleep apnea    No CPAP   Stroke [facility])    Social History   Socioeconomic History   Marital status: Married    Spouse name: Not on file   Number of children: Not on file   Years of education: Not on file   Highest education level: Not on file  Occupational History   Occupation: retired  Tobacco Use   Smoking status: Never    Passive exposure: Never   Smokeless tobacco: Never  Vaping Use   Vaping Use: Never used  Substance and Sexual Activity    Alcohol use: Yes    Alcohol/week: 1.0 standard drink of alcohol    Types: 1 Cans of beer per week    Comment: rarely   Drug use: Never   Sexual activity: Not Currently  Other Topics Concern   Not on file  Social History Narrative   Not on file   Social Determinants of Health   Financial Resource Strain: Not on file  Food Insecurity: No Food Insecurity (04/02/2022)   Hunger Vital Sign    Worried About Running Out of Food in the Last Year: Never true  Ran Out of Food in the Last Year: Never true  Transportation Needs: No Transportation Needs (04/02/2022)   PRAPARE - Hydrologist (Medical): No    Lack of Transportation (Non-Medical): No  Physical Activity: Not on file  Stress: Not on file  Social Connections: Not on file   Family History  Problem Relation Age of Onset   Liver cancer Mother    Colon cancer Neg Hx    Stomach cancer Neg Hx    Colon polyps Neg Hx    Esophageal cancer Neg Hx    Rectal cancer Neg Hx    Scheduled Meds:  carbidopa-levodopa  1 tablet Oral TID   feeding supplement  237 mL Oral BID BM   insulin aspart  0-9 Units Subcutaneous TID WC   lactulose  30 g Oral QID   lactulose  300 mL Rectal Q8H   midodrine  10 mg Oral TID with meals   montelukast  10 mg Oral Daily   pantoprazole  40 mg Oral Q breakfast   potassium chloride  40 mEq Oral Daily   rifaximin  550 mg Oral BID   spironolactone  50 mg Oral QODAY   tamsulosin  0.4 mg Oral BID   zinc sulfate  220 mg Oral BID   Continuous Infusions: PRN Meds:.albuterol, ondansetron **OR** ondansetron (ZOFRAN) IV Medications Prior to Admission:  Prior to Admission medications   Medication Sig Start Date End Date Taking? Authorizing Provider  acetaminophen (TYLENOL) 325 MG tablet Take 325 mg by mouth every 6 (six) hours as needed for moderate pain.   Yes [provider]  albuterol (VENTOLIN HFA) 108 (90 Base) MCG/ACT inhaler Inhale 1-2 puffs into the lungs every 6 (six)  hours as needed for wheezing or shortness of breath.   Yes [provider]  ASMANEX HFA 100 MCG/ACT AERO Inhale 1 puff into the lungs daily.   Yes [provider]  buPROPion (WELLBUTRIN XL) 150 MG 24 hr tablet Take 150 mg by mouth daily.   Yes [provider]  carbidopa-levodopa (SINEMET IR) 25-100 MG tablet Take 1.5 tablets by mouth in the morning, at noon, and at bedtime. 06/30/21  Yes [provider]  CODEINE SULFATE PO Take 5 mLs by mouth at bedtime.   Yes [provider]  DULoxetine (CYMBALTA) 20 MG capsule Take 40 mg by mouth daily.   Yes [provider]  ferrous sulfate 325 (65 FE) MG EC tablet Take 325 mg by mouth 3 (three) times a week. MWF   Yes [provider]  fluticasone (FLONASE) 50 MCG/ACT nasal spray Place 1 spray into both nostrils daily as needed for allergies.   Yes [provider]  furosemide (LASIX) 20 MG tablet Take 20 mg by mouth daily.   Yes [provider]  guaiFENesin (DIABETIC TUSSIN EX) 100 MG/5ML liquid Take 10 mLs by mouth at bedtime.   Yes [provider]  insulin glargine (LANTUS) 100 unit/mL SOPN Inject 16 Units into the skin daily.   Yes [provider]  ipratropium (ATROVENT) 0.03 % nasal spray Place 1 spray into both nostrils at bedtime.   Yes [provider]  latanoprost (XALATAN) 0.005 % ophthalmic solution Place 1 drop into both eyes at bedtime. 08/20/20  Yes [provider]  lidocaine (LIDODERM) 5 % Place 1 patch onto the skin daily as needed (pain). Remove & Discard patch within 12 hours or as directed by MD   Yes [provider]  loratadine (  CLARITIN) 10 MG tablet Take 10 mg by mouth daily. 09/04/20  Yes [provider]  meclizine (ANTIVERT) 25 MG tablet Take 25 mg by mouth 2 (two) times daily as needed for dizziness.   Yes [provider]  midodrine (PROAMATINE) 10 MG tablet Take 1 tablet (10 mg total) by mouth 3 (three)  times daily. 10/21/21  Yes Armbruster, Carlota Raspberry, MD  montelukast (SINGULAIR) 10 MG tablet Take 10 mg by mouth daily. 05/22/20  Yes [provider]  Multiple Vitamin (MULTIVITAMIN WITH MINERALS) TABS tablet Take 1 tablet by mouth daily.   Yes [provider]  pantoprazole (PROTONIX) 40 MG tablet Take 40 mg by mouth daily with breakfast.   Yes [provider]  Polyvinyl Alcohol-Povidone PF (REFRESH) 1.4-0.6 % SOLN Apply 1 drop to eye daily as needed (dry eyes).   Yes [provider]  spironolactone (ALDACTONE) 100 MG tablet Take 50 mg by mouth every other day.   Yes [provider]  tamsulosin (FLOMAX) 0.4 MG CAPS capsule Take 0.4 mg by mouth in the morning and at bedtime.   Yes [provider]  traZODone (DESYREL) 150 MG tablet Take 75 mg by mouth at bedtime as needed for sleep. 09/28/19  Yes [provider]  rifaximin (XIFAXAN) 550 MG TABS tablet Take 1 tablet (550 mg total) by mouth 2 (two) times daily. 04/03/22   Mansouraty, Telford Nab., MD   Allergies  Allergen Reactions   Lisinopril Cough   Review of Systems +confusion.   Physical Exam Confused Abdomen is distended Awakens and tries to talk some Trace lower extremity edema.  Regular work of breathing  Vital Signs: BP 121/74 (BP Location: Left Arm)   Pulse 85   Temp 97.7 F (36.5 C) (Oral)   Resp 18   Ht '5\' 6"'$  (1.676 m)   Wt 70.2 kg   SpO2 98%   BMI 24.98 kg/m  Pain Scale: 0-10 POSS *See Group Information*: 1-Acceptable,Awake and alert Pain Score: 0-No pain   SpO2: SpO2: 98 % O2 Device:SpO2: 98 % O2 Flow Rate: .O2 Flow Rate (L/min): 2 L/min  IO: Intake/output summary:  Intake/Output Summary (Last 24 hours) at 04/06/2022 1310 Last data filed at 04/06/2022 1000 Gross per 24 hour  Intake 720 ml  Output 688 ml  Net 32 ml    LBM: Last BM Date : 04/06/22 (flexiseal) Baseline Weight: Weight: 70.1 kg Most recent weight: Weight: 70.2 kg     Palliative  Assessment/Data:   PPS 40%  Time In:  1430 Time Out:  1530 Time Total:  60 min.  Greater than 50%  of this time was spent counseling and coordinating care related to the above assessment and plan.  Signed by: Loistine Chance, MD   Please contact Palliative Medicine Team phone at [phone] for questions and concerns.  For individual provider: See [name]

## 2022-04-06 NOTE — Telephone Encounter (Signed)
I called the patient's wife and we had a good discussion about his course today.  Unfortunately remains hospital with severe hepatic encephalopathy.  He has started lactulose enemas.  Ammonia still remains high despite high-dose oral lactulose and rifaximin and zinc.  He appears to have an ileus on x-ray which may be reducing the effectiveness of lactulose.  Hopefully with more time he can recover from this.  He is on a liquid diet and if he is not taking in much p.o. he might need Dobbhoff placed in the near future.  I did discuss his case with IR, he had an 8 mm stent placed for TIPS which is small to begin with.  They would like more time with conservative therapy before considering TIPS reduction although that may need to be considered if he does not recover in the upcoming days.  Our team will continue to follow with him closely, appreciate the inpatient service care for this patient, I will try to see him when I can in the upcoming few days.

## 2022-04-06 NOTE — Plan of Care (Signed)
Plan of care reviewed. 

## 2022-04-07 DIAGNOSIS — K567 Ileus, unspecified: Secondary | ICD-10-CM

## 2022-04-07 DIAGNOSIS — Z794 Long term (current) use of insulin: Secondary | ICD-10-CM

## 2022-04-07 DIAGNOSIS — K7460 Unspecified cirrhosis of liver: Secondary | ICD-10-CM

## 2022-04-07 DIAGNOSIS — N289 Disorder of kidney and ureter, unspecified: Secondary | ICD-10-CM

## 2022-04-07 DIAGNOSIS — J449 Chronic obstructive pulmonary disease, unspecified: Secondary | ICD-10-CM

## 2022-04-07 DIAGNOSIS — G20A1 Parkinson's disease without dyskinesia, without mention of fluctuations: Secondary | ICD-10-CM

## 2022-04-07 DIAGNOSIS — K7682 Hepatic encephalopathy: Secondary | ICD-10-CM | POA: Diagnosis not present

## 2022-04-07 DIAGNOSIS — K7290 Hepatic failure, unspecified without coma: Secondary | ICD-10-CM | POA: Diagnosis not present

## 2022-04-07 DIAGNOSIS — Z515 Encounter for palliative care: Secondary | ICD-10-CM | POA: Diagnosis not present

## 2022-04-07 DIAGNOSIS — K7581 Nonalcoholic steatohepatitis (NASH): Secondary | ICD-10-CM

## 2022-04-07 DIAGNOSIS — I7121 Aneurysm of the ascending aorta, without rupture: Secondary | ICD-10-CM

## 2022-04-07 DIAGNOSIS — N189 Chronic kidney disease, unspecified: Secondary | ICD-10-CM

## 2022-04-07 DIAGNOSIS — Z95828 Presence of other vascular implants and grafts: Secondary | ICD-10-CM | POA: Diagnosis not present

## 2022-04-07 DIAGNOSIS — E1122 Type 2 diabetes mellitus with diabetic chronic kidney disease: Secondary | ICD-10-CM | POA: Diagnosis not present

## 2022-04-07 DIAGNOSIS — N1831 Chronic kidney disease, stage 3a: Secondary | ICD-10-CM

## 2022-04-07 DIAGNOSIS — Z7189 Other specified counseling: Secondary | ICD-10-CM

## 2022-04-07 LAB — GLUCOSE, CAPILLARY
Glucose-Capillary: 204 mg/dL — ABNORMAL HIGH (ref 70–99)
Glucose-Capillary: 314 mg/dL — ABNORMAL HIGH (ref 70–99)
Glucose-Capillary: 270 mg/dL — ABNORMAL HIGH (ref 70–99)
Glucose-Capillary: 141 mg/dL — ABNORMAL HIGH (ref 70–99)

## 2022-04-07 LAB — CBC
HCT: 31.6 % — ABNORMAL LOW (ref 39.0–52.0)
Hemoglobin: 10.3 g/dL — ABNORMAL LOW (ref 13.0–17.0)
MCH: 31.4 pg (ref 26.0–34.0)
MCHC: 32.6 g/dL (ref 30.0–36.0)
MCV: 96.3 fL (ref 80.0–100.0)
Platelets: 94 10*3/uL — ABNORMAL LOW (ref 150–400)
RBC: 3.28 MIL/uL — ABNORMAL LOW (ref 4.22–5.81)
RDW: 17.3 % — ABNORMAL HIGH (ref 11.5–15.5)
WBC: 5.6 10*3/uL (ref 4.0–10.5)
nRBC: 0 % (ref 0.0–0.2)

## 2022-04-07 LAB — COMPREHENSIVE METABOLIC PANEL
ALT: 20 U/L (ref 0–44)
AST: 36 U/L (ref 15–41)
Albumin: 2.2 g/dL — ABNORMAL LOW (ref 3.5–5.0)
Alkaline Phosphatase: 96 U/L (ref 38–126)
Anion gap: 7 (ref 5–15)
BUN: 24 mg/dL — ABNORMAL HIGH (ref 8–23)
CO2: 22 mmol/L (ref 22–32)
Calcium: 9.8 mg/dL (ref 8.9–10.3)
Chloride: 104 mmol/L (ref 98–111)
Creatinine, Ser: 1.25 mg/dL — ABNORMAL HIGH (ref 0.61–1.24)
GFR, Estimated: >60 mL/min (ref >60)
Glucose, Bld: 256 mg/dL — ABNORMAL HIGH (ref 70–99)
Potassium: 4.2 mmol/L (ref 3.5–5.1)
Sodium: 133 mmol/L — ABNORMAL LOW (ref 135–145)
Total Bilirubin: 1.3 mg/dL — ABNORMAL HIGH (ref 0.3–1.2)
Total Protein: 6.5 g/dL (ref 6.5–8.1)

## 2022-04-07 LAB — AMMONIA: Ammonia: 46 umol/L — ABNORMAL HIGH (ref 9–35)

## 2022-04-07 MED ORDER — GLUCERNA SHAKE PO LIQD
237.0000 mL | Freq: Three times a day (TID) | ORAL | Status: DC
  Administered 2022-04-07 – 2022-04-09 (×7): 237 mL via ORAL
  Filled 2022-04-07 (×11): qty 237

## 2022-04-07 MED ORDER — LACTULOSE ENEMA
300.0000 mL | Freq: Two times a day (BID) | ORAL | Status: AC
  Administered 2022-04-07 (×2): 300 mL via RECTAL
  Filled 2022-04-07 (×2): qty 300

## 2022-04-07 MED ORDER — INSULIN GLARGINE-YFGN 100 UNIT/ML ~~LOC~~ SOLN
5.0000 [IU] | Freq: Every day | SUBCUTANEOUS | Status: DC
  Administered 2022-04-08: 5 [IU] via SUBCUTANEOUS
  Filled 2022-04-07: qty 0.05

## 2022-04-07 NOTE — Progress Notes (Addendum)
Patient ID: Corey Wood, male   DOB: Jul 20, 1950, 72 y.o.   MRN: [id number]    Progress Note   Subjective   Day # 6  CC; decompensated cryptogenic cirrhosis status post TIPS 03/26/2022 subsequent failure to thrive and progressive encephalopathy  Continues on Xifaxan twice daily, zinc twice daily, lactulose 30 g 4 times daily Started lactulose enemas q8 hrs  yesterday due to diffuse ileus  Labs today-ammonia 46 improved WBC 5.6/hemoglobin 10.3/hematocrit 31.6/plts 94 Sodium 133/potassium 4.2/BUN 24/creatinine 1.25  Patient's wife at bedside, she says he did not get much sleep last night with enemas etc., tired this morning Continues to keep his eyes closed, does answer that he feels okay, denies any abdominal pain today, says he is not hungry   Objective   Vital signs in last 24 hours: Temp:  [97.4 F (36.3 C)-99.4 F (37.4 C)] 97.8 F (36.6 C) (01/10 0651) Pulse Rate:  [55-91] 64 (01/10 0651) Resp:  [16-29] 17 (01/10 0651) BP: (100-126)/(58-77) 113/58 (01/10 0651) SpO2:  [97 %-100 %] 99 % (01/10 0651) Last BM Date : 04/07/22 General:    Elderly white male in NAD easier to arouse and get him to answer questions today Heart:  Regular rate and rhythm; no murmurs Lungs: Respirations even and unlabored, lungs CTA bilaterally Abdomen: Much softer, not tympanitic today bowel sounds very active.  Extremities:  Without edema. Neurologic:  Alert, disoriented to place month etc. but able to answer brief questions Psych:  Cooperative..  Intake/Output from previous day: 01/09 0701 - 01/10 0700 In: 5608.1 [P.O.:1320; I.V.:1189.1; IV Piggyback:1099] Out: 1038 [Urine:700; Stool:338] Intake/Output this shift: No intake/output data recorded.  Lab Results: Recent Labs    04/05/22 1050 04/06/22 0358 04/07/22 0343  WBC 7.0 8.0 5.6  HGB 11.6* 12.2* 10.3*  HCT 35.4* 37.3* 31.6*  PLT 103* 108* 94*   BMET Recent Labs    04/05/22 1050 04/06/22 0358 04/07/22 0343  NA 133* 131*  133*  K 3.6 3.3* 4.2  CL 98 97* 104  CO2 '26 24 22  '$ GLUCOSE 279* 205* 256*  BUN 22 24* 24*  CREATININE 1.53* 1.33* 1.25*  CALCIUM 10.1 10.4* 9.8   LFT Recent Labs    04/07/22 0343  PROT 6.5  ALBUMIN 2.2*  AST 36  ALT 20  ALKPHOS 96  BILITOT 1.3*   PT/INR No results for input(s): "LABPROT", "INR" in the last 72 hours.  Studies/Results: DG Abd 2 Views  Result Date: 04/06/2022 CLINICAL DATA:  Ileus EXAM: ABDOMEN - 2 VIEW COMPARISON:  Radiograph 04/04/2022, CT 04/01/2022 FINDINGS: There is persistent diffuse dilation of bowel, including the stomach, small bowel, and colon. Increased small bowel distension in comparison to prior. Right upper quadrant stent noted. Persistent bilateral pleural effusions. Degenerative changes of the spine. IMPRESSION: Persistent diffuse bowel dilation, increased from prior exam, compatible with ileus. Recommend continued radiographic follow-up. Electronically Signed   By: Maurine Simmering M.D.   On: 04/06/2022 09:06       Assessment / Plan:    #46 72 year old white male with cryptogenic/probable [name] related cirrhosis decompensated with history of recurrent GI bleeding, and hepatic hydrothorax now status post TIPS 03/26/2022-decompensation post TIPS with progressive encephalopathy  Patient has developed a diffuse ileus since admission, do not think he was absorbing the lactulose Started lactulose enemas yesterday, he has had 3 enemas thus far, ammonia level is coming down and he is a little bit more alert than yesterday.  Will continue lactulose enemas twice daily, as these seem to be  helping Also continue oral lactulose, Xifaxan twice daily and zinc twice daily  Hopefully he will continue have gradual improvement in mental status   #2 Parkinson's disease-on Sinemet #3 diffuse ileus as above-abd  much softer today, will not repeat abdominal films unless he has recurrent distention  Advance to low-sodium diet  #4 hypokalemia resolved #5 recent  hypotension, on midodrine, continues on every other day Aldactone 50 mg #6 adult onset diabetes mellitus #7 chronic kidney disease stage III #8 abdominal aortic aneurysm       Principal Problem:   Hepatic encephalopathy (HCC) Active Problems:   Type 2 diabetes mellitus (HCC)   Liver cirrhosis secondary to NASH (HCC)   Portal hypertension (HCC)   Thrombocytopenia (HCC)   Chronic obstructive pulmonary disease (HCC)   Chronic kidney disease, stage 3a (HCC)   Decompensation of cirrhosis of liver (HCC)   Pleural effusion on left   Ascending aortic aneurysm (HCC)   Chronic left hydroureteronephrosis   Parkinson's disease   S/P TIPS (transjugular intrahepatic portosystemic shunt)   Hyperammonemia (HCC)   Acute on chronic renal insufficiency     LOS: 5 days   Amy Esterwood PA-C 04/07/2022, 10:58 AM  I have taken an interval history, thoroughly reviewed the chart and examined the patient. I agree with the Advanced Practitioner's note, impression and recommendations, and have recorded additional findings, impressions and recommendations below. I performed a substantive portion of this encounter (>50% time spent), including a complete performance of the medical decision making.  My additional thoughts are as follows:  Patient more alert today and interactive, though still altered. He is having bowel movements, fecal collection system was removed and he is receiving lactulose enemas twice daily in addition to oral lactulose and rifaximin and zinc.  Abdomen softly distended and tympanitic, though less so than yesterday with increasing bowel sounds.  He still remains quite sick and has a way to go if he is to be discharged from this hospital.  If his mental status continues to improve, then he needs some therapy starting was sitting on the edge of the bed for mobilization and hopefully increasing from there as tolerated.  More mobility should help relieve the ileus.  It was also  encouraging that he was able to eat solid food today.  Will continue the lactulose enemas at least until tomorrow.  If his ileus seems to be improving and he is consistently taking oral nutrition and medicines, we may discontinue the enemas at that point.  We will continue to follow him closely with you.  Appreciate care team help.   Nelida Meuse III Office:[phone]

## 2022-04-07 NOTE — Plan of Care (Signed)
Plan of care reviewed. 

## 2022-04-07 NOTE — Progress Notes (Signed)
Daily Progress Note   Patient Name: Corey Wood       Date: 04/07/2022 DOB: March 05, 1951  Age: 72 y.o. MRN#: [id number] Attending Physician: Vernelle Emerald, MD Primary Care Physician: [facility] Admit Date: 04/01/2022  Reason for Consultation/Follow-up: Establishing goals of care  Subjective: More awake alert today, resting in bed, wife at bedside, patient opens eyes and tracks me in the room, even answers few questions appropriately even though he does not verbalize much.  Length of Stay: 5  Current Medications: Scheduled Meds:   carbidopa-levodopa  1 tablet Oral TID   feeding supplement (GLUCERNA SHAKE)  237 mL Oral TID BM   insulin aspart  0-9 Units Subcutaneous TID WC   lactulose  30 g Oral QID   lactulose  300 mL Rectal BID   midodrine  10 mg Oral TID with meals   montelukast  10 mg Oral Daily   pantoprazole  40 mg Oral Q breakfast   rifaximin  550 mg Oral BID   tamsulosin  0.4 mg Oral BID   zinc sulfate  220 mg Oral BID    Continuous Infusions:  sodium chloride 50 mL/hr at 04/07/22 0530    PRN Meds: albuterol, ondansetron **OR** ondansetron (ZOFRAN) IV  Physical Exam         Abdomen is less distended than yesterday Patient overall appears with generalized weakness Awake alert than yesterday Regular work of breathing  Vital Signs: BP 114/61 (BP Location: Left Arm)   Pulse 83   Temp 98.4 F (36.9 C) (Oral)   Resp 18   Ht '5\' 6"'$  (1.676 m)   Wt 70.2 kg   SpO2 96%   BMI 24.98 kg/m  SpO2: SpO2: 96 % O2 Device: O2 Device: Nasal Cannula O2 Flow Rate: O2 Flow Rate (L/min): 2 L/min  Intake/output summary:  Intake/Output Summary (Last 24 hours) at 04/07/2022 1449 Last data filed at 04/07/2022 7124 Gross per 24 hour  Intake 4648.1 ml  Output 700 ml   Net 3948.1 ml   LBM: Last BM Date : 04/07/22 Baseline Weight: Weight: 70.1 kg Most recent weight: Weight: 70.2 kg       Palliative Assessment/Data:      Patient Active Problem List   Diagnosis Date Noted   Acute on chronic renal insufficiency 04/03/2022   S/P TIPS (  transjugular intrahepatic portosystemic shunt) 04/02/2022   Hyperammonemia ([place]) 04/02/2022   Hepatic encephalopathy (HCC) 04/01/2022   Pleural effusion on left 04/01/2022   Ascending aortic aneurysm ([place]) 04/01/2022   Chronic left hydroureteronephrosis 04/01/2022   Parkinson's disease 04/01/2022   NASH (nonalcoholic steatohepatitis) 03/26/2022   Decompensation of cirrhosis of liver ([place]) 03/26/2022   Chronic obstructive pulmonary disease ([place]) 01/18/2022   Chronic kidney disease, stage 3a ([place]) 01/18/2022   Hematemesis 01/17/2022   Thrombocytopenia ([place])    Portal hypertension (HCC)    Esophageal varices without bleeding ([place])    COVID-19 10/14/2020   Pancytopenia ([place]) 10/14/2020   Hypokalemia 10/14/2020   Bleeding esophageal varices (HCC)    AKI (acute kidney injury) ([place]) 04/26/2020   Acute blood loss anemia 04/26/2020   Upper GI bleed 04/26/2020   Acute upper GI bleed 04/25/2020   Liver cirrhosis secondary to NASH [facility])    Age-related nuclear cataract of right eye 09/15/2016   Pseudophakia of left eye 09/15/2016   Intraoperative floppy iris syndrome (IFIS) 01/13/2016   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/13/2016   Acquired color vision deficiency 11/24/2015   Nuclear sclerosis of right eye 11/24/2015   Open angle with borderline findings and high glaucoma risk in both eyes 11/24/2015   Type 2 diabetes mellitus ([place]) 11/24/2015    Palliative Care Assessment & Plan   Patient Profile:   Assessment: 72 year old gentleman with decompensated cryptogenic cirrhosis status post TIPS 03-26-2022 Admitted to hospital medicine service with failure to thrive progressive encephalopathy PMT consulted for additional support  and for ongoing goals of care discussions with patient and wife.  Recommendations/Plan: DNR, patient's wife is hopeful for ongoing stabilization and recovery with regards to mental status and his oral intake.  She is thankful for GI colleagues following closely.  No acute palliative care specific needs at this time, we will follow peripherally.  Goals of Care and Additional Recommendations: Limitations on Scope of Treatment: Full Scope Treatment  Code Status:    Code Status Orders  (From admission, onward)           Start     Ordered   04/01/22 2029  Do not attempt resuscitation (DNR)  Continuous       Question Answer Comment  If patient has no pulse and is not breathing Do Not Attempt Resuscitation   If patient has a pulse and/or is breathing: Medical Treatment Goals LIMITED ADDITIONAL INTERVENTIONS: Use medication/IV fluids and cardiac monitoring as indicated; Do not use intubation or mechanical ventilation (DNI), also provide comfort medications.  Transfer to Progressive/Stepdown as indicated, avoid Intensive Care.   Consent: Discussion documented in EHR or advanced directives reviewed      04/01/22 2029           Code Status History     Date Active Date Inactive Code Status Order ID Comments User Context   03/26/2022 1710 03/27/2022 1820 Full Code [id number]  Greggory Keen, MD Inpatient   01/17/2022 2253 01/20/2022 1900 Full Code [id number]  Estill Cotta, NP ED   10/15/2020 0012 10/16/2020 1734 Full Code [id number]  Elwyn Reach, MD ED   04/29/2020 0812 05/05/2020 2039 Full Code [id number]  Arnell Asal, NP ED   04/26/2020 0136 04/27/2020 1757 Full Code [id number]  Rise Patience, MD ED      Advance Directive Documentation    Flowsheet Row Most Recent Value  Type of Advance Directive Out of facility DNR (pink MOST or yellow form), Healthcare Power of Attorney, Living will  Pre-existing out  of facility DNR order (yellow form or pink MOST form) --  "MOST"  Form in Place? --       Prognosis:  Unable to determine  Discharge Planning: To Be Determined  Care plan was discussed with patient and more discussions held with wife who is at bedside  Thank you for allowing the Palliative Medicine Team to assist in the care of this patient. MOD MDM     Greater than 50%  of this time was spent counseling and coordinating care related to the above assessment and plan.  Loistine Chance, MD  Please contact Palliative Medicine Team phone at [phone] for questions and concerns.

## 2022-04-07 NOTE — Assessment & Plan Note (Addendum)
Patient's prognosis is guarded secondary to advanced liver disease and persisting severe hepatic encephalopathy Wife confirms that patient is DNR With some clinical improvement patient's wife now is seemingly reluctant to proceed with any palliative measures. Will continue to revisit goals of care throughout hospitalization. Palliative care following, their input is appreciated.

## 2022-04-07 NOTE — Assessment & Plan Note (Signed)
X-rays on 1/7 and 1/9 suggestive of ileus Despite this patient has been tolerating his diet  monitoring clinically for now

## 2022-04-07 NOTE — Progress Notes (Signed)
PROGRESS NOTE   Corey Wood  QJJ:[id number] DOB: [birth year] DOA: 04/01/2022 PCP: [facility]   Date of Service: the patient was seen and examined on 04/07/2022  Brief Narrative:  Corey Wood is a 72 y.o. male with medical history significant for cryptogenic cirrhosis thought secondary to NASH associated with portal hypertension, esophageal and gastric varices, recurrent ascites )s/p TIPS on 03/26/2022), hepatic hydrothorax, CKD stage IIIa, Parkinson's disease, chronic thrombocytopenia, insulin-dependent T2DM who is admitted with hepatic encephalopathy.   Assessment and Plan: * Hepatic encephalopathy (HCC) Continue lactulose and rifaximin  Ammonia seems to be downtrending today  Clinically however the patient is exhibiting minimal improvement  Prognosis guarded Continuing aggressive administration of lactulose via oral and rectal routes per GI recommendation Continue Xifaxan twice daily Furthermore there is no clinical evidence of infection or gastrointestinal bleeding Continue to monitor for clinical improvement  Liver cirrhosis secondary to NASH ([place]) Known history of [name] cirrhosis complicated by gastric varices, recurrent gastrointestinal bleeding and chronic hepatic hydrothorax  Patient is status post TIPS procedure 03/26/2022  Dosing of spironolactone and Lasix have been limited by hypotension Continuing midodrine Gastroenterology following, their input is appreciated   Ileus ([place]) X-rays on 1/7 and 1/9 suggestive of ileus Despite this patient has been advanced on a diet and seems to be tolerating it.   Monitoring clinically for now  Type 2 diabetes mellitus with stage 3a chronic kidney disease, with long-term current use of insulin (HCC) Placing patient on 5 units of basal insulin daily Accu-Cheks before every meal and nightly with sliding scale insulin Hemoglobin A1c 6.6% 01/18/2022  Chronic kidney disease, stage 3a (HCC) Creatinine currently near  baseline  Input and output monitoring  Monitoring renal function and electrolytes with serial chemistries   Parkinson's disease Continue Sinemet.  Thrombocytopenia (HCC) Chronic thrombocytopenia secondary to portal hypertension.   No clinical evidence of bleeding  Monitoring platelet counts with serial CBCs   Chronic obstructive pulmonary disease (HCC) No evidence of COPD exacerbation this time As needed bronchodilator therapy for episodic shortness of breath and wheezing.   Goals of care, counseling/discussion Patient's prognosis is guarded secondary to advanced liver disease and persisting severe hepatic encephalopathy Wife confirms that patient is DNR Wife reports that patient "does not want to live like this" but is hopeful that patient will clinically improve in the next day or 2. decision has been made to have a goals of care discussion again in approximately 48 hours.  If patient is failing to clinically improve or is worsening wife would be interested in exploring alternative goals of care and palliative care options.  Chronic left hydroureteronephrosis Noted on CT imaging.  Ascending aortic aneurysm (HCC) 4.1 cm ascending aortic aneurysm seen on CT chest.  Annual imaging follow-up by CTA or MRI recommended.     Subjective:  Patient is extremely lethargic and unable to answer questions appropriately.  According to family patient's mentation is somewhat improved compared to yesterday.  Physical Exam:  Vitals:   04/07/22 0031 04/07/22 0651 04/07/22 1223 04/07/22 2119  BP: 106/69 (!) 113/58 114/61 113/80  Pulse: 91 64 83 70  Resp: '16 17 18 16  '$ Temp: (!) 97.5 F (36.4 C) 97.8 F (36.6 C) 98.4 F (36.9 C) 97.6 F (36.4 C)  TempSrc: Oral Oral Oral   SpO2: 99% 99% 96% 93%  Weight:      Height:         Constitutional: Lethargic but as well, disoriented.  No associated distress.   Skin: no  rashes, no lesions, good skin turgor noted. Eyes: Pupils are equally  reactive to light.  No evidence of scleral icterus or conjunctival pallor.  ENMT: Extremely dry mucous membranes noted.  Posterior pharynx clear of any exudate or lesions.   Respiratory: clear to auscultation bilaterally, no wheezing, no crackles. Normal respiratory effort. No accessory muscle use.  Cardiovascular: 3 out of 6 colic murmur noted.  Regular rate and rhythm.  Some distal bilateral lower extremity pitting edema.  2+ pedal pulses. No carotid bruits.  Abdomen: Abdomen is protuberant but soft and nontender.  No evidence of intra-abdominal masses.  Positive bowel sounds noted in all quadrants.   Musculoskeletal: No joint deformity upper and lower extremities. Good ROM, no contractures. Normal muscle tone.    Data Reviewed:  I have personally reviewed and interpreted labs, imaging.  Significant findings are   CBC: Recent Labs  Lab 04/01/22 1201 04/02/22 0343 04/04/22 0421 04/05/22 0343 04/05/22 1050 04/06/22 0358 04/07/22 0343  WBC 4.6   < > 9.2 7.5 7.0 8.0 5.6  NEUTROABS 3.3  --   --   --  5.5 6.1  --   HGB 11.5*   < > 12.1* 12.2* 11.6* 12.2* 10.3*  HCT 35.1*   < > 36.1* 35.6* 35.4* 37.3* 31.6*  MCV 94.6   < > 93.3 91.3 94.1 94.9 96.3  PLT 105*   < > 116* 107* 103* 108* 94*   < > = values in this interval not displayed.   Basic Metabolic Panel: Recent Labs  Lab 04/04/22 0421 04/05/22 0343 04/05/22 1050 04/06/22 0358 04/07/22 0343  NA 135 134* 133* 131* 133*  K 3.4* 3.3* 3.6 3.3* 4.2  CL 99 99 98 97* 104  CO2 '26 26 26 24 22  '$ GLUCOSE 165* 196* 279* 205* 256*  BUN '20 21 22 '$ 24* 24*  CREATININE 1.40* 1.43* 1.53* 1.33* 1.25*  CALCIUM 10.2 10.3 10.1 10.4* 9.8  MG  --   --   --  2.0  --    GFR: Estimated Creatinine Clearance: 47.5 mL/min (A) (by C-G formula based on SCr of 1.25 mg/dL (H)). Liver Function Tests: Recent Labs  Lab 04/03/22 0410 04/04/22 0421 04/05/22 0343 04/06/22 0358 04/07/22 0343  AST 63* 75* 64* 56* 36  ALT '23 18 23 20 20  '$ ALKPHOS 124  121 114 122 96  BILITOT 2.0* 2.3* 2.0* 2.2* 1.3*  PROT 8.6* 8.6* 7.9 8.5* 6.5  ALBUMIN 3.1* 2.9* 2.8* 3.2* 2.2*    Coagulation Profile: Recent Labs  Lab 04/01/22 1900 04/02/22 0343 04/04/22 1021  INR 1.2 1.3* 1.4*      Code Status:  Full code.  Code status decision has been confirmed with: wife Family Communication: Plan of care discussed with wife at the bedside.   Severity of Illness:  The appropriate patient status for this patient is INPATIENT. Inpatient status is judged to be reasonable and necessary in order to provide the required intensity of service to ensure the patient's safety. The patient's presenting symptoms, physical exam findings, and initial radiographic and laboratory data in the context of their chronic comorbidities is felt to place them at high risk for further clinical deterioration. Furthermore, it is not anticipated that the patient will be medically stable for discharge from the hospital within 2 midnights of admission.   * I certify that at the point of admission it is my clinical judgment that the patient will require inpatient hospital care spanning beyond 2 midnights from the point of admission due to  high intensity of service, high risk for further deterioration and high frequency of surveillance required.*  Time spent:  56 minutes  Author:  Vernelle Emerald MD  04/07/2022 10:45 PM

## 2022-04-08 DIAGNOSIS — K7290 Hepatic failure, unspecified without coma: Secondary | ICD-10-CM | POA: Diagnosis not present

## 2022-04-08 DIAGNOSIS — Z95828 Presence of other vascular implants and grafts: Secondary | ICD-10-CM | POA: Diagnosis not present

## 2022-04-08 DIAGNOSIS — D696 Thrombocytopenia, unspecified: Secondary | ICD-10-CM

## 2022-04-08 DIAGNOSIS — K7682 Hepatic encephalopathy: Secondary | ICD-10-CM | POA: Diagnosis not present

## 2022-04-08 DIAGNOSIS — K567 Ileus, unspecified: Secondary | ICD-10-CM | POA: Diagnosis not present

## 2022-04-08 DIAGNOSIS — K7581 Nonalcoholic steatohepatitis (NASH): Secondary | ICD-10-CM | POA: Diagnosis not present

## 2022-04-08 DIAGNOSIS — E1122 Type 2 diabetes mellitus with diabetic chronic kidney disease: Secondary | ICD-10-CM | POA: Diagnosis not present

## 2022-04-08 LAB — CBC
HCT: 31.3 % — ABNORMAL LOW (ref 39.0–52.0)
Hemoglobin: 10 g/dL — ABNORMAL LOW (ref 13.0–17.0)
MCH: 31.5 pg (ref 26.0–34.0)
MCHC: 31.9 g/dL (ref 30.0–36.0)
MCV: 98.7 fL (ref 80.0–100.0)
Platelets: 76 10*3/uL — ABNORMAL LOW (ref 150–400)
RBC: 3.17 MIL/uL — ABNORMAL LOW (ref 4.22–5.81)
RDW: 17 % — ABNORMAL HIGH (ref 11.5–15.5)
WBC: 3.8 10*3/uL — ABNORMAL LOW (ref 4.0–10.5)
nRBC: 0 % (ref 0.0–0.2)

## 2022-04-08 LAB — COMPREHENSIVE METABOLIC PANEL
ALT: 15 U/L (ref 0–44)
AST: 41 U/L (ref 15–41)
Albumin: 2.3 g/dL — ABNORMAL LOW (ref 3.5–5.0)
Alkaline Phosphatase: 91 U/L (ref 38–126)
Anion gap: 6 (ref 5–15)
BUN: 19 mg/dL (ref 8–23)
CO2: 22 mmol/L (ref 22–32)
Calcium: 9.4 mg/dL (ref 8.9–10.3)
Chloride: 105 mmol/L (ref 98–111)
Creatinine, Ser: 1 mg/dL (ref 0.61–1.24)
GFR, Estimated: >60 mL/min (ref >60)
Glucose, Bld: 148 mg/dL — ABNORMAL HIGH (ref 70–99)
Potassium: 3.8 mmol/L (ref 3.5–5.1)
Sodium: 133 mmol/L — ABNORMAL LOW (ref 135–145)
Total Bilirubin: 1.2 mg/dL (ref 0.3–1.2)
Total Protein: 6.1 g/dL — ABNORMAL LOW (ref 6.5–8.1)

## 2022-04-08 LAB — GLUCOSE, CAPILLARY
Glucose-Capillary: 154 mg/dL — ABNORMAL HIGH (ref 70–99)
Glucose-Capillary: 204 mg/dL — ABNORMAL HIGH (ref 70–99)
Glucose-Capillary: 243 mg/dL — ABNORMAL HIGH (ref 70–99)
Glucose-Capillary: 203 mg/dL — ABNORMAL HIGH (ref 70–99)

## 2022-04-08 LAB — MAGNESIUM: Magnesium: 1.8 mg/dL (ref 1.7–2.4)

## 2022-04-08 LAB — AMMONIA: Ammonia: 25 umol/L (ref 9–35)

## 2022-04-08 MED ORDER — ZINC OXIDE 12.8 % EX OINT
TOPICAL_OINTMENT | Freq: Two times a day (BID) | CUTANEOUS | Status: DC
  Administered 2022-04-09: 1 via TOPICAL
  Filled 2022-04-08: qty 56.7

## 2022-04-08 MED ORDER — INSULIN GLARGINE-YFGN 100 UNIT/ML ~~LOC~~ SOLN
8.0000 [IU] | Freq: Every day | SUBCUTANEOUS | Status: DC
  Administered 2022-04-09: 8 [IU] via SUBCUTANEOUS
  Filled 2022-04-08 (×2): qty 0.08

## 2022-04-08 NOTE — Plan of Care (Signed)
  Problem: Activity: Goal: Ability to return to baseline activity level will improve Outcome: Progressing   Problem: Safety: Goal: Ability to remain free from injury will improve Outcome: Progressing   Problem: Pain Managment: Goal: General experience of comfort will improve Outcome: Progressing

## 2022-04-08 NOTE — Evaluation (Signed)
Physical Therapy Evaluation Patient Details Name: Corey Wood MRN: [id number] DOB: Apr 09, 1950 Today's Date: 04/08/2022  History of Present Illness  Patient is 72 y.o. male with PMH for NASH cirrhosis + varices status post multiple banding, complicated by portal hypertension and hepatic hydrothorax--status post TIPS procedure 03/26/2022, DMII, HTN, CKDIII, AA, COPD, parkinsonism.   Clinical Impression  Corey Wood is 72 y.o. male admitted with above HPI and diagnosis. Patient is currently limited by functional impairments below (see PT problem list). Patient lives with his wife and is ~2 weeks PTA was mobility with RW independently at home and working with home therapist. Today pt was able to follow commands with increased time and more alert to location and situation. He required Mod assist with multimodal cues to complete bed mobility and 2+ assist for sit<>stand transfers. Pt unable to tolerate sitting in recliner due to soreness/skin breakdown on his bottom and returned to bed. RN and MD notified of peri-area skin breakdown and recommendation for topical cream to protect skin. Patient will benefit from continued skilled PT interventions to address impairments and progress independence with mobility, recommending ST rehab at [facility]. Acute PT will follow and progress as able.        Recommendations for follow up therapy are one component of a multi-disciplinary discharge planning process, led by the attending physician.  Recommendations may be updated based on patient status, additional functional criteria and insurance authorization.  Follow Up Recommendations Skilled nursing-short term rehab (<3 hours/day) Can patient physically be transported by private vehicle: No    Assistance Recommended at Discharge Frequent or constant Supervision/Assistance  Patient can return home with the following  Two people to help with walking and/or transfers;Two people to help with  bathing/dressing/bathroom;Assistance with cooking/housework;Assist for transportation;Help with stairs or ramp for entrance;Direct supervision/assist for medications management    Equipment Recommendations None recommended by PT  Recommendations for Other Services       Functional Status Assessment Patient has had a recent decline in their functional status and demonstrates the ability to make significant improvements in function in a reasonable and predictable amount of time.     Precautions / Restrictions Precautions Precautions: Fall Restrictions Weight Bearing Restrictions: No      Mobility  Bed Mobility Overal bed mobility: Needs Assistance Bed Mobility: Supine to Sit, Rolling, Sit to Supine Rolling: Mod assist   Supine to sit: Mod assist, HOB elevated Sit to supine: Mod assist   General bed mobility comments: Mod assist with multimodal cues to initiate rolling and reach for bed rail. Assist to bring LE's fully off EOB and raise trunk. pt able to use bed rail to start lowering trunk and mod assist to bring bil LE's onto bed at EOS.    Transfers Overall transfer level: Needs assistance Equipment used: Rolling walker (2 wheels) Transfers: Sit to/from Stand, Bed to chair/wheelchair/BSC Sit to Stand: Mod assist, +2 physical assistance, +2 safety/equipment, From elevated surface   Step pivot transfers: Mod assist, +2 physical assistance, +2 safety/equipment, From elevated surface       General transfer comment: Mod +2 to rise from EOB, blocking bil LE's to prevent anterior slide of feet. pt with flexed/crouched posture on rise. 2x sit<>stand from EOB, Mod +2 to guide RW and weight shift for steps to move bed>chair. 1x sit<>stand from reclien rfor pillow placement for comfort. Pt c/o sore bottom and 2+ mod assist provided to return to bed for comfort.    Ambulation/Gait  Stairs            Wheelchair Mobility    Modified Rankin (Stroke  Patients Only)       Balance Overall balance assessment: Needs assistance, History of Falls Sitting-balance support: Feet supported, Bilateral upper extremity supported Sitting balance-Leahy Scale: Fair     Standing balance support: Reliant on assistive device for balance, During functional activity, Bilateral upper extremity supported Standing balance-Leahy Scale: Poor                               Pertinent Vitals/Pain Pain Assessment Pain Assessment: Faces Faces Pain Scale: Hurts even more Pain Location: buttock with pericare Pain Descriptors / Indicators: Burning, Discomfort, Moaning Pain Intervention(s): Limited activity within patient's tolerance, Monitored during session, Repositioned, Other (comment) (requesting gerhadts butt cream for sore periarea)    Home Living Family/patient expects to be discharged to:: Private residence Living Arrangements: Spouse/significant other Available Help at Discharge: Family Type of Home: House Home Access: Ramped entrance       Home Layout: One level Home Equipment: Conservation officer, nature (2 wheels);Standard Walker;Rollator (4 wheels);BSC/3in1;Wheelchair - manual;Electric scooter;Hospital bed;Grab bars - tub/shower;Grab bars - toilet;Shower seat - built in (scooter lift for back of car) Additional Comments: wife is primary caregiver at home. has had HHPT and HHRN coming a couple times per week and has a home NT 1x/wk.    Prior Function Prior Level of Function : Needs assist;History of Falls (last six months)             Mobility Comments: 2 weeks PTA pt walking with  RW around house with [facility] therapist. Since procedure 12/29 pt has been more limited. ADLs Comments: Pt wears depends; Family assist with ADLs due to poor balance when leaning forward     Hand Dominance        Extremity/Trunk Assessment   Upper Extremity Assessment Upper Extremity Assessment: Generalized weakness    Lower Extremity Assessment Lower  Extremity Assessment: Generalized weakness    Cervical / Trunk Assessment Cervical / Trunk Assessment: Kyphotic  Communication   Communication: No difficulties  Cognition Arousal/Alertness: Awake/alert Behavior During Therapy: WFL for tasks assessed/performed Overall Cognitive Status: Impaired/Different from baseline Area of Impairment: Attention, Following commands, Awareness, Problem solving                   Current Attention Level: Sustained   Following Commands: Follows one step commands with increased time, Follows one step commands inconsistently   Awareness: Intellectual Problem Solving: Slow processing, Decreased initiation, Difficulty sequencing, Requires verbal cues, Requires tactile cues General Comments: pt more alert today but remains fatigued/lethargic        General Comments      Exercises     Assessment/Plan    PT Assessment Patient needs continued PT services  PT Problem List Decreased strength;Decreased mobility;Decreased activity tolerance;Decreased balance;Decreased cognition;Decreased knowledge of use of DME;Decreased safety awareness;Decreased knowledge of precautions;Pain;Decreased skin integrity       PT Treatment Interventions DME instruction;Gait training;Stair training;Functional mobility training;Therapeutic activities;Therapeutic exercise;Balance training;Neuromuscular re-education;Cognitive remediation;Patient/family education    PT Goals (Current goals can be found in the Care Plan section)  Acute Rehab PT Goals Patient Stated Goal: regain strength and independence PT Goal Formulation: With patient/family Time For Goal Achievement: 04/22/22 Potential to Achieve Goals: Fair    Frequency Min 3X/week     Co-evaluation               AM-PAC  PT "6 Clicks" Mobility  Outcome Measure Help needed turning from your back to your side while in a flat bed without using bedrails?: A Lot Help needed moving from lying on your back to  sitting on the side of a flat bed without using bedrails?: A Lot Help needed moving to and from a bed to a chair (including a wheelchair)?: A Lot Help needed standing up from a chair using your arms (e.g., wheelchair or bedside chair)?: A Lot Help needed to walk in hospital room?: Total Help needed climbing 3-5 steps with a railing? : Total 6 Click Score: 10    End of Session Equipment Utilized During Treatment: Gait belt Activity Tolerance: Patient tolerated treatment well;Patient limited by pain (bottom soreness prevented staing OOB) Patient left: in bed;with call bell/phone within reach;with nursing/sitter in room;with family/visitor present Nurse Communication: Mobility status (pericare needs and sore bottom) PT Visit Diagnosis: Muscle weakness (generalized) (M62.81);Difficulty in walking, not elsewhere classified (R26.2);Other abnormalities of gait and mobility (R26.89)    Time: 0737-1062 PT Time Calculation (min) (ACUTE ONLY): 48 min   Charges:   PT Evaluation $PT Eval Moderate Complexity: 1 Mod PT Treatments $Therapeutic Activity: 23-37 mins        Verner Mould, DPT Acute Rehabilitation Services Office [phone]  04/08/22 1:13 PM

## 2022-04-08 NOTE — NC FL2 (Signed)
MEDICAID FL2 LEVEL OF CARE FORM     IDENTIFICATION  Patient Name: Corey Wood Birthdate: 1950-12-23 Sex: male Admission Date (Current Location): 04/01/2022  [facility] and [place] Number:  Herbalist and Address:  [facility],  [place], [place]      Provider Number: [id number]  Attending Physician Name and Address:  Vernelle Emerald, MD  Relative Name and Phone Number:  Argil Mahl (spouse) Ph: [phone]    Current Level of Care: Hospital Recommended Level of Care: [place] Prior Approval Number:    Date Approved/Denied:   PASRR Number: [id number]  Discharge Plan: SNF    Current Diagnoses: Patient Active Problem List   Diagnosis Date Noted   Ileus ([place]) 04/07/2022   Goals of care, counseling/discussion 04/07/2022   S/P TIPS (transjugular intrahepatic portosystemic shunt) 04/02/2022   Hyperammonemia ([place]) 04/02/2022   Hepatic encephalopathy ([place]) 04/01/2022   Pleural effusion on left 04/01/2022   Ascending aortic aneurysm ([place]) 04/01/2022   Chronic left hydroureteronephrosis 04/01/2022   Parkinson's disease 04/01/2022   NASH (nonalcoholic steatohepatitis) 03/26/2022   Decompensation of cirrhosis of liver ([place]) 03/26/2022   Chronic obstructive pulmonary disease ([place]) 01/18/2022   Chronic kidney disease, stage 3a ([place]) 01/18/2022   Hematemesis 01/17/2022   Thrombocytopenia ([place])    Portal hypertension ([place])    Esophageal varices without bleeding ([place])    Pancytopenia ([place]) 10/14/2020   Hypokalemia 10/14/2020   Bleeding esophageal varices ([place])    Acute upper GI bleed 04/25/2020   Liver cirrhosis secondary to NASH [facility])    Age-related nuclear cataract of right eye 09/15/2016   Pseudophakia of left eye 09/15/2016   Intraoperative floppy iris syndrome (IFIS) 01/13/2016   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/13/2016   Acquired color vision deficiency 11/24/2015   Nuclear sclerosis of right eye 11/24/2015    Open angle with borderline findings and high glaucoma risk in both eyes 11/24/2015   Type 2 diabetes mellitus with stage 3a chronic kidney disease, with long-term current use of insulin ([place]) 11/24/2015    Orientation RESPIRATION BLADDER Height & Weight     Self  O2 (2L/min) Incontinent Weight: 160 lb 15 oz (73 kg) Height:  '5\' 6"'$  (167.6 cm)  BEHAVIORAL SYMPTOMS/MOOD NEUROLOGICAL BOWEL NUTRITION STATUS   (N/A)  (N/A) Incontinent Diet (Carb modified)  AMBULATORY STATUS COMMUNICATION OF NEEDS Skin   Extensive Assist Verbally (Patient's communication is currently limited due to hepatic encephalopathy.) Other (Comment) (Petechiae: face; Erythema: groin, buttocks; Ecchymosis: arm)                       Personal Care Assistance Level of Assistance  Bathing, Feeding, Dressing Bathing Assistance: Maximum assistance Feeding assistance: Limited assistance Dressing Assistance: Maximum assistance     Functional Limitations Info  Sight, Hearing, Speech Sight Info: Adequate Hearing Info: Adequate Speech Info: Adequate    SPECIAL CARE FACTORS FREQUENCY  PT (By licensed PT), OT (By licensed OT)     PT Frequency: 5x's/week OT Frequency: 5x's/week            Contractures Contractures Info: Not present    Additional Factors Info  Code Status, Allergies, Insulin Sliding Scale Code Status Info: DNR Allergies Info: Lisinopril   Insulin Sliding Scale Info: See discharge summary       Current Medications (04/08/2022):  This is the current hospital active medication list Current Facility-Administered Medications  Medication Dose Route Frequency Provider Last Rate Last Admin   albuterol (PROVENTIL) (2.5  MG/3ML) 0.083% nebulizer solution 2.5 mg  2.5 mg Inhalation Q6H PRN Lenore Cordia, MD       carbidopa-levodopa (SINEMET IR) 25-100 MG per tablet immediate release 1 tablet  1 tablet Oral TID Lenore Cordia, MD   1 tablet at 04/08/22 0840   feeding supplement (GLUCERNA SHAKE)  (GLUCERNA SHAKE) liquid 237 mL  237 mL Oral TID BM Vernelle Emerald, MD   237 mL at 04/08/22 1352   insulin aspart (novoLOG) injection 0-9 Units  0-9 Units Subcutaneous TID WC Lenore Cordia, MD   3 Units at 04/08/22 1203   insulin glargine-yfgn (SEMGLEE) injection 5 Units  5 Units Subcutaneous Daily Shalhoub, Sherryll Burger, MD   5 Units at 04/08/22 0841   lactulose (CHRONULAC) 10 GM/15ML solution 30 g  30 g Oral QID Mansouraty, Telford Nab., MD   30 g at 04/08/22 1350   midodrine (PROAMATINE) tablet 10 mg  10 mg Oral TID with meals Zada Finders R, MD   10 mg at 04/08/22 1202   montelukast (SINGULAIR) tablet 10 mg  10 mg Oral Daily Zada Finders R, MD   10 mg at 04/08/22 0840   ondansetron (ZOFRAN) tablet 4 mg  4 mg Oral Q6H PRN Lenore Cordia, MD       Or   ondansetron (ZOFRAN) injection 4 mg  4 mg Intravenous Q6H PRN Lenore Cordia, MD       pantoprazole (PROTONIX) EC tablet 40 mg  40 mg Oral Q breakfast Zada Finders R, MD   40 mg at 04/08/22 0839   rifaximin (XIFAXAN) tablet 550 mg  550 mg Oral BID Lenore Cordia, MD   550 mg at 04/08/22 0840   tamsulosin (FLOMAX) capsule 0.4 mg  0.4 mg Oral BID Zada Finders R, MD   0.4 mg at 04/08/22 2446   Zinc Oxide (TRIPLE PASTE) 12.8 % ointment   Topical BID Vernelle Emerald, MD   Given at 04/08/22 1202   zinc sulfate capsule 220 mg  220 mg Oral BID Mansouraty, Telford Nab., MD   220 mg at 04/08/22 2863     Discharge Medications: Please see discharge summary for a list of discharge medications.  Relevant Imaging Results:  Relevant Lab Results:   Additional Information SSN: [id number]  Sherie Don, LCSW

## 2022-04-08 NOTE — Progress Notes (Addendum)
[facility] Progress Note  CC:  decompensated cryptogenic cirrhosis status post TIPS 03/26/2022 subsequent failure to thrive and progressive encephalopathy   Subjective:  Sleepy at the time of my visit but he was up and working with PT this morning.  Wife is at bedside.  She said that PT had him up and was trying to work with him but they he had a diarrhea accident and had to get cleaned up.  Wife says that he is having a lot of BMs and says that his abdomen is much less distended.  Says that he ate his entire breakfast this morning.  Objective:  Vital signs in last 24 hours: Temp:  [97.6 F (36.4 C)-98.4 F (36.9 C)] 97.6 F (36.4 C) (01/11 0458) Pulse Rate:  [51-83] 51 (01/11 0458) Resp:  [16-18] 16 (01/11 0458) BP: (113-122)/(54-80) 122/54 (01/11 0458) SpO2:  [93 %-100 %] 100 % (01/11 0458) Weight:  [73 kg] 73 kg (01/11 0458) Last BM Date : 04/07/21 General:  Alert, responds and answers questions but sleepy and keeps eyes closed. Heart:  Slightly bradycardic; no murmurs Pulm:  CTAB.  No W/R/R. Abdomen:  Soft, minimal distended.  BS present.  Non-tender. Extremities:  Without edema. Neurologic:  Alert but sleepy.  Answers questions but keeps eyes closed.  Intake/Output from previous day: 01/10 0701 - 01/11 0700 In: 650.1 [I.V.:650.1] Out: 300 [Urine:300]  Lab Results: Recent Labs    04/06/22 0358 04/07/22 0343 04/08/22 0404  WBC 8.0 5.6 3.8*  HGB 12.2* 10.3* 10.0*  HCT 37.3* 31.6* 31.3*  PLT 108* 94* 76*   BMET Recent Labs    04/06/22 0358 04/07/22 0343 04/08/22 0404  NA 131* 133* 133*  K 3.3* 4.2 3.8  CL 97* 104 105  CO2 '24 22 22  '$ GLUCOSE 205* 256* 148*  BUN 24* 24* 19  CREATININE 1.33* 1.25* 1.00  CALCIUM 10.4* 9.8 9.4   LFT Recent Labs    04/08/22 0404  PROT 6.1*  ALBUMIN 2.3*  AST 41  ALT 15  ALKPHOS 91  BILITOT 1.2   Assessment / Plan: #23 72 year old white male with cryptogenic/probable [place] related cirrhosis  decompensated with history of recurrent GI bleeding, and hepatic hydrothorax now status post TIPS 03/26/2022-decompensation post TIPS with progressive encephalopathy.   Patient developed a diffuse ileus since admission, do not think he was absorbing the lactulose. Started lactulose enemas.  Ammonia level normal at 25 today.  Was up and attempting to work with PT earlier.   Lactulose enemas look like they were discontinued as of this AM. He is taking a good amount of PO so continue oral lactulose, Xifaxan twice daily, and zinc twice daily.  #2 Parkinson's disease-on Sinemet #3 diffuse ileus as above-abd  much softer today, will not repeat abdominal films unless he has recurrent distention.  Seems to be resolving.   #4 hypokalemia resolved #5 recent hypotension, on midodrine, was on every other day Aldactone 50 mg, but it looks like this was discontinued a couple of days ago. #6 adult onset diabetes mellitus #7 chronic kidney disease stage III #8 abdominal aortic aneurysm    LOS: 6 days   Laban Emperor. Zehr  04/08/2022, 11:51 AM  I have taken an interval history, thoroughly reviewed the chart and examined the patient. I agree with the Advanced Practitioner's note, impression and recommendations, and have recorded additional findings, impressions and recommendations below. I performed a substantive portion of this encounter (>50% time spent), including a complete performance of the medical  decision making.  My additional thoughts are as follows:  His ammonia level is decreasing and mental status has improved day by day.  He is more awake and interactive, though he remains confused.  His wife remains at the bedside and is glad to see his improvement.  Abdomen still softly distended and tympanitic, though he is having significant diarrhea output from the lactulose and able to eat without nausea or vomiting. Suspect a fair amount of this distention is related to high-dose  lactulose.  Lactulose enemas were discontinued, we will continue this current dose of oral lactulose along with rifaximin evaluating daily and decreasing the lactulose dose when we feel his mental status has sufficiently improved.  Renal function and electrolytes remain stable, CBC with pancytopenia stable.  (He probably does not need labs checked every day)  We will follow.  Dr. Havery Moros was updated yesterday at family's request.  Nelida Meuse III [phone]

## 2022-04-08 NOTE — TOC Progression Note (Signed)
Transition of Care [facility]) - Progression Note   Patient Details  Name: Corey Wood MRN: [id number] Date of Birth: 11-20-1950  Transition of [facility]) CM/SW [place], LCSW Phone Number: 04/08/2022, 3:14 PM  Clinical Narrative: PT evaluation recommended SNF. CSW spoke with patient's spouse regarding recommendations as patient is oriented x1. Wife agreeable to rehab. FL2 done; PASRR received. Initial referral faxed out. TOC awaiting bed offers.  Expected Discharge Plan: Skilled Nursing Facility Barriers to Discharge: SNF Pending bed offer, Insurance Authorization  Expected Discharge Plan and Services In-house Referral: Clinical Social Work Post Acute Care Choice: [place]  Social Determinants of Health (SDOH) Interventions SDOH Screenings   Food Insecurity: No Food Insecurity (04/02/2022)  Housing: Low Risk  (04/02/2022)  Transportation Needs: No Transportation Needs (04/02/2022)  Utilities: Not At Risk (04/02/2022)  Tobacco Use: Low Risk  (04/01/2022)   Readmission Risk Interventions    05/02/2020    2:41 PM  Readmission Risk Prevention Plan  Transportation Screening Complete  PCP or Specialist Appt within 3-5 Days Complete  HRI or [place] Complete  Social Work Consult for [place] Planning/Counseling Complete  Palliative Care Screening Not Applicable  Medication Review Press photographer) Referral to Pharmacy

## 2022-04-08 NOTE — Progress Notes (Signed)
PROGRESS NOTE   Corey Wood  YBO:[id number] DOB: [birth year] DOA: 04/01/2022 PCP: [facility]   Date of Service: the patient was seen and examined on 04/08/2022  Brief Narrative:  KENIEL Wood is a 72 y.o. male with medical history significant for cryptogenic cirrhosis thought secondary to NASH associated with portal hypertension, esophageal and gastric varices, recurrent ascites )s/p TIPS on 03/26/2022), hepatic hydrothorax, CKD stage IIIa, Parkinson's disease, chronic thrombocytopenia, insulin-dependent T2DM who is admitted with hepatic encephalopathy.   Assessment and Plan: * Hepatic encephalopathy (HCC) Slow clinical improvement with patient more conversational today and eating Overall prognosis continues to be guarded Continue lactulose and rifaximin  Ammonia continuing to downtrend Continuing lactulose  Continue Xifaxan twice daily there is no clinical evidence of infection or gastrointestinal bleeding Continue to monitor for clinical improvement  Liver cirrhosis secondary to NASH [facility]) Known history of Corey Wood cirrhosis complicated by gastric varices, recurrent gastrointestinal bleeding and chronic hepatic hydrothorax  Patient is status post TIPS procedure 03/26/2022  Temporarily holding spironolactone and Lasix due to hypotension in the setting of aggressive lactulose administration Continuing midodrine Gastroenterology following, their input is appreciated   Ileus ([place]) X-rays on 1/7 and 1/9 suggestive of ileus Despite this patient has been tolerating his diet  monitoring clinically for now  Type 2 diabetes mellitus with stage 3a chronic kidney disease, with long-term current use of insulin ([place]) Patient still exhibiting poor glycemic control Increasing Semglee to 8 units daily Accu-Cheks before every meal and nightly with sliding scale insulin Hemoglobin A1c 6.6% 01/18/2022  Chronic kidney disease, stage 3a (HCC) Creatinine currently near baseline   Input and output monitoring  Monitoring renal function and electrolytes with serial chemistries   Parkinson's disease Continue Sinemet.  Thrombocytopenia (HCC) Chronic thrombocytopenia secondary to portal hypertension.   No clinical evidence of bleeding  Monitoring platelet counts with serial CBCs   Chronic obstructive pulmonary disease (HCC) No evidence of COPD exacerbation this time As needed bronchodilator therapy for episodic shortness of breath and wheezing.   Goals of care, counseling/discussion Patient's prognosis is guarded secondary to advanced liver disease and persisting severe hepatic encephalopathy Wife confirms that patient is DNR Wife reports that patient "does not want to live like this" but is hopeful that patient will clinically improve in the next day or 2. decision has been made to have a goals of care discussion later in the hospitalization.  If patient is failing to clinically improve or is worsening wife would be interested in exploring alternative goals of care and palliative care options.  Chronic left hydroureteronephrosis Noted on CT imaging.  Ascending aortic aneurysm (HCC) 4.1 cm ascending aortic aneurysm seen on CT chest.  Annual imaging follow-up by CTA or MRI recommended.     Subjective:  Patient is notably less lethargic than yesterday.  Patient denies pain.  Patient states that he has no appetite.     Physical Exam:  Vitals:   04/07/22 2119 04/08/22 0458 04/08/22 1442 04/08/22 2102  BP: 113/80 (!) 122/54 111/61 126/60  Pulse: 70 (!) 51 80 72  Resp: '16 16 17   '$ Temp: 97.6 F (36.4 C) 97.6 F (36.4 C) 98.3 F (36.8 C) 99 F (37.2 C)  TempSrc:   Oral Oral  SpO2: 93% 100% 96% 96%  Weight:  73 kg    Height:         Constitutional: Lethargic rousable and oriented x 3.  No associated distress.   Skin: no rashes, no lesions, good skin turgor noted. Eyes:  Pupils are equally reactive to light.  No evidence of scleral icterus or  conjunctival pallor.  ENMT: Extremely dry mucous membranes noted.  Posterior pharynx clear of any exudate or lesions.   Respiratory: clear to auscultation bilaterally, no wheezing, no crackles. Normal respiratory effort. No accessory muscle use.  Cardiovascular: 3 out of 6 colic murmur noted.  Regular rate and rhythm.  Some distal bilateral lower extremity pitting edema.  2+ pedal pulses. No carotid bruits.  Abdomen: Abdomen is protuberant but soft and nontender.  No evidence of intra-abdominal masses.  Positive bowel sounds noted in all quadrants.   Musculoskeletal: No joint deformity upper and lower extremities. Good ROM, no contractures. Normal muscle tone.    Data Reviewed:  I have personally reviewed and interpreted labs, imaging.  Significant findings are   CBC: Recent Labs  Lab 04/05/22 0343 04/05/22 1050 04/06/22 0358 04/07/22 0343 04/08/22 0404  WBC 7.5 7.0 8.0 5.6 3.8*  NEUTROABS  --  5.5 6.1  --   --   HGB 12.2* 11.6* 12.2* 10.3* 10.0*  HCT 35.6* 35.4* 37.3* 31.6* 31.3*  MCV 91.3 94.1 94.9 96.3 98.7  PLT 107* 103* 108* 94* 76*   Basic Metabolic Panel: Recent Labs  Lab 04/05/22 0343 04/05/22 1050 04/06/22 0358 04/07/22 0343 04/08/22 0404  NA 134* 133* 131* 133* 133*  K 3.3* 3.6 3.3* 4.2 3.8  CL 99 98 97* 104 105  CO2 '26 26 24 22 22  '$ GLUCOSE 196* 279* 205* 256* 148*  BUN 21 22 24* 24* 19  CREATININE 1.43* 1.53* 1.33* 1.25* 1.00  CALCIUM 10.3 10.1 10.4* 9.8 9.4  MG  --   --  2.0  --  1.8   GFR: Estimated Creatinine Clearance: 59.4 mL/min (by C-G formula based on SCr of 1 mg/dL). Liver Function Tests: Recent Labs  Lab 04/04/22 0421 04/05/22 0343 04/06/22 0358 04/07/22 0343 04/08/22 0404  AST 75* 64* 56* 36 41  ALT '18 23 20 20 15  '$ ALKPHOS 121 114 122 96 91  BILITOT 2.3* 2.0* 2.2* 1.3* 1.2  PROT 8.6* 7.9 8.5* 6.5 6.1*  ALBUMIN 2.9* 2.8* 3.2* 2.2* 2.3*    Coagulation Profile: Recent Labs  Lab 04/02/22 0343 04/04/22 1021  INR 1.3* 1.4*       Code Status:  Full code.  Code status decision has been confirmed with: wife Family Communication: Plan of care discussed with wife at the bedside.   Severity of Illness:  The appropriate patient status for this patient is INPATIENT. Inpatient status is judged to be reasonable and necessary in order to provide the required intensity of service to ensure the patient's safety. The patient's presenting symptoms, physical exam findings, and initial radiographic and laboratory data in the context of their chronic comorbidities is felt to place them at high risk for further clinical deterioration. Furthermore, it is not anticipated that the patient will be medically stable for discharge from the hospital within 2 midnights of admission.   * I certify that at the point of admission it is my clinical judgment that the patient will require inpatient hospital care spanning beyond 2 midnights from the point of admission due to high intensity of service, high risk for further deterioration and high frequency of surveillance required.*  Time spent:  53 minutes  Author:  Vernelle Emerald MD  04/08/2022 9:59 PM

## 2022-04-09 ENCOUNTER — Encounter (HOSPITAL_COMMUNITY): Payer: Self-pay | Admitting: Cardiovascular Disease

## 2022-04-09 ENCOUNTER — Inpatient Hospital Stay (HOSPITAL_COMMUNITY): Payer: Non-veteran care

## 2022-04-09 DIAGNOSIS — K567 Ileus, unspecified: Secondary | ICD-10-CM | POA: Diagnosis not present

## 2022-04-09 DIAGNOSIS — K7290 Hepatic failure, unspecified without coma: Secondary | ICD-10-CM | POA: Diagnosis not present

## 2022-04-09 DIAGNOSIS — E1122 Type 2 diabetes mellitus with diabetic chronic kidney disease: Secondary | ICD-10-CM | POA: Diagnosis not present

## 2022-04-09 DIAGNOSIS — K7682 Hepatic encephalopathy: Secondary | ICD-10-CM | POA: Diagnosis not present

## 2022-04-09 DIAGNOSIS — K7460 Unspecified cirrhosis of liver: Secondary | ICD-10-CM | POA: Diagnosis not present

## 2022-04-09 DIAGNOSIS — Z95828 Presence of other vascular implants and grafts: Secondary | ICD-10-CM | POA: Diagnosis not present

## 2022-04-09 DIAGNOSIS — K7581 Nonalcoholic steatohepatitis (NASH): Secondary | ICD-10-CM | POA: Diagnosis not present

## 2022-04-09 LAB — GLUCOSE, CAPILLARY
Glucose-Capillary: 165 mg/dL — ABNORMAL HIGH (ref 70–99)
Glucose-Capillary: 184 mg/dL — ABNORMAL HIGH (ref 70–99)
Glucose-Capillary: 197 mg/dL — ABNORMAL HIGH (ref 70–99)
Glucose-Capillary: 139 mg/dL — ABNORMAL HIGH (ref 70–99)

## 2022-04-09 LAB — COMPREHENSIVE METABOLIC PANEL
ALT: 29 U/L (ref 0–44)
AST: 60 U/L — ABNORMAL HIGH (ref 15–41)
Albumin: 2.2 g/dL — ABNORMAL LOW (ref 3.5–5.0)
Alkaline Phosphatase: 95 U/L (ref 38–126)
Anion gap: 7 (ref 5–15)
BUN: 14 mg/dL (ref 8–23)
CO2: 21 mmol/L — ABNORMAL LOW (ref 22–32)
Calcium: 9.5 mg/dL (ref 8.9–10.3)
Chloride: 102 mmol/L (ref 98–111)
Creatinine, Ser: 0.95 mg/dL (ref 0.61–1.24)
GFR, Estimated: >60 mL/min (ref >60)
Glucose, Bld: 162 mg/dL — ABNORMAL HIGH (ref 70–99)
Potassium: 3.7 mmol/L (ref 3.5–5.1)
Sodium: 130 mmol/L — ABNORMAL LOW (ref 135–145)
Total Bilirubin: 1.2 mg/dL (ref 0.3–1.2)
Total Protein: 6.1 g/dL — ABNORMAL LOW (ref 6.5–8.1)

## 2022-04-09 LAB — CBC
HCT: 27.5 % — ABNORMAL LOW (ref 39.0–52.0)
Hemoglobin: 9.3 g/dL — ABNORMAL LOW (ref 13.0–17.0)
MCH: 32 pg (ref 26.0–34.0)
MCHC: 33.8 g/dL (ref 30.0–36.0)
MCV: 94.5 fL (ref 80.0–100.0)
Platelets: 83 10*3/uL — ABNORMAL LOW (ref 150–400)
RBC: 2.91 MIL/uL — ABNORMAL LOW (ref 4.22–5.81)
RDW: 16.7 % — ABNORMAL HIGH (ref 11.5–15.5)
WBC: 3.3 10*3/uL — ABNORMAL LOW (ref 4.0–10.5)
nRBC: 0 % (ref 0.0–0.2)

## 2022-04-09 LAB — MAGNESIUM: Magnesium: 1.7 mg/dL (ref 1.7–2.4)

## 2022-04-09 MED ORDER — LATANOPROST 0.005 % OP SOLN
1.0000 [drp] | Freq: Every day | OPHTHALMIC | Status: DC
  Filled 2022-04-09: qty 2.5

## 2022-04-09 MED ORDER — POLYVINYL ALCOHOL-POVIDONE PF 1.4-0.6 % OP SOLN: 1.0000 [drp] | Freq: Every day | OPHTHALMIC | Status: DC | PRN

## 2022-04-09 MED ORDER — TRAZODONE HCL 50 MG PO TABS
75.0000 mg | ORAL_TABLET | Freq: Every evening | ORAL | Status: DC | PRN
  Administered 2022-04-09: 75 mg via ORAL
  Filled 2022-04-09: qty 2

## 2022-04-09 MED ORDER — FLUTICASONE PROPIONATE 50 MCG/ACT NA SUSP: 1.0000 | Freq: Every day | NASAL | Status: DC | PRN

## 2022-04-09 MED ORDER — POLYVINYL ALCOHOL 1.4 % OP SOLN: 1.0000 [drp] | OPHTHALMIC | Status: DC | PRN

## 2022-04-09 MED ORDER — FUROSEMIDE 20 MG PO TABS
20.0000 mg | ORAL_TABLET | Freq: Every day | ORAL | Status: DC
  Administered 2022-04-10: 20 mg via ORAL
  Filled 2022-04-09: qty 1

## 2022-04-09 MED ORDER — DULOXETINE HCL 20 MG PO CPEP
40.0000 mg | ORAL_CAPSULE | Freq: Every day | ORAL | Status: DC
  Administered 2022-04-10: 40 mg via ORAL
  Filled 2022-04-09: qty 2

## 2022-04-09 MED ORDER — BUDESONIDE 0.25 MG/2ML IN SUSP
0.2500 mg | Freq: Two times a day (BID) | RESPIRATORY_TRACT | Status: DC
  Administered 2022-04-09: 0.25 mg via RESPIRATORY_TRACT
  Filled 2022-04-09 (×2): qty 2

## 2022-04-09 MED ORDER — FERROUS SULFATE 325 (65 FE) MG PO TABS
325.0000 mg | ORAL_TABLET | ORAL | Status: DC
  Administered 2022-04-09: 325 mg via ORAL
  Filled 2022-04-09: qty 1

## 2022-04-09 MED ORDER — ADULT MULTIVITAMIN W/MINERALS CH
1.0000 | ORAL_TABLET | Freq: Every day | ORAL | Status: DC
  Administered 2022-04-10: 1 via ORAL
  Filled 2022-04-09 (×2): qty 1

## 2022-04-09 MED ORDER — ZINC OXIDE 12.8 % EX OINT: TOPICAL_OINTMENT | Freq: Two times a day (BID) | CUTANEOUS | Status: DC

## 2022-04-09 MED ORDER — SPIRONOLACTONE 25 MG PO TABS
25.0000 mg | ORAL_TABLET | Freq: Every day | ORAL | Status: DC
  Administered 2022-04-10: 25 mg via ORAL
  Filled 2022-04-09: qty 1

## 2022-04-09 MED ORDER — INSULIN GLARGINE-YFGN 100 UNIT/ML ~~LOC~~ SOLN
10.0000 [IU] | Freq: Every day | SUBCUTANEOUS | Status: DC
  Administered 2022-04-10: 10 [IU] via SUBCUTANEOUS
  Filled 2022-04-09: qty 0.1

## 2022-04-09 NOTE — Plan of Care (Signed)
Problem: Education: Goal: Understanding of CV disease, CV risk reduction, and recovery process will improve Outcome: Progressing   Problem: Activity: Goal: Ability to return to baseline activity level will improve Outcome: Progressing   Problem: Coping: Goal: Ability to adjust to condition or change in health will improve Outcome: Progressing   Support provided to wife today through active listening. Updated Dr. Cyd Silence of wife's concerns regarding facility placement. SW also made aware and is working with wife toward a discharge plan. Ivan Anchors, RN 04/09/22 8:25 PM

## 2022-04-09 NOTE — Progress Notes (Signed)
Gown and blankets offered to patient throughout this shift. He and visitor at bedside verbalize his preference not to wear clothes. Patient supported with this preference. Ivan Anchors, RN 04/09/22 8:13 PM

## 2022-04-09 NOTE — Progress Notes (Addendum)
[facility] Progress Note  CC:  decompensated cryptogenic cirrhosis status post TIPS 03/26/2022 subsequent failure to thrive and progressive encephalopathy   Subjective:  Tried to work with PT again today but unfortunately that was terminated due to diarrhea.  Still not back to his baseline per his wife.  She says that he did not sleep all night because he was just restless.  Per wife and nursing, his bottom is raw and sore from all of the diarrhea/BMs.  Objective:  Vital signs in last 24 hours: Temp:  [97.8 F (36.6 C)-99 F (37.2 C)] 97.8 F (36.6 C) (01/12 0445) Pulse Rate:  [72-102] 102 (01/12 0445) Resp:  [16-17] 16 (01/12 0445) BP: (111-126)/(57-61) 111/57 (01/12 0445) SpO2:  [96 %-100 %] 100 % (01/12 0445) Weight:  [71.6 kg] 71.6 kg (01/12 0439) Last BM Date : 04/07/21 General:  Alert but sluggish; opens eyes and answers questions but tired. Heart:  Slightly tachy; no murmurs Pulm:  CTAB.  No W/R/R. Abdomen:  Soft, non-distended.  BS present.  Non-tender.  Extremities:  Without edema.  Intake/Output from previous day: 01/11 0701 - 01/12 0700 In: -  Out: 600 [Urine:600]  Lab Results: Recent Labs    04/07/22 0343 04/08/22 0404 04/09/22 0334  WBC 5.6 3.8* 3.3*  HGB 10.3* 10.0* 9.3*  HCT 31.6* 31.3* 27.5*  PLT 94* 76* 83*   BMET Recent Labs    04/07/22 0343 04/08/22 0404 04/09/22 0334  NA 133* 133* 130*  K 4.2 3.8 3.7  CL 104 105 102  CO2 22 22 21*  GLUCOSE 256* 148* 162*  BUN 24* 19 14  CREATININE 1.25* 1.00 0.95  CALCIUM 9.8 9.4 9.5   LFT Recent Labs    04/09/22 0334  PROT 6.1*  ALBUMIN 2.2*  AST 60*  ALT 29  ALKPHOS 95  BILITOT 1.2   CT HEAD WO CONTRAST (5MM)  Result Date: 04/09/2022 CLINICAL DATA:  72 year old male with hepatic encephalopathy. Abnormal ammonia level. Neurologic deficit. EXAM: CT HEAD WITHOUT CONTRAST TECHNIQUE: Contiguous axial images were obtained from the base of the skull through the vertex without  intravenous contrast. RADIATION DOSE REDUCTION: This exam was performed according to the departmental dose-optimization program which includes automated exposure control, adjustment of the mA and/or kV according to patient size and/or use of iterative reconstruction technique. COMPARISON:  Head CT 04/01/2022 and earlier. FINDINGS: Brain: Conspicuous basal ganglia dystrophic calcifications. No midline shift, ventriculomegaly, mass effect, evidence of mass lesion, intracranial hemorrhage or evidence of cortically based acute infarction. Patchy and confluent bilateral cerebral white matter hypodensity appears stable. No definite cortical encephalomalacia. Vascular: Calcified atherosclerosis at the skull base. No suspicious intracranial vascular hyperdensity. Incidental superior sagittal sinus arachnoid granulation (physiologic coronal image 26). Skull: No acute osseous abnormality identified. Sinuses/Orbits: Visualized paranasal sinuses and mastoids are clear. Other: No acute orbit or scalp soft tissue finding. IMPRESSION: 1. No acute intracranial abnormality. 2. Stable non contrast CT appearance of chronic small vessel disease. Electronically Signed   By: Genevie Ann M.D.   On: 04/09/2022 08:23    Assessment / Plan: #65 72 year old white male with cryptogenic/probable [name] related cirrhosis decompensated with history of recurrent GI bleeding, and hepatic hydrothorax now status post TIPS 03/26/2022-decompensation post TIPS with progressive encephalopathy.   Patient developed a diffuse ileus since admission, do not think he was absorbing the lactulose. Started lactulose enemas.   Ammonia level normal at 25 yesterday.  Was up and attempting to work with PT earlier.   Lactulose enemas  look like they were discontinued 1/11 AM. He is taking a good amount of PO so continue oral lactulose, Xifaxan twice daily, and zinc twice daily.   #2 Parkinson's disease-on Sinemet #3 diffuse ileus as above-abd  much softer today,  will not repeat abdominal films unless he has recurrent distention.  Seems to be resolving.   #4 hypokalemia resolved #5 recent hypotension, on midodrine, was on every other day Aldactone 50 mg, but it looks like this was discontinued a couple of days ago. #6 adult onset diabetes mellitus #7 chronic kidney disease stage III #8 abdominal aortic aneurysm  -May need to consider revisit from palliative as he does not appear to be returning to his baseline.  Bottom is sore/raw from all of the diarrhea/BMs.    LOS: 7 days   Corey Wood. Zehr  04/09/2022, 12:30 PM  I have taken an interval history, thoroughly reviewed the chart and examined the patient. I agree with the Advanced Practitioner's note, impression and recommendations, and have recorded additional findings, impressions and recommendations below. I performed a substantive portion of this encounter (>50% time spent), including a complete performance of the medical decision making.  My additional thoughts are as follows:  Abdominal exam stable, mild soft distention and tympany, normal bowel sounds, eating well, so I believe the ileus component of this has resolved.  The residual findings on exam are likely from the high-dose lactulose he is receiving.  In my opinion, his mental status has plateaued for about the last 48 hours.  Since his ammonia normalized, this suggests he has residual delirium from this acute illness in a patient with underlying Parkinson's, possibly exacerbated by persistent hyponatremia.  There appear to be potential plans for discharge to skilled nursing facility in the next couple of days, though his wife seems to prefer taking him home.  It appears unlikely he could be at home right now unless he had 24-hour care.  I believe we have maximized the treatment of his encephalopathy and controlled at best we can as evidenced by normalized ammonia level.  Time will tell, but I am concerned this is possibly as good as his  mental status may get.  Chance at short-term rehab therefore seems reasonable to see where things are going in the next couple of weeks.  I will continue rifaximin and zinc at the current doses, and I will decrease the lactulose from 4 times daily to 3 times daily at the current dose.  This can be further titrated down in dose or frequency by his providers to achieve a goal of 2-3 soft bowel movements per day.  Number of daily BMs is not being accurately recorded by nursing, having only noted 1 episode yesterday while his wife says he has 4-5, nearly every time he eats or receives lactulose. That is probably contributing to his hyponatremia from volume loss, and it has apparently caused significant skin irritation on the buttocks which would put him at risk for skin breakdown and secondary infection.  I will leave signout for weekend coverage (Dr. Benson Norway) to see him as needed if urgent issues arise.   Nelida Meuse III [phone]

## 2022-04-09 NOTE — Progress Notes (Signed)
PROGRESS NOTE   Corey Wood  OZH:[id number] DOB: 1950/07/17 DOA: 04/01/2022 PCP: [facility]   Date of Service: the patient was seen and examined on 04/09/2022  Brief Narrative:  Corey Wood is a 71 y.o. male with medical history significant for cryptogenic cirrhosis thought secondary to NASH associated with portal hypertension, esophageal and gastric varices, recurrent ascites )s/p TIPS on 03/26/2022), hepatic hydrothorax, CKD stage IIIa, Parkinson's disease, chronic thrombocytopenia, insulin-dependent T2DM who is admitted with hepatic encephalopathy.   Assessment and Plan: * Hepatic encephalopathy ([place]) Minimal change compared to yesterday, it seems that patient's neurologic improvement has somewhat plateaued. prognosis continues to be guarded Continue lactulose and rifaximin  Ammonia level has now normalized there is no clinical evidence of infection or gastrointestinal bleeding We will continue to treat and monitor closely during this hospitalization to help determine whether patient will be more appropriate for disposition home with home health services versus a skilled nursing facility.  Liver cirrhosis secondary to NASH ([place]) Known history of [name] cirrhosis complicated by gastric varices, recurrent gastrointestinal bleeding and chronic hepatic hydrothorax  Patient is status post TIPS procedure 03/26/2022  Resuming a low-dose regimen of spironolactone and Lasix. Continuing midodrine Gastroenterology following, their input is appreciated   Ileus ([place]) X-rays on 1/7 and 1/9 suggestive of ileus Despite this patient has been tolerating his diet  monitoring clinically for now  Type 2 diabetes mellitus with stage 3a chronic kidney disease, with long-term current use of insulin ([place]) Patient still glycemic control is above target. Increasing Semglee to 10 units daily Accu-Cheks before every meal and nightly with sliding scale insulin Hemoglobin A1c 6.6%  01/18/2022  Chronic kidney disease, stage 3a (HCC) Creatinine currently near baseline  Input and output monitoring  Monitoring renal function and electrolytes with serial chemistries   Parkinson's disease Continue Sinemet.  Thrombocytopenia (HCC) Chronic thrombocytopenia secondary to portal hypertension.   No clinical evidence of bleeding  Monitoring platelet counts with serial CBCs   Chronic obstructive pulmonary disease (HCC) No evidence of COPD exacerbation this time As needed bronchodilator therapy for episodic shortness of breath and wheezing.   Goals of care, counseling/discussion Patient's prognosis is guarded secondary to advanced liver disease and persisting severe hepatic encephalopathy Wife confirms that patient is DNR With some clinical improvement patient's wife now is seemingly reluctant to proceed with any palliative measures. Will continue to revisit goals of care throughout hospitalization. Palliative care following, their input is appreciated.  Ascending aortic aneurysm (HCC) 4.1 cm ascending aortic aneurysm seen on CT chest.  Annual imaging follow-up by CTA or MRI recommended.  Chronic left hydroureteronephrosis Noted on CT imaging.     Subjective:  Family reports the patient is eating more than yesterday.  Per my discussions with the patient he has been unable to answer questions appropriately due to continued encephalopathy.  Patient denies pain.  Patient states that he wants to go home.   Physical Exam:  Vitals:   04/08/22 2102 04/09/22 0439 04/09/22 0445 04/09/22 1302  BP: 126/60  (!) 111/57 113/68  Pulse: 72  (!) 102 75  Resp:   16 18  Temp: 99 F (37.2 C)  97.8 F (36.6 C) 97.9 F (36.6 C)  TempSrc: Oral     SpO2: 96%  100% 94%  Weight:  71.6 kg    Height:         Constitutional: Lethargic rousable and oriented x 3.  No associated distress.   Skin: no rashes, no lesions, good skin turgor noted.  Eyes: Pupils are equally reactive to  light.  Some scleral icterus noted. ENMT: Extremely dry mucous membranes noted.  Posterior pharynx clear of any exudate or lesions.   Respiratory: clear to auscultation bilaterally, no wheezing, no crackles. Normal respiratory effort. No accessory muscle use.  Cardiovascular: 3 out of 6 systolic murmur noted.  Regular rate and rhythm.  Some distal bilateral lower extremity pitting edema.  2+ pedal pulses. No carotid bruits.  Abdomen: Abdomen is protuberant but soft and nontender.  No evidence of intra-abdominal masses.  Positive bowel sounds noted in all quadrants.   Musculoskeletal: No joint deformity upper and lower extremities. Good ROM, no contractures. Normal muscle tone.    Data Reviewed:  I have personally reviewed and interpreted labs, imaging.  Significant findings are   CBC: Recent Labs  Lab 04/05/22 1050 04/06/22 0358 04/07/22 0343 04/08/22 0404 04/09/22 0334  WBC 7.0 8.0 5.6 3.8* 3.3*  NEUTROABS 5.5 6.1  --   --   --   HGB 11.6* 12.2* 10.3* 10.0* 9.3*  HCT 35.4* 37.3* 31.6* 31.3* 27.5*  MCV 94.1 94.9 96.3 98.7 94.5  PLT 103* 108* 94* 76* 83*   Basic Metabolic Panel: Recent Labs  Lab 04/05/22 1050 04/06/22 0358 04/07/22 0343 04/08/22 0404 04/09/22 0334  NA 133* 131* 133* 133* 130*  K 3.6 3.3* 4.2 3.8 3.7  CL 98 97* 104 105 102  CO2 '26 24 22 22 '$ 21*  GLUCOSE 279* 205* 256* 148* 162*  BUN 22 24* 24* 19 14  CREATININE 1.53* 1.33* 1.25* 1.00 0.95  CALCIUM 10.1 10.4* 9.8 9.4 9.5  MG  --  2.0  --  1.8 1.7   GFR: Estimated Creatinine Clearance: 62.5 mL/min (by C-G formula based on SCr of 0.95 mg/dL). Liver Function Tests: Recent Labs  Lab 04/05/22 0343 04/06/22 0358 04/07/22 0343 04/08/22 0404 04/09/22 0334  AST 64* 56* 36 41 60*  ALT '23 20 20 15 29  '$ ALKPHOS 114 122 96 91 95  BILITOT 2.0* 2.2* 1.3* 1.2 1.2  PROT 7.9 8.5* 6.5 6.1* 6.1*  ALBUMIN 2.8* 3.2* 2.2* 2.3* 2.2*    Coagulation Profile: Recent Labs  Lab 04/04/22 1021  INR 1.4*       Code Status:  DNR.  Code status decision has been confirmed with: wife Family Communication: Plan of care discussed with wife at the bedside.   Severity of Illness:  The appropriate patient status for this patient is INPATIENT. Inpatient status is judged to be reasonable and necessary in order to provide the required intensity of service to ensure the patient's safety. The patient's presenting symptoms, physical exam findings, and initial radiographic and laboratory data in the context of their chronic comorbidities is felt to place them at high risk for further clinical deterioration. Furthermore, it is not anticipated that the patient will be medically stable for discharge from the hospital within 2 midnights of admission.   * I certify that at the point of admission it is my clinical judgment that the patient will require inpatient hospital care spanning beyond 2 midnights from the point of admission due to high intensity of service, high risk for further deterioration and high frequency of surveillance required.*  Time spent:  35 minutes  Author:  Vernelle Emerald MD  04/09/2022 8:44 PM

## 2022-04-09 NOTE — Progress Notes (Signed)
Physical Therapy Treatment Patient Details Name: Corey Wood MRN: [id number] DOB: 07/29/1950 Today's Date: 04/09/2022   History of Present Illness Patient is 72 y.o. male with PMH for NASH cirrhosis + varices status post multiple banding, complicated by portal hypertension and hepatic hydrothorax--status post TIPS procedure 03/26/2022, DMII, HTN, CKDIII, AA, COPD, parkinsonism.    PT Comments    Pt assisted with sitting EOB.  Pt required UE support and trunk support at times with only sitting EOB, so utilized [employer] equipment to attempt standing (wife reports pt has been trying to get OOB earlier).  Pt assisted to standing and had loose BM.  NT assisted with pericare and pt performed sit to stands x3 total for pericare and changing linen.  Continue to recommend SNF upon d/c.    Recommendations for follow up therapy are one component of a multi-disciplinary discharge planning process, led by the attending physician.  Recommendations may be updated based on patient status, additional functional criteria and insurance authorization.  Follow Up Recommendations  Skilled nursing-short term rehab (<3 hours/day) Can patient physically be transported by private vehicle: No   Assistance Recommended at Discharge Frequent or constant Supervision/Assistance  Patient can return home with the following Two people to help with walking and/or transfers;Two people to help with bathing/dressing/bathroom;Assistance with cooking/housework;Assist for transportation;Help with stairs or ramp for entrance;Direct supervision/assist for medications management   Equipment Recommendations  None recommended by PT    Recommendations for Other Services       Precautions / Restrictions Precautions Precautions: Fall Precaution Comments: incontinent     Mobility  Bed Mobility Overal bed mobility: Needs Assistance Bed Mobility: Rolling, Sidelying to Sit, Sit to Sidelying Rolling: Mod assist Sidelying to sit: Max  assist, HOB elevated     Sit to sidelying: +2 for safety/equipment, Max assist General bed mobility comments: multimodal cues for technique, log roll due to periarea sensitivity and pain; required significant assist for upper and lower body, utilized bed positioning to assist    Transfers Overall transfer level: Needs assistance   Transfers: Sit to/from Stand Sit to Stand: Mod assist, Max assist, +2 physical assistance           General transfer comment: utilized stedy equipment to assist with safely standing, pt performed standing x3 (had loose BM after just having been cleaned prior to arrival so required assist for hyiene and linen change); pt able to stand and hold approx one minute prior to fatigue    Ambulation/Gait                   Stairs             Wheelchair Mobility    Modified Rankin (Stroke Patients Only)       Balance Overall balance assessment: Needs assistance, History of Falls         Standing balance support: Reliant on assistive device for balance, During functional activity, Bilateral upper extremity supported Standing balance-Leahy Scale: Zero                              Cognition Arousal/Alertness: Awake/alert Behavior During Therapy: WFL for tasks assessed/performed Overall Cognitive Status: Impaired/Different from baseline Area of Impairment: Attention, Following commands, Awareness, Problem solving                   Current Attention Level: Sustained   Following Commands: Follows one step commands with increased time, Follows one step commands  inconsistently   Awareness: Intellectual Problem Solving: Slow processing, Decreased initiation, Difficulty sequencing, Requires verbal cues, Requires tactile cues          Exercises      General Comments        Pertinent Vitals/Pain Pain Assessment Pain Assessment: Faces Faces Pain Scale: Hurts even more Pain Location: buttock with pericare Pain  Descriptors / Indicators: Burning, Discomfort, Moaning Pain Intervention(s): Repositioned, Monitored during session    Home Living                          Prior Function            PT Goals (current goals can now be found in the care plan section) Progress towards PT goals: Progressing toward goals    Frequency    Min 2X/week      PT Plan Current plan remains appropriate    Co-evaluation              AM-PAC PT "6 Clicks" Mobility   Outcome Measure  Help needed turning from your back to your side while in a flat bed without using bedrails?: A Lot Help needed moving from lying on your back to sitting on the side of a flat bed without using bedrails?: A Lot Help needed moving to and from a bed to a chair (including a wheelchair)?: Total Help needed standing up from a chair using your arms (e.g., wheelchair or bedside chair)?: Total Help needed to walk in hospital room?: Total Help needed climbing 3-5 steps with a railing? : Total 6 Click Score: 8    End of Session Equipment Utilized During Treatment: Gait belt Activity Tolerance: Patient tolerated treatment well Patient left: in bed;with call bell/phone within reach;with bed alarm set;with family/visitor present Nurse Communication: Mobility status PT Visit Diagnosis: Muscle weakness (generalized) (M62.81);Difficulty in walking, not elsewhere classified (R26.2);Other abnormalities of gait and mobility (R26.89)     Time: 1856-3149 PT Time Calculation (min) (ACUTE ONLY): 23 min  Charges:  $Therapeutic Activity: 23-37 mins                     Jannette Spanner PT, DPT Physical Therapist Acute Rehabilitation Services Preferred contact method: Secure Chat Weekend Pager Only: [phone] Office: [place] 04/09/2022, 1:44 PM

## 2022-04-09 NOTE — Care Management Important Message (Signed)
Important Message  Patient Details  Name: Corey Wood MRN: [id number] Date of Birth: 10/08/1950   Medicare Important Message Given:  Yes     Memory Argue 04/09/2022, 10:46 AM

## 2022-04-09 NOTE — TOC Progression Note (Signed)
Transition of Care [facility]) - Progression Note   Patient Details  Name: TILTON MARSALIS MRN: [id number] Date of Birth: May 11, 1950  Transition of [facility]) CM/SW [place], LCSW Phone Number: 04/09/2022, 3:34 PM  Clinical Narrative: CSW met with patient and wife to review SNF bed offers. Wife reported that she would prefer to take the patient home with [facility] (patient is active with [facility] for PT, OT, and RN) and has adequate assistance at home from family. Patient was ambulatory prior to admission, however, he has not been able to ambulate with PT today. CSW discussed PT's recommendations with wife, but wife is still considering HH at this time. CSW updated hospitalist.  Expected Discharge Plan: Skilled Nursing Facility Barriers to Discharge: SNF Pending bed offer, Insurance Authorization  Expected Discharge Plan and Services In-house Referral: Clinical Social Work Post Acute Care Choice: [place]  Social Determinants of Health (SDOH) Interventions SDOH Screenings   Food Insecurity: No Food Insecurity (04/02/2022)  Housing: Low Risk  (04/02/2022)  Transportation Needs: No Transportation Needs (04/02/2022)  Utilities: Not At Risk (04/02/2022)  Tobacco Use: Low Risk  (04/09/2022)   Readmission Risk Interventions    05/02/2020    2:41 PM  Readmission Risk Prevention Plan  Transportation Screening Complete  PCP or Specialist Appt within 3-5 Days Complete  HRI or [place] Complete  Social Work Consult for [place] Planning/Counseling Complete  Palliative Care Screening Not Applicable  Medication Review [employer]) Referral to Pharmacy

## 2022-04-10 ENCOUNTER — Other Ambulatory Visit (HOSPITAL_COMMUNITY): Payer: Self-pay

## 2022-04-10 DIAGNOSIS — K7581 Nonalcoholic steatohepatitis (NASH): Secondary | ICD-10-CM | POA: Diagnosis not present

## 2022-04-10 DIAGNOSIS — K7682 Hepatic encephalopathy: Secondary | ICD-10-CM | POA: Diagnosis not present

## 2022-04-10 DIAGNOSIS — E1122 Type 2 diabetes mellitus with diabetic chronic kidney disease: Secondary | ICD-10-CM | POA: Diagnosis not present

## 2022-04-10 DIAGNOSIS — N1831 Chronic kidney disease, stage 3a: Secondary | ICD-10-CM | POA: Diagnosis not present

## 2022-04-10 LAB — COMPREHENSIVE METABOLIC PANEL
ALT: 21 U/L (ref 0–44)
AST: 63 U/L — ABNORMAL HIGH (ref 15–41)
Albumin: 2.5 g/dL — ABNORMAL LOW (ref 3.5–5.0)
Alkaline Phosphatase: 101 U/L (ref 38–126)
Anion gap: 6 (ref 5–15)
BUN: 12 mg/dL (ref 8–23)
CO2: 23 mmol/L (ref 22–32)
Calcium: 9.8 mg/dL (ref 8.9–10.3)
Chloride: 103 mmol/L (ref 98–111)
Creatinine, Ser: 0.92 mg/dL (ref 0.61–1.24)
GFR, Estimated: >60 mL/min (ref >60)
Glucose, Bld: 145 mg/dL — ABNORMAL HIGH (ref 70–99)
Potassium: 3.7 mmol/L (ref 3.5–5.1)
Sodium: 132 mmol/L — ABNORMAL LOW (ref 135–145)
Total Bilirubin: 1.3 mg/dL — ABNORMAL HIGH (ref 0.3–1.2)
Total Protein: 6.8 g/dL (ref 6.5–8.1)

## 2022-04-10 LAB — CBC WITH DIFFERENTIAL/PLATELET
Abs Immature Granulocytes: 0.02 10*3/uL (ref 0.00–0.07)
Basophils Absolute: 0 10*3/uL (ref 0.0–0.1)
Basophils Relative: 1 %
Eosinophils Absolute: 0.1 10*3/uL (ref 0.0–0.5)
Eosinophils Relative: 5 %
HCT: 32.6 % — ABNORMAL LOW (ref 39.0–52.0)
Hemoglobin: 10.7 g/dL — ABNORMAL LOW (ref 13.0–17.0)
Immature Granulocytes: 1 %
Lymphocytes Relative: 19 %
Lymphs Abs: 0.5 10*3/uL — ABNORMAL LOW (ref 0.7–4.0)
MCH: 31.4 pg (ref 26.0–34.0)
MCHC: 32.8 g/dL (ref 30.0–36.0)
MCV: 95.6 fL (ref 80.0–100.0)
Monocytes Absolute: 0.2 10*3/uL (ref 0.1–1.0)
Monocytes Relative: 8 %
Neutro Abs: 1.8 10*3/uL (ref 1.7–7.7)
Neutrophils Relative %: 66 %
Platelets: 87 10*3/uL — ABNORMAL LOW (ref 150–400)
RBC: 3.41 MIL/uL — ABNORMAL LOW (ref 4.22–5.81)
RDW: 17 % — ABNORMAL HIGH (ref 11.5–15.5)
WBC: 2.7 10*3/uL — ABNORMAL LOW (ref 4.0–10.5)
nRBC: 0 % (ref 0.0–0.2)

## 2022-04-10 LAB — GLUCOSE, CAPILLARY
Glucose-Capillary: 151 mg/dL — ABNORMAL HIGH (ref 70–99)
Glucose-Capillary: 169 mg/dL — ABNORMAL HIGH (ref 70–99)

## 2022-04-10 LAB — MAGNESIUM: Magnesium: 1.9 mg/dL (ref 1.7–2.4)

## 2022-04-10 LAB — AMMONIA: Ammonia: 24 umol/L (ref 9–35)

## 2022-04-10 MED ORDER — ZINC OXIDE 12.8 % EX OINT
TOPICAL_OINTMENT | Freq: Two times a day (BID) | CUTANEOUS | 2 refills | Status: DC
  Filled 2022-04-10 – 2022-04-15 (×2): qty 56.7, fill #0

## 2022-04-10 MED ORDER — LACTULOSE 10 GM/15ML PO SOLN
30.0000 g | Freq: Three times a day (TID) | ORAL | 2 refills | Status: DC
  Filled 2022-04-10: qty 946, 7d supply, fill #0
  Filled 2022-04-15: qty 946, 7d supply, fill #1

## 2022-04-10 MED ORDER — SPIRONOLACTONE 25 MG PO TABS
25.0000 mg | ORAL_TABLET | Freq: Every day | ORAL | 1 refills | Status: DC
  Filled 2022-04-10: qty 14, 14d supply, fill #0
  Filled 2022-04-15: qty 14, 14d supply, fill #1

## 2022-04-10 MED ORDER — LACTULOSE 10 GM/15ML PO SOLN
30.0000 g | Freq: Four times a day (QID) | ORAL | 2 refills | Status: DC
  Filled 2022-04-10: qty 946, 6d supply, fill #0

## 2022-04-10 NOTE — Plan of Care (Signed)
Discharge instructions given to the patient and his wife including medications.

## 2022-04-10 NOTE — Discharge Summary (Signed)
Physician Discharge Summary   Patient: Corey Wood MRN: [id number] DOB: [birth year]  Admit date:     04/01/2022  Discharge date: 04/10/22  Discharge Physician: Vernelle Emerald   PCP: [facility] [place]   Recommendations at discharge:   Patient has been instructed to  take all prescribed medications exactly as instructed including his regimen of lactulose and rifaximin Patient instructed to take his lactulose as instructed to achieve at least 3 loose stools daily Patient is to do to consume a low-sodium low carbohydrate diet Patient to only get out of bed with maximum assistance using an assistive device. Home health physical therapy and nursing have been resumed at time of discharge  Instructed to follow-up with his primary care provider in 1 week as well as [facility] [facility] in 2 to 4 weeks. Patient is to to return the emergency department if he develops worsening abdominal pain confusion fever or inability to tolerate oral intake.  Discharge Diagnoses: Principal Problem:   Hepatic encephalopathy ([place]) Active Problems:   Liver cirrhosis secondary to NASH (HCC)   Ileus (HCC)   Type 2 diabetes mellitus with stage 3a chronic kidney disease, with long-term current use of insulin (HCC)   Chronic kidney disease, stage 3a (HCC)   Parkinson's disease   Thrombocytopenia (HCC)   Chronic obstructive pulmonary disease (HCC)   Goals of care, counseling/discussion   Ascending aortic aneurysm ([place])   Chronic left hydroureteronephrosis   Portal hypertension (HCC)   Decompensation of cirrhosis of liver (HCC)   Pleural effusion on left   S/P TIPS (transjugular intrahepatic portosystemic shunt)   Hyperammonemia ([place])  Resolved Problems:   Acute on chronic renal insufficiency   Hospital Course: Corey Wood is a cryptogenic cirrhosis thought secondary to NASH associated with portal hypertension, esophageal and gastric varices, recurrent ascites )s/p TIPS on 03/26/2022),  hepatic hydrothorax, CKD stage IIIa, Parkinson's disease, chronic thrombocytopenia, insulin-dependent T2DM who presented to [facility] emergency department with progressively worsening confusion.   Of note, the patient recently underwent TIPS procedure on 03/26/2022.  Upon evaluation in the emergency department patient was found to have a markedly elevated ammonia level of 99.  Clinically the patient appears to be suffering from recurrent hepatic encephalopathy.  The hospitalist group was then called to assess the patient for admission to the hospital.  Patient was admitted to hospital service and placed back on an aggressive regimen of both oral and rectal lactulose in addition to rifaximin therapy.  Dr. Havery Moros with gastroenterology was additionally consulted.  Patient was extremely lethargic the first several days with concerns for overall guarded and even poor prognosis.  Palliative care team was additionally consulted.  Goals of care discussions with the wife at the bedside were undertaken with wife reluctant to pursue palliative measures or hospice discussion unless the patient was failing to improve.  In the days that followed patient did slowly clinically improve.  Patient became more awake and alert, was able to participate in eating and conversation near his baseline.  Physical therapy did evaluate the patient and recommended skilled physical therapy services in a skilled nursing facility.  After the wife had researched potential skilled nursing facilities in the surrounding area she was not satisfied with what was available and voiced her desire for the patient to be discharged home instead with home health services.  Patient continued to clinically improve until he plateaued.  Even at this point, it is our medical opinion that the patient would best be served in a skilled  nursing facility where the patient would receive 24-hour supervision considering his debilitated state.  The  wife and patient both insisted that they wish to go home with home health services instead.  Patient has now been discharged home on 04/10/2022 in improved and stable condition.    Pain control - [employer] Controlled Substance Reporting System database was reviewed. and patient was instructed, not to drive, operate heavy machinery, perform activities at heights, swimming or participation in water activities or provide baby-sitting services while on Pain, Sleep and Anxiety Medications; until their outpatient Physician has advised to do so again. Also recommended to not to take more than prescribed Pain, Sleep and Anxiety Medications.   Consultants: Dr. Havery Moros with GI, Dr. Rowe Pavy with Palliative Care Procedures performed: None  Disposition: Home health Diet recommendation:  Discharge Diet Orders (From admission, onward)     Start     Ordered   04/10/22 0000  Diet - low sodium heart healthy        04/10/22 1343           Carb modified diet  DISCHARGE MEDICATION: Allergies as of 04/10/2022       Reactions   Lisinopril Cough        Medication List     STOP taking these medications    buPROPion 150 MG 24 hr tablet Commonly known as: WELLBUTRIN XL   meclizine 25 MG tablet Commonly known as: ANTIVERT       TAKE these medications    acetaminophen 325 MG tablet Commonly known as: TYLENOL Take 325 mg by mouth every 6 (six) hours as needed for moderate pain.   albuterol 108 (90 Base) MCG/ACT inhaler Commonly known as: VENTOLIN HFA Inhale 1-2 puffs into the lungs every 6 (six) hours as needed for wheezing or shortness of breath.   Asmanex HFA 100 MCG/ACT Aero Generic drug: Mometasone Furoate Inhale 1 puff into the lungs daily.   carbidopa-levodopa 25-100 MG tablet Commonly known as: SINEMET IR Take 1.5 tablets by mouth in the morning, at noon, and at bedtime.   CODEINE SULFATE PO Take 5 mLs by mouth at bedtime.   DIABETIC TUSSIN EX 100 MG/5ML  liquid Generic drug: guaiFENesin Take 10 mLs by mouth at bedtime.   DULoxetine 20 MG capsule Commonly known as: CYMBALTA Take 40 mg by mouth daily.   ferrous sulfate 325 (65 FE) MG EC tablet Take 325 mg by mouth 3 (three) times a week. MWF   fluticasone 50 MCG/ACT nasal spray Commonly known as: FLONASE Place 1 spray into both nostrils daily as needed for allergies.   furosemide 20 MG tablet Commonly known as: LASIX Take 20 mg by mouth daily.   insulin glargine 100 unit/mL Sopn Commonly known as: LANTUS Inject 16 Units into the skin daily.   ipratropium 0.03 % nasal spray Commonly known as: ATROVENT Place 1 spray into both nostrils at bedtime.   lactulose 10 GM/15ML solution Commonly known as: CHRONULAC Take 45 mLs (30 g total) by mouth 3 (three) times daily. Hold this medication if you have had greater than three loose stools in one day.   latanoprost 0.005 % ophthalmic solution Commonly known as: XALATAN Place 1 drop into both eyes at bedtime.   lidocaine 5 % Commonly known as: LIDODERM Place 1 patch onto the skin daily as needed (pain). Remove & Discard patch within 12 hours or as directed by MD   loratadine 10 MG tablet Commonly known as: CLARITIN Take 10 mg by mouth daily.  midodrine 10 MG tablet Commonly known as: PROAMATINE Take 1 tablet (10 mg total) by mouth 3 (three) times daily.   montelukast 10 MG tablet Commonly known as: SINGULAIR Take 10 mg by mouth daily.   multivitamin with minerals Tabs tablet Take 1 tablet by mouth daily.   pantoprazole 40 MG tablet Commonly known as: PROTONIX Take 40 mg by mouth daily with breakfast.   Refresh 1.4-0.6 % Soln Generic drug: Polyvinyl Alcohol-Povidone PF Apply 1 drop to eye daily as needed (dry eyes).   rifaximin 550 MG Tabs tablet Commonly known as: XIFAXAN Take 1 tablet (550 mg total) by mouth 2 (two) times daily.   spironolactone 25 MG tablet Commonly known as: ALDACTONE Take 1 tablet (25 mg  total) by mouth daily. What changed:  medication strength how much to take when to take this   tamsulosin 0.4 MG Caps capsule Commonly known as: FLOMAX Take 0.4 mg by mouth in the morning and at bedtime.   traZODone 150 MG tablet Commonly known as: DESYREL Take 75 mg by mouth at bedtime as needed for sleep.   Zinc Oxide 12.8 % ointment Commonly known as: TRIPLE PASTE Apply topically 2 (two) times daily. Apply to perianal region, folds of groin and both axilla               Discharge Care Instructions  (From admission, onward)           Start     Ordered   04/10/22 0000  Discharge wound care:       Comments: Apply foam dressing to wounds of anterior right leg and left arm.  Change daily.   04/10/22 1343            Follow-up Information     [facility], [place] Schedule an appointment as soon as possible for a visit in 1 week(s).   Contact information: [place] [id number] [phone]         [place]. Schedule an appointment as soon as possible for a visit in 2 week(s).                  Discharge Exam: Filed Weights   04/05/22 0600 04/08/22 0458 04/09/22 0439  Weight: 70.2 kg 73 kg 71.6 kg    Constitutional: Lethargic but arousable and oriented x3, no associated distress.   Respiratory: clear to auscultation bilaterally, no wheezing, no crackles. Normal respiratory effort. No accessory muscle use.  Cardiovascular: Regular rate and rhythm, no murmurs / rubs / gallops. No extremity edema. 2+ pedal pulses. No carotid bruits.  Abdomen: Abdomen is protuberant but soft and nontender.  No evidence of intra-abdominal masses.  Positive bowel sounds noted in all quadrants.   Musculoskeletal: No joint deformity upper and lower extremities. Good ROM, no contractures. Normal muscle tone.     Condition at discharge: fair  The results of significant diagnostics from this hospitalization (including  imaging, microbiology, ancillary and laboratory) are listed below for reference.   Imaging Studies: CT HEAD WO CONTRAST (5MM)  Result Date: 04/09/2022 CLINICAL DATA:  72 year old male with hepatic encephalopathy. Abnormal ammonia level. Neurologic deficit. EXAM: CT HEAD WITHOUT CONTRAST TECHNIQUE: Contiguous axial images were obtained from the base of the skull through the vertex without intravenous contrast. RADIATION DOSE REDUCTION: This exam was performed according to the departmental dose-optimization program which includes automated exposure control, adjustment of the mA and/or kV according to patient size and/or use of iterative reconstruction technique. COMPARISON:  Head CT 04/01/2022  and earlier. FINDINGS: Brain: Conspicuous basal ganglia dystrophic calcifications. No midline shift, ventriculomegaly, mass effect, evidence of mass lesion, intracranial hemorrhage or evidence of cortically based acute infarction. Patchy and confluent bilateral cerebral white matter hypodensity appears stable. No definite cortical encephalomalacia. Vascular: Calcified atherosclerosis at the skull base. No suspicious intracranial vascular hyperdensity. Incidental superior sagittal sinus arachnoid granulation (physiologic coronal image 26). Skull: No acute osseous abnormality identified. Sinuses/Orbits: Visualized paranasal sinuses and mastoids are clear. Other: No acute orbit or scalp soft tissue finding. IMPRESSION: 1. No acute intracranial abnormality. 2. Stable non contrast CT appearance of chronic small vessel disease. Electronically Signed   By: Genevie Ann M.D.   On: 04/09/2022 08:23   DG Abd 2 Views  Result Date: 04/06/2022 CLINICAL DATA:  Ileus EXAM: ABDOMEN - 2 VIEW COMPARISON:  Radiograph 04/04/2022, CT 04/01/2022 FINDINGS: There is persistent diffuse dilation of bowel, including the stomach, small bowel, and colon. Increased small bowel distension in comparison to prior. Right upper quadrant stent noted. Persistent  bilateral pleural effusions. Degenerative changes of the spine. IMPRESSION: Persistent diffuse bowel dilation, increased from prior exam, compatible with ileus. Recommend continued radiographic follow-up. Electronically Signed   By: Maurine Simmering M.D.   On: 04/06/2022 09:06   DG Abd Portable 1V  Result Date: 04/04/2022 CLINICAL DATA:  Concern for ileus.  Passing less gas. EXAM: PORTABLE ABDOMEN - 1 VIEW COMPARISON:  01/17/2022 and CT 04/01/2022 FINDINGS: Gaseous distention of the stomach. Numerous gas-filled loops of small and large bowel in the abdomen which are normal in caliber. TIPS. Left left-greater-than-right pleural effusions and atelectasis/consolidation. IMPRESSION: Gaseous distention of the stomach. Gas-filled loops of small and large bowel without evidence of obstruction. Findings suggestive of ileus. Left-greater-than-right pleural effusions and associated consolidation/atelectasis. Electronically Signed   By: Placido Sou M.D.   On: 04/04/2022 19:56   DG CHEST PORT 1 VIEW  Result Date: 04/03/2022 CLINICAL DATA:  Pleural effusions. EXAM: PORTABLE CHEST 1 VIEW COMPARISON:  None Available. FINDINGS: Bilateral pleural effusions persists, smaller in the interval. Cardiomediastinal silhouette is normal. No pneumothorax. No other acute abnormalities. IMPRESSION: Bilateral pleural effusions, smaller in the interval. No other changes. Electronically Signed   By: Dorise Bullion III M.D.   On: 04/03/2022 12:46   CT CHEST ABDOMEN PELVIS W CONTRAST  Result Date: 04/01/2022 CLINICAL DATA:  Status post TIPS creation on 03/26/2022 with increasing confusion and abdominal distention EXAM: CT CHEST, ABDOMEN, AND PELVIS WITH CONTRAST TECHNIQUE: Multidetector CT imaging of the chest, abdomen and pelvis was performed following the standard protocol during bolus administration of intravenous contrast. RADIATION DOSE REDUCTION: This exam was performed according to the departmental dose-optimization program which  includes automated exposure control, adjustment of the mA and/or kV according to patient size and/or use of iterative reconstruction technique. CONTRAST:  40m OMNIPAQUE IOHEXOL 300 MG/ML  SOLN COMPARISON:  CTA abdomen and pelvis dated 02/09/2022, CT chest dated 05/06/2021 FINDINGS: CT CHEST FINDINGS Cardiovascular: Normal heart size. No significant pericardial fluid/thickening. Coronary artery calcifications. Ascending aorta measures 4.1 cm. No central pulmonary emboli. Mediastinum/Nodes: Imaged thyroid gland without nodules meeting criteria for imaging follow-up by size. Normal esophagus. No pathologically enlarged axillary, supraclavicular, mediastinal, or hilar lymph nodes. Lungs/Pleura: The central airways are patent. Persistent near-complete collapse of bilateral lower lobes. Mild interlobular septal thickening, left-greater-than-right. No pneumothorax. Similar moderate left pleural effusion and decreased small right pleural effusion. Musculoskeletal: No acute or abnormal lytic or blastic osseous lesions. CT ABDOMEN PELVIS FINDINGS Hepatobiliary: No focal hepatic lesions. No intra or extrahepatic biliary  ductal dilation. Cholecystectomy. Pancreas: No focal lesions or main ductal dilation. Spleen: Unchanged splenomegaly measures 17.0 cm. Adrenals/Urinary Tract: No adrenal nodules. Normal appearance of the right kidney without suspicious renal mass, calculi, or hydronephrosis. Chronic left hydroureteronephrosis with atrophic parenchyma. Similar dependent 2 mm stone in the distal left ureter. No focal bladder wall thickening. Stomach/Bowel: Normal appearance of the stomach. Duodenal diverticulum in the fourth portion. No evidence of bowel wall thickening, distention, or inflammatory changes. Normal appendix. Vascular/Lymphatic: Interval TIPS creation, which appears patent. Aortic atherosclerosis. No enlarged abdominal or pelvic lymph nodes. Reproductive: Prostate is unremarkable. Other: No free fluid, fluid  collection, or free air. Musculoskeletal: No acute or abnormal lytic or blastic osseous findings. Unchanged T12 compression deformity. IMPRESSION: CHEST: 1. Similar moderate left pleural effusion and decreased small right pleural effusion with persistent near-complete collapse of bilateral lower lobes. 2. Mild interlobular septal thickening, suggestive of mild pulmonary edema. 3. Ascending aortic aneurysm measures 4.1 cm. Recommend annual imaging followup by CTA or MRA. This recommendation follows 2010 ACCF/AHA/AATS/ACR/ASA/SCA/SCAI/SIR/STS/SVM Guidelines for the Diagnosis and Management of Patients with Thoracic Aortic Disease. Circulation. 2010; 121: F790-W409. Aortic aneurysm NOS (ICD10-I71.9) ABDOMEN/PELVIS: 1. No acute abnormality in the abdomen or pelvis. 2. Interval TIPS creation, which appears patent. 3. Chronic left hydroureteronephrosis with atrophic parenchyma. Similar dependent 2 mm stone in the distal left ureter. 4. Unchanged splenomegaly. 5. Aortic Atherosclerosis (ICD10-I70.0). Electronically Signed   By: Darrin Nipper M.D.   On: 04/01/2022 15:28   CT Head Wo Contrast  Result Date: 04/01/2022 CLINICAL DATA:  Delirium.  Confusion. EXAM: CT HEAD WITHOUT CONTRAST TECHNIQUE: Contiguous axial images were obtained from the base of the skull through the vertex without intravenous contrast. RADIATION DOSE REDUCTION: This exam was performed according to the departmental dose-optimization program which includes automated exposure control, adjustment of the mA and/or kV according to patient size and/or use of iterative reconstruction technique. COMPARISON:  CT Head 08/16/21 FINDINGS: Brain: No evidence of acute infarction, hemorrhage, hydrocephalus, extra-axial collection or mass lesion/mass effect. Mineralization of the basal ganglia bilaterally. Sequela of moderate chronic microvascular ischemic change. Vascular: No hyperdense vessel or unexpected calcification. Unchanged arachnoid granulation in the superior  sagittal sinus. Skull: Normal. Negative for fracture or focal lesion. Sinuses/Orbits: Bilateral lens replacement. No mastoid or middle ear effusion. Paranasal sinuses appear clear. Other: None. IMPRESSION: 1. No acute intracranial process. 2. Sequela of moderate chronic microvascular ischemic change. Electronically Signed   By: Marin Roberts M.D.   On: 04/01/2022 15:01   IR Tips  Result Date: 03/26/2022 CLINICAL DATA:  [name] cirrhosis, episodes of bleeding esophageal varices. Most recent bleed October 2023. EXAM: Ultrasound guidance for vascular access Intravascular ultrasound with the ICE catheter (puncture guidance) Transjugular intrahepatic portosystemic shunt creation (TIPS) MEDICATIONS: As antibiotic prophylaxis, 2 g Rocephin was ordered pre-procedure and administered intravenously within one hour of incision. ANESTHESIA/SEDATION: General - as administered by the Anesthesia department CONTRAST:  75 cc omni 300 FLUOROSCOPY: Radiation Exposure Index (as provided by the fluoroscopic device): 735 mGy Kerma COMPLICATIONS: None immediate. PROCEDURE: Informed written consent was obtained from the patient after a thorough discussion of the procedural risks, benefits and alternatives. All questions were addressed. Maximal Sterile Barrier Technique was utilized including caps, mask, sterile gowns, sterile gloves, sterile drape, hand hygiene and skin antiseptic. A timeout was performed prior to the initiation of the procedure. Under sterile conditions and general anesthesia, ultrasound micropuncture access performed the right common femoral vein. Images obtained for documentation of the patent right common femoral vein. Eight [place]  sheath inserted over a Bentson guidewire. Through this access, the 8 [place] ICE catheter was advanced under fluoroscopic guidance to the level of the intrahepatic IVC junction for intravascular guidance of the TIPS creation. Also under sterile conditions and general anesthesia,  ultrasound micropuncture access performed of the right internal jugular vein. Images obtained for documentation of the patent right internal jugular vein. Guidewire advanced followed by the Accustick dilator set. Guidewire advanced into the IVC. Tract dilatation performed to insert the 10 French TIPS sheath. Through the sheath, the 5 [place] MPA catheter initially selected the posterior right hepatic vein. Right hepatic venogram performed. Right hepatic vein is widely patent. Catheter was retracted and directed anteriorly. Eventually, the more anterior middle hepatic vein was catheterized. Middle hepatic vein position confirmed by venography and intravascular ultrasound. Using ICE ultrasound visualization, the middle hepatic vein catheterization and portal vein anatomy was defined. A puncture window was visualized. Under direct intravascular ultrasound visualization the scorpion X needle was advanced into the central right portal vein. Gentle injection confirms portal vein position. Needle was visualized within the portal vein by intravascular ultrasound. Bentson guidewire advanced easily followed by 5 [place] catheter. Contrast injection again confirms portal vein position. Initial portogram performed. Portal vein is widely patent. Initial portal pressure was 32 mm Hg. Retrograde filling of esophageal varices visualized. 6 mm mustang balloon advanced through the percutaneous liver tract. Balloon dilatation performed of the liver tract without difficulty. Ten French sheath was advanced over the balloon while deflating. This allowed advancement of the 10 French sheath into the portal vein. Five [place] marker pigtail advanced. Simultaneous hepatic and portal venography performed for accurate measurement of the necessary length. For TIPS creation, the 8-10 mm x 8 cm x 2 cm Viatorr stent was successfully deployed. Initial dilatation of the newly created TIPS was to 8 mm with an 8 x 4 mustang balloon. Following tips  creation, portosystemic gradient was 5 mmHg (portal pressure reduced to 27 mm Hg and right atrial pressure 22 mm Hg). Post TIPS portogram confirms antegrade portal flow and widely patent TIPS. Varices are decompressed and no longer visualized. All catheters and sheath removed. Hemostasis obtained with manual compression of the right IJ and right common femoral venous access sites. Patient was transferred to the PACU in stable condition for inpatient recovery overnight. IMPRESSION: 1. Successful intrahepatic portosystemic shunt creation (TIPS), as above. Patient will be admitted to the hospitalist service for overnight observation. Outpatient follow-up with a TIPS ultrasound in 3 month. Electronically Signed   By: Jerilynn Mages.  Shick M.D.   On: 03/26/2022 15:14   IR US Guide Vasc Access Right  Result Date: 03/26/2022 CLINICAL DATA:  [name] cirrhosis, episodes of bleeding esophageal varices. Most recent bleed October 2023. EXAM: Ultrasound guidance for vascular access Intravascular ultrasound with the ICE catheter (puncture guidance) Transjugular intrahepatic portosystemic shunt creation (TIPS) MEDICATIONS: As antibiotic prophylaxis, 2 g Rocephin was ordered pre-procedure and administered intravenously within one hour of incision. ANESTHESIA/SEDATION: General - as administered by the Anesthesia department CONTRAST:  75 cc omni 300 FLUOROSCOPY: Radiation Exposure Index (as provided by the fluoroscopic device): 401 mGy Kerma COMPLICATIONS: None immediate. PROCEDURE: Informed written consent was obtained from the patient after a thorough discussion of the procedural risks, benefits and alternatives. All questions were addressed. Maximal Sterile Barrier Technique was utilized including caps, mask, sterile gowns, sterile gloves, sterile drape, hand hygiene and skin antiseptic. A timeout was performed prior to the initiation of the procedure. Under sterile conditions and general anesthesia, ultrasound micropuncture access  performed the right common femoral vein. Images obtained for documentation of the patent right common femoral vein. Eight French sheath inserted over a Bentson guidewire. Through this access, the 8 [place] ICE catheter was advanced under fluoroscopic guidance to the level of the intrahepatic IVC junction for intravascular guidance of the TIPS creation. Also under sterile conditions and general anesthesia, ultrasound micropuncture access performed of the right internal jugular vein. Images obtained for documentation of the patent right internal jugular vein. Guidewire advanced followed by the Accustick dilator set. Guidewire advanced into the IVC. Tract dilatation performed to insert the 10 French TIPS sheath. Through the sheath, the 5 [place] MPA catheter initially selected the posterior right hepatic vein. Right hepatic venogram performed. Right hepatic vein is widely patent. Catheter was retracted and directed anteriorly. Eventually, the more anterior middle hepatic vein was catheterized. Middle hepatic vein position confirmed by venography and intravascular ultrasound. Using ICE ultrasound visualization, the middle hepatic vein catheterization and portal vein anatomy was defined. A puncture window was visualized. Under direct intravascular ultrasound visualization the scorpion X needle was advanced into the central right portal vein. Gentle injection confirms portal vein position. Needle was visualized within the portal vein by intravascular ultrasound. Bentson guidewire advanced easily followed by 5 [place] catheter. Contrast injection again confirms portal vein position. Initial portogram performed. Portal vein is widely patent. Initial portal pressure was 32 mm Hg. Retrograde filling of esophageal varices visualized. 6 mm mustang balloon advanced through the percutaneous liver tract. Balloon dilatation performed of the liver tract without difficulty. Ten French sheath was advanced over the balloon while  deflating. This allowed advancement of the 10 French sheath into the portal vein. Five [place] marker pigtail advanced. Simultaneous hepatic and portal venography performed for accurate measurement of the necessary length. For TIPS creation, the 8-10 mm x 8 cm x 2 cm Viatorr stent was successfully deployed. Initial dilatation of the newly created TIPS was to 8 mm with an 8 x 4 mustang balloon. Following tips creation, portosystemic gradient was 5 mmHg (portal pressure reduced to 27 mm Hg and right atrial pressure 22 mm Hg). Post TIPS portogram confirms antegrade portal flow and widely patent TIPS. Varices are decompressed and no longer visualized. All catheters and sheath removed. Hemostasis obtained with manual compression of the right IJ and right common femoral venous access sites. Patient was transferred to the PACU in stable condition for inpatient recovery overnight. IMPRESSION: 1. Successful intrahepatic portosystemic shunt creation (TIPS), as above. Patient will be admitted to the hospitalist service for overnight observation. Outpatient follow-up with a TIPS ultrasound in 3 month. Electronically Signed   By: Jerilynn Mages.  Shick M.D.   On: 03/26/2022 15:14   IR INTRAVASCULAR ULTRASOUND NON CORONARY  Result Date: 03/26/2022 CLINICAL DATA:  [name] cirrhosis, episodes of bleeding esophageal varices. Most recent bleed October 2023. EXAM: Ultrasound guidance for vascular access Intravascular ultrasound with the ICE catheter (puncture guidance) Transjugular intrahepatic portosystemic shunt creation (TIPS) MEDICATIONS: As antibiotic prophylaxis, 2 g Rocephin was ordered pre-procedure and administered intravenously within one hour of incision. ANESTHESIA/SEDATION: General - as administered by the Anesthesia department CONTRAST:  75 cc omni 300 FLUOROSCOPY: Radiation Exposure Index (as provided by the fluoroscopic device): 381 mGy Kerma COMPLICATIONS: None immediate. PROCEDURE: Informed written consent was obtained from the  patient after a thorough discussion of the procedural risks, benefits and alternatives. All questions were addressed. Maximal Sterile Barrier Technique was utilized including caps, mask, sterile gowns, sterile gloves, sterile drape, hand hygiene and skin antiseptic. A timeout  was performed prior to the initiation of the procedure. Under sterile conditions and general anesthesia, ultrasound micropuncture access performed the right common femoral vein. Images obtained for documentation of the patent right common femoral vein. Eight French sheath inserted over a Bentson guidewire. Through this access, the 8 [place] ICE catheter was advanced under fluoroscopic guidance to the level of the intrahepatic IVC junction for intravascular guidance of the TIPS creation. Also under sterile conditions and general anesthesia, ultrasound micropuncture access performed of the right internal jugular vein. Images obtained for documentation of the patent right internal jugular vein. Guidewire advanced followed by the Accustick dilator set. Guidewire advanced into the IVC. Tract dilatation performed to insert the 10 French TIPS sheath. Through the sheath, the 5 [place] MPA catheter initially selected the posterior right hepatic vein. Right hepatic venogram performed. Right hepatic vein is widely patent. Catheter was retracted and directed anteriorly. Eventually, the more anterior middle hepatic vein was catheterized. Middle hepatic vein position confirmed by venography and intravascular ultrasound. Using ICE ultrasound visualization, the middle hepatic vein catheterization and portal vein anatomy was defined. A puncture window was visualized. Under direct intravascular ultrasound visualization the scorpion X needle was advanced into the central right portal vein. Gentle injection confirms portal vein position. Needle was visualized within the portal vein by intravascular ultrasound. Bentson guidewire advanced easily followed by 5  [place] catheter. Contrast injection again confirms portal vein position. Initial portogram performed. Portal vein is widely patent. Initial portal pressure was 32 mm Hg. Retrograde filling of esophageal varices visualized. 6 mm mustang balloon advanced through the percutaneous liver tract. Balloon dilatation performed of the liver tract without difficulty. Ten French sheath was advanced over the balloon while deflating. This allowed advancement of the 10 French sheath into the portal vein. Five [place] marker pigtail advanced. Simultaneous hepatic and portal venography performed for accurate measurement of the necessary length. For TIPS creation, the 8-10 mm x 8 cm x 2 cm Viatorr stent was successfully deployed. Initial dilatation of the newly created TIPS was to 8 mm with an 8 x 4 mustang balloon. Following tips creation, portosystemic gradient was 5 mmHg (portal pressure reduced to 27 mm Hg and right atrial pressure 22 mm Hg). Post TIPS portogram confirms antegrade portal flow and widely patent TIPS. Varices are decompressed and no longer visualized. All catheters and sheath removed. Hemostasis obtained with manual compression of the right IJ and right common femoral venous access sites. Patient was transferred to the PACU in stable condition for inpatient recovery overnight. IMPRESSION: 1. Successful intrahepatic portosystemic shunt creation (TIPS), as above. Patient will be admitted to the hospitalist service for overnight observation. Outpatient follow-up with a TIPS ultrasound in 3 month. Electronically Signed   By: Jerilynn Mages.  Shick M.D.   On: 03/26/2022 15:14    Microbiology: Results for orders placed or performed during the hospital encounter of 01/17/22  MRSA Next Gen by PCR, Nasal     Status: None   Collection Time: 01/18/22  8:06 PM   Specimen: Nasal Mucosa; Nasal Swab  Result Value Ref Range Status   MRSA by PCR Next Gen NOT DETECTED NOT DETECTED Final    Comment: (NOTE) The GeneXpert MRSA Assay (FDA  approved for NASAL specimens only), is one component of a comprehensive MRSA colonization surveillance program. It is not intended to diagnose MRSA infection nor to guide or monitor treatment for MRSA infections. Test performance is not FDA approved in patients less than 55 years old. Performed at [facility],  [address] [place], [place] [address]     Labs: CBC: Recent Labs  Lab 04/05/22 1050 04/06/22 0358 04/07/22 0343 04/08/22 0404 04/09/22 0334 04/10/22 0515  WBC 7.0 8.0 5.6 3.8* 3.3* 2.7*  NEUTROABS 5.5 6.1  --   --   --  1.8  HGB 11.6* 12.2* 10.3* 10.0* 9.3* 10.7*  HCT 35.4* 37.3* 31.6* 31.3* 27.5* 32.6*  MCV 94.1 94.9 96.3 98.7 94.5 95.6  PLT 103* 108* 94* 76* 83* 87*   Basic Metabolic Panel: Recent Labs  Lab 04/06/22 0358 04/07/22 0343 04/08/22 0404 04/09/22 0334 04/10/22 0515  NA 131* 133* 133* 130* 132*  K 3.3* 4.2 3.8 3.7 3.7  CL 97* 104 105 102 103  CO2 '24 22 22 '$ 21* 23  GLUCOSE 205* 256* 148* 162* 145*  BUN 24* 24* '19 14 12  '$ CREATININE 1.33* 1.25* 1.00 0.95 0.92  CALCIUM 10.4* 9.8 9.4 9.5 9.8  MG 2.0  --  1.8 1.7 1.9   Liver Function Tests: Recent Labs  Lab 04/06/22 0358 04/07/22 0343 04/08/22 0404 04/09/22 0334 04/10/22 0515  AST 56* 36 41 60* 63*  ALT '20 20 15 29 21  '$ ALKPHOS 122 96 91 95 101  BILITOT 2.2* 1.3* 1.2 1.2 1.3*  PROT 8.5* 6.5 6.1* 6.1* 6.8  ALBUMIN 3.2* 2.2* 2.3* 2.2* 2.5*   CBG: Recent Labs  Lab 04/09/22 1153 04/09/22 1643 04/09/22 2129 04/10/22 0739 04/10/22 1202  GLUCAP 184* 197* 139* 151* 169*    Discharge time spent: greater than 30 minutes.  Signed: Vernelle Emerald, MD [facility] 04/10/2022

## 2022-04-10 NOTE — Discharge Instructions (Addendum)
Please take all prescribed medications exactly as instructed this includes your lactulose and rifaximin Please continue to take the lactulose as instructed to achieve at least 3 loose stools daily Consume a low-sodium low carbohydrate diet Only get out of bed with assistance or with using an assistive device. Both physical therapy and nursing home health services have been ordered to assist you in your home.  They should be following up with you in the next 24 to 48 hours to establish care. Please follow-up with both your primary care provider in 1 week as well as [facility] in 2 to 4 weeks. Return the emergency department if you develop worsening abdominal pain confusion fever or inability to tolerate oral intake.

## 2022-04-10 NOTE — Progress Notes (Signed)
Subjective: No acute events.  Objective: Vital signs in last 24 hours: Temp:  [97.5 F (36.4 C)-98.2 F (36.8 C)] 98.2 F (36.8 C) (01/13 0447) Pulse Rate:  [50-75] 67 (01/13 0447) Resp:  [17-18] 18 (01/13 0447) BP: (112-115)/(68-93) 115/73 (01/13 0447) SpO2:  [94 %-99 %] 97 % (01/13 0447) Last BM Date : 04/10/22  Intake/Output from previous day: 01/12 0701 - 01/13 0700 In: 600 [P.O.:600] Out: 2200 [Urine:2200] Intake/Output this shift: No intake/output data recorded.  General appearance: slowed mentation GI: soft, non-tender; bowel sounds normal; no masses,  no organomegaly Extremities: + asterixis  Lab Results: Recent Labs    04/08/22 0404 04/09/22 0334 04/10/22 0515  WBC 3.8* 3.3* 2.7*  HGB 10.0* 9.3* 10.7*  HCT 31.3* 27.5* 32.6*  PLT 76* 83* 87*   BMET Recent Labs    04/08/22 0404 04/09/22 0334 04/10/22 0515  NA 133* 130* 132*  K 3.8 3.7 3.7  CL 105 102 103  CO2 22 21* 23  GLUCOSE 148* 162* 145*  BUN '19 14 12  '$ CREATININE 1.00 0.95 0.92  CALCIUM 9.4 9.5 9.8   LFT Recent Labs    04/10/22 0515  PROT 6.8  ALBUMIN 2.5*  AST 63*  ALT 21  ALKPHOS 101  BILITOT 1.3*   PT/INR No results for input(s): "LABPROT", "INR" in the last 72 hours. Hepatitis Panel No results for input(s): "HEPBSAG", "HCVAB", "HEPAIGM", "HEPBIGM" in the last 72 hours. C-Diff No results for input(s): "CDIFFTOX" in the last 72 hours. Fecal Lactopherrin No results for input(s): "FECLLACTOFRN" in the last 72 hours.  Studies/Results: CT HEAD WO CONTRAST (5MM)  Result Date: 04/09/2022 CLINICAL DATA:  72 year old male with hepatic encephalopathy. Abnormal ammonia level. Neurologic deficit. EXAM: CT HEAD WITHOUT CONTRAST TECHNIQUE: Contiguous axial images were obtained from the base of the skull through the vertex without intravenous contrast. RADIATION DOSE REDUCTION: This exam was performed according to the departmental dose-optimization program which includes automated exposure  control, adjustment of the mA and/or kV according to patient size and/or use of iterative reconstruction technique. COMPARISON:  Head CT 04/01/2022 and earlier. FINDINGS: Brain: Conspicuous basal ganglia dystrophic calcifications. No midline shift, ventriculomegaly, mass effect, evidence of mass lesion, intracranial hemorrhage or evidence of cortically based acute infarction. Patchy and confluent bilateral cerebral white matter hypodensity appears stable. No definite cortical encephalomalacia. Vascular: Calcified atherosclerosis at the skull base. No suspicious intracranial vascular hyperdensity. Incidental superior sagittal sinus arachnoid granulation (physiologic coronal image 26). Skull: No acute osseous abnormality identified. Sinuses/Orbits: Visualized paranasal sinuses and mastoids are clear. Other: No acute orbit or scalp soft tissue finding. IMPRESSION: 1. No acute intracranial abnormality. 2. Stable non contrast CT appearance of chronic small vessel disease. Electronically Signed   By: Genevie Ann M.D.   On: 04/09/2022 08:23    Medications: Scheduled:  budesonide  0.25 mg Nebulization BID   carbidopa-levodopa  1 tablet Oral TID   DULoxetine  40 mg Oral Daily   feeding supplement (GLUCERNA SHAKE)  237 mL Oral TID BM   ferrous sulfate  325 mg Oral Once per day on Mon Wed Fri   furosemide  20 mg Oral Daily   insulin aspart  0-9 Units Subcutaneous TID WC   insulin glargine-yfgn  10 Units Subcutaneous Daily   lactulose  30 g Oral QID   latanoprost  1 drop Both Eyes QHS   midodrine  10 mg Oral TID with meals   montelukast  10 mg Oral Daily   multivitamin with minerals  1 tablet Oral  Daily   pantoprazole  40 mg Oral Q breakfast   rifaximin  550 mg Oral BID   spironolactone  25 mg Oral Daily   tamsulosin  0.4 mg Oral BID   Zinc Oxide   Topical BID   zinc sulfate  220 mg Oral BID   Continuous:  Assessment/Plan: 1) Hepatic encephalopathy. 2) Multiple medical problems.   His wife states that  he is going to be discharged this afternoon.  It appears that he is stable with his hepatic encephalopathy.  Plan: 1) Okay with discharge. 2) Follow up as previously scheduled.  LOS: 8 days   Joseth Weigel D 04/10/2022, 12:30 PM

## 2022-04-10 NOTE — TOC Transition Note (Signed)
Transition of Care [facility]) - CM/SW Discharge Note   Patient Details  Name: Corey Wood MRN: [id number] Date of Birth: 08/31/1950  [facility] of Care [facility]) CM/SW Contact:  Henrietta Dine, RN Phone Number: 04/10/2022, 3:11 PM   Clinical Narrative:    Notified pt will d/c home w/ spouse; pt previously active w/ [facility] for HHPT/OT/RN; per Dr Cyd Silence pt's wife declined medical transpor and she will drive him home; Corene Cornea at [facility] notified of pt d/c; no TOC needs.    Final next level of care: Home w Home Health Services Barriers to Discharge: No Barriers Identified   Patient Goals and CMS Choice CMS Medicare.gov Compare Post Acute Care list provided to:: Patient Represenative (must comment) Choice offered to / list presented to : Spouse  Discharge Placement                         Discharge Plan and Services Additional resources added to the After Visit Summary for   In-house Referral: Clinical Social Work   Post Acute Care Choice: [place]                               Social Determinants of Health (SDOH) Interventions SDOH Screenings   Food Insecurity: No Food Insecurity (04/02/2022)  Housing: Low Risk  (04/02/2022)  Transportation Needs: No Transportation Needs (04/02/2022)  Utilities: Not At Risk (04/02/2022)  Tobacco Use: Low Risk  (04/09/2022)     Readmission Risk Interventions    05/02/2020    2:41 PM  Readmission Risk Prevention Plan  Transportation Screening Complete  PCP or Specialist Appt within 3-5 Days Complete  HRI or [place] Complete  Social Work Consult for [place] Planning/Counseling Complete  Palliative Care Screening Not Applicable  Medication Review [employer]) Referral to Pharmacy

## 2022-04-12 ENCOUNTER — Telehealth: Payer: Self-pay

## 2022-04-12 NOTE — Telephone Encounter (Signed)
Called and spoke with patient's wife Corey Wood. Pt has been scheduled for a follow up appt with Dr. Havery Moros on Tuesday, 05/05/22 at 30 am. Corey Wood states that she spoke with [facility] and they have not been able to come in and assist the patient because they do not have the orders. I told Corey Wood that I can see if I can get in touch with the right person to help. I told Corey Wood that I will let her know what I find out. Corey Wood verbalized understanding and had no concerns at the end of the call.  [name], RN it looks like you assisted the patient with his transition of care. Are you able to contact [facility] to make sure the orders were received so that they can begin providing care? Or if you are able to let know an update so that I can provide the information to the patient's wife? Thank you for your assistance.

## 2022-04-12 NOTE — Telephone Encounter (Signed)
-----  Message from Yetta Flock, MD sent at 04/12/2022  7:29 AM EST ----- Regarding: outpatient follow up [facility] can you help coordinate follow up with me or APP in the next 2-3 weeks? Thanks If nothing open let me know I will try to work him in

## 2022-04-15 ENCOUNTER — Other Ambulatory Visit (HOSPITAL_COMMUNITY): Payer: Self-pay

## 2022-04-18 IMAGING — DX DG CHEST 1V PORT
1 series · 1 of 1 positions shown · non-contrast
Comparison: 11/06/2019

CLINICAL DATA: Hemoptysis

EXAM:
PORTABLE CHEST 1 VIEW

[chest ap]
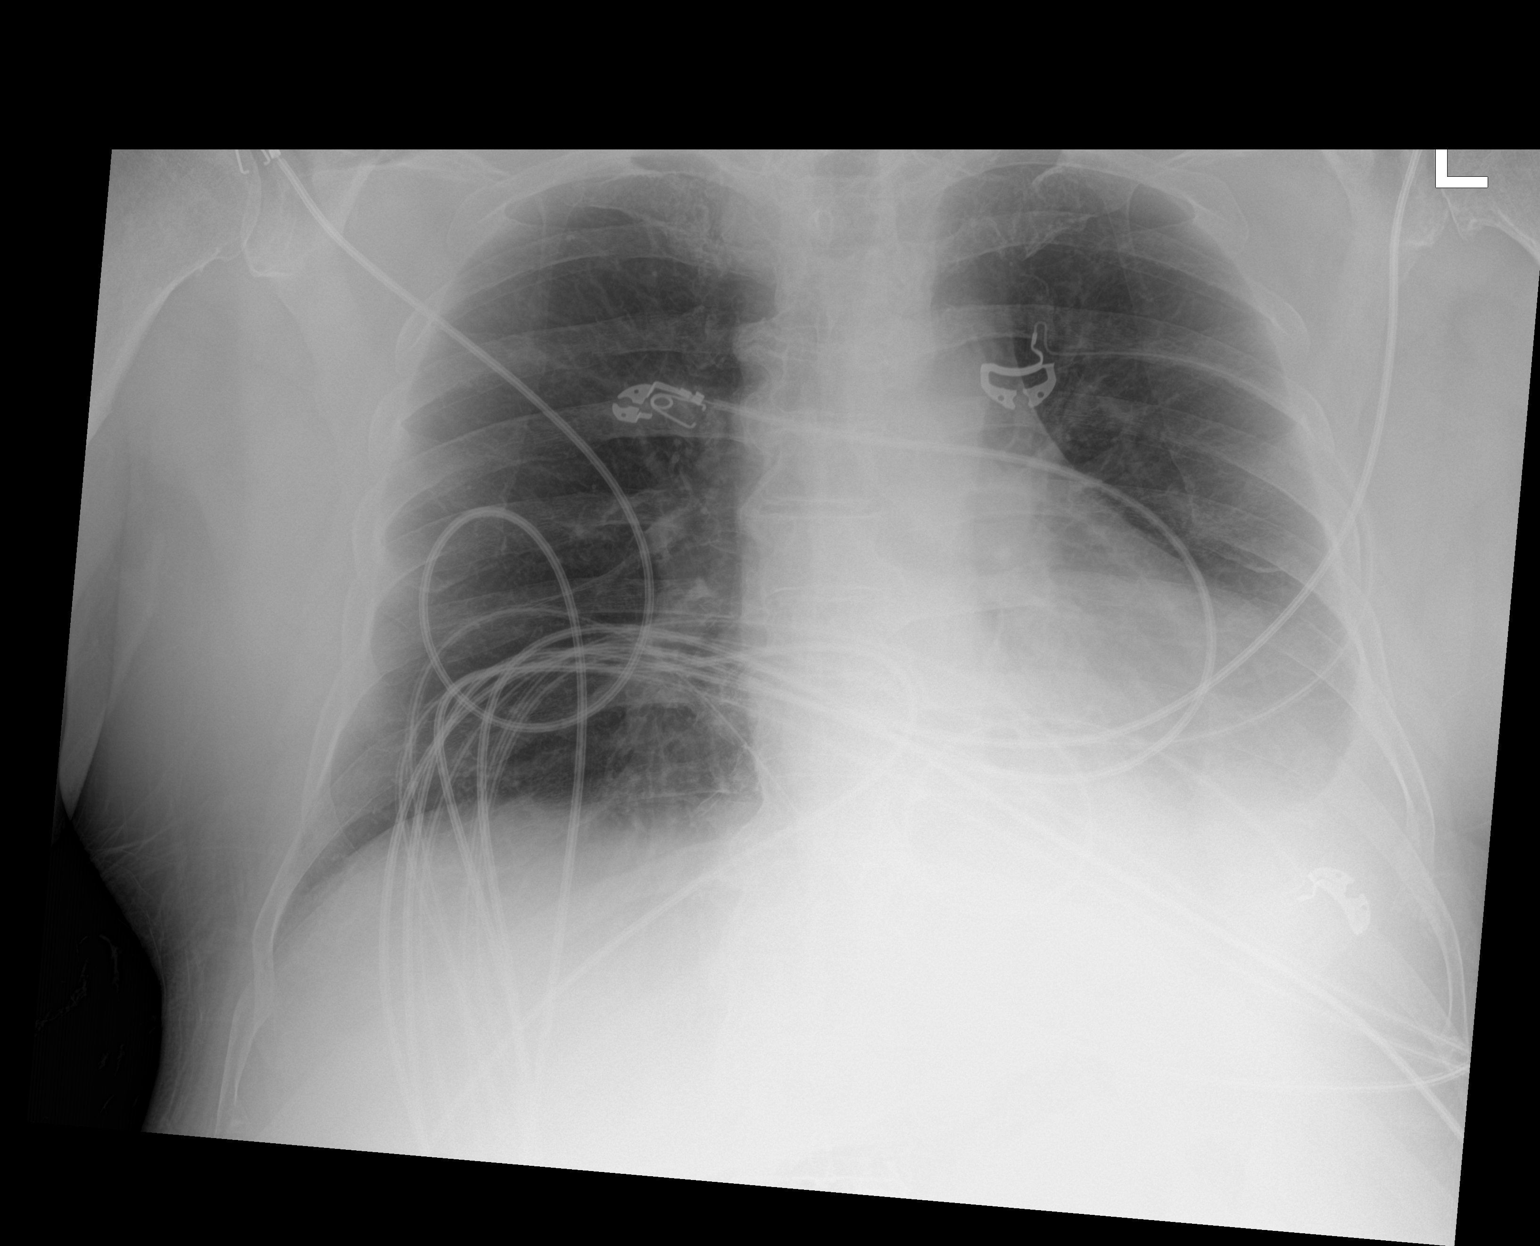

[1 of 1 positions shown; findings below may reference images not displayed]

FINDINGS: Small left pleural effusion. Generous heart size accentuated by
technique. No air bronchogram or pulmonary edema. No pneumothorax.
Extensive artifact from EKG leads.
IMPRESSION: Small left pleural effusion.

## 2022-04-21 IMAGING — DX DG CHEST 1V PORT
1 series · 1 of 1 positions shown · non-contrast
Comparison: 04/29/2020 chest radiograph.

CLINICAL DATA: Central line placement

EXAM:
PORTABLE CHEST 1 VIEW

[chest]
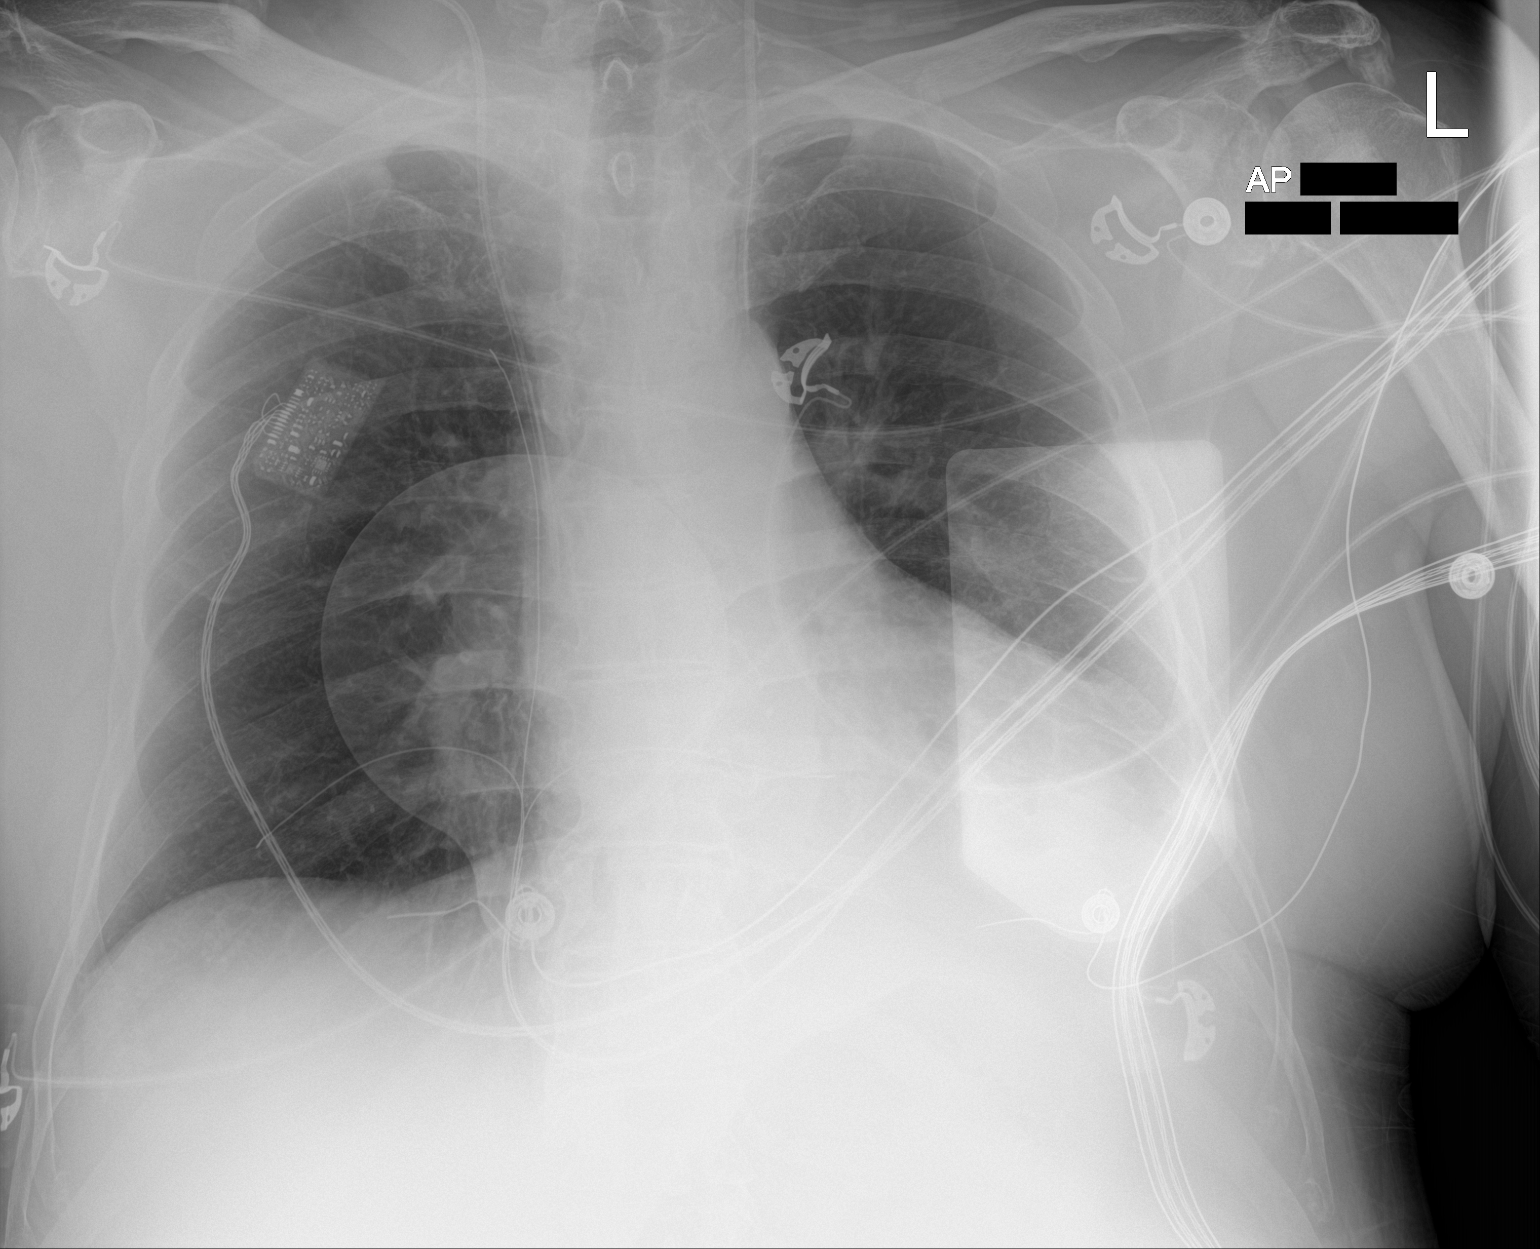

[1 of 1 positions shown; findings below may reference images not displayed]

FINDINGS: Right internal jugular central venous catheter terminates in lower
third of the SVC. Left internal jugular central venous catheter
terminates in high left mediastinum at the superior margin of the
aortic arch in an indeterminate position. Pacer pads overlie chest
bilaterally. Stable cardiomediastinal silhouette with top-normal
heart size. No pneumothorax. Stable small left pleural effusion. No
right pleural effusion. No pulmonary edema. Stable mild left basilar
hazy opacity, favor atelectasis.
IMPRESSION: 1. Right internal jugular central venous catheter terminates in the
lower third of the SVC.
2. Left internal jugular central venous catheter terminates in the
high left mediastinum at the superior margin of the aortic arch in
an indeterminate position, suspicious for arterial placement as
previously mentioned.
3. Stable small left pleural effusion and mild left basilar hazy
opacity, favor atelectasis.

Critical Value/emergent results were called by telephone at the time
who verbally acknowledged these results. The left neck catheter has
been removed by the time of this interpretation.

## 2022-04-21 NOTE — Telephone Encounter (Signed)
I sent a secure chat message to Memory Argue, RN to follow up on this and received no response. I called and spoke with patient's wife. She stated that [facility] has been coming out and has orders that they need at this time. We will see them on 05/05/22 for follow up. Pt's wife had no concerns at the end of the call.

## 2022-04-22 ENCOUNTER — Telehealth: Payer: Self-pay | Admitting: Gastroenterology

## 2022-04-22 NOTE — Telephone Encounter (Signed)
Called and spoke with Ivin Booty regarding Dr. Blanch Media recommendations as outlined below. Pt still has not had a BM as of yet. Ivin Booty states that pt will be due for next dose around 6 pm. I told her to continue giving Lactulose to patient until her produces a bowel movement. Ivin Booty is aware that this can take some time. I advised that if patient still has not had a BM by tomorrow despite several doses of Lactulose he should go to ER. Ivin Booty verbalized understanding and had no concerns at the end of the call.

## 2022-04-22 NOTE — Telephone Encounter (Signed)
Patient's wife called, said patient has been acting "out of it". He is disoriented and also she states she has given him three doses of Lactulose and he has yet to have a bowel movement. Patient is requesting [facility] to return her call. Please advise.

## 2022-04-22 NOTE — Telephone Encounter (Signed)
1.  Keep giving doses of lactulose until he moves his bowels.  Cannot overdose.  Should have 3-5 bowel movements today 2.  Refilled lactulose multiple times so they have plenty 3.  If he starts moving his bowels multiple times with lactulose therapy, things should improve.  However, it may take some hours, or even into tomorrow. 4.  However, if, despite multiple bowel movements with lactulose, he is significantly confused, then unfortunately, he will have to present to the hospital for assessment and treatment. Thanks, Dr.  Henrene Pastor

## 2022-04-22 NOTE — Telephone Encounter (Signed)
DOD PM of 04/22/22 -  Dr. Henrene Pastor, please advise. Thanks   Dr. Doyne Keel patient with history of Hepatic Encephalopathy, s/p TIPS on 03/26/22  Called and spoke with patient's wife Corey Wood. She states that patient has not passed any stool at all today. She has given him a 4th dose of Lactulose already. Pt is still taking Rifaximin BID. Corey Wood reports that patient is more confused and weaker than what he was yesterday. He ate a good breakfast but has not eaten anything since. Corey Wood denies that patient is jaundice. Corey Wood is concerned with patient's history, she does not want him to have to be hospitalized. I told Corey Wood that I will call her back with DOD's recommendations.

## 2022-04-23 ENCOUNTER — Telehealth: Payer: Self-pay | Admitting: Gastroenterology

## 2022-04-23 ENCOUNTER — Other Ambulatory Visit: Payer: Self-pay

## 2022-04-23 IMAGING — US US HEPATIC LIVER DOPPLER
2 series · 13 of 25 positions shown · non-contrast
Comparison: CT abdomen pelvis 11/07/2019

CLINICAL DATA: Cirrhosis

EXAM:
DUPLEX ULTRASOUND OF LIVER
TECHNIQUE: Color and duplex Doppler ultrasound was performed to evaluate the
hepatic in-flow and out-flow vessels.

[Series 1: us liver doppler · 12 of 35 slices shown]
[im 1/35]
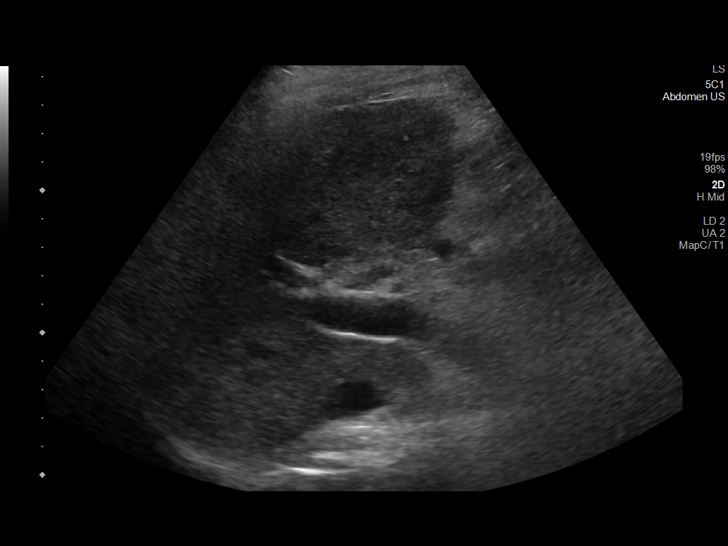
[im 3/35]
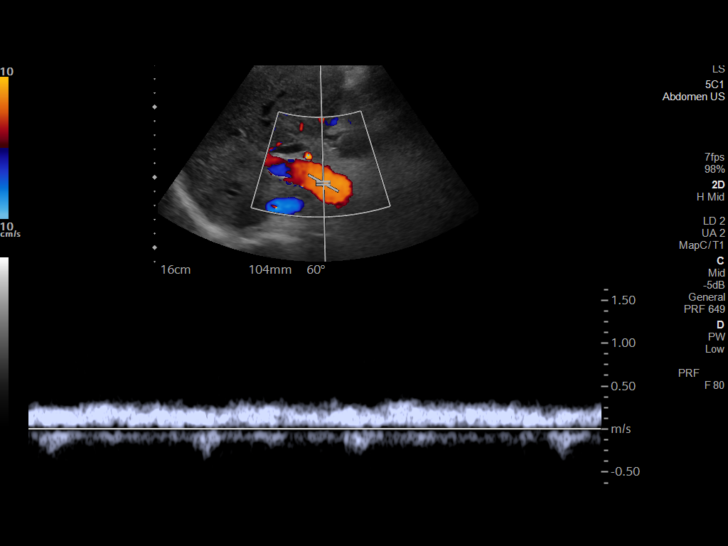
[im 6/35]
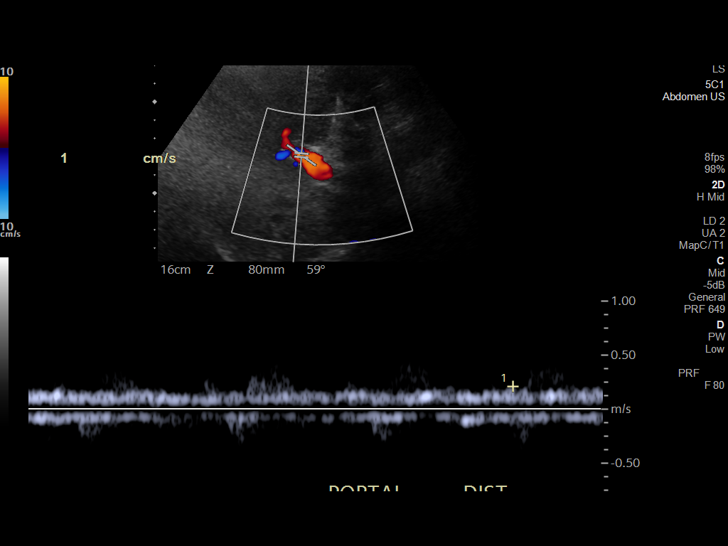
[im 9/35]
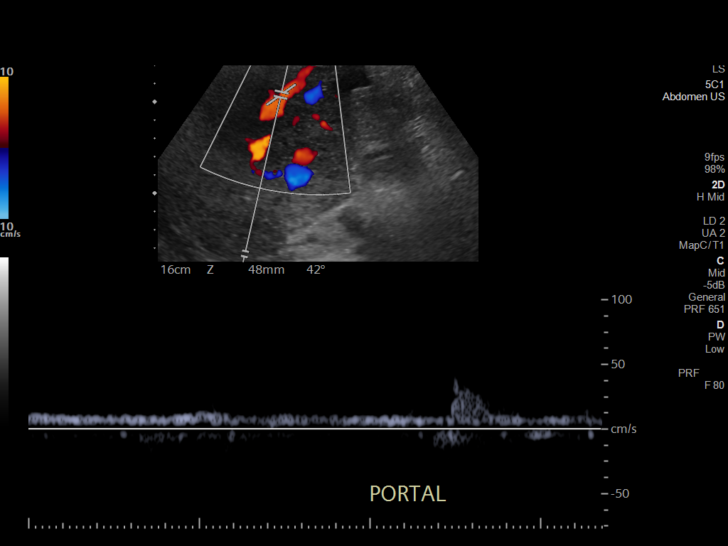
[im 12/35]
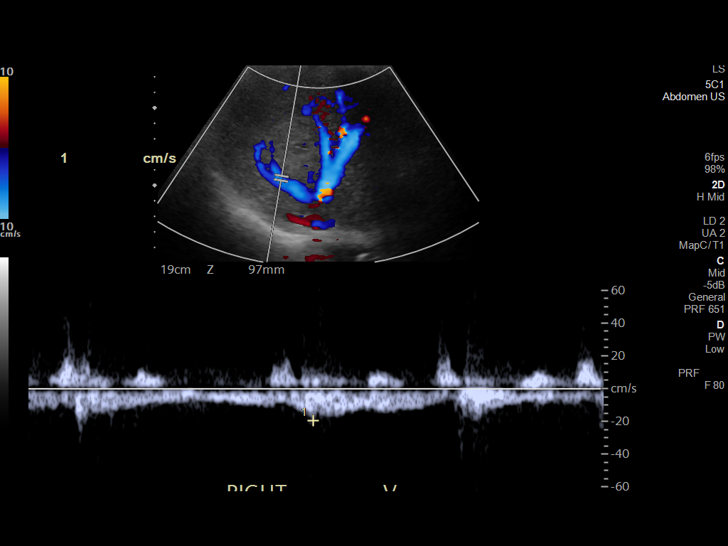
[im 15/35]
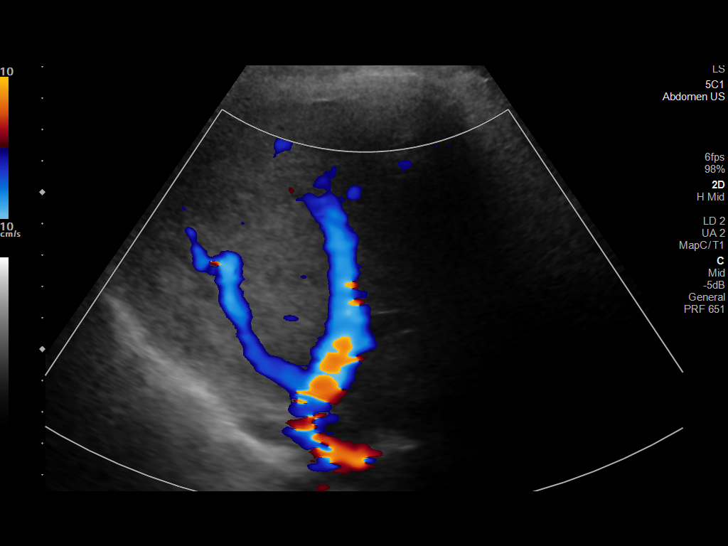
[im 18/35]
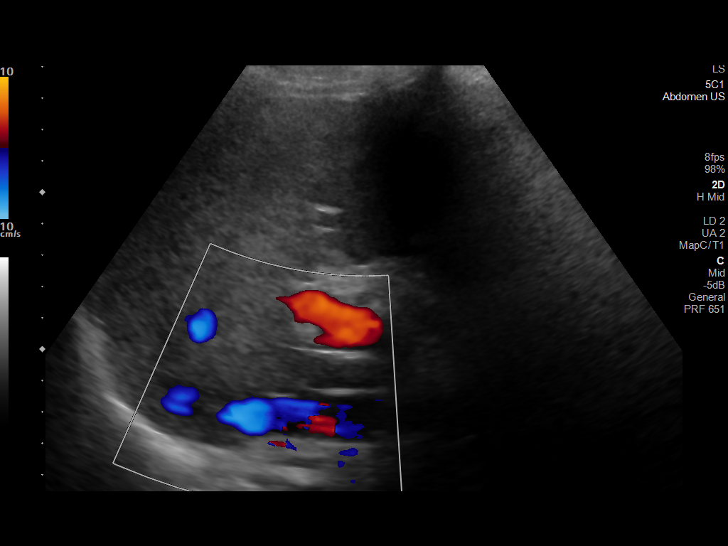
[im 21/35]
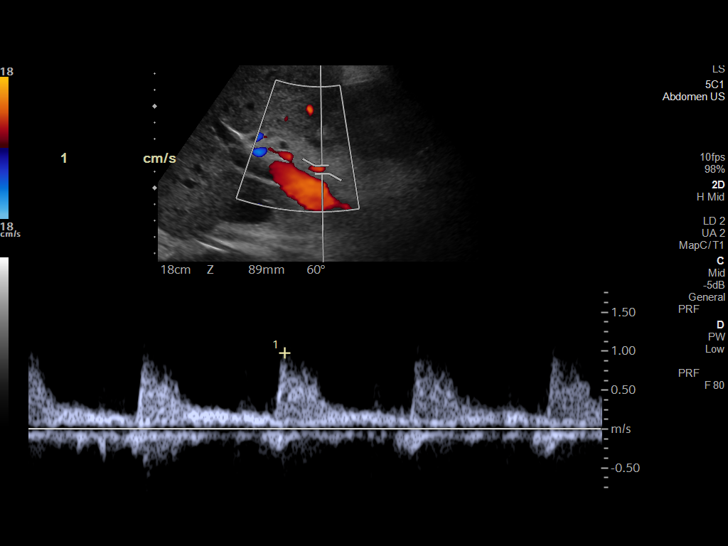
[im 24/35]
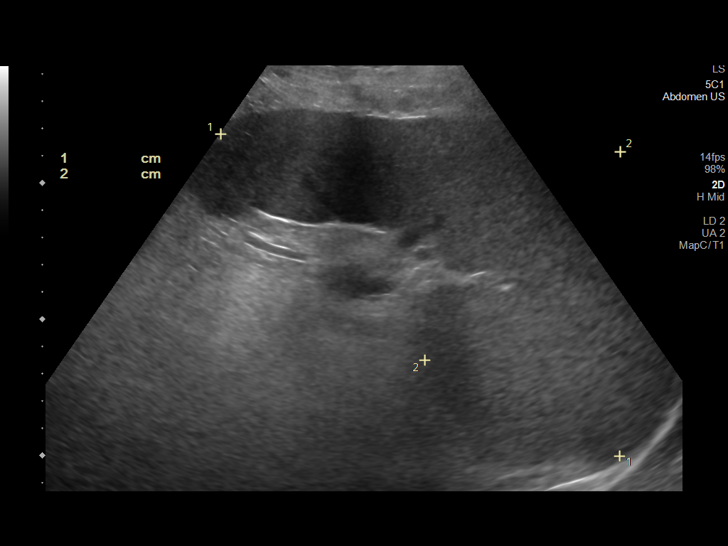
[im 27/35]
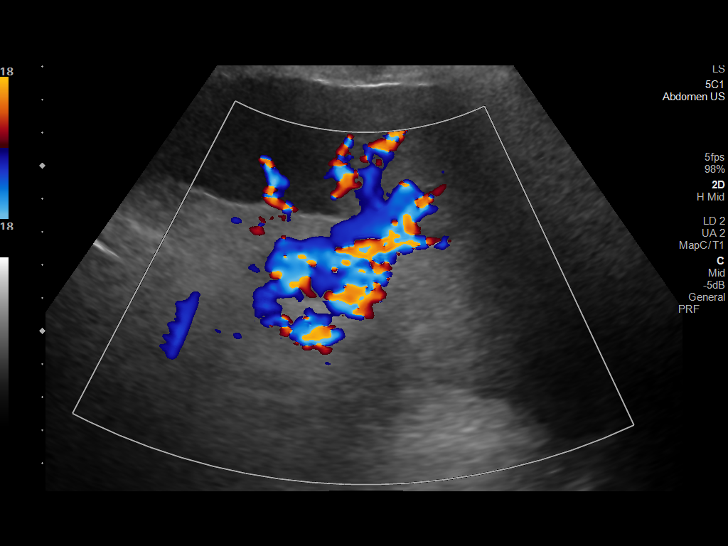
[im 30/35]
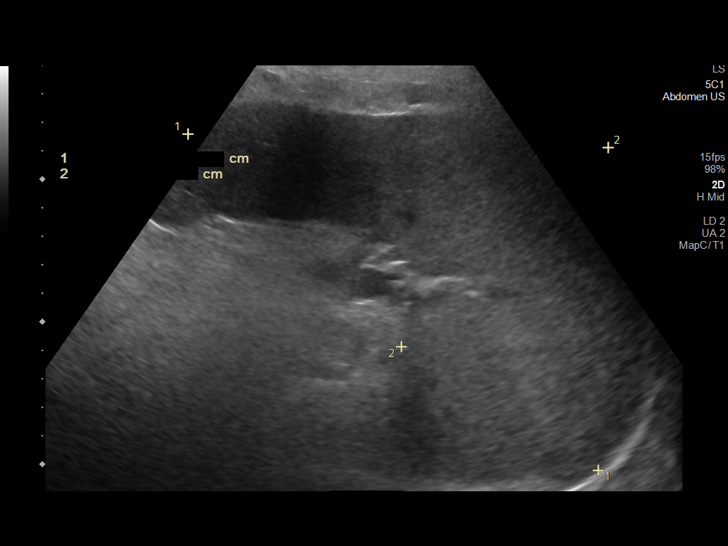
[im 33/35]
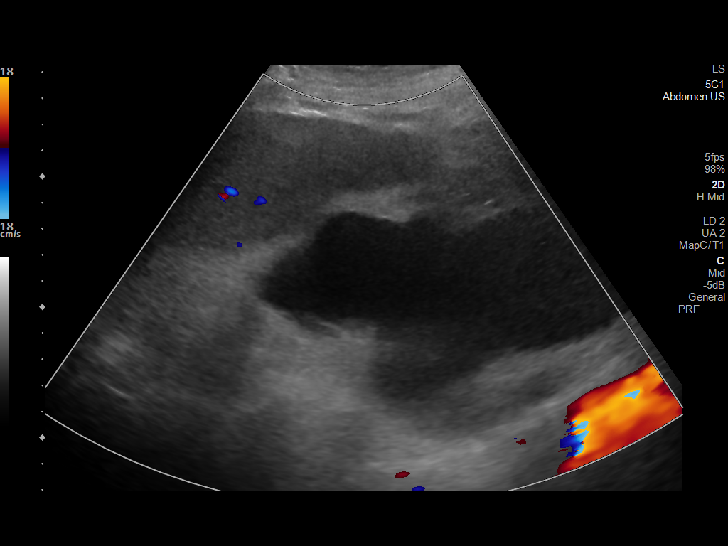

[Series 1001: abdomen us · 1 of 1 slices shown]
[im 1/1]
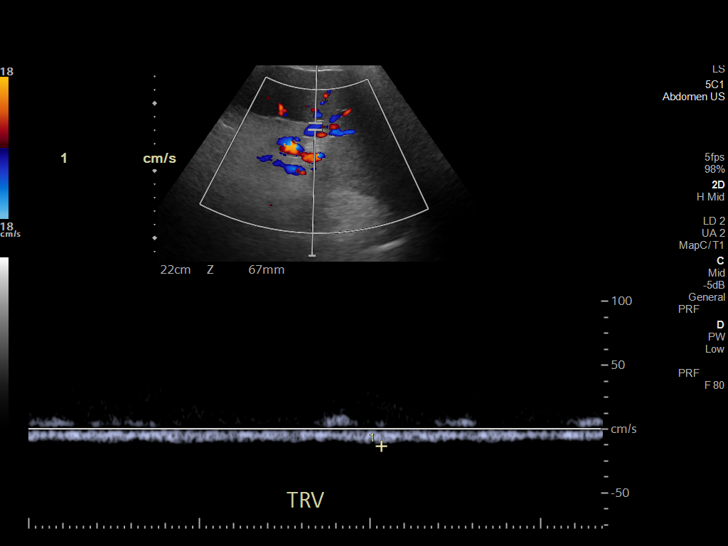

[13 of 25 positions shown; findings below may reference images not displayed]

FINDINGS: Liver: Normal parenchymal echogenicity. Normal hepatic contour
without nodularity.

No focal lesion, mass or intrahepatic biliary ductal dilatation.

Main Portal Vein size: 1.3 cm

Portal Vein Velocities

Main Prox:  34 cm/sec

Main Mid: 22 cm/sec

Main Dist:  21 cm/sec
Right: 22 cm/sec
Left: 17 cm/sec

Hepatic Vein Velocities

Right:  20 cm/sec

Middle:  17 cm/sec

Left:  18 cm/sec

IVC: Present and patent with normal respiratory phasicity.

Hepatic Artery Velocity:  98 cm/sec

Splenic Vein Velocity:  13 cm/sec

Spleen: 18.6 x 10.1 x 7.1 cm with a total volume of 695 cm^3 (411
cm^3 is upper limit normal)

Portal Vein Occlusion/Thrombus: No

Splenic Vein Occlusion/Thrombus: No

Ascites: None

Varices: None

Hydronephrotic a atrophic left kidney partially visualized.
IMPRESSION: No definite splenic varices identified. The spleen is enlarged.
Splenic vein is patent. If there is continued concern for splenic
varices, further evaluation with contrast enhanced abdominal CT
should be performed.

## 2022-04-23 MED ORDER — LACTULOSE 10 GM/15ML PO SOLN: 10.0000 g | Freq: Three times a day (TID) | ORAL | 2 refills | Status: DC

## 2022-04-23 NOTE — Telephone Encounter (Signed)
Patient is requesting that he be switched from Lactulose to the name brand Constulose as it seems to work better for him.  He uses the [facility] in [place].  Please advise.  Thank you.

## 2022-04-23 NOTE — Telephone Encounter (Signed)
Medication sent to patient's pharmacy.  

## 2022-05-03 ENCOUNTER — Ambulatory Visit
Admission: RE | Admit: 2022-05-03 | Discharge: 2022-05-03 | Disposition: A | Discharge status: Home / Self Care | Payer: No Typology Code available for payment source | Source: Ambulatory Visit | Attending: Radiology | Admitting: Radiology

## 2022-05-03 DIAGNOSIS — K7581 Nonalcoholic steatohepatitis (NASH): Secondary | ICD-10-CM

## 2022-05-03 NOTE — Progress Notes (Signed)
Patient ID: Corey Wood, male   DOB: [birth year], 72 y.o.   MRN: [id number]       Chief Complaint:  Cryptogenic cirrhosis, portal hypertension, variceal bleeding  Referring Physician(s): Omohundro,Jennifer C  History of Present Illness: Corey Wood is a 72 y.o. male with cirrhosis/NASH.  He also has portal hypertension resulting in esophageal varices, gastric varices and portal gastropathy.  For this, he underwent successful TIPS procedure at 03/26/2022  From a TIPS standpoint, he has not had any further variceal bleeding episodes, thoracentesis, or paracentesis.  Following the TIPS, he had a CT with contrast 04/01/2022 which confirmed patency of the TIPS with small bilateral pleural effusions.  No significant abdominal pelvic ascites.  No complicating feature.  Following the TIPS, he did experience worsening hepatic encephalopathy which required inpatient admission.  His medications were adjusted as an inpatient but he did require lactulose enemas to get his ammonia level finally down to 24 prior to discharge.  He remains on Xifaxan, lactulose, and zinc.  Overall, he is tolerating these medicines well with at least 3 bowel movements a day at home.  He has required ongoing physical therapy at home because of some deconditioning related to the procedure and the inpatient admission.  He has not had any recurrent significant hypotension, presyncopal spells, orthostatic symptoms, or syncope.  It appears that his Lasix and Aldactone diuretic therapy has also been decreased.  He does suffer from Parkinson's disease which by report from his wife continues to slowly progress.  Today, by telephone he seems to be fairly coherent and is eating breakfast.  He is able to carry on a conversation by telephone appropriately.  He reports no significant issues, recent illness, or pain.  No additional interval imaging to review.  Past Medical History:  Diagnosis Date   Allergy    seasonal allergies    Anxiety    on meds   Aortic valve disorder    Arthritis    generalized (fingers)(shoulders)   Asthma    uses inhaler   Cataract    bilateral -sx    Cirrhosis (HCC)    CKD (chronic kidney disease), stage III (HCC)    only has one kidney   COPD (chronic obstructive pulmonary disease) (HCC)    Depression    on meds   DM (diabetes mellitus) (HCC)    Type II   Dyspnea    GERD (gastroesophageal reflux disease)    on meds   Heart murmur    History of colon polyps    History of COVID-19 10/14/2020   History of kidney stones    Hx of acute pancreatitis 10/2019   Hyperlipidemia    on meds   Hypertension    on meds   Hypothyroidism    not on meds at this time   Myelodysplastic syndrome ([place])    Neuromuscular disorder ([place])    per pt   Obstructive sleep apnea    Oxygen deficiency    Parkinsonism    per pt report   Peripheral edema    bilateral legs   Peripheral positional vertigo    Sleep apnea    No CPAP   Stroke [facility])     Past Surgical History:  Procedure Laterality Date   ANKLE SURGERY     CHOLECYSTECTOMY     ENDARTERECTOMY Left 04/29/2020   Procedure: CAROTID EXPLORATION removal of  central venous catheter;  Surgeon: Rosetta Posner, MD;  Location: [place];  Service: Vascular;  Laterality: Left;  ESOPHAGEAL BANDING N/A 04/17/2020   Procedure: ESOPHAGEAL BANDING;  Surgeon: Yetta Flock, MD;  Location: [facility];  Service: Gastroenterology;  Laterality: N/A;   ESOPHAGEAL BANDING  04/29/2020   Procedure: ESOPHAGEAL BANDING;  Surgeon: Rush Landmark Telford Nab., MD;  Location: [place];  Service: Gastroenterology;;   ESOPHAGEAL BANDING N/A 06/10/2020   Procedure: ESOPHAGEAL BANDING;  Surgeon: Yetta Flock, MD;  Location: [facility];  Service: Gastroenterology;  Laterality: N/A;   ESOPHAGEAL BANDING N/A 11/10/2020   Procedure: ESOPHAGEAL BANDING;  Surgeon: Yetta Flock, MD;  Location: [facility];  Service: Gastroenterology;  Laterality: N/A;    ESOPHAGEAL BANDING N/A 02/10/2021   Procedure: ESOPHAGEAL BANDING;  Surgeon: Jerene Bears, MD;  [facility];  Service: Gastroenterology;  Laterality: N/A;   ESOPHAGEAL BANDING N/A 04/21/2021   Procedure: ESOPHAGEAL BANDING;  Surgeon: Milus Banister, MD;  [facility]: [facility];  Service: Endoscopy;  Laterality: N/A;   ESOPHAGEAL BANDING N/A 07/16/2021   Procedure: ESOPHAGEAL BANDING;  Surgeon: Ladene Artist, MD;  [facility]: [facility];  Service: Gastroenterology;  Laterality: N/A;   ESOPHAGEAL BANDING  01/18/2022   Procedure: ESOPHAGEAL BANDING;  Surgeon: Gatha Mayer, MD;  Location: [facility];  Service: Gastroenterology;;   ESOPHAGOGASTRODUODENOSCOPY N/A 04/25/2020   Procedure: ESOPHAGOGASTRODUODENOSCOPY (EGD);  Surgeon: Carol Ada, MD;  Location: [facility];  Service: Endoscopy;  Laterality: N/A;   ESOPHAGOGASTRODUODENOSCOPY (EGD) WITH PROPOFOL N/A 04/17/2020   Procedure: ESOPHAGOGASTRODUODENOSCOPY (EGD) WITH PROPOFOL;  Surgeon: Yetta Flock, MD;  [facility];  Service: Gastroenterology;  Laterality: N/A;   ESOPHAGOGASTRODUODENOSCOPY (EGD) WITH PROPOFOL N/A 04/29/2020   Procedure: ESOPHAGOGASTRODUODENOSCOPY (EGD) WITH PROPOFOL;  Surgeon: Rush Landmark Telford Nab., MD;  Location: [place];  Service: Gastroenterology;  Laterality: N/A;   ESOPHAGOGASTRODUODENOSCOPY (EGD) WITH PROPOFOL N/A 06/10/2020   Procedure: ESOPHAGOGASTRODUODENOSCOPY (EGD) WITH PROPOFOL;  Surgeon: Yetta Flock, MD;  [facility];  Service: Gastroenterology;  Laterality: N/A;   ESOPHAGOGASTRODUODENOSCOPY (EGD) WITH PROPOFOL N/A 11/10/2020   Procedure: ESOPHAGOGASTRODUODENOSCOPY (EGD) WITH PROPOFOL;  Surgeon: Yetta Flock, MD;  [facility];  Service: Gastroenterology;  Laterality: N/A;   ESOPHAGOGASTRODUODENOSCOPY (EGD) WITH PROPOFOL N/A 02/10/2021   Procedure: ESOPHAGOGASTRODUODENOSCOPY (EGD) WITH PROPOFOL;  Surgeon: Jerene Bears, MD;  [facility];  Service: Gastroenterology;  Laterality: N/A;   ESOPHAGOGASTRODUODENOSCOPY (EGD) WITH PROPOFOL N/A 03/31/2021   Procedure: ESOPHAGOGASTRODUODENOSCOPY (EGD) WITH PROPOFOL;  Surgeon: Yetta Flock, MD;  [facility];  Service: Gastroenterology;  Laterality: N/A;   ESOPHAGOGASTRODUODENOSCOPY (EGD) WITH PROPOFOL N/A 04/21/2021   Procedure: ESOPHAGOGASTRODUODENOSCOPY (EGD) WITH PROPOFOL;  Surgeon: Milus Banister, MD;  [facility];  Service: Endoscopy;  Laterality: N/A;   ESOPHAGOGASTRODUODENOSCOPY (EGD) WITH PROPOFOL N/A 07/16/2021   Procedure: ESOPHAGOGASTRODUODENOSCOPY (EGD) WITH PROPOFOL;  Surgeon: Ladene Artist, MD;  [facility];  Service: Gastroenterology;  Laterality: N/A;   ESOPHAGOGASTRODUODENOSCOPY (EGD) WITH PROPOFOL N/A 10/15/2021   Procedure: ESOPHAGOGASTRODUODENOSCOPY (EGD) WITH PROPOFOL;  Surgeon: Yetta Flock, MD;  [facility];  Service: Gastroenterology;  Laterality: N/A;   ESOPHAGOGASTRODUODENOSCOPY (EGD) WITH PROPOFOL N/A 01/18/2022   Procedure: ESOPHAGOGASTRODUODENOSCOPY (EGD) WITH PROPOFOL;  Surgeon: Gatha Mayer, MD;  Location: [place];  Service: Gastroenterology;  Laterality: N/A;   EYE SURGERY     HERNIA REPAIR     IR INTRAVASCULAR ULTRASOUND NON CORONARY  03/26/2022   IR PARACENTESIS  09/18/2020   IR RADIOLOGIST EVAL & MGMT  10/22/2021   IR THORACENTESIS ASP PLEURAL SPACE W/IMG GUIDE  10/05/2021   IR TIPS  03/26/2022  IR US GUIDE VASC ACCESS RIGHT  03/26/2022   KIDNEY STONE SURGERY     RADIOLOGY WITH ANESTHESIA N/A 03/26/2022   Procedure: TIPS;  Surgeon: Greggory Keen, MD;  Location: [place];  Service: Radiology;  Laterality: N/A;   WISDOM TOOTH EXTRACTION      Allergies: Lisinopril  Medications: Prior to Admission medications   Medication Sig Start Date End Date Taking? Authorizing Provider  acetaminophen (TYLENOL) 325 MG tablet Take 325 mg by mouth every 6 (six) hours as needed for moderate  pain.    [provider]  albuterol (VENTOLIN HFA) 108 (90 Base) MCG/ACT inhaler Inhale 1-2 puffs into the lungs every 6 (six) hours as needed for wheezing or shortness of breath.    [provider]  [facility] HFA 100 MCG/ACT AERO Inhale 1 puff into the lungs daily.    [provider]  carbidopa-levodopa (SINEMET IR) 25-100 MG tablet Take 1.5 tablets by mouth in the morning, at noon, and at bedtime. 06/30/21   [provider]  CODEINE SULFATE PO Take 5 mLs by mouth at bedtime.    [provider]  DULoxetine (CYMBALTA) 20 MG capsule Take 40 mg by mouth daily.    [provider]  ferrous sulfate 325 (65 FE) MG EC tablet Take 325 mg by mouth 3 (three) times a week. MWF    [provider]  fluticasone (FLONASE) 50 MCG/ACT nasal spray Place 1 spray into both nostrils daily as needed for allergies.    [provider]  furosemide (LASIX) 20 MG tablet Take 20 mg by mouth daily.    [provider]  guaiFENesin (DIABETIC TUSSIN EX) 100 MG/5ML liquid Take 10 mLs by mouth at bedtime.    [provider]  insulin glargine (LANTUS) 100 unit/mL SOPN Inject 16 Units into the skin daily.    [provider]  ipratropium (ATROVENT) 0.03 % nasal spray Place 1 spray into both nostrils at bedtime.    [provider]  lactulose (CHRONULAC) 10 GM/15ML solution Take 45 mLs (30 g total) by mouth 3 (three) times daily. Hold this medication if you have had greater than three loose stools in one day. 04/10/22   Shalhoub, Sherryll Burger, MD  lactulose (CONSTULOSE) 10 GM/15ML solution Take 15 mLs (10 g total) by mouth 3 (three) times daily. Take 45 mls three times daily. 04/23/22   Armbruster, Carlota Raspberry, MD  latanoprost (XALATAN) 0.005 % ophthalmic solution Place 1 drop into both eyes at bedtime. 08/20/20   [provider]  lidocaine (LIDODERM) 5 % Place 1 patch onto the skin daily as needed (pain). Remove & Discard patch within 12  hours or as directed by MD    [provider]  loratadine (CLARITIN) 10 MG tablet Take 10 mg by mouth daily. 09/04/20   [provider]  midodrine (PROAMATINE) 10 MG tablet Take 1 tablet (10 mg total) by mouth 3 (three) times daily. 10/21/21   Armbruster, Carlota Raspberry, MD  montelukast (SINGULAIR) 10 MG tablet Take 10 mg by mouth daily. 05/22/20   [provider]  Multiple Vitamin (MULTIVITAMIN WITH MINERALS) TABS tablet Take 1 tablet by mouth daily.    [provider]  pantoprazole (PROTONIX) 40 MG tablet Take 40 mg by mouth daily with breakfast.    [provider]  Polyvinyl Alcohol-Povidone PF (REFRESH) 1.4-0.6 % SOLN Apply 1 drop to eye daily as needed (dry eyes).    [provider]  rifaximin (XIFAXAN) 550 MG TABS tablet Take 1  tablet (550 mg total) by mouth 2 (two) times daily. 04/03/22   Mansouraty, Telford Nab., MD  spironolactone (ALDACTONE) 25 MG tablet Take 1 tablet (25 mg total) by mouth daily. 04/10/22   Shalhoub, Sherryll Burger, MD  tamsulosin (FLOMAX) 0.4 MG CAPS capsule Take 0.4 mg by mouth in the morning and at bedtime.    [provider]  traZODone (DESYREL) 150 MG tablet Take 75 mg by mouth at bedtime as needed for sleep. 09/28/19   [provider]  Zinc Oxide (TRIPLE PASTE) 12.8 % ointment Apply topically 2 (two) times daily. Apply to perianal region, folds of groin and both axilla 04/10/22   Shalhoub, Sherryll Burger, MD     Family History  Problem Relation Age of Onset   Liver cancer Mother    Colon cancer Neg Hx    Stomach cancer Neg Hx    Colon polyps Neg Hx    Esophageal cancer Neg Hx    Rectal cancer Neg Hx     Social History   Socioeconomic History   Marital status: Married    Spouse name: Not on file   Number of children: Not on file   Years of education: Not on file   Highest education level: Not on file  Occupational History   Occupation: retired  Tobacco Use   Smoking status: Never    Passive exposure: Never    Smokeless tobacco: Never  Vaping Use   Vaping Use: Never used  Substance and Sexual Activity   Alcohol use: Yes    Alcohol/week: 1.0 standard drink of alcohol    Types: 1 Cans of beer per week    Comment: rarely   Drug use: Never   Sexual activity: Not Currently  Other Topics Concern   Not on file  Social History Narrative   Not on file   Social Determinants of Health   Financial Resource Strain: Not on file  Food Insecurity: No Food Insecurity (04/02/2022)   Hunger Vital Sign    Worried About Running Out of Food in the Last Year: Never true    Ran Out of Food in the Last Year: Never true  Transportation Needs: No Transportation Needs (04/02/2022)   PRAPARE - Hydrologist (Medical): No    Lack of Transportation (Non-Medical): No  Physical Activity: Not on file  Stress: Not on file  Social Connections: Not on file     Review of Systems  Review of Systems: A 12 point ROS discussed and pertinent positives are indicated in the HPI above.  All other systems are negative.    Physical Exam No direct physical exam was performed   Telephone health visit only today  Vital Signs: There were no vitals taken for this visit.  Imaging: CT HEAD WO CONTRAST (5MM)  Result Date: 04/09/2022 CLINICAL DATA:  72 year old male with hepatic encephalopathy. Abnormal ammonia level. Neurologic deficit. EXAM: CT HEAD WITHOUT CONTRAST TECHNIQUE: Contiguous axial images were obtained from the base of the skull through the vertex without intravenous contrast. RADIATION DOSE REDUCTION: This exam was performed according to the departmental dose-optimization program which includes automated exposure control, adjustment of the mA and/or kV according to patient size and/or use of iterative reconstruction technique. COMPARISON:  Head CT 04/01/2022 and earlier. FINDINGS: Brain: Conspicuous basal ganglia dystrophic calcifications. No midline shift, ventriculomegaly, mass effect,  evidence of mass lesion, intracranial hemorrhage or evidence of cortically based acute infarction. Patchy and confluent bilateral cerebral white matter hypodensity appears stable. No  definite cortical encephalomalacia. Vascular: Calcified atherosclerosis at the skull base. No suspicious intracranial vascular hyperdensity. Incidental superior sagittal sinus arachnoid granulation (physiologic coronal image 26). Skull: No acute osseous abnormality identified. Sinuses/Orbits: Visualized paranasal sinuses and mastoids are clear. Other: No acute orbit or scalp soft tissue finding. IMPRESSION: 1. No acute intracranial abnormality. 2. Stable non contrast CT appearance of chronic small vessel disease. Electronically Signed   By: Genevie Ann M.D.   On: 04/09/2022 08:23   DG Abd 2 Views  Result Date: 04/06/2022 CLINICAL DATA:  Ileus EXAM: ABDOMEN - 2 VIEW COMPARISON:  Radiograph 04/04/2022, CT 04/01/2022 FINDINGS: There is persistent diffuse dilation of bowel, including the stomach, small bowel, and colon. Increased small bowel distension in comparison to prior. Right upper quadrant stent noted. Persistent bilateral pleural effusions. Degenerative changes of the spine. IMPRESSION: Persistent diffuse bowel dilation, increased from prior exam, compatible with ileus. Recommend continued radiographic follow-up. Electronically Signed   By: Maurine Simmering M.D.   On: 04/06/2022 09:06   DG Abd Portable 1V  Result Date: 04/04/2022 CLINICAL DATA:  Concern for ileus.  Passing less gas. EXAM: PORTABLE ABDOMEN - 1 VIEW COMPARISON:  01/17/2022 and CT 04/01/2022 FINDINGS: Gaseous distention of the stomach. Numerous gas-filled loops of small and large bowel in the abdomen which are normal in caliber. TIPS. Left left-greater-than-right pleural effusions and atelectasis/consolidation. IMPRESSION: Gaseous distention of the stomach. Gas-filled loops of small and large bowel without evidence of obstruction. Findings suggestive of ileus.  Left-greater-than-right pleural effusions and associated consolidation/atelectasis. Electronically Signed   By: Placido Sou M.D.   On: 04/04/2022 19:56   DG CHEST PORT 1 VIEW  Result Date: 04/03/2022 CLINICAL DATA:  Pleural effusions. EXAM: PORTABLE CHEST 1 VIEW COMPARISON:  None Available. FINDINGS: Bilateral pleural effusions persists, smaller in the interval. Cardiomediastinal silhouette is normal. No pneumothorax. No other acute abnormalities. IMPRESSION: Bilateral pleural effusions, smaller in the interval. No other changes. Electronically Signed   By: Dorise Bullion III M.D.   On: 04/03/2022 12:46    Labs:  CBC: Recent Labs    04/07/22 0343 04/08/22 0404 04/09/22 0334 04/10/22 0515  WBC 5.6 3.8* 3.3* 2.7*  HGB 10.3* 10.0* 9.3* 10.7*  HCT 31.6* 31.3* 27.5* 32.6*  PLT 94* 76* 83* 87*    COAGS: Recent Labs    01/17/22 1911 01/19/22 1623 03/26/22 1006 04/01/22 1900 04/02/22 0343 04/04/22 1021  INR 1.4*   < > 1.2 1.2 1.3* 1.4*  APTT 33  --   --   --   --   --    < > = values in this interval not displayed.    BMP: Recent Labs    04/07/22 0343 04/08/22 0404 04/09/22 0334 04/10/22 0515  NA 133* 133* 130* 132*  K 4.2 3.8 3.7 3.7  CL 104 105 102 103  CO2 22 22 21* 23  GLUCOSE 256* 148* 162* 145*  BUN 24* '19 14 12  '$ CALCIUM 9.8 9.4 9.5 9.8  CREATININE 1.25* 1.00 0.95 0.92  GFRNONAA >60 >60 >60 >60    LIVER FUNCTION TESTS: Recent Labs    04/07/22 0343 04/08/22 0404 04/09/22 0334 04/10/22 0515  BILITOT 1.3* 1.2 1.2 1.3*  AST 36 41 60* 63*  ALT '20 15 29 21  '$ ALKPHOS 96 91 95 101  PROT 6.5 6.1* 6.1* 6.8  ALBUMIN 2.2* 2.3* 2.2* 2.5*     Assessment and Plan:  Status post recent TIPS procedure performed 03/26/2022 at [facility], primarily for cryptogenic cirrhosis, episodes of variceal  bleeding requiring laparoscopic banding, pleural effusions considered to be hepatic hydrothorax, as well as intolerant to diuretic therapy with orthostatic  hypotension, near syncope, and recent falls.  Given the recent bout of encephalopathy requiring inpatient admission and the ongoing Parkinson's, I think he is back to his baseline at home this morning.  He seems to be mentating well and able to engage in conversation by telephone.  Overall no complaints.  No further episodes of orthostatic hypotension, syncope or falls.  Diuretic therapy appears to have been decreased.  Overall stable management of the encephalopathy requiring lactulose, Xifaxan, zinc.  He has follow-up scheduled with Dr. Havery Moros in GI on Thursday of this week.  Parkinson's follow-up with [facility] neurology is in early March.  I plan to schedule him for a outpatient TIPS ultrasound in April and see him in the office.    Electronically Signed: Greggory Keen 05/03/2022, 9:22 AM   I spent a total of    40 Minutes in remote  clinical consultation, greater than 50% of which was counseling/coordinating care for this patient status post recent TIPS procedure.    Visit type: Audio only (telephone). Audio (no video) only due to patient's lack of internet/smartphone capability. Alternative for in-person consultation at [facility], [address] [place], [place], [place]. This visit type was conducted due to national recommendations for restrictions regarding the COVID-19 Pandemic (e.g. social distancing).  This format is felt to be most appropriate for this patient at this time.  All issues noted in this document were discussed and addressed.

## 2022-05-05 ENCOUNTER — Other Ambulatory Visit (INDEPENDENT_AMBULATORY_CARE_PROVIDER_SITE_OTHER): Payer: No Typology Code available for payment source

## 2022-05-05 ENCOUNTER — Encounter: Payer: Self-pay | Admitting: Gastroenterology

## 2022-05-05 ENCOUNTER — Ambulatory Visit (INDEPENDENT_AMBULATORY_CARE_PROVIDER_SITE_OTHER): Payer: No Typology Code available for payment source | Admitting: Gastroenterology

## 2022-05-05 VITALS — BP 126/62 | HR 73

## 2022-05-05 DIAGNOSIS — I8510 Secondary esophageal varices without bleeding: Secondary | ICD-10-CM

## 2022-05-05 DIAGNOSIS — J948 Other specified pleural conditions: Secondary | ICD-10-CM

## 2022-05-05 DIAGNOSIS — K7682 Hepatic encephalopathy: Secondary | ICD-10-CM

## 2022-05-05 DIAGNOSIS — G20A1 Parkinson's disease without dyskinesia, without mention of fluctuations: Secondary | ICD-10-CM

## 2022-05-05 DIAGNOSIS — K7460 Unspecified cirrhosis of liver: Secondary | ICD-10-CM | POA: Diagnosis not present

## 2022-05-05 LAB — AMMONIA: Ammonia: 35 umol/L (ref 11–35)

## 2022-05-05 LAB — COMPREHENSIVE METABOLIC PANEL
ALT: 16 U/L (ref 0–53)
AST: 30 U/L (ref 0–37)
Albumin: 2.9 g/dL — ABNORMAL LOW (ref 3.5–5.2)
Alkaline Phosphatase: 143 U/L — ABNORMAL HIGH (ref 39–117)
BUN: 8 mg/dL (ref 6–23)
CO2: 31 mEq/L (ref 19–32)
Calcium: 10 mg/dL (ref 8.4–10.5)
Chloride: 100 mEq/L (ref 96–112)
Creatinine, Ser: 1.2 mg/dL (ref 0.40–1.50)
GFR: 59.86 mL/min — ABNORMAL LOW (ref >60.00)
Glucose, Bld: 187 mg/dL — ABNORMAL HIGH (ref 70–99)
Potassium: 3.7 mEq/L (ref 3.5–5.1)
Sodium: 137 mEq/L (ref 135–145)
Total Bilirubin: 0.9 mg/dL (ref 0.2–1.2)
Total Protein: 7.8 g/dL (ref 6.0–8.3)

## 2022-05-05 NOTE — Patient Instructions (Addendum)
If you are age 74 or older, your body mass index should be between 23-30. Your There is no height or weight on file to calculate BMI. If this is out of the aforementioned range listed, please consider follow up with your Primary Care Provider.  If you are age 89 or younger, your body mass index should be between 19-25. Your There is no height or weight on file to calculate BMI. If this is out of the aformentioned range listed, please consider follow up with your Primary Care Provider.   ________________________________________________________  Continue lactulose and Xifaxan  Please go to the lab in the basement of our building to have lab work done as you leave today. Hit "B" for basement when you get on the elevator.  When the doors open the lab is on your left.  We will call you with the results. Thank you.   Follow up with Hepatology.  We will refer you to Palliative Care. They will call you to schedule an appointment.    You have been scheduled for a follow up appointment with Dr. Havery Moros on Tuesday, 06-08-22 at 4:00 pm. Please arrive 10 minutes early for registration.  If you need to reschedule please call [phone] as soon as possible.  Thank you for entrusting me with your care and for choosing [facility], Dr. Yorkville Cellar

## 2022-05-05 NOTE — Progress Notes (Signed)
HPI :  72 year old male with a history of cirrhosis here for follow up accompanied by his wife and son today.    See prior notes for details of his case. 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 Has been followed by hepatology with [facility] in the past, followed also by multiple subspecialists at the [facility] for other medical problems. Course complicated by GI bleeding from large esophageal varices and hepatic hydrothorax.  Recall he has had extremely large varices / portal hypertensive gastritis - intolerant of beta-blockade due to bradycardia and hypotension, had elected for primary therapy of varices to be done with band ligation.  He has had a complicated course with post banding ulcer bleeding and refractory variceal bleeding > 40 bands placed over the past 2 years.  His course has also been complicated by hepatic hydrothorax. He has 1 kidney with some CKD, on low-dose diuretics, intolerant to higher dosing diuretics due to hypotension and recurrent falls in the setting of Parkinson's.  He has been on midodrine now to help with his blood pressure and that has been helping him. .Eventually after much discussion and a recurrent GI bleed from varices, had TIPS done on December 29.     At my last visit with him on January 4, he had progressively worsening hepatic encephalopathy.  He had falls at home, his wife did not think she could care for him due to his current mental state and fall risk.  He had been on lactulose twice daily but had not been moving his bowels well at all.  He was admitted directly to the hospital.  He was therefore 9 days.  They cannot find any other clear source for precipitant for his hepatic encephalopathy.  He had lactulose enemas, high-dose oral lactulose rifaximin, eventually over several days his ammonia levels trended down any got better enough where he could be discharged.  I had discussed his case with IR, they did not want to pursue  downsizing the TIPS as it was a small TIPS to begin with.  Evaluation by the inpatient service, and physical therapy etc., had recommended skilled nursing facility.  His wife strongly wanted to bring him home as the [place] would only cover a "1 or 2 star" outpatient facility and she did not feel comfortable with that.  He is currently home with PT, OT, home health nursing.  They also have a friend who spends her nights watching him at night, he sleeps in a hospital bed.  Fortunately his mental status has slowly improved and he appears much better today than he did before.  He can recognize where he is and answer questions appropriately.  He is having at least 3 bowel movements per day, sometimes 6.  He does not like taking lactulose but is compliant with it.  He is compliant with rifaximin twice daily.  His gait has been slightly altered, I wife thinks his right leg is turned in but he cannot really explain why he has been doing this.  He is due to see neurology for his Parkinson's next month.  He is also due to see hepatology in March 1.  He has not had any falls since he has been home.  Of note he was evaluated by palliative care during inpatient stay and they wish to pursue full care, however they did want outpatient evaluation they have not been by to see him.  He states his breathing has been fine since has been out of  the hospital.  He denies any complaints today.  No abdominal pain.     Prior workup: EGD 01/30/20 - Esophagogastric landmarks identified. - Large esophageal varices as described. - Erythematous mucosa in the gastric fundus and antrum, suspect portal hypertensive gastritis. Biopsies taken to rule out H pylori. - Suspected small type 2 gastroesophageal varices (GOV2, esophageal varices which extend along the fundus). - Normal duodenal bulb and second portion of the duodenum, very mild superficial erythema Noted.   Colonoscopy 01/30/20 - The perianal and digital rectal examinations were  normal. - The terminal ileum appeared normal. - A 5 to 6 mm polyp was found in the transverse colon. The polyp was flat. The polyp was removed with a cold snare. Resection and retrieval were complete. - Moderately congested mucosa was found in the entire colon, suspected due to portal hypertension. - Internal hemorrhoids were found during retroflexion. - The exam was otherwise without abnormality.   1. Surgical [P], gastric antrum and gastric body - CHRONIC GASTRITIS. - WARTHIN-STARRY IS NEGATIVE FOR HELICOBACTER PYLORI. - NO INTESTINAL METAPLASIA, DYSPLASIA, OR MALIGNANCY. 2. Surgical [P], colon, transverse, polyp - TUBULAR ADENOMA. - NO HIGH GRADE DYSPLASIA OR MALIGNANCY.     EGD 04/17/20 - Esophagogastric landmarks identified. - Large esophageal varices. Banded x 11. - Type 2 gastroesophageal varices (GOV2, esophageal varices which extend along the fundus). - Erythematous mucosa in the antrum. - Normal duodenal bulb and second portion of the duodenum.   EGD 04/25/20 - Large (> 5 mm) esophageal varices. - Esophageal ulcers with no stigmata of recent bleeding. - Portal hypertensive gastropathy. - A medium amount of food (residue) in the stomach. - Normal examined duodenum. - No specimens collected.   EGD 04/30/20 - No gross lesions in esophagus proximally. - Grade I, grade II and grade III esophageal varices as well as multiple post-banding ulcers with a few ulcers showing active oozing. Near completely eradicated after 6 bands were placed. No active extravasation noted thereafter. - Clotted blood in the entire stomach - suctioned and lavaged with mild clearance and did not see active re-accumulation of bleeding. - Type 2 gastroesophageal varices (GOV2, esophageal varices which extend along the fundus) - not completely visualized as had been at time of first endoscopy but no sing of active bleeding, but recent stigmata or nipple sign could have been missed due to blood  in fundus. - Blood in the duodenal bulb and in the second portion of the duodenum - lavaged away.   Echo 05/01/20 - EF 60-65%   EGD 06/10/20 - Esophagogastric landmarks identified. - Large esophageal varices but improved overall in size / appearance compared to initial exam. Banded x 6 in the lower to mid esophagus. - Type 2 gastroesophageal varices (GOV2, esophageal varices which extend along the fundus). - Portal hypertensive gastropathy. - Normal duodenal bulb and second portion of the duodenum.   Patient had a CVA in April, follow up EGD was cancelled   Ad[facility] to [facility]30[facility]20 for volume overload   EGD 11/10/20 - Esophagogastric landmarks identified. - Esophageal varices as described above - improvement in size over time but columns still persist, mostly medium in size, one large column. Banded x 6. - Portal hypertensive gastropathy. - Normal duodenal bulb and second portion of the duodenum.     EGD 02/10/21 - Grade II and small (< 5 mm) esophageal varices with no bleeding and no stigmata of recent bleeding. Banded x 3 today with good treatment response. Overall excellent response to EVL with now  small varices. - Portal hypertensive gastropathy. No gastric varices seen. - Normal examined duodenum.   RUQ [place] 09/23/20 - IMPRESSION: Cirrhotic liver morphology.  No focal hepatic lesion.     EGD 03/31/21 - retained food in the stomach   EGD 04/21/21 - Dr. Ardis Hughs Three trunks of Grade II varices were found in the distal esophagus with scar evidence of previous banding procedures. Six bands were successfully placed with complete eradication, resulting in deflation of varices. There was no bleeding during the procedure. Mild portal gastropathy changes throughout the stomach. No gastric varices   RUQ 02/27/21: IMPRESSION: 1. Cirrhotic morphology liver.  No sonographic evidence of hepatoma. 2. Common bile duct dilation, likely post cholecystectomy distention.      EGD 10/15/21: Esophagogastric landmarks identified. - Grade I esophageal varices with flattening - extensive scarring from prior banding, improved. No further banding performed today - Portal hypertensive gastropathy. - Normal duodenal bulb and second portion of the duodenum.     EGD 07/16/21: One column of grade II varices with no bleeding and no stigmata of recent bleeding were found in the distal esophagus,. They were 6 mm in largest diameter. No red wale signs were present. Scarring from prior treatment was visible. The varices appeared smaller than they were at prior exam. Two bands were successfully placed with complete eradication, resulting in deflation of varices. There was no bleeding during and at the end of the procedure.   The exam of the esophagus was otherwise normal. Moderate portal hypertensive gastropathy was found in the entire examined stomach. No gastric varices. The exam of the stomach was otherwise normal. The duodenal bulb and second portion of the duodenum were normal.      EGD 01/18/22: Grade II and large (> 5 mm) esophageal varices. Banded. x 4 some bleeding after banding of smaller varyx with nipple sign - it stopped. - Gastritis. - Portal hypertensive gastropathy. - The examination was otherwise normal. - No specimens collected.     Echocardiogram 01/19/22: EF 55-60%     US abdomen 01/19/22: IMPRESSION: 1. Stable findings of cirrhosis.  No focal liver abnormality. 2. Splenomegaly, compatible with portal venous hypertension. 3. Small amount of loculated ascites left upper quadrant. 4. Incidental small bilateral pleural effusions.    CT chest / abdomen / pelvis 04/01/22: ABDOMEN/PELVIS: 1. No acute abnormality in the abdomen or pelvis. 2. Interval TIPS creation, which appears patent. 3. Chronic left hydroureteronephrosis with atrophic parenchyma. Similar dependent 2 mm stone in the distal left ureter. 4. Unchanged splenomegaly. 5. Aortic  Atherosclerosis (ICD10-I70.0).   Past Medical History:  Diagnosis Date   Allergy    seasonal allergies   Anxiety    on meds   Aortic valve disorder    Arthritis    generalized (fingers)(shoulders)   Asthma    uses inhaler   Cataract    bilateral -sx    Cirrhosis (HCC)    CKD (chronic kidney disease), stage III (HCC)    only has one kidney   COPD (chronic obstructive pulmonary disease) (HCC)    Depression    on meds   DM (diabetes mellitus) (HCC)    Type II   Dyspnea    GERD (gastroesophageal reflux disease)    on meds   Heart murmur    History of colon polyps    History of COVID-19 10/14/2020   History of kidney stones    Hx of acute pancreatitis 10/2019   Hyperlipidemia    on meds   Hypertension  on meds   Hypothyroidism    not on meds at this time   Myelodysplastic syndrome ([place])    Neuromuscular disorder ([place])    per pt   Obstructive sleep apnea    Oxygen deficiency    Parkinsonism    per pt report   Peripheral edema    bilateral legs   Peripheral positional vertigo    Sleep apnea    No CPAP   Stroke [facility])      Past Surgical History:  Procedure Laterality Date   ANKLE SURGERY     CHOLECYSTECTOMY     ENDARTERECTOMY Left 04/29/2020   Procedure: CAROTID EXPLORATION removal of  central venous catheter;  Surgeon: Rosetta Posner, MD;  Location: [place];  Service: Vascular;  Laterality: Left;   ESOPHAGEAL BANDING N/A 04/17/2020   Procedure: ESOPHAGEAL BANDING;  Surgeon: Yetta Flock, MD;  Location: [facility];  Service: Gastroenterology;  Laterality: N/A;   ESOPHAGEAL BANDING  04/29/2020   Procedure: ESOPHAGEAL BANDING;  Surgeon: Rush Landmark Telford Nab., MD;  Location: [place];  Service: Gastroenterology;;   ESOPHAGEAL BANDING N/A 06/10/2020   Procedure: ESOPHAGEAL BANDING;  Surgeon: Yetta Flock, MD;  Location: [facility];  Service: Gastroenterology;  Laterality: N/A;   ESOPHAGEAL BANDING N/A 11/10/2020   Procedure: ESOPHAGEAL BANDING;   Surgeon: Yetta Flock, MD;  Location: [facility];  Service: Gastroenterology;  Laterality: N/A;   ESOPHAGEAL BANDING N/A 02/10/2021   Procedure: ESOPHAGEAL BANDING;  Surgeon: Jerene Bears, MD;  Location: [facility];  Service: Gastroenterology;  Laterality: N/A;   ESOPHAGEAL BANDING N/A 04/21/2021   Procedure: ESOPHAGEAL BANDING;  Surgeon: Milus Banister, MD;  Location: [facility];  Service: Endoscopy;  Laterality: N/A;   ESOPHAGEAL BANDING N/A 07/16/2021   Procedure: ESOPHAGEAL BANDING;  Surgeon: Ladene Artist, MD;  Location: [facility];  Service: Gastroenterology;  Laterality: N/A;   ESOPHAGEAL BANDING  01/18/2022   Procedure: ESOPHAGEAL BANDING;  Surgeon: Gatha Mayer, MD;  Location: [facility] END[facility]COPY;  Service: Gastroenterology;;   ESOPHAGOGASTRODUODENOSCOPY N/A 04/25/2020   Procedure: ESOPHAGOGASTRODUODENOSCOPY (EGD);  Surgeon: Carol Ada, MD;  Location: [facility];  Service: Endoscopy;  Laterality: N/A;   ESOPHAGOGASTRODUODENOSCOPY (EGD) WITH PROPOFOL N/A 04/17/2020   Procedure: ESOPHAGOGASTRODUODENOSCOPY (EGD) WITH PROPOFOL;  Surgeon: Yetta Flock, MD;  [facility];  Service: Gastroenterology;  Laterality: N/A;   ESOPHAGOGASTRODUODENOSCOPY (EGD) WITH PROPOFOL N/A 04/29/2020   Procedure: ESOPHAGOGASTRODUODENOSCOPY (EGD) WITH PROPOFOL;  Surgeon: Rush Landmark Telford Nab., MD;  Location: [place];  Service: Gastroenterology;  Laterality: N/A;   ESOPHAGOGASTRODUODENOSCOPY (EGD) WITH PROPOFOL N/A 06/10/2020   Procedure: ESOPHAGOGASTRODUODENOSCOPY (EGD) WITH PROPOFOL;  Surgeon: Yetta Flock, MD;  [facility];  Service: Gastroenterology;  Laterality: N/A;   ESOPHAGOGASTRODUODENOSCOPY (EGD) WITH PROPOFOL N/A 11/10/2020   Procedure: ESOPHAGOGASTRODUODENOSCOPY (EGD) WITH PROPOFOL;  Surgeon: Yetta Flock, MD;  [facility];  Service: Gastroenterology;  Laterality: N/A;   ESOPHAGOGASTRODUODENOSCOPY (EGD) WITH PROPOFOL N/A  02/10/2021   Procedure: ESOPHAGOGASTRODUODENOSCOPY (EGD) WITH PROPOFOL;  Surgeon: Jerene Bears, MD;  [facility];  Service: Gastroenterology;  Laterality: N/A;   ESOPHAGOGASTRODUODENOSCOPY (EGD) WITH PROPOFOL N/A 03/31/2021   Procedure: ESOPHAGOGASTRODUODENOSCOPY (EGD) WITH PROPOFOL;  Surgeon: Yetta Flock, MD;  [facility];  Service: Gastroenterology;  Laterality: N/A;   ESOPHAGOGASTRODUODENOSCOPY (EGD) WITH PROPOFOL N/A 04/21/2021   Procedure: ESOPHAGOGASTRODUODENOSCOPY (EGD) WITH PROPOFOL;  Surgeon: Milus Banister, MD;  [facility];  Service: Endoscopy;  Laterality: N/A;   ESOPHAGOGASTRODUODENOSCOPY (EGD) WITH PROPOFOL N/A 07/16/2021   Procedure: ESOPHAGOGASTRODUODENOSCOPY (EGD)  WITH PROPOFOL;  Surgeon: Ladene Artist, MD;  [facility];  Service: Gastroenterology;  Laterality: N/A;   ESOPHAGOGASTRODUODENOSCOPY (EGD) WITH PROPOFOL N/A 10/15/2021   Procedure: ESOPHAGOGASTRODUODENOSCOPY (EGD) WITH PROPOFOL;  Surgeon: Yetta Flock, MD;  Location: [facility];  Service: Gastroenterology;  Laterality: N/A;   ESOPHAGOGASTRODUODENOSCOPY (EGD) WITH PROPOFOL N/A 01/18/2022   Procedure: ESOPHAGOGASTRODUODENOSCOPY (EGD) WITH PROPOFOL;  Surgeon: Gatha Mayer, MD;  Location: [place];  Service: Gastroenterology;  Laterality: N/A;   EYE SURGERY     HERNIA REPAIR     IR INTRAVASCULAR ULTRASOUND NON CORONARY  03/26/2022   IR PARACENTESIS  09/18/2020   IR RADIOLOGIST EVAL & MGMT  10/22/2021   IR THORACENTESIS ASP PLEURAL SPACE W/IMG GUIDE  10/05/2021   IR TIPS  03/26/2022   IR US GUIDE VASC ACCESS RIGHT  03/26/2022   KIDNEY STONE SURGERY     RADIOLOGY WITH ANESTHESIA N/A 03/26/2022   Procedure: TIPS;  Surgeon: Greggory Keen, MD;  Location: [place];  Service: Radiology;  Laterality: N/A;   WISDOM TOOTH EXTRACTION     Family History  Problem Relation Age of Onset   Liver cancer Mother    Colon cancer Neg Hx    Stomach cancer Neg Hx    Colon  polyps Neg Hx    Esophageal cancer Neg Hx    Rectal cancer Neg Hx    Social History   Tobacco Use   Smoking status: Never    Passive exposure: Never   Smokeless tobacco: Never  Vaping Use   Vaping Use: Never used  Substance Use Topics   Alcohol use: Yes    Alcohol/week: 1.0 standard drink of alcohol    Types: 1 Cans of beer per week    Comment: rarely   Drug use: Never   Current Outpatient Medications  Medication Sig Dispense Refill   acetaminophen (TYLENOL) 325 MG tablet Take 325 mg by mouth every 6 (six) hours as needed for moderate pain.     albuterol (VENTOLIN HFA) 108 (90 Base) MCG/ACT inhaler Inhale 1-2 puffs into the lungs every 6 (six) hours as needed for wheezing or shortness of breath.     ASMANEX HFA 100 MCG/ACT AERO Inhale 1 puff into the lungs daily.     carbidopa-levodopa (SINEMET IR) 25-100 MG tablet Take 1.5 tablets by mouth in the morning, at noon, and at bedtime.     CODEINE SULFATE PO Take 5 mLs by mouth at bedtime.     DULoxetine (CYMBALTA) 20 MG capsule Take 40 mg by mouth daily.     ferrous sulfate 325 (65 FE) MG EC tablet Take 325 mg by mouth 3 (three) times a week. MWF     fluticasone (FLONASE) 50 MCG/ACT nasal spray Place 1 spray into both nostrils daily as needed for allergies.     furosemide (LASIX) 20 MG tablet Take 20 mg by mouth daily.     guaiFENesin (DIABETIC TUSSIN EX) 100 MG/5ML liquid Take 10 mLs by mouth at bedtime.     insulin glargine (LANTUS) 100 unit/mL SOPN Inject 16 Units into the skin daily.     ipratropium (ATROVENT) 0.03 % nasal spray Place 1 spray into both nostrils at bedtime.     lactulose (CHRONULAC) 10 GM/15ML solution Take 45 mLs (30 g total) by mouth 3 (three) times daily. Hold this medication if you have had greater than three loose stools in one day. 946 mL 2   lactulose (CONSTULOSE) 10 GM/15ML solution Take 15 mLs (10 g total) by  mouth 3 (three) times daily. Take 45 mls three times daily. 946 mL 2   latanoprost (XALATAN) 0.005  % ophthalmic solution Place 1 drop into both eyes at bedtime.     lidocaine (LIDODERM) 5 % Place 1 patch onto the skin daily as needed (pain). Remove & Discard patch within 12 hours or as directed by MD     loratadine (CLARITIN) 10 MG tablet Take 10 mg by mouth daily.     midodrine (PROAMATINE) 10 MG tablet Take 1 tablet (10 mg total) by mouth 3 (three) times daily.     montelukast (SINGULAIR) 10 MG tablet Take 10 mg by mouth daily.     Multiple Vitamin (MULTIVITAMIN WITH MINERALS) TABS tablet Take 1 tablet by mouth daily.     pantoprazole (PROTONIX) 40 MG tablet Take 40 mg by mouth daily with breakfast.     Polyvinyl Alcohol-Povidone PF (REFRESH) 1.4-0.6 % SOLN Apply 1 drop to eye daily as needed (dry eyes).     rifaximin (XIFAXAN) 550 MG TABS tablet Take 1 tablet (550 mg total) by mouth 2 (two) times daily. 60 tablet 6   spironolactone (ALDACTONE) 25 MG tablet Take 1 tablet (25 mg total) by mouth daily. 30 tablet 1   tamsulosin (FLOMAX) 0.4 MG CAPS capsule Take 0.4 mg by mouth in the morning and at bedtime.     traZODone (DESYREL) 150 MG tablet Take 75 mg by mouth at bedtime as needed for sleep.     Zinc Oxide (TRIPLE PASTE) 12.8 % ointment Apply topically 2 (two) times daily. Apply to perianal region, folds of groin and both axilla 56.7 g 2   No current facility-administered medications for this visit.   Allergies  Allergen Reactions   Lisinopril Cough     Review of Systems: All systems reviewed and negative except where noted in HPI.   Lab Results  Component Value Date   WBC 2.7 (L) 04/10/2022   HGB 10.7 (L) 04/10/2022   HCT 32.6 (L) 04/10/2022   MCV 95.6 04/10/2022   PLT 87 (L) 04/10/2022    Lab Results  Component Value Date   CREATININE 0.92 04/10/2022   BUN 12 04/10/2022   NA 132 (L) 04/10/2022   K 3.7 04/10/2022   CL 103 04/10/2022   CO2 23 04/10/2022    Lab Results  Component Value Date   ALT 21 04/10/2022   AST 63 (H) 04/10/2022   ALKPHOS 101 04/10/2022    BILITOT 1.3 (H) 04/10/2022   Lab Results  Component Value Date   INR 1.4 (H) 04/04/2022   INR 1.3 (H) 04/02/2022   INR 1.2 04/01/2022   MELD 3.0: 20 at 04/06/2022  3:58 AM MELD-Na: 21 at 04/06/2022  3:58 AM Calculated from: Serum Creatinine: 1.33 mg/dL at 04/06/2022  3:58 AM Serum Sodium: 131 mmol/L at 04/06/2022  3:58 AM Total Bilirubin: 2.2 mg/dL at 04/06/2022  3:58 AM Serum Albumin: 3.2 g/dL at 04/06/2022  3:58 AM INR(ratio): 1.4 at 04/04/2022 10:21 AM Age at listing (hypothetical): 72 years Sex: Male at 04/06/2022  3:58 AM    Physical Exam: BP 126/62   Pulse 73  Constitutional: Pleasant,male in no acute distress. Neurological: Alert and oriented to person place and time. Psychiatric: Normal mood and affect. Behavior is normal.   ASSESSMENT: 72 y.o. male here for assessment of the following  1. Hepatic encephalopathy ([place])   2. Hepatic cirrhosis, unspecified hepatic cirrhosis type, unspecified whether ascites present ([place])   3. Esophageal varices in cirrhosis ([place])  4. Hydrothorax   5. Parkinson's disease, unspecified whether dyskinesia present, unspecified whether manifestations fluctuate    As above, complicated course with refractory variceal bleeding despite numerous bandings and persistent hydrothorax.  Eventually led to TIPS, unfortunately that was complicated by development of hepatic encephalopathy in this patient who has Parkinson's and it took him a very long time to recover with aggressive measures to treat this, led to hospitalization for 9 days.  He is now on a good course of lactulose and rifaximin, TIPS is patent.  Radiology does not feel reducing TIPS size or removing it is needed at this time.  He is significantly improved when I last saw him, his mental status is much improved, family is doing a good job keeping on top of his medications to treat this.  His gait remains fragile and at risk for falls.  They have a lot of help at home fortunately, he continues to work with  PT.  He does need to follow-up with his neurologist for Parkinson's management as they have been adjusting his medicines for that which I think is more so affecting his gait than his encephalopathy.  His breathing has been much better since the TIPS, I think that is helping his hydrothorax.  No further bleeding symptoms since the TIPS.  For now, continue aggressive management of encephalopathy, he will continue lactulose to at least 3 bowel movements per day.  Will recheck his c-Met today as well as ammonia level to make sure okay.  He is due to see hepatology in March 1 although is not a good transplant candidate given his comorbidities.  He did see palliative care in the hospital and they would like to have them and evolved at home and we will place that referral for them.  I will follow him closely and see him again in 1 month.  All questions answered.   PLAN: - continue lactulose - continue rifaximin - on low dose lasix / aldactone - continue midocrine - labs today - CMET and ammonia level - follow up with hepatology March 1st - follow up with Neurology for Parkinson's - f/u me 1 month - paperwork done today for handicap space - palliative care consultation  Jolly Mango, MD [facility]

## 2022-06-04 ENCOUNTER — Telehealth: Payer: Self-pay | Admitting: Gastroenterology

## 2022-06-04 NOTE — Telephone Encounter (Signed)
Inbound call from patient's wife, in regards to patients upcoming appointment with Dr. Havery Moros on 3/12. She states paperwork needs to be faxed to the [place] in order to get an updated [place] for the visit. She states the authorization has expired and a new one is needed and the [place] is requesting documents to be sent to them. Please advise.

## 2022-06-07 DIAGNOSIS — I8510 Secondary esophageal varices without bleeding: Secondary | ICD-10-CM | POA: Diagnosis not present

## 2022-06-07 DIAGNOSIS — K7682 Hepatic encephalopathy: Secondary | ICD-10-CM | POA: Diagnosis not present

## 2022-06-07 DIAGNOSIS — K7460 Unspecified cirrhosis of liver: Secondary | ICD-10-CM | POA: Diagnosis not present

## 2022-06-07 DIAGNOSIS — E440 Moderate protein-calorie malnutrition: Secondary | ICD-10-CM | POA: Diagnosis not present

## 2022-06-07 DIAGNOSIS — J948 Other specified pleural conditions: Secondary | ICD-10-CM | POA: Diagnosis not present

## 2022-06-07 DIAGNOSIS — K7581 Nonalcoholic steatohepatitis (NASH): Secondary | ICD-10-CM | POA: Diagnosis not present

## 2022-06-07 NOTE — Telephone Encounter (Addendum)
Called and spoke to spouse. They have reached out to the [place] and the office is working on getting the Authorization. Advised them that its needed to ensure visit is covered and it must be dated with 06-08-22 date. She verbalized understanding and states they are coming to the visit even if she has to pay out of pocket.Corey Wood is very happy with [facility] and Dr Havery Moros and is not changing care anywhere else.

## 2022-06-08 ENCOUNTER — Other Ambulatory Visit (INDEPENDENT_AMBULATORY_CARE_PROVIDER_SITE_OTHER): Payer: Medicare PPO

## 2022-06-08 ENCOUNTER — Ambulatory Visit (INDEPENDENT_AMBULATORY_CARE_PROVIDER_SITE_OTHER): Payer: Medicare PPO | Admitting: Gastroenterology

## 2022-06-08 VITALS — BP 114/66 | HR 89 | Ht 66.0 in

## 2022-06-08 DIAGNOSIS — R296 Repeated falls: Secondary | ICD-10-CM | POA: Diagnosis not present

## 2022-06-08 DIAGNOSIS — Z9181 History of falling: Secondary | ICD-10-CM | POA: Diagnosis not present

## 2022-06-08 DIAGNOSIS — I8510 Secondary esophageal varices without bleeding: Secondary | ICD-10-CM

## 2022-06-08 DIAGNOSIS — K7460 Unspecified cirrhosis of liver: Secondary | ICD-10-CM

## 2022-06-08 DIAGNOSIS — G20A1 Parkinson's disease without dyskinesia, without mention of fluctuations: Secondary | ICD-10-CM

## 2022-06-08 DIAGNOSIS — K7682 Hepatic encephalopathy: Secondary | ICD-10-CM

## 2022-06-08 DIAGNOSIS — G20C Parkinsonism, unspecified: Secondary | ICD-10-CM | POA: Diagnosis not present

## 2022-06-08 DIAGNOSIS — R188 Other ascites: Secondary | ICD-10-CM

## 2022-06-08 LAB — CBC WITH DIFFERENTIAL/PLATELET
Basophils Absolute: 0 10*3/uL (ref 0.0–0.1)
Basophils Relative: 1.2 % (ref 0.0–3.0)
Eosinophils Absolute: 0.2 10*3/uL (ref 0.0–0.7)
Eosinophils Relative: 5.2 % — ABNORMAL HIGH (ref 0.0–5.0)
HCT: 30.3 % — ABNORMAL LOW (ref 39.0–52.0)
Hemoglobin: 10.6 g/dL — ABNORMAL LOW (ref 13.0–17.0)
Lymphocytes Relative: 15.8 % (ref 12.0–46.0)
Lymphs Abs: 0.6 10*3/uL — ABNORMAL LOW (ref 0.7–4.0)
MCHC: 34.9 g/dL (ref 30.0–36.0)
MCV: 97.6 fl (ref 78.0–100.0)
Monocytes Absolute: 0.3 10*3/uL (ref 0.1–1.0)
Monocytes Relative: 7.4 % (ref 3.0–12.0)
Neutro Abs: 2.7 10*3/uL (ref 1.4–7.7)
Neutrophils Relative %: 70.4 % (ref 43.0–77.0)
Platelets: 84 10*3/uL — ABNORMAL LOW (ref 150.0–400.0)
RBC: 3.1 Mil/uL — ABNORMAL LOW (ref 4.22–5.81)
RDW: 16.7 % — ABNORMAL HIGH (ref 11.5–15.5)
WBC: 3.8 10*3/uL — ABNORMAL LOW (ref 4.0–10.5)

## 2022-06-08 LAB — COMPREHENSIVE METABOLIC PANEL
ALT: 8 U/L (ref 0–53)
AST: 36 U/L (ref 0–37)
Albumin: 2.6 g/dL — ABNORMAL LOW (ref 3.5–5.2)
Alkaline Phosphatase: 132 U/L — ABNORMAL HIGH (ref 39–117)
BUN: 12 mg/dL (ref 6–23)
CO2: 32 mEq/L (ref 19–32)
Calcium: 9.8 mg/dL (ref 8.4–10.5)
Chloride: 98 mEq/L (ref 96–112)
Creatinine, Ser: 1.28 mg/dL (ref 0.40–1.50)
GFR: 55.37 mL/min — ABNORMAL LOW (ref >60.00)
Glucose, Bld: 171 mg/dL — ABNORMAL HIGH (ref 70–99)
Potassium: 3.7 mEq/L (ref 3.5–5.1)
Sodium: 134 mEq/L — ABNORMAL LOW (ref 135–145)
Total Bilirubin: 0.8 mg/dL (ref 0.2–1.2)
Total Protein: 7.2 g/dL (ref 6.0–8.3)

## 2022-06-08 LAB — PROTIME-INR
INR: 1.2 ratio — ABNORMAL HIGH (ref 0.8–1.0)
Prothrombin Time: 12.8 s (ref 9.6–13.1)

## 2022-06-08 NOTE — Progress Notes (Signed)
HPI :  72 year old male with a history of cirrhosis here for follow up accompanied by his wife and son today.    See prior notes for details of his case. 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 Has been followed by hepatology with [place] in the past, followed also by multiple subspecialists at the [facility] for other medical problems. Course complicated by GI bleeding from large esophageal varices and hepatic hydrothorax.   Recall he has had extremely large varices / portal hypertensive gastritis - intolerant of beta-blockade due to bradycardia and hypotension, had elected for primary therapy of varices to be done with band ligation.  He has had a complicated course with post banding ulcer bleeding and refractory variceal bleeding > 40 bands placed over the past 2 years.  His course has also been complicated by hepatic hydrothorax. He has 1 kidney with some CKD, on low-dose diuretics, intolerant to higher dosing diuretics due to hypotension and recurrent falls in the setting of Parkinson's.  He has been on midodrine now to help with his blood pressure and that has been helping him. .Eventually after much discussion and a recurrent GI bleed from varices, had TIPS done on December 29.     Recall his post TIPS course was complicated by worsening hepatic encephalopathy which led to admission. With time he has been improving.  He has been compliant with his lactulose and rifaximin.  He saw him a little over a month ago.  Wife reports that he is doing okay with the regimen and while he can be fatigued later in the day and memory issues at times, and generally he has been better than previous in regards to the encephalopathy.  Since he had TIPS, his breathing has been much better.  He is no longer on oxygen.  Ascites resolved.  He does not really have much lower extremity edema.  He continues to take Lasix 20 mg and Aldactone 25 mg daily.  Recall he is been on midodrine to help  with his blood pressure with this.  Main issue that has been bothering him has been recurrent falls.  He had another fall about 2 days ago, hurt his left shoulder.  Seen at the ED and no fracture but he is very sore and not able to move it too well.  He has been followed by neurology at [facility], they have been adjusting his Parkinson's meds, they do think his Parkinson's is worsening recently.  He has been evaluated for palliative care however there is no palliative care physician and their area.  They do have home nurse who comes in to help them. Seen by [name] of hepatology yesterday, she was concerned about his sarcopenia and falls as well, recommended he increase protein in his diet.  He has been started on protein shakes.   Prior workup: EGD 01/30/20 - Esophagogastric landmarks identified. - Large esophageal varices as described. - Erythematous mucosa in the gastric fundus and antrum, suspect portal hypertensive gastritis. Biopsies taken to rule out H pylori. - Suspected small type 2 gastroesophageal varices (GOV2, esophageal varices which extend along the fundus). - Normal duodenal bulb and second portion of the duodenum, very mild superficial erythema Noted.   Colonoscopy 01/30/20 - The perianal and digital rectal examinations were normal. - The terminal ileum appeared normal. - A 5 to 6 mm polyp was found in the transverse colon. The polyp was flat. The polyp was removed with a cold snare. Resection and retrieval  were complete. - Moderately congested mucosa was found in the entire colon, suspected due to portal hypertension. - Internal hemorrhoids were found during retroflexion. - The exam was otherwise without abnormality.   1. Surgical [P], gastric antrum and gastric body - CHRONIC GASTRITIS. - WARTHIN-STARRY IS NEGATIVE FOR HELICOBACTER PYLORI. - NO INTESTINAL METAPLASIA, DYSPLASIA, OR MALIGNANCY. 2. Surgical [P], colon, transverse, polyp - TUBULAR ADENOMA. - NO HIGH  GRADE DYSPLASIA OR MALIGNANCY.     EGD 04/17/20 - Esophagogastric landmarks identified. - Large esophageal varices. Banded x 11. - Type 2 gastroesophageal varices (GOV2, esophageal varices which extend along the fundus). - Erythematous mucosa in the antrum. - Normal duodenal bulb and second portion of the duodenum.   EGD 04/25/20 - Large (> 5 mm) esophageal varices. - Esophageal ulcers with no stigmata of recent bleeding. - Portal hypertensive gastropathy. - A medium amount of food (residue) in the stomach. - Normal examined duodenum. - No specimens collected.   EGD 04/30/20 - No gross lesions in esophagus proximally. - Grade I, grade II and grade III esophageal varices as well as multiple post-banding ulcers with a few ulcers showing active oozing. Near completely eradicated after 6 bands were placed. No active extravasation noted thereafter. - Clotted blood in the entire stomach - suctioned and lavaged with mild clearance and did not see active re-accumulation of bleeding. - Type 2 gastroesophageal varices (GOV2, esophageal varices which extend along the fundus) - not completely visualized as had been at time of first endoscopy but no sing of active bleeding, but recent stigmata or nipple sign could have been missed due to blood in fundus. - Blood in the duodenal bulb and in the second portion of the duodenum - lavaged away.   Echo 05/01/20 - EF 60-65%   EGD 06/10/20 - Esophagogastric landmarks identified. - Large esophageal varices but improved overall in size / appearance compared to initial exam. Banded x 6 in the lower to mid esophagus. - Type 2 gastroesophageal varices (GOV2, esophageal varices which extend along the fundus). - Portal hypertensive gastropathy. - Normal duodenal bulb and second portion of the duodenum.   Patient had a CVA in April, follow up EGD was cancelled   Ad[facility] to [facility]08[facility]30[facility]20 for volume overload   EGD 11/10/20 - Esophagogastric  landmarks identified. - Esophageal varices as described above - improvement in size over time but columns still persist, mostly medium in size, one large column. Banded x 6. - Portal hypertensive gastropathy. - Normal duodenal bulb and second portion of the duodenum.     EGD 02/10/21 - Grade II and small (< 5 mm) esophageal varices with no bleeding and no stigmata of recent bleeding. Banded x 3 today with good treatment response. Overall excellent response to EVL with now small varices. - Portal hypertensive gastropathy. No gastric varices seen. - Normal examined duodenum.   RUQ [place] 09/23/20 - IMPRESSION: Cirrhotic liver morphology.  No focal hepatic lesion.     EGD 03/31/21 - retained food in the stomach   EGD 04/21/21 - Dr. Ardis Hughs Three trunks of Grade II varices were found in the distal esophagus with scar evidence of previous banding procedures. Six bands were successfully placed with complete eradication, resulting in deflation of varices. There was no bleeding during the procedure. Mild portal gastropathy changes throughout the stomach. No gastric varices   RUQ 02/27/21: IMPRESSION: 1. Cirrhotic morphology liver.  No sonographic evidence of hepatoma. 2. Common bile duct dilation, likely post cholecystectomy distention.     EGD 10/15/21:  Esophagogastric landmarks identified. - Grade I esophageal varices with flattening - extensive scarring from prior banding, improved. No further banding performed today - Portal hypertensive gastropathy. - Normal duodenal bulb and second portion of the duodenum.     EGD 07/16/21: One column of grade II varices with no bleeding and no stigmata of recent bleeding were found in the distal esophagus,. They were 6 mm in largest diameter. No red wale signs were present. Scarring from prior treatment was visible. The varices appeared smaller than they were at prior exam. Two bands were successfully placed with complete eradication, resulting in  deflation of varices. There was no bleeding during and at the end of the procedure.   The exam of the esophagus was otherwise normal. Moderate portal hypertensive gastropathy was found in the entire examined stomach. No gastric varices. The exam of the stomach was otherwise normal. The duodenal bulb and second portion of the duodenum were normal.      EGD 01/18/22: Grade II and large (> 5 mm) esophageal varices. Banded. x 4 some bleeding after banding of smaller varyx with nipple sign - it stopped. - Gastritis. - Portal hypertensive gastropathy. - The examination was otherwise normal. - No specimens collected.     Echocardiogram 01/19/22: EF 55-60%     US abdomen 01/19/22: IMPRESSION: 1. Stable findings of cirrhosis.  No focal liver abnormality. 2. Splenomegaly, compatible with portal venous hypertension. 3. Small amount of loculated ascites left upper quadrant. 4. Incidental small bilateral pleural effusions.     CT chest / abdomen / pelvis 04/01/22: ABDOMEN/PELVIS: 1. No acute abnormality in the abdomen or pelvis. 2. Interval TIPS creation, which appears patent. 3. Chronic left hydroureteronephrosis with atrophic parenchyma. Similar dependent 2 mm stone in the distal left ureter. 4. Unchanged splenomegaly. 5. Aortic Atherosclerosis (ICD10-I70.0).  05/05/22 - ammonia normal, CMET looked better  Past Medical History:  Diagnosis Date   Allergy    seasonal allergies   Anxiety    on meds   Aortic valve disorder    Arthritis    generalized (fingers)(shoulders)   Asthma    uses inhaler   Cataract    bilateral -sx    Cirrhosis (HCC)    CKD (chronic kidney disease), stage III (HCC)    only has one kidney   COPD (chronic obstructive pulmonary disease) (HCC)    Depression    on meds   DM (diabetes mellitus) (HCC)    Type II   Dyspnea    GERD (gastroesophageal reflux disease)    on meds   Heart murmur    History of colon polyps    History of COVID-19 10/14/2020    History of kidney stones    Hx of acute pancreatitis 10/2019   Hyperlipidemia    on meds   Hypertension    on meds   Hypothyroidism    not on meds at this time   Myelodysplastic syndrome ([place])    Neuromuscular disorder ([place])    per pt   Obstructive sleep apnea    Oxygen deficiency    Parkinsonism    per pt report   Peripheral edema    bilateral legs   Peripheral positional vertigo    Sleep apnea    No CPAP   Stroke [facility])      Past Surgical History:  Procedure Laterality Date   ANKLE SURGERY     CHOLECYSTECTOMY     ENDARTERECTOMY Left 04/29/2020   Procedure: CAROTID EXPLORATION removal of  central venous catheter;  Surgeon: Rosetta Posner, MD;  Location: [facility] OR;  Service: Vascular;  Laterality: Left;   ESOPHAGEAL BANDING N/A 04/17/2020   Procedure: ESOPHAGEAL BANDING;  Surgeon: Yetta Flock, MD;  Location: [facility];  Service: Gastroenterology;  Laterality: N/A;   ESOPHAGEAL BANDING  04/29/2020   Procedure: ESOPHAGEAL BANDING;  Surgeon: Rush Landmark Telford Nab., MD;  Location: [place];  Service: Gastroenterology;;   ESOPHAGEAL BANDING N/A 06/10/2020   Procedure: ESOPHAGEAL BANDING;  Surgeon: Yetta Flock, MD;  Location: [facility];  Service: Gastroenterology;  Laterality: N/A;   ESOPHAGEAL BANDING N/A 11/10/2020   Procedure: ESOPHAGEAL BANDING;  Surgeon: Yetta Flock, MD;  Location: [facility];  Service: Gastroenterology;  Laterality: N/A;   ESOPHAGEAL BANDING N/A 02/10/2021   Procedure: ESOPHAGEAL BANDING;  Surgeon: Jerene Bears, MD;  Location: [facility];  Service: Gastroenterology;  Laterality: N/A;   ESOPHAGEAL BANDING N/A 04/21/2021   Procedure: ESOPHAGEAL BANDING;  Surgeon: Milus Banister, MD;  Location: [facility];  Service: Endoscopy;  Laterality: N/A;   ESOPHAGEAL BANDING N/A 07/16/2021   Procedure: ESOPHAGEAL BANDING;  Surgeon: Ladene Artist, MD;  Location: [facility];  Service: Gastroenterology;  Laterality: N/A;   ESOPHAGEAL  BANDING  01/18/2022   Procedure: ESOPHAGEAL BANDING;  Surgeon: Gatha Mayer, MD;  Location[facility] ENDOSCOPY;  Service: Gastroenterology;;   ESOPHAGOGASTRODUODENOSCOPY N/A 04/25/2020   Procedure: ESOPHAGOGASTRODUODENOSCOPY (EGD);  Surgeon: Carol Ada, MD;  Location: [facility];  Service: Endoscopy;  Laterality: N/A;   ESOPHAGOGASTRODUODENOSCOPY (EGD) WITH PROPOFOL N/A 04/17/2020   Procedure: ESOPHAGOGASTRODUODENOSCOPY (EGD) WITH PROPOFOL;  Surgeon: Yetta Flock, MD;  Location: [facility];  Service: Gastroenterology;  Laterality: N/A;   ESOPHAGOGASTRODUODENOSCOPY (EGD) WITH PROPOFOL N/A 04/29/2020   Procedure: ESOPHAGOGASTRODUODENOSCOPY (EGD) WITH PROPOFOL;  Surgeon: Rush Landmark Telford Nab., MD;  Location: [place];  Service: Gastroenterology;  Laterality: N/A;   ESOPHAGOGASTRODUODENOSCOPY (EGD) WITH PROPOFOL N/A 06/10/2020   Procedure: ESOPHAGOGASTRODUODENOSCOPY (EGD) WITH PROPOFOL;  Surgeon: Yetta Flock, MD;  Location: [facility];  Service: Gastroenterology;  Laterality: N/A;   ESOPHAGOGASTRODUODENOSCOPY (EGD) WITH PROPOFOL N/A 11/10/2020   Procedure: ESOPHAGOGASTRODUODENOSCOPY (EGD) WITH PROPOFOL;  Surgeon: Yetta Flock, MD;  Location: [facility];  Service: Gastroenterology;  Laterality: N/A;   ESOPHAGOGASTRODUODENOSCOPY (EGD) WITH PROPOFOL N/A 02/10/2021   Procedure: ESOPHAGOGASTRODUODENOSCOPY (EGD) WITH PROPOFOL;  Surgeon: Jerene Bears, MD;  Location: [facility];  Service: Gastroenterology;  Laterality: N/A;   ESOPHAGOGASTRODUODENOSCOPY (EGD) WITH PROPOFOL N/A 03/31/2021   Procedure: ESOPHAGOGASTRODUODENOSCOPY (EGD) WITH PROPOFOL;  Surgeon: Yetta Flock, MD;  [facility];  Service: Gastroenterology;  Laterality: N/A;   ESOPHAGOGASTRODUODENOSCOPY (EGD) WITH PROPOFOL N/A 04/21/2021   Procedure: ESOPHAGOGASTRODUODENOSCOPY (EGD) WITH PROPOFOL;  Surgeon: Milus Banister, MD;  Location: [facility];  Service: Endoscopy;  Laterality: N/A;    ESOPHAGOGASTRODUODENOSCOPY (EGD) WITH PROPOFOL N/A 07/16/2021   Procedure: ESOPHAGOGASTRODUODENOSCOPY (EGD) WITH PROPOFOL;  Surgeon: Ladene Artist, MD;  Location: [facility];  Service: Gastroenterology;  Laterality: N/A;   ESOPHAGOGASTRODUODENOSCOPY (EGD) WITH PROPOFOL N/A 10/15/2021   Procedure: ESOPHAGOGASTRODUODENOSCOPY (EGD) WITH PROPOFOL;  Surgeon: Yetta Flock, MD;  Location: [facility];  Service: Gastroenterology;  Laterality: N/A;   ESOPHAGOGASTRODUODENOSCOPY (EGD) WITH PROPOFOL N/A 01/18/2022   Procedure: ESOPHAGOGASTRODUODENOSCOPY (EGD) WITH PROPOFOL;  Surgeon: Gatha Mayer, MD;  Location: [place];  Service: Gastroenterology;  Laterality: N/A;   EYE SURGERY     HERNIA REPAIR     IR INTRAVASCULAR ULTRASOUND NON CORONARY  03/26/2022   IR PARACENTESIS  09/18/2020   IR RADIOLOGIST EVAL & MGMT  10/22/2021  IR THORACENTESIS ASP PLEURAL SPACE W/IMG GUIDE  10/05/2021   IR TIPS  03/26/2022   IR US GUIDE VASC ACCESS RIGHT  03/26/2022   KIDNEY STONE SURGERY     RADIOLOGY WITH ANESTHESIA N/A 03/26/2022   Procedure: TIPS;  Surgeon: Greggory Keen, MD;  Location: [place];  Service: Radiology;  Laterality: N/A;   WISDOM TOOTH EXTRACTION     Family History  Problem Relation Age of Onset   Liver cancer Mother    Colon cancer Neg Hx    Stomach cancer Neg Hx    Colon polyps Neg Hx    Esophageal cancer Neg Hx    Rectal cancer Neg Hx    Social History   Tobacco Use   Smoking status: Never    Passive exposure: Never   Smokeless tobacco: Never  Vaping Use   Vaping Use: Never used  Substance Use Topics   Alcohol use: Yes    Alcohol/week: 1.0 standard drink of alcohol    Types: 1 Cans of beer per week    Comment: rarely   Drug use: Never   Current Outpatient Medications  Medication Sig Dispense Refill   acetaminophen (TYLENOL) 325 MG tablet Take 325 mg by mouth every 6 (six) hours as needed for moderate pain.     albuterol (VENTOLIN HFA) 108 (90 Base) MCG/ACT inhaler  Inhale 1-2 puffs into the lungs every 6 (six) hours as needed for wheezing or shortness of breath.     ASMANEX HFA 100 MCG/ACT AERO Inhale 1 puff into the lungs daily.     carbidopa-levodopa (SINEMET IR) 25-100 MG tablet Take 1.5 tablets by mouth in the morning, at noon, and at bedtime.     CODEINE SULFATE PO Take 5 mLs by mouth at bedtime.     DULoxetine (CYMBALTA) 20 MG capsule Take 40 mg by mouth daily.     ferrous sulfate 325 (65 FE) MG EC tablet Take 325 mg by mouth 3 (three) times a week. MWF     fluticasone (FLONASE) 50 MCG/ACT nasal spray Place 1 spray into both nostrils daily as needed for allergies.     furosemide (LASIX) 20 MG tablet Take 20 mg by mouth daily.     guaiFENesin (DIABETIC TUSSIN EX) 100 MG/5ML liquid Take 10 mLs by mouth at bedtime.     insulin glargine (LANTUS) 100 unit/mL SOPN Inject 16 Units into the skin daily.     ipratropium (ATROVENT) 0.03 % nasal spray Place 1 spray into both nostrils at bedtime.     lactulose (CHRONULAC) 10 GM/15ML solution Take 45 mLs (30 g total) by mouth 3 (three) times daily. Hold this medication if you have had greater than three loose stools in one day. 946 mL 2   lactulose (CONSTULOSE) 10 GM/15ML solution Take 15 mLs (10 g total) by mouth 3 (three) times daily. Take 45 mls three times daily. 946 mL 2   latanoprost (XALATAN) 0.005 % ophthalmic solution Place 1 drop into both eyes at bedtime.     lidocaine (LIDODERM) 5 % Place 1 patch onto the skin daily as needed (pain). Remove & Discard patch within 12 hours or as directed by MD     loratadine (CLARITIN) 10 MG tablet Take 10 mg by mouth daily.     midodrine (PROAMATINE) 10 MG tablet Take 1 tablet (10 mg total) by mouth 3 (three) times daily.     montelukast (SINGULAIR) 10 MG tablet Take 10 mg by mouth daily.     Multiple Vitamin (MULTIVITAMIN  WITH MINERALS) TABS tablet Take 1 tablet by mouth daily.     pantoprazole (PROTONIX) 40 MG tablet Take 40 mg by mouth daily with breakfast.      Polyvinyl Alcohol-Povidone PF (REFRESH) 1.4-0.6 % SOLN Apply 1 drop to eye daily as needed (dry eyes).     rifaximin (XIFAXAN) 550 MG TABS tablet Take 1 tablet (550 mg total) by mouth 2 (two) times daily. 60 tablet 6   spironolactone (ALDACTONE) 25 MG tablet Take 1 tablet (25 mg total) by mouth daily. 30 tablet 1   tamsulosin (FLOMAX) 0.4 MG CAPS capsule Take 0.4 mg by mouth in the morning and at bedtime.     traZODone (DESYREL) 150 MG tablet Take 75 mg by mouth at bedtime as needed for sleep.     Zinc Oxide (TRIPLE PASTE) 12.8 % ointment Apply topically 2 (two) times daily. Apply to perianal region, folds of groin and both axilla 56.7 g 2   No current facility-administered medications for this visit.   Allergies  Allergen Reactions   Lisinopril Cough     Review of Systems: All systems reviewed and negative except where noted in HPI.   Lab Results  Component Value Date   WBC 2.7 (L) 04/10/2022   HGB 10.7 (L) 04/10/2022   HCT 32.6 (L) 04/10/2022   MCV 95.6 04/10/2022   PLT 87 (L) 04/10/2022    Lab Results  Component Value Date   CREATININE 1.20 05/05/2022   BUN 8 05/05/2022   NA 137 05/05/2022   K 3.7 05/05/2022   CL 100 05/05/2022   CO2 31 05/05/2022    Lab Results  Component Value Date   ALT 16 05/05/2022   AST 30 05/05/2022   ALKPHOS 143 (H) 05/05/2022   BILITOT 0.9 05/05/2022    Lab Results  Component Value Date   INR 1.4 (H) 04/04/2022   INR 1.3 (H) 04/02/2022   INR 1.2 04/01/2022     Physical Exam: BP 114/66   Pulse 89   Ht '5\' 6"'$  (1.676 m)   BMI 25.48 kg/m  Constitutional: Pleasant, male in no acute distress. Extremities: no edema Neurological: Alert and oriented to person place and time. Psychiatric: Normal mood and affect. Behavior is normal.   ASSESSMENT: 72 y.o. male here for assessment of the following  1. Cirrhosis of liver with ascites, unspecified hepatic cirrhosis type ([place])   2. Hepatic encephalopathy (HCC)   3. Esophageal varices  in cirrhosis (HCC)   4. Parkinson's disease, unspecified whether dyskinesia present, unspecified whether manifestations fluctuate   5. Personal history of fall    Complicated course with recurrent variceal bleeding despite numerous bandings, had hepatic hydrothorax.  For these issues underwent TIPS which has resolved his hepatic hydrothorax and his volume status is much better.  Hopefully his bleeding risk now is significantly improved.  Course has been complicated by worsening hepatic encephalopathy which led to admission after TIPS.  He has been on a stable dose of rifaximin and lactulose lately and doing better in that regard as well.  He definitely looks better to me than the last time I saw him.  Unfortunately his Parkinson's is worsening, he has been seen by neurology, dose adjustment there as well.  He has had some falls and recently hurt his shoulder.  That is the most active issue at this time for him.  We discussed a few things today.  He really does not have much edema, he has been on midodrine to help with his hypotension, I  would like to stop his diuretics as a trial to see if he tolerates this and if it helps his blood pressure, perhaps reduce his fall risk.  They agree to hold the Aldactone and Lasix, wife will keep a close eye on his extremities for edema and will contact me if things recur, he can resume low-dose if needed but worth a trial to hold it.  I will send him to the lab for CBC, c-Met, INR today for reassessment.  Make sure electrolytes and renal function okay.  He needs to follow-up with IR next month, will have TIPS ultrasound to make sure patent.  Otherwise continue lactulose and rifaximin.  Recommend he see orthopedics for his shoulder, discussed some options, they will go to their walk-in clinic.  I will see them in 2 months or sooner if any issues.  He agrees  PLAN: - lab for CBC, CMET, INR today - TIPS [place] in April (per IR) - stop aldactone and lasix for now - monitor  for edema, resume if needed - continue lactulose / rifaximin - increase protein in his diet - should see orthopedics for L shoulder pain post fall (xrays show no fracture), could be rotator cuff issue - f/u 2 months or sooner with issues  Jolly Mango, MD [facility]

## 2022-06-08 NOTE — Patient Instructions (Addendum)
Please go to the lab in the basement of our building to have lab work done as you leave today. Hit "B" for basement when you get on the elevator.  When the doors open the lab is on your left.  We will call you with the results. Thank you.   Hold aldactone and lasix for now.  Monitor for edema.  Continue lactulose and Xifaxan  You have been scheduled for a follow up appointment on Wed, 09-01-22 at 10:30am. Please arrive 10 minutes early for registration.   Thank you for entrusting me with your care and for choosing [facility], Dr. Langley Cellar    If your blood pressure at your visit was 140/90 or greater, please contact your primary care physician to follow up on this.  _______________________________________________________  If you are age 75 or older, your body mass index should be between 23-30. Your Body mass index is 25.48 kg/m. If this is out of the aforementioned range listed, please consider follow up with your Primary Care Provider.  If you are age 55 or younger, your body mass index should be between 19-25. Your Body mass index is 25.48 kg/m. If this is out of the aformentioned range listed, please consider follow up with your Primary Care Provider.   ________________________________________________________  The [place] [facility] providers would like to encourage you to use [facility] to communicate with providers for non-urgent requests or questions.  Due to long hold times on the telephone, sending your provider a message by [facility] may be a faster and more efficient way to get a response.  Please allow 48 business hours for a response.  Please remember that this is for non-urgent requests.  _______________________________________________________  Due to recent changes in healthcare laws, you may see the results of your imaging and laboratory studies on MyChart before your provider has had a chance to review them.  We understand that in some cases there may be results that  are confusing or concerning to you. Not all laboratory results come back in the same time frame and the provider may be waiting for multiple results in order to interpret others.  Please give [place] 48 hours in order for your provider to thoroughly review all the results before contacting the office for clarification of your results.

## 2022-06-23 ENCOUNTER — Other Ambulatory Visit: Payer: Self-pay | Admitting: Interventional Radiology

## 2022-06-23 DIAGNOSIS — K7581 Nonalcoholic steatohepatitis (NASH): Secondary | ICD-10-CM

## 2022-07-27 ENCOUNTER — Ambulatory Visit
Admission: RE | Admit: 2022-07-27 | Discharge: 2022-07-27 | Disposition: A | Discharge status: Home / Self Care | Payer: Non-veteran care | Source: Ambulatory Visit | Attending: Interventional Radiology | Admitting: Interventional Radiology

## 2022-07-27 ENCOUNTER — Ambulatory Visit
Admission: RE | Admit: 2022-07-27 | Discharge: 2022-07-27 | Disposition: A | Discharge status: Home / Self Care | Payer: Non-veteran care | Source: Ambulatory Visit | Attending: Radiology | Admitting: Radiology

## 2022-07-27 DIAGNOSIS — K7581 Nonalcoholic steatohepatitis (NASH): Secondary | ICD-10-CM

## 2022-07-27 NOTE — Progress Notes (Signed)
Patient ID: Corey Wood, male   DOB: 04-10-1950, 72 y.o.   MRN: [id number]       Chief Complaint:  Cryptogenic cirrhosis, portal hypertension, variceal bleeding  Referring Physician(s): Dr. Adela Lank  History of Present Illness: Corey Wood is a 72 y.o. male With known chronic cirrhosis/NASH.  He also had resultant portal hypertension from with esophageal varices, gastric varices and portal gastropathy.  He underwent successful TIPS procedure at the end of the year on 03/26/2022.  Since the TIPS, he has not had any additional variceal bleeding episodes, thoracentesis, or paracentesis.  Currently his hepatic encephalopathy is controlled with lactulose and Xifaxan.  Overall he is tolerating these medicines well with at least 2-3 bowel movements daily at home.  Recently he saw Dr. Adela Lank and his diuretic therapy was discontinued approximately 1 month ago.  No change in weight or fluid retention since this.  He continues to undergo at home physical and Occupational Therapy for deconditioning with some minimal improvement.  There have been less episodes of presyncopal spells, orthostatic symptoms, or syncope.  From a Parkinson's standpoint his wife reports continued slow progression.  Medications have been adjusted by neurology.  Today, he has no signs of hepatic encephalopathy mentally.  He is conversing well today with me.  Appropriate mood and affect.  Overall clinically he seems better today.  Ultrasound today confirms wide patency of the TIPS.  No signs of ascites.  Past Medical History:  Diagnosis Date   Allergy    seasonal allergies   Anxiety    on meds   Aortic valve disorder    Arthritis    generalized (fingers)(shoulders)   Asthma    uses inhaler   Cataract    bilateral -sx    Cirrhosis (HCC)    CKD (chronic kidney disease), stage III (HCC)    only has one kidney   COPD (chronic obstructive pulmonary disease) (HCC)    Depression    on meds   DM (diabetes  mellitus) (HCC)    Type II   Dyspnea    GERD (gastroesophageal reflux disease)    on meds   Heart murmur    History of colon polyps    History of COVID-19 10/14/2020   History of kidney stones    Hx of acute pancreatitis 10/2019   Hyperlipidemia    on meds   Hypertension    on meds   Hypothyroidism    not on meds at this time   Myelodysplastic syndrome (HCC)    Neuromuscular disorder (HCC)    per pt   Obstructive sleep apnea    Oxygen deficiency    Parkinsonism    per pt report   Peripheral edema    bilateral legs   Peripheral positional vertigo    Sleep apnea    No CPAP   Stroke [facility])     Past Surgical History:  Procedure Laterality Date   ANKLE SURGERY     CHOLECYSTECTOMY     ENDARTERECTOMY Left 04/29/2020   Procedure: CAROTID EXPLORATION removal of  central venous catheter;  Surgeon: Larina Earthly, MD;  Location: [facility] OR;  Service: Vascular;  Laterality: Left;   ESOPHAGEAL BANDING N/A 04/17/2020   Procedure: ESOPHAGEAL BANDING;  Surgeon: Benancio Deeds, MD;  Location: [facility] ENDOSCOPY;  Service: Gastroenterology;  Laterality: N/A;   ESOPHAGEAL BANDING  04/29/2020   Procedure: ESOPHAGEAL BANDING;  Surgeon: Meridee Score Netty Starring., MD;  Location: [facility] ENDOSCOPY;  Service: Gastroenterology;;   ESOPHAGEAL BANDING N/A 06/10/2020  Procedure: ESOPHAGEAL BANDING;  Surgeon: Benancio Deeds, MD;  Location: [facility] ENDOSCOPY;  Service: Gastroenterology;  Laterality: N/A;   ESOPHAGEAL BANDING N/A 11/10/2020   Procedure: ESOPHAGEAL BANDING;  Surgeon: Benancio Deeds, MD;  Location: [facility] ENDOSCOPY;  Service: Gastroenterology;  Laterality: N/A;   ESOPHAGEAL BANDING N/A 02/10/2021   Procedure: ESOPHAGEAL BANDING;  Surgeon: Beverley Fiedler, MD;  Location: [facility] ENDOSCOPY;  Service: Gastroenterology;  Laterality: N/A;   ESOPHAGEAL BANDING N/A 04/21/2021   Procedure: ESOPHAGEAL BANDING;  Surgeon: Rachael Fee, MD;  Location: [facility] ENDOSCOPY;  Service: Endoscopy;  Laterality: N/A;   ESOPHAGEAL  BANDING N/A 07/16/2021   Procedure: ESOPHAGEAL BANDING;  Surgeon: Meryl Dare, MD;  Location: [facility] ENDOSCOPY;  Service: Gastroenterology;  Laterality: N/A;   ESOPHAGEAL BANDING  01/18/2022   Procedure: ESOPHAGEAL BANDING;  Surgeon: Iva Boop, MD;  Location: [facility] ENDOSCOPY;  Service: Gastroenterology;;   ESOPHAGOGASTRODUODENOSCOPY N/A 04/25/2020   Procedure: ESOPHAGOGASTRODUODENOSCOPY (EGD);  Surgeon: Jeani Hawking, MD;  Location: [facility] ENDOSCOPY;  Service: Endoscopy;  Laterality: N/A;   ESOPHAGOGASTRODUODENOSCOPY (EGD) WITH PROPOFOL N/A 04/17/2020   Procedure: ESOPHAGOGASTRODUODENOSCOPY (EGD) WITH PROPOFOL;  Surgeon: Benancio Deeds, MD;  Location: [facility] ENDOSCOPY;  Service: Gastroenterology;  Laterality: N/A;   ESOPHAGOGASTRODUODENOSCOPY (EGD) WITH PROPOFOL N/A 04/29/2020   Procedure: ESOPHAGOGASTRODUODENOSCOPY (EGD) WITH PROPOFOL;  Surgeon: Meridee Score Netty Starring., MD;  Location: [facility] ENDOSCOPY;  Service: Gastroenterology;  Laterality: N/A;   ESOPHAGOGASTRODUODENOSCOPY (EGD) WITH PROPOFOL N/A 06/10/2020   Procedure: ESOPHAGOGASTRODUODENOSCOPY (EGD) WITH PROPOFOL;  Surgeon: Benancio Deeds, MD;  Location: [facility] ENDOSCOPY;  Service: Gastroenterology;  Laterality: N/A;   ESOPHAGOGASTRODUODENOSCOPY (EGD) WITH PROPOFOL N/A 11/10/2020   Procedure: ESOPHAGOGASTRODUODENOSCOPY (EGD) WITH PROPOFOL;  Surgeon: Benancio Deeds, MD;  Location: [facility] ENDOSCOPY;  Service: Gastroenterology;  Laterality: N/A;   ESOPHAGOGASTRODUODENOSCOPY (EGD) WITH PROPOFOL N/A 02/10/2021   Procedure: ESOPHAGOGASTRODUODENOSCOPY (EGD) WITH PROPOFOL;  Surgeon: Beverley Fiedler, MD;  Location: [facility] ENDOSCOPY;  Service: Gastroenterology;  Laterality: N/A;   ESOPHAGOGASTRODUODENOSCOPY (EGD) WITH PROPOFOL N/A 03/31/2021   Procedure: ESOPHAGOGASTRODUODENOSCOPY (EGD) WITH PROPOFOL;  Surgeon: Benancio Deeds, MD;  Location: [facility] ENDOSCOPY;  Service: Gastroenterology;  Laterality: N/A;   ESOPHAGOGASTRODUODENOSCOPY (EGD) WITH PROPOFOL N/A  04/21/2021   Procedure: ESOPHAGOGASTRODUODENOSCOPY (EGD) WITH PROPOFOL;  Surgeon: Rachael Fee, MD;  Location: [facility] ENDOSCOPY;  Service: Endoscopy;  Laterality: N/A;   ESOPHAGOGASTRODUODENOSCOPY (EGD) WITH PROPOFOL N/A 07/16/2021   Procedure: ESOPHAGOGASTRODUODENOSCOPY (EGD) WITH PROPOFOL;  Surgeon: Meryl Dare, MD;  Location: [facility] ENDOSCOPY;  Service: Gastroenterology;  Laterality: N/A;   ESOPHAGOGASTRODUODENOSCOPY (EGD) WITH PROPOFOL N/A 10/15/2021   Procedure: ESOPHAGOGASTRODUODENOSCOPY (EGD) WITH PROPOFOL;  Surgeon: Benancio Deeds, MD;  Location: [facility] ENDOSCOPY;  Service: Gastroenterology;  Laterality: N/A;   ESOPHAGOGASTRODUODENOSCOPY (EGD) WITH PROPOFOL N/A 01/18/2022   Procedure: ESOPHAGOGASTRODUODENOSCOPY (EGD) WITH PROPOFOL;  Surgeon: Iva Boop, MD;  Location: [facility] ENDOSCOPY;  Service: Gastroenterology;  Laterality: N/A;   EYE SURGERY     HERNIA REPAIR     IR INTRAVASCULAR ULTRASOUND NON CORONARY  03/26/2022   IR PARACENTESIS  09/18/2020   IR RADIOLOGIST EVAL & MGMT  10/22/2021   IR THORACENTESIS ASP PLEURAL SPACE W/IMG GUIDE  10/05/2021   IR TIPS  03/26/2022   IR US GUIDE VASC ACCESS RIGHT  03/26/2022   KIDNEY STONE SURGERY     RADIOLOGY WITH ANESTHESIA N/A 03/26/2022   Procedure: TIPS;  Surgeon: Berdine Dance, MD;  Location: [facility] OR;  Service: Radiology;  Laterality: N/A;   WISDOM TOOTH EXTRACTION      Allergies: Lisinopril  Medications: Prior to  Admission medications   Medication Sig Start Date End Date Taking? Authorizing Provider  acetaminophen (TYLENOL) 325 MG tablet Take 325 mg by mouth every 6 (six) hours as needed for moderate pain.    [provider]  albuterol (VENTOLIN HFA) 108 (90 Base) MCG/ACT inhaler Inhale 1-2 puffs into the lungs every 6 (six) hours as needed for wheezing or shortness of breath.    [provider]  [facility] HFA 100 MCG/ACT AERO Inhale 1 puff into the lungs daily.    [provider]  carbidopa-levodopa (SINEMET  IR) 25-100 MG tablet Take 1.5 tablets by mouth in the morning, at noon, and at bedtime. 06/30/21   [provider]  CODEINE SULFATE PO Take 5 mLs by mouth at bedtime.    [provider]  DULoxetine (CYMBALTA) 20 MG capsule Take 40 mg by mouth daily.    [provider]  ferrous sulfate 325 (65 FE) MG EC tablet Take 325 mg by mouth 3 (three) times a week. MWF    [provider]  fluticasone (FLONASE) 50 MCG/ACT nasal spray Place 1 spray into both nostrils daily as needed for allergies.    [provider]  furosemide (LASIX) 20 MG tablet Take 20 mg by mouth daily. Patient not taking: Reported on 06/08/2022    [provider]  guaiFENesin (DIABETIC TUSSIN EX) 100 MG/5ML liquid Take 10 mLs by mouth at bedtime.    [provider]  insulin glargine (LANTUS) 100 unit/mL SOPN Inject 16 Units into the skin daily.    [provider]  ipratropium (ATROVENT) 0.03 % nasal spray Place 1 spray into both nostrils at bedtime.    [provider]  lactulose (CHRONULAC) 10 GM/15ML solution Take 45 mLs (30 g total) by mouth 3 (three) times daily. Hold this medication if you have had greater than three loose stools in one day. 04/10/22   Shalhoub, Deno Lunger, MD  lactulose (CONSTULOSE) 10 GM/15ML solution Take 15 mLs (10 g total) by mouth 3 (three) times daily. Take 45 mls three times daily. 04/23/22   Armbruster, Willaim Rayas, MD  latanoprost (XALATAN) 0.005 % ophthalmic solution Place 1 drop into both eyes at bedtime. 08/20/20   [provider]  lidocaine (LIDODERM) 5 % Place 1 patch onto the skin daily as needed (pain). Remove & Discard patch within 12 hours or as directed by MD    [provider]  loratadine (CLARITIN) 10 MG tablet Take 10 mg by mouth daily. 09/04/20   [provider]  midodrine (PROAMATINE) 10 MG tablet Take 1 tablet (10 mg total) by mouth 3 (three) times daily. 10/21/21   Armbruster, Willaim Rayas, MD  montelukast  (SINGULAIR) 10 MG tablet Take 10 mg by mouth daily. 05/22/20   [provider]  Multiple Vitamin (MULTIVITAMIN WITH MINERALS) TABS tablet Take 1 tablet by mouth daily.    [provider]  pantoprazole (PROTONIX) 40 MG tablet Take 40 mg by mouth daily with breakfast.    [provider]  Polyvinyl Alcohol-Povidone PF (REFRESH) 1.4-0.6 % SOLN Apply 1 drop to eye daily as needed (dry eyes).    [provider]  rifaximin (XIFAXAN) 550 MG TABS tablet Take 1 tablet (550 mg total) by mouth 2 (two) times daily. 04/03/22   Mansouraty, Netty Starring., MD  spironolactone (ALDACTONE) 25 MG tablet Take 1 tablet (25 mg total) by mouth daily. Patient not taking: Reported on 06/08/2022 04/10/22   Marinda Elk, MD  tamsulosin (FLOMAX) 0.4 MG CAPS  capsule Take 0.4 mg by mouth in the morning and at bedtime.    [provider]  traZODone (DESYREL) 150 MG tablet Take 75 mg by mouth at bedtime as needed for sleep. 09/28/19   [provider]  Zinc Oxide (TRIPLE PASTE) 12.8 % ointment Apply topically 2 (two) times daily. Apply to perianal region, folds of groin and both axilla 04/10/22   Shalhoub, Deno Lunger, MD     Family History  Problem Relation Age of Onset   Liver cancer Mother    Colon cancer Neg Hx    Stomach cancer Neg Hx    Colon polyps Neg Hx    Esophageal cancer Neg Hx    Rectal cancer Neg Hx     Social History   Socioeconomic History   Marital status: Married    Spouse name: Not on file   Number of children: Not on file   Years of education: Not on file   Highest education level: Not on file  Occupational History   Occupation: retired  Tobacco Use   Smoking status: Never    Passive exposure: Never   Smokeless tobacco: Never  Vaping Use   Vaping Use: Never used  Substance and Sexual Activity   Alcohol use: Yes    Alcohol/week: 1.0 standard drink of alcohol    Types: 1 Cans of beer per week    Comment: rarely   Drug use: Never   Sexual  activity: Not Currently  Other Topics Concern   Not on file  Social History Narrative   Not on file   Social Determinants of Health   Financial Resource Strain: Not on file  Food Insecurity: No Food Insecurity (04/02/2022)   Hunger Vital Sign    Worried About Running Out of Food in the Last Year: Never true    Ran Out of Food in the Last Year: Never true  Transportation Needs: No Transportation Needs (04/02/2022)   PRAPARE - Administrator, Civil Service (Medical): No    Lack of Transportation (Non-Medical): No  Physical Activity: Not on file  Stress: Not on file  Social Connections: Not on file      Review of Systems: A 12 point ROS discussed and pertinent positives are indicated in the HPI above.  All other systems are negative.  Review of Systems  Vital Signs: BP 126/62 (Cuff Size: Normal)   Pulse (!) 55       Physical Exam Constitutional:      General: He is not in acute distress.    Appearance: He is not toxic-appearing.  Eyes:     General: No scleral icterus.    Conjunctiva/sclera: Conjunctivae normal.  Cardiovascular:     Rate and Rhythm: Normal rate and regular rhythm.     Heart sounds: Murmur heard.  Pulmonary:     Effort: Pulmonary effort is normal.     Breath sounds: Normal breath sounds.  Abdominal:     General: Bowel sounds are normal.     Palpations: Abdomen is soft.     Tenderness: There is no abdominal tenderness.  Musculoskeletal:        General: No swelling or deformity.  Skin:    General: Skin is warm and dry.     Coloration: Skin is not jaundiced.  Neurological:     Mental Status: He is alert. Mental status is at baseline.  Psychiatric:        Mood and Affect: Mood normal.  Behavior: Behavior normal.        Thought Content: Thought content normal.           Imaging: No results found.  Labs:  CBC: Recent Labs    04/08/22 0404 04/09/22 0334 04/10/22 0515 06/08/22 1701  WBC 3.8* 3.3* 2.7* 3.8*  HGB 10.0*  9.3* 10.7* 10.6*  HCT 31.3* 27.5* 32.6* 30.3*  PLT 76* 83* 87* 84.0*    COAGS: Recent Labs    01/17/22 1911 01/19/22 1623 04/01/22 1900 04/02/22 0343 04/04/22 1021 06/08/22 1701  INR 1.4*   < > 1.2 1.3* 1.4* 1.2*  APTT 33  --   --   --   --   --    < > = values in this interval not displayed.    BMP: Recent Labs    04/07/22 0343 04/08/22 0404 04/09/22 0334 04/10/22 0515 05/05/22 1201 06/08/22 1701  NA 133* 133* 130* 132* 137 134*  K 4.2 3.8 3.7 3.7 3.7 3.7  CL 104 105 102 103 100 98  CO2 22 22 21* 23 31 32  GLUCOSE 256* 148* 162* 145* 187* 171*  BUN 24* 19 14 12 8 12   CALCIUM 9.8 9.4 9.5 9.8 10.0 9.8  CREATININE 1.25* 1.00 0.95 0.92 1.20 1.28  GFRNONAA >60 >60 >60 >60  --   --     LIVER FUNCTION TESTS: Recent Labs    04/09/22 0334 04/10/22 0515 05/05/22 1201 06/08/22 1701  BILITOT 1.2 1.3* 0.9 0.8  AST 60* 63* 30 36  ALT 29 21 16 8   ALKPHOS 95 101 143* 132*  PROT 6.1* 6.8 7.8 7.2  ALBUMIN 2.2* 2.5* 2.9* 2.6*       Assessment and Plan:  35-month status post recent TIPS procedure performed 03/17/2022 at [facility] for cirrhosis, variceal bleeding, pleural effusions, and orthostatic hypotension.  Overall, he seems to be doing well today.  No signs of encephalopathy.  Stable baseline Parkinson's.  He is mentating well and engaging in conversation.  Appropriate mood and affect.  Diuretic therapy has been discontinued approximately 1 month ago.  No further episodes of orthostatic hypotension, syncope or recent falls.  He continues outpatient physical therapy and Occupational Therapy at home.  Plan: From a TIPS standpoint we plan to repeat ultrasound in 6 months and see him in the office at that time.    Electronically Signed: Berdine Dance 07/27/2022, 10:26 AM   I spent a total of    40 Minutes in face to face in clinical consultation, greater than 50% of which was counseling/coordinating care for This patient status post TIPS procedure.

## 2022-09-01 ENCOUNTER — Ambulatory Visit: Payer: No Typology Code available for payment source | Admitting: Gastroenterology

## 2022-09-01 ENCOUNTER — Ambulatory Visit (HOSPITAL_BASED_OUTPATIENT_CLINIC_OR_DEPARTMENT_OTHER)
Admission: RE | Admit: 2022-09-01 | Discharge: 2022-09-01 | Disposition: A | Discharge status: Home / Self Care | Payer: No Typology Code available for payment source | Source: Ambulatory Visit | Attending: Gastroenterology | Admitting: Gastroenterology

## 2022-09-01 ENCOUNTER — Encounter: Payer: Self-pay | Admitting: Gastroenterology

## 2022-09-01 ENCOUNTER — Other Ambulatory Visit (INDEPENDENT_AMBULATORY_CARE_PROVIDER_SITE_OTHER): Payer: No Typology Code available for payment source

## 2022-09-01 ENCOUNTER — Other Ambulatory Visit: Payer: Self-pay

## 2022-09-01 ENCOUNTER — Telehealth: Payer: Self-pay | Admitting: Gastroenterology

## 2022-09-01 VITALS — BP 138/70 | HR 87 | Ht 66.0 in

## 2022-09-01 DIAGNOSIS — K7682 Hepatic encephalopathy: Secondary | ICD-10-CM | POA: Diagnosis not present

## 2022-09-01 DIAGNOSIS — G20A1 Parkinson's disease without dyskinesia, without mention of fluctuations: Secondary | ICD-10-CM

## 2022-09-01 DIAGNOSIS — R188 Other ascites: Secondary | ICD-10-CM

## 2022-09-01 DIAGNOSIS — I8510 Secondary esophageal varices without bleeding: Secondary | ICD-10-CM

## 2022-09-01 DIAGNOSIS — K7460 Unspecified cirrhosis of liver: Secondary | ICD-10-CM

## 2022-09-01 DIAGNOSIS — M7989 Other specified soft tissue disorders: Secondary | ICD-10-CM

## 2022-09-01 LAB — CBC WITH DIFFERENTIAL/PLATELET
Basophils Absolute: 0 10*3/uL (ref 0.0–0.1)
Basophils Relative: 0.8 % (ref 0.0–3.0)
Eosinophils Absolute: 0.2 10*3/uL (ref 0.0–0.7)
Eosinophils Relative: 4.1 % (ref 0.0–5.0)
HCT: 36.4 % — ABNORMAL LOW (ref 39.0–52.0)
Hemoglobin: 12.4 g/dL — ABNORMAL LOW (ref 13.0–17.0)
Lymphocytes Relative: 12.1 % (ref 12.0–46.0)
Lymphs Abs: 0.6 10*3/uL — ABNORMAL LOW (ref 0.7–4.0)
MCHC: 34 g/dL (ref 30.0–36.0)
MCV: 99 fl (ref 78.0–100.0)
Monocytes Absolute: 0.3 10*3/uL (ref 0.1–1.0)
Monocytes Relative: 5.6 % (ref 3.0–12.0)
Neutro Abs: 3.7 10*3/uL (ref 1.4–7.7)
Neutrophils Relative %: 77.4 % — ABNORMAL HIGH (ref 43.0–77.0)
Platelets: 103 10*3/uL — ABNORMAL LOW (ref 150.0–400.0)
RBC: 3.67 Mil/uL — ABNORMAL LOW (ref 4.22–5.81)
RDW: 15.8 % — ABNORMAL HIGH (ref 11.5–15.5)
WBC: 4.8 10*3/uL (ref 4.0–10.5)

## 2022-09-01 LAB — COMPREHENSIVE METABOLIC PANEL
ALT: 5 U/L (ref 0–53)
AST: 31 U/L (ref 0–37)
Albumin: 3.2 g/dL — ABNORMAL LOW (ref 3.5–5.2)
Alkaline Phosphatase: 131 U/L — ABNORMAL HIGH (ref 39–117)
BUN: 13 mg/dL (ref 6–23)
CO2: 30 mEq/L (ref 19–32)
Calcium: 9.8 mg/dL (ref 8.4–10.5)
Chloride: 95 mEq/L — ABNORMAL LOW (ref 96–112)
Creatinine, Ser: 1.22 mg/dL (ref 0.40–1.50)
GFR: 58.55 mL/min — ABNORMAL LOW (ref >60.00)
Glucose, Bld: 144 mg/dL — ABNORMAL HIGH (ref 70–99)
Potassium: 3.9 mEq/L (ref 3.5–5.1)
Sodium: 136 mEq/L (ref 135–145)
Total Bilirubin: 1.2 mg/dL (ref 0.2–1.2)
Total Protein: 8.2 g/dL (ref 6.0–8.3)

## 2022-09-01 LAB — PROTIME-INR
INR: 1.2 ratio — ABNORMAL HIGH (ref 0.8–1.0)
Prothrombin Time: 12.8 s (ref 9.6–13.1)

## 2022-09-01 NOTE — Progress Notes (Signed)
HPI :  72 year old male with a history of cirrhosis here for follow up accompanied by his wife today.    See prior notes for details of his case. 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 Has been followed by hepatology with [facility] in the past, followed also by multiple subspecialists at the [facility] for other medical problems. Course complicated by GI bleeding from large esophageal varices and hepatic hydrothorax. Recall he has had extremely large varices / portal hypertensive gastritis - intolerant of beta-blockade due to bradycardia and hypotension, had elected for primary therapy of varices to be done with band ligation.  He has had a complicated course with post banding ulcer bleeding and refractory variceal bleeding > 40 bands placed over the past 2 years.  His course has also been complicated by hepatic hydrothorax. He has 1 kidney with some CKD, on low-dose diuretics, intolerant to higher dosing diuretics due to hypotension and recurrent falls in the setting of Parkinson's.  He has been on midodrine to help with his blood pressure and that has been helping him. .Eventually after much discussion and a recurrent GI bleed from varices, had TIPS done on December 29.   Recall his post TIPS course was complicated by worsening hepatic encephalopathy which led to admission.    He is here for routine follow-up today.  Wife states he is compliant with lactulose 3 times daily and use of rifaximin twice daily.  Initially this was giving him loose stools frequently however in fact has had formed stools and sometimes even constipated needing to use MiraLAX.  That being said his mental status appears appropriate he has not had any hospitalizations since have last seen him for encephalopathy.  He has generalized weakness.  His last labs were about 3 months ago.  Main symptom he complains of his right lower leg swelling.  Wife concurs this is unilateral, has been ongoing for a few weeks  now.  Denies any pain in it but it can swell significantly over the course of the day.  He does have some moderate swelling in the right extremity today.  He has not had any bleeding symptoms have been bothering him.  His abdominal exam appears stable.  He had an ultrasound to assess for TIPS patency in early May and that was normal with a patent TIPS.  He denies any chest pain or shortness of breath.  His wife states he had an MRI at the [place] this morning.  She is not sure what they imaged and why.  No records of that on file.  He has been followed by neurology at [facility], they have been adjusting his Parkinson's meds, they do think his Parkinson's is worsening in recent months.   Prior workup: EGD 01/30/20 - Esophagogastric landmarks identified. - Large esophageal varices as described. - Erythematous mucosa in the gastric fundus and antrum, suspect portal hypertensive gastritis. Biopsies taken to rule out H pylori. - Suspected small type 2 gastroesophageal varices (GOV2, esophageal varices which extend along the fundus). - Normal duodenal bulb and second portion of the duodenum, very mild superficial erythema Noted.   Colonoscopy 01/30/20 - The perianal and digital rectal examinations were normal. - The terminal ileum appeared normal. - A 5 to 6 mm polyp was found in the transverse colon. The polyp was flat. The polyp was removed with a cold snare. Resection and retrieval were complete. - Moderately congested mucosa was found in the entire colon, suspected due to portal hypertension. -  Internal hemorrhoids were found during retroflexion. - The exam was otherwise without abnormality.   1. Surgical [P], gastric antrum and gastric body - CHRONIC GASTRITIS. - WARTHIN-STARRY IS NEGATIVE FOR HELICOBACTER PYLORI. - NO INTESTINAL METAPLASIA, DYSPLASIA, OR MALIGNANCY. 2. Surgical [P], colon, transverse, polyp - TUBULAR ADENOMA. - NO HIGH GRADE DYSPLASIA OR MALIGNANCY.     EGD 04/17/20 -  Esophagogastric landmarks identified. - Large esophageal varices. Banded x 11. - Type 2 gastroesophageal varices (GOV2, esophageal varices which extend along the fundus). - Erythematous mucosa in the antrum. - Normal duodenal bulb and second portion of the duodenum.   EGD 04/25/20 - Large (> 5 mm) esophageal varices. - Esophageal ulcers with no stigmata of recent bleeding. - Portal hypertensive gastropathy. - A medium amount of food (residue) in the stomach. - Normal examined duodenum. - No specimens collected.   EGD 04/30/20 - No gross lesions in esophagus proximally. - Grade I, grade II and grade III esophageal varices as well as multiple post-banding ulcers with a few ulcers showing active oozing. Near completely eradicated after 6 bands were placed. No active extravasation noted thereafter. - Clotted blood in the entire stomach - suctioned and lavaged with mild clearance and did not see active re-accumulation of bleeding. - Type 2 gastroesophageal varices (GOV2, esophageal varices which extend along the fundus) - not completely visualized as had been at time of first endoscopy but no sing of active bleeding, but recent stigmata or nipple sign could have been missed due to blood in fundus. - Blood in the duodenal bulb and in the second portion of the duodenum - lavaged away.   Echo 05/01/20 - EF 60-65%   EGD 06/10/20 - Esophagogastric landmarks identified. - Large esophageal varices but improved overall in size / appearance compared to initial exam. Banded x 6 in the lower to mid esophagus. - Type 2 gastroesophageal varices (GOV2, esophageal varices which extend along the fundus). - Portal hypertensive gastropathy. - Normal duodenal bulb and second portion of the duodenum.   Patient had a CVA in April, follow up EGD was cancelled   Admitted to [facility] 5/21/08/30/20 for volume overload   EGD 11/10/20 - Esophagogastric landmarks identified. - Esophageal varices as described  above - improvement in size over time but columns still persist, mostly medium in size, one large column. Banded x 6. - Portal hypertensive gastropathy. - Normal duodenal bulb and second portion of the duodenum.     EGD 02/10/21 - Grade II and small (< 5 mm) esophageal varices with no bleeding and no stigmata of recent bleeding. Banded x 3 today with good treatment response. Overall excellent response to EVL with now small varices. - Portal hypertensive gastropathy. No gastric varices seen. - Normal examined duodenum.   RUQ [place] 09/23/20 - IMPRESSION: Cirrhotic liver morphology.  No focal hepatic lesion.     EGD 03/31/21 - retained food in the stomach   EGD 04/21/21 - Dr. Christella Hartigan Three trunks of Grade II varices were found in the distal esophagus with scar evidence of previous banding procedures. Six bands were successfully placed with complete eradication, resulting in deflation of varices. There was no bleeding during the procedure. Mild portal gastropathy changes throughout the stomach. No gastric varices   RUQ 02/27/21: IMPRESSION: 1. Cirrhotic morphology liver.  No sonographic evidence of hepatoma. 2. Common bile duct dilation, likely post cholecystectomy distention.     EGD 10/15/21: Esophagogastric landmarks identified. - Grade I esophageal varices with flattening - extensive scarring from prior banding, improved. No  further banding performed today - Portal hypertensive gastropathy. - Normal duodenal bulb and second portion of the duodenum.     EGD 07/16/21: One column of grade II varices with no bleeding and no stigmata of recent bleeding were found in the distal esophagus,. They were 6 mm in largest diameter. No red wale signs were present. Scarring from prior treatment was visible. The varices appeared smaller than they were at prior exam. Two bands were successfully placed with complete eradication, resulting in deflation of varices. There was no bleeding during and at  the end of the procedure.   The exam of the esophagus was otherwise normal. Moderate portal hypertensive gastropathy was found in the entire examined stomach. No gastric varices. The exam of the stomach was otherwise normal. The duodenal bulb and second portion of the duodenum were normal.      EGD 01/18/22: Grade II and large (> 5 mm) esophageal varices. Banded. x 4 some bleeding after banding of smaller varyx with nipple sign - it stopped. - Gastritis. - Portal hypertensive gastropathy. - The examination was otherwise normal. - No specimens collected.     Echocardiogram 01/19/22: EF 55-60%     US abdomen 01/19/22: IMPRESSION: 1. Stable findings of cirrhosis.  No focal liver abnormality. 2. Splenomegaly, compatible with portal venous hypertension. 3. Small amount of loculated ascites left upper quadrant. 4. Incidental small bilateral pleural effusions.     CT chest / abdomen / pelvis 04/01/22: ABDOMEN/PELVIS: 1. No acute abnormality in the abdomen or pelvis. 2. Interval TIPS creation, which appears patent. 3. Chronic left hydroureteronephrosis with atrophic parenchyma. Similar dependent 2 mm stone in the distal left ureter. 4. Unchanged splenomegaly. 5. Aortic Atherosclerosis (ICD10-I70.0).   05/05/22 - ammonia normal, CMET looked better   TIPS [place] 07/31/22: IMPRESSION: Widely patent TIPS shunt with no evidence to suggest stenosis or thrombus.   Past Medical History:  Diagnosis Date   Allergy    seasonal allergies   Anxiety    on meds   Aortic valve disorder    Arthritis    generalized (fingers)(shoulders)   Asthma    uses inhaler   Cataract    bilateral -sx    Cirrhosis (HCC)    CKD (chronic kidney disease), stage III (HCC)    only has one kidney   COPD (chronic obstructive pulmonary disease) (HCC)    Depression    on meds   DM (diabetes mellitus) (HCC)    Type II   Dyspnea    GERD (gastroesophageal reflux disease)    on meds   Heart murmur    History  of colon polyps    History of COVID-19 10/14/2020   History of kidney stones    Hx of acute pancreatitis 10/2019   Hyperlipidemia    on meds   Hypertension    on meds   Hypothyroidism    not on meds at this time   Myelodysplastic syndrome (HCC)    Neuromuscular disorder (HCC)    per pt   Obstructive sleep apnea    Oxygen deficiency    Parkinsonism    per pt report   Peripheral edema    bilateral legs   Peripheral positional vertigo    Sleep apnea    No CPAP   Stroke [facility])      Past Surgical History:  Procedure Laterality Date   ANKLE SURGERY     CHOLECYSTECTOMY     ENDARTERECTOMY Left 04/29/2020   Procedure: CAROTID EXPLORATION removal of  central venous  catheter;  Surgeon: Larina Earthly, MD;  Location: [facility] OR;  Service: Vascular;  Laterality: Left;   ESOPHAGEAL BANDING N/A 04/17/2020   Procedure: ESOPHAGEAL BANDING;  Surgeon: Benancio Deeds, MD;  Location: [facility] ENDOSCOPY;  Service: Gastroenterology;  Laterality: N/A;   ESOPHAGEAL BANDING  04/29/2020   Procedure: ESOPHAGEAL BANDING;  Surgeon: Meridee Score Netty Starring., MD;  Location: [facility] ENDOSCOPY;  Service: Gastroenterology;;   ESOPHAGEAL BANDING N/A 06/10/2020   Procedure: ESOPHAGEAL BANDING;  Surgeon: Benancio Deeds, MD;  Location: [facility] ENDOSCOPY;  Service: Gastroenterology;  Laterality: N/A;   ESOPHAGEAL BANDING N/A 11/10/2020   Procedure: ESOPHAGEAL BANDING;  Surgeon: Benancio Deeds, MD;  Location: [facility] ENDOSCOPY;  Service: Gastroenterology;  Laterality: N/A;   ESOPHAGEAL BANDING N/A 02/10/2021   Procedure: ESOPHAGEAL BANDING;  Surgeon: Beverley Fiedler, MD;  Location: [facility] ENDOSCOPY;  Service: Gastroenterology;  Laterality: N/A;   ESOPHAGEAL BANDING N/A 04/21/2021   Procedure: ESOPHAGEAL BANDING;  Surgeon: Rachael Fee, MD;  Location: [facility] ENDOSCOPY;  Service: Endoscopy;  Laterality: N/A;   ESOPHAGEAL BANDING N/A 07/16/2021   Procedure: ESOPHAGEAL BANDING;  Surgeon: Meryl Dare, MD;  Location: [facility] ENDOSCOPY;   Service: Gastroenterology;  Laterality: N/A;   ESOPHAGEAL BANDING  01/18/2022   Procedure: ESOPHAGEAL BANDING;  Surgeon: Iva Boop, MD;  Location: [facility] ENDOSCOPY;  Service: Gastroenterology;;   ESOPHAGOGASTRODUODENOSCOPY N/A 04/25/2020   Procedure: ESOPHAGOGASTRODUODENOSCOPY (EGD);  Surgeon: Jeani Hawking, MD;  Location: [facility] ENDOSCOPY;  Service: Endoscopy;  Laterality: N/A;   ESOPHAGOGASTRODUODENOSCOPY (EGD) WITH PROPOFOL N/A 04/17/2020   Procedure: ESOPHAGOGASTRODUODENOSCOPY (EGD) WITH PROPOFOL;  Surgeon: Benancio Deeds, MD;  Location: [facility] ENDOSCOPY;  Service: Gastroenterology;  Laterality: N/A;   ESOPHAGOGASTRODUODENOSCOPY (EGD) WITH PROPOFOL N/A 04/29/2020   Procedure: ESOPHAGOGASTRODUODENOSCOPY (EGD) WITH PROPOFOL;  Surgeon: Meridee Score Netty Starring., MD;  Location: [facility] ENDOSCOPY;  Service: Gastroenterology;  Laterality: N/A;   ESOPHAGOGASTRODUODENOSCOPY (EGD) WITH PROPOFOL N/A 06/10/2020   Procedure: ESOPHAGOGASTRODUODENOSCOPY (EGD) WITH PROPOFOL;  Surgeon: Benancio Deeds, MD;  Location: [facility] ENDOSCOPY;  Service: Gastroenterology;  Laterality: N/A;   ESOPHAGOGASTRODUODENOSCOPY (EGD) WITH PROPOFOL N/A 11/10/2020   Procedure: ESOPHAGOGASTRODUODENOSCOPY (EGD) WITH PROPOFOL;  Surgeon: Benancio Deeds, MD;  Location: [facility] ENDOSCOPY;  Service: Gastroenterology;  Laterality: N/A;   ESOPHAGOGASTRODUODENOSCOPY (EGD) WITH PROPOFOL N/A 02/10/2021   Procedure: ESOPHAGOGASTRODUODENOSCOPY (EGD) WITH PROPOFOL;  Surgeon: Beverley Fiedler, MD;  Location: [facility] ENDOSCOPY;  Service: Gastroenterology;  Laterality: N/A;   ESOPHAGOGASTRODUODENOSCOPY (EGD) WITH PROPOFOL N/A 03/31/2021   Procedure: ESOPHAGOGASTRODUODENOSCOPY (EGD) WITH PROPOFOL;  Surgeon: Benancio Deeds, MD;  Location: [facility] ENDOSCOPY;  Service: Gastroenterology;  Laterality: N/A;   ESOPHAGOGASTRODUODENOSCOPY (EGD) WITH PROPOFOL N/A 04/21/2021   Procedure: ESOPHAGOGASTRODUODENOSCOPY (EGD) WITH PROPOFOL;  Surgeon: Rachael Fee, MD;  Location: [facility]  ENDOSCOPY;  Service: Endoscopy;  Laterality: N/A;   ESOPHAGOGASTRODUODENOSCOPY (EGD) WITH PROPOFOL N/A 07/16/2021   Procedure: ESOPHAGOGASTRODUODENOSCOPY (EGD) WITH PROPOFOL;  Surgeon: Meryl Dare, MD;  Location: [facility] ENDOSCOPY;  Service: Gastroenterology;  Laterality: N/A;   ESOPHAGOGASTRODUODENOSCOPY (EGD) WITH PROPOFOL N/A 10/15/2021   Procedure: ESOPHAGOGASTRODUODENOSCOPY (EGD) WITH PROPOFOL;  Surgeon: Benancio Deeds, MD;  Location: [facility] ENDOSCOPY;  Service: Gastroenterology;  Laterality: N/A;   ESOPHAGOGASTRODUODENOSCOPY (EGD) WITH PROPOFOL N/A 01/18/2022   Procedure: ESOPHAGOGASTRODUODENOSCOPY (EGD) WITH PROPOFOL;  Surgeon: Iva Boop, MD;  Location: [facility] ENDOSCOPY;  Service: Gastroenterology;  Laterality: N/A;   EYE SURGERY     HERNIA REPAIR     IR INTRAVASCULAR ULTRASOUND NON CORONARY  03/26/2022   IR PARACENTESIS  09/18/2020   IR RADIOLOGIST EVAL & MGMT  10/22/2021  IR THORACENTESIS ASP PLEURAL SPACE W/IMG GUIDE  10/05/2021   IR TIPS  03/26/2022   IR US GUIDE VASC ACCESS RIGHT  03/26/2022   KIDNEY STONE SURGERY     RADIOLOGY WITH ANESTHESIA N/A 03/26/2022   Procedure: TIPS;  Surgeon: Berdine Dance, MD;  Location: [facility] OR;  Service: Radiology;  Laterality: N/A;   WISDOM TOOTH EXTRACTION     Family History  Problem Relation Age of Onset   Liver cancer Mother    Colon cancer Neg Hx    Stomach cancer Neg Hx    Colon polyps Neg Hx    Esophageal cancer Neg Hx    Rectal cancer Neg Hx    Social History   Tobacco Use   Smoking status: Never    Passive exposure: Never   Smokeless tobacco: Never  Vaping Use   Vaping Use: Never used  Substance Use Topics   Alcohol use: Yes    Alcohol/week: 1.0 standard drink of alcohol    Types: 1 Cans of beer per week    Comment: rarely   Drug use: Never   Current Outpatient Medications  Medication Sig Dispense Refill   acetaminophen (TYLENOL) 325 MG tablet Take 325 mg by mouth every 6 (six) hours as needed for moderate pain.      albuterol (VENTOLIN HFA) 108 (90 Base) MCG/ACT inhaler Inhale 1-2 puffs into the lungs every 6 (six) hours as needed for wheezing or shortness of breath.     ASMANEX HFA 100 MCG/ACT AERO Inhale 1 puff into the lungs daily.     carbidopa-levodopa (SINEMET IR) 25-100 MG tablet Take 1.5 tablets by mouth in the morning, at noon, and at bedtime.     CODEINE SULFATE PO Take 5 mLs by mouth at bedtime.     DULoxetine (CYMBALTA) 20 MG capsule Take 40 mg by mouth daily.     ferrous sulfate 325 (65 FE) MG EC tablet Take 325 mg by mouth 3 (three) times a week. MWF     fluticasone (FLONASE) 50 MCG/ACT nasal spray Place 1 spray into both nostrils daily as needed for allergies.     furosemide (LASIX) 20 MG tablet Take 20 mg by mouth daily.     guaiFENesin (DIABETIC TUSSIN EX) 100 MG/5ML liquid Take 10 mLs by mouth at bedtime.     insulin glargine (LANTUS) 100 unit/mL SOPN Inject 16 Units into the skin daily.     ipratropium (ATROVENT) 0.03 % nasal spray Place 1 spray into both nostrils at bedtime.     lactulose (CHRONULAC) 10 GM/15ML solution Take 45 mLs (30 g total) by mouth 3 (three) times daily. Hold this medication if you have had greater than three loose stools in one day. 946 mL 2   lactulose (CONSTULOSE) 10 GM/15ML solution Take 15 mLs (10 g total) by mouth 3 (three) times daily. Take 45 mls three times daily. 946 mL 2   latanoprost (XALATAN) 0.005 % ophthalmic solution Place 1 drop into both eyes at bedtime.     lidocaine (LIDODERM) 5 % Place 1 patch onto the skin daily as needed (pain). Remove & Discard patch within 12 hours or as directed by MD     loratadine (CLARITIN) 10 MG tablet Take 10 mg by mouth daily.     midodrine (PROAMATINE) 10 MG tablet Take 1 tablet (10 mg total) by mouth 3 (three) times daily.     montelukast (SINGULAIR) 10 MG tablet Take 10 mg by mouth daily.     Multiple Vitamin (MULTIVITAMIN  WITH MINERALS) TABS tablet Take 1 tablet by mouth daily.     pantoprazole (PROTONIX) 40 MG  tablet Take 40 mg by mouth daily with breakfast.     Polyvinyl Alcohol-Povidone PF (REFRESH) 1.4-0.6 % SOLN Apply 1 drop to eye daily as needed (dry eyes).     rifaximin (XIFAXAN) 550 MG TABS tablet Take 1 tablet (550 mg total) by mouth 2 (two) times daily. 60 tablet 6   tamsulosin (FLOMAX) 0.4 MG CAPS capsule Take 0.4 mg by mouth in the morning and at bedtime.     traZODone (DESYREL) 150 MG tablet Take 75 mg by mouth at bedtime as needed for sleep.     Zinc Oxide (TRIPLE PASTE) 12.8 % ointment Apply topically 2 (two) times daily. Apply to perianal region, folds of groin and both axilla 56.7 g 2   spironolactone (ALDACTONE) 25 MG tablet Take 1 tablet (25 mg total) by mouth daily. (Patient not taking: Reported on 09/01/2022) 30 tablet 1   No current facility-administered medications for this visit.   Allergies  Allergen Reactions   Lisinopril Cough     Review of Systems: All systems reviewed and negative except where noted in HPI.    Physical Exam: BP 138/70   Pulse 87   Ht 5\' 6"  (1.676 m)   BMI 25.48 kg/m  Constitutional: Pleasantmale in no acute distress. Abdominal: Soft, nontender. There are no masses palpable.  Extremities: (+) RLE edema compared to left Neurological: Alert and oriented to person place and time. Psychiatric: Normal mood and affect. Behavior is normal.   ASSESSMENT: 72 y.o. male here for assessment of the following  1. Cirrhosis of liver with ascites, unspecified hepatic cirrhosis type (HCC)   2. Hepatic encephalopathy (HCC)   3. Esophageal varices in cirrhosis (HCC)   4. Parkinson's disease, unspecified whether dyskinesia present, unspecified whether manifestations fluctuate   5. Leg swelling    Decompensated cirrhosis complicated by ascites and variceal bleeding status post TIPS.  Following TIPS he has had issues with hepatic encephalopathy, fortunately he has been managed with rifaximin and Aldactone and has not been an issue more recently from him.  He  has generally been frail and weak over the past several months, mostly due to worsening Parkinson's disease, his regimen has been adjusted for that, his course has been complicated by falls from his Parkinson's.  More acutely in recent weeks his right lower leg has become swollen compared to the left.  Will order a stat ultrasound to ensure no DVT there, hopefully can be done today or tomorrow.  He has no cardiopulmonary symptoms of bother him.  Vital stable. he is back on low-dose diuretics.  Will make sure his kidney function and electrolytes are stable as well as his blood counts.  Will send him to the lab today, will also check AFP for [facility] screening.  He needs HCC screening with imaging in July, we will plan on pursuing ultrasound for that unless he had imaging of his abdomen today at the [place].  His wife is not sure what type of MRI he had done and will let us know.  Otherwise, counseled him to titrate his lactulose to 3 loose stools per day at least.  He will increase his lactulose to 4 times daily and can take MiraLAX as well for constipation.  Continue rifaximin.  He will continue to follow closely with me every 3 months if not sooner with any issues.  PLAN: - lab for CBC, CMET, INR, AFP today -  likely needs RUQ [place] in July - had MRI at [facility] this morning, does not  what he had done. Wife will send [place] a message to clarify and determine if he still needs the [place] - increase lactulose to four times daily, titrate as needed - continue Rifaximin - RLE [place] to rule out DVT  - f/u 3 months  Harlin Rain, MD [facility]

## 2022-09-01 NOTE — Patient Instructions (Signed)
Please go to the lab in the basement of our building to have lab work done as you leave today. Hit "B" for basement when you get on the elevator.  When the doors open the lab is on your left.  We will call you with the results. Thank you.  You have been scheduled for an ultrasound of your right leg at [facility] today at 3:30pm. Please arrive at 3:00pm.  Increase lactulose to four times (4) a day. Then titrate as needed  You have been scheduled for a follow up appointment on Wednesday, 12-22-22 at 10:10 am. Please arrive 10 minutes early for registration. If you need to reschedule or cancel this appointment please call [phone] as soon as possible. Thank you.  You may be due for an liver ultrasound in July.  Please provide [place] a copy of the MRI you had done at the [place] so we can see if that covered the liver or not.  Thank you.  Our fax #: [phone].  Thank you for entrusting me with your care and for choosing [facility], Dr. Ileene Patrick   If your blood pressure at your visit was 140/90 or greater, please contact your primary care physician to follow up on this. ______________________________________________________  If you are age 59 or older, your body mass index should be between 23-30. Your Body mass index is 25.48 kg/m. If this is out of the aforementioned range listed, please consider follow up with your Primary Care Provider.  If you are age 50 or younger, your body mass index should be between 19-25. Your Body mass index is 25.48 kg/m. If this is out of the aformentioned range listed, please consider follow up with your Primary Care Provider.  ________________________________________________________  The  GI providers would like to encourage you to use [facility] to communicate with providers for non-urgent requests or questions.  Due to long hold times on the telephone, sending your provider a message by [facility] may be a faster and more efficient way to  get a response.  Please allow 48 business hours for a response.  Please remember that this is for non-urgent requests.  _______________________________________________________  Due to recent changes in healthcare laws, you may see the results of your imaging and laboratory studies on MyChart before your provider has had a chance to review them.  We understand that in some cases there may be results that are confusing or concerning to you. Not all laboratory results come back in the same time frame and the provider may be waiting for multiple results in order to interpret others.  Please give [place] 48 hours in order for your provider to thoroughly review all the results before contacting the office for clarification of your results.

## 2022-09-01 NOTE — Telephone Encounter (Signed)
Request faxed to [facility] for MRI.  Rec'd result: MRI of head dated 09-01-22. Gave to Dr. Adela Lank to review and scan to chart.

## 2022-09-01 NOTE — Telephone Encounter (Signed)
[facility] states [facility] needs [facility]ed over if we would like to retreive the information. Please advise. Patient did not get a fax number.

## 2022-09-02 LAB — AFP TUMOR MARKER: AFP-Tumor Marker: 2.2 ng/mL (ref <6.1)

## 2022-09-02 NOTE — Telephone Encounter (Signed)
Patient wife requesting a call back to discuss how patient should proceed after recent labs and imaging. Please advise, thank you.

## 2022-09-02 NOTE — Telephone Encounter (Signed)
Called and spoke to Corey Wood. Relayed results and recommendations of lower extremity U/S done yesterday.  She expressed understanding.  She requested the number for [facility] Scheduling to schedule his liver U/S in July.  She missed their call earlier. Number sent via MyChart

## 2022-09-09 DIAGNOSIS — I8510 Secondary esophageal varices without bleeding: Secondary | ICD-10-CM | POA: Diagnosis not present

## 2022-09-09 DIAGNOSIS — K7682 Hepatic encephalopathy: Secondary | ICD-10-CM | POA: Diagnosis not present

## 2022-09-09 DIAGNOSIS — K7460 Unspecified cirrhosis of liver: Secondary | ICD-10-CM | POA: Diagnosis not present

## 2022-09-09 DIAGNOSIS — K7581 Nonalcoholic steatohepatitis (NASH): Secondary | ICD-10-CM | POA: Diagnosis not present

## 2022-09-27 ENCOUNTER — Ambulatory Visit (HOSPITAL_COMMUNITY)
Admission: RE | Admit: 2022-09-27 | Discharge: 2022-09-27 | Disposition: A | Discharge status: Home / Self Care | Payer: No Typology Code available for payment source | Source: Ambulatory Visit | Attending: Gastroenterology | Admitting: Gastroenterology

## 2022-09-27 DIAGNOSIS — K7460 Unspecified cirrhosis of liver: Secondary | ICD-10-CM | POA: Diagnosis present

## 2022-09-27 DIAGNOSIS — K7682 Hepatic encephalopathy: Secondary | ICD-10-CM | POA: Insufficient documentation

## 2022-09-27 DIAGNOSIS — R188 Other ascites: Secondary | ICD-10-CM | POA: Insufficient documentation

## 2022-09-27 DIAGNOSIS — I8510 Secondary esophageal varices without bleeding: Secondary | ICD-10-CM | POA: Diagnosis present

## 2022-10-05 ENCOUNTER — Telehealth: Payer: Self-pay | Admitting: Gastroenterology

## 2022-10-05 NOTE — Telephone Encounter (Signed)
He needs to go to the ER for formal assessment.

## 2022-10-05 NOTE — Telephone Encounter (Signed)
Patients wife called with some concerns states the patient is very distressed and has liver issues. He has not been able to have any bowel movements for a few days now. Is very weak and just not himself. She also stated the [facility] provided [facility] some [facility] yesterday but has not helped. She is requesting a call back to discuss further, she is concerned his Ammonia levels.

## 2022-10-05 NOTE — Telephone Encounter (Signed)
Dr. Marina Goodell as DOD AM of 10/05/22 - please advise, thanks!  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  Patient's wife called in today with concerns. She reports that patient "is talking out of his head" this morning. He is very weak and can't walk. Could be progression of his Parkinson's. Pt has been having issues with passing stools. She has been giving pt 2 capfuls of Lactulose QID, but he is only really having formed stools. Pain management provider told pt's wife that she can give him Lactulose up to 6-7x a day if needed. Pt passed a formed stool this morning. Pt did vomit today while trying to eat, but he typically tolerates liquids and solids well. Since pt had been having formed stools, his wife has added 1 capful of Miralax for the past couple of days. Pt had PT this morning in the home and his oxygen levels were in the 80's, they had to give him oxygen. Wife is very concerned that ammonia levels may be elevated. Please advise, thanks.

## 2022-10-05 NOTE — Telephone Encounter (Signed)
Returned call to [place] regarding recommendations. [name] states that since we last spoke patient has passed 4 loose stools, and he is doing better. Patient is speaking in complete sentences. [name] thinks that the Lactulose and Miralax started working. I told her if patient is not having several bowel movements a day and becomes confused again he will need to go to ER for evaluation. [name] verbalized understanding and had no concerns at the end of the call.

## 2022-11-05 ENCOUNTER — Other Ambulatory Visit (HOSPITAL_COMMUNITY): Payer: Self-pay | Admitting: Interventional Radiology

## 2022-11-05 DIAGNOSIS — K766 Portal hypertension: Secondary | ICD-10-CM

## 2022-11-05 DIAGNOSIS — K7460 Unspecified cirrhosis of liver: Secondary | ICD-10-CM

## 2022-12-17 ENCOUNTER — Telehealth: Payer: Self-pay | Admitting: Gastroenterology

## 2022-12-17 NOTE — Telephone Encounter (Signed)
PT and wife are calling  concerned about fluid buildup and oxygen level of 83. Please advise

## 2022-12-17 NOTE — Telephone Encounter (Signed)
Called and spoke with patient's wife regarding recommendations below. Pt's wife reports that patient is now relaxed and sleeping,oxygen is at 94%. Pt's wife will continue to monitor his symptoms at this time. If pt has recurrent symptoms his wife will take him to the ER as previously advised. Pt's wife verbalized understanding of information and had no concerns at the end of the call.

## 2022-12-17 NOTE — Telephone Encounter (Signed)
Dr. 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 Currently managed on Aldactone 25 mg daily, Rifaxmin 550 mg BID, and Lactulose up to 4 times a day with a goal of 3 stools a day. Pt's wife called in to inform us that patient is on his regular order of 2 L of Oxygen and his O2 stats are 83%. Physical therapist came by today and increased oxygen to 3 L and O2 stats are still at 82% currently. Pt's wife keeps stating that patient "needs the fluid removed", she does not know if the fluid is built up in his chest or abdomen. Pt has had to have thoracentesis in the past. Pt's previous pulmonologist in [facility] is no longer practicing at that location. They are currently waiting to be scheduled with a new pulmonologist. Pt's wife is very concerned, she states that patient is just mumbling, half asleep, and "out of it". I advised that she take patient to the ER. Pt's wife really wants to avoid this if at all possible. I told her that patient needs an in person assessment to determine where the fluid is, hard to order thoracentesis or paracentesis. Pt's wife is aware Dr. Adela Lank is out of the office until Tuesday, pt has a f/u scheduled with him on 12/24/22. Any recommendations in the interim? Please advise, thanks.

## 2022-12-17 NOTE — Telephone Encounter (Signed)
ER visit is next step and appropriate to call 911

## 2022-12-21 NOTE — Telephone Encounter (Signed)
Inbound call from patients wife requesting a call from nurse to discuss if patient would need to be seen today instead of tomorrow. Wife stated that patients oxygen has been changed to 3 litters. Please advise.

## 2022-12-21 NOTE — Progress Notes (Unsigned)
HPI :  72 year old Wood with a history of cirrhosis here for follow up accompanied by his wife today.    See prior notes for details of his case. 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 Has been followed by hepatology with [facility] in the past, followed also by multiple subspecialists at the [facility] for other medical problems. Course complicated by GI bleeding from large esophageal varices and hepatic hydrothorax. Recall he has had extremely large varices / portal hypertensive gastritis - intolerant of beta-blockade due to bradycardia and hypotension, had elected for primary therapy of varices to be done with band ligation.  He has had a complicated course with post banding ulcer bleeding and refractory variceal bleeding > Corey bands placed over the past 2 years.  His course has also been complicated by hepatic hydrothorax. He has 1 kidney with some CKD, on low-dose diuretics, intolerant to higher dosing diuretics due to hypotension and recurrent falls in the setting of Parkinson's.  He has been on midodrine to help with his blood pressure and that has been helping him. .Eventually after much discussion and a recurrent GI bleed from varices, had TIPS done on December 29.   Recall his post TIPS course was complicated by worsening hepatic encephalopathy which led to admission.     He is here for routine follow-up today.  Wife states he is compliant with lactulose 3 times daily and use of rifaximin twice daily.  Initially this was giving him loose stools frequently however in fact has had formed stools and sometimes even constipated needing to use MiraLAX.  That being said his mental status appears appropriate he has not had any hospitalizations since have last seen him for encephalopathy.  He has generalized weakness.  His last labs were about 3 months ago.  Main symptom he complains of his right lower leg swelling.  Wife concurs this is unilateral, has been ongoing for a few  weeks now.  Denies any pain in it but it can swell significantly over the course of the day.  He does have some moderate swelling in the right extremity today.  He has not had any bleeding symptoms have been bothering him.  His abdominal exam appears stable.  He had an ultrasound to assess for TIPS patency in early May and that was normal with a patent TIPS.  He denies any chest pain or shortness of breath.   His wife states he had an MRI at the [place] this morning.  She is not sure what they imaged and why.  No records of that on file.   He has been followed by neurology at [facility], they have been adjusting his Parkinson's meds, they do think his Parkinson's is worsening in recent months.   Prior workup: EGD 01/30/20 - Esophagogastric landmarks identified. - Large esophageal varices as described. - Erythematous mucosa in the gastric fundus and antrum, suspect portal hypertensive gastritis. Biopsies taken to rule out H pylori. - Suspected small type 2 gastroesophageal varices (GOV2, esophageal varices which extend along the fundus). - Normal duodenal bulb and second portion of the duodenum, very mild superficial erythema Noted.   Colonoscopy 01/30/20 - The perianal and digital rectal examinations were normal. - The terminal ileum appeared normal. - A 5 to 6 mm polyp was found in the transverse colon. The polyp was flat. The polyp was removed with a cold snare. Resection and retrieval were complete. - Moderately congested mucosa was found in the entire colon, suspected due  to portal hypertension. - Internal hemorrhoids were found during retroflexion. - The exam was otherwise without abnormality.   1. Surgical [P], gastric antrum and gastric body - CHRONIC GASTRITIS. - WARTHIN-STARRY IS NEGATIVE FOR HELICOBACTER PYLORI. - NO INTESTINAL METAPLASIA, DYSPLASIA, OR MALIGNANCY. 2. Surgical [P], colon, transverse, polyp - TUBULAR ADENOMA. - NO HIGH GRADE DYSPLASIA OR MALIGNANCY.     EGD 04/17/20 -  Esophagogastric landmarks identified. - Large esophageal varices. Banded x 11. - Type 2 gastroesophageal varices (GOV2, esophageal varices which extend along the fundus). - Erythematous mucosa in the antrum. - Normal duodenal bulb and second portion of the duodenum.   EGD 04/25/20 - Large (> 5 mm) esophageal varices. - Esophageal ulcers with no stigmata of recent bleeding. - Portal hypertensive gastropathy. - A medium amount of food (residue) in the stomach. - Normal examined duodenum. - No specimens collected.   EGD 04/30/20 - No gross lesions in esophagus proximally. - Grade I, grade II and grade III esophageal varices as well as multiple post-banding ulcers with a few ulcers showing active oozing. Near completely eradicated after 6 bands were placed. No active extravasation noted thereafter. - Clotted blood in the entire stomach - suctioned and lavaged with mild clearance and did not see active re-accumulation of bleeding. - Type 2 gastroesophageal varices (GOV2, esophageal varices which extend along the fundus) - not completely visualized as had been at time of first endoscopy but no sing of active bleeding, but recent stigmata or nipple sign could have been missed due to blood in fundus. - Blood in the duodenal bulb and in the second portion of the duodenum - lavaged away.   Echo 05/01/20 - EF 60-65%   EGD 06/10/20 - Esophagogastric landmarks identified. - Large esophageal varices but improved overall in size / appearance compared to initial exam. Banded x 6 in the lower to mid esophagus. - Type 2 gastroesophageal varices (GOV2, esophageal varices which extend along the fundus). - Portal hypertensive gastropathy. - Normal duodenal bulb and second portion of the duodenum.   Patient had a CVA in April, follow up EGD was cancelled   Admitted to [facility] 5/21/08/30/20 for volume overload   EGD 11/10/20 - Esophagogastric landmarks identified. - Esophageal varices as described  above - improvement in size over time but columns still persist, mostly medium in size, one large column. Banded x 6. - Portal hypertensive gastropathy. - Normal duodenal bulb and second portion of the duodenum.     EGD 02/10/21 - Grade II and small (< 5 mm) esophageal varices with no bleeding and no stigmata of recent bleeding. Banded x 3 today with good treatment response. Overall excellent response to EVL with now small varices. - Portal hypertensive gastropathy. No gastric varices seen. - Normal examined duodenum.   RUQ [place] 09/23/20 - IMPRESSION: Cirrhotic liver morphology.  No focal hepatic lesion.     EGD 03/31/21 - retained food in the stomach   EGD 04/21/21 - Dr. Christella Hartigan Three trunks of Grade II varices were found in the distal esophagus with scar evidence of previous banding procedures. Six bands were successfully placed with complete eradication, resulting in deflation of varices. There was no bleeding during the procedure. Mild portal gastropathy changes throughout the stomach. No gastric varices   RUQ 02/27/21: IMPRESSION: 1. Cirrhotic morphology liver.  No sonographic evidence of hepatoma. 2. Common bile duct dilation, likely post cholecystectomy distention.     EGD 10/15/21: Esophagogastric landmarks identified. - Grade I esophageal varices with flattening - extensive scarring from  prior banding, improved. No further banding performed today - Portal hypertensive gastropathy. - Normal duodenal bulb and second portion of the duodenum.     EGD 07/16/21: One column of grade II varices with no bleeding and no stigmata of recent bleeding were found in the distal esophagus,. They were 6 mm in largest diameter. No red wale signs were present. Scarring from prior treatment was visible. The varices appeared smaller than they were at prior exam. Two bands were successfully placed with complete eradication, resulting in deflation of varices. There was no bleeding during and at  the end of the procedure.   The exam of the esophagus was otherwise normal. Moderate portal hypertensive gastropathy was found in the entire examined stomach. No gastric varices. The exam of the stomach was otherwise normal. The duodenal bulb and second portion of the duodenum were normal.      EGD 01/18/22: Grade II and large (> 5 mm) esophageal varices. Banded. x 4 some bleeding after banding of smaller varyx with nipple sign - it stopped. - Gastritis. - Portal hypertensive gastropathy. - The examination was otherwise normal. - No specimens collected.     Echocardiogram 01/19/22: EF 55-60%     US abdomen 01/19/22: IMPRESSION: 1. Stable findings of cirrhosis.  No focal liver abnormality. 2. Splenomegaly, compatible with portal venous hypertension. 3. Small amount of loculated ascites left upper quadrant. 4. Incidental small bilateral pleural effusions.     CT chest / abdomen / pelvis 04/01/22: ABDOMEN/PELVIS: 1. No acute abnormality in the abdomen or pelvis. 2. Interval TIPS creation, which appears patent. 3. Chronic left hydroureteronephrosis with atrophic parenchyma. Similar dependent 2 mm stone in the distal left ureter. 4. Unchanged splenomegaly. 5. Aortic Atherosclerosis (ICD10-I70.0).   05/05/22 - ammonia normal, CMET looked better   TIPS [place] 07/31/22: IMPRESSION: Widely patent TIPS shunt with no evidence to suggest stenosis or thrombus.         72 y.o. Wood here for assessment of the following   1. Cirrhosis of liver with ascites, unspecified hepatic cirrhosis type (HCC)   2. Hepatic encephalopathy (HCC)   3. Esophageal varices in cirrhosis (HCC)   4. Parkinson's disease, unspecified whether dyskinesia present, unspecified whether manifestations fluctuate   5. Leg swelling     Decompensated cirrhosis complicated by ascites and variceal bleeding status post TIPS.  Following TIPS he has had issues with hepatic encephalopathy, fortunately he has been managed  with rifaximin and Aldactone and has not been an issue more recently from him.  He has generally been frail and weak over the past several months, mostly due to worsening Parkinson's disease, his regimen has been adjusted for that, his course has been complicated by falls from his Parkinson's.   More acutely in recent weeks his right lower leg has become swollen compared to the left.  Will order a stat ultrasound to ensure no DVT there, hopefully can be done today or tomorrow.  He has no cardiopulmonary symptoms of bother him.  Vital stable. he is back on low-dose diuretics.  Will make sure his kidney function and electrolytes are stable as well as his blood counts.  Will send him to the lab today, will also check AFP for [facility] screening.  He needs HCC screening with imaging in July, we will plan on pursuing ultrasound for that unless he had imaging of his abdomen today at the [place].  His wife is not sure what type of MRI he had done and will let us know.   Otherwise,  counseled him to titrate his lactulose to 3 loose stools per day at least.  He will increase his lactulose to 4 times daily and can take MiraLAX as well for constipation.  Continue rifaximin.  He will continue to follow closely with me every 3 months if not sooner with any issues.   PLAN: - lab for CBC, CMET, INR, AFP today - likely needs RUQ [place] in July - had MRI at [facility] this morning, does not  what he had done. Wife will send [place] a message to clarify and determine if he still needs the [place] - increase lactulose to four times daily, titrate as needed - continue Rifaximin - RLE [place] to rule out DVT  - f/u 3 months    Hospitalizations since last visit? Hypoxia?    Past Medical History:  Diagnosis Date   Allergy    seasonal allergies   Anxiety    on meds   Aortic valve disorder    Arthritis    generalized (fingers)(shoulders)   Asthma    uses inhaler   Cataract    bilateral -sx    Cirrhosis (HCC)    CKD (chronic kidney disease), stage  III (HCC)    only has one kidney   COPD (chronic obstructive pulmonary disease) (HCC)    Depression    on meds   DM (diabetes mellitus) (HCC)    Type II   Dyspnea    GERD (gastroesophageal reflux disease)    on meds   Heart murmur    History of colon polyps    History of COVID-19 10/14/2020   History of kidney stones    Hx of acute pancreatitis 10/2019   Hyperlipidemia    on meds   Hypertension    on meds   Hypothyroidism    not on meds at this time   Myelodysplastic syndrome (HCC)    Neuromuscular disorder (HCC)    per pt   Obstructive sleep apnea    Oxygen deficiency    Parkinsonism    per pt report   Peripheral edema    bilateral legs   Peripheral positional vertigo    Sleep apnea    No CPAP   Stroke [facility])      Past Surgical History:  Procedure Laterality Date   ANKLE SURGERY     CHOLECYSTECTOMY     ENDARTERECTOMY Left 04/29/2020   Procedure: CAROTID EXPLORATION removal of  central venous catheter;  Surgeon: Larina Earthly, MD;  Location: [facility] OR;  Service: Vascular;  Laterality: Left;   ESOPHAGEAL BANDING N/A 04/17/2020   Procedure: ESOPHAGEAL BANDING;  Surgeon: Benancio Deeds, MD;  Location: [facility] ENDOSCOPY;  Service: Gastroenterology;  Laterality: N/A;   ESOPHAGEAL BANDING  04/29/2020   Procedure: ESOPHAGEAL BANDING;  Surgeon: Meridee Score Netty Starring., MD;  Location: [facility] ENDOSCOPY;  Service: Gastroenterology;;   ESOPHAGEAL BANDING N/A 06/10/2020   Procedure: ESOPHAGEAL BANDING;  Surgeon: Benancio Deeds, MD;  Location: [facility] ENDOSCOPY;  Service: Gastroenterology;  Laterality: N/A;   ESOPHAGEAL BANDING N/A 11/10/2020   Procedure: ESOPHAGEAL BANDING;  Surgeon: Benancio Deeds, MD;  Location: [facility] ENDOSCOPY;  Service: Gastroenterology;  Laterality: N/A;   ESOPHAGEAL BANDING N/A 02/10/2021   Procedure: ESOPHAGEAL BANDING;  Surgeon: Beverley Fiedler, MD;  Location: [facility] ENDOSCOPY;  Service: Gastroenterology;  Laterality: N/A;   ESOPHAGEAL BANDING N/A 04/21/2021    Procedure: ESOPHAGEAL BANDING;  Surgeon: Rachael Fee, MD;  Location: [facility] ENDOSCOPY;  Service: Endoscopy;  Laterality: N/A;   ESOPHAGEAL BANDING N/A 07/16/2021  Procedure: ESOPHAGEAL BANDING;  Surgeon: Meryl Dare, MD;  Location: [facility] ENDOSCOPY;  Service: Gastroenterology;  Laterality: N/A;   ESOPHAGEAL BANDING  01/18/2022   Procedure: ESOPHAGEAL BANDING;  Surgeon: Iva Boop, MD;  Location: [facility] ENDOSCOPY;  Service: Gastroenterology;;   ESOPHAGOGASTRODUODENOSCOPY N/A 04/25/2020   Procedure: ESOPHAGOGASTRODUODENOSCOPY (EGD);  Surgeon: Jeani Hawking, MD;  Location: [facility] ENDOSCOPY;  Service: Endoscopy;  Laterality: N/A;   ESOPHAGOGASTRODUODENOSCOPY (EGD) WITH PROPOFOL N/A 04/17/2020   Procedure: ESOPHAGOGASTRODUODENOSCOPY (EGD) WITH PROPOFOL;  Surgeon: Benancio Deeds, MD;  Location: [facility] ENDOSCOPY;  Service: Gastroenterology;  Laterality: N/A;   ESOPHAGOGASTRODUODENOSCOPY (EGD) WITH PROPOFOL N/A 04/29/2020   Procedure: ESOPHAGOGASTRODUODENOSCOPY (EGD) WITH PROPOFOL;  Surgeon: Meridee Score Netty Starring., MD;  Location: [facility] ENDOSCOPY;  Service: Gastroenterology;  Laterality: N/A;   ESOPHAGOGASTRODUODENOSCOPY (EGD) WITH PROPOFOL N/A 06/10/2020   Procedure: ESOPHAGOGASTRODUODENOSCOPY (EGD) WITH PROPOFOL;  Surgeon: Benancio Deeds, MD;  Location: [facility] ENDOSCOPY;  Service: Gastroenterology;  Laterality: N/A;   ESOPHAGOGASTRODUODENOSCOPY (EGD) WITH PROPOFOL N/A 11/10/2020   Procedure: ESOPHAGOGASTRODUODENOSCOPY (EGD) WITH PROPOFOL;  Surgeon: Benancio Deeds, MD;  Location: [facility] ENDOSCOPY;  Service: Gastroenterology;  Laterality: N/A;   ESOPHAGOGASTRODUODENOSCOPY (EGD) WITH PROPOFOL N/A 02/10/2021   Procedure: ESOPHAGOGASTRODUODENOSCOPY (EGD) WITH PROPOFOL;  Surgeon: Beverley Fiedler, MD;  Location: [facility] ENDOSCOPY;  Service: Gastroenterology;  Laterality: N/A;   ESOPHAGOGASTRODUODENOSCOPY (EGD) WITH PROPOFOL N/A 03/31/2021   Procedure: ESOPHAGOGASTRODUODENOSCOPY (EGD) WITH PROPOFOL;  Surgeon: Benancio Deeds, MD;  Location: [facility] ENDOSCOPY;  Service: Gastroenterology;  Laterality: N/A;   ESOPHAGOGASTRODUODENOSCOPY (EGD) WITH PROPOFOL N/A 04/21/2021   Procedure: ESOPHAGOGASTRODUODENOSCOPY (EGD) WITH PROPOFOL;  Surgeon: Rachael Fee, MD;  Location: [facility] ENDOSCOPY;  Service: Endoscopy;  Laterality: N/A;   ESOPHAGOGASTRODUODENOSCOPY (EGD) WITH PROPOFOL N/A 07/16/2021   Procedure: ESOPHAGOGASTRODUODENOSCOPY (EGD) WITH PROPOFOL;  Surgeon: Meryl Dare, MD;  Location: [facility] ENDOSCOPY;  Service: Gastroenterology;  Laterality: N/A;   ESOPHAGOGASTRODUODENOSCOPY (EGD) WITH PROPOFOL N/A 10/15/2021   Procedure: ESOPHAGOGASTRODUODENOSCOPY (EGD) WITH PROPOFOL;  Surgeon: Benancio Deeds, MD;  Location: [facility] ENDOSCOPY;  Service: Gastroenterology;  Laterality: N/A;   ESOPHAGOGASTRODUODENOSCOPY (EGD) WITH PROPOFOL N/A 01/18/2022   Procedure: ESOPHAGOGASTRODUODENOSCOPY (EGD) WITH PROPOFOL;  Surgeon: Iva Boop, MD;  Location: [facility] ENDOSCOPY;  Service: Gastroenterology;  Laterality: N/A;   EYE SURGERY     HERNIA REPAIR     IR INTRAVASCULAR ULTRASOUND NON CORONARY  03/26/2022   IR PARACENTESIS  09/18/2020   IR RADIOLOGIST EVAL & MGMT  10/22/2021   IR THORACENTESIS ASP PLEURAL SPACE W/IMG GUIDE  10/05/2021   IR TIPS  03/26/2022   IR US GUIDE VASC ACCESS RIGHT  03/26/2022   KIDNEY STONE SURGERY     RADIOLOGY WITH ANESTHESIA N/A 03/26/2022   Procedure: TIPS;  Surgeon: Berdine Dance, MD;  Location: [facility] OR;  Service: Radiology;  Laterality: N/A;   WISDOM TOOTH EXTRACTION     Family History  Problem Relation Age of Onset   Liver cancer Mother    Colon cancer Neg Hx    Stomach cancer Neg Hx    Colon polyps Neg Hx    Esophageal cancer Neg Hx    Rectal cancer Neg Hx    Social History   Tobacco Use   Smoking status: Never    Passive exposure: Never   Smokeless tobacco: Never  Vaping Use   Vaping status: Never Used  Substance Use Topics   Alcohol use: Yes    Alcohol/week: 1.0 standard drink of alcohol     Types: 1 Cans of beer per week    Comment: rarely  Drug use: Never   Current Outpatient Medications  Medication Sig Dispense Refill   acetaminophen (TYLENOL) 325 MG tablet Take 325 mg by mouth every 6 (six) hours as needed for moderate pain.     albuterol (VENTOLIN HFA) 108 (90 Base) MCG/ACT inhaler Inhale 1-2 puffs into the lungs every 6 (six) hours as needed for wheezing or shortness of breath.     ASMANEX HFA 100 MCG/ACT AERO Inhale 1 puff into the lungs daily.     carbidopa-levodopa (SINEMET IR) 25-100 MG tablet Take 1.5 tablets by mouth in the morning, at noon, and at bedtime.     CODEINE SULFATE PO Take 5 mLs by mouth at bedtime.     DULoxetine (CYMBALTA) 20 MG capsule Take Corey mg by mouth daily.     ferrous sulfate 325 (65 FE) MG EC tablet Take 325 mg by mouth 3 (three) times a week. MWF     fluticasone (FLONASE) 50 MCG/ACT nasal spray Place 1 spray into both nostrils daily as needed for allergies.     furosemide (LASIX) 20 MG tablet Take 20 mg by mouth daily.     guaiFENesin (DIABETIC TUSSIN EX) 100 MG/5ML liquid Take 10 mLs by mouth at bedtime.     insulin glargine (LANTUS) 100 unit/mL SOPN Inject 16 Units into the skin daily.     ipratropium (ATROVENT) 0.03 % nasal spray Place 1 spray into both nostrils at bedtime.     lactulose (CHRONULAC) 10 GM/15ML solution Take 45 mLs (30 g total) by mouth 3 (three) times daily. Hold this medication if you have had greater than three loose stools in one day. 946 mL 2   lactulose (CONSTULOSE) 10 GM/15ML solution Take 15 mLs (10 g total) by mouth 3 (three) times daily. Take 45 mls three times daily. 946 mL 2   latanoprost (XALATAN) 0.005 % ophthalmic solution Place 1 drop into both eyes at bedtime.     lidocaine (LIDODERM) 5 % Place 1 patch onto the skin daily as needed (pain). Remove & Discard patch within 12 hours or as directed by MD     loratadine (CLARITIN) 10 MG tablet Take 10 mg by mouth daily.     midodrine (PROAMATINE) 10 MG tablet  Take 1 tablet (10 mg total) by mouth 3 (three) times daily.     montelukast (SINGULAIR) 10 MG tablet Take 10 mg by mouth daily.     Multiple Vitamin (MULTIVITAMIN WITH MINERALS) TABS tablet Take 1 tablet by mouth daily.     pantoprazole (PROTONIX) Corey MG tablet Take Corey mg by mouth daily with breakfast.     Polyvinyl Alcohol-Povidone PF (REFRESH) 1.4-0.6 % SOLN Apply 1 drop to eye daily as needed (dry eyes).     rifaximin (XIFAXAN) 550 MG TABS tablet Take 1 tablet (550 mg total) by mouth 2 (two) times daily. 60 tablet 6   spironolactone (ALDACTONE) 25 MG tablet Take 1 tablet (25 mg total) by mouth daily. (Patient not taking: Reported on 09/01/2022) 30 tablet 1   tamsulosin (FLOMAX) 0.4 MG CAPS capsule Take 0.4 mg by mouth in the morning and at bedtime.     traZODone (DESYREL) 150 MG tablet Take 75 mg by mouth at bedtime as needed for sleep.     Zinc Oxide (TRIPLE PASTE) 12.8 % ointment Apply topically 2 (two) times daily. Apply to perianal region, folds of groin and both axilla 56.7 g 2   No current facility-administered medications for this visit.   Allergies  Allergen Reactions  Lisinopril Cough     Review of Systems: All systems reviewed and negative except where noted in HPI.    No results found.  Physical Exam: There were no vitals taken for this visit. Constitutional: Pleasant,well-developed, ***Wood in no acute distress. HEENT: Normocephalic and atraumatic. Conjunctivae are normal. No scleral icterus. Neck supple.  Cardiovascular: Normal rate, regular rhythm.  Pulmonary/chest: Effort normal and breath sounds normal. No wheezing, rales or rhonchi. Abdominal: Soft, nondistended, nontender. Bowel sounds active throughout. There are no masses palpable. No hepatomegaly. Extremities: no edema Lymphadenopathy: No cervical adenopathy noted. Neurological: Alert and oriented to person place and time. Skin: Skin is warm and dry. No rashes noted. Psychiatric: Normal mood and affect.  Behavior is normal.   ASSESSMENT: 72 y.o. Wood here for assessment of the following  No diagnosis found.  PLAN:   Clinic, [facility]

## 2022-12-21 NOTE — Telephone Encounter (Signed)
Called and left [name] a detailed vm letting her know that there is no way patient could be seen today since Dr. Adela Lank is doing procedures all day. [name] has been advised to keep pt's appt as scheduled for tomorrow.

## 2022-12-22 ENCOUNTER — Encounter: Payer: Self-pay | Admitting: Gastroenterology

## 2022-12-22 ENCOUNTER — Ambulatory Visit (INDEPENDENT_AMBULATORY_CARE_PROVIDER_SITE_OTHER): Payer: No Typology Code available for payment source | Admitting: Gastroenterology

## 2022-12-22 ENCOUNTER — Other Ambulatory Visit (INDEPENDENT_AMBULATORY_CARE_PROVIDER_SITE_OTHER): Payer: No Typology Code available for payment source

## 2022-12-22 ENCOUNTER — Ambulatory Visit (INDEPENDENT_AMBULATORY_CARE_PROVIDER_SITE_OTHER)
Admission: RE | Admit: 2022-12-22 | Discharge: 2022-12-22 | Disposition: A | Discharge status: Home / Self Care | Payer: No Typology Code available for payment source | Source: Ambulatory Visit | Attending: Gastroenterology | Admitting: Gastroenterology

## 2022-12-22 VITALS — BP 120/62 | HR 68 | Ht 66.0 in | Wt 167.0 lb

## 2022-12-22 DIAGNOSIS — K7460 Unspecified cirrhosis of liver: Secondary | ICD-10-CM

## 2022-12-22 DIAGNOSIS — K7682 Hepatic encephalopathy: Secondary | ICD-10-CM

## 2022-12-22 DIAGNOSIS — I8510 Secondary esophageal varices without bleeding: Secondary | ICD-10-CM | POA: Diagnosis not present

## 2022-12-22 DIAGNOSIS — R188 Other ascites: Secondary | ICD-10-CM

## 2022-12-22 DIAGNOSIS — J90 Pleural effusion, not elsewhere classified: Secondary | ICD-10-CM

## 2022-12-22 LAB — COMPREHENSIVE METABOLIC PANEL
ALT: 8 U/L (ref 0–53)
AST: 27 U/L (ref 0–37)
Albumin: 3.1 g/dL — ABNORMAL LOW (ref 3.5–5.2)
Alkaline Phosphatase: 125 U/L — ABNORMAL HIGH (ref 39–117)
BUN: 8 mg/dL (ref 6–23)
CO2: 38 mEq/L — ABNORMAL HIGH (ref 19–32)
Calcium: 10.1 mg/dL (ref 8.4–10.5)
Chloride: 93 mEq/L — ABNORMAL LOW (ref 96–112)
Creatinine, Ser: 1.38 mg/dL (ref 0.40–1.50)
GFR: 50.4 mL/min — ABNORMAL LOW (ref >60.00)
Glucose, Bld: 194 mg/dL — ABNORMAL HIGH (ref 70–99)
Potassium: 3.7 mEq/L (ref 3.5–5.1)
Sodium: 136 mEq/L (ref 135–145)
Total Bilirubin: 0.9 mg/dL (ref 0.2–1.2)
Total Protein: 7.5 g/dL (ref 6.0–8.3)

## 2022-12-22 LAB — CBC WITH DIFFERENTIAL/PLATELET
Basophils Absolute: 0 10*3/uL (ref 0.0–0.1)
Basophils Relative: 0.7 % (ref 0.0–3.0)
Eosinophils Absolute: 0.2 10*3/uL (ref 0.0–0.7)
Eosinophils Relative: 3 % (ref 0.0–5.0)
HCT: 36.9 % — ABNORMAL LOW (ref 39.0–52.0)
Hemoglobin: 12.5 g/dL — ABNORMAL LOW (ref 13.0–17.0)
Lymphocytes Relative: 8.8 % — ABNORMAL LOW (ref 12.0–46.0)
Lymphs Abs: 0.5 10*3/uL — ABNORMAL LOW (ref 0.7–4.0)
MCHC: 33.8 g/dL (ref 30.0–36.0)
MCV: 100 fl (ref 78.0–100.0)
Monocytes Absolute: 0.3 10*3/uL (ref 0.1–1.0)
Monocytes Relative: 5.4 % (ref 3.0–12.0)
Neutro Abs: 4.3 10*3/uL (ref 1.4–7.7)
Neutrophils Relative %: 82.1 % — ABNORMAL HIGH (ref 43.0–77.0)
Platelets: 84 10*3/uL — ABNORMAL LOW (ref 150.0–400.0)
RBC: 3.69 Mil/uL — ABNORMAL LOW (ref 4.22–5.81)
RDW: 16.2 % — ABNORMAL HIGH (ref 11.5–15.5)
WBC: 5.2 10*3/uL (ref 4.0–10.5)

## 2022-12-22 LAB — PROTIME-INR
INR: 1.2 ratio — ABNORMAL HIGH (ref 0.8–1.0)
Prothrombin Time: 12.6 s (ref 9.6–13.1)

## 2022-12-22 NOTE — Patient Instructions (Signed)
Please go to the lab in the basement of our building to have lab work done as you leave today. Hit "B" for basement when you get on the elevator.  When the doors open the lab is on your left.  We will call you with the results. Thank you.   Your provider has requested that you have an Chest x ray before leaving today. Please go to the basement floor to our Radiology department for the test.   Thank you for entrusting me with your care and for choosing [facility], Dr. Ileene Patrick     If your blood pressure at your visit was 140/90 or greater, please contact your primary care physician to follow up on this. ______________________________________________________  If you are age 71 or older, your body mass index should be between 23-30. Your Body mass index is 26.95 kg/m. If this is out of the aforementioned range listed, please consider follow up with your Primary Care Provider.  If you are age 39 or younger, your body mass index should be between 19-25. Your Body mass index is 26.95 kg/m. If this is out of the aformentioned range listed, please consider follow up with your Primary Care Provider.  ________________________________________________________  The [facility] providers would like to encourage you to use [facility] to communicate with providers for non-urgent requests or questions.  Due to long hold times on the telephone, sending your provider a message by [facility] may be a faster and more efficient way to get a response.  Please allow 48 business hours for a response.  Please remember that this is for non-urgent requests.  _______________________________________________________  Due to recent changes in healthcare laws, you may see the results of your imaging and laboratory studies on MyChart before your provider has had a chance to review them.  We understand that in some cases there may be results that are confusing or concerning to you. Not all laboratory results come  back in the same time frame and the provider may be waiting for multiple results in order to interpret others.  Please give [place] 48 hours in order for your provider to thoroughly review all the results before contacting the office for clarification of your results.

## 2022-12-24 ENCOUNTER — Encounter: Payer: Self-pay | Admitting: *Deleted

## 2022-12-24 ENCOUNTER — Other Ambulatory Visit: Payer: Self-pay | Admitting: *Deleted

## 2022-12-24 DIAGNOSIS — J90 Pleural effusion, not elsewhere classified: Secondary | ICD-10-CM

## 2022-12-27 ENCOUNTER — Other Ambulatory Visit (HOSPITAL_COMMUNITY): Payer: Self-pay | Admitting: Interventional Radiology

## 2022-12-27 ENCOUNTER — Ambulatory Visit (HOSPITAL_COMMUNITY)
Admission: RE | Admit: 2022-12-27 | Discharge: 2022-12-27 | Disposition: A | Discharge status: Home / Self Care | Payer: No Typology Code available for payment source | Source: Ambulatory Visit | Attending: Interventional Radiology

## 2022-12-27 ENCOUNTER — Ambulatory Visit (HOSPITAL_COMMUNITY)
Admission: RE | Admit: 2022-12-27 | Discharge: 2022-12-27 | Disposition: A | Discharge status: Home / Self Care | Payer: No Typology Code available for payment source | Source: Ambulatory Visit | Attending: Gastroenterology | Admitting: Gastroenterology

## 2022-12-27 DIAGNOSIS — J90 Pleural effusion, not elsewhere classified: Secondary | ICD-10-CM | POA: Insufficient documentation

## 2022-12-27 DIAGNOSIS — J948 Other specified pleural conditions: Secondary | ICD-10-CM | POA: Diagnosis not present

## 2022-12-27 HISTORY — PX: IR THORACENTESIS ASP PLEURAL SPACE W/IMG GUIDE: IMG5380

## 2022-12-27 LAB — BODY FLUID CELL COUNT WITH DIFFERENTIAL
Eos, Fluid: 12 %
Lymphs, Fluid: 56 %
Monocyte-Macrophage-Serous Fluid: 6 % — ABNORMAL LOW (ref 50–90)
Neutrophil Count, Fluid: 24 % (ref 0–25)
Total Nucleated Cell Count, Fluid: 110 mm3 (ref 0–1000)

## 2022-12-27 LAB — ALBUMIN, PLEURAL OR PERITONEAL FLUID: Albumin, Fluid: <1.5 g/dL

## 2022-12-27 LAB — PROTEIN, PLEURAL OR PERITONEAL FLUID: Total protein, fluid: <3 g/dL

## 2022-12-27 MED ORDER — LIDOCAINE HCL 1 % IJ SOLN
INTRAMUSCULAR | Status: AC
  Filled 2022-12-27: qty 20

## 2022-12-27 NOTE — Procedures (Signed)
PROCEDURE SUMMARY:  Successful US guided left diagnostic thoracentesis. Yielded 60mL of amber fluid. Pt tolerated procedure well. No immediate complications.  Specimen was sent for labs. CXR ordered.  EBL < 5 mL  Hoyt Koch PA-C 12/27/2022 9:10 AM

## 2022-12-29 LAB — CYTOLOGY - NON PAP

## 2023-01-04 ENCOUNTER — Ambulatory Visit
Admission: RE | Admit: 2023-01-04 | Discharge: 2023-01-04 | Disposition: A | Discharge status: Home / Self Care | Payer: No Typology Code available for payment source | Source: Ambulatory Visit | Attending: Gastroenterology | Admitting: Gastroenterology

## 2023-01-04 DIAGNOSIS — J90 Pleural effusion, not elsewhere classified: Secondary | ICD-10-CM

## 2023-01-12 DIAGNOSIS — J984 Other disorders of lung: Secondary | ICD-10-CM | POA: Diagnosis not present

## 2023-01-12 DIAGNOSIS — J918 Pleural effusion in other conditions classified elsewhere: Secondary | ICD-10-CM | POA: Diagnosis not present

## 2023-01-12 DIAGNOSIS — J90 Pleural effusion, not elsewhere classified: Secondary | ICD-10-CM | POA: Diagnosis not present

## 2023-01-12 DIAGNOSIS — R011 Cardiac murmur, unspecified: Secondary | ICD-10-CM | POA: Diagnosis not present

## 2023-01-12 DIAGNOSIS — J9611 Chronic respiratory failure with hypoxia: Secondary | ICD-10-CM | POA: Diagnosis not present

## 2023-01-12 DIAGNOSIS — K7469 Other cirrhosis of liver: Secondary | ICD-10-CM | POA: Diagnosis not present

## 2023-01-12 DIAGNOSIS — R918 Other nonspecific abnormal finding of lung field: Secondary | ICD-10-CM | POA: Diagnosis not present

## 2023-03-01 ENCOUNTER — Encounter (HOSPITAL_COMMUNITY): Payer: Self-pay

## 2023-03-01 ENCOUNTER — Inpatient Hospital Stay (HOSPITAL_COMMUNITY)
Admission: EM | Admit: 2023-03-01 | Discharge: 2023-03-05 | DRG: 381 | Disposition: A | Discharge status: Home Health | Payer: No Typology Code available for payment source | Attending: Internal Medicine | Admitting: Internal Medicine

## 2023-03-01 ENCOUNTER — Other Ambulatory Visit: Payer: Self-pay

## 2023-03-01 ENCOUNTER — Telehealth: Payer: No Typology Code available for payment source | Admitting: Physician Assistant

## 2023-03-01 ENCOUNTER — Emergency Department (HOSPITAL_COMMUNITY): Payer: No Typology Code available for payment source

## 2023-03-01 DIAGNOSIS — Z6826 Body mass index (BMI) 26.0-26.9, adult: Secondary | ICD-10-CM

## 2023-03-01 DIAGNOSIS — Z87442 Personal history of urinary calculi: Secondary | ICD-10-CM

## 2023-03-01 DIAGNOSIS — K2211 Ulcer of esophagus with bleeding: Secondary | ICD-10-CM | POA: Diagnosis not present

## 2023-03-01 DIAGNOSIS — D62 Acute posthemorrhagic anemia: Secondary | ICD-10-CM | POA: Diagnosis present

## 2023-03-01 DIAGNOSIS — E039 Hypothyroidism, unspecified: Secondary | ICD-10-CM | POA: Diagnosis present

## 2023-03-01 DIAGNOSIS — Z79899 Other long term (current) drug therapy: Secondary | ICD-10-CM

## 2023-03-01 DIAGNOSIS — Z85828 Personal history of other malignant neoplasm of skin: Secondary | ICD-10-CM

## 2023-03-01 DIAGNOSIS — D649 Anemia, unspecified: Secondary | ICD-10-CM | POA: Diagnosis present

## 2023-03-01 DIAGNOSIS — F32A Depression, unspecified: Secondary | ICD-10-CM | POA: Diagnosis present

## 2023-03-01 DIAGNOSIS — Z794 Long term (current) use of insulin: Secondary | ICD-10-CM

## 2023-03-01 DIAGNOSIS — N1831 Chronic kidney disease, stage 3a: Secondary | ICD-10-CM | POA: Diagnosis present

## 2023-03-01 DIAGNOSIS — I959 Hypotension, unspecified: Secondary | ICD-10-CM | POA: Diagnosis present

## 2023-03-01 DIAGNOSIS — E1122 Type 2 diabetes mellitus with diabetic chronic kidney disease: Secondary | ICD-10-CM | POA: Diagnosis present

## 2023-03-01 DIAGNOSIS — E785 Hyperlipidemia, unspecified: Secondary | ICD-10-CM | POA: Diagnosis present

## 2023-03-01 DIAGNOSIS — K7460 Unspecified cirrhosis of liver: Secondary | ICD-10-CM

## 2023-03-01 DIAGNOSIS — K922 Gastrointestinal hemorrhage, unspecified: Secondary | ICD-10-CM | POA: Diagnosis not present

## 2023-03-01 DIAGNOSIS — J9610 Chronic respiratory failure, unspecified whether with hypoxia or hypercapnia: Secondary | ICD-10-CM | POA: Diagnosis present

## 2023-03-01 DIAGNOSIS — K7682 Hepatic encephalopathy: Secondary | ICD-10-CM | POA: Diagnosis present

## 2023-03-01 DIAGNOSIS — Z95828 Presence of other vascular implants and grafts: Secondary | ICD-10-CM

## 2023-03-01 DIAGNOSIS — K209 Esophagitis, unspecified without bleeding: Secondary | ICD-10-CM

## 2023-03-01 DIAGNOSIS — R4182 Altered mental status, unspecified: Secondary | ICD-10-CM | POA: Diagnosis not present

## 2023-03-01 DIAGNOSIS — G20A1 Parkinson's disease without dyskinesia, without mention of fluctuations: Secondary | ICD-10-CM | POA: Diagnosis not present

## 2023-03-01 DIAGNOSIS — K7469 Other cirrhosis of liver: Secondary | ICD-10-CM | POA: Diagnosis present

## 2023-03-01 DIAGNOSIS — R0689 Other abnormalities of breathing: Secondary | ICD-10-CM | POA: Diagnosis not present

## 2023-03-01 DIAGNOSIS — Z800 Family history of malignant neoplasm of digestive organs: Secondary | ICD-10-CM

## 2023-03-01 DIAGNOSIS — F419 Anxiety disorder, unspecified: Secondary | ICD-10-CM | POA: Diagnosis present

## 2023-03-01 DIAGNOSIS — I129 Hypertensive chronic kidney disease with stage 1 through stage 4 chronic kidney disease, or unspecified chronic kidney disease: Secondary | ICD-10-CM | POA: Diagnosis present

## 2023-03-01 DIAGNOSIS — D61818 Other pancytopenia: Secondary | ICD-10-CM | POA: Diagnosis present

## 2023-03-01 DIAGNOSIS — Z8673 Personal history of transient ischemic attack (TIA), and cerebral infarction without residual deficits: Secondary | ICD-10-CM

## 2023-03-01 DIAGNOSIS — I8501 Esophageal varices with bleeding: Secondary | ICD-10-CM | POA: Diagnosis present

## 2023-03-01 DIAGNOSIS — Z8616 Personal history of COVID-19: Secondary | ICD-10-CM

## 2023-03-01 DIAGNOSIS — E663 Overweight: Secondary | ICD-10-CM | POA: Diagnosis present

## 2023-03-01 DIAGNOSIS — K5900 Constipation, unspecified: Secondary | ICD-10-CM | POA: Diagnosis not present

## 2023-03-01 DIAGNOSIS — K766 Portal hypertension: Secondary | ICD-10-CM | POA: Diagnosis present

## 2023-03-01 DIAGNOSIS — Z515 Encounter for palliative care: Secondary | ICD-10-CM

## 2023-03-01 DIAGNOSIS — G4733 Obstructive sleep apnea (adult) (pediatric): Secondary | ICD-10-CM | POA: Diagnosis present

## 2023-03-01 DIAGNOSIS — Z66 Do not resuscitate: Secondary | ICD-10-CM | POA: Diagnosis not present

## 2023-03-01 DIAGNOSIS — I4891 Unspecified atrial fibrillation: Secondary | ICD-10-CM | POA: Diagnosis not present

## 2023-03-01 DIAGNOSIS — Z860100 Personal history of colon polyps, unspecified: Secondary | ICD-10-CM

## 2023-03-01 DIAGNOSIS — K264 Chronic or unspecified duodenal ulcer with hemorrhage: Secondary | ICD-10-CM | POA: Diagnosis present

## 2023-03-01 DIAGNOSIS — J449 Chronic obstructive pulmonary disease, unspecified: Secondary | ICD-10-CM | POA: Diagnosis present

## 2023-03-01 DIAGNOSIS — J4489 Other specified chronic obstructive pulmonary disease: Secondary | ICD-10-CM | POA: Diagnosis present

## 2023-03-01 DIAGNOSIS — E871 Hypo-osmolality and hyponatremia: Secondary | ICD-10-CM | POA: Diagnosis present

## 2023-03-01 DIAGNOSIS — K7581 Nonalcoholic steatohepatitis (NASH): Secondary | ICD-10-CM | POA: Diagnosis present

## 2023-03-01 DIAGNOSIS — R296 Repeated falls: Secondary | ICD-10-CM | POA: Diagnosis present

## 2023-03-01 DIAGNOSIS — Z9981 Dependence on supplemental oxygen: Secondary | ICD-10-CM

## 2023-03-01 DIAGNOSIS — R000 Tachycardia, unspecified: Secondary | ICD-10-CM | POA: Diagnosis not present

## 2023-03-01 DIAGNOSIS — K921 Melena: Secondary | ICD-10-CM

## 2023-03-01 DIAGNOSIS — R0902 Hypoxemia: Secondary | ICD-10-CM | POA: Diagnosis not present

## 2023-03-01 LAB — URINALYSIS, ROUTINE W REFLEX MICROSCOPIC
Bacteria, UA: NONE SEEN
Bilirubin Urine: NEGATIVE
Glucose, UA: NEGATIVE mg/dL
Hgb urine dipstick: NEGATIVE
Ketones, ur: NEGATIVE mg/dL
Leukocytes,Ua: NEGATIVE
Nitrite: NEGATIVE
Protein, ur: NEGATIVE mg/dL
Specific Gravity, Urine: 1.026 (ref 1.005–1.030)
pH: 5 (ref 5.0–8.0)

## 2023-03-01 LAB — TYPE AND SCREEN
ABO/RH(D): O POS
Antibody Screen: NEGATIVE

## 2023-03-01 LAB — LIPASE, BLOOD: Lipase: 25 U/L (ref 11–51)

## 2023-03-01 LAB — COMPREHENSIVE METABOLIC PANEL
ALT: 12 U/L (ref 0–44)
AST: 56 U/L — ABNORMAL HIGH (ref 15–41)
Albumin: 2.5 g/dL — ABNORMAL LOW (ref 3.5–5.0)
Alkaline Phosphatase: 94 U/L (ref 38–126)
Anion gap: 11 (ref 5–15)
BUN: 38 mg/dL — ABNORMAL HIGH (ref 8–23)
CO2: 26 mmol/L (ref 22–32)
Calcium: 10.2 mg/dL (ref 8.9–10.3)
Chloride: 92 mmol/L — ABNORMAL LOW (ref 98–111)
Creatinine, Ser: 1.39 mg/dL — ABNORMAL HIGH (ref 0.61–1.24)
GFR, Estimated: 53 mL/min — ABNORMAL LOW (ref >60)
Glucose, Bld: 190 mg/dL — ABNORMAL HIGH (ref 70–99)
Potassium: 4.3 mmol/L (ref 3.5–5.1)
Sodium: 129 mmol/L — ABNORMAL LOW (ref 135–145)
Total Bilirubin: 1.1 mg/dL (ref <1.2)
Total Protein: 6.2 g/dL — ABNORMAL LOW (ref 6.5–8.1)

## 2023-03-01 LAB — CBC
HCT: 29.6 % — ABNORMAL LOW (ref 39.0–52.0)
Hemoglobin: 10.2 g/dL — ABNORMAL LOW (ref 13.0–17.0)
MCH: 35.1 pg — ABNORMAL HIGH (ref 26.0–34.0)
MCHC: 34.5 g/dL (ref 30.0–36.0)
MCV: 101.7 fL — ABNORMAL HIGH (ref 80.0–100.0)
Platelets: 92 10*3/uL — ABNORMAL LOW (ref 150–400)
RBC: 2.91 MIL/uL — ABNORMAL LOW (ref 4.22–5.81)
RDW: 14.7 % (ref 11.5–15.5)
WBC: 5.3 10*3/uL (ref 4.0–10.5)
nRBC: 0 % (ref 0.0–0.2)

## 2023-03-01 LAB — AMMONIA: Ammonia: 67 umol/L — ABNORMAL HIGH (ref 9–35)

## 2023-03-01 LAB — PROTIME-INR
INR: 1.3 — ABNORMAL HIGH (ref 0.8–1.2)
Prothrombin Time: 16 s — ABNORMAL HIGH (ref 11.4–15.2)

## 2023-03-01 LAB — POC OCCULT BLOOD, ED: Fecal Occult Bld: POSITIVE — AB

## 2023-03-01 MED ORDER — LORAZEPAM 2 MG/ML IJ SOLN
1.0000 mg | Freq: Once | INTRAMUSCULAR | Status: AC
  Administered 2023-03-01: 1 mg via INTRAVENOUS
  Filled 2023-03-01: qty 1

## 2023-03-01 MED ORDER — IOHEXOL 350 MG/ML SOLN
75.0000 mL | Freq: Once | INTRAVENOUS | Status: AC | PRN
  Administered 2023-03-01: 75 mL via INTRAVENOUS

## 2023-03-01 MED ORDER — OCTREOTIDE LOAD VIA INFUSION
50.0000 ug | Freq: Once | INTRAVENOUS | Status: AC
  Administered 2023-03-01: 50 ug via INTRAVENOUS
  Filled 2023-03-01: qty 25

## 2023-03-01 MED ORDER — PANTOPRAZOLE SODIUM 40 MG IV SOLR
40.0000 mg | INTRAVENOUS | Status: AC
  Filled 2023-03-01: qty 10

## 2023-03-01 MED ORDER — PANTOPRAZOLE SODIUM 40 MG IV SOLR
40.0000 mg | Freq: Two times a day (BID) | INTRAVENOUS | Status: DC
  Administered 2023-03-02 – 2023-03-05 (×6): 40 mg via INTRAVENOUS
  Filled 2023-03-01 (×7): qty 10

## 2023-03-01 MED ORDER — SODIUM CHLORIDE 0.9 % IV SOLN
50.0000 ug/h | INTRAVENOUS | Status: DC
  Administered 2023-03-01 – 2023-03-02 (×3): 50 ug/h via INTRAVENOUS
  Filled 2023-03-01 (×3): qty 1

## 2023-03-01 NOTE — Progress Notes (Signed)
Patient scheduled at 315 for video visit with noted appointment info of blood in stool, confusion and weakness.  This provider contacted patient and his wife 1:00 after seeing this appointment being scheduled, to further assess in triage patient to appropriate level of care.  Patient's wife endorses that patient has been having increasing confusion over the past 3 or 4 days coupled with inability to have a bowel movement despite 4 times daily lactulose.  Was finally able to have a small bowel movement this morning and both wife and home health nurse noted melena.  Patient with history of chronic kidney disease, Parkinson's disease, cirrhosis of liver with history of hepatic encephalopathy.  Notes blood pressure over the past few days has been running normal, but today is lower at 96/72.  Heart rate up to 97 from his baseline in the mid 80s.  Oxygen currently at 98% on room air.  Discussed with patient's wife the need for ER evaluation ASAP.  Home health nurse is going to contact EMS for transport.  Will send FYI to patient's gastroenterologist, Dr. Adela Lank.

## 2023-03-01 NOTE — ED Triage Notes (Signed)
Pt family reports pt has been having increasing confusion. Family reports that pt has been swallowing a lot and "acting like he has a sore throat". Pt recently fallen at home.

## 2023-03-01 NOTE — ED Provider Notes (Signed)
[facility] EMERGENCY DEPARTMENT AT [facility] Provider Note   CSN: [id number] Arrival date & time: 03/01/23  1422     History  Chief Complaint  Patient presents with   Altered Mental Status    Corey Wood is a 72 y.o. male.  The history is provided by the patient and the spouse.  Altered Mental Status Presenting symptoms: confusion   Severity:  Mild Most recent episode:  Yesterday Episode history:  Single Timing:  Constant Progression:  Unchanged Chronicity:  Recurrent Context comment:  Cirrhosis, dark stools today, increased confusion despite lactulose, recent fall Associated symptoms: no abdominal pain, no bladder incontinence, no decreased appetite, no depression, no difficulty breathing, no eye deviation, no fever, no hallucinations, no headaches, no light-headedness, no nausea, no palpitations, no rash, no seizures, no slurred speech, no suicidal behavior, no visual change, no vomiting and no weakness        Home Medications Prior to Admission medications   Medication Sig Start Date End Date Taking? Authorizing Provider  acetaminophen (TYLENOL) 325 MG tablet Take 325 mg by mouth every 6 (six) hours as needed for moderate pain.    [provider]  albuterol (VENTOLIN HFA) 108 (90 Base) MCG/ACT inhaler Inhale 1-2 puffs into the lungs every 6 (six) hours as needed for wheezing or shortness of breath.    [provider]  [facility] HFA 100 MCG/ACT AERO Inhale 1 puff into the lungs daily.    [provider]  carbidopa-levodopa (SINEMET IR) 25-100 MG tablet Take 1.5 tablets by mouth in the morning, at noon, and at bedtime. 06/30/21   [provider]  CODEINE SULFATE PO Take 5 mLs by mouth at bedtime.    [provider]  DULoxetine (CYMBALTA) 20 MG capsule Take 40 mg by mouth daily.    [provider]  ferrous sulfate 325 (65 FE) MG EC tablet Take 325 mg by mouth 3 (three) times a week. MWF    [provider]  fluticasone (FLONASE) 50 MCG/ACT nasal spray Place 1 spray into both nostrils daily as needed for allergies.    [provider]  furosemide (LASIX) 20 MG tablet Take 20 mg by mouth daily.    [provider]  guaiFENesin (DIABETIC TUSSIN EX) 100 MG/5ML liquid Take 10 mLs by mouth at bedtime.    [provider]  insulin glargine (LANTUS) 100 unit/mL SOPN Inject 16 Units into the skin daily.    [provider]  ipratropium (ATROVENT) 0.03 % nasal spray Place 1 spray into both nostrils at bedtime.    [provider]  lactulose (CHRONULAC) 10 GM/15ML solution Take 45 mLs (30 g total) by mouth 3 (three) times daily. Hold this medication if you have had greater than three loose stools in one day. 04/10/22   Shalhoub, Deno Lunger, MD  lactulose (CONSTULOSE) 10 GM/15ML solution Take 15 mLs (10 g total) by mouth 3 (three) times daily. Take 45 mls three times daily. 04/23/22   Armbruster, Willaim Rayas, MD  latanoprost (XALATAN) 0.005 % ophthalmic solution Place 1 drop into both eyes at bedtime. 08/20/20   [provider]  lidocaine (LIDODERM) 5 % Place 1 patch onto the skin daily as needed (pain). Remove & Discard patch within 12 hours or as directed by MD    [provider]  loratadine (CLARITIN) 10 MG tablet Take 10 mg by mouth daily. 09/04/20   [provider]  midodrine (PROAMATINE) 10 MG tablet Take 1 tablet (10 mg  total) by mouth 3 (three) times daily. 10/21/21   Armbruster, Willaim Rayas, MD  montelukast (SINGULAIR) 10 MG tablet Take 10 mg by mouth daily. 05/22/20   [provider]  Multiple Vitamin (MULTIVITAMIN WITH MINERALS) TABS tablet Take 1 tablet by mouth daily.    [provider]  pantoprazole (PROTONIX) 40 MG tablet Take 40 mg by mouth daily with breakfast.    [provider]  Polyvinyl Alcohol-Povidone PF (REFRESH) 1.4-0.6 % SOLN Apply 1 drop to eye daily as needed (dry eyes).    [provider]   rifaximin (XIFAXAN) 550 MG TABS tablet Take 1 tablet (550 mg total) by mouth 2 (two) times daily. 04/03/22   Mansouraty, Netty Starring., MD  spironolactone (ALDACTONE) 25 MG tablet Take 1 tablet (25 mg total) by mouth daily. 04/10/22   Shalhoub, Deno Lunger, MD  tamsulosin (FLOMAX) 0.4 MG CAPS capsule Take 0.4 mg by mouth in the morning and at bedtime.    [provider]  traZODone (DESYREL) 150 MG tablet Take 75 mg by mouth at bedtime as needed for sleep. 09/28/19   [provider]  Zinc Oxide (TRIPLE PASTE) 12.8 % ointment Apply topically 2 (two) times daily. Apply to perianal region, folds of groin and both axilla 04/10/22   Shalhoub, Deno Lunger, MD      Allergies    Lisinopril    Review of Systems   Review of Systems  Constitutional:  Negative for decreased appetite and fever.  Cardiovascular:  Negative for palpitations.  Gastrointestinal:  Negative for abdominal pain, nausea and vomiting.  Genitourinary:  Negative for bladder incontinence.  Skin:  Negative for rash.  Neurological:  Negative for seizures, weakness, light-headedness and headaches.  Psychiatric/Behavioral:  Positive for confusion. Negative for hallucinations.     Physical Exam Updated Vital Signs  ED Triage Vitals  Encounter Vitals Group     BP 03/01/23 1445 106/72     Systolic BP Percentile --      Diastolic BP Percentile --      Pulse Rate 03/01/23 1445 86     Resp 03/01/23 1445 (!) 24     Temp 03/01/23 1449 98.4 F (36.9 C)     Temp Source 03/01/23 1449 Oral     SpO2 03/01/23 1445 100 %     Weight 03/01/23 1448 168 lb (76.2 kg)     Height 03/01/23 1448 5\' 7"  (1.702 m)     Head Circumference --      Peak Flow --      Pain Score 03/01/23 1444 0     Pain Loc --      Pain Education --      Exclude from Growth Chart --     Physical Exam Vitals and nursing note reviewed. Exam conducted with a chaperone present.  Constitutional:      General: He is not in acute distress.    Appearance: He is  well-developed. He is not ill-appearing.  HENT:     Head: Normocephalic and atraumatic.     Nose: Nose normal.     Mouth/Throat:     Mouth: Mucous membranes are moist.  Eyes:     Extraocular Movements: Extraocular movements intact.     Conjunctiva/sclera: Conjunctivae normal.     Pupils: Pupils are equal, round, and reactive to light.  Cardiovascular:     Rate and Rhythm: Normal rate and regular rhythm.     Heart sounds: No murmur heard. Pulmonary:     Effort: Pulmonary effort is  normal. No respiratory distress.     Breath sounds: Normal breath sounds.  Abdominal:     General: Abdomen is flat.     Palpations: Abdomen is soft.     Tenderness: There is no abdominal tenderness.  Genitourinary:    Comments: Brownish dark stool  Musculoskeletal:        General: No swelling.     Cervical back: Normal range of motion and neck supple.     Comments:    Skin:    General: Skin is warm and dry.     Capillary Refill: Capillary refill takes less than 2 seconds.  Neurological:     General: No focal deficit present.     Mental Status: He is alert.     Sensory: No sensory deficit.     Motor: No weakness.     Comments: Mild encephalopathy, able to answer questions and follow commands   Psychiatric:        Mood and Affect: Mood normal.     ED Results / Procedures / Treatments   Labs (all labs ordered are listed, but only abnormal results are displayed) Labs Reviewed  COMPREHENSIVE METABOLIC PANEL - Abnormal; Notable for the following components:      Result Value   Sodium 129 (*)    Chloride 92 (*)    Glucose, Bld 190 (*)    BUN 38 (*)    Creatinine, Ser 1.39 (*)    Total Protein 6.2 (*)    Albumin 2.5 (*)    AST 56 (*)    GFR, Estimated 53 (*)    All other components within normal limits  CBC - Abnormal; Notable for the following components:   RBC 2.91 (*)    Hemoglobin 10.2 (*)    HCT 29.6 (*)    MCV 101.7 (*)    MCH 35.1 (*)    Platelets 92 (*)    All other  components within normal limits  AMMONIA - Abnormal; Notable for the following components:   Ammonia 67 (*)    All other components within normal limits  PROTIME-INR - Abnormal; Notable for the following components:   Prothrombin Time 16.0 (*)    INR 1.3 (*)    All other components within normal limits  POC OCCULT BLOOD, ED - Abnormal; Notable for the following components:   Fecal Occult Bld POSITIVE (*)    All other components within normal limits  URINALYSIS, ROUTINE W REFLEX MICROSCOPIC  LIPASE, BLOOD  TYPE AND SCREEN    EKG EKG Interpretation Date/Time:  Tuesday March 01 2023 14:50:00 EST Ventricular Rate:  106 PR Interval:  193 QRS Duration:  90 QT Interval:  345 QTC Calculation: 459 R Axis:   46  Text Interpretation: Sinus tachycardia Atrial premature complexes Consider anterior infarct Confirmed by Virgina Norfolk [id number]) on 03/01/2023 3:52:34 PM  Radiology CT ABDOMEN PELVIS W CONTRAST  Result Date: 03/01/2023 CLINICAL DATA:  Abdominal pain. EXAM: CT ABDOMEN AND PELVIS WITH CONTRAST TECHNIQUE: Multidetector CT imaging of the abdomen and pelvis was performed using the standard protocol following bolus administration of intravenous contrast. RADIATION DOSE REDUCTION: This exam was performed according to the departmental dose-optimization program which includes automated exposure control, adjustment of the mA and/or kV according to patient size and/or use of iterative reconstruction technique. CONTRAST:  75mL OMNIPAQUE IOHEXOL 350 MG/ML SOLN COMPARISON:  CT of the chest abdomen pelvis dated 04/01/2022. FINDINGS: Lower chest: Partially visualized small bilateral pleural effusions. There are consolidative changes of the visualized lower lobes  which may represent atelectasis or infiltrate. No intra-abdominal free air or free fluid. Hepatobiliary: Cirrhosis.  No biliary dilatation.  Cholecystectomy. Pancreas: Unremarkable. No pancreatic ductal dilatation or surrounding inflammatory  changes. Spleen: Splenomegaly measuring 16 cm in length. Adrenals/Urinary Tract: The adrenal glands unremarkable. Severe chronic left hydronephrosis and left renal parenchyma atrophy. There is a 5 mm nonobstructing stone in the interpolar left kidney. There is a 6 mm stone in the distal left ureter with moderate left hydroureter. Punctate nonobstructing right renal upper pole calculus. No hydronephrosis on the right. The right ureter is unremarkable. The urinary bladder is minimally distended. There is diffuse thickening of the anterior bladder wall with mild trabeculation which may be related to chronic bladder outlet obstruction. Correlation with urinalysis recommended to exclude UTI. Stomach/Bowel: Small hiatal hernia. Mild thickened appearance of the distal esophagus may represent esophagitis related to reflux. Clinical correlation recommended. There is no bowel obstruction. The appendix is normal. Vascular/Lymphatic: Mild aortoiliac atherosclerotic disease. The IVC is unremarkable. A TIPS is noted. The SMV, splenic vein, and main portal vein appear patent. No portal venous gas. No adenopathy. Reproductive: The prostate and seminal vesicles are grossly unremarkable. No pelvic mass. Other: None Musculoskeletal: Osteopenia with degenerative changes. Age indeterminate, chronic appearing compression fracture of the inferior endplate of T12. No acute osseous pathology. IMPRESSION: 1. A 6 mm stone in the distal left ureter with moderate left hydroureter. 2. Severe chronic left hydronephrosis and left renal parenchyma atrophy. 3. Nonobstructing bilateral renal calculi. No hydronephrosis on the right. 4. Cirrhosis with splenomegaly. 5. Partially visualized small bilateral pleural effusions with bibasilar atelectasis or infiltrate. 6.  Aortic Atherosclerosis (ICD10-I70.0). Electronically Signed   By: Elgie Collard M.D.   On: 03/01/2023 20:10   CT Head Wo Contrast  Result Date: 03/01/2023 CLINICAL DATA:  Altered  mental status EXAM: CT HEAD WITHOUT CONTRAST TECHNIQUE: Contiguous axial images were obtained from the base of the skull through the vertex without intravenous contrast. RADIATION DOSE REDUCTION: This exam was performed according to the departmental dose-optimization program which includes automated exposure control, adjustment of the mA and/or kV according to patient size and/or use of iterative reconstruction technique. COMPARISON:  02/01/2023 FINDINGS: Brain: No evidence of acute infarction, hemorrhage, mass, mass effect, or midline shift. No hydrocephalus or extra-axial fluid collection. Basal ganglia calcifications. Age related cerebral atrophy. Periventricular white matter changes, likely the sequela of chronic small vessel ischemic disease. Vascular: No hyperdense vessel. Skull: Negative for fracture or focal lesion. Sinuses/Orbits: No acute finding. Status post bilateral lens replacements. Other: Trace fluid in the left mastoid air cells. IMPRESSION: No acute intracranial process. Electronically Signed   By: Wiliam Ke M.D.   On: 03/01/2023 19:21   DG Chest Portable 1 View  Result Date: 03/01/2023 CLINICAL DATA:  Weakness.  Altered mental status. EXAM: PORTABLE CHEST 1 VIEW COMPARISON:  01/12/2023. FINDINGS: Redemonstration of small-to-moderate left pleural effusion with associated compressive atelectatic changes in the left lung. There is trace right pleural effusion, slightly decreased since the prior study. Bilateral lung fields are otherwise clear. No acute consolidation or lung collapse. No pneumothorax. No pulmonary edema. Stable cardio-mediastinal silhouette. No acute osseous abnormalities. The soft tissues are within normal limits. IMPRESSION: *Redemonstration of bilateral pleural effusions, left-greater-than-right. No acute consolidation or lung collapse. Electronically Signed   By: Jules Schick M.D.   On: 03/01/2023 16:02    Procedures Procedures    Medications Ordered in  ED Medications  pantoprazole (PROTONIX) injection 40 mg (40 mg Intravenous Not Given 03/01/23 1847)  Followed by  pantoprazole (PROTONIX) injection 40 mg (has no administration in time range)  octreotide (SANDOSTATIN) 2 mcg/mL load via infusion 50 mcg (50 mcg Intravenous Bolus from Bag 03/01/23 1902)    And  octreotide (SANDOSTATIN) 500 mcg in sodium chloride 0.9 % 250 mL (2 mcg/mL) infusion (50 mcg/hr Intravenous New Bag/Given 03/01/23 1900)  iohexol (OMNIPAQUE) 350 MG/ML injection 75 mL (75 mLs Intravenous Contrast Given 03/01/23 1831)    ED Course/ Medical Decision Making/ A&P                                 Medical Decision Making Amount and/or Complexity of Data Reviewed Labs: ordered. Radiology: ordered.  Risk Prescription drug management. Decision regarding hospitalization.   Corey Wood is here with confusion and generalized weakness and some dark stools.  Patient has history of cirrhosis, Parkinson's, stroke.  Patient arrives unremarkable vitals.  Does have some dark stool on exam with a positive Hemoccult.  Dark stool only for today.  Has history of variceal bleeds.  He has not had any hematemesis.  He has been a little bit more confused.  He does not have bowel movements despite taking lactulose.  Differential is wide this could be infection process or hepatic encephalopathy or GI bleed will do broad workup with labs, CT head, CT abdomen and pelvis.  Will touch base with his GI team.  Overall, hemoglobin slightly lower than his baseline to 10.2 from 12.  Ammonia level 67.  INR is 1.3.  I reviewed interpreted labs and imaging.  Sodium is 129.  Creatinine is 1.39.  Chest x-ray shows no evidence of pneumonia.  Stable chest x-ray findings.  CT scan of the abdomen and pelvis incidentally found an obstructive 6 mm stone but he is not having any pain and urinalysis negative for infection.  This overall appears to be incidental finding.  Overall I talked with Dr. Rhea Belton with GI he  recommends octreotide and Protonix and they will evaluate the patient in the morning.  He has been hemodynamically stable here.  Head CT was unremarkable.  Will admit to medicine for further care.  This chart was dictated using voice recognition software.  Despite best efforts to proofread,  errors can occur which can change the documentation meaning.         Final Clinical Impression(s) / ED Diagnoses Final diagnoses:  Gastrointestinal hemorrhage, unspecified gastrointestinal hemorrhage type  Altered mental status, unspecified altered mental status type    Rx / DC Orders ED Discharge Orders     None         Virgina Norfolk, DO 03/01/23 2151

## 2023-03-01 NOTE — ED Triage Notes (Signed)
Pt is coming in with dark stool starting Saturday. Pt has history of same. Pt complaining of weakness and malaise.   Ems vs  Hr 86 - afib Bp 120/70  98% on 4l - baseline 3l  Cbg 327

## 2023-03-01 NOTE — H&P (Incomplete)
PCP:   [facility]   Chief Complaint:  Black stools.  HPI: This is a 72 year old male with past medical history of COPD, anxiety depression, T2DM, HLD, HTN, hypothyroidism, neuromuscular disorder, OSA, chronic oxygen dependency, history of stroke.  He additionally has cirrhosis secondary to NASH.  He has a history of variceal bleeding, hepatic hydrothorax, portal hypertensive gastritis intolerant to beta-blockade due to bradycardia and hypotension.  He is sp EGD with banding and TIPS.  His wife brings him in today because for the last 2 days his stools have been almost black.  He has had some intermittent confusion.  He has been awake or had restless sleep for the past 2 nights of tingling down to the right big extremity.  She additionally endorses subjective fever last night.  She was unable to check the temperature due to his encephalopathy and lack of cooperation.  His wife brought him to the ER.  In the ER patient is with both of borderline hypotension.  Vitals otherwise stable.  Patient's occult stool positive.  Sodium 129, albumin 2.5, ammonia 67, hemoglobin 10.2 9 previous hemoglobin 12.5 11/2022], platelets 92 [chronic], INR 1.3, CXR redemonstrates B/L pleural effusions L>R.[name] Kitchen  GI contacted by EDP recommended Protonix and octreotide infusions.  Formal consult in AM.  Review of Systems:  Per HPI  Past Medical History: Past Medical History:  Diagnosis Date   Allergy    seasonal allergies   Anxiety    on meds   Aortic valve disorder    Arthritis    generalized (fingers)(shoulders)   Asthma    uses inhaler   Cataract    bilateral -sx    Cirrhosis (HCC)    CKD (chronic kidney disease), stage III (HCC)    only has one kidney   COPD (chronic obstructive pulmonary disease) (HCC)    Depression    on meds   DM (diabetes mellitus) (HCC)    Type II   Dyspnea    GERD (gastroesophageal reflux disease)    on meds   Heart murmur    History of colon polyps    History of  COVID-19 10/14/2020   History of kidney stones    Hx of acute pancreatitis 10/2019   Hyperlipidemia    on meds   Hypertension    on meds   Hypothyroidism    not on meds at this time   Myelodysplastic syndrome (HCC)    Neuromuscular disorder (HCC)    per pt   Obstructive sleep apnea    Oxygen deficiency    Parkinsonism (HCC)    per pt report   Peripheral edema    bilateral legs   Peripheral positional vertigo    Skin cancer of anterior chest    Sleep apnea    No CPAP   Stroke [facility])    Past Surgical History:  Procedure Laterality Date   ANKLE SURGERY     CHOLECYSTECTOMY     ENDARTERECTOMY Left 04/29/2020   Procedure: CAROTID EXPLORATION removal of  central venous catheter;  Surgeon: Larina Earthly, MD;  Location: [facility] OR;  Service: Vascular;  Laterality: Left;   ESOPHAGEAL BANDING N/A 04/17/2020   Procedure: ESOPHAGEAL BANDING;  Surgeon: Benancio Deeds, MD;  Location: [facility] ENDOSCOPY;  Service: Gastroenterology;  Laterality: N/A;   ESOPHAGEAL BANDING  04/29/2020   Procedure: ESOPHAGEAL BANDING;  Surgeon: Meridee Score Netty Starring., MD;  Location: [facility] ENDOSCOPY;  Service: Gastroenterology;;   ESOPHAGEAL BANDING N/A 06/10/2020   Procedure: ESOPHAGEAL BANDING;  Surgeon: Ileene Patrick  P, MD;  Location: [facility] ENDOSCOPY;  Service: Gastroenterology;  Laterality: N/A;   ESOPHAGEAL BANDING N/A 11/10/2020   Procedure: ESOPHAGEAL BANDING;  Surgeon: Benancio Deeds, MD;  Location: [facility] ENDOSCOPY;  Service: Gastroenterology;  Laterality: N/A;   ESOPHAGEAL BANDING N/A 02/10/2021   Procedure: ESOPHAGEAL BANDING;  Surgeon: Beverley Fiedler, MD;  Location: [facility] ENDOSCOPY;  Service: Gastroenterology;  Laterality: N/A;   ESOPHAGEAL BANDING N/A 04/21/2021   Procedure: ESOPHAGEAL BANDING;  Surgeon: Rachael Fee, MD;  Location: [facility] ENDOSCOPY;  Service: Endoscopy;  Laterality: N/A;   ESOPHAGEAL BANDING N/A 07/16/2021   Procedure: ESOPHAGEAL BANDING;  Surgeon: Meryl Dare, MD;  Location: [facility] ENDOSCOPY;   Service: Gastroenterology;  Laterality: N/A;   ESOPHAGEAL BANDING  01/18/2022   Procedure: ESOPHAGEAL BANDING;  Surgeon: Iva Boop, MD;  Location: [facility] ENDOSCOPY;  Service: Gastroenterology;;   ESOPHAGOGASTRODUODENOSCOPY N/A 04/25/2020   Procedure: ESOPHAGOGASTRODUODENOSCOPY (EGD);  Surgeon: Jeani Hawking, MD;  Location: [facility] ENDOSCOPY;  Service: Endoscopy;  Laterality: N/A;   ESOPHAGOGASTRODUODENOSCOPY (EGD) WITH PROPOFOL N/A 04/17/2020   Procedure: ESOPHAGOGASTRODUODENOSCOPY (EGD) WITH PROPOFOL;  Surgeon: Benancio Deeds, MD;  Location: [facility];  Service: Gastroenterology;  Laterality: N/A;   ESOPHAGOGASTRODUODENOSCOPY (EGD) WITH PROPOFOL N/A 04/29/2020   Procedure: ESOPHAGOGASTRODUODENOSCOPY (EGD) WITH PROPOFOL;  Surgeon: Meridee Score Netty Starring., MD;  Location: [facility] ENDOSCOPY;  Service: Gastroenterology;  Laterality: N/A;   ESOPHAGOGASTRODUODENOSCOPY (EGD) WITH PROPOFOL N/A 06/10/2020   Procedure: ESOPHAGOGASTRODUODENOSCOPY (EGD) WITH PROPOFOL;  Surgeon: Benancio Deeds, MD;  Location: [facility] ENDOSCOPY;  Service: Gastroenterology;  Laterality: N/A;   ESOPHAGOGASTRODUODENOSCOPY (EGD) WITH PROPOFOL N/A 11/10/2020   Procedure: ESOPHAGOGASTRODUODENOSCOPY (EGD) WITH PROPOFOL;  Surgeon: Benancio Deeds, MD;  Location: [facility];  Service: Gastroenterology;  Laterality: N/A;   ESOPHAGOGASTRODUODENOSCOPY (EGD) WITH PROPOFOL N/A 02/10/2021   Procedure: ESOPHAGOGASTRODUODENOSCOPY (EGD) WITH PROPOFOL;  Surgeon: Beverley Fiedler, MD;  Location: [facility] ENDOSCOPY;  Service: Gastroenterology;  Laterality: N/A;   ESOPHAGOGASTRODUODENOSCOPY (EGD) WITH PROPOFOL N/A 03/31/2021   Procedure: ESOPHAGOGASTRODUODENOSCOPY (EGD) WITH PROPOFOL;  Surgeon: Benancio Deeds, MD;  Location: [facility] ENDOSCOPY;  Service: Gastroenterology;  Laterality: N/A;   ESOPHAGOGASTRODUODENOSCOPY (EGD) WITH PROPOFOL N/A 04/21/2021   Procedure: ESOPHAGOGASTRODUODENOSCOPY (EGD) WITH PROPOFOL;  Surgeon: Rachael Fee, MD;  Location: [facility]  ENDOSCOPY;  Service: Endoscopy;  Laterality: N/A;   ESOPHAGOGASTRODUODENOSCOPY (EGD) WITH PROPOFOL N/A 07/16/2021   Procedure: ESOPHAGOGASTRODUODENOSCOPY (EGD) WITH PROPOFOL;  Surgeon: Meryl Dare, MD;  Location: [facility] ENDOSCOPY;  Service: Gastroenterology;  Laterality: N/A;   ESOPHAGOGASTRODUODENOSCOPY (EGD) WITH PROPOFOL N/A 10/15/2021   Procedure: ESOPHAGOGASTRODUODENOSCOPY (EGD) WITH PROPOFOL;  Surgeon: Benancio Deeds, MD;  Location: [facility] ENDOSCOPY;  Service: Gastroenterology;  Laterality: N/A;   ESOPHAGOGASTRODUODENOSCOPY (EGD) WITH PROPOFOL N/A 01/18/2022   Procedure: ESOPHAGOGASTRODUODENOSCOPY (EGD) WITH PROPOFOL;  Surgeon: Iva Boop, MD;  Location: [facility] ENDOSCOPY;  Service: Gastroenterology;  Laterality: N/A;   EYE SURGERY     HERNIA REPAIR     IR INTRAVASCULAR ULTRASOUND NON CORONARY  03/26/2022   IR PARACENTESIS  09/18/2020   IR RADIOLOGIST EVAL & MGMT  10/22/2021   IR THORACENTESIS ASP PLEURAL SPACE W/IMG GUIDE  10/05/2021   IR THORACENTESIS ASP PLEURAL SPACE W/IMG GUIDE  12/27/2022   IR TIPS  03/26/2022   IR US GUIDE VASC ACCESS RIGHT  03/26/2022   KIDNEY STONE SURGERY     RADIOLOGY WITH ANESTHESIA N/A 03/26/2022   Procedure: TIPS;  Surgeon: Berdine Dance, MD;  Location: [facility] OR;  Service: Radiology;  Laterality: N/A;   WISDOM TOOTH EXTRACTION      Medications: Prior  to Admission medications   Medication Sig Start Date End Date Taking? Authorizing Provider  acetaminophen (TYLENOL) 325 MG tablet Take 325 mg by mouth every 6 (six) hours as needed for moderate pain.    [provider]  albuterol (VENTOLIN HFA) 108 (90 Base) MCG/ACT inhaler Inhale 1-2 puffs into the lungs every 6 (six) hours as needed for wheezing or shortness of breath.    [provider]  [facility] HFA 100 MCG/ACT AERO Inhale 1 puff into the lungs daily.    [provider]  carbidopa-levodopa (SINEMET IR) 25-100 MG tablet Take 1.5 tablets by mouth in the morning, at noon, and at  bedtime. 06/30/21   [provider]  CODEINE SULFATE PO Take 5 mLs by mouth at bedtime.    [provider]  DULoxetine (CYMBALTA) 20 MG capsule Take 40 mg by mouth daily.    [provider]  ferrous sulfate 325 (65 FE) MG EC tablet Take 325 mg by mouth 3 (three) times a week. MWF    [provider]  fluticasone (FLONASE) 50 MCG/ACT nasal spray Place 1 spray into both nostrils daily as needed for allergies.    [provider]  furosemide (LASIX) 20 MG tablet Take 20 mg by mouth daily.    [provider]  guaiFENesin (DIABETIC TUSSIN EX) 100 MG/5ML liquid Take 10 mLs by mouth at bedtime.    [provider]  insulin glargine (LANTUS) 100 unit/mL SOPN Inject 16 Units into the skin daily.    [provider]  ipratropium (ATROVENT) 0.03 % nasal spray Place 1 spray into both nostrils at bedtime.    [provider]  lactulose (CHRONULAC) 10 GM/15ML solution Take 45 mLs (30 g total) by mouth 3 (three) times daily. Hold this medication if you have had greater than three loose stools in one day. 04/10/22   Shalhoub, Deno Lunger, MD  lactulose (CONSTULOSE) 10 GM/15ML solution Take 15 mLs (10 g total) by mouth 3 (three) times daily. Take 45 mls three times daily. 04/23/22   Armbruster, Willaim Rayas, MD  latanoprost (XALATAN) 0.005 % ophthalmic solution Place 1 drop into both eyes at bedtime. 08/20/20   [provider]  lidocaine (LIDODERM) 5 % Place 1 patch onto the skin daily as needed (pain). Remove & Discard patch within 12 hours or as directed by MD    [provider]  loratadine (CLARITIN) 10 MG tablet Take 10 mg by mouth daily. 09/04/20   [provider]  midodrine (PROAMATINE) 10 MG tablet Take 1 tablet (10 mg total) by mouth 3 (three) times daily. 10/21/21   Armbruster, Willaim Rayas, MD  montelukast (SINGULAIR) 10 MG tablet Take 10 mg by mouth daily. 05/22/20   [provider]  Multiple Vitamin (MULTIVITAMIN  WITH MINERALS) TABS tablet Take 1 tablet by mouth daily.    [provider]  pantoprazole (PROTONIX) 40 MG tablet Take 40 mg by mouth daily with breakfast.    [provider]  Polyvinyl Alcohol-Povidone PF (REFRESH) 1.4-0.6 % SOLN Apply 1 drop to eye daily as needed (dry eyes).    [provider]  rifaximin (XIFAXAN) 550 MG TABS tablet Take 1 tablet (550 mg total) by mouth 2 (two) times daily. 04/03/22   Mansouraty, Netty Starring., MD  spironolactone (ALDACTONE) 25 MG tablet Take 1 tablet (25 mg total) by mouth daily. 04/10/22   Shalhoub, Deno Lunger, MD  tamsulosin (FLOMAX) 0.4 MG CAPS capsule Take 0.4 mg by mouth in the morning and at  bedtime.    [provider]  traZODone (DESYREL) 150 MG tablet Take 75 mg by mouth at bedtime as needed for sleep. 09/28/19   [provider]  Zinc Oxide (TRIPLE PASTE) 12.8 % ointment Apply topically 2 (two) times daily. Apply to perianal region, folds of groin and both axilla 04/10/22   Shalhoub, Deno Lunger, MD    Allergies:   Allergies  Allergen Reactions   Lisinopril Cough    Social History:  reports that he has never smoked. He has never been exposed to tobacco smoke. He has never used smokeless tobacco. He reports current alcohol use of about 1.0 standard drink of alcohol per week. He reports that he does not use drugs.  Family History: Family History  Problem Relation Age of Onset   Liver cancer Mother    Colon cancer Neg Hx    Stomach cancer Neg Hx    Colon polyps Neg Hx    Esophageal cancer Neg Hx    Rectal cancer Neg Hx     Physical Exam: Vitals:   03/01/23 2215 03/01/23 2245 03/01/23 2300 03/01/23 2315  BP: 93/60 (!) 117/57 (!) 113/43 90/64  Pulse: 84 (!) 41 75 77  Resp: (!) 21 (!) 22 19 (!) 23  Temp:  98.1 F (36.7 C)    TempSrc:      SpO2: 99% 100% 100% 100%  Weight:      Height:        General: Somnolent, sleepy but arousable male,, well developed, no acute distress Eyes: Pink conjunctiva, no  scleral icterus ENT: Moist oral mucosa, neck supple, no thyromegaly Lungs: CTA B/L, no wheeze, no crackles, no use of accessory muscles Cardiovascular: RRR, no regurgitation, no gallops, no murmurs. No carotid bruits, no JVD Abdomen: soft, positive BS, nontender, no organomegaly, not an acute abdomen GU: not examined Neuro: CN II - XII grossly intact, sensation intact Musculoskeletal: strength 5/5 all extremities, no edema Skin: Small sacral decub Psych: Encephalopathic male  Labs on Admission:  Recent Labs    03/01/23 1536  NA 129*  K 4.3  CL 92*  CO2 26  GLUCOSE 190*  BUN 38*  CREATININE 1.39*  CALCIUM 10.2   Recent Labs    03/01/23 1536  AST 56*  ALT 12  ALKPHOS 94  BILITOT 1.1  PROT 6.2*  ALBUMIN 2.5*   Recent Labs    03/01/23 1636  LIPASE 25   Recent Labs    03/01/23 1536  WBC 5.3  HGB 10.2*  HCT 29.6*  MCV 101.7*  PLT 92*    Radiological Exams on Admission: CT ABDOMEN PELVIS W CONTRAST  Result Date: 03/01/2023 CLINICAL DATA:  Abdominal pain. EXAM: CT ABDOMEN AND PELVIS WITH CONTRAST TECHNIQUE: Multidetector CT imaging of the abdomen and pelvis was performed using the standard protocol following bolus administration of intravenous contrast. RADIATION DOSE REDUCTION: This exam was performed according to the departmental dose-optimization program which includes automated exposure control, adjustment of the mA and/or kV according to patient size and/or use of iterative reconstruction technique. CONTRAST:  75mL OMNIPAQUE IOHEXOL 350 MG/ML SOLN COMPARISON:  CT of the chest abdomen pelvis dated 04/01/2022. FINDINGS: Lower chest: Partially visualized small bilateral pleural effusions. There are consolidative changes of the visualized lower lobes which may represent atelectasis or infiltrate. No intra-abdominal free air or free fluid. Hepatobiliary: Cirrhosis.  No biliary dilatation.  Cholecystectomy. Pancreas: Unremarkable. No pancreatic ductal dilatation or  surrounding inflammatory changes. Spleen: Splenomegaly measuring 16 cm in length. Adrenals/Urinary Tract: The adrenal  glands unremarkable. Severe chronic left hydronephrosis and left renal parenchyma atrophy. There is a 5 mm nonobstructing stone in the interpolar left kidney. There is a 6 mm stone in the distal left ureter with moderate left hydroureter. Punctate nonobstructing right renal upper pole calculus. No hydronephrosis on the right. The right ureter is unremarkable. The urinary bladder is minimally distended. There is diffuse thickening of the anterior bladder wall with mild trabeculation which may be related to chronic bladder outlet obstruction. Correlation with urinalysis recommended to exclude UTI. Stomach/Bowel: Small hiatal hernia. Mild thickened appearance of the distal esophagus may represent esophagitis related to reflux. Clinical correlation recommended. There is no bowel obstruction. The appendix is normal. Vascular/Lymphatic: Mild aortoiliac atherosclerotic disease. The IVC is unremarkable. A TIPS is noted. The SMV, splenic vein, and main portal vein appear patent. No portal venous gas. No adenopathy. Reproductive: The prostate and seminal vesicles are grossly unremarkable. No pelvic mass. Other: None Musculoskeletal: Osteopenia with degenerative changes. Age indeterminate, chronic appearing compression fracture of the inferior endplate of T12. No acute osseous pathology. IMPRESSION: 1. A 6 mm stone in the distal left ureter with moderate left hydroureter. 2. Severe chronic left hydronephrosis and left renal parenchyma atrophy. 3. Nonobstructing bilateral renal calculi. No hydronephrosis on the right. 4. Cirrhosis with splenomegaly. 5. Partially visualized small bilateral pleural effusions with bibasilar atelectasis or infiltrate. 6.  Aortic Atherosclerosis (ICD10-I70.0). Electronically Signed   By: Elgie Collard M.D.   On: 03/01/2023 20:10   CT Head Wo Contrast  Result Date:  03/01/2023 CLINICAL DATA:  Altered mental status EXAM: CT HEAD WITHOUT CONTRAST TECHNIQUE: Contiguous axial images were obtained from the base of the skull through the vertex without intravenous contrast. RADIATION DOSE REDUCTION: This exam was performed according to the departmental dose-optimization program which includes automated exposure control, adjustment of the mA and/or kV according to patient size and/or use of iterative reconstruction technique. COMPARISON:  02/01/2023 FINDINGS: Brain: No evidence of acute infarction, hemorrhage, mass, mass effect, or midline shift. No hydrocephalus or extra-axial fluid collection. Basal ganglia calcifications. Age related cerebral atrophy. Periventricular white matter changes, likely the sequela of chronic small vessel ischemic disease. Vascular: No hyperdense vessel. Skull: Negative for fracture or focal lesion. Sinuses/Orbits: No acute finding. Status post bilateral lens replacements. Other: Trace fluid in the left mastoid air cells. IMPRESSION: No acute intracranial process. Electronically Signed   By: Wiliam Ke M.D.   On: 03/01/2023 19:21   DG Chest Portable 1 View  Result Date: 03/01/2023 CLINICAL DATA:  Weakness.  Altered mental status. EXAM: PORTABLE CHEST 1 VIEW COMPARISON:  01/12/2023. FINDINGS: Redemonstration of small-to-moderate left pleural effusion with associated compressive atelectatic changes in the left lung. There is trace right pleural effusion, slightly decreased since the prior study. Bilateral lung fields are otherwise clear. No acute consolidation or lung collapse. No pneumothorax. No pulmonary edema. Stable cardio-mediastinal silhouette. No acute osseous abnormalities. The soft tissues are within normal limits. IMPRESSION: *Redemonstration of bilateral pleural effusions, left-greater-than-right. No acute consolidation or lung collapse. Electronically Signed   By: Jules Schick M.D.   On: 03/01/2023 16:02    Assessment/Plan Present on  Admission:  GI bleeding  Bleeding esophageal varices (HCC) -spTIPs -N.p.o., IV fluid hydration -Serial H&H to 6 -Continue Protonix every 12, octreotide drip. -GI Dr. Adela Lank contacted by EDP.  Formal consult in a.m.   Hepatic encephalopathy (HCC) -Continue rifaximin twice daily -Add lactulose.  Ammonia level in a.m. -Trazodone   Pancytopenia (HCC)  Liver cirrhosis secondary to NASH (HCC)//chronic  thrombocytopenia  Portal hypertension (HCC) -Family/wife was present at bedside.  She is amenable to hospice consult -Ammonia level ordered for a.m. -Midodrine 20 mg p.o. 3 times daily resumed    T2DM -Sliding scale insulin initiated every 4.   -Holding lantus w/ borderline glucose   Chronic obstructive pulmonary disease (HCC)  Chronic respiratory failure 3L basline -Stable at baseline -Nebulizers as needed.  Flonase resumed   Physical deconditioning -now mostly in the [facility] d/t weakness -PT consult when more alert, per a.m. team   Parkinson's disease (HCC) -Sinemet resumed   Chronic kidney disease, stage 3a (HCC) -Stable at baseline  Most medications on hold until patient's mentation is clear.Joneen Roach, Berneita Sanagustin 03/01/2023, 11:37 PM

## 2023-03-02 DIAGNOSIS — F32A Depression, unspecified: Secondary | ICD-10-CM | POA: Diagnosis present

## 2023-03-02 DIAGNOSIS — Z85828 Personal history of other malignant neoplasm of skin: Secondary | ICD-10-CM | POA: Diagnosis not present

## 2023-03-02 DIAGNOSIS — K922 Gastrointestinal hemorrhage, unspecified: Secondary | ICD-10-CM | POA: Diagnosis present

## 2023-03-02 DIAGNOSIS — Z7189 Other specified counseling: Secondary | ICD-10-CM | POA: Diagnosis not present

## 2023-03-02 DIAGNOSIS — K921 Melena: Secondary | ICD-10-CM

## 2023-03-02 DIAGNOSIS — D62 Acute posthemorrhagic anemia: Secondary | ICD-10-CM | POA: Diagnosis present

## 2023-03-02 DIAGNOSIS — J4489 Other specified chronic obstructive pulmonary disease: Secondary | ICD-10-CM | POA: Diagnosis present

## 2023-03-02 DIAGNOSIS — K3189 Other diseases of stomach and duodenum: Secondary | ICD-10-CM | POA: Diagnosis not present

## 2023-03-02 DIAGNOSIS — I4891 Unspecified atrial fibrillation: Secondary | ICD-10-CM | POA: Diagnosis not present

## 2023-03-02 DIAGNOSIS — D61818 Other pancytopenia: Secondary | ICD-10-CM | POA: Diagnosis present

## 2023-03-02 DIAGNOSIS — E785 Hyperlipidemia, unspecified: Secondary | ICD-10-CM | POA: Diagnosis present

## 2023-03-02 DIAGNOSIS — R188 Other ascites: Secondary | ICD-10-CM

## 2023-03-02 DIAGNOSIS — K766 Portal hypertension: Secondary | ICD-10-CM | POA: Diagnosis present

## 2023-03-02 DIAGNOSIS — Z794 Long term (current) use of insulin: Secondary | ICD-10-CM | POA: Diagnosis not present

## 2023-03-02 DIAGNOSIS — E039 Hypothyroidism, unspecified: Secondary | ICD-10-CM | POA: Diagnosis present

## 2023-03-02 DIAGNOSIS — N1831 Chronic kidney disease, stage 3a: Secondary | ICD-10-CM | POA: Diagnosis present

## 2023-03-02 DIAGNOSIS — K209 Esophagitis, unspecified without bleeding: Secondary | ICD-10-CM | POA: Diagnosis not present

## 2023-03-02 DIAGNOSIS — J9610 Chronic respiratory failure, unspecified whether with hypoxia or hypercapnia: Secondary | ICD-10-CM | POA: Diagnosis present

## 2023-03-02 DIAGNOSIS — E871 Hypo-osmolality and hyponatremia: Secondary | ICD-10-CM | POA: Diagnosis present

## 2023-03-02 DIAGNOSIS — K2091 Esophagitis, unspecified with bleeding: Secondary | ICD-10-CM | POA: Diagnosis not present

## 2023-03-02 DIAGNOSIS — K264 Chronic or unspecified duodenal ulcer with hemorrhage: Secondary | ICD-10-CM | POA: Diagnosis present

## 2023-03-02 DIAGNOSIS — I129 Hypertensive chronic kidney disease with stage 1 through stage 4 chronic kidney disease, or unspecified chronic kidney disease: Secondary | ICD-10-CM | POA: Diagnosis present

## 2023-03-02 DIAGNOSIS — Z9981 Dependence on supplemental oxygen: Secondary | ICD-10-CM | POA: Diagnosis not present

## 2023-03-02 DIAGNOSIS — E1122 Type 2 diabetes mellitus with diabetic chronic kidney disease: Secondary | ICD-10-CM | POA: Diagnosis present

## 2023-03-02 DIAGNOSIS — K7469 Other cirrhosis of liver: Secondary | ICD-10-CM | POA: Diagnosis present

## 2023-03-02 DIAGNOSIS — K269 Duodenal ulcer, unspecified as acute or chronic, without hemorrhage or perforation: Secondary | ICD-10-CM | POA: Diagnosis not present

## 2023-03-02 DIAGNOSIS — Z860100 Personal history of colon polyps, unspecified: Secondary | ICD-10-CM

## 2023-03-02 DIAGNOSIS — Z8616 Personal history of COVID-19: Secondary | ICD-10-CM | POA: Diagnosis not present

## 2023-03-02 DIAGNOSIS — K2211 Ulcer of esophagus with bleeding: Secondary | ICD-10-CM | POA: Diagnosis present

## 2023-03-02 DIAGNOSIS — Z515 Encounter for palliative care: Secondary | ICD-10-CM | POA: Diagnosis not present

## 2023-03-02 DIAGNOSIS — G20A1 Parkinson's disease without dyskinesia, without mention of fluctuations: Secondary | ICD-10-CM | POA: Diagnosis present

## 2023-03-02 DIAGNOSIS — K7682 Hepatic encephalopathy: Secondary | ICD-10-CM | POA: Diagnosis present

## 2023-03-02 DIAGNOSIS — R4182 Altered mental status, unspecified: Secondary | ICD-10-CM | POA: Diagnosis not present

## 2023-03-02 DIAGNOSIS — N189 Chronic kidney disease, unspecified: Secondary | ICD-10-CM

## 2023-03-02 DIAGNOSIS — Z66 Do not resuscitate: Secondary | ICD-10-CM | POA: Diagnosis not present

## 2023-03-02 DIAGNOSIS — Z79899 Other long term (current) drug therapy: Secondary | ICD-10-CM | POA: Diagnosis not present

## 2023-03-02 DIAGNOSIS — D649 Anemia, unspecified: Secondary | ICD-10-CM | POA: Diagnosis not present

## 2023-03-02 DIAGNOSIS — K7460 Unspecified cirrhosis of liver: Secondary | ICD-10-CM | POA: Diagnosis not present

## 2023-03-02 LAB — BASIC METABOLIC PANEL
Anion gap: 4 — ABNORMAL LOW (ref 5–15)
BUN: 31 mg/dL — ABNORMAL HIGH (ref 8–23)
CO2: 31 mmol/L (ref 22–32)
Calcium: 9.5 mg/dL (ref 8.9–10.3)
Chloride: 96 mmol/L — ABNORMAL LOW (ref 98–111)
Creatinine, Ser: 1.3 mg/dL — ABNORMAL HIGH (ref 0.61–1.24)
GFR, Estimated: 58 mL/min — ABNORMAL LOW (ref >60)
Glucose, Bld: 101 mg/dL — ABNORMAL HIGH (ref 70–99)
Potassium: 4.4 mmol/L (ref 3.5–5.1)
Sodium: 131 mmol/L — ABNORMAL LOW (ref 135–145)

## 2023-03-02 LAB — CBG MONITORING, ED
Glucose-Capillary: 87 mg/dL (ref 70–99)
Glucose-Capillary: 105 mg/dL — ABNORMAL HIGH (ref 70–99)
Glucose-Capillary: 118 mg/dL — ABNORMAL HIGH (ref 70–99)
Glucose-Capillary: 131 mg/dL — ABNORMAL HIGH (ref 70–99)
Glucose-Capillary: 112 mg/dL — ABNORMAL HIGH (ref 70–99)
Glucose-Capillary: 105 mg/dL — ABNORMAL HIGH (ref 70–99)

## 2023-03-02 LAB — HEMOGLOBIN A1C
Hgb A1c MFr Bld: 6.5 % — ABNORMAL HIGH (ref 4.8–5.6)
Mean Plasma Glucose: 139.85 mg/dL

## 2023-03-02 LAB — HEMOGLOBIN AND HEMATOCRIT, BLOOD
HCT: 25.3 % — ABNORMAL LOW (ref 39.0–52.0)
HCT: 25.8 % — ABNORMAL LOW (ref 39.0–52.0)
HCT: 27.2 % — ABNORMAL LOW (ref 39.0–52.0)
HCT: 27.7 % — ABNORMAL LOW (ref 39.0–52.0)
HCT: 28.2 % — ABNORMAL LOW (ref 39.0–52.0)
Hemoglobin: 8.6 g/dL — ABNORMAL LOW (ref 13.0–17.0)
Hemoglobin: 8.9 g/dL — ABNORMAL LOW (ref 13.0–17.0)
Hemoglobin: 9.3 g/dL — ABNORMAL LOW (ref 13.0–17.0)
Hemoglobin: 9.8 g/dL — ABNORMAL LOW (ref 13.0–17.0)
Hemoglobin: 9.9 g/dL — ABNORMAL LOW (ref 13.0–17.0)

## 2023-03-02 LAB — GLUCOSE, CAPILLARY: Glucose-Capillary: 105 mg/dL — ABNORMAL HIGH (ref 70–99)

## 2023-03-02 MED ORDER — DEXTROSE 50 % IV SOLN
12.5000 g | INTRAVENOUS | Status: AC
Start: 2023-03-02 — End: 2023-03-02
  Administered 2023-03-02: 12.5 g via INTRAVENOUS
  Filled 2023-03-02: qty 50

## 2023-03-02 MED ORDER — FUROSEMIDE 20 MG PO TABS: 20.0000 mg | ORAL_TABLET | Freq: Every day | ORAL | Status: DC

## 2023-03-02 MED ORDER — INSULIN ASPART 100 UNIT/ML IJ SOLN
0.0000 [IU] | INTRAMUSCULAR | Status: DC
  Administered 2023-03-03: 1 [IU] via SUBCUTANEOUS
  Administered 2023-03-03: 3 [IU] via SUBCUTANEOUS

## 2023-03-02 MED ORDER — MIDODRINE HCL 5 MG PO TABS
20.0000 mg | ORAL_TABLET | Freq: Three times a day (TID) | ORAL | Status: DC
  Administered 2023-03-02 – 2023-03-05 (×11): 20 mg via ORAL
  Filled 2023-03-02 (×11): qty 4

## 2023-03-02 MED ORDER — DULOXETINE HCL 20 MG PO CPEP
20.0000 mg | ORAL_CAPSULE | Freq: Every day | ORAL | Status: DC
Start: 2023-03-02 — End: 2023-03-02

## 2023-03-02 MED ORDER — CARBIDOPA-LEVODOPA 25-100 MG PO TABS
2.0000 | ORAL_TABLET | Freq: Three times a day (TID) | ORAL | Status: DC
  Administered 2023-03-02 – 2023-03-05 (×11): 2 via ORAL
  Filled 2023-03-02 (×12): qty 2

## 2023-03-02 MED ORDER — GERHARDT'S BUTT CREAM
TOPICAL_CREAM | Freq: Two times a day (BID) | CUTANEOUS | Status: DC
  Administered 2023-03-04: 1 via TOPICAL
  Filled 2023-03-02: qty 1

## 2023-03-02 MED ORDER — RIFAXIMIN 550 MG PO TABS
550.0000 mg | ORAL_TABLET | Freq: Two times a day (BID) | ORAL | Status: DC
  Administered 2023-03-02 – 2023-03-05 (×8): 550 mg via ORAL
  Filled 2023-03-02 (×8): qty 1

## 2023-03-02 MED ORDER — LACTULOSE ENEMA
300.0000 mL | Freq: Once | ORAL | Status: AC
  Administered 2023-03-02: 300 mL via RECTAL
  Filled 2023-03-02: qty 300

## 2023-03-02 MED ORDER — SODIUM CHLORIDE 0.9 % IV BOLUS
500.0000 mL | Freq: Once | INTRAVENOUS | Status: AC
  Administered 2023-03-02: 500 mL via INTRAVENOUS

## 2023-03-02 MED ORDER — INSULIN ASPART 100 UNIT/ML IJ SOLN
0.0000 [IU] | INTRAMUSCULAR | Status: DC
  Administered 2023-03-02: 2 [IU] via SUBCUTANEOUS

## 2023-03-02 MED ORDER — LACTULOSE 10 GM/15ML PO SOLN
10.0000 g | Freq: Three times a day (TID) | ORAL | Status: DC
  Administered 2023-03-02 – 2023-03-04 (×7): 10 g via ORAL
  Filled 2023-03-02 (×7): qty 15

## 2023-03-02 MED ORDER — BASAGLAR KWIKPEN 100 UNIT/ML ~~LOC~~ SOPN: 16.0000 [IU] | PEN_INJECTOR | Freq: Every day | SUBCUTANEOUS | Status: DC

## 2023-03-02 MED ORDER — SODIUM CHLORIDE 0.9 % IV SOLN: INTRAVENOUS | Status: AC

## 2023-03-02 MED ORDER — DEXTROSE 50 % IV SOLN: 12.5000 g | Freq: Once | INTRAVENOUS | Status: DC

## 2023-03-02 MED ORDER — SPIRONOLACTONE 25 MG PO TABS
100.0000 mg | ORAL_TABLET | Freq: Every day | ORAL | Status: DC
  Administered 2023-03-02: 100 mg via ORAL
  Filled 2023-03-02: qty 4

## 2023-03-02 MED ORDER — TAMSULOSIN HCL 0.4 MG PO CAPS
0.4000 mg | ORAL_CAPSULE | Freq: Every day | ORAL | Status: DC
  Administered 2023-03-02 – 2023-03-05 (×4): 0.4 mg via ORAL
  Filled 2023-03-02 (×4): qty 1

## 2023-03-02 MED ORDER — GUAIFENESIN-CODEINE 100-10 MG/5ML PO SOLN
5.0000 mL | Freq: Every day | ORAL | Status: DC
  Administered 2023-03-02 – 2023-03-04 (×3): 5 mL via ORAL
  Filled 2023-03-02 (×3): qty 5

## 2023-03-02 MED ORDER — SODIUM CHLORIDE 0.9 % IV BOLUS: 1000.0000 mL | Freq: Once | INTRAVENOUS | Status: DC

## 2023-03-02 NOTE — Progress Notes (Signed)
PROGRESS NOTE  Corey Wood:[id number] DOB: [birth year] DOA: 03/01/2023 PCP: [facility]  HPI/Recap of past 45 hours: 72 year old male with past medical history of COPD, anxiety depression, T2DM, HLD, HTN, hypothyroidism, neuromuscular disorder, OSA, chronic oxygen dependency, history of stroke, cirrhosis secondary to NASH, history of variceal bleeding, hepatic hydrothorax, portal hypertensive, gastritis intolerant to beta-blockade due to bradycardia and hypotension.  He is sp EGD with banding and TIPS. Presents to the ED due to dark stools/melena with confusion X 2 days PTA. In the ER patient noted borderline hypotension.  Vitals otherwise stable.  Patient's occult stool positive.  Sodium 129, albumin 2.5, ammonia 67, hemoglobin 10.2, previous hemoglobin 12.5 in 11/2022], platelets 92 [chronic], INR 1.3, CXR redemonstrates B/L pleural effusions L>R. GI contacted by EDP recommended Protonix and octreotide infusions.  Patient admitted for further management.    Today, patient noted to be more sleepy, easily arousable, but unable to stay awake to carry out a conversation.  Son at bedside, noted he had not slept for the past 2 nights.  Discussed plan of care with patient's son at bedside.  Patient currently denies any pain or discomfort.    Assessment/Plan: Principal Problem:   GI bleeding Active Problems:   Hepatic encephalopathy (HCC)   Liver cirrhosis secondary to NASH (HCC)   Type 2 diabetes mellitus with stage 3a chronic kidney disease, with long-term current use of insulin (HCC)   Chronic kidney disease, stage 3a (HCC)   Parkinson's disease (HCC)   Chronic obstructive pulmonary disease (HCC)   Bleeding esophageal varices (HCC)   Pancytopenia (HCC)   Portal hypertension (HCC)   S/P TIPS (transjugular intrahepatic portosystemic shunt)   GI bleed   Possible GI bleeding ??Bleeding esophageal varices FOBT positive Sp TIPs N.p.o., IV fluid hydration Serial H&H,  monitor closely Type and screen, transfuse if hgb < 7 Continue Protonix every 12, octreotide drip GI Dr. Adela Lank will see in consult  Acute on chronic anemia Hemoglobin downtrending GI on board, appreciate recs  Hypotension BP noted to be soft Continue IV fluids x 1 day, midodrine Held home Lasix, Aldactone for now  Hyponatremia Possibly 2/2 poor oral intake versus cirrhosis IV fluids x 1 day Daily CMP  Hepatic encephalopathy Ongoing, likely from possible GI bleed Negative head CT Ammonia level 67 Continue rifaximin twice daily, lactulose Daily CMP, ammonia  Pancytopenia Liver cirrhosis secondary to NASH/chronic thrombocytopenia Portal hypertension Continue midodrine 20 mg p.o. 3 times daily resumed  Held Lasix, Aldactone for now   T2DM Last A1c 6.5 Sliding scale insulin initiated every 4.   Holding lantus w/ n.p.o. status   Chronic obstructive pulmonary disease Chronic respiratory failure  On 3L of O2 at basline Nebulizers as needed.  Flonase resumed  Chronic kidney disease, stage 3a Stable at baseline  Parkinson's disease Sinemet resumed  Physical deconditioning Now mostly in the [facility] d/t weakness PT/OT  GOC discussion Palliative/hospice care consulted        Estimated body mass index is 26.31 kg/m as calculated from the following:   Height as of this encounter: 5\' 7"  (1.702 m).   Weight as of this encounter: 76.2 kg.     Code Status: Full  Family Communication: Son at bedside  Disposition Plan: Status is: Inpatient Remains inpatient appropriate because: Level of care      Consultants: GI  Procedures: None   Antimicrobials: None   DVT prophylaxis:  SCDs   Objective: Vitals:   03/02/23 0930 03/02/23 1100 03/02/23 1241 03/02/23 1415  BP: (!) 97/57 (!) 102/54 97/72 102/61  Pulse: (!) 39 79 60 (!) 48  Resp: (!) 24 (!) 22 19 (!) 21  Temp:   97.8 F (36.6 C)   TempSrc:   Axillary   SpO2: 100% 99% 100% 100%  Weight:       Height:       No intake or output data in the 24 hours ending 03/02/23 1608 Filed Weights   03/01/23 1448  Weight: 76.2 kg    Exam: General: NAD, sleepy, confused  Cardiovascular: S1, S2 present Respiratory: CTAB Abdomen: Soft, nontender, nondistended, bowel sounds present Musculoskeletal: No bilateral pedal edema noted. Multiple scabs noted on BLE Skin: As noted above Psychiatry: Unable to assess    Data Reviewed: CBC: Recent Labs  Lab 03/01/23 1536 03/01/23 2352 03/02/23 0129 03/02/23 0448 03/02/23 1240  WBC 5.3  --   --   --   --   HGB 10.2* 9.8* 8.9* 8.6* 9.3*  HCT 29.6* 27.7* 25.8* 25.3* 27.2*  MCV 101.7*  --   --   --   --   PLT 92*  --   --   --   --    Basic Metabolic Panel: Recent Labs  Lab 03/01/23 1536  NA 129*  K 4.3  CL 92*  CO2 26  GLUCOSE 190*  BUN 38*  CREATININE 1.39*  CALCIUM 10.2   GFR: Estimated Creatinine Clearance: 43.6 mL/min (A) (by C-G formula based on SCr of 1.39 mg/dL (H)). Liver Function Tests: Recent Labs  Lab 03/01/23 1536  AST 56*  ALT 12  ALKPHOS 94  BILITOT 1.1  PROT 6.2*  ALBUMIN 2.5*   Recent Labs  Lab 03/01/23 1636  LIPASE 25   Recent Labs  Lab 03/01/23 1536  AMMONIA 67*   Coagulation Profile: Recent Labs  Lab 03/01/23 1536  INR 1.3*   Cardiac Enzymes: No results for input(s): "CKTOTAL", "CKMB", "CKMBINDEX", "TROPONINI" in the last 168 hours. BNP (last 3 results) No results for input(s): "PROBNP" in the last 8760 hours. HbA1C: Recent Labs    03/02/23 0129  HGBA1C 6.5*   CBG: Recent Labs  Lab 03/02/23 0126 03/02/23 0447 03/02/23 0754 03/02/23 1254 03/02/23 1552  GLUCAP 87 105* 118* 131* 112*   Lipid Profile: No results for input(s): "CHOL", "HDL", "LDLCALC", "TRIG", "CHOLHDL", "LDLDIRECT" in the last 72 hours. Thyroid Function Tests: No results for input(s): "TSH", "T4TOTAL", "FREET4", "T3FREE", "THYROIDAB" in the last 72 hours. Anemia Panel: No results for input(s): "VITAMINB12",  "FOLATE", "FERRITIN", "TIBC", "IRON", "RETICCTPCT" in the last 72 hours. Urine analysis:    Component Value Date/Time   COLORURINE YELLOW 03/01/2023 2053   APPEARANCEUR CLEAR 03/01/2023 2053   LABSPEC 1.026 03/01/2023 2053   PHURINE 5.0 03/01/2023 2053   GLUCOSEU NEGATIVE 03/01/2023 2053   HGBUR NEGATIVE 03/01/2023 2053   BILIRUBINUR NEGATIVE 03/01/2023 2053   KETONESUR NEGATIVE 03/01/2023 2053   PROTEINUR NEGATIVE 03/01/2023 2053   NITRITE NEGATIVE 03/01/2023 2053   LEUKOCYTESUR NEGATIVE 03/01/2023 2053   Sepsis Labs: @LABRCNTIP (procalcitonin:4,lacticidven:4)  )No results found for this or any previous visit (from the past 240 hour(s)).    Studies: CT ABDOMEN PELVIS W CONTRAST  Result Date: 03/01/2023 CLINICAL DATA:  Abdominal pain. EXAM: CT ABDOMEN AND PELVIS WITH CONTRAST TECHNIQUE: Multidetector CT imaging of the abdomen and pelvis was performed using the standard protocol following bolus administration of intravenous contrast. RADIATION DOSE REDUCTION: This exam was performed according to the departmental dose-optimization program which includes automated exposure control, adjustment of the mA  and/or kV according to patient size and/or use of iterative reconstruction technique. CONTRAST:  75mL OMNIPAQUE IOHEXOL 350 MG/ML SOLN COMPARISON:  CT of the chest abdomen pelvis dated 04/01/2022. FINDINGS: Lower chest: Partially visualized small bilateral pleural effusions. There are consolidative changes of the visualized lower lobes which may represent atelectasis or infiltrate. No intra-abdominal free air or free fluid. Hepatobiliary: Cirrhosis.  No biliary dilatation.  Cholecystectomy. Pancreas: Unremarkable. No pancreatic ductal dilatation or surrounding inflammatory changes. Spleen: Splenomegaly measuring 16 cm in length. Adrenals/Urinary Tract: The adrenal glands unremarkable. Severe chronic left hydronephrosis and left renal parenchyma atrophy. There is a 5 mm nonobstructing stone in the  interpolar left kidney. There is a 6 mm stone in the distal left ureter with moderate left hydroureter. Punctate nonobstructing right renal upper pole calculus. No hydronephrosis on the right. The right ureter is unremarkable. The urinary bladder is minimally distended. There is diffuse thickening of the anterior bladder wall with mild trabeculation which may be related to chronic bladder outlet obstruction. Correlation with urinalysis recommended to exclude UTI. Stomach/Bowel: Small hiatal hernia. Mild thickened appearance of the distal esophagus may represent esophagitis related to reflux. Clinical correlation recommended. There is no bowel obstruction. The appendix is normal. Vascular/Lymphatic: Mild aortoiliac atherosclerotic disease. The IVC is unremarkable. A TIPS is noted. The SMV, splenic vein, and main portal vein appear patent. No portal venous gas. No adenopathy. Reproductive: The prostate and seminal vesicles are grossly unremarkable. No pelvic mass. Other: None Musculoskeletal: Osteopenia with degenerative changes. Age indeterminate, chronic appearing compression fracture of the inferior endplate of T12. No acute osseous pathology. IMPRESSION: 1. A 6 mm stone in the distal left ureter with moderate left hydroureter. 2. Severe chronic left hydronephrosis and left renal parenchyma atrophy. 3. Nonobstructing bilateral renal calculi. No hydronephrosis on the right. 4. Cirrhosis with splenomegaly. 5. Partially visualized small bilateral pleural effusions with bibasilar atelectasis or infiltrate. 6.  Aortic Atherosclerosis (ICD10-I70.0). Electronically Signed   By: Elgie Collard M.D.   On: 03/01/2023 20:10   CT Head Wo Contrast  Result Date: 03/01/2023 CLINICAL DATA:  Altered mental status EXAM: CT HEAD WITHOUT CONTRAST TECHNIQUE: Contiguous axial images were obtained from the base of the skull through the vertex without intravenous contrast. RADIATION DOSE REDUCTION: This exam was performed according  to the departmental dose-optimization program which includes automated exposure control, adjustment of the mA and/or kV according to patient size and/or use of iterative reconstruction technique. COMPARISON:  02/01/2023 FINDINGS: Brain: No evidence of acute infarction, hemorrhage, mass, mass effect, or midline shift. No hydrocephalus or extra-axial fluid collection. Basal ganglia calcifications. Age related cerebral atrophy. Periventricular white matter changes, likely the sequela of chronic small vessel ischemic disease. Vascular: No hyperdense vessel. Skull: Negative for fracture or focal lesion. Sinuses/Orbits: No acute finding. Status post bilateral lens replacements. Other: Trace fluid in the left mastoid air cells. IMPRESSION: No acute intracranial process. Electronically Signed   By: Wiliam Ke M.D.   On: 03/01/2023 19:21    Scheduled Meds:  carbidopa-levodopa  2 tablet Oral TID WC   Gerhardt's butt cream   Topical BID   guaiFENesin-codeine  5 mL Oral QHS   insulin aspart  0-15 Units Subcutaneous Q4H   lactulose  10 g Oral TID   midodrine  20 mg Oral TID WC   pantoprazole (PROTONIX) IV  40 mg Intravenous Q12H   rifaximin  550 mg Oral BID   tamsulosin  0.4 mg Oral Daily    Continuous Infusions:  sodium chloride 100 mL/hr  at 03/02/23 0939   octreotide (SANDOSTATIN) 500 mcg in sodium chloride 0.9 % 250 mL (2 mcg/mL) infusion 50 mcg/hr (03/02/23 1559)     LOS: 0 days     Briant Cedar, MD [facility]  If 7PM-7AM, please contact night-coverage [email or web address] 03/02/2023, 4:08 PM

## 2023-03-02 NOTE — Progress Notes (Signed)
Pt arrived from ..ED..., A/ox .1.[name] Kitchenpt denies any pain, MD aware,CCMD called. CHG bath given,no further needs at this time

## 2023-03-02 NOTE — ED Notes (Signed)
Changed patient  

## 2023-03-02 NOTE — Consult Note (Signed)
WOC Nurse Consult Note: Reason for Consult: Consult requested for buttocks.  Pt is frequently incontinent of urine and stool and states he spends most of his time in a recliner prior to admission.  Skin across bilat buttocks is red, moist and macerated with patchy areas of partial thickness skin loss.  Appearance is consistent with moisture associated skin damage and chronic tissue damage, NOT a pressure injury.  Largest open area is to left buttock, 2X2X.2cm, red and moist full thickness wound. Dressings would become soiled related to incontinence and it is best to leave open to air.   Dressing procedure/placement/frequency: Topical treatment orders provided for bedside nurses to perform as follows to repel moisture and promote healing: Apply Gerhardts cream to bilat buttocks BID and PRN when turning and cleaning. Please re-consult if further assistance is needed.  Thank-you,  Cammie Mcgee MSN, RN, CWOCN, [place], CNS [phone]

## 2023-03-02 NOTE — ED Notes (Signed)
PT easily aroused to take medications

## 2023-03-02 NOTE — Consult Note (Signed)
Referring Provider: Dr. Virgina Norfolk  Primary Care Physician:  [facility] Primary Gastroenterologist:  Dr. Ileene Patrick   Reason for Consultation: Cirrhosis, dark stools, hepatic encephalopathy  HPI: Corey Wood is a 72 y.o. 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   He developed dark stools Saturday 02/26/2023 with generalized weakness which progressively worsened therefore he presented to the ED 03/01/2023 for further evaluation.  Labs in the ED showed a WBC count of 5.3.  Hemoglobin 10.2 down from 12 two months ago.  Hematocrit 29.6.  MCV 101.7.  Platelet 92.  CTAP 12/3 identified cirrhosis with splenomegaly, a 6 mm stone in the distal ureter, severe chronic left hydronephrosis, nonobstructing bilateral renal calculi and small bilateral pleural effusions.  Head CT was negative.  Patiently is currently confused therefore his son who is at the bedside provided the above history and symptom update.  His son stated his father had intermittent confusion which has worsened over the past few days.  Father also having difficulty with sleeping at night.  Decreased appetite.  The patient's spouse indicated he was passing dark black stools at home for the past 2 to 3 days.  His son stated his father takes all medications as prescribed.  On Xifaxan 550 mg twice daily, furosemide 20mg  daily, PPI twice daily and sulfate 325 1 g 1 tab 3 days weekly.  Patient denies having any chest pain or shortness of breath.  No  nausea or vomiting.  No dysphagia or heartburn.  No abdominal pain.  No alcohol use.  No tobacco use.   MELD 3.0: 20 at 03/01/2023  3:36 PM MELD-Na: 20 at 03/01/2023  3:36 PM Calculated from: Serum Creatinine: 1.39 mg/dL at 30/10/6576  4:69 PM Serum Sodium: 129 mmol/L at 03/01/2023  3:36 PM Total Bilirubin: 1.1 mg/dL at 62/11/5282  1:32 PM Serum Albumin: 2.5 g/dL at 44/0/1027  2:53 PM INR(ratio): 1.3 at 03/01/2023  3:36 PM Age at listing (hypothetical): 33 years Sex: Male at 03/01/2023  3:36 PM  GI PROCEDURES:  EGD 01/30/20 - Esophagogastric landmarks identified. - Large esophageal varices as described. - Erythematous mucosa in the gastric fundus and antrum, suspect portal hypertensive gastritis. Biopsies taken to rule out H pylori. - Suspected small type 2 gastroesophageal varices (GOV2, esophageal varices which extend along the fundus). - Normal duodenal bulb and second portion of the duodenum, very mild superficial erythema Noted.   Colonoscopy 01/30/20 - The perianal and digital rectal examinations were normal. - The terminal ileum appeared normal. - A 5 to 6 mm polyp was found in the transverse colon. The polyp was flat. The polyp was removed with a cold snare. Resection and retrieval were complete. - Moderately congested mucosa was found in the entire colon, suspected due to portal hypertension. - Internal hemorrhoids were found during retroflexion. - The exam was otherwise without abnormality.   1. Surgical [P], gastric antrum and gastric body - CHRONIC GASTRITIS. - WARTHIN-STARRY IS NEGATIVE FOR HELICOBACTER PYLORI. - NO INTESTINAL METAPLASIA, DYSPLASIA, OR MALIGNANCY. 2. Surgical [P], colon, transverse, polyp - TUBULAR ADENOMA. - NO HIGH GRADE DYSPLASIA OR MALIGNANCY.     EGD 04/17/20 - Esophagogastric  landmarks identified. - Large esophageal varices. Banded x 11. - Type 2 gastroesophageal varices (GOV2, esophageal varices which extend along the fundus). - Erythematous  mucosa in the antrum. - Normal duodenal bulb and second portion of the duodenum.   EGD 04/25/20 - Large (> 5 mm) esophageal varices. - Esophageal ulcers with no stigmata of recent bleeding. - Portal hypertensive gastropathy. - A medium amount of food (residue) in the stomach. - Normal examined duodenum. - No specimens collected.   EGD 04/30/20 - No gross lesions in esophagus proximally. - Grade I, grade II and grade III esophageal varices as well as multiple post-banding ulcers with a few ulcers showing active oozing. Near completely eradicated after 6 bands were placed. No active extravasation noted thereafter. - Clotted blood in the entire stomach - suctioned and lavaged with mild clearance and did not see active re-accumulation of bleeding. - Type 2 gastroesophageal varices (GOV2, esophageal varices which extend along the fundus) - not completely visualized as had been at time of first endoscopy but no sing of active bleeding, but recent stigmata or nipple sign could have been missed due to blood in fundus. - Blood in the duodenal bulb and in the second portion of the duodenum - lavaged away.   Echo 05/01/20 - EF 60-65%   EGD 06/10/20 - Esophagogastric landmarks identified. - Large esophageal varices but improved overall in size / appearance compared to initial exam. Banded x 6 in the lower to mid esophagus. - Type 2 gastroesophageal varices (GOV2, esophageal varices which extend along the fundus). - Portal hypertensive gastropathy. - Normal duodenal bulb and second portion of the duodenum.   Patient had a CVA in April, follow up EGD was cancelled   Admitted to [facility] 5/21/08/30/20 for volume overload   EGD 11/10/20 - Esophagogastric landmarks identified. - Esophageal varices as described above - improvement in size over time but columns still persist, mostly medium in size, one large column. Banded x 6. - Portal hypertensive gastropathy. - Normal duodenal bulb and second  portion of the duodenum.     EGD 02/10/21 - Grade II and small (< 5 mm) esophageal varices with no bleeding and no stigmata of recent bleeding. Banded x 3 today with good treatment response. Overall excellent response to EVL with now small varices. - Portal hypertensive gastropathy. No gastric varices seen. - Normal examined duodenum.   RUQ [place] 09/23/20 - IMPRESSION: Cirrhotic liver morphology.  No focal hepatic lesion.     EGD 03/31/21 - retained food in the stomach   EGD 04/21/21 - Dr. Christella Hartigan Three trunks of Grade II varices were found in the distal esophagus with scar evidence of previous banding procedures. Six bands were successfully placed with complete eradication, resulting in deflation of varices. There was no bleeding during the procedure. Mild portal gastropathy changes throughout the stomach. No gastric varices   RUQ 02/27/21: IMPRESSION: 1. Cirrhotic morphology liver.  No sonographic evidence of hepatoma. 2. Common bile duct dilation, likely post cholecystectomy distention.    EGD 10/15/21: Esophagogastric landmarks identified. - Grade I esophageal varices with flattening - extensive scarring from prior banding, improved. No further banding performed today - Portal hypertensive gastropathy. - Normal duodenal bulb and second portion of the duodenum.    EGD 07/16/21: One column of grade II varices with no bleeding and no stigmata of recent bleeding were found in the distal esophagus,. They were 6 mm in largest diameter. No red wale signs were present. Scarring from prior treatment was visible. The varices  appeared smaller than they were at prior exam. Two bands were successfully placed with complete eradication, resulting in deflation of varices. There was no bleeding during and at the end of the procedure.   The exam of the esophagus was otherwise normal. Moderate portal hypertensive gastropathy was found in the entire examined stomach. No gastric varices. The exam of  the stomach was otherwise normal. The duodenal bulb and second portion of the duodenum were normal.     EGD 01/18/22: Grade II and large (> 5 mm) esophageal varices. Banded. x 4 some bleeding after banding of smaller varyx with nipple sign - it stopped. - Gastritis. - Portal hypertensive gastropathy. - The examination was otherwise normal. - No specimens collected.  PAST RADIOLOGY STUDIES:   S/p thoracentesis 12/27/2022, pleural fluid cytology negative for malignant cells.   Echocardiogram 01/19/22: EF 55-60%    US abdomen 01/19/22: IMPRESSION: 1. Stable findings of cirrhosis.  No focal liver abnormality. 2. Splenomegaly, compatible with portal venous hypertension. 3. Small amount of loculated ascites left upper quadrant. 4. Incidental small bilateral pleural effusions.    CT chest / abdomen / pelvis 04/01/22: ABDOMEN/PELVIS: 1. No acute abnormality in the abdomen or pelvis. 2. Interval TIPS creation, which appears patent. 3. Chronic left hydroureteronephrosis with atrophic parenchyma. Similar dependent 2 mm stone in the distal left ureter. 4. Unchanged splenomegaly. 5. Aortic Atherosclerosis     TIPS [place] 07/31/22: IMPRESSION: Widely patent TIPS shunt with no evidence to suggest stenosis or thrombus.   RUQ [place] 09/27/22: IMPRESSION: Cirrhotic morphology of the liver. No definite hepatic lesion identified.    Past Medical History:  Diagnosis Date   Allergy    seasonal allergies   Anxiety    on meds   Aortic valve disorder    Arthritis    generalized (fingers)(shoulders)   Asthma    uses inhaler   Cataract    bilateral -sx    Cirrhosis (HCC)    CKD (chronic kidney disease), stage III (HCC)    only has one kidney   COPD (chronic obstructive pulmonary disease) (HCC)    Depression    on meds   DM (diabetes mellitus) (HCC)    Type II   Dyspnea    GERD (gastroesophageal reflux disease)    on meds   Heart murmur    History of colon polyps    History of COVID-19  10/14/2020   History of kidney stones    Hx of acute pancreatitis 10/2019   Hyperlipidemia    on meds   Hypertension    on meds   Hypothyroidism    not on meds at this time   Myelodysplastic syndrome (HCC)    Neuromuscular disorder (HCC)    per pt   Obstructive sleep apnea    Oxygen deficiency    Parkinsonism (HCC)    per pt report   Peripheral edema    bilateral legs   Peripheral positional vertigo    Skin cancer of anterior chest    Sleep apnea    No CPAP   Stroke [facility])     Past Surgical History:  Procedure Laterality Date   ANKLE SURGERY     CHOLECYSTECTOMY     ENDARTERECTOMY Left 04/29/2020   Procedure: CAROTID EXPLORATION removal of  central venous catheter;  Surgeon: Larina Earthly, MD;  Location[facility];  Service: Vascular;  Laterality: Left;   ESOPHAGEAL BANDING N/A 04/17/2020   Procedure: ESOPHAGEAL BANDING;  Surgeon: Benancio Deeds, MD;  Location: [facility] ENDOSCOPY;  Service: Gastroenterology;  Laterality: N/A;   ESOPHAGEAL BANDING  04/29/2020   Procedure: ESOPHAGEAL BANDING;  Surgeon: Meridee Score Netty Starring., MD;  Location: [facility] ENDOSCOPY;  Service: Gastroenterology;;   ESOPHAGEAL BANDING N/A 06/10/2020   Procedure: ESOPHAGEAL BANDING;  Surgeon: Benancio Deeds, MD;  Location: [facility] ENDOSCOPY;  Service: Gastroenterology;  Laterality: N/A;   ESOPHAGEAL BANDING N/A 11/10/2020   Procedure: ESOPHAGEAL BANDING;  Surgeon: Benancio Deeds, MD;  Location: [facility] ENDOSCOPY;  Service: Gastroenterology;  Laterality: N/A;   ESOPHAGEAL BANDING N/A 02/10/2021   Procedure: ESOPHAGEAL BANDING;  Surgeon: Beverley Fiedler, MD;  Location: [facility] ENDOSCOPY;  Service: Gastroenterology;  Laterality: N/A;   ESOPHAGEAL BANDING N/A 04/21/2021   Procedure: ESOPHAGEAL BANDING;  Surgeon: Rachael Fee, MD;  Location: [facility] ENDOSCOPY;  Service: Endoscopy;  Laterality: N/A;   ESOPHAGEAL BANDING N/A 07/16/2021   Procedure: ESOPHAGEAL BANDING;  Surgeon: Meryl Dare, MD;  Location: [facility] ENDOSCOPY;  Service:  Gastroenterology;  Laterality: N/A;   ESOPHAGEAL BANDING  01/18/2022   Procedure: ESOPHAGEAL BANDING;  Surgeon: Iva Boop, MD;  Location: [facility] ENDOSCOPY;  Service: Gastroenterology;;   ESOPHAGOGASTRODUODENOSCOPY N/A 04/25/2020   Procedure: ESOPHAGOGASTRODUODENOSCOPY (EGD);  Surgeon: Jeani Hawking, MD;  Location: [facility] ENDOSCOPY;  Service: Endoscopy;  Laterality: N/A;   ESOPHAGOGASTRODUODENOSCOPY (EGD) WITH PROPOFOL N/A 04/17/2020   Procedure: ESOPHAGOGASTRODUODENOSCOPY (EGD) WITH PROPOFOL;  Surgeon: Benancio Deeds, MD;  Location: [facility] ENDOSCOPY;  Service: Gastroenterology;  Laterality: N/A;   ESOPHAGOGASTRODUODENOSCOPY (EGD) WITH PROPOFOL N/A 04/29/2020   Procedure: ESOPHAGOGASTRODUODENOSCOPY (EGD) WITH PROPOFOL;  Surgeon: Meridee Score Netty Starring., MD;  Location: [facility] ENDOSCOPY;  Service: Gastroenterology;  Laterality: N/A;   ESOPHAGOGASTRODUODENOSCOPY (EGD) WITH PROPOFOL N/A 06/10/2020   Procedure: ESOPHAGOGASTRODUODENOSCOPY (EGD) WITH PROPOFOL;  Surgeon: Benancio Deeds, MD;  Location: [facility] ENDOSCOPY;  Service: Gastroenterology;  Laterality: N/A;   ESOPHAGOGASTRODUODENOSCOPY (EGD) WITH PROPOFOL N/A 11/10/2020   Procedure: ESOPHAGOGASTRODUODENOSCOPY (EGD) WITH PROPOFOL;  Surgeon: Benancio Deeds, MD;  Location: [facility] ENDOSCOPY;  Service: Gastroenterology;  Laterality: N/A;   ESOPHAGOGASTRODUODENOSCOPY (EGD) WITH PROPOFOL N/A 02/10/2021   Procedure: ESOPHAGOGASTRODUODENOSCOPY (EGD) WITH PROPOFOL;  Surgeon: Beverley Fiedler, MD;  Location: [facility] ENDOSCOPY;  Service: Gastroenterology;  Laterality: N/A;   ESOPHAGOGASTRODUODENOSCOPY (EGD) WITH PROPOFOL N/A 03/31/2021   Procedure: ESOPHAGOGASTRODUODENOSCOPY (EGD) WITH PROPOFOL;  Surgeon: Benancio Deeds, MD;  Location: [facility] ENDOSCOPY;  Service: Gastroenterology;  Laterality: N/A;   ESOPHAGOGASTRODUODENOSCOPY (EGD) WITH PROPOFOL N/A 04/21/2021   Procedure: ESOPHAGOGASTRODUODENOSCOPY (EGD) WITH PROPOFOL;  Surgeon: Rachael Fee, MD;  Location: [facility] ENDOSCOPY;   Service: Endoscopy;  Laterality: N/A;   ESOPHAGOGASTRODUODENOSCOPY (EGD) WITH PROPOFOL N/A 07/16/2021   Procedure: ESOPHAGOGASTRODUODENOSCOPY (EGD) WITH PROPOFOL;  Surgeon: Meryl Dare, MD;  Location: [facility] ENDOSCOPY;  Service: Gastroenterology;  Laterality: N/A;   ESOPHAGOGASTRODUODENOSCOPY (EGD) WITH PROPOFOL N/A 10/15/2021   Procedure: ESOPHAGOGASTRODUODENOSCOPY (EGD) WITH PROPOFOL;  Surgeon: Benancio Deeds, MD;  Location: [facility] ENDOSCOPY;  Service: Gastroenterology;  Laterality: N/A;   ESOPHAGOGASTRODUODENOSCOPY (EGD) WITH PROPOFOL N/A 01/18/2022   Procedure: ESOPHAGOGASTRODUODENOSCOPY (EGD) WITH PROPOFOL;  Surgeon: Iva Boop, MD;  Location: [facility] ENDOSCOPY;  Service: Gastroenterology;  Laterality: N/A;   EYE SURGERY     HERNIA REPAIR     IR INTRAVASCULAR ULTRASOUND NON CORONARY  03/26/2022   IR PARACENTESIS  09/18/2020   IR RADIOLOGIST EVAL & MGMT  10/22/2021   IR THORACENTESIS ASP PLEURAL SPACE W/IMG GUIDE  10/05/2021   IR THORACENTESIS ASP PLEURAL SPACE W/IMG GUIDE  12/27/2022   IR TIPS  03/26/2022   IR US GUIDE VASC ACCESS RIGHT  03/26/2022  KIDNEY STONE SURGERY     RADIOLOGY WITH ANESTHESIA N/A 03/26/2022   Procedure: TIPS;  Surgeon: Berdine Dance, MD;  Location[facility] OR;  Service: Radiology;  Laterality: N/A;   WISDOM TOOTH EXTRACTION      Prior to Admission medications   Medication Sig Start Date End Date Taking? Authorizing Provider  acetaminophen (TYLENOL) 325 MG tablet Take 325 mg by mouth every 6 (six) hours as needed for moderate pain.   Yes [provider]  albuterol (VENTOLIN HFA) 108 (90 Base) MCG/ACT inhaler Inhale 1 puff into the lungs every 6 (six) hours as needed for wheezing or shortness of breath.   Yes [provider]  carbidopa-levodopa (SINEMET IR) 25-100 MG tablet Take 2 tablets by mouth in the morning, at noon, and at bedtime. 06/30/21  Yes [provider]  DULoxetine (CYMBALTA) 20 MG capsule Take 20 mg by mouth daily.   Yes  [provider]  ferrous sulfate 325 (65 FE) MG EC tablet Take 325 mg by mouth 3 (three) times a week. MWF   Yes [provider]  fexofenadine (ALLEGRA) 180 MG tablet Take by mouth. 10/04/22  Yes [provider]  fluticasone (FLONASE) 50 MCG/ACT nasal spray Place 2 sprays into both nostrils at bedtime.   Yes [provider]  furosemide (LASIX) 20 MG tablet Take 20 mg by mouth daily.   Yes [provider]  glucose 4 GM chewable tablet Chew 4 tablets by mouth as needed for low blood sugar.   Yes [provider]  guaiFENesin (DIABETIC TUSSIN EX) 100 MG/5ML liquid Take 10 mLs by mouth at bedtime. Use with guaifenesin-codeine at bedtime   Yes [provider]  guaiFENesin-codeine 100-10 MG/5ML syrup Take 5 mLs by mouth at bedtime. Use with diabetic tussin 11/09/22  Yes [provider]  insulin aspart (NOVOLOG FLEXPEN) 100 UNIT/ML FlexPen Inject 3 Units into the skin as needed for high blood sugar. 12/23/22  Yes [provider]  Insulin Glargine (BASAGLAR KWIKPEN) 100 UNIT/ML 16 Units  in the morning.   Yes [provider]  lactulose (CONSTULOSE) 10 GM/15ML solution Take 15 mLs (10 g total) by mouth 3 (three) times daily. Take 45 mls three times daily. Patient taking differently: Take 30 g by mouth 4 (four) times daily. 04/23/22  Yes Armbruster, Willaim Rayas, MD  latanoprost (XALATAN) 0.005 % ophthalmic solution Place 1 drop into both eyes at bedtime. 08/20/20  Yes [provider]  liver oil-zinc oxide (BOUDREAUXS BUTT PASTE) 40 % ointment Apply topically.   Yes [provider]  midodrine (PROAMATINE) 10 MG tablet Take 20 mg by mouth 3 (three) times daily. 10/21/21  Yes Armbruster, Willaim Rayas, MD  montelukast (SINGULAIR) 10 MG tablet Take 10 mg by mouth at bedtime. 05/22/20  Yes [provider]  Multiple Vitamin (MULTIVITAMIN WITH MINERALS) TABS tablet Take 1 tablet by mouth daily.   Yes [provider]  OXYGEN Inhale 3 L into the lungs daily.   Yes [provider]  pantoprazole (PROTONIX) 40 MG tablet Take 40 mg by mouth daily with breakfast.   Yes [provider]  rifaximin (XIFAXAN) 550 MG TABS tablet Take 1 tablet (550 mg total) by mouth 2 (two) times daily. 04/03/22  Yes Mansouraty, Netty Starring., MD  spironolactone (ALDACTONE) 100 MG tablet Take 1 tablet by mouth daily. 12/05/22  Yes [provider]  tamsulosin (FLOMAX) 0.4 MG CAPS capsule Take 0.4 mg by mouth in the morning and at bedtime.   Yes  [provider]  traZODone (DESYREL) 150 MG tablet Take 75 mg by mouth at bedtime as needed for sleep. 09/28/19  Yes [provider]  Vitamins A & D (VITAMIN A & D) ointment Apply 1 Application topically as needed for dry skin (Use on buttocks).   Yes [provider]    Current Facility-Administered Medications  Medication Dose Route Frequency Provider Last Rate Last Admin   0.9 %  sodium chloride infusion   Intravenous Continuous Joneen Roach, Debby, MD 50 mL/hr at 03/02/23 0248 New Bag at 03/02/23 0248   carbidopa-levodopa (SINEMET IR) 25-100 MG per tablet immediate release 2 tablet  2 tablet Oral TID WC Crosley, Debby, MD       guaiFENesin-codeine 100-10 MG/5ML solution 5 mL  5 mL Oral QHS Crosley, Debby, MD       insulin aspart (novoLOG) injection 0-15 Units  0-15 Units Subcutaneous Q4H Crosley, Debby, MD       lactulose (CHRONULAC) 10 GM/15ML solution 10 g  10 g Oral TID Crosley, Debby, MD       midodrine (PROAMATINE) tablet 20 mg  20 mg Oral TID WC Crosley, Debby, MD       octreotide (SANDOSTATIN) 500 mcg in sodium chloride 0.9 % 250 mL (2 mcg/mL) infusion  50 mcg/hr Intravenous Continuous Curatolo, Adam, DO 25 mL/hr at 03/02/23 0448 50 mcg/hr at 03/02/23 0448   pantoprazole (PROTONIX) injection 40 mg  40 mg Intravenous Q12H Curatolo, Adam, DO   40 mg at 03/02/23 0604   rifaximin (XIFAXAN) tablet 550 mg  550 mg Oral BID Gery Pray,  MD   550 mg at 03/02/23 0246   spironolactone (ALDACTONE) tablet 100 mg  100 mg Oral Daily Crosley, Debby, MD       tamsulosin (FLOMAX) capsule 0.4 mg  0.4 mg Oral Daily Crosley, Debby, MD       Current Outpatient Medications  Medication Sig Dispense Refill   acetaminophen (TYLENOL) 325 MG tablet Take 325 mg by mouth every 6 (six) hours as needed for moderate pain.     albuterol (VENTOLIN HFA) 108 (90 Base) MCG/ACT inhaler Inhale 1 puff into the lungs every 6 (six) hours as needed for wheezing or shortness of breath.     carbidopa-levodopa (SINEMET IR) 25-100 MG tablet Take 2 tablets by mouth in the morning, at noon, and at bedtime.     DULoxetine (CYMBALTA) 20 MG capsule Take 20 mg by mouth daily.     ferrous sulfate 325 (65 FE) MG EC tablet Take 325 mg by mouth 3 (three) times a week. MWF     fexofenadine (ALLEGRA) 180 MG tablet Take by mouth.     fluticasone (FLONASE) 50 MCG/ACT nasal spray Place 2 sprays into both nostrils at bedtime.     furosemide (LASIX) 20 MG tablet Take 20 mg by mouth daily.     glucose 4 GM chewable tablet Chew 4 tablets by mouth as needed for low blood sugar.     guaiFENesin (DIABETIC TUSSIN EX) 100 MG/5ML liquid Take 10 mLs by mouth at bedtime. Use with guaifenesin-codeine at bedtime     guaiFENesin-codeine 100-10 MG/5ML syrup Take 5 mLs by mouth at bedtime. Use with diabetic tussin     insulin aspart (NOVOLOG FLEXPEN) 100 UNIT/ML FlexPen Inject 3 Units into the skin as needed for high blood sugar.     Insulin Glargine (BASAGLAR KWIKPEN) 100 UNIT/ML 16 Units  in the morning.     lactulose (CONSTULOSE) 10 GM/15ML solution Take 15 mLs (10  g total) by mouth 3 (three) times daily. Take 45 mls three times daily. (Patient taking differently: Take 30 g by mouth 4 (four) times daily.) 946 mL 2   latanoprost (XALATAN) 0.005 % ophthalmic solution Place 1 drop into both eyes at bedtime.     liver oil-zinc oxide (BOUDREAUXS BUTT PASTE) 40 % ointment Apply topically.      midodrine (PROAMATINE) 10 MG tablet Take 20 mg by mouth 3 (three) times daily.     montelukast (SINGULAIR) 10 MG tablet Take 10 mg by mouth at bedtime.     Multiple Vitamin (MULTIVITAMIN WITH MINERALS) TABS tablet Take 1 tablet by mouth daily.     OXYGEN Inhale 3 L into the lungs daily.     pantoprazole (PROTONIX) 40 MG tablet Take 40 mg by mouth daily with breakfast.     rifaximin (XIFAXAN) 550 MG TABS tablet Take 1 tablet (550 mg total) by mouth 2 (two) times daily. 60 tablet 6   spironolactone (ALDACTONE) 100 MG tablet Take 1 tablet by mouth daily.     tamsulosin (FLOMAX) 0.4 MG CAPS capsule Take 0.4 mg by mouth in the morning and at bedtime.     traZODone (DESYREL) 150 MG tablet Take 75 mg by mouth at bedtime as needed for sleep.     Vitamins A & D (VITAMIN A & D) ointment Apply 1 Application topically as needed for dry skin (Use on buttocks).      Allergies as of 03/01/2023 - Review Complete 03/01/2023  Allergen Reaction Noted   Lisinopril Cough 07/12/2012    Family History  Problem Relation Age of Onset   Liver cancer Mother    Colon cancer Neg Hx    Stomach cancer Neg Hx    Colon polyps Neg Hx    Esophageal cancer Neg Hx    Rectal cancer Neg Hx     Social History   Socioeconomic History   Marital status: Married    Spouse name: Not on file   Number of children: Not on file   Years of education: Not on file   Highest education level: Not on file  Occupational History   Occupation: retired  Tobacco Use   Smoking status: Never    Passive exposure: Never   Smokeless tobacco: Never  Vaping Use   Vaping status: Never Used  Substance and Sexual Activity   Alcohol use: Yes    Alcohol/week: 1.0 standard drink of alcohol    Types: 1 Cans of beer per week    Comment: rarely   Drug use: Never   Sexual activity: Not Currently  Other Topics Concern   Not on file  Social History Narrative   Not on file   Social Determinants of Health   Financial Resource Strain: Not  on file  Food Insecurity: Low Risk  (06/07/2022)   [facility]   Hunger Vital Sign    Worried About Running Out of Food in the Last Year: Never true    Ran Out of Food in the Last Year: Never true  Transportation Needs: Not on file (06/07/2022)  Physical Activity: Not on file  Stress: Not on file  Social Connections: Unknown (08/10/2021)   [facility]   Social Network    Social Network: Not on file  Intimate Partner Violence: Not At Risk (04/02/2022)   Humiliation, Afraid, Rape, and Kick questionnaire    Fear of Current or Ex-Partner: No    Emotionally Abused: No  Physically Abused: No    Sexually Abused: No    Review of Systems: Limited review of systems as patient has confusion, son provided brief symptom update.  See HPI.  Physical Exam: Vital signs in last 24 hours: Temp:  [97.9 F (36.6 C)-98.4 F (36.9 C)] 97.9 F (36.6 C) (12/04 0430) Pulse Rate:  [41-99] 83 (12/04 0630) Resp:  [18-26] 20 (12/04 0630) BP: (85-128)/(43-113) 105/57 (12/04 0630) SpO2:  [94 %-100 %] 98 % (12/04 0630) Weight:  [76.2 kg] 76.2 kg (12/03 1448)   General: Chronically ill-appearing 72 year old male in no acute distress. Head:  Normocephalic and atraumatic. Eyes:  No scleral icterus. Conjunctiva pink. Ears:  Normal auditory acuity. Nose:  No deformity, discharge or lesions. Mouth: Poor dentition.  Dry mouth.  No ulcers or lesions.  Neck:  Supple. No lymphadenopathy or thyromegaly.  Lungs: Breath sounds clear with bibasilar crackles.  No wheezes or rhonchi. Heart: Regular  rate and rhythm.  Systolic murmur. Abdomen: Soft, nondistended.  Nontender.  No obvious ascites.  Positive bowel sounds to all 4 quadrants.  No bruit.  No palpable mass. Rectal: FOBT positive.  Stool reported as brown by Dr. Lockie Mola. Diaper with urine, no stool. Musculoskeletal:  Symmetrical without gross deformities.  Pulses:  Normal pulses noted. Extremities:   Without clubbing or edema. Neurologic:  Alert to name. Speech is softly spoken.  Unable to perform asterixis exam secondary to upper extremity weakness. Skin: Scattered patches of ecchymosis and skin tears to the right and left pretibial areas. Psych: Cooperative, not agitated.  Intake/Output from previous day: No intake/output data recorded. Intake/Output this shift: No intake/output data recorded.  Lab Results: Recent Labs    03/01/23 1536 03/01/23 2352 03/02/23 0129 03/02/23 0448  WBC 5.3  --   --   --   HGB 10.2* 9.8* 8.9* 8.6*  HCT 29.6* 27.7* 25.8* 25.3*  PLT 92*  --   --   --    BMET Recent Labs    03/01/23 1536  NA 129*  K 4.3  CL 92*  CO2 26  GLUCOSE 190*  BUN 38*  CREATININE 1.39*  CALCIUM 10.2   LFT Recent Labs    03/01/23 1536  PROT 6.2*  ALBUMIN 2.5*  AST 56*  ALT 12  ALKPHOS 94  BILITOT 1.1   PT/INR Recent Labs    03/01/23 1536  LABPROT 16.0*  INR 1.3*   Hepatitis Panel No results for input(s): "HEPBSAG", "HCVAB", "HEPAIGM", "HEPBIGM" in the last 72 hours.    Studies/Results: CT ABDOMEN PELVIS W CONTRAST  Result Date: 03/01/2023 CLINICAL DATA:  Abdominal pain. EXAM: CT ABDOMEN AND PELVIS WITH CONTRAST TECHNIQUE: Multidetector CT imaging of the abdomen and pelvis was performed using the standard protocol following bolus administration of intravenous contrast. RADIATION DOSE REDUCTION: This exam was performed according to the departmental dose-optimization program which includes automated exposure control, adjustment of the mA and/or kV according to patient size and/or use of iterative reconstruction technique. CONTRAST:  75mL OMNIPAQUE IOHEXOL 350 MG/ML SOLN COMPARISON:  CT of the chest abdomen pelvis dated 04/01/2022. FINDINGS: Lower chest: Partially visualized small bilateral pleural effusions. There are consolidative changes of the visualized lower lobes which may represent atelectasis or infiltrate. No intra-abdominal free air or free  fluid. Hepatobiliary: Cirrhosis.  No biliary dilatation.  Cholecystectomy. Pancreas: Unremarkable. No pancreatic ductal dilatation or surrounding inflammatory changes. Spleen: Splenomegaly measuring 16 cm in length. Adrenals/Urinary Tract: The adrenal glands unremarkable. Severe chronic left hydronephrosis and left renal parenchyma atrophy. There is a  5 mm nonobstructing stone in the interpolar left kidney. There is a 6 mm stone in the distal left ureter with moderate left hydroureter. Punctate nonobstructing right renal upper pole calculus. No hydronephrosis on the right. The right ureter is unremarkable. The urinary bladder is minimally distended. There is diffuse thickening of the anterior bladder wall with mild trabeculation which may be related to chronic bladder outlet obstruction. Correlation with urinalysis recommended to exclude UTI. Stomach/Bowel: Small hiatal hernia. Mild thickened appearance of the distal esophagus may represent esophagitis related to reflux. Clinical correlation recommended. There is no bowel obstruction. The appendix is normal. Vascular/Lymphatic: Mild aortoiliac atherosclerotic disease. The IVC is unremarkable. A TIPS is noted. The SMV, splenic vein, and main portal vein appear patent. No portal venous gas. No adenopathy. Reproductive: The prostate and seminal vesicles are grossly unremarkable. No pelvic mass. Other: None Musculoskeletal: Osteopenia with degenerative changes. Age indeterminate, chronic appearing compression fracture of the inferior endplate of T12. No acute osseous pathology. IMPRESSION: 1. A 6 mm stone in the distal left ureter with moderate left hydroureter. 2. Severe chronic left hydronephrosis and left renal parenchyma atrophy. 3. Nonobstructing bilateral renal calculi. No hydronephrosis on the right. 4. Cirrhosis with splenomegaly. 5. Partially visualized small bilateral pleural effusions with bibasilar atelectasis or infiltrate. 6.  Aortic Atherosclerosis  (ICD10-I70.0). Electronically Signed   By: Elgie Collard M.D.   On: 03/01/2023 20:10   CT Head Wo Contrast  Result Date: 03/01/2023 CLINICAL DATA:  Altered mental status EXAM: CT HEAD WITHOUT CONTRAST TECHNIQUE: Contiguous axial images were obtained from the base of the skull through the vertex without intravenous contrast. RADIATION DOSE REDUCTION: This exam was performed according to the departmental dose-optimization program which includes automated exposure control, adjustment of the mA and/or kV according to patient size and/or use of iterative reconstruction technique. COMPARISON:  02/01/2023 FINDINGS: Brain: No evidence of acute infarction, hemorrhage, mass, mass effect, or midline shift. No hydrocephalus or extra-axial fluid collection. Basal ganglia calcifications. Age related cerebral atrophy. Periventricular white matter changes, likely the sequela of chronic small vessel ischemic disease. Vascular: No hyperdense vessel. Skull: Negative for fracture or focal lesion. Sinuses/Orbits: No acute finding. Status post bilateral lens replacements. Other: Trace fluid in the left mastoid air cells. IMPRESSION: No acute intracranial process. Electronically Signed   By: Wiliam Ke M.D.   On: 03/01/2023 19:21   DG Chest Portable 1 View  Result Date: 03/01/2023 CLINICAL DATA:  Weakness.  Altered mental status. EXAM: PORTABLE CHEST 1 VIEW COMPARISON:  01/12/2023. FINDINGS: Redemonstration of small-to-moderate left pleural effusion with associated compressive atelectatic changes in the left lung. There is trace right pleural effusion, slightly decreased since the prior study. Bilateral lung fields are otherwise clear. No acute consolidation or lung collapse. No pneumothorax. No pulmonary edema. Stable cardio-mediastinal silhouette. No acute osseous abnormalities. The soft tissues are within normal limits. IMPRESSION: *Redemonstration of bilateral pleural effusions, left-greater-than-right. No acute  consolidation or lung collapse. Electronically Signed   By: Jules Schick M.D.   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  Admission hemoglobin 10.2.  On Octreotide infusion and PPI IV twice daily. CTAP 12/3 identified cirrhosis with splenomegaly.  MELD 3.0: 20. Negative head CT. Ammonia level  67.  -NPO -H&H every 6 hours x 24 hours  -Transfuse for hemoglobin less than 7 -Continue Octreotide infusion -Continue PPI IV twice daily -Continue Rifaximin 550 mg 1 p.o. twice daily and lactulose 10 g p.o. 3 times daily, titrate to no more than 3-4 loose bowel movements daily -Defer endoscopic recommendations to Dr. Adela Lank -CBC, BMP, hepatic panel, ammonia and PT/INR in a.m. -Continue to monitor neurostatus closely  Acute on chronic anemia, reported dark stools.  FOBT +. On ferrous sulfate 3 days weekly. Hg 10.2 -> 9.8 -> 8.9 -> 8.6.  -See plan above  Hyponatremia secondary to cirrhosis  CKD, Cr 1.38 -> 1.39  History of colon polyps.  Colonoscopy 01/2020 identified 1 tubular adenomatous polyp removed from the transverse colon.  History of Parkinson's disease  DM type II   [name]  03/02/2023, 12:13 PM

## 2023-03-02 NOTE — ED Notes (Signed)
ED TO INPATIENT HANDOFF REPORT  ED Nurse Name and Phone #: Melburn Popper Name/Age/Gender Corey Wood 72 y.o. male Room/Bed: 017C/017C  Code Status   Code Status: Prior  Home/SNF/Other Home Patient oriented to: self Is this baseline? Yes   Triage Complete: Triage complete  Chief Complaint GI bleeding [K92.2] GI bleed [K92.2]  Triage Note Pt is coming in with dark stool starting Saturday. Pt has history of same. Pt complaining of weakness and malaise.   Ems vs  Hr 86 - afib Bp 120/70  98% on 4l - baseline 3l  Cbg 327    Pt family reports pt has been having increasing confusion. Family reports that pt has been swallowing a lot and "acting like he has a sore throat". Pt recently fallen at home.    Allergies Allergies  Allergen Reactions   Lisinopril Cough    Level of Care/Admitting Diagnosis ED Disposition     ED Disposition  Admit   Condition  --   Comment  Hospital [facility] [[id number]]  Level of Care: Progressive [102]  Admit to Progressive based on following criteria: MULTISYSTEM THREATS such as stable sepsis, metabolic/electrolyte imbalance with or without encephalopathy that is responding to early treatment.  May admit patient to [facility] or [facility] if equivalent level of care is available:: Yes  Covid Evaluation: Asymptomatic - no recent exposure (last 10 days) testing not required  Diagnosis: GI bleed [025852]  Admitting Physician: Gery Pray [[id number]]  Attending Physician: Briant Cedar [[id number]]  Certification:: I certify this patient will need inpatient services for at least 2 midnights  Expected Medical Readiness: 03/03/2023          B Medical/Surgery History Past Medical History:  Diagnosis Date   Allergy    seasonal allergies   Anxiety    on meds   Aortic valve disorder    Arthritis    generalized (fingers)(shoulders)   Asthma    uses inhaler   Cataract    bilateral -sx    Cirrhosis  (HCC)    CKD (chronic kidney disease), stage III (HCC)    only has one kidney   COPD (chronic obstructive pulmonary disease) (HCC)    Depression    on meds   DM (diabetes mellitus) (HCC)    Type II   Dyspnea    GERD (gastroesophageal reflux disease)    on meds   Heart murmur    History of colon polyps    History of COVID-19 10/14/2020   History of kidney stones    Hx of acute pancreatitis 10/2019   Hyperlipidemia    on meds   Hypertension    on meds   Hypothyroidism    not on meds at this time   Myelodysplastic syndrome (HCC)    Neuromuscular disorder (HCC)    per pt   Obstructive sleep apnea    Oxygen deficiency    Parkinsonism (HCC)    per pt report   Peripheral edema    bilateral legs   Peripheral positional vertigo    Skin cancer of anterior chest    Sleep apnea    No CPAP   Stroke [facility])    Past Surgical History:  Procedure Laterality Date   ANKLE SURGERY     CHOLECYSTECTOMY     ENDARTERECTOMY Left 04/29/2020   Procedure: CAROTID EXPLORATION removal of  central venous catheter;  Surgeon: Larina Earthly, MD;  Location: [facility] OR;  Service: Vascular;  Laterality: Left;  ESOPHAGEAL BANDING N/A 04/17/2020   Procedure: ESOPHAGEAL BANDING;  Surgeon: Benancio Deeds, MD;  Location: [facility] ENDOSCOPY;  Service: Gastroenterology;  Laterality: N/A;   ESOPHAGEAL BANDING  04/29/2020   Procedure: ESOPHAGEAL BANDING;  Surgeon: Meridee Score Netty Starring., MD;  Location: [facility] ENDOSCOPY;  Service: Gastroenterology;;   ESOPHAGEAL BANDING N/A 06/10/2020   Procedure: ESOPHAGEAL BANDING;  Surgeon: Benancio Deeds, MD;  Location: [facility] ENDOSCOPY;  Service: Gastroenterology;  Laterality: N/A;   ESOPHAGEAL BANDING N/A 11/10/2020   Procedure: ESOPHAGEAL BANDING;  Surgeon: Benancio Deeds, MD;  Location: [facility] ENDOSCOPY;  Service: Gastroenterology;  Laterality: N/A;   ESOPHAGEAL BANDING N/A 02/10/2021   Procedure: ESOPHAGEAL BANDING;  Surgeon: Beverley Fiedler, MD;  Location: [facility] ENDOSCOPY;  Service:  Gastroenterology;  Laterality: N/A;   ESOPHAGEAL BANDING N/A 04/21/2021   Procedure: ESOPHAGEAL BANDING;  Surgeon: Rachael Fee, MD;  Location: [facility] ENDOSCOPY;  Service: Endoscopy;  Laterality: N/A;   ESOPHAGEAL BANDING N/A 07/16/2021   Procedure: ESOPHAGEAL BANDING;  Surgeon: Meryl Dare, MD;  Location: [facility] ENDOSCOPY;  Service: Gastroenterology;  Laterality: N/A;   ESOPHAGEAL BANDING  01/18/2022   Procedure: ESOPHAGEAL BANDING;  Surgeon: Iva Boop, MD;  Location: [facility] ENDOSCOPY;  Service: Gastroenterology;;   ESOPHAGOGASTRODUODENOSCOPY N/A 04/25/2020   Procedure: ESOPHAGOGASTRODUODENOSCOPY (EGD);  Surgeon: Jeani Hawking, MD;  Location: [facility];  Service: Endoscopy;  Laterality: N/A;   ESOPHAGOGASTRODUODENOSCOPY (EGD) WITH PROPOFOL N/A 04/17/2020   Procedure: ESOPHAGOGASTRODUODENOSCOPY (EGD) WITH PROPOFOL;  Surgeon: Benancio Deeds, MD;  [facility]OSCOPY;  Service: Gastroenterology;  Laterality: N/A;   ESOPHAGOGASTRODUODENOSCOPY (EGD) WITH PROPOFOL N/A 04/29/2020   Procedure: ESOPHAGOGASTRODUODENOSCOPY (EGD) WITH PROPOFOL;  Surgeon: Meridee Score Netty Starring., MD;  Location: [facility] ENDOSCOPY;  Service: Gastroenterology;  Laterality: N/A;   ESOPHAGOGASTRODUODENOSCOPY (EGD) WITH PROPOFOL N/A 06/10/2020   Procedure: ESOPHAGOGASTRODUODENOSCOPY (EGD) WITH PROPOFOL;  Surgeon: Benancio Deeds, MD;  Location: [facility]OS[facility];  Service: Gastroenterology;  Laterality: N/A;   ESOPHAGOGASTRODUODENOSCOPY (EGD) WITH PROPOFOL N/A 11/10/2020   Procedure: ESOPHAGOGASTRODUODENOSCOPY (EGD) WITH PROPOFOL;  Surgeon: Benancio Deeds, MD;  Location[facility];  Service: Gastroenterology;  Laterality: N/A;   ESOPHAGOGASTRODUODENOSCOPY (EGD) WITH PROPOFOL N/A 02/10/2021   Procedure: ESOPHAGOGASTRODUODENOSCOPY (EGD) WITH PROPOFOL;  Surgeon: Beverley Fiedler, MD;  Location: [facility] ENDOSCOPY;  Service: Gastroenterology;  Laterality: N/A;   ESOPHAGOGASTRODUODENOSCOPY (EGD) WITH PROPOFOL N/A 03/31/2021    Procedure: ESOPHAGOGASTRODUODENOSCOPY (EGD) WITH PROPOFOL;  Surgeon: Benancio Deeds, MD;  [facility]OS[facility];  Service: Gastroenterology;  Laterality: N/A;   ESOPHAGOGASTRODUODENOSCOPY (EGD) WITH PROPOFOL N/A 04/21/2021   Procedure: ESOPHAGOGASTRODUODENOSCOPY (EGD) WITH PROPOFOL;  Surgeon: Rachael Fee, MD;  Location: [facility]OSCOPY;  Service: Endoscopy;  Laterality: N/A;   ESOPHAGOGASTRODUODENOSCOPY (EGD) WITH PROPOFOL N/A 07/16/2021   Procedure: ESOPHAGOGASTRODUODENOSCOPY (EGD) WITH PROPOFOL;  Surgeon: Meryl Dare, MD;  Location: [facility]OSCOPY;  Service: Gastroenterology;  Laterality: N/A;   ESOPHAGOGASTRODUODENOSCOPY (EGD) WITH PROPOFOL N/A 10/15/2021   Procedure: ESOPHAGOGASTRODUODENOSCOPY (EGD) WITH PROPOFOL;  Surgeon: Benancio Deeds, MD;  Location: [facility] ENDOSCOPY;  Service: Gastroenterology;  Laterality: N/A;   ESOPHAGOGASTRODUODENOSCOPY (EGD) WITH PROPOFOL N/A 01/18/2022   Procedure: ESOPHAGOGASTRODUODENOSCOPY (EGD) WITH PROPOFOL;  Surgeon: Iva Boop, MD;  Location: [facility] ENDOSCOPY;  Service: Gastroenterology;  Laterality: N/A;   EYE SURGERY     HERNIA REPAIR     IR INTRAVASCULAR ULTRASOUND NON CORONARY  03/26/2022   IR PARACENTESIS  09/18/2020   IR RADIOLOGIST EVAL & MGMT  10/22/2021   IR THORACENTESIS ASP PLEURAL SPACE W/IMG GUIDE  10/05/2021   IR THORACENTESIS ASP PLEURAL SPACE W/IMG  GUIDE  12/27/2022   IR TIPS  03/26/2022   IR US GUIDE VASC ACCESS RIGHT  03/26/2022   KIDNEY STONE SURGERY     RADIOLOGY WITH ANESTHESIA N/A 03/26/2022   Procedure: TIPS;  Surgeon: Berdine Dance, MD;  Location: [facility] OR;  Service: Radiology;  Laterality: N/A;   WISDOM TOOTH EXTRACTION       A IV Location/Drains/Wounds Patient Lines/Drains/Airways Status     Active Line/Drains/Airways     Name Placement date Placement time Site Days   Peripheral IV 04/01/22 20 G Anterior;Proximal;Right Forearm 04/01/22  1400  Forearm  335   Peripheral IV 03/01/23 20 G 03/01/23  1749  --  1    Peripheral IV 03/02/23 22 G Anterior;Distal;Right;Upper Arm 03/02/23  0138  Arm  less than 1   Wound / Incision (Open or Dehisced) 04/29/20 Laceration Toe (Comment  which one) Anterior;Left 04/29/20  0900  Toe (Comment  which one)  1037   Wound / Incision (Open or Dehisced) 04/02/22 Skin tear Arm Anterior;Left;Lower;Proximal;Lateral skin tear to LEFT Anterior/Lateral forearm x 2 04/02/22  0030  Arm  334   Wound / Incision (Open or Dehisced) 04/03/22 Skin tear Pretibial Right red 04/03/22  0405  Pretibial  333   Wound / Incision (Open or Dehisced) 03/02/23 Irritant Dermatitis (Moisture Associated Skin Damage) Buttocks Left full thickness wound to left buttocks related to moisture associated skin damage, not pressure 03/02/23  --  Buttocks  less than 1            Intake/Output Last 24 hours No intake or output data in the 24 hours ending 03/02/23 1711  Labs/Imaging Results for orders placed or performed during the hospital encounter of 03/01/23 (from the past 48 hour(s))  Comprehensive metabolic panel     Status: Abnormal   Collection Time: 03/01/23  3:36 PM  Result Value Ref Range   Sodium 129 (L) 135 - 145 mmol/L   Potassium 4.3 3.5 - 5.1 mmol/L   Chloride 92 (L) 98 - 111 mmol/L   CO2 26 22 - 32 mmol/L   Glucose, Bld 190 (H) 70 - 99 mg/dL    Comment: Glucose reference range applies only to samples taken after fasting for at least 8 hours.   BUN 38 (H) 8 - 23 mg/dL   Creatinine, Ser 0.98 (H) 0.61 - 1.24 mg/dL   Calcium 11.9 8.9 - 14.7 mg/dL   Total Protein 6.2 (L) 6.5 - 8.1 g/dL   Albumin 2.5 (L) 3.5 - 5.0 g/dL   AST 56 (H) 15 - 41 U/L   ALT 12 0 - 44 U/L   Alkaline Phosphatase 94 38 - 126 U/L   Total Bilirubin 1.1 <1.2 mg/dL   GFR, Estimated 53 (L) >60 mL/min    Comment: (NOTE) Calculated using the CKD-EPI Creatinine Equation (2021)    Anion gap 11 5 - 15    Comment: Performed at [facility] Lab, [address] [place], [place] [address]  CBC     Status: Abnormal    Collection Time: 03/01/23  3:36 PM  Result Value Ref Range   WBC 5.3 4.0 - 10.5 K/uL   RBC 2.91 (L) 4.22 - 5.81 MIL/uL   Hemoglobin 10.2 (L) 13.0 - 17.0 g/dL   HCT 21.3 (L) 08.6 - 57.8 %   MCV 101.7 (H) 80.0 - 100.0 fL   MCH 35.1 (H) 26.0 - 34.0 pg   MCHC 34.5 30.0 - 36.0 g/dL   RDW 46.9 62.9 - 52.8 %  Platelets 92 (L) 150 - 400 K/uL    Comment: Immature Platelet Fraction may be clinically indicated, consider ordering this additional test [id number] REPEATED TO VERIFY    nRBC 0.0 0.0 - 0.2 %    Comment: Performed at [facility] Lab, [address], [place], [place] [address]  Ammonia     Status: Abnormal   Collection Time: 03/01/23  3:36 PM  Result Value Ref Range   Ammonia 67 (H) 9 - 35 umol/L    Comment: Performed at [facility] Lab, [address] [place], [place] [address]  Protime-INR     Status: Abnormal   Collection Time: 03/01/23  3:36 PM  Result Value Ref Range   Prothrombin Time 16.0 (H) 11.4 - 15.2 seconds   INR 1.3 (H) 0.8 - 1.2    Comment: (NOTE) INR goal varies based on device and disease states. Performed at [facility] Lab, [address], [place], [place] [address]   Type and screen [facility]     Status: None   Collection Time: 03/01/23  3:41 PM  Result Value Ref Range   ABO/RH(D) O POS    Antibody Screen NEG    Sample Expiration      03/04/2023,2359 Performed at [facility], [address], [place], [place] [address]   POC occult blood, ED     Status: Abnormal   Collection Time: 03/01/23  4:12 PM  Result Value Ref Range   Fecal Occult Bld POSITIVE (A) NEGATIVE  Lipase, blood     Status: None   Collection Time: 03/01/23  4:36 PM  Result Value Ref Range   Lipase 25 11 - 51 U/L    Comment: Performed at [facility] Lab, [address] [place], [place] [address]  Urinalysis, Routine w reflex microscopic -Urine, Clean Catch     Status: None   Collection Time: 03/01/23  8:53 PM  Result Value Ref Range   Color,  Urine YELLOW YELLOW   APPearance CLEAR CLEAR   Specific Gravity, Urine 1.026 1.005 - 1.030   pH 5.0 5.0 - 8.0   Glucose, UA NEGATIVE NEGATIVE mg/dL   Hgb urine dipstick NEGATIVE NEGATIVE   Bilirubin Urine NEGATIVE NEGATIVE   Ketones, ur NEGATIVE NEGATIVE mg/dL   Protein, ur NEGATIVE NEGATIVE mg/dL   Nitrite NEGATIVE NEGATIVE   Leukocytes,Ua NEGATIVE NEGATIVE   RBC / HPF 0-5 0 - 5 RBC/hpf   WBC, UA 0-5 0 - 5 WBC/hpf   Bacteria, UA NONE SEEN NONE SEEN   Squamous Epithelial / HPF 0-5 0 - 5 /HPF    Comment: Performed at [facility], [address] [place], [place] [address]  Hemoglobin and hematocrit, blood     Status: Abnormal   Collection Time: 03/01/23 11:52 PM  Result Value Ref Range   Hemoglobin 9.8 (L) 13.0 - 17.0 g/dL   HCT 06.3 (L) 01.6 - 01.0 %    Comment: Performed at [facility] Lab, [address] [place], [place] [address]  CBG monitoring, ED     Status: None   Collection Time: 03/02/23  1:26 AM  Result Value Ref Range   Glucose-Capillary 87 70 - 99 mg/dL    Comment: Glucose reference range applies only to samples taken after fasting for at least 8 hours.  Hemoglobin and hematocrit, blood     Status: Abnormal   Collection Time: 03/02/23  1:29 AM  Result Value Ref Range   Hemoglobin 8.9 (  L) 13.0 - 17.0 g/dL   HCT 16.1 (L) 09.6 - 04.5 %    Comment: Performed at [facility] Lab, [address] [place], [place] [address]  Hemoglobin A1c     Status: Abnormal   Collection Time: 03/02/23  1:29 AM  Result Value Ref Range   Hgb A1c MFr Bld 6.5 (H) 4.8 - 5.6 %    Comment: (NOTE) Pre diabetes:          5.7%-6.4%  Diabetes:              >6.4%  Glycemic control for   <7.0% adults with diabetes    Mean Plasma Glucose 139.85 mg/dL    Comment: Performed at [facility], [address], [place], [place] [address]  CBG monitoring, ED     Status: Abnormal   Collection Time: 03/02/23  4:47 AM  Result Value Ref Range   Glucose-Capillary 105 (H) 70 - 99  mg/dL    Comment: Glucose reference range applies only to samples taken after fasting for at least 8 hours.  Hemoglobin and hematocrit, blood     Status: Abnormal   Collection Time: 03/02/23  4:48 AM  Result Value Ref Range   Hemoglobin 8.6 (L) 13.0 - 17.0 g/dL   HCT 82.9 (L) 56.2 - 13.0 %    Comment: Performed at [facility] Lab, [address], [place], [place] [address]  CBG monitoring, ED     Status: Abnormal   Collection Time: 03/02/23  7:54 AM  Result Value Ref Range   Glucose-Capillary 118 (H) 70 - 99 mg/dL    Comment: Glucose reference range applies only to samples taken after fasting for at least 8 hours.   Comment 1 Notify RN    Comment 2 Document in Chart   Hemoglobin and hematocrit, blood     Status: Abnormal   Collection Time: 03/02/23 12:40 PM  Result Value Ref Range   Hemoglobin 9.3 (L) 13.0 - 17.0 g/dL   HCT 46.9 (L) 62.9 - 52.8 %    Comment: Performed at [facility] Lab, [address], [place], [place] [address]  CBG monitoring, ED     Status: Abnormal   Collection Time: 03/02/23 12:54 PM  Result Value Ref Range   Glucose-Capillary 131 (H) 70 - 99 mg/dL    Comment: Glucose reference range applies only to samples taken after fasting for at least 8 hours.  CBG monitoring, ED     Status: Abnormal   Collection Time: 03/02/23  3:52 PM  Result Value Ref Range   Glucose-Capillary 112 (H) 70 - 99 mg/dL    Comment: Glucose reference range applies only to samples taken after fasting for at least 8 hours.   CT ABDOMEN PELVIS W CONTRAST  Result Date: 03/01/2023 CLINICAL DATA:  Abdominal pain. EXAM: CT ABDOMEN AND PELVIS WITH CONTRAST TECHNIQUE: Multidetector CT imaging of the abdomen and pelvis was performed using the standard protocol following bolus administration of intravenous contrast. RADIATION DOSE REDUCTION: This exam was performed according to the departmental dose-optimization program which includes automated exposure control, adjustment of the mA and/or kV  according to patient size and/or use of iterative reconstruction technique. CONTRAST:  75mL OMNIPAQUE IOHEXOL 350 MG/ML SOLN COMPARISON:  CT of the chest abdomen pelvis dated 04/01/2022. FINDINGS: Lower chest: Partially visualized small bilateral pleural effusions. There are consolidative changes of the visualized lower lobes which may represent atelectasis or infiltrate. No intra-abdominal free air or free fluid. Hepatobiliary: Cirrhosis.  No biliary dilatation.  Cholecystectomy. Pancreas: Unremarkable. No pancreatic ductal dilatation or surrounding inflammatory changes. Spleen: Splenomegaly measuring 16 cm in length. Adrenals/Urinary Tract: The adrenal glands unremarkable. Severe chronic left hydronephrosis and left renal parenchyma atrophy. There is a 5 mm nonobstructing stone in the interpolar left kidney. There is a 6 mm stone in the distal left ureter with moderate left hydroureter. Punctate nonobstructing right renal upper pole calculus. No hydronephrosis on the right. The right ureter is unremarkable. The urinary bladder is minimally distended. There is diffuse thickening of the anterior bladder wall with mild trabeculation which may be related to chronic bladder outlet obstruction. Correlation with urinalysis recommended to exclude UTI. Stomach/Bowel: Small hiatal hernia. Mild thickened appearance of the distal esophagus may represent esophagitis related to reflux. Clinical correlation recommended. There is no bowel obstruction. The appendix is normal. Vascular/Lymphatic: Mild aortoiliac atherosclerotic disease. The IVC is unremarkable. A TIPS is noted. The SMV, splenic vein, and main portal vein appear patent. No portal venous gas. No adenopathy. Reproductive: The prostate and seminal vesicles are grossly unremarkable. No pelvic mass. Other: None Musculoskeletal: Osteopenia with degenerative changes. Age indeterminate, chronic appearing compression fracture of the inferior endplate of T12. No acute osseous  pathology. IMPRESSION: 1. A 6 mm stone in the distal left ureter with moderate left hydroureter. 2. Severe chronic left hydronephrosis and left renal parenchyma atrophy. 3. Nonobstructing bilateral renal calculi. No hydronephrosis on the right. 4. Cirrhosis with splenomegaly. 5. Partially visualized small bilateral pleural effusions with bibasilar atelectasis or infiltrate. 6.  Aortic Atherosclerosis (ICD10-I70.0). Electronically Signed   By: Elgie Collard M.D.   On: 03/01/2023 20:10   CT Head Wo Contrast  Result Date: 03/01/2023 CLINICAL DATA:  Altered mental status EXAM: CT HEAD WITHOUT CONTRAST TECHNIQUE: Contiguous axial images were obtained from the base of the skull through the vertex without intravenous contrast. RADIATION DOSE REDUCTION: This exam was performed according to the departmental dose-optimization program which includes automated exposure control, adjustment of the mA and/or kV according to patient size and/or use of iterative reconstruction technique. COMPARISON:  02/01/2023 FINDINGS: Brain: No evidence of acute infarction, hemorrhage, mass, mass effect, or midline shift. No hydrocephalus or extra-axial fluid collection. Basal ganglia calcifications. Age related cerebral atrophy. Periventricular white matter changes, likely the sequela of chronic small vessel ischemic disease. Vascular: No hyperdense vessel. Skull: Negative for fracture or focal lesion. Sinuses/Orbits: No acute finding. Status post bilateral lens replacements. Other: Trace fluid in the left mastoid air cells. IMPRESSION: No acute intracranial process. Electronically Signed   By: Wiliam Ke M.D.   On: 03/01/2023 19:21   DG Chest Portable 1 View  Result Date: 03/01/2023 CLINICAL DATA:  Weakness.  Altered mental status. EXAM: PORTABLE CHEST 1 VIEW COMPARISON:  01/12/2023. FINDINGS: Redemonstration of small-to-moderate left pleural effusion with associated compressive atelectatic changes in the left lung. There is trace  right pleural effusion, slightly decreased since the prior study. Bilateral lung fields are otherwise clear. No acute consolidation or lung collapse. No pneumothorax. No pulmonary edema. Stable cardio-mediastinal silhouette. No acute osseous abnormalities. The soft tissues are within normal limits. IMPRESSION: *Redemonstration of bilateral pleural effusions, left-greater-than-right. No acute consolidation or lung collapse. Electronically Signed   By: Jules Schick M.D.   On: 03/01/2023 16:02    Pending Labs Unresulted Labs (From admission, onward)     Start     Ordered   03/03/23 0500  Comprehensive metabolic panel  Daily,   R      03/02/23 1639   03/03/23  0500  Ammonia  Daily,   R      03/02/23 1639   03/03/23 0500  Protime-INR  Daily,   R      03/02/23 1651   03/02/23 1900  Basic metabolic panel  Once,   R        03/02/23 1638   03/01/23 2336  Hemoglobin and hematocrit, blood  Now then every 6 hours,   R (with TIMED occurrences)      03/01/23 2335            Vitals/Pain Today's Vitals   03/02/23 1415 03/02/23 1615 03/02/23 1621 03/02/23 1645  BP: 102/61 102/83  (!) 98/52  Pulse: (!) 48 61  (!) 50  Resp: (!) 21 (!) 24  (!) 21  Temp:   97.8 F (36.6 C)   TempSrc:   Oral   SpO2: 100% 100%  100%  Weight:      Height:      PainSc:        Isolation Precautions No active isolations  Medications Medications  pantoprazole (PROTONIX) injection 40 mg (40 mg Intravenous Not Given 03/01/23 1847)    Followed by  pantoprazole (PROTONIX) injection 40 mg (40 mg Intravenous Given 03/02/23 0604)  tamsulosin (FLOMAX) capsule 0.4 mg (0.4 mg Oral Given 03/02/23 0846)  rifaximin (XIFAXAN) tablet 550 mg (550 mg Oral Given 03/02/23 0850)  midodrine (PROAMATINE) tablet 20 mg (20 mg Oral Given 03/02/23 1708)  carbidopa-levodopa (SINEMET IR) 25-100 MG per tablet immediate release 2 tablet (2 tablets Oral Given 03/02/23 1707)  guaiFENesin-codeine 100-10 MG/5ML solution 5 mL (5 mLs Oral Not Given  03/02/23 0152)  sodium chloride 0.9 % bolus 500 mL (0 mLs Intravenous Stopped 03/02/23 0230)    Followed by  0.9 %  sodium chloride infusion ( Intravenous Rate/Dose Change 03/02/23 0939)  lactulose (CHRONULAC) 10 GM/15ML solution 10 g (10 g Oral Given 03/02/23 1110)  Gerhardt's butt cream (has no administration in time range)  insulin aspart (novoLOG) injection 0-9 Units (has no administration in time range)  lactulose (CHRONULAC) enema 200 gm (has no administration in time range)  iohexol (OMNIPAQUE) 350 MG/ML injection 75 mL (75 mLs Intravenous Contrast Given 03/01/23 1831)  octreotide (SANDOSTATIN) 2 mcg/mL load via infusion 50 mcg (50 mcg Intravenous Bolus from Bag 03/01/23 1902)  LORazepam (ATIVAN) injection 1 mg (1 mg Intravenous Given 03/01/23 2355)  dextrose 50 % solution 12.5 g (12.5 g Intravenous Given 03/02/23 0148)    Mobility non-ambulatory     Focused Assessments     R Recommendations: See Admitting Provider Note  Report given to:   Additional Notes:

## 2023-03-03 ENCOUNTER — Encounter (HOSPITAL_COMMUNITY)
Admission: EM | Disposition: A | Discharge status: Home Health | Payer: Self-pay | Source: Home / Self Care | Attending: Internal Medicine

## 2023-03-03 ENCOUNTER — Encounter (HOSPITAL_COMMUNITY): Payer: Self-pay | Admitting: Family Medicine

## 2023-03-03 ENCOUNTER — Inpatient Hospital Stay (HOSPITAL_COMMUNITY): Payer: No Typology Code available for payment source | Admitting: Anesthesiology

## 2023-03-03 ENCOUNTER — Other Ambulatory Visit: Payer: Self-pay

## 2023-03-03 DIAGNOSIS — I4891 Unspecified atrial fibrillation: Secondary | ICD-10-CM | POA: Diagnosis not present

## 2023-03-03 DIAGNOSIS — K209 Esophagitis, unspecified without bleeding: Secondary | ICD-10-CM

## 2023-03-03 DIAGNOSIS — K2091 Esophagitis, unspecified with bleeding: Secondary | ICD-10-CM

## 2023-03-03 DIAGNOSIS — K922 Gastrointestinal hemorrhage, unspecified: Secondary | ICD-10-CM | POA: Diagnosis not present

## 2023-03-03 DIAGNOSIS — K921 Melena: Secondary | ICD-10-CM | POA: Diagnosis not present

## 2023-03-03 DIAGNOSIS — Z515 Encounter for palliative care: Secondary | ICD-10-CM

## 2023-03-03 DIAGNOSIS — Z7189 Other specified counseling: Secondary | ICD-10-CM

## 2023-03-03 DIAGNOSIS — Z95828 Presence of other vascular implants and grafts: Secondary | ICD-10-CM

## 2023-03-03 DIAGNOSIS — R188 Other ascites: Secondary | ICD-10-CM | POA: Diagnosis not present

## 2023-03-03 DIAGNOSIS — R4182 Altered mental status, unspecified: Secondary | ICD-10-CM | POA: Diagnosis not present

## 2023-03-03 DIAGNOSIS — K3189 Other diseases of stomach and duodenum: Secondary | ICD-10-CM | POA: Diagnosis not present

## 2023-03-03 DIAGNOSIS — K269 Duodenal ulcer, unspecified as acute or chronic, without hemorrhage or perforation: Secondary | ICD-10-CM | POA: Diagnosis not present

## 2023-03-03 DIAGNOSIS — K7460 Unspecified cirrhosis of liver: Secondary | ICD-10-CM | POA: Diagnosis not present

## 2023-03-03 HISTORY — PX: BIOPSY: SHX5522

## 2023-03-03 HISTORY — PX: ESOPHAGOGASTRODUODENOSCOPY (EGD) WITH PROPOFOL: SHX5813

## 2023-03-03 LAB — COMPREHENSIVE METABOLIC PANEL
ALT: 8 U/L (ref 0–44)
AST: 69 U/L — ABNORMAL HIGH (ref 15–41)
Albumin: 2.7 g/dL — ABNORMAL LOW (ref 3.5–5.0)
Alkaline Phosphatase: 86 U/L (ref 38–126)
Anion gap: 9 (ref 5–15)
BUN: 31 mg/dL — ABNORMAL HIGH (ref 8–23)
CO2: 28 mmol/L (ref 22–32)
Calcium: 9.7 mg/dL (ref 8.9–10.3)
Chloride: 99 mmol/L (ref 98–111)
Creatinine, Ser: 1.33 mg/dL — ABNORMAL HIGH (ref 0.61–1.24)
GFR, Estimated: 56 mL/min — ABNORMAL LOW (ref >60)
Glucose, Bld: 120 mg/dL — ABNORMAL HIGH (ref 70–99)
Potassium: 4 mmol/L (ref 3.5–5.1)
Sodium: 136 mmol/L (ref 135–145)
Total Bilirubin: 1.6 mg/dL — ABNORMAL HIGH (ref <1.2)
Total Protein: 6.8 g/dL (ref 6.5–8.1)

## 2023-03-03 LAB — HEMOGLOBIN AND HEMATOCRIT, BLOOD
HCT: 26.4 % — ABNORMAL LOW (ref 39.0–52.0)
Hemoglobin: 9.5 g/dL — ABNORMAL LOW (ref 13.0–17.0)

## 2023-03-03 LAB — GLUCOSE, CAPILLARY
Glucose-Capillary: 101 mg/dL — ABNORMAL HIGH (ref 70–99)
Glucose-Capillary: 107 mg/dL — ABNORMAL HIGH (ref 70–99)
Glucose-Capillary: 107 mg/dL — ABNORMAL HIGH (ref 70–99)
Glucose-Capillary: 142 mg/dL — ABNORMAL HIGH (ref 70–99)
Glucose-Capillary: 205 mg/dL — ABNORMAL HIGH (ref 70–99)
Glucose-Capillary: 177 mg/dL — ABNORMAL HIGH (ref 70–99)

## 2023-03-03 LAB — PROTIME-INR
INR: 1.1 (ref 0.8–1.2)
Prothrombin Time: 14.8 s (ref 11.4–15.2)

## 2023-03-03 LAB — CBC WITH DIFFERENTIAL/PLATELET
Abs Immature Granulocytes: 0.08 10*3/uL — ABNORMAL HIGH (ref 0.00–0.07)
Basophils Absolute: 0 10*3/uL (ref 0.0–0.1)
Basophils Relative: 1 %
Eosinophils Absolute: 0.1 10*3/uL (ref 0.0–0.5)
Eosinophils Relative: 3 %
HCT: 30.1 % — ABNORMAL LOW (ref 39.0–52.0)
Hemoglobin: 10.4 g/dL — ABNORMAL LOW (ref 13.0–17.0)
Immature Granulocytes: 2 %
Lymphocytes Relative: 12 %
Lymphs Abs: 0.5 10*3/uL — ABNORMAL LOW (ref 0.7–4.0)
MCH: 35.5 pg — ABNORMAL HIGH (ref 26.0–34.0)
MCHC: 34.6 g/dL (ref 30.0–36.0)
MCV: 102.7 fL — ABNORMAL HIGH (ref 80.0–100.0)
Monocytes Absolute: 0.3 10*3/uL (ref 0.1–1.0)
Monocytes Relative: 6 %
Neutro Abs: 3.5 10*3/uL (ref 1.7–7.7)
Neutrophils Relative %: 76 %
Platelets: 99 10*3/uL — ABNORMAL LOW (ref 150–400)
RBC: 2.93 MIL/uL — ABNORMAL LOW (ref 4.22–5.81)
RDW: 14.9 % (ref 11.5–15.5)
WBC: 4.5 10*3/uL (ref 4.0–10.5)
nRBC: 0 % (ref 0.0–0.2)

## 2023-03-03 LAB — AMMONIA: Ammonia: 45 umol/L — ABNORMAL HIGH (ref 9–35)

## 2023-03-03 SURGERY — ESOPHAGOGASTRODUODENOSCOPY (EGD) WITH PROPOFOL
Anesthesia: Monitor Anesthesia Care

## 2023-03-03 MED ORDER — SUCRALFATE 1 GM/10ML PO SUSP
1.0000 g | Freq: Three times a day (TID) | ORAL | Status: DC
  Administered 2023-03-03 – 2023-03-05 (×7): 1 g via ORAL
  Filled 2023-03-03 (×7): qty 10

## 2023-03-03 MED ORDER — PHENYLEPHRINE HCL (PRESSORS) 10 MG/ML IV SOLN
INTRAVENOUS | Status: DC | PRN
  Administered 2023-03-03: 80 ug via INTRAVENOUS
  Administered 2023-03-03: 160 ug via INTRAVENOUS

## 2023-03-03 MED ORDER — PANTOPRAZOLE SODIUM 40 MG PO TBEC
80.0000 mg | DELAYED_RELEASE_TABLET | Freq: Once | ORAL | Status: AC
  Administered 2023-03-03: 80 mg via ORAL
  Filled 2023-03-03: qty 2

## 2023-03-03 MED ORDER — PROPOFOL 500 MG/50ML IV EMUL
INTRAVENOUS | Status: DC | PRN
  Administered 2023-03-03 (×2): 15 mg via INTRAVENOUS
  Administered 2023-03-03: 115 ug/kg/min via INTRAVENOUS

## 2023-03-03 MED ORDER — SODIUM CHLORIDE 0.9 % IV SOLN
INTRAVENOUS | Status: DC | PRN
Start: 2023-03-03 — End: 2023-03-03

## 2023-03-03 MED ORDER — INSULIN ASPART 100 UNIT/ML IJ SOLN
0.0000 [IU] | Freq: Every day | INTRAMUSCULAR | Status: DC
  Administered 2023-03-03: 2 [IU] via SUBCUTANEOUS

## 2023-03-03 MED ORDER — HALOPERIDOL LACTATE 5 MG/ML IJ SOLN
1.0000 mg | Freq: Four times a day (QID) | INTRAMUSCULAR | Status: DC | PRN
  Administered 2023-03-03: 1 mg via INTRAMUSCULAR
  Filled 2023-03-03: qty 1

## 2023-03-03 MED ORDER — INSULIN ASPART 100 UNIT/ML IJ SOLN
0.0000 [IU] | Freq: Three times a day (TID) | INTRAMUSCULAR | Status: DC
  Administered 2023-03-04: 3 [IU] via SUBCUTANEOUS
  Administered 2023-03-04 – 2023-03-05 (×4): 2 [IU] via SUBCUTANEOUS

## 2023-03-03 SURGICAL SUPPLY — 14 items

## 2023-03-03 NOTE — Plan of Care (Signed)
  Problem: Metabolic: Goal: Ability to maintain appropriate glucose levels will improve Outcome: Progressing   Problem: Education: Goal: Ability to describe self-care measures that may prevent or decrease complications (Diabetes Survival Skills Education) will improve Outcome: Not Progressing   Problem: Coping: Goal: Ability to adjust to condition or change in health will improve Outcome: Not Progressing   Problem: Fluid Volume: Goal: Ability to maintain a balanced intake and output will improve Outcome: Not Progressing   Problem: Health Behavior/Discharge Planning: Goal: Ability to manage health-related needs will improve Outcome: Not Progressing   Problem: Nutritional: Goal: Maintenance of adequate nutrition will improve Outcome: Not Progressing   Problem: Skin Integrity: Goal: Risk for impaired skin integrity will decrease Outcome: Not Progressing

## 2023-03-03 NOTE — Evaluation (Signed)
Physical Therapy Evaluation Patient Details Name: Corey Wood MRN: [id number] DOB: 07-May-1950 Today's Date: 03/03/2023  History of Present Illness  PT is a 72 yo male admitted 12/3 for GIB. PMH: COPD, anxiety, depression, T2DM, HLD, HTN, hypothyroidism, CVA, CKD, GERD, Parkinson's  Clinical Impression  Pt pleasant and agreeable to mobility. Pt reported self as ambulatory, son clarified pt is pushed in wc for any distance at baseline. Pt presenting near baseline function per family report, but benefited from family education for further transfer and equipment recommendations. Pt maxA for bed mobility and totalA to transfer bed to chair. Pt was able to use steady with great success requiring CGA to arise to standing within steady. Pt would benefit from continued skilled acute physical therapy to maximize function and lessen care giver support with transfers.     If plan is discharge home, recommend the following: Two people to help with walking and/or transfers;Assistance with cooking/housework;Two people to help with bathing/dressing/bathroom;Assist for transportation;Supervision due to cognitive status   Can travel by private vehicle        Equipment Recommendations Other (comment) (stedy)  Recommendations for Other Services  OT consult    Functional Status Assessment Patient has had a recent decline in their functional status and demonstrates the ability to make significant improvements in function in a reasonable and predictable amount of time.     Precautions / Restrictions Precautions Precautions: Fall Restrictions Weight Bearing Restrictions: No      Mobility  Bed Mobility Overal bed mobility: Needs Assistance Bed Mobility: Rolling, Sidelying to Sit Rolling: Max assist Sidelying to sit: Max assist       General bed mobility comments: Pt required maxA to roll, sit EOB, and scoot while in bed, can move extremities, had difficuluty initiating and sequencing mobility     Transfers Overall transfer level: Needs assistance Equipment used: None, Ambulation equipment used Transfers: Sit to/from Stand, Bed to chair/wheelchair/BSC Sit to Stand: Mod assist, +2 physical assistance   Step pivot transfers: +2 physical assistance, Total assist       General transfer comment: STS x7, from EOB pt was modA +2, initiates movement but requires assist to elevate and stay standing, pts ft slide forward and was dependent on therapist to transfer to chair, standing from recliner with steady pt CGA for tactile cueing, pt completd 5xSTS in steady with intermittent CGA Transfer via Lift Equipment: Stedy  Ambulation/Gait               General Gait Details: does not ambulate at baseline  Stairs            Wheelchair Mobility     Tilt Bed    Modified Rankin (Stroke Patients Only)       Balance Overall balance assessment: Needs assistance Sitting-balance support: Feet supported, Bilateral upper extremity supported Sitting balance-Leahy Scale: Fair Sitting balance - Comments: mod support due to posterior lean when sitting EOB, forward flexed posture when seated in chair, son reports pt usually slides down and out of chair when seated Postural control: Posterior lean, Right lateral lean Standing balance support: Reliant on assistive device for balance, Bilateral upper extremity supported Standing balance-Leahy Scale: Zero                               Pertinent Vitals/Pain      Home Living Family/patient expects to be discharged to:: Private residence Living Arrangements: Spouse/significant other Available Help at Discharge: Family;Available 24  hours/day Type of Home: House Home Access: Ramped entrance       Home Layout: One level Home Equipment: Agricultural consultant (2 wheels);Standard Walker;Rollator (4 wheels);BSC/3in1;Wheelchair - manual;Electric scooter;Hospital bed;Grab bars - tub/shower;Grab bars - toilet;Shower seat - built  in;Lift chair      Prior Function Prior Level of Function : Needs assist;History of Falls (last six months)             Mobility Comments: max assist for transfers, shuffles a few feet with son assist. During day wife and friend assist movement. WC for any distance, hx of falls ADLs Comments: wears adult briefs, max assist for bathing, dressing has a shower seat     Extremity/Trunk Assessment   Upper Extremity Assessment Upper Extremity Assessment: Defer to OT evaluation    Lower Extremity Assessment Lower Extremity Assessment: Generalized weakness    Cervical / Trunk Assessment Cervical / Trunk Assessment: Kyphotic  Communication   Communication Communication: No apparent difficulties;Difficulty following commands/understanding Following commands: Follows one step commands consistently;Follows one step commands with increased time Cueing Techniques: Verbal cues;Tactile cues  Cognition Arousal: Alert Behavior During Therapy: WFL for tasks assessed/performed Overall Cognitive Status: Impaired/Different from baseline Area of Impairment: Memory, Awareness, Safety/judgement, Problem solving                     Memory: Decreased short-term memory   Safety/Judgement: Decreased awareness of safety Awareness: Emergent Problem Solving: Slow processing, Decreased initiation, Difficulty sequencing, Requires verbal cues, Requires tactile cues General Comments: Pt oriented to self, date, and aware he is in the hospital, son present to help clarify history and baseline        General Comments      Exercises     Assessment/Plan    PT Assessment Patient needs continued PT services  PT Problem List Decreased strength;Decreased activity tolerance;Decreased balance;Decreased mobility;Decreased coordination;Decreased cognition;Decreased knowledge of use of DME;Decreased safety awareness       PT Treatment Interventions DME instruction;Gait training;Functional mobility  training;Therapeutic activities;Therapeutic exercise;Balance training;Cognitive remediation;Patient/family education;Wheelchair mobility training    PT Goals (Current goals can be found in the Care Plan section)  Acute Rehab PT Goals Patient Stated Goal: return home PT Goal Formulation: With patient/family Time For Goal Achievement: 03/17/23 Potential to Achieve Goals: Good    Frequency Min 1X/week     Co-evaluation               AM-PAC PT "6 Clicks" Mobility  Outcome Measure Help needed turning from your back to your side while in a flat bed without using bedrails?: A Lot Help needed moving from lying on your back to sitting on the side of a flat bed without using bedrails?: A Lot Help needed moving to and from a bed to a chair (including a wheelchair)?: Total Help needed standing up from a chair using your arms (e.g., wheelchair or bedside chair)?: A Lot Help needed to walk in hospital room?: Total Help needed climbing 3-5 steps with a railing? : Total 6 Click Score: 9    End of Session Equipment Utilized During Treatment: Gait belt Activity Tolerance: Patient tolerated treatment well Patient left: in chair;with call bell/phone within reach;with chair alarm set;with family/visitor present Nurse Communication: Mobility status;Need for lift equipment PT Visit Diagnosis: Other abnormalities of gait and mobility (R26.89);Difficulty in walking, not elsewhere classified (R26.2)    Time: 2956-2130 PT Time Calculation (min) (ACUTE ONLY): 33 min   Charges:   PT Evaluation $PT Eval Moderate Complexity: 1 Mod PT  Treatments $Therapeutic Activity: 8-22 mins PT General Charges $$ ACUTE PT VISIT: 1 Visit         Andrey Farmer SPT Secure chat preferred   Darlin Drop 03/03/2023, 9:37 AM

## 2023-03-03 NOTE — Consult Note (Signed)
Consultation Note Date: 03/03/2023   Patient Name: Corey Wood  DOB: 1950-07-20  MRN: [id number]  Age / Sex: 72 y.o., male  [facility]P: [facility] Referring Physician: Briant Cedar, MD  Reason for Consultation: Establishing goals of care  HPI/Patient Profile: 72 y.o. male  with past medical history of COPD, anxiety depression, T2DM, HLD, HTN, hypothyroidism, neuromuscular disorder, OSA, chronic oxygen dependency, stroke, cirrhosis secondary to NASH, s/p EGD with banding and TIPS, admitted on 03/01/2023 with black stools and confusion.   He developed dark stools Saturday 02/26/2023 with generalized weakness which progressively worsened therefore he presented to the ED 03/01/2023 for further evaluation. He is admitted for acute on chronic anemia due to GI bleeding and hepatic encephalopathy. Plan for EGD today 12/5.  PMT has been consulted to assist with goals of care conversation.  Clinical Assessment and Goals of Care:  I have reviewed medical records including EPIC notes, labs and imaging, and then had a phone conversation with patient's wife to discuss diagnosis prognosis, GOC, EOL wishes, disposition and options. I then returned to the bedside and spoke with patient, his son, and his granddaughter.  I introduced Palliative Medicine as specialized medical care for people living with serious illness. It focuses on providing relief from the symptoms and stress of a serious illness. The goal is to improve quality of life for both the patient and the family.  We discussed a brief life review of the patient and then focused on their current illness.   I attempted to elicit values and goals of care important to the patient.    Medical History Review and Understanding:  Discussed patient's acute illness in the context of his chronic comorbidities. Patient's wife states "I know there is no cure for the  problems he has."  Social History: Patient and his wife have been married for 53 years. They have 3 sons, 2 of which live locally and 1 who lives in [place] and visits every 1-2 weekends. [facility] states [facility]. He has a HH RN who put in a request for a hospice referral on their behalf last week. Wife states she is still waiting to hear from [facility] regarding this referral.  Functional and Nutritional State: Patient has had ongoing decline since he was last seen by PMT in January 2024. Albumin of 2.5 noted. It is getting harder and harder for family to assist him with transfers and they are interested in acquiring a Stedy for him to use at home if possible.  Palliative Symptoms: Confusion  Code Status: DNR/DNI confirmed.  Discussion: Patient's wife is unfortunately dealing with her own health problems but we were able to have a phone call and discuss patient's quality of life, goals, and care preferences. She feels that while she does not want to lose him, she also does not want him to continue suffering and would like to ensure that he is as comfortable as possible. This is why she has been discussing hospice in the outpatient setting. I reviewed hospice philosophy as end-of-life care and offered to assist with coordination of hospice evaluation during admission and potential DME delivery prior to discharge. She is agreeable. She has a good understanding of the current care plan and greatly appreciates GI physician's efforts to assist [name] as much as possible. Patient's son agrees that he is interested in a device to assist with mobility and transfers at home. I explored his thoughts and feelings on patient's current illness and  the focus of his care moving forward. He tells me that patient's wife mainly makes these decisions and plans and that he is available for any additional support that is helpful.   The difference between aggressive medical  intervention and comfort care was considered in light of the patient's goals of care. Hospice and Palliative Care services outpatient were explained and offered.   Discussed the importance of continued conversation with family and the medical providers regarding overall plan of care and treatment options, ensuring decisions are within the context of the patient's values and GOCs.   Questions and concerns were addressed.  Hard Choices booklet left for review. The family was encouraged to call with questions or concerns.  PMT will continue to support holistically.   SUMMARY OF RECOMMENDATIONS   -Continue DNR/DNI -Continue current supportive care  -Goal is for return home with hospice. TOC consulted for assistance with referral to [facility] -Psychosocial and emotional support provided -PMT will continue to follow and support as needed   Prognosis:  < 6 months  Discharge Planning: Home with Hospice      Primary Diagnoses: Present on Admission:  GI bleeding  Bleeding esophageal varices (HCC)  Chronic kidney disease, stage 3a (HCC)  Chronic obstructive pulmonary disease (HCC)  Encephalopathy, hepatic (HCC)  Pancytopenia (HCC)  Liver cirrhosis secondary to NASH (HCC)  Parkinson's disease (HCC)  Portal hypertension (HCC)  GI bleed   Physical Exam Vitals and nursing note reviewed.  Constitutional:      General: He is not in acute distress. Cardiovascular:     Rate and Rhythm: Normal rate.  Pulmonary:     Effort: Pulmonary effort is normal. No respiratory distress.  Skin:    General: Skin is warm and dry.  Neurological:     Mental Status: He is alert.  Psychiatric:        Mood and Affect: Mood normal.        Behavior: Behavior normal.    Vital Signs: BP (!) 148/108 (BP Location: Left Arm)   Pulse 76   Temp (!) 97.5 F (36.4 C) (Axillary)   Resp 19   Ht 5\' 7"  (1.702 m)   Wt 76.2 kg   SpO2 99%   BMI 26.31 kg/m  Pain Scale: Faces   Pain Score:  Asleep   SpO2: SpO2: 99 % O2 Device:SpO2: 99 % O2 Flow Rate: .O2 Flow Rate (L/min): 3 L/min   Palliative Assessment/Data: 30-40%     MDM: High    Auden Tatar Jeni Salles, PA-C  Palliative Medicine Team Team phone # [phone]  Thank you for allowing the Palliative Medicine Team to assist in the care of this patient. Please utilize secure chat with additional questions, if there is no response within 30 minutes please call the above phone number.  Palliative Medicine Team providers are available by phone from 7am to 7pm daily and can be reached through the team cell phone.  Should this patient require assistance outside of these hours, please call the patient's attending physician.

## 2023-03-03 NOTE — Plan of Care (Signed)
  Problem: Fluid Volume: Goal: Ability to maintain a balanced intake and output will improve Outcome: Progressing   Problem: Metabolic: Goal: Ability to maintain appropriate glucose levels will improve Outcome: Progressing   Problem: Nutritional: Goal: Maintenance of adequate nutrition will improve Outcome: Progressing Goal: Progress toward achieving an optimal weight will improve Outcome: Progressing   Problem: Education: Goal: Ability to describe self-care measures that may prevent or decrease complications (Diabetes Survival Skills Education) will improve Outcome: Not Progressing   Problem: Health Behavior/Discharge Planning: Goal: Ability to manage health-related needs will improve Outcome: Not Progressing   Problem: Skin Integrity: Goal: Risk for impaired skin integrity will decrease Outcome: Not Progressing

## 2023-03-03 NOTE — TOC Initial Note (Signed)
Transition of Care (TOC) - Initial/Assessment Note  Donn Pierini RN, BSN Transitions of Care Unit 4E- RN Case Manager See Treatment Team for direct phone #   Patient Details  Name: Corey Wood MRN: [id number] Date of Birth: 1951/01/15  Transition of Care [facility]) CM/SW Contact:    Darrold Span, RN Phone Number: 03/03/2023, 3:47 PM  Clinical Narrative:                 Referral noted from [facility] regarding wife's request for home hospice needs with [facility].  Call made to [facility]- spoke with [name] regarding referral for home hospice needs per wife request.   Referral has been taken and Hospice will f/u with wife tomorrow.   TOC will continue to follow for transition needs.   Expected Discharge Plan: Home w Hospice Care Barriers to Discharge: Continued Medical Work up   Patient Goals and CMS Choice Patient states their goals for this hospitalization and ongoing recovery are:: return home CMS Medicare.gov Compare Post Acute Care list provided to:: Patient Choice offered to / list presented to : Spouse      Expected Discharge Plan and Services   Discharge Planning Services: CM Consult Post Acute Care Choice: Hospice Living arrangements for the past 2 months: Single Family Home                             [facility] Agency: [facility] Date [facility] Agency Contacted: 03/03/23 Time HH Agency Contacted: [phone] Representative spoke with at [facility] Agency: [facility]  Prior Living Arrangements/Services Living arrangements for the past 2 months: Single Family Home Lives with:: Spouse Patient language and need for interpreter reviewed:: Yes Do you feel safe going back to the place where you live?: Yes      Need for Family Participation in Patient Care: Yes (Comment) Care giver support system in place?: Yes (comment)   Criminal Activity/Legal Involvement Pertinent to Current Situation/Hospitalization: No - Comment as needed  Activities of Daily  Living      Permission Sought/Granted Permission sought to share information with : Facility Industrial/product designer granted to share information with : Yes, Verbal Permission Granted     Permission granted to share info w AGENCY: [facility]        Emotional Assessment Appearance:: Appears stated age       Alcohol / Substance Use: Not Applicable Psych Involvement: No (comment)  Admission diagnosis:  GI bleeding [K92.2] GI bleed [K92.2] Altered mental status, unspecified altered mental status type [R41.82] Gastrointestinal hemorrhage, unspecified gastrointestinal hemorrhage type [K92.2] Patient Active Problem List   Diagnosis Date Noted   Acute esophagitis 03/03/2023   GI bleed 03/02/2023   Melena 03/02/2023   GI bleeding 03/01/2023   Ileus (HCC) 04/07/2022   Goals of care, counseling/discussion 04/07/2022   S/P TIPS (transjugular intrahepatic portosystemic shunt) 04/02/2022   Hyperammonemia (HCC) 04/02/2022   Encephalopathy, hepatic (HCC) 04/01/2022   Pleural effusion on left 04/01/2022   Ascending aortic aneurysm (HCC) 04/01/2022   Chronic left hydroureteronephrosis 04/01/2022   Parkinson's disease (HCC) 04/01/2022   NASH (nonalcoholic steatohepatitis) 03/26/2022   Decompensation of cirrhosis of liver (HCC) 03/26/2022   Chronic obstructive pulmonary disease (HCC) 01/18/2022   Chronic kidney disease, stage 3a (HCC) 01/18/2022   Hematemesis 01/17/2022   Thrombocytopenia (HCC)    Portal hypertension (HCC)    Esophageal varices without bleeding (HCC)    Anemia 10/14/2020  Hypokalemia 10/14/2020   Bleeding esophageal varices (HCC)    Acute upper GI bleed 04/25/2020   Other cirrhosis of liver (HCC)    Age-related nuclear cataract of right eye 09/15/2016   Pseudophakia of left eye 09/15/2016   Intraoperative floppy iris syndrome (IFIS) 01/13/2016   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/13/2016   Acquired color vision deficiency 11/24/2015   Nuclear sclerosis of  right eye 11/24/2015   Open angle with borderline findings and high glaucoma risk in both eyes 11/24/2015   Type 2 diabetes mellitus with stage 3a chronic kidney disease, with long-term current use of insulin (HCC) 11/24/2015   PCP:  [facility] Pharmacy:   [facility] - [place], [place] - [address] [place] [address] Phone: [phone] Fax: [phone]  [id number] - [place],  - [address] [place] [address] Phone: [phone] Fax: [phone]  [place] [facility] [address] [place] [address] Phone: [phone] Fax: [phone]  [facility] - [place], [place] - [address] [place] [address] Phone: [phone] Fax: [phone]     Social Determinants of Health (SDOH) Social History: SDOH Screenings   Food Insecurity: No Food Insecurity (03/03/2023)  Housing: Patient Unable To Answer (03/03/2023)  Transportation Needs: Patient Unable To Answer (03/03/2023)  Utilities: Patient Unable To Answer (03/03/2023)  Social Connections: Unknown (08/10/2021)   Received from [facility]  Tobacco Use: Low Risk  (03/03/2023)   SDOH Interventions:     Readmission Risk Interventions     No data to display

## 2023-03-03 NOTE — Progress Notes (Signed)
Progress Note   Subjective  Lactulose enema given overnight, led to BM overnight and again this AM. Brown stool per nursing, no melena. Mental status significantly improved this AM, seems closer to baseline   Objective   Vital signs in last 24 hours: Temp:  [97.5 F (36.4 C)-98.5 F (36.9 C)] 97.5 F (36.4 C) (12/05 0340) Pulse Rate:  [48-79] 76 (12/05 0736) Resp:  [16-24] 19 (12/05 0736) BP: (97-148)/(52-114) 148/108 (12/05 0736) SpO2:  [99 %-100 %] 99 % (12/05 0736) Last BM Date : 03/02/23 General:    white male in NAD Neurologic:  Alert and oriented Psych:  Cooperative. Normal mood and affect.  Intake/Output from previous day: 12/04 0701 - 12/05 0700 In: -  Out: 650 [Urine:650] Intake/Output this shift: No intake/output data recorded.  Lab Results: Recent Labs    03/01/23 1536 03/01/23 2352 03/02/23 1240 03/02/23 1940 03/02/23 2334  WBC 5.3  --   --   --   --   HGB 10.2*   < > 9.3* 9.9* 9.5*  HCT 29.6*   < > 27.2* 28.2* 26.4*  PLT 92*  --   --   --   --    < > = values in this interval not displayed.   BMET Recent Labs    03/01/23 1536 03/02/23 1940  NA 129* 131*  K 4.3 4.4  CL 92* 96*  CO2 26 31  GLUCOSE 190* 101*  BUN 38* 31*  CREATININE 1.39* 1.30*  CALCIUM 10.2 9.5   LFT Recent Labs    03/01/23 1536  PROT 6.2*  ALBUMIN 2.5*  AST 56*  ALT 12  ALKPHOS 94  BILITOT 1.1   PT/INR Recent Labs    03/01/23 1536  LABPROT 16.0*  INR 1.3*    Studies/Results: CT ABDOMEN PELVIS W CONTRAST  Result Date: 03/01/2023 CLINICAL DATA:  Abdominal pain. EXAM: CT ABDOMEN AND PELVIS WITH CONTRAST TECHNIQUE: Multidetector CT imaging of the abdomen and pelvis was performed using the standard protocol following bolus administration of intravenous contrast. RADIATION DOSE REDUCTION: This exam was performed according to the departmental dose-optimization program which includes automated exposure control, adjustment of the mA and/or kV according to  patient size and/or use of iterative reconstruction technique. CONTRAST:  75mL OMNIPAQUE IOHEXOL 350 MG/ML SOLN COMPARISON:  CT of the chest abdomen pelvis dated 04/01/2022. FINDINGS: Lower chest: Partially visualized small bilateral pleural effusions. There are consolidative changes of the visualized lower lobes which may represent atelectasis or infiltrate. No intra-abdominal free air or free fluid. Hepatobiliary: Cirrhosis.  No biliary dilatation.  Cholecystectomy. Pancreas: Unremarkable. No pancreatic ductal dilatation or surrounding inflammatory changes. Spleen: Splenomegaly measuring 16 cm in length. Adrenals/Urinary Tract: The adrenal glands unremarkable. Severe chronic left hydronephrosis and left renal parenchyma atrophy. There is a 5 mm nonobstructing stone in the interpolar left kidney. There is a 6 mm stone in the distal left ureter with moderate left hydroureter. Punctate nonobstructing right renal upper pole calculus. No hydronephrosis on the right. The right ureter is unremarkable. The urinary bladder is minimally distended. There is diffuse thickening of the anterior bladder wall with mild trabeculation which may be related to chronic bladder outlet obstruction. Correlation with urinalysis recommended to exclude UTI. Stomach/Bowel: Small hiatal hernia. Mild thickened appearance of the distal esophagus may represent esophagitis related to reflux. Clinical correlation recommended. There is no bowel obstruction. The appendix is normal. Vascular/Lymphatic: Mild aortoiliac atherosclerotic disease. The IVC is unremarkable. A TIPS is noted. The SMV, splenic vein,  and main portal vein appear patent. No portal venous gas. No adenopathy. Reproductive: The prostate and seminal vesicles are grossly unremarkable. No pelvic mass. Other: None Musculoskeletal: Osteopenia with degenerative changes. Age indeterminate, chronic appearing compression fracture of the inferior endplate of T12. No acute osseous pathology.  IMPRESSION: 1. A 6 mm stone in the distal left ureter with moderate left hydroureter. 2. Severe chronic left hydronephrosis and left renal parenchyma atrophy. 3. Nonobstructing bilateral renal calculi. No hydronephrosis on the right. 4. Cirrhosis with splenomegaly. 5. Partially visualized small bilateral pleural effusions with bibasilar atelectasis or infiltrate. 6.  Aortic Atherosclerosis (ICD10-I70.0). Electronically Signed   By: Elgie Collard M.D.   On: 03/01/2023 20:10   CT Head Wo Contrast  Result Date: 03/01/2023 CLINICAL DATA:  Altered mental status EXAM: CT HEAD WITHOUT CONTRAST TECHNIQUE: Contiguous axial images were obtained from the base of the skull through the vertex without intravenous contrast. RADIATION DOSE REDUCTION: This exam was performed according to the departmental dose-optimization program which includes automated exposure control, adjustment of the mA and/or kV according to patient size and/or use of iterative reconstruction technique. COMPARISON:  02/01/2023 FINDINGS: Brain: No evidence of acute infarction, hemorrhage, mass, mass effect, or midline shift. No hydrocephalus or extra-axial fluid collection. Basal ganglia calcifications. Age related cerebral atrophy. Periventricular white matter changes, likely the sequela of chronic small vessel ischemic disease. Vascular: No hyperdense vessel. Skull: Negative for fracture or focal lesion. Sinuses/Orbits: No acute finding. Status post bilateral lens replacements. Other: Trace fluid in the left mastoid air cells. IMPRESSION: No acute intracranial process. Electronically Signed   By: Wiliam Ke M.D.   On: 03/01/2023 19:21   DG Chest Portable 1 View  Result Date: 03/01/2023 CLINICAL DATA:  Weakness.  Altered mental status. EXAM: PORTABLE CHEST 1 VIEW COMPARISON:  01/12/2023. FINDINGS: Redemonstration of small-to-moderate left pleural effusion with associated compressive atelectatic changes in the left lung. There is trace right  pleural effusion, slightly decreased since the prior study. Bilateral lung fields are otherwise clear. No acute consolidation or lung collapse. No pneumothorax. No pulmonary edema. Stable cardio-mediastinal silhouette. No acute osseous abnormalities. The soft tissues are within normal limits. IMPRESSION: *Redemonstration of bilateral pleural effusions, left-greater-than-right. No acute consolidation or lung collapse. Electronically Signed   By: Jules Schick M.D.   On: 03/01/2023 16:02       Assessment / Plan:    72 y/o male here with the following:  Hepatic encephalopathy s/p TIPS Decompensated MASH cirrhosis History of melena / anemia  Patient's mental status is improved this morning. He seems closer to baseline. He has had a few BMs since lactulose enema, brown stool per nursing. Wife / family endorsed some darker black stools in recent days. With this along with mild worsening of anemia, I offered them an EGD to reassess his upper tract. He does have a history of extremely large varices with bleeding, s/p TIPS, hopefully they have not recurred. I spoke with his son this morning to see if he felt comfortable proceeding with the EGD today and they do given his clinical improvement, they would like to have it done given his recent bleeding symptoms. We have discussed risks / benefits and they want to proceed. I am awaiting his AM lab draw, assuming things are stable we will plan on an EGD later this morning. Otherwise continue oral lactulose to goal of 3 BMs per day and Rifaximin.   PLAN: - continue lactulose / rifaximin, can give additional lactulose enema if needed - follow up  AM labs - EGD scheduled for later this AM  Call with questions in the interim.  Harlin Rain, MD [facility]

## 2023-03-03 NOTE — Anesthesia Preprocedure Evaluation (Addendum)
Anesthesia Evaluation  Patient identified by MRN, date of birth, ID band Patient awake    Reviewed: Allergy & Precautions, NPO status , Patient's Chart, lab work & pertinent test results  Airway Mallampati: III       Dental no notable dental hx.    Pulmonary asthma , sleep apnea , COPD,  COPD inhaler and oxygen dependent   Pulmonary exam normal        Cardiovascular hypertension, Normal cardiovascular exam+ dysrhythmias Atrial Fibrillation      Neuro/Psych  PSYCHIATRIC DISORDERS Anxiety Depression    Parkinsonism   Neuromuscular disease CVA    GI/Hepatic ,GERD  ,,(+) Cirrhosis   Esophageal Varices    , Hepatitis -  Endo/Other  diabetes, Insulin Dependent    Renal/GU Renal disease     Musculoskeletal  (+) Arthritis ,  Wheelchair   Abdominal   Peds  Hematology  (+) Blood dyscrasia, anemia , REFUSES BLOOD PRODUCTSThrombocytopenia    Anesthesia Other Findings Anemia Cirrhosis Esophageal varices  Reproductive/Obstetrics                             Anesthesia Physical Anesthesia Plan  ASA: 4  Anesthesia Plan: MAC   Post-op Pain Management:    Induction:   PONV Risk Score and Plan: 1 and Propofol infusion and Treatment may vary due to age or medical condition  Airway Management Planned:   Additional Equipment:   Intra-op Plan:   Post-operative Plan:   Informed Consent: I have reviewed the patients History and Physical, chart, labs and discussed the procedure including the risks, benefits and alternatives for the proposed anesthesia with the patient or authorized representative who has indicated his/her understanding and acceptance.     Dental advisory given and Consent reviewed with POA  Plan Discussed with: CRNA  Anesthesia Plan Comments: (Anesthetic plan discussed with patient and son)       Anesthesia Quick Evaluation

## 2023-03-03 NOTE — Anesthesia Postprocedure Evaluation (Signed)
Anesthesia Post Note  Patient: Corey Wood  Procedure(s) Performed: ESOPHAGOGASTRODUODENOSCOPY (EGD) WITH PROPOFOL BIOPSY     Patient location during evaluation: Endoscopy Anesthesia Type: MAC Level of consciousness: awake Pain management: pain level controlled Vital Signs Assessment: post-procedure vital signs reviewed and stable Respiratory status: spontaneous breathing, nonlabored ventilation and respiratory function stable Cardiovascular status: blood pressure returned to baseline and stable Postop Assessment: no apparent nausea or vomiting Anesthetic complications: no   No notable events documented.  Last Vitals:  Vitals:   03/03/23 1150 03/03/23 1202  BP: 127/77 128/69  Pulse: 67 77  Resp: (!) 21 20  Temp:  36.6 C  SpO2: 100% 99%    Last Pain:  Vitals:   03/03/23 1202  TempSrc: Oral  PainSc:                  Coyle Stordahl P Brockton Mckesson

## 2023-03-03 NOTE — Progress Notes (Signed)
  Patient nurse reported that patient has altered mentation due to hepatic encephalopathy and hyperammonemia.  Patient pulled off the IV access.  Cannot give IV Protonix tonight.  Giving oral Protonix 80 mg for tonight.  We ordered Haldol 1 mg IM every 6 hour as needed for agitation.  Once patient will be more calm need to place the IV access again. Continue delirium precaution and fall precaution.

## 2023-03-03 NOTE — Evaluation (Signed)
Occupational Therapy Evaluation Patient Details Name: Corey Wood MRN: [id number] DOB: [birth year] Today's Date: 03/03/2023   History of Present Illness Pt is a 72 yo male admitted 12/3 for GIB, hepatic encephalopathy and pancytopenia. PMH: COPD, anxiety, depression, T2DM, HLD, HTN, hypothyroidism, CVA, CKD, GERD, Parkinson's, NASH   Clinical Impression   PTA, pt lives with spouse, typically requires assistance for transfers and extensive assist for ADLs. Pt's wife and a friend assist pt during the day and sons/nephew alternate staying to assist at night. Pt presents now close to baseline for ADLs though requiring increased assist for transfers. Pt required Mod A (+2 helpful for safety) to use Stedy for [facility] and back to bed transfers today. Son feels having a Stedy/standing frame for transfers would maximize pt and wife safety with transfers at home during the day. As pt close to baseline for ADLs, likely no OT follow up needed at DC.      If plan is discharge home, recommend the following: A lot of help with walking and/or transfers;Two people to help with walking and/or transfers;A lot of help with bathing/dressing/bathroom;Two people to help with bathing/dressing/bathroom    Functional Status Assessment  Patient has had a recent decline in their functional status and demonstrates the ability to make significant improvements in function in a reasonable and predictable amount of time.  Equipment Recommendations  Other (comment) (Standing frame)    Recommendations for Other Services       Precautions / Restrictions Precautions Precautions: Fall Precaution Comments: flexiseal, 3 L O2 at baseline Restrictions Weight Bearing Restrictions: No      Mobility Bed Mobility Overal bed mobility: Needs Assistance Bed Mobility: Sit to Supine       Sit to supine: Max assist        Transfers Overall transfer level: Needs assistance Equipment used: Ambulation equipment  used Transfers: Sit to/from Stand Sit to Stand: Mod assist, +2 physical assistance, +2 safety/equipment           General transfer comment: Able to stand from recliner and BSC with Stedy and Mod A . +2 for safety from [facility] due to fatigue and need to stand for hygiene. Line mgmt assist needed during [place] transfer Transfer via Lift Equipment: [employer] Overall balance assessment: Needs assistance Sitting-balance support: Feet supported, Bilateral upper extremity supported Sitting balance-Leahy Scale: Fair     Standing balance support: Reliant on assistive device for balance, Bilateral upper extremity supported Standing balance-Leahy Scale: Zero                             ADL either performed or assessed with clinical judgement   ADL Overall ADL's : Needs assistance/impaired Eating/Feeding: NPO   Grooming: Minimal assistance;Sitting   Upper Body Bathing: Maximal assistance;Sitting   Lower Body Bathing: Total assistance;+2 for physical assistance;+2 for safety/equipment;Sit to/from stand;Sitting/lateral leans   Upper Body Dressing : Maximal assistance;Sitting   Lower Body Dressing: Total assistance;+2 for physical assistance;+2 for safety/equipment;Sitting/lateral leans;Sit to/from Market researcher Details (indicate cue type and reason): Use of Stedy for recliner to [facility] transfer. Mod A x 2 for safety to stand from [place] and sustain standing for hygiene Toileting- Clothing Manipulation and Hygiene: Total assistance;+2 for physical assistance;+2 for safety/equipment;Sitting/lateral lean;Sit to/from stand               Vision Ability to See in Adequate Light: 0 Adequate Patient Visual Report: No change  from baseline Vision Assessment?: No apparent visual deficits     Perception         Praxis         Pertinent Vitals/Pain Pain Assessment Pain Assessment: No/denies pain     Extremity/Trunk Assessment Upper Extremity Assessment Upper  Extremity Assessment: Generalized weakness;Right hand dominant   Lower Extremity Assessment Lower Extremity Assessment: Defer to PT evaluation   Cervical / Trunk Assessment Cervical / Trunk Assessment: Kyphotic   Communication Communication Communication: Difficulty following commands/understanding;Difficulty communicating thoughts/reduced clarity of speech Following commands: Follows one step commands with increased time Cueing Techniques: Verbal cues;Gestural cues;Tactile cues   Cognition Arousal: Alert Behavior During Therapy: WFL for tasks assessed/performed, Flat affect Overall Cognitive Status: Impaired/Different from baseline Area of Impairment: Memory, Awareness, Safety/judgement, Problem solving                     Memory: Decreased short-term memory   Safety/Judgement: Decreased awareness of safety Awareness: Emergent Problem Solving: Slow processing, Decreased initiation, Difficulty sequencing, Requires verbal cues, Requires tactile cues General Comments: memory deficits recalling PLOF, slower processing to follow commands. hx of Parkinson's. Pt son reports pt more alert today as he was mostly nonverbal yesterday     General Comments  Son at bedside    Exercises     Shoulder Instructions      Home Living Family/patient expects to be discharged to:: Private residence Living Arrangements: Spouse/significant other Available Help at Discharge: Family;Available 24 hours/day;Personal care attendant Type of Home: House Home Access: Ramped entrance     Home Layout: One level     Bathroom Shower/Tub: Producer, television/film/video: Standard Bathroom Accessibility: Yes   Home Equipment: Agricultural consultant (2 wheels);Standard Walker;Rollator (4 wheels);BSC/3in1;Wheelchair - manual;Electric scooter;Hospital bed;Grab bars - tub/shower;Grab bars - toilet;Shower seat - built in;Lift chair   Additional Comments: wife is primary caregiver, has a home nurse that  lives nextdoor and family friend that assists during the day. Sons and nephews available to assist in the evening, rotating who stays the night      Prior Functioning/Environment Prior Level of Function : Needs assist;History of Falls (last six months)             Mobility Comments: max assist for transfers, shuffles a few feet with son assist. During day wife and friend assist movement. WC for any distance, hx of falls. per son, pt often confused at night, attempting to get out of bed and undress self ADLs Comments: wears adult briefs, max assist for bathing and dressing. able to self feed and groom self.        OT Problem List: Decreased strength;Decreased activity tolerance;Decreased cognition;Decreased safety awareness;Decreased knowledge of use of DME or AE      OT Treatment/Interventions: Self-care/ADL training;Therapeutic exercise;Energy conservation;DME and/or AE instruction;Therapeutic activities;Balance training;Patient/family education    OT Goals(Current goals can be found in the care plan section) Acute Rehab OT Goals Patient Stated Goal: son feels standing frame/Stedy at home would be helpful and able to manage without need for SNF rehab OT Goal Formulation: With patient/family Time For Goal Achievement: 03/17/23 Potential to Achieve Goals: Good  OT Frequency: Min 1X/week    Co-evaluation              AM-PAC OT "6 Clicks" Daily Activity     Outcome Measure Help from another person eating meals?: A Little Help from another person taking care of personal grooming?: A Little Help from another person toileting, which includes using  toliet, bedpan, or urinal?: Total Help from another person bathing (including washing, rinsing, drying)?: A Lot Help from another person to put on and taking off regular upper body clothing?: A Lot Help from another person to put on and taking off regular lower body clothing?: Total 6 Click Score: 12   End of Session Equipment  Utilized During Treatment: Gait belt Nurse Communication: Mobility status  Activity Tolerance: Patient tolerated treatment well;Patient limited by fatigue Patient left: in bed;Other (comment) (transport in room to take to endo)  OT Visit Diagnosis: Unsteadiness on feet (R26.81);Other abnormalities of gait and mobility (R26.89);Muscle weakness (generalized) (M62.81)                Time: 1610-9604 OT Time Calculation (min): 40 min Charges:  OT General Charges $OT Visit: 1 Visit OT Evaluation $OT Eval Moderate Complexity: 1 Mod OT Treatments $Self Care/Home Management : 23-37 mins  Bradd Canary, OTR/L Acute Rehab Services Office: [phone]   Lorre Munroe 03/03/2023, 11:04 AM

## 2023-03-03 NOTE — Progress Notes (Signed)
PROGRESS NOTE  Corey Wood:[id number] DOB: [birth year] DOA: 03/01/2023 PCP: [facility], [facility]  HPI/Recap of past 14 hours: 72 year old male with past medical history of COPD, anxiety depression, T2DM, HLD, HTN, hypothyroidism, neuromuscular disorder, OSA, chronic oxygen dependency, history of stroke, cirrhosis secondary to NASH, history of variceal bleeding, hepatic hydrothorax, portal hypertensive, gastritis intolerant to beta-blockade due to bradycardia and hypotension.  He is sp EGD with banding and TIPS. Presents to the ED due to dark stools/melena with confusion X 2 days PTA. In the ER patient noted borderline hypotension.  Vitals otherwise stable.  Patient's occult stool positive.  Sodium 129, albumin 2.5, ammonia 67, hemoglobin 10.2, previous hemoglobin 12.5 in 11/2022], platelets 92 [chronic], INR 1.3, CXR redemonstrates B/L pleural effusions L>R. GI contacted by EDP recommended Protonix and octreotide infusions.  Patient admitted for further management.    Today, patient was noted to be more awake, appears very deconditioned.  Received rectal enema with BM overnight and this morning.    Assessment/Plan: Principal Problem:   GI bleeding Active Problems:   Encephalopathy, hepatic (HCC)   Other cirrhosis of liver (HCC)   Type 2 diabetes mellitus with stage 3a chronic kidney disease, with long-term current use of insulin (HCC)   Chronic kidney disease, stage 3a (HCC)   Parkinson's disease (HCC)   Chronic obstructive pulmonary disease (HCC)   Bleeding esophageal varices (HCC)   Anemia   Portal hypertension (HCC)   S/P TIPS (transjugular intrahepatic portosystemic shunt)   GI bleed   Melena   Acute esophagitis   Possible GI bleeding FOBT positive Acute on chronic anemia No further reports of melena GI on board, s/p EGD, which showed severe esophagitis with no bleeding, previously noted large varices were decompressed not visualized post TIPS, no gastric varices,  nonbleeding duodenal ulcer with a clean ulcer base Recommend Protonix, liquid Carafate  Hypotension BP improved Continue midodrine Held home Lasix, Aldactone for now  Hyponatremia Improved Possibly 2/2 poor oral intake versus cirrhosis S/p IV fluids x 1 day Daily CMP  Hepatic encephalopathy s/p TIPS Negative head CT Ammonia level 67--> 45 Continue rifaximin twice daily, lactulose Daily CMP, ammonia  Pancytopenia Liver cirrhosis secondary to NASH/chronic thrombocytopenia Portal hypertension Continue midodrine 20 mg p.o. 3 times daily resumed  Held Lasix, Aldactone for now   T2DM Last A1c 6.5 Sliding scale insulin Holding lantus for now   Chronic obstructive pulmonary disease Chronic respiratory failure  On 3L of O2 at basline Nebulizers as needed.  Flonase resumed  Chronic kidney disease, stage 3a Stable at baseline  Parkinson's disease Sinemet resumed  Physical deconditioning Now mostly in the [facility] d/t weakness PT/OT  GOC discussion Palliative/hospice care consulted- switched to DNR        Estimated body mass index is 26.31 kg/m as calculated from the following:   Height as of this encounter: 5\' 7"  (1.702 m).   Weight as of this encounter: 76.2 kg.     Code Status: DNR  Family Communication: Son at bedside  Disposition Plan: Status is: Inpatient Remains inpatient appropriate because: Level of care      Consultants: GI  Procedures: None   Antimicrobials: None   DVT prophylaxis:  SCDs   Objective: Vitals:   03/03/23 1140 03/03/23 1150 03/03/23 1202 03/03/23 1619  BP: 126/77 127/77 128/69   Pulse: 77 67 77   Resp: (!) 27 (!) 21 20   Temp:   97.8 F (36.6 C) 97.7 F (36.5 C)  TempSrc:   Oral Oral  SpO2: 100% 100% 99%   Weight:      Height:        Intake/Output Summary (Last 24 hours) at 03/03/2023 1927 Last data filed at 03/03/2023 1619 Gross per 24 hour  Intake --  Output 1050 ml  Net -1050 ml   Filed Weights    03/01/23 1448  Weight: 76.2 kg    Exam: General: NAD, deconditioned  Cardiovascular: S1, S2 present Respiratory: CTAB Abdomen: Soft, nontender, nondistended, bowel sounds present Musculoskeletal: No bilateral pedal edema noted. Multiple scabs noted on BLE Skin: As noted above Psychiatry: Fair mood    Data Reviewed: CBC: Recent Labs  Lab 03/01/23 1536 03/01/23 2352 03/02/23 0448 03/02/23 1240 03/02/23 1940 03/02/23 2334 03/03/23 0857  WBC 5.3  --   --   --   --   --  4.5  NEUTROABS  --   --   --   --   --   --  3.5  HGB 10.2*   < > 8.6* 9.3* 9.9* 9.5* 10.4*  HCT 29.6*   < > 25.3* 27.2* 28.2* 26.4* 30.1*  MCV 101.7*  --   --   --   --   --  102.7*  PLT 92*  --   --   --   --   --  99*   < > = values in this interval not displayed.   Basic Metabolic Panel: Recent Labs  Lab 03/01/23 1536 03/02/23 1940 03/03/23 0857  NA 129* 131* 136  K 4.3 4.4 4.0  CL 92* 96* 99  CO2 26 31 28   GLUCOSE 190* 101* 120*  BUN 38* 31* 31*  CREATININE 1.39* 1.30* 1.33*  CALCIUM 10.2 9.5 9.7   GFR: Estimated Creatinine Clearance: 45.6 mL/min (A) (by C-G formula based on SCr of 1.33 mg/dL (H)). Liver Function Tests: Recent Labs  Lab 03/01/23 1536 03/03/23 0857  AST 56* 69*  ALT 12 8  ALKPHOS 94 86  BILITOT 1.1 1.6*  PROT 6.2* 6.8  ALBUMIN 2.5* 2.7*   Recent Labs  Lab 03/01/23 1636  LIPASE 25   Recent Labs  Lab 03/01/23 1536 03/03/23 0857  AMMONIA 67* 45*   Coagulation Profile: Recent Labs  Lab 03/01/23 1536 03/03/23 0857  INR 1.3* 1.1   Cardiac Enzymes: No results for input(s): "CKTOTAL", "CKMB", "CKMBINDEX", "TROPONINI" in the last 168 hours. BNP (last 3 results) No results for input(s): "PROBNP" in the last 8760 hours. HbA1C: Recent Labs    03/02/23 0129  HGBA1C 6.5*   CBG: Recent Labs  Lab 03/02/23 2316 03/03/23 0338 03/03/23 0736 03/03/23 1210 03/03/23 1618  GLUCAP 101* 107* 107* 142* 205*   Lipid Profile: No results for input(s): "CHOL",  "HDL", "LDLCALC", "TRIG", "CHOLHDL", "LDLDIRECT" in the last 72 hours. Thyroid Function Tests: No results for input(s): "TSH", "T4TOTAL", "FREET4", "T3FREE", "THYROIDAB" in the last 72 hours. Anemia Panel: No results for input(s): "VITAMINB12", "FOLATE", "FERRITIN", "TIBC", "IRON", "RETICCTPCT" in the last 72 hours. Urine analysis:    Component Value Date/Time   COLORURINE YELLOW 03/01/2023 2053   APPEARANCEUR CLEAR 03/01/2023 2053   LABSPEC 1.026 03/01/2023 2053   PHURINE 5.0 03/01/2023 2053   GLUCOSEU NEGATIVE 03/01/2023 2053   HGBUR NEGATIVE 03/01/2023 2053   BILIRUBINUR NEGATIVE 03/01/2023 2053   KETONESUR NEGATIVE 03/01/2023 2053   PROTEINUR NEGATIVE 03/01/2023 2053   NITRITE NEGATIVE 03/01/2023 2053   LEUKOCYTESUR NEGATIVE 03/01/2023 2053   Sepsis Labs: @LABRCNTIP (procalcitonin:4,lacticidven:4)  )No results found for this or any previous visit (from the past  240 hour(s)).    Studies: No results found.  Scheduled Meds:  carbidopa-levodopa  2 tablet Oral TID WC   Gerhardt's butt cream   Topical BID   guaiFENesin-codeine  5 mL Oral QHS   insulin aspart  0-9 Units Subcutaneous Q4H   lactulose  10 g Oral TID   midodrine  20 mg Oral TID WC   pantoprazole (PROTONIX) IV  40 mg Intravenous Q12H   rifaximin  550 mg Oral BID   sucralfate  1 g Oral TID   tamsulosin  0.4 mg Oral Daily    Continuous Infusions:     LOS: 1 day     Briant Cedar, MD [facility]  If 7PM-7AM, please contact night-coverage [email or web address] 03/03/2023, 7:27 PM

## 2023-03-03 NOTE — Op Note (Addendum)
[facility] Patient Name: Corey Wood Procedure Date : 03/03/2023 MRN: [id number] Attending MD: Willaim Rayas. Adela Lank , MD, [id number] Date of Birth: [birth year] CSN: [id number] Age: 72 Admit Type: Inpatient Procedure:                Upper GI endoscopy Indications:              Melena - history of cirrhosis with large varices                            s/p TIPS. Intermittent dark stools / anemia.                            Patient takes protonix 40mg  daily as outpatient. Providers:                Willaim Rayas. Adela Lank, MD, Salley Scarlet,                            Technician, Stephens Shire RN, RN, christopher R, CRNA Referring MD:              Medicines:                Monitored Anesthesia Care Complications:            No immediate complications. Estimated blood loss:                            Minimal. Estimated Blood Loss:     Estimated blood loss was minimal. Procedure:                Pre-Anesthesia Assessment:                           - Prior to the procedure, a History and Physical                            was performed, and patient medications and                            allergies were reviewed. The patient's tolerance of                            previous anesthesia was also reviewed. The risks                            and benefits of the procedure and the sedation                            options and risks were discussed with the patient.                            All questions were answered, and informed consent                            was obtained. Prior Anticoagulants: The patient has  taken no anticoagulant or antiplatelet agents. ASA                            Grade Assessment: IV - A patient with severe                            systemic disease that is a constant threat to life.                            After reviewing the risks and benefits, the patient                            was deemed in satisfactory  condition to undergo the                            procedure.                           After obtaining informed consent, the endoscope was                            passed under direct vision. Throughout the                            procedure, the patient's blood pressure, pulse, and                            oxygen saturations were monitored continuously. The                            [id number]-H190 ([id number]) Olympus endoscope was introduced                            through the mouth, and advanced to the second part                            of duodenum. The upper GI endoscopy was                            accomplished without difficulty. The patient                            tolerated the procedure well. Scope In: Scope Out: Findings:      Severe esophagitis with diffuse superficial ulceration and no bleeding       was found. It started at 20cm from the incisors and progressed diffusely       / distally to the GEJ, difficult to visualize clear z line in this       light. Using pediatric forceps, one diminitive biopsy was taken at the       proximal aspect of the esophagitis / ulceration with a cold forceps for       histology to rule out viral etiologies.      The exam of the esophagus was otherwise normal. Previously noted varices  were not visualized / decompressed s/p TIPS.      The entire examined stomach was normal. No gastric varices.      One non-bleeding superficial duodenal ulcer with a clean ulcer base       (Forrest Class III) was found in the duodenal bulb. The lesion was 3 mm       in largest dimension.      The exam of the duodenum was otherwise normal. Impression:               - Severe esophagitis with no bleeding. Biopsied as                            above to rule out viral etiologies.                           - Previously noted large varices are decompressed /                            not well visualized post TIPS                           - Normal  stomach. No gastric varices.                           - Normal stomach otherwise.                           - Non-bleeding duodenal ulcer with a clean ulcer                            base (Forrest Class III).                           - Normal duodenum otherwise.                           Suspect mild bleeding / anemia from severe                            esophagitis. Recommendation:           - Return patient to hospital ward for ongoing care.                           - Resume previous diet.                           - Continue present medications.                           - Increase protonix to 40mg  twice daily                           - Add liquid carafate 10cc three times daily for 2                            weeks                           -  Await pathology results.                           - Sleep with head of bed elevated                           - GI service will follow. I discussed results with                            the patient's son Procedure Code(s):        --- Professional ---                           [phone], Esophagogastroduodenoscopy, flexible,                            transoral; with biopsy, single or multiple Diagnosis Code(s):        --- Professional ---                           K20.90, Esophagitis, unspecified without bleeding                           K26.9, Duodenal ulcer, unspecified as acute or                            chronic, without hemorrhage or perforation                           K92.1, Melena (includes Hematochezia) CPT copyright 2022 American Medical Association. All rights reserved. The codes documented in this report are preliminary and upon coder review may  be revised to meet current compliance requirements. Viviann Spare P. Xianna Siverling, MD 03/03/2023 11:34:08 AM This report has been signed electronically. Number of Addenda: 0

## 2023-03-03 NOTE — Transfer of Care (Signed)
Immediate Anesthesia Transfer of Care Note  Patient: Corey Wood  Procedure(s) Performed: ESOPHAGOGASTRODUODENOSCOPY (EGD) WITH PROPOFOL BIOPSY  Patient Location: Endoscopy Unit  Anesthesia Type:MAC  Level of Consciousness: sedated  Airway & Oxygen Therapy: Patient Spontanous Breathing and Patient connected to nasal cannula oxygen  Post-op Assessment: Report given to RN and Post -op Vital signs reviewed and stable  Post vital signs: Reviewed and stable  Last Vitals:  Vitals Value Taken Time  BP    Temp    Pulse 66 03/03/23 1132  Resp 25 03/03/23 1132  SpO2 98 % 03/03/23 1132  Vitals shown include unfiled device data.  Last Pain:  Vitals:   03/03/23 1002  TempSrc: Temporal  PainSc: 0-No pain         Complications: No notable events documented.

## 2023-03-04 DIAGNOSIS — K3189 Other diseases of stomach and duodenum: Secondary | ICD-10-CM | POA: Diagnosis not present

## 2023-03-04 DIAGNOSIS — K921 Melena: Secondary | ICD-10-CM | POA: Diagnosis not present

## 2023-03-04 DIAGNOSIS — K7682 Hepatic encephalopathy: Secondary | ICD-10-CM

## 2023-03-04 DIAGNOSIS — D649 Anemia, unspecified: Secondary | ICD-10-CM

## 2023-03-04 DIAGNOSIS — K7469 Other cirrhosis of liver: Secondary | ICD-10-CM | POA: Diagnosis not present

## 2023-03-04 DIAGNOSIS — K7460 Unspecified cirrhosis of liver: Secondary | ICD-10-CM | POA: Diagnosis not present

## 2023-03-04 DIAGNOSIS — K922 Gastrointestinal hemorrhage, unspecified: Secondary | ICD-10-CM | POA: Diagnosis not present

## 2023-03-04 DIAGNOSIS — R188 Other ascites: Secondary | ICD-10-CM | POA: Diagnosis not present

## 2023-03-04 LAB — COMPREHENSIVE METABOLIC PANEL
ALT: 10 U/L (ref 0–44)
AST: 58 U/L — ABNORMAL HIGH (ref 15–41)
Albumin: 2.4 g/dL — ABNORMAL LOW (ref 3.5–5.0)
Alkaline Phosphatase: 90 U/L (ref 38–126)
Anion gap: 5 (ref 5–15)
BUN: 25 mg/dL — ABNORMAL HIGH (ref 8–23)
CO2: 31 mmol/L (ref 22–32)
Calcium: 9.3 mg/dL (ref 8.9–10.3)
Chloride: 96 mmol/L — ABNORMAL LOW (ref 98–111)
Creatinine, Ser: 1.51 mg/dL — ABNORMAL HIGH (ref 0.61–1.24)
GFR, Estimated: 48 mL/min — ABNORMAL LOW (ref >60)
Glucose, Bld: 159 mg/dL — ABNORMAL HIGH (ref 70–99)
Potassium: 4 mmol/L (ref 3.5–5.1)
Sodium: 132 mmol/L — ABNORMAL LOW (ref 135–145)
Total Bilirubin: 1.2 mg/dL — ABNORMAL HIGH (ref <1.2)
Total Protein: 6 g/dL — ABNORMAL LOW (ref 6.5–8.1)

## 2023-03-04 LAB — CBC WITH DIFFERENTIAL/PLATELET
Abs Immature Granulocytes: 0.07 10*3/uL (ref 0.00–0.07)
Basophils Absolute: 0 10*3/uL (ref 0.0–0.1)
Basophils Relative: 1 %
Eosinophils Absolute: 0.1 10*3/uL (ref 0.0–0.5)
Eosinophils Relative: 3 %
HCT: 27.3 % — ABNORMAL LOW (ref 39.0–52.0)
Hemoglobin: 9.7 g/dL — ABNORMAL LOW (ref 13.0–17.0)
Immature Granulocytes: 2 %
Lymphocytes Relative: 12 %
Lymphs Abs: 0.5 10*3/uL — ABNORMAL LOW (ref 0.7–4.0)
MCH: 35.4 pg — ABNORMAL HIGH (ref 26.0–34.0)
MCHC: 35.5 g/dL (ref 30.0–36.0)
MCV: 99.6 fL (ref 80.0–100.0)
Monocytes Absolute: 0.3 10*3/uL (ref 0.1–1.0)
Monocytes Relative: 7 %
Neutro Abs: 3 10*3/uL (ref 1.7–7.7)
Neutrophils Relative %: 75 %
Platelets: 89 10*3/uL — ABNORMAL LOW (ref 150–400)
RBC: 2.74 MIL/uL — ABNORMAL LOW (ref 4.22–5.81)
RDW: 14.9 % (ref 11.5–15.5)
WBC: 4 10*3/uL (ref 4.0–10.5)
nRBC: 0 % (ref 0.0–0.2)

## 2023-03-04 LAB — GLUCOSE, CAPILLARY
Glucose-Capillary: 175 mg/dL — ABNORMAL HIGH (ref 70–99)
Glucose-Capillary: 223 mg/dL — ABNORMAL HIGH (ref 70–99)
Glucose-Capillary: 191 mg/dL — ABNORMAL HIGH (ref 70–99)
Glucose-Capillary: 139 mg/dL — ABNORMAL HIGH (ref 70–99)

## 2023-03-04 LAB — AMMONIA: Ammonia: 43 umol/L — ABNORMAL HIGH (ref 9–35)

## 2023-03-04 LAB — PROTIME-INR
INR: 1.2 (ref 0.8–1.2)
Prothrombin Time: 15.5 s — ABNORMAL HIGH (ref 11.4–15.2)

## 2023-03-04 MED ORDER — LACTULOSE ENEMA
300.0000 mL | Freq: Once | ORAL | Status: AC
  Administered 2023-03-04: 300 mL via RECTAL
  Filled 2023-03-04: qty 300

## 2023-03-04 MED ORDER — LACTULOSE 10 GM/15ML PO SOLN
20.0000 g | Freq: Three times a day (TID) | ORAL | Status: DC
  Administered 2023-03-04 – 2023-03-05 (×3): 20 g via ORAL
  Filled 2023-03-04 (×3): qty 30

## 2023-03-04 NOTE — Progress Notes (Signed)
      Progress Note   Subjective  Awake today but a bit more drowsy, per nursing no BM overnight or today, seems like the last one was yesterday AM. He is taking his laculose. Son at bedside. EGD done yesterday.     Objective   Vital signs in last 24 hours: Temp:  [97.7 F (36.5 C)-98.8 F (37.1 C)] 97.7 F (36.5 C) (12/06 1141) Pulse Rate:  [63-81] 63 (12/06 1141) Resp:  [20-22] 20 (12/06 1141) BP: (112-138)/(50-84) 138/83 (12/06 1141) SpO2:  [97 %-100 %] 98 % (12/06 1141) Last BM Date : 03/03/23 General:    white male in NAD Neurologic:  Alert and oriented, but drowsi Psych:  Cooperative. Normal mood and affect.  Intake/Output from previous day: 12/05 0701 - 12/06 0700 In: -  Out: 1750 [Urine:1450; Stool:300] Intake/Output this shift: Total I/O In: 200 [P.O.:200] Out: 600 [Urine:600]  Lab Results: Recent Labs    03/01/23 1536 03/01/23 2352 03/02/23 2334 03/03/23 0857 03/04/23 0328  WBC 5.3  --   --  4.5 4.0  HGB 10.2*   < > 9.5* 10.4* 9.7*  HCT 29.6*   < > 26.4* 30.1* 27.3*  PLT 92*  --   --  99* 89*   < > = values in this interval not displayed.   BMET Recent Labs    03/02/23 1940 03/03/23 0857 03/04/23 0328  NA 131* 136 132*  K 4.4 4.0 4.0  CL 96* 99 96*  CO2 31 28 31   GLUCOSE 101* 120* 159*  BUN 31* 31* 25*  CREATININE 1.30* 1.33* 1.51*  CALCIUM 9.5 9.7 9.3   LFT Recent Labs    03/04/23 0328  PROT 6.0*  ALBUMIN 2.4*  AST 58*  ALT 10  ALKPHOS 90  BILITOT 1.2*   PT/INR Recent Labs    03/03/23 0857 03/04/23 0328  LABPROT 14.8 15.5*  INR 1.1 1.2    Studies/Results: No results found.     Assessment / Plan:    72 y/o male here with the following:  Hepatic encephalopathy s/p TIPS Decompensated MASH cirrhosis History of melena / anemia   Mental status improved from admission but a bit more drowsi this AM compared to yesterday. He has been constipated and not moving his bowels as well despite regular lactulose dosing. Will  give another lactulose enema today and increase dosing of oral lactulose for now, titrate to 3 BMs per day. Will keep him here today and if he improves with the regimen can go home tomorrow. I see that family would like hospice services at home which I think is very reasonable.  Otherwise, EGD done yesterday for dark stools. He had pretty severe esophagitis / esophageal ulceration, biopsy taken to rule out viral etiologies. Increased protonix to 40mg  BID and add liquid carafate every 8 hours for the next week or so to help heal this. Fortunately his varices have been eradicated post TIPS. He has not had any bleeding while he has been here.   Will await his course today, assess for possible discharge home tomorrow.  PLAN: - lactulose enema today - increase oral lactulose dosing - titrate to 3 BMs per day - continue rifaximin - protonix 40mg  BID - liquid carafate TID  Call with questions.  Harlin Rain, MD [facility]

## 2023-03-04 NOTE — TOC Progression Note (Signed)
Transition of Care (TOC) - Progression Note  Donn Pierini RN, BSN Transitions of Care Unit 4E- RN Case Manager See Treatment Team for direct phone #   Patient Details  Name: Corey Wood MRN: [id number] Date of Birth: 1950-09-23  Transition of Care [facility]) CM/SW Contact  Zenda Alpers, [facility]ink, RN Phone Number: 03/04/2023, 3:29 PM  Clinical Narrative:    Notified by [facility] that pt has been approved for Hospice in the home- they will follow up tomorrow regarding plan with family.    Expected Discharge Plan: Home w Hospice Care Barriers to Discharge: Continued Medical Work up  Expected Discharge Plan and Services   Discharge Planning Services: CM Consult Post Acute Care Choice: Hospice Living arrangements for the past 2 months: Single Family Home                             [facility] Agency: [facility] Date [facility] Agency Contacted: 03/03/23 Time HH Agency Contacted: [phone] Representative spoke with at [facility] Agency: [facility]   Social Determinants of Health (SDOH) Interventions SDOH Screenings   Food Insecurity: No Food Insecurity (03/03/2023)  Housing: Patient Unable To Answer (03/03/2023)  Transportation Needs: Patient Unable To Answer (03/03/2023)  Utilities: Patient Unable To Answer (03/03/2023)  Social Connections: Unknown (08/10/2021)   Received from [facility]  Tobacco Use: Low Risk  (03/03/2023)    Readmission Risk Interventions     No data to display

## 2023-03-04 NOTE — Progress Notes (Signed)
PROGRESS NOTE  Corey Wood JXB:[id number] DOB: Feb 22, 1951 DOA: 03/01/2023 PCP: [facility]  HPI/Recap of past 20 hours: 72 year old male with past medical history of COPD, anxiety depression, T2DM, HLD, HTN, hypothyroidism, neuromuscular disorder, OSA, chronic oxygen dependency, history of stroke, cirrhosis secondary to NASH, history of variceal bleeding, hepatic hydrothorax, portal hypertensive, gastritis intolerant to beta-blockade due to bradycardia and hypotension.  He is sp EGD with banding and TIPS. Presents to the ED due to dark stools/melena with confusion X 2 days PTA. In the ER patient noted borderline hypotension.  Vitals otherwise stable.  Patient's occult stool positive.  Sodium 129, albumin 2.5, ammonia 67, hemoglobin 10.2, previous hemoglobin 12.5 in 11/2022], platelets 92 [chronic], INR 1.3, CXR redemonstrates B/L pleural effusions L>R. GI contacted by EDP recommended Protonix and octreotide infusions.  Patient admitted for further management.    Today, patient noted to be sleepy, denies any new complaints.  Son at bedside.    Assessment/Plan: Principal Problem:   GI bleeding Active Problems:   Encephalopathy, hepatic (HCC)   Other cirrhosis of liver (HCC)   Type 2 diabetes mellitus with stage 3a chronic kidney disease, with long-term current use of insulin (HCC)   Chronic kidney disease, stage 3a (HCC)   Parkinson's disease (HCC)   Chronic obstructive pulmonary disease (HCC)   Bleeding esophageal varices (HCC)   Anemia   Portal hypertension (HCC)   S/P TIPS (transjugular intrahepatic portosystemic shunt)   GI bleed   Melena   Acute esophagitis   Possible GI bleeding FOBT positive Acute on chronic anemia No further reports of melena GI on board, s/p EGD, which showed severe esophagitis with no bleeding, previously noted large varices were decompressed not visualized post TIPS, no gastric varices, nonbleeding duodenal ulcer with a clean ulcer  base Recommend Protonix, liquid Carafate  Hypotension BP improved Continue midodrine Held home Lasix, Aldactone for now  ?AKI on Chronic kidney disease, stage 3a Cr rising ?Possibly from dehydration, encouraged to take orally If persistent, may consider IV fluids  Hyponatremia Improved Possibly 2/2 poor oral intake versus cirrhosis S/p IV fluids x 1 day Daily CMP  Hepatic encephalopathy s/p TIPS Negative head CT Ammonia level 67--> 45-->43 Continue rifaximin twice daily, lactulose Daily CMP, ammonia  Pancytopenia Liver cirrhosis secondary to NASH/chronic thrombocytopenia Portal hypertension Continue midodrine 20 mg p.o. 3 times daily resumed  Held Lasix, Aldactone for now   T2DM Last A1c 6.5 Sliding scale insulin Holding lantus for now   Chronic obstructive pulmonary disease Chronic respiratory failure  On 3L of O2 at basline Nebulizers as needed.  Flonase resumed  Parkinson's disease Sinemet resumed  Physical deconditioning Now mostly in the [facility] d/t weakness PT/OT  GOC discussion Palliative/hospice care consulted- switched to DNR Plan for home hospice upon discharge after discussing with patient's wife who is currently in the hospital      Estimated body mass index is 26.31 kg/m as calculated from the following:   Height as of this encounter: 5\' 7"  (1.702 m).   Weight as of this encounter: 76.2 kg.     Code Status: DNR  Family Communication: Son at bedside  Disposition Plan: Status is: Inpatient Remains inpatient appropriate because: Level of care      Consultants: GI  Procedures: EGD  Antimicrobials: None   DVT prophylaxis:  SCDs   Objective: Vitals:   03/04/23 0342 03/04/23 0823 03/04/23 1141 03/04/23 1634  BP: (!) 112/50 (!) 112/57 138/83 (!) 110/50  Pulse:  72 63   Resp:  20 (!) 22 20 20   Temp: 98.2 F (36.8 C) 98.6 F (37 C) 97.7 F (36.5 C) 98.2 F (36.8 C)  TempSrc: Axillary Oral Oral Oral  SpO2:  98% 98%    Weight:      Height:        Intake/Output Summary (Last 24 hours) at 03/04/2023 1812 Last data filed at 03/04/2023 1634 Gross per 24 hour  Intake 968.33 ml  Output 2300 ml  Net -1331.67 ml   Filed Weights   03/01/23 1448  Weight: 76.2 kg    Exam: General: NAD, deconditioned, sleepy Cardiovascular: S1, S2 present Respiratory: CTAB Abdomen: Soft, nontender, nondistended, bowel sounds present Musculoskeletal: No bilateral pedal edema noted. Multiple scabs noted on BLE Skin: As noted above Psychiatry: Fair mood    Data Reviewed: CBC: Recent Labs  Lab 03/01/23 1536 03/01/23 2352 03/02/23 1240 03/02/23 1940 03/02/23 2334 03/03/23 0857 03/04/23 0328  WBC 5.3  --   --   --   --  4.5 4.0  NEUTROABS  --   --   --   --   --  3.5 3.0  HGB 10.2*   < > 9.3* 9.9* 9.5* 10.4* 9.7*  HCT 29.6*   < > 27.2* 28.2* 26.4* 30.1* 27.3*  MCV 101.7*  --   --   --   --  102.7* 99.6  PLT 92*  --   --   --   --  99* 89*   < > = values in this interval not displayed.   Basic Metabolic Panel: Recent Labs  Lab 03/01/23 1536 03/02/23 1940 03/03/23 0857 03/04/23 0328  NA 129* 131* 136 132*  K 4.3 4.4 4.0 4.0  CL 92* 96* 99 96*  CO2 26 31 28 31   GLUCOSE 190* 101* 120* 159*  BUN 38* 31* 31* 25*  CREATININE 1.39* 1.30* 1.33* 1.51*  CALCIUM 10.2 9.5 9.7 9.3   GFR: Estimated Creatinine Clearance: 40.1 mL/min (A) (by C-G formula based on SCr of 1.51 mg/dL (H)). Liver Function Tests: Recent Labs  Lab 03/01/23 1536 03/03/23 0857 03/04/23 0328  AST 56* 69* 58*  ALT 12 8 10   ALKPHOS 94 86 90  BILITOT 1.1 1.6* 1.2*  PROT 6.2* 6.8 6.0*  ALBUMIN 2.5* 2.7* 2.4*   Recent Labs  Lab 03/01/23 1636  LIPASE 25   Recent Labs  Lab 03/01/23 1536 03/03/23 0857 03/04/23 0328  AMMONIA 67* 45* 43*   Coagulation Profile: Recent Labs  Lab 03/01/23 1536 03/03/23 0857 03/04/23 0328  INR 1.3* 1.1 1.2   Cardiac Enzymes: No results for input(s): "CKTOTAL", "CKMB", "CKMBINDEX", "TROPONINI"  in the last 168 hours. BNP (last 3 results) No results for input(s): "PROBNP" in the last 8760 hours. HbA1C: Recent Labs    03/02/23 0129  HGBA1C 6.5*   CBG: Recent Labs  Lab 03/03/23 1618 03/03/23 1927 03/04/23 0617 03/04/23 1140 03/04/23 1633  GLUCAP 205* 177* 175* 223* 191*   Lipid Profile: No results for input(s): "CHOL", "HDL", "LDLCALC", "TRIG", "CHOLHDL", "LDLDIRECT" in the last 72 hours. Thyroid Function Tests: No results for input(s): "TSH", "T4TOTAL", "FREET4", "T3FREE", "THYROIDAB" in the last 72 hours. Anemia Panel: No results for input(s): "VITAMINB12", "FOLATE", "FERRITIN", "TIBC", "IRON", "RETICCTPCT" in the last 72 hours. Urine analysis:    Component Value Date/Time   COLORURINE YELLOW 03/01/2023 2053   APPEARANCEUR CLEAR 03/01/2023 2053   LABSPEC 1.026 03/01/2023 2053   PHURINE 5.0 03/01/2023 2053   GLUCOSEU NEGATIVE 03/01/2023 2053   HGBUR NEGATIVE 03/01/2023 2053  BILIRUBINUR NEGATIVE 03/01/2023 2053   KETONESUR NEGATIVE 03/01/2023 2053   PROTEINUR NEGATIVE 03/01/2023 2053   NITRITE NEGATIVE 03/01/2023 2053   LEUKOCYTESUR NEGATIVE 03/01/2023 2053   Sepsis Labs: @LABRCNTIP (procalcitonin:4,lacticidven:4)  )No results found for this or any previous visit (from the past 240 hour(s)).    Studies: No results found.  Scheduled Meds:  carbidopa-levodopa  2 tablet Oral TID WC   Gerhardt's butt cream   Topical BID   guaiFENesin-codeine  5 mL Oral QHS   insulin aspart  0-5 Units Subcutaneous QHS   insulin aspart  0-9 Units Subcutaneous TID WC   lactulose  20 g Oral TID   midodrine  20 mg Oral TID WC   pantoprazole (PROTONIX) IV  40 mg Intravenous Q12H   rifaximin  550 mg Oral BID   sucralfate  1 g Oral TID   tamsulosin  0.4 mg Oral Daily    Continuous Infusions:     LOS: 2 days     Briant Cedar, MD [facility]  If 7PM-7AM, please contact night-coverage [email or web address] 03/04/2023, 6:12 PM

## 2023-03-04 NOTE — Progress Notes (Signed)
   This pt was referred to hospice care at home. I have reviewed chart and discussed with our MD who has approved pt to go home with hospice care. I have talked with the son, Corey Wood who was at bedside with his dad. He was uncomfortable making any decisions regarding moving forward with hospice care with out his mother present. However, [name] is not able to be reached at this time due to being ill and is in [facility] at this time of [place] speaking.  Corey Wood reports for [place] to follow up with him tomorrow and he will update if she is still in hospital or if we can communicate with her based on how she is feeling. He also reports they have a hospital bed, oxygen, BSC walker, w/c in home already. HE is requesting a hoyer lift when pt goes home. We can order this once we have confirmed interest and goals with the pt's wife, as soon as she is able to speak with [place].   Thank you [name] RN [phone]

## 2023-03-05 DIAGNOSIS — K922 Gastrointestinal hemorrhage, unspecified: Secondary | ICD-10-CM | POA: Diagnosis not present

## 2023-03-05 DIAGNOSIS — R4182 Altered mental status, unspecified: Secondary | ICD-10-CM | POA: Diagnosis not present

## 2023-03-05 DIAGNOSIS — K7460 Unspecified cirrhosis of liver: Secondary | ICD-10-CM | POA: Diagnosis not present

## 2023-03-05 DIAGNOSIS — R188 Other ascites: Secondary | ICD-10-CM | POA: Diagnosis not present

## 2023-03-05 DIAGNOSIS — K3189 Other diseases of stomach and duodenum: Secondary | ICD-10-CM | POA: Diagnosis not present

## 2023-03-05 LAB — COMPREHENSIVE METABOLIC PANEL
ALT: 14 U/L (ref 0–44)
AST: 68 U/L — ABNORMAL HIGH (ref 15–41)
Albumin: 2.4 g/dL — ABNORMAL LOW (ref 3.5–5.0)
Alkaline Phosphatase: 90 U/L (ref 38–126)
Anion gap: 7 (ref 5–15)
BUN: 21 mg/dL (ref 8–23)
CO2: 30 mmol/L (ref 22–32)
Calcium: 9.7 mg/dL (ref 8.9–10.3)
Chloride: 97 mmol/L — ABNORMAL LOW (ref 98–111)
Creatinine, Ser: 1.39 mg/dL — ABNORMAL HIGH (ref 0.61–1.24)
GFR, Estimated: 53 mL/min — ABNORMAL LOW (ref >60)
Glucose, Bld: 196 mg/dL — ABNORMAL HIGH (ref 70–99)
Potassium: 4.4 mmol/L (ref 3.5–5.1)
Sodium: 134 mmol/L — ABNORMAL LOW (ref 135–145)
Total Bilirubin: 1.4 mg/dL — ABNORMAL HIGH (ref <1.2)
Total Protein: 6 g/dL — ABNORMAL LOW (ref 6.5–8.1)

## 2023-03-05 LAB — GLUCOSE, CAPILLARY
Glucose-Capillary: 170 mg/dL — ABNORMAL HIGH (ref 70–99)
Glucose-Capillary: 177 mg/dL — ABNORMAL HIGH (ref 70–99)

## 2023-03-05 LAB — CBC WITH DIFFERENTIAL/PLATELET
Abs Immature Granulocytes: 0.06 10*3/uL (ref 0.00–0.07)
Basophils Absolute: 0 10*3/uL (ref 0.0–0.1)
Basophils Relative: 1 %
Eosinophils Absolute: 0.1 10*3/uL (ref 0.0–0.5)
Eosinophils Relative: 2 %
HCT: 27.7 % — ABNORMAL LOW (ref 39.0–52.0)
Hemoglobin: 9.8 g/dL — ABNORMAL LOW (ref 13.0–17.0)
Immature Granulocytes: 1 %
Lymphocytes Relative: 15 %
Lymphs Abs: 0.7 10*3/uL (ref 0.7–4.0)
MCH: 34.9 pg — ABNORMAL HIGH (ref 26.0–34.0)
MCHC: 35.4 g/dL (ref 30.0–36.0)
MCV: 98.6 fL (ref 80.0–100.0)
Monocytes Absolute: 0.3 10*3/uL (ref 0.1–1.0)
Monocytes Relative: 7 %
Neutro Abs: 3.3 10*3/uL (ref 1.7–7.7)
Neutrophils Relative %: 74 %
Platelets: 89 10*3/uL — ABNORMAL LOW (ref 150–400)
RBC: 2.81 MIL/uL — ABNORMAL LOW (ref 4.22–5.81)
RDW: 14.7 % (ref 11.5–15.5)
WBC: 4.4 10*3/uL (ref 4.0–10.5)
nRBC: 0 % (ref 0.0–0.2)

## 2023-03-05 LAB — PROTIME-INR
INR: 1.2 (ref 0.8–1.2)
Prothrombin Time: 15.3 s — ABNORMAL HIGH (ref 11.4–15.2)

## 2023-03-05 MED ORDER — FUROSEMIDE 20 MG PO TABS: 20.0000 mg | ORAL_TABLET | Freq: Every day | ORAL | Status: DC

## 2023-03-05 MED ORDER — SPIRONOLACTONE 100 MG PO TABS: 100.0000 mg | ORAL_TABLET | Freq: Every day | ORAL | Status: DC

## 2023-03-05 MED ORDER — LACTULOSE 10 GM/15ML PO SOLN: 30.0000 g | Freq: Four times a day (QID) | ORAL | Status: DC

## 2023-03-05 MED ORDER — ORAL CARE MOUTH RINSE: 15.0000 mL | OROMUCOSAL | Status: DC | PRN

## 2023-03-05 MED ORDER — SUCRALFATE 1 GM/10ML PO SUSP: 1.0000 g | Freq: Three times a day (TID) | ORAL | 0 refills | Status: DC

## 2023-03-05 NOTE — Plan of Care (Signed)
Plan of care is reviewed. Pt has been progressing. He is alert, oriented x3, cooperative, afebrile, sinus rhythm on the monitor, stable hemodynamically, chronically dependent on 3 LPM of O2 NCL, SPO2 95-99%, No complaints, no acute distress noted overnight.   Pt has frequent loose stools from getting lactulose. No black or tarry stool tonight, mostly brown and dark green. Skin integrity is compromised due to moisture associated skin damaged around rectal and sacral area. Skin barrier Gerhardt's cream is applied, with sacra foam and keep skin dry and clean. Position changed q 2 hr is encouraged. We will monitor.   Problem: Fluid Volume: Goal: Ability to maintain a balanced intake and output will improve Outcome: Progressing   Problem: Health Behavior/Discharge Planning: Goal: Ability to identify and utilize available resources and services will improve Outcome: Progressing Goal: Ability to manage health-related needs will improve Outcome: Progressing   Problem: Metabolic: Goal: Ability to maintain appropriate glucose levels will improve Outcome: Progressing   Problem: Skin Integrity: Goal: Risk for impaired skin integrity will decrease Outcome: Progressing   Filiberto Pinks, RN

## 2023-03-05 NOTE — Discharge Summary (Signed)
Physician Discharge Summary  Corey Wood UXL:[id number] DOB: Nov 18, 1950 DOA: 03/01/2023  PCP: [facility]  Admit date: 03/01/2023 Discharge date: 03/05/2023  Admitted From: home Discharge disposition: home with hospice   Recommendations for Outpatient Follow-Up:   Hospice at home Titrate lactulose for 3 BMs/day   Discharge Diagnosis:   Principal Problem:   GI bleeding Active Problems:   Encephalopathy, hepatic (HCC)   Other cirrhosis of liver (HCC)   Type 2 diabetes mellitus with stage 3a chronic kidney disease, with long-term current use of insulin (HCC)   Chronic kidney disease, stage 3a (HCC)   Parkinson's disease (HCC)   Chronic obstructive pulmonary disease (HCC)   Bleeding esophageal varices (HCC)   Anemia   Portal hypertension (HCC)   S/P TIPS (transjugular intrahepatic portosystemic shunt)   GI bleed   Melena   Acute esophagitis    Discharge Condition: Improved.  Diet recommendation:  Regular.  Wound care: None.  Code status: Full.   History of Present Illness:   72 year old male with past medical history of COPD, anxiety depression, T2DM, HLD, HTN, hypothyroidism, neuromuscular disorder, OSA, chronic oxygen dependency, history of stroke, cirrhosis secondary to NASH, history of variceal bleeding, hepatic hydrothorax, portal hypertensive, gastritis intolerant to beta-blockade due to bradycardia and hypotension.  He is sp EGD with banding and TIPS. Presents to the ED due to dark stools/melena with confusion X 2 days PTA. In the ER patient noted borderline hypotension.  Vitals otherwise stable.  Patient's occult stool positive.  Sodium 129, albumin 2.5, ammonia 67, hemoglobin 10.2, previous hemoglobin 12.5 in 11/2022], platelets 92 [chronic], INR 1.3, CXR redemonstrates B/L pleural effusions L>R. GI contacted by EDP recommended Protonix and octreotide infusions.  Patient admitted for further management. Family deciding on  hospice   Hospital Course by Problem:   Possible GI bleeding FOBT positive Acute on chronic anemia No further reports of melena GI on board, s/p EGD, which showed severe esophagitis with no bleeding, previously noted large varices were decompressed not visualized post TIPS, no gastric varices, nonbleeding duodenal ulcer with a clean ulcer base Recs by GI: increase lactulose to 20gm with each dose at minum, and one of the doses per day should be 30gm. He is dosed TID - should be at least 20gm or 30gm based on his BMs, goal 3 BMs per day. Continue rifaximin. Increase protonix to 40mg  BID at discharge (was on once daily before) and give him liquid carafate 10cc every 8 hours TID for 2 weeks.    Hypotension BP improved Continue midodrine   ?AKI on Chronic kidney disease, stage 3a Cr trending down -encourage PO intake   Hyponatremia Improved     Hepatic encephalopathy s/p TIPS Negative head CT -see above meds   Pancytopenia Liver cirrhosis secondary to NASH/chronic thrombocytopenia Portal hypertension Continue midodrine 20 mg p.o. 3 times daily resumed  -resume lasix/aldactone in a few days if able   T2DM Last A1c 6.5 -resume home meds-- adjust based on intake   Chronic obstructive pulmonary disease Chronic respiratory failure  On 3L of O2 at basline Nebulizers as needed.  Flonase resumed   Parkinson's disease Sinemet resumed   Physical deconditioning Now mostly in the [facility] d/t weakness   GOC discussion Palliative/hospice care consulted- switched to DNR Plan for home hospice -- family would like to be d/c;d today        Medical Consultants:   GI   Discharge Exam:   Vitals:   03/05/23 0943 03/05/23 1206  BP: (!) 103/41   Pulse: 97   Resp: 20 16  Temp: 98 F (36.7 C) 98.5 F (36.9 C)  SpO2: 96%    Vitals:   03/04/23 2358 03/05/23 0345 03/05/23 0943 03/05/23 1206  BP: 108/76 126/66 (!) 103/41   Pulse: 85 62 97   Resp: 20 14 20 16   Temp: 98.1 F  (36.7 C) 97.9 F (36.6 C) 98 F (36.7 C) 98.5 F (36.9 C)  TempSrc: Oral Oral Axillary Axillary  SpO2: 99% 99% 96%   Weight:      Height:        General exam: Appears calm and comfortable.    The results of significant diagnostics from this hospitalization (including imaging, microbiology, ancillary and laboratory) are listed below for reference.     Procedures and Diagnostic Studies:   CT ABDOMEN PELVIS W CONTRAST  Result Date: 03/01/2023 CLINICAL DATA:  Abdominal pain. EXAM: CT ABDOMEN AND PELVIS WITH CONTRAST TECHNIQUE: Multidetector CT imaging of the abdomen and pelvis was performed using the standard protocol following bolus administration of intravenous contrast. RADIATION DOSE REDUCTION: This exam was performed according to the departmental dose-optimization program which includes automated exposure control, adjustment of the mA and/or kV according to patient size and/or use of iterative reconstruction technique. CONTRAST:  75mL OMNIPAQUE IOHEXOL 350 MG/ML SOLN COMPARISON:  CT of the chest abdomen pelvis dated 04/01/2022. FINDINGS: Lower chest: Partially visualized small bilateral pleural effusions. There are consolidative changes of the visualized lower lobes which may represent atelectasis or infiltrate. No intra-abdominal free air or free fluid. Hepatobiliary: Cirrhosis.  No biliary dilatation.  Cholecystectomy. Pancreas: Unremarkable. No pancreatic ductal dilatation or surrounding inflammatory changes. Spleen: Splenomegaly measuring 16 cm in length. Adrenals/Urinary Tract: The adrenal glands unremarkable. Severe chronic left hydronephrosis and left renal parenchyma atrophy. There is a 5 mm nonobstructing stone in the interpolar left kidney. There is a 6 mm stone in the distal left ureter with moderate left hydroureter. Punctate nonobstructing right renal upper pole calculus. No hydronephrosis on the right. The right ureter is unremarkable. The urinary bladder is minimally distended.  There is diffuse thickening of the anterior bladder wall with mild trabeculation which may be related to chronic bladder outlet obstruction. Correlation with urinalysis recommended to exclude UTI. Stomach/Bowel: Small hiatal hernia. Mild thickened appearance of the distal esophagus may represent esophagitis related to reflux. Clinical correlation recommended. There is no bowel obstruction. The appendix is normal. Vascular/Lymphatic: Mild aortoiliac atherosclerotic disease. The IVC is unremarkable. A TIPS is noted. The SMV, splenic vein, and main portal vein appear patent. No portal venous gas. No adenopathy. Reproductive: The prostate and seminal vesicles are grossly unremarkable. No pelvic mass. Other: None Musculoskeletal: Osteopenia with degenerative changes. Age indeterminate, chronic appearing compression fracture of the inferior endplate of T12. No acute osseous pathology. IMPRESSION: 1. A 6 mm stone in the distal left ureter with moderate left hydroureter. 2. Severe chronic left hydronephrosis and left renal parenchyma atrophy. 3. Nonobstructing bilateral renal calculi. No hydronephrosis on the right. 4. Cirrhosis with splenomegaly. 5. Partially visualized small bilateral pleural effusions with bibasilar atelectasis or infiltrate. 6.  Aortic Atherosclerosis (ICD10-I70.0). Electronically Signed   By: Elgie Collard M.D.   On: 03/01/2023 20:10   CT Head Wo Contrast  Result Date: 03/01/2023 CLINICAL DATA:  Altered mental status EXAM: CT HEAD WITHOUT CONTRAST TECHNIQUE: Contiguous axial images were obtained from the base of the skull through the vertex without intravenous contrast. RADIATION DOSE REDUCTION: This exam was performed according to the departmental dose-optimization  program which includes automated exposure control, adjustment of the mA and/or kV according to patient size and/or use of iterative reconstruction technique. COMPARISON:  02/01/2023 FINDINGS: Brain: No evidence of acute infarction,  hemorrhage, mass, mass effect, or midline shift. No hydrocephalus or extra-axial fluid collection. Basal ganglia calcifications. Age related cerebral atrophy. Periventricular white matter changes, likely the sequela of chronic small vessel ischemic disease. Vascular: No hyperdense vessel. Skull: Negative for fracture or focal lesion. Sinuses/Orbits: No acute finding. Status post bilateral lens replacements. Other: Trace fluid in the left mastoid air cells. IMPRESSION: No acute intracranial process. Electronically Signed   By: Wiliam Ke M.D.   On: 03/01/2023 19:21   DG Chest Portable 1 View  Result Date: 03/01/2023 CLINICAL DATA:  Weakness.  Altered mental status. EXAM: PORTABLE CHEST 1 VIEW COMPARISON:  01/12/2023. FINDINGS: Redemonstration of small-to-moderate left pleural effusion with associated compressive atelectatic changes in the left lung. There is trace right pleural effusion, slightly decreased since the prior study. Bilateral lung fields are otherwise clear. No acute consolidation or lung collapse. No pneumothorax. No pulmonary edema. Stable cardio-mediastinal silhouette. No acute osseous abnormalities. The soft tissues are within normal limits. IMPRESSION: *Redemonstration of bilateral pleural effusions, left-greater-than-right. No acute consolidation or lung collapse. Electronically Signed   By: Jules Schick M.D.   On: 03/01/2023 16:02     Labs:   Basic Metabolic Panel: Recent Labs  Lab 03/01/23 1536 03/02/23 1940 03/03/23 0857 03/04/23 0328 03/05/23 0249  NA 129* 131* 136 132* 134*  K 4.3 4.4 4.0 4.0 4.4  CL 92* 96* 99 96* 97*  CO2 26 31 28 31 30   GLUCOSE 190* 101* 120* 159* 196*  BUN 38* 31* 31* 25* 21  CREATININE 1.39* 1.30* 1.33* 1.51* 1.39*  CALCIUM 10.2 9.5 9.7 9.3 9.7   GFR Estimated Creatinine Clearance: 43.6 mL/min (A) (by C-G formula based on SCr of 1.39 mg/dL (H)). Liver Function Tests: Recent Labs  Lab 03/01/23 1536 03/03/23 0857 03/04/23 0328  03/05/23 0249  AST 56* 69* 58* 68*  ALT 12 8 10 14   ALKPHOS 94 86 90 90  BILITOT 1.1 1.6* 1.2* 1.4*  PROT 6.2* 6.8 6.0* 6.0*  ALBUMIN 2.5* 2.7* 2.4* 2.4*   Recent Labs  Lab 03/01/23 1636  LIPASE 25   Recent Labs  Lab 03/01/23 1536 03/03/23 0857 03/04/23 0328  AMMONIA 67* 45* 43*   Coagulation profile Recent Labs  Lab 03/01/23 1536 03/03/23 0857 03/04/23 0328 03/05/23 0249  INR 1.3* 1.1 1.2 1.2    CBC: Recent Labs  Lab 03/01/23 1536 03/01/23 2352 03/02/23 1940 03/02/23 2334 03/03/23 0857 03/04/23 0328 03/05/23 0249  WBC 5.3  --   --   --  4.5 4.0 4.4  NEUTROABS  --   --   --   --  3.5 3.0 3.3  HGB 10.2*   < > 9.9* 9.5* 10.4* 9.7* 9.8*  HCT 29.6*   < > 28.2* 26.4* 30.1* 27.3* 27.7*  MCV 101.7*  --   --   --  102.7* 99.6 98.6  PLT 92*  --   --   --  99* 89* 89*   < > = values in this interval not displayed.   Cardiac Enzymes: No results for input(s): "CKTOTAL", "CKMB", "CKMBINDEX", "TROPONINI" in the last 168 hours. BNP: Invalid input(s): "POCBNP" CBG: Recent Labs  Lab 03/04/23 1140 03/04/23 1633 03/04/23 2105 03/05/23 0614 03/05/23 1201  GLUCAP 223* 191* 139* 170* 177*   D-Dimer No results for input(s): "DDIMER" in the  last 72 hours. Hgb A1c No results for input(s): "HGBA1C" in the last 72 hours. Lipid Profile No results for input(s): "CHOL", "HDL", "LDLCALC", "TRIG", "CHOLHDL", "LDLDIRECT" in the last 72 hours. Thyroid function studies No results for input(s): "TSH", "T4TOTAL", "T3FREE", "THYROIDAB" in the last 72 hours.  Invalid input(s): "FREET3" Anemia work up No results for input(s): "VITAMINB12", "FOLATE", "FERRITIN", "TIBC", "IRON", "RETICCTPCT" in the last 72 hours. Microbiology No results found for this or any previous visit (from the past 240 hour(s)).   Discharge Instructions:   Discharge Instructions     Diet general   Complete by: As directed    Discharge instructions   Complete by: As directed    titrate lactulose to 3  BMs per day   Discharge wound care:   Complete by: As directed    Apply  cream to bilat buttocks BID and PRN when turning and cleaning.   Increase activity slowly   Complete by: As directed       Allergies as of 03/05/2023       Reactions   Lisinopril Cough        Medication List     STOP taking these medications    Basaglar KwikPen 100 UNIT/ML   ferrous sulfate 325 (65 FE) MG EC tablet       TAKE these medications    acetaminophen 325 MG tablet Commonly known as: TYLENOL Take 325 mg by mouth every 6 (six) hours as needed for moderate pain.   albuterol 108 (90 Base) MCG/ACT inhaler Commonly known as: VENTOLIN HFA Inhale 1 puff into the lungs every 6 (six) hours as needed for wheezing or shortness of breath.   Boudreauxs Butt Paste 40 % ointment Generic drug: liver oil-zinc oxide Apply topically.   carbidopa-levodopa 25-100 MG tablet Commonly known as: SINEMET IR Take 2 tablets by mouth in the morning, at noon, and at bedtime.   DIABETIC TUSSIN EX 100 MG/5ML liquid Generic drug: guaiFENesin Take 10 mLs by mouth at bedtime. Use with guaifenesin-codeine at bedtime   DULoxetine 20 MG capsule Commonly known as: CYMBALTA Take 20 mg by mouth daily.   fexofenadine 180 MG tablet Commonly known as: ALLEGRA Take by mouth.   fluticasone 50 MCG/ACT nasal spray Commonly known as: FLONASE Place 2 sprays into both nostrils at bedtime.   furosemide 20 MG tablet Commonly known as: LASIX Take 1 tablet (20 mg total) by mouth daily. Start taking on: March 07, 2023 What changed: These instructions start on March 07, 2023. If you are unsure what to do until then, ask your doctor or other care provider.   glucose 4 GM chewable tablet Chew 4 tablets by mouth as needed for low blood sugar.   guaiFENesin-codeine 100-10 MG/5ML syrup Take 5 mLs by mouth at bedtime. Use with diabetic tussin   lactulose 10 GM/15ML solution Commonly known as: Constulose Take 45 mLs (30  g total) by mouth 4 (four) times daily. Titrate lactulose for 3 BMs/day What changed:  how much to take when to take this additional instructions   latanoprost 0.005 % ophthalmic solution Commonly known as: XALATAN Place 1 drop into both eyes at bedtime.   midodrine 10 MG tablet Commonly known as: PROAMATINE Take 20 mg by mouth 3 (three) times daily.   montelukast 10 MG tablet Commonly known as: SINGULAIR Take 10 mg by mouth at bedtime.   multivitamin with minerals Tabs tablet Take 1 tablet by mouth daily.   NovoLOG FlexPen 100 UNIT/ML FlexPen Generic drug: insulin aspart  Inject 3 Units into the skin as needed for high blood sugar.   OXYGEN Inhale 3 L into the lungs daily.   pantoprazole 40 MG tablet Commonly known as: PROTONIX Take 40 mg by mouth daily with breakfast.   rifaximin 550 MG Tabs tablet Commonly known as: XIFAXAN Take 1 tablet (550 mg total) by mouth 2 (two) times daily.   spironolactone 100 MG tablet Commonly known as: ALDACTONE Take 1 tablet (100 mg total) by mouth daily. Start taking on: March 07, 2023 What changed: These instructions start on March 07, 2023. If you are unsure what to do until then, ask your doctor or other care provider.   sucralfate 1 GM/10ML suspension Commonly known as: CARAFATE Take 10 mLs (1 g total) by mouth 3 (three) times daily.   tamsulosin 0.4 MG Caps capsule Commonly known as: FLOMAX Take 0.4 mg by mouth in the morning and at bedtime.   traZODone 150 MG tablet Commonly known as: DESYREL Take 75 mg by mouth at bedtime as needed for sleep.   vitamin A & D ointment Apply 1 Application topically as needed for dry skin (Use on buttocks).               Discharge Care Instructions  (From admission, onward)           Start     Ordered   03/05/23 0000  Discharge wound care:       Comments: Apply  cream to bilat buttocks BID and PRN when turning and cleaning.   03/05/23 1358            Follow-up  Information     [place] Follow up.   Specialty: Home Health Services Why: Home Hospice referral made Contact information: [address] [place] [address] [phone]                  Time coordinating discharge: 45 min  Signed:  Joseph Art DO  [facility] 03/05/2023, 1:58 PM

## 2023-03-05 NOTE — Progress Notes (Signed)
Progress Note   Subjective  Patient is much more alert this AM, responding to questioning. Per nursing had a BM overnight.    Objective   Vital signs in last 24 hours: Temp:  [97.7 F (36.5 C)-98.3 F (36.8 C)] 98 F (36.7 C) (12/07 0943) Pulse Rate:  [62-97] 97 (12/07 0943) Resp:  [14-20] 20 (12/07 0943) BP: (103-138)/(41-83) 103/41 (12/07 0943) SpO2:  [96 %-100 %] 96 % (12/07 0943) Last BM Date : 03/05/23 General:    white male in NAD Neurologic:  Alert and oriented Psych:  Cooperative. Normal mood and affect.  Intake/Output from previous day: 12/06 0701 - 12/07 0700 In: 1218.3 [P.O.:690; I.V.:528.3] Out: 1800 [Urine:1800] Intake/Output this shift: No intake/output data recorded.  Lab Results: Recent Labs    03/03/23 0857 03/04/23 0328 03/05/23 0249  WBC 4.5 4.0 4.4  HGB 10.4* 9.7* 9.8*  HCT 30.1* 27.3* 27.7*  PLT 99* 89* 89*   BMET Recent Labs    03/03/23 0857 03/04/23 0328 03/05/23 0249  NA 136 132* 134*  K 4.0 4.0 4.4  CL 99 96* 97*  CO2 28 31 30   GLUCOSE 120* 159* 196*  BUN 31* 25* 21  CREATININE 1.33* 1.51* 1.39*  CALCIUM 9.7 9.3 9.7   LFT Recent Labs    03/05/23 0249  PROT 6.0*  ALBUMIN 2.4*  AST 68*  ALT 14  ALKPHOS 90  BILITOT 1.4*   PT/INR Recent Labs    03/04/23 0328 03/05/23 0249  LABPROT 15.5* 15.3*  INR 1.2 1.2    Studies/Results: No results found.     Assessment / Plan:    72 y/o male here with the following:  Hepatic encephalopathy s/p TIPS Decompensated MASH cirrhosis History of melena / anemia   Mental status continue to improve from admission and appears back at baseline today. Another lactulose enema given yesterday and I increased his dosing of oral lactulose with each dose he gets (15mg  per dose to 20mg  per dose - TID). He had a BM overnight. Stable at this time.   Otherwise, EGD done 12/5 for dark stools. He had pretty severe esophagitis / esophageal ulceration, biopsy taken to rule out viral  etiologies, that remains pending. Increased protonix to 40mg  BID and added liquid carafate every 8 hours for the next 2 weeks to help heal this. Fortunately his varices have been eradicated post TIPS. He has not had any bleeding while he has been here.   Overall, I think he is stable for discharge home later today if his family is okay with that. If not and they think he needs another day could do tomorrow, his wife is coming to see him this afternoon and she will decide. Looks like they are arranging home hospice as well. I will coordinate outpatient follow up with me.   He will continue Rifaximin but recommend he take at least 20mg  / day for 2 doses of the day, and could do 30mg  for one dose per day - titrate up for goal 3 BMS per day. I think he was underdosed before given the amount of BMs he was having.   Will await his course today, assess for possible discharge home tomorrow.   PLAN: - increase oral lactulose dosing - TID dosing, at least 20mg  / dose, would do at least one 30mg  / dose per day - titrate to 3 BMs per day - continue rifaximin - protonix 40mg  BID - liquid carafate TID for 2 weeks - call with questions,  will coordinate outpatient follow up  Harlin Rain, MD [facility]

## 2023-03-05 NOTE — Progress Notes (Signed)
PROGRESS NOTE  Corey Wood ZOX:[id number] DOB: [birth year] DOA: 03/01/2023 PCP: [facility]  HPI/Recap of past 13 hours: 72 year old male with past medical history of COPD, anxiety depression, T2DM, HLD, HTN, hypothyroidism, neuromuscular disorder, OSA, chronic oxygen dependency, history of stroke, cirrhosis secondary to NASH, history of variceal bleeding, hepatic hydrothorax, portal hypertensive, gastritis intolerant to beta-blockade due to bradycardia and hypotension.  He is sp EGD with banding and TIPS. Presents to the ED due to dark stools/melena with confusion X 2 days PTA. In the ER patient noted borderline hypotension.  Vitals otherwise stable.  Patient's occult stool positive.  Sodium 129, albumin 2.5, ammonia 67, hemoglobin 10.2, previous hemoglobin 12.5 in 11/2022], platelets 92 [chronic], INR 1.3, CXR redemonstrates B/L pleural effusions L>R. GI contacted by EDP recommended Protonix and octreotide infusions.  Patient admitted for further management. Family deciding on hospice    Assessment/Plan: Principal Problem:   GI bleeding Active Problems:   Encephalopathy, hepatic (HCC)   Other cirrhosis of liver (HCC)   Type 2 diabetes mellitus with stage 3a chronic kidney disease, with long-term current use of insulin (HCC)   Chronic kidney disease, stage 3a (HCC)   Parkinson's disease (HCC)   Chronic obstructive pulmonary disease (HCC)   Bleeding esophageal varices (HCC)   Anemia   Portal hypertension (HCC)   S/P TIPS (transjugular intrahepatic portosystemic shunt)   GI bleed   Melena   Acute esophagitis   Possible GI bleeding FOBT positive Acute on chronic anemia No further reports of melena GI on board, s/p EGD, which showed severe esophagitis with no bleeding, previously noted large varices were decompressed not visualized post TIPS, no gastric varices, nonbleeding duodenal ulcer with a clean ulcer base Recs by GI: increase lactulose to 20gm with each dose at  minum, and one of the doses per day should be 30gm. He is dosed TID - should be at least 20gm or 30gm based on his BMs, goal 3 BMs per day. Continue rifaximin. Increase protonix to 40mg  BID at discharge (was on once daily before) and give him liquid carafate 10cc every 8 hours TID for 2 weeks.   Hypotension BP improved Continue midodrine Held home Lasix, Aldactone for now  ?AKI on Chronic kidney disease, stage 3a Cr trending down -encourage PO intake  Hyponatremia Improved   Hepatic encephalopathy s/p TIPS Negative head CT Ammonia level 67--> 45-->43 -see above meds  Pancytopenia Liver cirrhosis secondary to NASH/chronic thrombocytopenia Portal hypertension Continue midodrine 20 mg p.o. 3 times daily resumed  Held Lasix, Aldactone for now   T2DM Last A1c 6.5 Sliding scale insulin Holding lantus for now   Chronic obstructive pulmonary disease Chronic respiratory failure  On 3L of O2 at basline Nebulizers as needed.  Flonase resumed  Parkinson's disease Sinemet resumed  Physical deconditioning Now mostly in the [facility] d/t weakness PT/OT  GOC discussion Palliative/hospice care consulted- switched to DNR Plan for home hospice upon discharge after discussing with patient's wife who is currently in the hospital-- await TOC arrangement     Code Status: DNR  Family Communication: Son at bedside  Disposition Plan: Status is: Inpatient Remains inpatient appropriate because: Level of care  home with hospice 12/7 or 12/8      Consultants: GI  Procedures: EGD   DVT prophylaxis:  SCDs   Objective: Vitals:   03/04/23 2023 03/04/23 2358 03/05/23 0345 03/05/23 0943  BP: 129/69 108/76 126/66 (!) 103/41  Pulse: 79 85 62 97  Resp: 20 20 14 20   Temp:  98.3 F (36.8 C) 98.1 F (36.7 C) 97.9 F (36.6 C) 98 F (36.7 C)  TempSrc: Oral Oral Oral Axillary  SpO2: 100% 99% 99% 96%  Weight:      Height:        Intake/Output Summary (Last 24 hours) at 03/05/2023  1121 Last data filed at 03/05/2023 0600 Gross per 24 hour  Intake 1018.33 ml  Output 1200 ml  Net -181.67 ml   Filed Weights   03/01/23 1448  Weight: 76.2 kg    Exam:  General: Appearance:     Overweight male in no acute distress     Lungs:     respirations unlabored  Heart:    Normal heart rate. Normal rhythm. No murmurs, rubs, or gallops.   MS:   All extremities are intact.   Neurologic:   Awake, alert       Data Reviewed: CBC: Recent Labs  Lab 03/01/23 1536 03/01/23 2352 03/02/23 1940 03/02/23 2334 03/03/23 0857 03/04/23 0328 03/05/23 0249  WBC 5.3  --   --   --  4.5 4.0 4.4  NEUTROABS  --   --   --   --  3.5 3.0 3.3  HGB 10.2*   < > 9.9* 9.5* 10.4* 9.7* 9.8*  HCT 29.6*   < > 28.2* 26.4* 30.1* 27.3* 27.7*  MCV 101.7*  --   --   --  102.7* 99.6 98.6  PLT 92*  --   --   --  99* 89* 89*   < > = values in this interval not displayed.   Basic Metabolic Panel: Recent Labs  Lab 03/01/23 1536 03/02/23 1940 03/03/23 0857 03/04/23 0328 03/05/23 0249  NA 129* 131* 136 132* 134*  K 4.3 4.4 4.0 4.0 4.4  CL 92* 96* 99 96* 97*  CO2 26 31 28 31 30   GLUCOSE 190* 101* 120* 159* 196*  BUN 38* 31* 31* 25* 21  CREATININE 1.39* 1.30* 1.33* 1.51* 1.39*  CALCIUM 10.2 9.5 9.7 9.3 9.7   GFR: Estimated Creatinine Clearance: 43.6 mL/min (A) (by C-G formula based on SCr of 1.39 mg/dL (H)). Liver Function Tests: Recent Labs  Lab 03/01/23 1536 03/03/23 0857 03/04/23 0328 03/05/23 0249  AST 56* 69* 58* 68*  ALT 12 8 10 14   ALKPHOS 94 86 90 90  BILITOT 1.1 1.6* 1.2* 1.4*  PROT 6.2* 6.8 6.0* 6.0*  ALBUMIN 2.5* 2.7* 2.4* 2.4*   Recent Labs  Lab 03/01/23 1636  LIPASE 25   Recent Labs  Lab 03/01/23 1536 03/03/23 0857 03/04/23 0328  AMMONIA 67* 45* 43*   Coagulation Profile: Recent Labs  Lab 03/01/23 1536 03/03/23 0857 03/04/23 0328 03/05/23 0249  INR 1.3* 1.1 1.2 1.2   Cardiac Enzymes: No results for input(s): "CKTOTAL", "CKMB", "CKMBINDEX", "TROPONINI"  in the last 168 hours. BNP (last 3 results) No results for input(s): "PROBNP" in the last 8760 hours. HbA1C: No results for input(s): "HGBA1C" in the last 72 hours.  CBG: Recent Labs  Lab 03/04/23 0617 03/04/23 1140 03/04/23 1633 03/04/23 2105 03/05/23 0614  GLUCAP 175* 223* 191* 139* 170*   Lipid Profile: No results for input(s): "CHOL", "HDL", "LDLCALC", "TRIG", "CHOLHDL", "LDLDIRECT" in the last 72 hours. Thyroid Function Tests: No results for input(s): "TSH", "T4TOTAL", "FREET4", "T3FREE", "THYROIDAB" in the last 72 hours. Anemia Panel: No results for input(s): "VITAMINB12", "FOLATE", "FERRITIN", "TIBC", "IRON", "RETICCTPCT" in the last 72 hours. Urine analysis:    Component Value Date/Time   COLORURINE YELLOW 03/01/2023 2053   APPEARANCEUR  CLEAR 03/01/2023 2053   LABSPEC 1.026 03/01/2023 2053   PHURINE 5.0 03/01/2023 2053   GLUCOSEU NEGATIVE 03/01/2023 2053   HGBUR NEGATIVE 03/01/2023 2053   BILIRUBINUR NEGATIVE 03/01/2023 2053   KETONESUR NEGATIVE 03/01/2023 2053   PROTEINUR NEGATIVE 03/01/2023 2053   NITRITE NEGATIVE 03/01/2023 2053   LEUKOCYTESUR NEGATIVE 03/01/2023 2053     )No results found for this or any previous visit (from the past 240 hour(s)).    Studies: No results found.  Scheduled Meds:  carbidopa-levodopa  2 tablet Oral TID WC   Gerhardt's butt cream   Topical BID   guaiFENesin-codeine  5 mL Oral QHS   insulin aspart  0-5 Units Subcutaneous QHS   insulin aspart  0-9 Units Subcutaneous TID WC   lactulose  20 g Oral TID   midodrine  20 mg Oral TID WC   pantoprazole (PROTONIX) IV  40 mg Intravenous Q12H   rifaximin  550 mg Oral BID   sucralfate  1 g Oral TID   tamsulosin  0.4 mg Oral Daily    Continuous Infusions:     LOS: 3 days     Joseph Art, DO [facility]  If 7PM-7AM, please contact night-coverage [email or web address] 03/05/2023, 11:21 AM

## 2023-03-07 ENCOUNTER — Encounter (HOSPITAL_COMMUNITY): Payer: Self-pay | Admitting: Gastroenterology

## 2023-03-08 LAB — SURGICAL PATHOLOGY

## 2023-03-10 ENCOUNTER — Telehealth: Payer: Self-pay

## 2023-03-10 NOTE — Telephone Encounter (Signed)
Patient is scheduled in March

## 2023-03-10 NOTE — Telephone Encounter (Signed)
-----   Message from Benancio Deeds sent at 03/05/2023 11:26 AM EST ----- Regarding: outpatient follow up Needs outpatient follow up with me for cirrhosis in a few months.thanks

## 2023-03-14 ENCOUNTER — Telehealth: Payer: Self-pay

## 2023-03-14 NOTE — Telephone Encounter (Signed)
-----   Message from Benancio Deeds sent at 03/14/2023  1:53 PM EST ----- Regarding: RE: ruq U/S You may have to ask Mrs. Schuenemann. I saw him in the hospital and they have transitioned to hospice. If that is the case, I am not sure how aggressive they want to be with the imaging right now. Thanks ----- Message ----- From: Cooper Render, CMA Sent: 03/14/2023  11:41 AM EST To: Benancio Deeds, MD Subject: FW: ruq U/S                                    Patient had recent CT Abd and Pelvis.  Do you still want a RUQ U/S in January? Thanks ----- Message ----- From: Cooper Render, CMA Sent: 03/14/2023  12:00 AM EST To: Cooper Render, CMA Subject: ruq U/S                                        Due for RUQ U/S due around 03-30-23  cirrhosis

## 2023-03-14 NOTE — Telephone Encounter (Signed)
Called patient's wife, [name]. Unable to leave a message as her VM is full.

## 2023-03-16 NOTE — Telephone Encounter (Signed)
Called and spoke to patient's wife, [name]. He is no longer eating and is non communicative.  Hospice has been there and he is on morphine and is resting peacefully. She tearfully asked that I thank Dr. Adela Lank for all his help.  They both admired and appreciated all the help he's given and his straightforwardness and kindness. She said she would let us know if there was anything we could do for them. Very sad situation.

## 2023-03-16 NOTE — Telephone Encounter (Signed)
So sorry to hear this Jan, appreciate you calling me and letting me know. Very sad. I am glad he is comfortable.

## 2023-03-30 DEATH — deceased

## 2023-05-31 ENCOUNTER — Ambulatory Visit: Payer: No Typology Code available for payment source | Admitting: Gastroenterology

## 2023-06-02 ENCOUNTER — Ambulatory Visit: Payer: No Typology Code available for payment source | Admitting: Gastroenterology
